# Patient Record
Sex: Female | Born: 1945 | Race: Black or African American | Hispanic: No | Smoking: Former smoker
Health system: Southern US, Community
[De-identification: ages and names within clinical notes are randomized; demographics above are authoritative.]

## PROBLEM LIST (undated history)

## (undated) DIAGNOSIS — N183 Chronic kidney disease, stage 3 unspecified: Secondary | ICD-10-CM

## (undated) DIAGNOSIS — M545 Low back pain, unspecified: Secondary | ICD-10-CM

## (undated) DIAGNOSIS — Z9289 Personal history of other medical treatment: Secondary | ICD-10-CM

## (undated) DIAGNOSIS — I10 Essential (primary) hypertension: Secondary | ICD-10-CM

## (undated) DIAGNOSIS — I509 Heart failure, unspecified: Secondary | ICD-10-CM

## (undated) DIAGNOSIS — I272 Pulmonary hypertension, unspecified: Secondary | ICD-10-CM

## (undated) DIAGNOSIS — Z9989 Dependence on other enabling machines and devices: Secondary | ICD-10-CM

## (undated) DIAGNOSIS — J449 Chronic obstructive pulmonary disease, unspecified: Secondary | ICD-10-CM

## (undated) DIAGNOSIS — M069 Rheumatoid arthritis, unspecified: Secondary | ICD-10-CM

## (undated) DIAGNOSIS — Z9981 Dependence on supplemental oxygen: Secondary | ICD-10-CM

## (undated) DIAGNOSIS — IMO0002 Reserved for concepts with insufficient information to code with codable children: Secondary | ICD-10-CM

## (undated) DIAGNOSIS — G4733 Obstructive sleep apnea (adult) (pediatric): Secondary | ICD-10-CM

## (undated) DIAGNOSIS — G8929 Other chronic pain: Secondary | ICD-10-CM

## (undated) DIAGNOSIS — I7101 Dissection of thoracic aorta: Secondary | ICD-10-CM

## (undated) DIAGNOSIS — M329 Systemic lupus erythematosus, unspecified: Secondary | ICD-10-CM

## (undated) DIAGNOSIS — D649 Anemia, unspecified: Secondary | ICD-10-CM

## (undated) DIAGNOSIS — M109 Gout, unspecified: Secondary | ICD-10-CM

## (undated) DIAGNOSIS — I719 Aortic aneurysm of unspecified site, without rupture: Secondary | ICD-10-CM

## (undated) DIAGNOSIS — Z9889 Other specified postprocedural states: Secondary | ICD-10-CM

## (undated) DIAGNOSIS — I71019 Dissection of thoracic aorta, unspecified: Secondary | ICD-10-CM

## (undated) DIAGNOSIS — A159 Respiratory tuberculosis unspecified: Secondary | ICD-10-CM

## (undated) HISTORY — PX: WISDOM TOOTH EXTRACTION: SHX21

## (undated) HISTORY — DX: Anemia, unspecified: D64.9

## (undated) HISTORY — DX: Rheumatoid arthritis, unspecified: M06.9

## (undated) HISTORY — DX: Dissection of thoracic aorta, unspecified: I71.019

## (undated) HISTORY — PX: HEMORRHOID SURGERY: SHX153

## (undated) HISTORY — DX: Gout, unspecified: M10.9

## (undated) HISTORY — DX: Other specified postprocedural states: Z98.890

## (undated) HISTORY — DX: Heart failure, unspecified: I50.9

## (undated) HISTORY — DX: Chronic kidney disease, stage 3 unspecified: N18.30

## (undated) HISTORY — DX: Chronic kidney disease, stage 3 (moderate): N18.3

## (undated) HISTORY — DX: Dissection of thoracic aorta: I71.01

## (undated) HISTORY — PX: BREAST EXCISIONAL BIOPSY: SUR124

## (undated) HISTORY — PX: BREAST LUMPECTOMY: SHX2

---

## 2003-08-07 HISTORY — PX: REPAIR OF ACUTE ASCENDING THORACIC AORTIC DISSECTION: SHX6323

## 2011-10-25 LAB — PULMONARY FUNCTION TEST

## 2011-11-08 LAB — PULMONARY FUNCTION TEST

## 2012-01-30 LAB — PULMONARY FUNCTION TEST

## 2012-08-12 ENCOUNTER — Emergency Department (HOSPITAL_COMMUNITY)
Admission: EM | Admit: 2012-08-12 | Discharge: 2012-08-12 | Disposition: A | Discharge status: Home / Self Care | Payer: MEDICARE | Attending: Emergency Medicine | Admitting: Emergency Medicine

## 2012-08-12 ENCOUNTER — Encounter (HOSPITAL_COMMUNITY): Payer: Self-pay | Admitting: Emergency Medicine

## 2012-08-12 DIAGNOSIS — Z79899 Other long term (current) drug therapy: Secondary | ICD-10-CM | POA: Insufficient documentation

## 2012-08-12 DIAGNOSIS — R040 Epistaxis: Secondary | ICD-10-CM | POA: Insufficient documentation

## 2012-08-12 DIAGNOSIS — Z860100 Personal history of colon polyps, unspecified: Secondary | ICD-10-CM | POA: Insufficient documentation

## 2012-08-12 DIAGNOSIS — Z7982 Long term (current) use of aspirin: Secondary | ICD-10-CM | POA: Insufficient documentation

## 2012-08-12 DIAGNOSIS — Z87448 Personal history of other diseases of urinary system: Secondary | ICD-10-CM | POA: Insufficient documentation

## 2012-08-12 DIAGNOSIS — I2789 Other specified pulmonary heart diseases: Secondary | ICD-10-CM | POA: Insufficient documentation

## 2012-08-12 DIAGNOSIS — R5381 Other malaise: Secondary | ICD-10-CM | POA: Insufficient documentation

## 2012-08-12 DIAGNOSIS — R51 Headache: Secondary | ICD-10-CM | POA: Insufficient documentation

## 2012-08-12 DIAGNOSIS — Z9981 Dependence on supplemental oxygen: Secondary | ICD-10-CM | POA: Insufficient documentation

## 2012-08-12 DIAGNOSIS — Z8679 Personal history of other diseases of the circulatory system: Secondary | ICD-10-CM | POA: Insufficient documentation

## 2012-08-12 HISTORY — DX: Essential (primary) hypertension: I10

## 2012-08-12 HISTORY — DX: Aortic aneurysm of unspecified site, without rupture: I71.9

## 2012-08-12 HISTORY — DX: Pulmonary hypertension, unspecified: I27.20

## 2012-08-12 NOTE — ED Notes (Signed)
Pt states that she now has a headache

## 2012-08-12 NOTE — ED Provider Notes (Signed)
History     CSN: [id number]  Arrival date & time 08/12/12  1840   First MD Initiated Contact with Patient 08/12/12 2008      Chief Complaint  Patient presents with  . Epistaxis  . Headache   HPI  History provided by the patient. Patient is a 67 year old female with history of hypertension, pulmonary hypertension who is on home O2 who presents with complaints of left nosebleed. Patient developed left nosebleed over the last 2-3 hours. She has had some intermittent bleeding but bleeding has persisted. She called EMS who attempted to help stop bleeding with pressure and Afrin without any improvement. Patient does have a baby aspirin daily with no other blood thinner medications. Patient occasionally uses humidified air for her nasal cannula at home but has not used this recently. She denies any cough, congestion and rhinorrhea symptoms. Patient also mentions having a past history of a polyp removed from her nostril 6 months ago. She denies any other complaints. Denies any other associated symptoms.    Past Medical History  Diagnosis Date  . Hypertension   . Pulmonary HTN   . Aortic aneurysm   . Kidney disorder     History reviewed. No pertinent past surgical history.  History reviewed. No pertinent family history.  History  Substance Use Topics  . Smoking status: Never Smoker   . Smokeless tobacco: Not on file  . Alcohol Use: No    OB History    Grav Para Term Preterm Abortions TAB SAB Ect Mult Living                  Review of Systems  Constitutional: Positive for fatigue.  Respiratory: Negative for shortness of breath.   All other systems reviewed and are negative.    Allergies  Review of patient's allergies indicates no known allergies.  Home Medications   Current Outpatient Rx  Name  Route  Sig  Dispense  Refill  . ALLOPURINOL 300 MG PO TABS   Oral   Take 300 mg by mouth daily.         . ASPIRIN 81 MG PO TABS   Oral   Take 81 mg by mouth daily.        . ATORVASTATIN CALCIUM 10 MG PO TABS   Oral   Take 10 mg by mouth daily.         . AZILSARTAN MEDOXOMIL 80 MG PO TABS   Oral   Take 1 tablet by mouth daily.         . BOSENTAN 125 MG PO TABS   Oral   Take 125 mg by mouth 2 (two) times daily.         [name] FERROUS GLUCONATE 325 MG PO TABS   Oral   Take 325 mg by mouth daily with breakfast.         . OMEGA-3 FATTY ACIDS 1000 MG PO CAPS   Oral   Take 2 g by mouth daily.         . FUROSEMIDE 40 MG PO TABS   Oral   Take 40 mg by mouth daily.         [name] HYDROXYCHLOROQUINE SULFATE 200 MG PO TABS   Oral   Take 200 mg by mouth 2 (two) times daily.         [name] METOLAZONE 2.5 MG PO TABS   Oral   Take 2.5 mg by mouth daily.         [name] METOPROLOL  SUCCINATE ER 100 MG PO TB24   Oral   Take 100 mg by mouth daily. Take with or immediately following a meal.         . POTASSIUM CHLORIDE ER 10 MEQ PO TBCR   Oral   Take 10 mEq by mouth 2 (two) times daily.           BP 107/54  Pulse 63  Temp 98.9 F (37.2 C) (Oral)  SpO2 94%  Physical Exam  Nursing note and vitals reviewed. Constitutional: She is oriented to person, place, and time. She appears well-developed and well-nourished. No distress.  HENT:  Head: Normocephalic.       Dry blood in left nostril with what appears to be small clot to the lateral anterior portion. No active bleeding.  Cardiovascular: Normal rate and regular rhythm.   Murmur heard. Pulmonary/Chest: Effort normal and breath sounds normal. No respiratory distress.  Neurological: She is alert and oriented to person, place, and time.  Skin: Skin is warm and dry.  Psychiatric: She has a normal mood and affect. Her behavior is normal.    ED Course  Procedures     1. Epistaxis   MDM  8:10 PM patient seen and evaluated. Patient resting comfortably currently without bleeding.        Angus Seller, [place] 08/12/12 2128

## 2012-08-12 NOTE — ED Notes (Signed)
Per EMS.  Pt began to have spontaneous epitaxis at home.  Pt surgery to remove polyp in L nare in June 2013, and bleeding seems to be coming from L nare.  Blood bright red and steady.  EMs estimates 100cc blood loss.  EMs gave 2 sprays of afrin with no relief.  Pt on 4L [place] 02 at home for pulmonary HTN.  Pt takes aspirin daily.

## 2012-08-13 NOTE — ED Provider Notes (Signed)
Medical screening examination/treatment/procedure(s) were performed by non-physician practitioner and as supervising physician I was immediately available for consultation/collaboration.  Tobin Chad, MD 08/13/12 [phone]

## 2012-11-06 ENCOUNTER — Ambulatory Visit (INDEPENDENT_AMBULATORY_CARE_PROVIDER_SITE_OTHER): Payer: Medicare Other | Admitting: Emergency Medicine

## 2012-11-06 ENCOUNTER — Ambulatory Visit (INDEPENDENT_AMBULATORY_CARE_PROVIDER_SITE_OTHER)
Admission: RE | Admit: 2012-11-06 | Discharge: 2012-11-06 | Disposition: A | Discharge status: Home / Self Care | Payer: MEDICARE | Source: Ambulatory Visit | Attending: Emergency Medicine | Admitting: Emergency Medicine

## 2012-11-06 ENCOUNTER — Encounter: Payer: Self-pay | Admitting: Emergency Medicine

## 2012-11-06 ENCOUNTER — Telehealth: Payer: Self-pay | Admitting: Emergency Medicine

## 2012-11-06 ENCOUNTER — Other Ambulatory Visit (INDEPENDENT_AMBULATORY_CARE_PROVIDER_SITE_OTHER): Payer: Medicare Other

## 2012-11-06 VITALS — BP 120/60 | HR 58 | Temp 99.0°F | Ht 64.0 in | Wt 210.6 lb

## 2012-11-06 DIAGNOSIS — IMO0002 Reserved for concepts with insufficient information to code with codable children: Secondary | ICD-10-CM

## 2012-11-06 DIAGNOSIS — N19 Unspecified kidney failure: Secondary | ICD-10-CM | POA: Insufficient documentation

## 2012-11-06 DIAGNOSIS — I2789 Other specified pulmonary heart diseases: Secondary | ICD-10-CM

## 2012-11-06 DIAGNOSIS — G4733 Obstructive sleep apnea (adult) (pediatric): Secondary | ICD-10-CM

## 2012-11-06 DIAGNOSIS — Z9989 Dependence on other enabling machines and devices: Secondary | ICD-10-CM | POA: Insufficient documentation

## 2012-11-06 DIAGNOSIS — J449 Chronic obstructive pulmonary disease, unspecified: Secondary | ICD-10-CM

## 2012-11-06 DIAGNOSIS — M069 Rheumatoid arthritis, unspecified: Secondary | ICD-10-CM

## 2012-11-06 DIAGNOSIS — I2721 Secondary pulmonary arterial hypertension: Secondary | ICD-10-CM | POA: Insufficient documentation

## 2012-11-06 DIAGNOSIS — N289 Disorder of kidney and ureter, unspecified: Secondary | ICD-10-CM

## 2012-11-06 DIAGNOSIS — I10 Essential (primary) hypertension: Secondary | ICD-10-CM

## 2012-11-06 HISTORY — DX: Rheumatoid arthritis, unspecified: M06.9

## 2012-11-06 LAB — HEPATIC FUNCTION PANEL
ALT: 13 U/L (ref 0–35)
Alkaline Phosphatase: 60 U/L (ref 39–117)
Bilirubin, Direct: 0.1 mg/dL (ref 0.0–0.3)
Total Bilirubin: 0.4 mg/dL (ref 0.3–1.2)
Total Protein: 7 g/dL (ref 6.0–8.3)

## 2012-11-06 LAB — BASIC METABOLIC PANEL
CO2: 29 mEq/L (ref 19–32)
Chloride: 105 mEq/L (ref 96–112)
Potassium: 3.9 mEq/L (ref 3.5–5.1)
Sodium: 137 mEq/L (ref 135–145)

## 2012-11-06 NOTE — Assessment & Plan Note (Addendum)
-   will refill bosentan and revatio, fill paperwork for access program - LFT's monthly - need to get her old records from [place] before deciding on what testing to do >> R heart cath, TTE, CT chest, etc.  - CXR today to assess for volume overload - needs BMP

## 2012-11-06 NOTE — Assessment & Plan Note (Signed)
-   will get [facility] records. Continue same CPAP for now

## 2012-11-06 NOTE — Progress Notes (Signed)
Subjective:    Patient ID: April Mueller, female    DOB: [birth year], 67 y.o.   MRN: [id number]  HPI 67 yo, former smoker (28 pk-yrs), hx of HTN, OSA on CPAP, Rheumatoid arthritis on plaquenil. Hx AAA 2005 c/b ARF and 3 months of HD (now off). 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 She moved back to [place] from [place] in 8/13, is referred by Dr Duane Lope for [facility] that has been managed on bosentan 125mg  bid + sildenifil 20mg  tid, supplemental O2 4L/min rest, up to 5L/min exertion.   She reports a worsening in her dyspnea over the last 3 months. No CP. No wheeze or cough. She is able to exert some, able to walk when she takes her time. Her nose is congested. She had epistaxis in Jan, went to [place]. Has recurred some, not currently.   She has had PFT, CT scan chest, R heart cath, echocardiograms in [place].    Review of Systems  Constitutional: Negative for fever and unexpected weight change.  HENT: Positive for nosebleeds. Negative for ear pain, congestion, sore throat, rhinorrhea, sneezing, trouble swallowing, dental problem, postnasal drip and sinus pressure.   Eyes: Negative for redness and itching.  Respiratory: Positive for shortness of breath. Negative for cough, chest tightness and wheezing.   Cardiovascular: Positive for palpitations and leg swelling.  Gastrointestinal: Positive for diarrhea. Negative for nausea and vomiting.  Genitourinary: Negative for dysuria.  Musculoskeletal: Negative for joint swelling.  Skin: Negative for rash.  Neurological: Negative for headaches.  Hematological: Bruises/bleeds easily.  Psychiatric/Behavioral: Negative for dysphoric mood. The patient is not nervous/anxious.     Past Medical History  Diagnosis Date  . Hypertension   . Pulmonary HTN   . Aortic aneurysm   . Kidney disorder   . Sleep apnea     wears CPAP     Family History  Problem Relation Age of Onset  . Heart attack Mother   . Cancer Father     prostate  . Diabetes Brother       History   Social History  . Marital Status: Married    Spouse Name: N/A    Number of Children: 1  . Years of Education: N/A   Occupational History  . Disability     Office Work   Social History Main Topics  . Smoking status: Former Smoker -- 2.00 packs/day for 14 years    Types: Cigarettes    Quit date: 08/09/2003  . Smokeless tobacco: Never Used  . Alcohol Use: No  . Drug Use: No  . Sexually Active: Not on file   Other Topics Concern  . Not on file   Social History Narrative  . No narrative on file     No Known Allergies   Outpatient Prescriptions Prior to Visit  Medication Sig Dispense Refill  . allopurinol (ZYLOPRIM) 300 MG tablet Take 300 mg by mouth daily.      [name] aspirin 81 MG tablet Take 81 mg by mouth daily.      [name] atorvastatin (LIPITOR) 10 MG tablet Take 10 mg by mouth daily.      . Azilsartan Medoxomil (EDARBI) 80 MG TABS Take 1 tablet by mouth daily.      [name] bosentan (TRACLEER) 125 MG tablet Take 125 mg by mouth 2 (two) times daily.      . ferrous gluconate (FERGON) 325 MG tablet Take 325 mg by mouth daily with breakfast.      . fish oil-omega-3  fatty acids 1000 MG capsule Take 2 g by mouth daily.      . furosemide (LASIX) 40 MG tablet Take 40 mg by mouth daily.      . hydroxychloroquine (PLAQUENIL) 200 MG tablet Take 200 mg by mouth 2 (two) times daily.      . metolazone (ZAROXOLYN) 2.5 MG tablet Take 2.5 mg by mouth daily.      . metoprolol succinate (TOPROL-XL) 100 MG 24 hr tablet Take 100 mg by mouth daily. Take with or immediately following a meal.      . potassium chloride (K-DUR) 10 MEQ tablet Take 10 mEq by mouth 2 (two) times daily.       No facility-administered medications prior to visit.         Objective:   Physical Exam Filed Vitals:   11/06/12 1318  BP: 120/60  Pulse: 58  Temp: 99 F (37.2 C)   Gen: Pleasant, well-nourished, in no distress,  normal affect, in a wheelchair  ENT: No lesions,  mouth clear,  oropharynx clear, no  postnasal drip  Neck: No JVD, no TMG, no carotid bruits  Lungs: No use of accessory muscles, distant, clear without rales or rhonchi  Cardiovascular: RRR, heart sounds normal, no murmur or gallops, no peripheral edema  Musculoskeletal: No deformities, no cyanosis or clubbing, 1-2+ edema  Neuro: alert, non focal  Skin: Warm, no lesions or rashes       Assessment & Plan:  Secondary pulmonary hypertension - will refill bosentan and revatio, fill paperwork for access program - LFT's monthly - need to get her old records from [place] before deciding on what testing to do >> R heart cath, TTE, CT chest, etc.  - CXR today to assess for volume overload - needs BMP   COPD (chronic obstructive pulmonary disease) - will need full PFT  - will likely start BD's next time   Renal insufficiency - needs BMP  OSA on CPAP - will get [facility] records. Continue same CPAP for now

## 2012-11-06 NOTE — Assessment & Plan Note (Signed)
-   needs BMP

## 2012-11-06 NOTE — Telephone Encounter (Signed)
I spoke with Margaretha Glassing. They are going to be faxing over paperwork for RB to sign since he will be following pt for her tracleer. They will fax this to [phone]. Will forward to [facility] so she is aware.

## 2012-11-06 NOTE — Telephone Encounter (Addendum)
I have received form, RB and patient have signed it and I will fax back to Tracleer Enrollment for Patients and Prescribers @ [phone]

## 2012-11-06 NOTE — Patient Instructions (Addendum)
Please continue your oxygen, Tracleer and revatio. We will do the paperwork and prescriptions to continue these medications [facility] and CXR today We will obtain your records from [facility] Follow with Dr Delton Coombes in 1 month

## 2012-11-06 NOTE — Assessment & Plan Note (Signed)
-   will need full PFT  - will likely start BD's next time

## 2012-11-17 ENCOUNTER — Telehealth: Payer: Self-pay | Admitting: Emergency Medicine

## 2012-11-17 NOTE — Telephone Encounter (Signed)
Received records from [facility] a total of 89 pages. Sent up to Dr. Delton Coombes to Review 11/17/2012  asw

## 2012-11-18 ENCOUNTER — Telehealth: Payer: Self-pay | Admitting: Emergency Medicine

## 2012-11-18 NOTE — Telephone Encounter (Signed)
Pharmacy requesting rx   tracleer 125mg  takes 1 tablet bid. #60 x 6 No Known Allergies Dr bryum please advise

## 2012-11-19 NOTE — Telephone Encounter (Signed)
Lauren, please give some insight on this; Was a Rx going with the paperwork??? Thanks.

## 2012-11-19 NOTE — Telephone Encounter (Signed)
Yes - we will be filling this. I believe she has already started filling out the paperwork for both tracleer and revatio Leotis Shames can confirm this)

## 2012-11-23 NOTE — Telephone Encounter (Signed)
Message being forwarded to Dr. Delton Coombes. I do not know why Dr. Marchelle Gearing which is myself got this message

## 2012-11-24 ENCOUNTER — Telehealth: Payer: Self-pay | Admitting: Emergency Medicine

## 2012-11-24 MED ORDER — BOSENTAN 125 MG PO TABS: 125.0000 mg | ORAL_TABLET | Freq: Two times a day (BID) | ORAL | Status: DC

## 2012-11-24 NOTE — Telephone Encounter (Signed)
Spoke with lauren reprinted paper work and faxed to [phone] Sent rx to pharmacy .

## 2012-11-24 NOTE — Telephone Encounter (Signed)
ATC Curas[employer]s to verify msg went to VM Will route to my box to ensure f/u

## 2012-11-25 NOTE — Telephone Encounter (Signed)
Medication Rx already sent No paperwork needed!

## 2012-11-25 NOTE — Telephone Encounter (Signed)
Lauren, what is status of this? Do I need to do any paperwork, etc?

## 2012-11-26 ENCOUNTER — Other Ambulatory Visit: Payer: Self-pay | Admitting: Emergency Medicine

## 2012-11-26 MED ORDER — SILDENAFIL CITRATE 20 MG PO TABS: 20.0000 mg | ORAL_TABLET | Freq: Three times a day (TID) | ORAL | Status: DC

## 2012-11-26 NOTE — Telephone Encounter (Signed)
Fax received from [facility] for patient new Rx fill for sildenafil. Rx sent for 90 days as requested, per pharmacy if pt insurance will not cover they will automatically send as 30 day. Nothing further needed.

## 2012-12-11 ENCOUNTER — Encounter: Payer: Self-pay | Admitting: Emergency Medicine

## 2012-12-11 ENCOUNTER — Ambulatory Visit (INDEPENDENT_AMBULATORY_CARE_PROVIDER_SITE_OTHER): Payer: Medicare Other | Admitting: Emergency Medicine

## 2012-12-11 ENCOUNTER — Other Ambulatory Visit (INDEPENDENT_AMBULATORY_CARE_PROVIDER_SITE_OTHER): Payer: Medicare Other

## 2012-12-11 VITALS — BP 132/82 | HR 60 | Ht 64.0 in | Wt 213.0 lb

## 2012-12-11 DIAGNOSIS — G4733 Obstructive sleep apnea (adult) (pediatric): Secondary | ICD-10-CM

## 2012-12-11 DIAGNOSIS — I2789 Other specified pulmonary heart diseases: Secondary | ICD-10-CM

## 2012-12-11 DIAGNOSIS — J449 Chronic obstructive pulmonary disease, unspecified: Secondary | ICD-10-CM

## 2012-12-11 DIAGNOSIS — IMO0002 Reserved for concepts with insufficient information to code with codable children: Secondary | ICD-10-CM

## 2012-12-11 LAB — HEPATIC FUNCTION PANEL
ALT: 12 U/L (ref 0–35)
AST: 15 U/L (ref 0–37)
Albumin: 3.4 g/dL — ABNORMAL LOW (ref 3.5–5.2)
Total Bilirubin: 0.6 mg/dL (ref 0.3–1.2)

## 2012-12-11 LAB — BASIC METABOLIC PANEL
BUN: 31 mg/dL — ABNORMAL HIGH (ref 6–23)
Chloride: 105 mEq/L (ref 96–112)
Creatinine, Ser: 1.4 mg/dL — ABNORMAL HIGH (ref 0.4–1.2)
GFR: 46.98 mL/min — ABNORMAL LOW (ref >60.00)
Glucose, Bld: 87 mg/dL (ref 70–99)

## 2012-12-11 MED ORDER — FUROSEMIDE 40 MG PO TABS: 40.0000 mg | ORAL_TABLET | Freq: Every day | ORAL | Status: DC

## 2012-12-11 NOTE — Progress Notes (Signed)
Subjective:    Patient ID: April Mueller, female    DOB: [birth year], 67 y.o.   MRN: [id number]  HPI 67 yo, former smoker (28 pk-yrs), hx of HTN, OSA on CPAP, Rheumatoid arthritis on plaquenil. Hx AAA 2005 c/b ARF and 3 months of HD (now off). 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 She moved back to [place] from [place] in 8/13, is referred by Dr Duane Lope for [facility] that has been managed on bosentan 125mg  bid + sildenifil 20mg  tid, supplemental O2 4L/min rest, up to 5L/min exertion.   She reports a worsening in her dyspnea over the last 3 months. No CP. No wheeze or cough. She is able to exert some, able to walk when she takes her time. Her nose is congested. She had epistaxis in Jan, went to [place]. Has recurred some, not currently.   She has had PFT, CT scan chest, R heart cath, echocardiograms in [place] > reviewed 12/11/12   ROV 12/11/12 -- f/u for COPD, RA, OSA and secondary PAH. 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 She is planning to see Dr Nickola Major for Rheum, hasn't done so yet. She has been clinically stable. Using lasix 40 daily, increased zaroxolyn on her own to about 3 x a week. Has been doing this for a week.    Review of Systems  Constitutional: Negative for fever and unexpected weight change.  HENT: Positive for nosebleeds. Negative for ear pain, congestion, sore throat, rhinorrhea, sneezing, trouble swallowing, dental problem, postnasal drip and sinus pressure.   Eyes: Negative for redness and itching.  Respiratory: Positive for shortness of breath. Negative for cough, chest tightness and wheezing.   Cardiovascular: Positive for palpitations and leg swelling.  Gastrointestinal: Positive for diarrhea. Negative for nausea and vomiting.  Genitourinary: Negative for dysuria.  Musculoskeletal: Negative for joint swelling.  Skin: Negative for rash.  Neurological: Negative for headaches.  Hematological: Bruises/bleeds easily.   Psychiatric/Behavioral: Negative for dysphoric mood. The patient is not nervous/anxious.     Past Medical History  Diagnosis Date  . Hypertension   . Pulmonary HTN   . Aortic aneurysm   . Kidney disorder   . Sleep apnea     wears CPAP     Family History  Problem Relation Age of Onset  . Heart attack Mother   . Cancer Father     prostate  . Diabetes Brother      History   Social History  . Marital Status: Married    Spouse Name: N/A    Number of Children: 1  . Years of Education: N/A   Occupational History  . Disability     Office Work   Social History Main Topics  . Smoking status: Former Smoker -- 2.00 packs/day for 14 years    Types: Cigarettes    Quit date: 08/09/2003  . Smokeless tobacco: Never Used  . Alcohol Use: No  . Drug Use: No  . Sexually Active: Not on file   Other Topics Concern  . Not on file   Social History Narrative  . No narrative on file     No Known Allergies   Outpatient Prescriptions Prior to Visit  Medication Sig Dispense Refill  . allopurinol (ZYLOPRIM) 300 MG tablet Take 300 mg by mouth daily.      [name] ALPRAZolam (XANAX) 0.25 MG tablet Take 0.25 mg by mouth as needed for sleep.      [name]  aspirin 81 MG tablet Take 81 mg by mouth daily.      [name] atorvastatin (LIPITOR) 10 MG tablet Take 10 mg by mouth daily.      . Azilsartan Medoxomil (EDARBI) 80 MG TABS Take 1 tablet by mouth daily.      [name] bosentan (TRACLEER) 125 MG tablet Take 1 tablet (125 mg total) by mouth 2 (two) times daily.  60 tablet  5  . ferrous gluconate (FERGON) 325 MG tablet Take 325 mg by mouth daily with breakfast.      . fish oil-omega-3 fatty acids 1000 MG capsule Take 1,200 g by mouth daily.       . hydroxychloroquine (PLAQUENIL) 200 MG tablet Take 200 mg by mouth 2 (two) times daily.      . metolazone (ZAROXOLYN) 2.5 MG tablet Take 2.5 mg by mouth 3 (three) times a week.       . metoprolol succinate (TOPROL-XL) 100 MG 24 hr tablet Take 100 mg by mouth 2 (two) times  daily. Take with or immediately following a meal.      . potassium chloride (K-DUR) 10 MEQ tablet Take 10 mEq by mouth 2 (two) times daily.      . sildenafil (REVATIO) 20 MG tablet Take 1 tablet (20 mg total) by mouth 3 (three) times daily.  270 tablet  3  . furosemide (LASIX) 40 MG tablet Take 40 mg by mouth daily.       No facility-administered medications prior to visit.         Objective:   Physical Exam Filed Vitals:   12/11/12 1329  BP: 132/82  Pulse: 60   Gen: Pleasant, well-nourished, in no distress,  normal affect  ENT: No lesions,  mouth clear,  oropharynx clear, no postnasal drip  Neck: No JVD, no TMG, no carotid bruits  Lungs: No use of accessory muscles, distant, clear without rales or rhonchi  Cardiovascular: RRR, heart sounds normal, no murmur or gallops, no peripheral edema  Musculoskeletal: No deformities, no cyanosis or clubbing, 1+ edema  Neuro: alert, non focal  Skin: Warm, no lesions or rashes       Assessment & Plan:  Secondary pulmonary hypertension Need to repeat TTE locally. I haven;t received R heart cath data or TTE's from [place] - continue revatio and bosentan - continue lasix daily and zaroxolyn tiw - check BMP + LFT today on the increased diuretic regimen.  - rov 1 month  OSA on CPAP Insure w [employer] that she is set on 15cmH2O based on download data from 2012 Insure that she is getting maintenance twice a yr  COPD (chronic obstructive pulmonary disease) continue current BDs

## 2012-12-11 NOTE — Assessment & Plan Note (Signed)
Need to repeat TTE locally. I haven;t received R heart cath data or TTE's from [place] - continue revatio and bosentan - continue lasix daily and zaroxolyn tiw - check BMP + LFT today on the increased diuretic regimen.  - rov 1 month

## 2012-12-11 NOTE — Assessment & Plan Note (Signed)
Insure w [employer] that she is set on 15cmH2O based on download data from 2012 Insure that she is getting maintenance twice a yr

## 2012-12-11 NOTE — Progress Notes (Signed)
Quick Note:  Spoke with patient, made her aware of results/recs per RB below Verbalized understanding and nothing further needed at this time. ______

## 2012-12-11 NOTE — Patient Instructions (Addendum)
Please continue your current medications as you are taking them, including the zaroxolyn 3 times a week Wear your oxygen at all times.  Continue CPAP every night. We will ask [employer] to insure your pressure is set correctly and to check your machine for possible need for repairs.  We will perform an echocardiogram  Follow with Dr Delton Coombes in 4-6 weeks to review the echocardiogram Lab work today

## 2012-12-11 NOTE — Assessment & Plan Note (Signed)
-   continue current BD's 

## 2012-12-17 ENCOUNTER — Other Ambulatory Visit: Payer: Self-pay

## 2012-12-17 ENCOUNTER — Ambulatory Visit (HOSPITAL_COMMUNITY): Payer: MEDICARE | Attending: Emergency Medicine | Admitting: Radiology

## 2012-12-17 DIAGNOSIS — I10 Essential (primary) hypertension: Secondary | ICD-10-CM | POA: Insufficient documentation

## 2012-12-17 DIAGNOSIS — R0609 Other forms of dyspnea: Secondary | ICD-10-CM | POA: Insufficient documentation

## 2012-12-17 DIAGNOSIS — I2789 Other specified pulmonary heart diseases: Secondary | ICD-10-CM | POA: Insufficient documentation

## 2012-12-17 DIAGNOSIS — G4733 Obstructive sleep apnea (adult) (pediatric): Secondary | ICD-10-CM | POA: Insufficient documentation

## 2012-12-17 DIAGNOSIS — J449 Chronic obstructive pulmonary disease, unspecified: Secondary | ICD-10-CM | POA: Insufficient documentation

## 2012-12-17 DIAGNOSIS — Z9981 Dependence on supplemental oxygen: Secondary | ICD-10-CM | POA: Insufficient documentation

## 2012-12-17 DIAGNOSIS — R002 Palpitations: Secondary | ICD-10-CM | POA: Insufficient documentation

## 2012-12-17 DIAGNOSIS — R609 Edema, unspecified: Secondary | ICD-10-CM | POA: Insufficient documentation

## 2012-12-17 DIAGNOSIS — J4489 Other specified chronic obstructive pulmonary disease: Secondary | ICD-10-CM | POA: Insufficient documentation

## 2012-12-17 DIAGNOSIS — I059 Rheumatic mitral valve disease, unspecified: Secondary | ICD-10-CM | POA: Insufficient documentation

## 2012-12-17 DIAGNOSIS — IMO0002 Reserved for concepts with insufficient information to code with codable children: Secondary | ICD-10-CM

## 2012-12-17 DIAGNOSIS — I2589 Other forms of chronic ischemic heart disease: Secondary | ICD-10-CM

## 2012-12-17 DIAGNOSIS — R0989 Other specified symptoms and signs involving the circulatory and respiratory systems: Secondary | ICD-10-CM | POA: Insufficient documentation

## 2012-12-17 DIAGNOSIS — E669 Obesity, unspecified: Secondary | ICD-10-CM | POA: Insufficient documentation

## 2012-12-17 NOTE — Progress Notes (Signed)
Echocardiogram performed.  

## 2012-12-31 ENCOUNTER — Telehealth: Payer: Self-pay | Admitting: Emergency Medicine

## 2012-12-31 NOTE — Progress Notes (Signed)
Quick Note:  ATC patient, no answer [id number] ______ 

## 2012-12-31 NOTE — Telephone Encounter (Signed)
Notes Recorded by Leslye Peer, MD on 12/31/2012 at 2:10 PM Please inform the patient that her echo confirms pulmonary hypertension with an estimated PASP of 56 mmHg. It also shows that one of her valves is leaky. We will compare to her studies from [place], discuss further at her follow up visit. Thanks   Pt aware of results and next OV.

## 2013-01-07 ENCOUNTER — Telehealth: Payer: Self-pay | Admitting: Emergency Medicine

## 2013-01-07 NOTE — Progress Notes (Signed)
Quick Note:  ATC patient, no answer [id number] ______ 

## 2013-01-07 NOTE — Telephone Encounter (Signed)
Notes Recorded by Leslye Peer, MD on 12/31/2012 at 2:10 PM Please inform the patient that her echo confirms pulmonary hypertension with an estimated PASP of 56 mmHg. It also shows that one of her valves is leaky. We will compare to her studies from [place], discuss further at her follow up visit. Thanks   Pt was made aware 12/31/12. Nothing further was needed

## 2013-01-12 ENCOUNTER — Telehealth: Payer: Self-pay | Admitting: Emergency Medicine

## 2013-01-12 NOTE — Telephone Encounter (Signed)
Pt aware we received paper work today and Dr Delton Coombes is in office 6/10. Once he signs paper work we will fax them. Pt verbally understood Nothing further needed.

## 2013-01-13 NOTE — Telephone Encounter (Signed)
Paperwork signed.

## 2013-01-14 ENCOUNTER — Telehealth: Payer: Self-pay | Admitting: Emergency Medicine

## 2013-01-14 NOTE — Telephone Encounter (Signed)
That fax # on the form is not the same as the fax # that has been requested to fax to Form has been re faxed to [phone] Original form placed in RB scan folder Nothing further

## 2013-01-14 NOTE — Telephone Encounter (Signed)
Lauren do you still have form so we can refax as per sarah they never received this. thanks

## 2013-01-14 NOTE — Telephone Encounter (Signed)
Form has been faxed. Patient is aware. Nothing further needed at this time

## 2013-01-20 ENCOUNTER — Ambulatory Visit (INDEPENDENT_AMBULATORY_CARE_PROVIDER_SITE_OTHER): Payer: MEDICARE | Admitting: Emergency Medicine

## 2013-01-20 ENCOUNTER — Other Ambulatory Visit (INDEPENDENT_AMBULATORY_CARE_PROVIDER_SITE_OTHER): Payer: MEDICARE

## 2013-01-20 ENCOUNTER — Encounter: Payer: Self-pay | Admitting: Emergency Medicine

## 2013-01-20 VITALS — BP 120/80 | HR 55 | Temp 99.5°F | Ht 64.5 in | Wt 212.0 lb

## 2013-01-20 DIAGNOSIS — IMO0002 Reserved for concepts with insufficient information to code with codable children: Secondary | ICD-10-CM

## 2013-01-20 DIAGNOSIS — G4733 Obstructive sleep apnea (adult) (pediatric): Secondary | ICD-10-CM

## 2013-01-20 DIAGNOSIS — Z9989 Dependence on other enabling machines and devices: Secondary | ICD-10-CM

## 2013-01-20 DIAGNOSIS — J449 Chronic obstructive pulmonary disease, unspecified: Secondary | ICD-10-CM

## 2013-01-20 DIAGNOSIS — I2789 Other specified pulmonary heart diseases: Secondary | ICD-10-CM

## 2013-01-20 DIAGNOSIS — M069 Rheumatoid arthritis, unspecified: Secondary | ICD-10-CM

## 2013-01-20 LAB — HEPATIC FUNCTION PANEL
Albumin: 3.4 g/dL — ABNORMAL LOW (ref 3.5–5.2)
Alkaline Phosphatase: 61 U/L (ref 39–117)
Total Protein: 7.1 g/dL (ref 6.0–8.3)

## 2013-01-20 LAB — BASIC METABOLIC PANEL
BUN: 26 mg/dL — ABNORMAL HIGH (ref 6–23)
CO2: 29 mEq/L (ref 19–32)
Calcium: 10.4 mg/dL (ref 8.4–10.5)
Creatinine, Ser: 1.3 mg/dL — ABNORMAL HIGH (ref 0.4–1.2)
GFR: 53.8 mL/min — ABNORMAL LOW (ref >60.00)
Glucose, Bld: 75 mg/dL (ref 70–99)
Sodium: 144 mEq/L (ref 135–145)

## 2013-01-20 NOTE — Progress Notes (Signed)
Subjective:    Patient ID: April Mueller, female    DOB: [birth year], 67 y.o.   MRN: [id number]  HPI 67 yo, former smoker (28 pk-yrs), hx of HTN, OSA on CPAP, Rheumatoid arthritis on plaquenil. Hx AAA 2005 c/b ARF and 3 months of HD (now off). 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 She moved back to [place] from [place] in 8/13, is referred by Dr Duane Lope for [facility] that has been managed on bosentan 125mg  bid + sildenifil 20mg  tid, supplemental O2 4L/min rest, up to 5L/min exertion.   She reports a worsening in her dyspnea over the last 3 months. No CP. No wheeze or cough. She is able to exert some, able to walk when she takes her time. Her nose is congested. She had epistaxis in Jan, went to [place]. Has recurred some, not currently.   She has had PFT, CT scan chest, R heart cath, echocardiograms in [place] > reviewed 12/11/12   ROV 12/11/12 -- f/u for COPD, RA, OSA and secondary PAH. 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 She is planning to see Dr Nickola Major for Rheum, hasn't done so yet. She has been clinically stable. Using lasix 40 daily, increased zaroxolyn on her own to about 3 x a week. Has been doing this for a week.   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 S/p TTE  As below > PASP 56, L atrial enlargement w mild to mod MR.    Review of Systems  Constitutional: Negative for fever and unexpected weight change.  HENT: Positive for nosebleeds. Negative for ear pain, congestion, sore throat, rhinorrhea, sneezing, trouble swallowing, dental problem, postnasal drip and sinus pressure.   Eyes: Negative for redness and itching.  Respiratory: Positive for shortness of breath. Negative for cough, chest tightness and wheezing.   Cardiovascular: Positive for palpitations and leg swelling.  Gastrointestinal: Positive for diarrhea. Negative for nausea and vomiting.  Genitourinary: Negative for dysuria.   Musculoskeletal: Negative for joint swelling.  Skin: Negative for rash.  Neurological: Negative for headaches.  Hematological: Bruises/bleeds easily.  Psychiatric/Behavioral: Negative for dysphoric mood. The patient is not nervous/anxious.    BMET    Component Value Date/Time   NA 142 12/11/2012 1430   K 3.7 12/11/2012 1430   CL 105 12/11/2012 1430   CO2 31 12/11/2012 1430   GLUCOSE 87 12/11/2012 1430   BUN 31* 12/11/2012 1430   CREATININE 1.4* 12/11/2012 1430   CALCIUM 9.6 12/11/2012 1430   Hepatic Function Panel     Component Value Date/Time   PROT 7.0 12/11/2012 1430   ALBUMIN 3.4* 12/11/2012 1430   AST 15 12/11/2012 1430   ALT 12 12/11/2012 1430   ALKPHOS 59 12/11/2012 1430   BILITOT 0.6 12/11/2012 1430   BILIDIR 0.1 12/11/2012 1430       Objective:   Physical Exam Filed Vitals:   01/20/13 1329  BP: 120/80  Pulse: 55  Temp: 99.5 F (37.5 C)   Gen: Pleasant, well-nourished, in no distress,  normal affect  ENT: No lesions,  mouth clear,  oropharynx clear, no postnasal drip  Neck: No JVD, no TMG, no carotid bruits  Lungs: No use of accessory muscles, distant, clear without rales or rhonchi  Cardiovascular: RRR, heart sounds normal, no murmur or gallops, no peripheral edema  Musculoskeletal: No deformities, no cyanosis or clubbing, 1+ edema  Neuro: alert,  non focal  Skin: Warm, no lesions or rashes  Study Conclusions   12/17/12 --  - Left ventricle: The cavity size was normal. Wall thickness was normal. Systolic function was normal. The estimated ejection fraction was in the range of 55% to 60%. Wall motion was normal; there were no regional wall motion abnormalities. - Mitral valve: MR is eccentric and directed posteriorl somewhat hard to judge extent Mildly calcified annulus. Mildly thickened leaflets . Mild to moderate regurgitation. - Left atrium: The atrium was mildly dilated. - Right ventricle: The cavity size was mildly dilated. - Right atrium: The atrium was mildly  dilated. - Atrial septum: The septum bowed from left to right, consistent with increased left atrial pressure. - Pulmonary arteries: PA peak pressure: 56mm Hg (S).  TTE from [facility]  >>      Assessment & Plan:  OSA on CPAP Continue current CPAP and review download info  Rheumatoid arthritis(714.0) With basilar infiltrates on CXR.  - check CT scan chest and review next time.   Secondary pulmonary hypertension Multifactorial. L atrial enlargement and MR concerning, as is overall volume status.  - will ask her to see Dr Gala Romney regarding the impact, issues surrounding her MV and HTN, diuretics.  - ? At some point she would need MV sgy (does not appear to be the case currently) - continue tracleer + revatio, consider changing to qd regimen at some point - LFT today  COPD (chronic obstructive pulmonary disease) She has never benefited from BD's before. Discussed w her today. Suspect we will need to retry this in the future. Her spirometry is consistent w severe obstruction - continue O2 as ordered

## 2013-01-20 NOTE — Assessment & Plan Note (Signed)
She has never benefited from BD's before. Discussed w her today. Suspect we will need to retry this in the future. Her spirometry is consistent w severe obstruction - continue O2 as ordered

## 2013-01-20 NOTE — Assessment & Plan Note (Signed)
With basilar infiltrates on CXR.  - check CT scan chest and review next time.

## 2013-01-20 NOTE — Assessment & Plan Note (Signed)
Multifactorial. L atrial enlargement and MR concerning, as is overall volume status.  - will ask her to see Dr Gala Romney regarding the impact, issues surrounding her MV and HTN, diuretics.  - ? At some point she would need MV sgy (does not appear to be the case currently) - continue tracleer + revatio, consider changing to qd regimen at some point - LFT today

## 2013-01-20 NOTE — Patient Instructions (Signed)
Return in 4 months after CT scan

## 2013-01-20 NOTE — Assessment & Plan Note (Signed)
Continue current CPAP and review download info

## 2013-01-23 ENCOUNTER — Ambulatory Visit (INDEPENDENT_AMBULATORY_CARE_PROVIDER_SITE_OTHER)
Admission: RE | Admit: 2013-01-23 | Discharge: 2013-01-23 | Disposition: A | Discharge status: Home / Self Care | Payer: MEDICARE | Source: Ambulatory Visit | Attending: Emergency Medicine | Admitting: Emergency Medicine

## 2013-01-23 DIAGNOSIS — M069 Rheumatoid arthritis, unspecified: Secondary | ICD-10-CM

## 2013-02-03 ENCOUNTER — Other Ambulatory Visit: Payer: Self-pay | Admitting: Emergency Medicine

## 2013-02-03 ENCOUNTER — Telehealth: Payer: Self-pay | Admitting: Emergency Medicine

## 2013-02-03 NOTE — Telephone Encounter (Signed)
[facility]-no one available at this time to help (pharmacy side) will need to try the call again tomorrow.

## 2013-02-04 MED ORDER — BOSENTAN 125 MG PO TABS: 125.0000 mg | ORAL_TABLET | Freq: Two times a day (BID) | ORAL | Status: DC

## 2013-02-04 NOTE — Telephone Encounter (Signed)
I called and gave VO for tracleer RX. Nothing further was needed.

## 2013-02-05 ENCOUNTER — Ambulatory Visit (HOSPITAL_COMMUNITY)
Admission: RE | Admit: 2013-02-05 | Discharge: 2013-02-05 | Disposition: A | Discharge status: Home / Self Care | Payer: MEDICARE | Source: Ambulatory Visit | Attending: Internal Medicine | Admitting: Internal Medicine

## 2013-02-05 ENCOUNTER — Encounter (HOSPITAL_COMMUNITY): Payer: Self-pay

## 2013-02-05 VITALS — BP 110/60 | HR 58 | Wt 219.0 lb

## 2013-02-05 DIAGNOSIS — IMO0002 Reserved for concepts with insufficient information to code with codable children: Secondary | ICD-10-CM

## 2013-02-05 DIAGNOSIS — I2789 Other specified pulmonary heart diseases: Secondary | ICD-10-CM | POA: Insufficient documentation

## 2013-02-05 LAB — COMPREHENSIVE METABOLIC PANEL
ALT: 13 U/L (ref 0–35)
AST: 17 U/L (ref 0–37)
Albumin: 3.4 g/dL — ABNORMAL LOW (ref 3.5–5.2)
Alkaline Phosphatase: 66 U/L (ref 39–117)
CO2: 30 mEq/L (ref 19–32)
Chloride: 103 mEq/L (ref 96–112)
Creatinine, Ser: 1.37 mg/dL — ABNORMAL HIGH (ref 0.50–1.10)
GFR calc non Af Amer: 40 mL/min — ABNORMAL LOW (ref >90)
Potassium: 3.8 mEq/L (ref 3.5–5.1)
Sodium: 141 mEq/L (ref 135–145)
Total Bilirubin: 0.3 mg/dL (ref 0.3–1.2)

## 2013-02-05 MED ORDER — METOPROLOL SUCCINATE ER 100 MG PO TB24: 100.0000 mg | ORAL_TABLET | Freq: Two times a day (BID) | ORAL | Status: DC

## 2013-02-05 MED ORDER — ATORVASTATIN CALCIUM 10 MG PO TABS: 10.0000 mg | ORAL_TABLET | Freq: Every day | ORAL | Status: DC

## 2013-02-05 MED ORDER — AZILSARTAN MEDOXOMIL 80 MG PO TABS: 1.0000 | ORAL_TABLET | Freq: Every day | ORAL | Status: DC

## 2013-02-05 NOTE — Patient Instructions (Addendum)
Stop Revatio  We will contact you in 2 months to schedule your next appointment.

## 2013-02-05 NOTE — Assessment & Plan Note (Signed)
I have reviewed all her previous studies including PFTs, CT scan and echocardiogram personally. I agree with Dr. 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 She states she has had very little benefit from Revatio so I have taken the liberty to stop this. I would also like to consider weaning bosentan in future as well. I explained to her that the two most important things she can due for her PH is to avoid hypoemia and watching her fluid status closely. I gave her a zone tool and went over how to follow a sliding scale diuretic regimen. I suggested we switch her lasix to demadex to reduce metolazone usage but she did not want to do this. We provided her with a scale in clinic. We will check labs today.  In reviewing her echo she does have mild to moderate MR but I do not think this is playing a major role in her PH and she would not be a candidate for MVR so will follow medically.

## 2013-02-05 NOTE — Progress Notes (Signed)
Patient ID: April Mueller, female    DOB: 06/29/1946, 67 y.o.   MRN: [id number]  [facility] -Consult Note  HPI    April Mueller is a 67 yo with following PMHx referred by Dr. Delton Coombes for further evaluation of mitral regurgitation and PH.  1) COPD, severe       --former smoker (28 pk-yrs)       --on home O2 - 4L/min rest, up to 5L/min exertion.        --spirometry 6/13 in [place] FEV1 = 0.85L FVC = 1.3L no DLCO measured       --CT chest 6/14 - mild emphysema. No pulm fibrosis. Stable Asc Ao repair       --ECHO 6/14 EF 55-60% RV mildly dilated with normal function. Mild to moderate posterior MR. RVSP 56. Probable restrictive diastolic filling pattern (no tissue Doppler performed) 2) Obesity 3) RA       --on plaquenil 4) Ascending aortic aneurysm s/p repair 2005     --c/b renal failure requiring HD x 3 months 5) PAH - previously treated in [place]      --on Revatio and bosentan 6) OSA  Recently moved back from [place] and saw Dr. Delton Coombes. In [place] was followed by cardiology and pulmonary. Started on Tracleer in 2006. Revatio added last year.  Has not had a RHC in several years. Doesn't feel that she felt better with meds but she says they kept her from getting worse. Stopped smoking in January 2005 when she had Ao Aneurysm. Smoked 1.5 ppd x 15 - 20 years.   Very limited with her activity. Walks slowly. Uses O2 regularly. Can walk about 100 feet before she has to stop. She says it is worse when fluid builds up. Doesn't weigh herself at all. Dr. Delton Coombes added metolazone 3x/week and that has helped keep swelling down. Occasional orthopnea and PND.  Sleeps on 2-pillows with CPAP.    Review of Systems  Constitutional: Negative for fever and unexpected weight change.  HENT: Positive for nosebleeds. Negative for ear pain, congestion, sore throat, rhinorrhea, sneezing, trouble swallowing, dental problem, postnasal drip and sinus pressure.   Eyes: Negative for redness and itching.  Respiratory: Positive  for shortness of breath. Negative for cough, chest tightness and wheezing.  +orthopnea/PND Cardiovascular: Positive for palpitations and leg swelling.  Gastrointestinal:  Negative for nausea and vomiting.  Genitourinary: Negative for dysuria.  Musculoskeletal: Negative for joint swelling.  Skin: Negative for rash.  Neurological: Negative for headaches.  Hematological: Bruises/bleeds easily.  Psychiatric/Behavioral: Negative for dysphoric mood. The patient is not nervous/anxious.    Past Medical History  Diagnosis Date  . Hypertension   . Pulmonary HTN   . Aortic aneurysm   . Kidney disorder   . Sleep apnea     wears CPAP   Current Outpatient Prescriptions on File Prior to Encounter  Medication Sig Dispense Refill  . allopurinol (ZYLOPRIM) 300 MG tablet Take 300 mg by mouth daily.      [name] ALPRAZolam (XANAX) 0.25 MG tablet Take 0.25 mg by mouth as needed for sleep.      [name] aspirin 81 MG tablet Take 81 mg by mouth daily.      [name] atorvastatin (LIPITOR) 10 MG tablet Take 10 mg by mouth daily.      . Azilsartan Medoxomil (EDARBI) 80 MG TABS Take 1 tablet by mouth daily.      [name] bosentan (TRACLEER) 125 MG tablet Take 1 tablet (125 mg total) by mouth 2 (two)  times daily.  60 tablet  5  . ferrous gluconate (FERGON) 325 MG tablet Take 325 mg by mouth daily with breakfast.      . fish oil-omega-3 fatty acids 1000 MG capsule Take 1,200 g by mouth daily.       . furosemide (LASIX) 40 MG tablet Take 1 tablet (40 mg total) by mouth daily.  90 tablet  3  . hydroxychloroquine (PLAQUENIL) 200 MG tablet Take 200 mg by mouth 2 (two) times daily.      . metolazone (ZAROXOLYN) 2.5 MG tablet Take 2.5 mg by mouth 3 (three) times a week.       . metoprolol succinate (TOPROL-XL) 100 MG 24 hr tablet Take 100 mg by mouth 2 (two) times daily. Take with or immediately following a meal.      . potassium chloride (K-DUR) 10 MEQ tablet Take 10 mEq by mouth 2 (two) times daily.      . sildenafil (REVATIO) 20 MG tablet  Take 1 tablet (20 mg total) by mouth 3 (three) times daily.  270 tablet  3   No current facility-administered medications on file prior to encounter.   History   Social History  . Marital Status: Married    Spouse Name: N/A    Number of Children: 1  . Years of Education: N/A   Occupational History  . Disability     Office Work   Social History Main Topics  . Smoking status: Former Smoker -- 2.00 packs/day for 14 years    Types: Cigarettes    Quit date: 08/09/2003  . Smokeless tobacco: Never Used  . Alcohol Use: No  . Drug Use: No  . Sexually Active: None   Other Topics Concern  . None   Social History Narrative  . None   Family History  Problem Relation Age of Onset  . Heart attack Mother   . Cancer Father     prostate  . Diabetes Brother      BMET    Component Value Date/Time   NA 144 01/20/2013 1429   K 3.5 01/20/2013 1429   CL 105 01/20/2013 1429   CO2 29 01/20/2013 1429   GLUCOSE 75 01/20/2013 1429   BUN 26* 01/20/2013 1429   CREATININE 1.3* 01/20/2013 1429   CALCIUM 10.4 01/20/2013 1429   Hepatic Function Panel     Component Value Date/Time   PROT 7.1 01/20/2013 1429   ALBUMIN 3.4* 01/20/2013 1429   AST 17 01/20/2013 1429   ALT 15 01/20/2013 1429   ALKPHOS 61 01/20/2013 1429   BILITOT 0.6 01/20/2013 1429   BILIDIR 0.1 01/20/2013 1429       Objective:   Physical Exam Filed Vitals:   02/05/13 1426  BP: 110/60  Pulse: 58   Gen: Pleasant, well-nourished, in no distress,  normal affect HEENT: normal Neck: Supple JVP 7. Carotids 2+ with bilateral radiated bruits Lungs: Markedly decreased BS throughout. No use of accessory muscles, distant, clear without rales or rhonchi Cardiovascular: well healed sternotomy scar  PMI normal. RRR, heart sounds normal, 2/6 SEM RSB radiated to carotids Extremities: No deformities, no cyanosis or clubbing, tr-1+ edema L>R Neuro: alert, non focal. Cranial nerves intact. Moves all 4 extremities without difficulty Skin: Warm,  no lesions or rashes      Assessment & Plan:  No problem-specific assessment & plan notes found for this encounter.

## 2013-04-10 ENCOUNTER — Encounter (HOSPITAL_COMMUNITY): Payer: Self-pay

## 2013-04-10 ENCOUNTER — Ambulatory Visit (HOSPITAL_COMMUNITY)
Admission: RE | Admit: 2013-04-10 | Discharge: 2013-04-10 | Disposition: A | Discharge status: Home / Self Care | Payer: MEDICARE | Source: Ambulatory Visit | Attending: Internal Medicine | Admitting: Internal Medicine

## 2013-04-10 VITALS — BP 110/66 | HR 64 | Wt 212.0 lb

## 2013-04-10 DIAGNOSIS — Z87891 Personal history of nicotine dependence: Secondary | ICD-10-CM | POA: Insufficient documentation

## 2013-04-10 DIAGNOSIS — J449 Chronic obstructive pulmonary disease, unspecified: Secondary | ICD-10-CM | POA: Insufficient documentation

## 2013-04-10 DIAGNOSIS — N289 Disorder of kidney and ureter, unspecified: Secondary | ICD-10-CM | POA: Insufficient documentation

## 2013-04-10 DIAGNOSIS — G4733 Obstructive sleep apnea (adult) (pediatric): Secondary | ICD-10-CM | POA: Insufficient documentation

## 2013-04-10 DIAGNOSIS — I2789 Other specified pulmonary heart diseases: Secondary | ICD-10-CM | POA: Insufficient documentation

## 2013-04-10 DIAGNOSIS — Z79899 Other long term (current) drug therapy: Secondary | ICD-10-CM | POA: Insufficient documentation

## 2013-04-10 DIAGNOSIS — R002 Palpitations: Secondary | ICD-10-CM | POA: Insufficient documentation

## 2013-04-10 DIAGNOSIS — IMO0002 Reserved for concepts with insufficient information to code with codable children: Secondary | ICD-10-CM

## 2013-04-10 DIAGNOSIS — I10 Essential (primary) hypertension: Secondary | ICD-10-CM | POA: Insufficient documentation

## 2013-04-10 DIAGNOSIS — J4489 Other specified chronic obstructive pulmonary disease: Secondary | ICD-10-CM | POA: Insufficient documentation

## 2013-04-10 DIAGNOSIS — E669 Obesity, unspecified: Secondary | ICD-10-CM | POA: Insufficient documentation

## 2013-04-10 MED ORDER — POTASSIUM CHLORIDE ER 20 MEQ PO TBCR: 20.0000 meq | EXTENDED_RELEASE_TABLET | Freq: Two times a day (BID) | ORAL | Status: DC

## 2013-04-10 MED ORDER — FUROSEMIDE 40 MG PO TABS: 40.0000 mg | ORAL_TABLET | Freq: Two times a day (BID) | ORAL | Status: DC

## 2013-04-10 NOTE — Progress Notes (Signed)
Patient ID: April Mueller, female   DOB: 1946-01-11, 67 y.o.   MRN: [id number]     HPI  PCP: April Mueller Pulmonary: April Delton Mueller   April Mueller is a 67 yo with following PMHx  1) COPD, severe       --former smoker (28 pk-yrs)       --on home O2 - 4L/min rest, up to 5L/min exertion.        --spirometry 6/13 in [place] FEV1 = 0.85L FVC = 1.3L no DLCO measured       --CT chest 6/14 - mild emphysema. No pulm fibrosis. Stable Asc Ao repair       --ECHO 6/14 EF 55-60% RV mildly dilated with normal function. Mild to moderate posterior MR. RVSP 56. Probable restrictive diastolic filling pattern (no tissue Doppler performed) 2) Obesity 3) RA       --on plaquenil 4) Ascending aortic aneurysm s/p repair 2005     --c/b renal failure requiring HD x 3 months 5) PAH - previously treated in [place]      --on Revatio and bosentan 6) OSA  Recently moved back from [place] and saw April. Delton Mueller. In [place] was followed by cardiology and pulmonary. Started on Tracleer in 2006. Revatio added last year.  Has not had a RHC in several years. Doesn't feel that she felt better with meds but she says they kept her from getting worse. Stopped smoking in January 2005 when she had Ao Aneurysm. Smoked 1.5 ppd x 15 - 20 years.   She returns for follow up. Last visit revatio was stopped. Overall she feels the same and at her baseline. No episodes of syncope. Complains of palpitations. Remains SOB with exertion. She has not had her diuretics today. Weight at home 206-212 pounds.  Uses O2 regularly. Can walk about 100 feet before she has to stop.   Sleeps on 2-pillows with CPAP. Drinking < 2 liters 2 liters per day. Ongoing lower extremity edema  Labs 02/05/13 K 3.8 Creatinine 1.37    Past Medical History  Diagnosis Date  . Hypertension   . Pulmonary HTN   . Aortic aneurysm   . Kidney disorder   . Sleep apnea     wears CPAP   Current Outpatient Prescriptions on File Prior to Encounter  Medication Sig Dispense Refill  . allopurinol (ZYLOPRIM)  300 MG tablet Take 300 mg by mouth daily.      [name] ALPRAZolam (XANAX) 0.25 MG tablet Take 0.25 mg by mouth as needed for sleep.      [name] aspirin 81 MG tablet Take 81 mg by mouth daily.      [name] atorvastatin (LIPITOR) 10 MG tablet Take 1 tablet (10 mg total) by mouth daily.  90 tablet  3  . Azilsartan Medoxomil (EDARBI) 80 MG TABS Take 1 tablet (80 mg total) by mouth daily.  90 tablet  3  . bosentan (TRACLEER) 125 MG tablet Take 1 tablet (125 mg total) by mouth 2 (two) times daily.  60 tablet  5  . ferrous gluconate (FERGON) 325 MG tablet Take 325 mg by mouth daily with breakfast.      . fish oil-omega-3 fatty acids 1000 MG capsule Take 1,200 g by mouth daily.       . hydroxychloroquine (PLAQUENIL) 200 MG tablet Take 200 mg by mouth 2 (two) times daily.      . metolazone (ZAROXOLYN) 2.5 MG tablet Take 2.5 mg by mouth 3 (three) times a week.       [name]  metoprolol succinate (TOPROL-XL) 100 MG 24 hr tablet Take 1 tablet (100 mg total) by mouth 2 (two) times daily. Take with or immediately following a meal.  180 tablet  3   No current facility-administered medications on file prior to encounter.   History   Social History  . Marital Status: Married    Spouse Name: N/A    Number of Children: 1  . Years of Education: N/A   Occupational History  . Disability     Office Work   Social History Main Topics  . Smoking status: Former Smoker -- 2.00 packs/day for 14 years    Types: Cigarettes    Quit date: 08/09/2003  . Smokeless tobacco: Never Used  . Alcohol Use: No  . Drug Use: No  . Sexual Activity: None   Other Topics Concern  . None   Social History Narrative  . None   Family History  Problem Relation Age of Onset  . Heart attack Mother   . Cancer Father     prostate  . Diabetes Brother      BMET    Component Value Date/Time   NA 141 02/05/2013 1532   K 3.8 02/05/2013 1532   CL 103 02/05/2013 1532   CO2 30 02/05/2013 1532   GLUCOSE 84 02/05/2013 1532   BUN 28* 02/05/2013 1532    CREATININE 1.37* 02/05/2013 1532   CALCIUM 10.0 02/05/2013 1532   GFRNONAA 40* 02/05/2013 1532   GFRAA 46* 02/05/2013 1532   Hepatic Function Panel     Component Value Date/Time   PROT 7.4 02/05/2013 1532   ALBUMIN 3.4* 02/05/2013 1532   AST 17 02/05/2013 1532   ALT 13 02/05/2013 1532   ALKPHOS 66 02/05/2013 1532   BILITOT 0.3 02/05/2013 1532   BILIDIR 0.1 01/20/2013 1429       Objective:   Physical Exam Filed Vitals:   04/10/13 0839  BP: 110/66  Pulse: 64   Gen: Pleasant, well-nourished, in no distress,  normal affect HEENT: normal Neck: Supple JVP 10-11. Carotids 2+ with bilateral radiated bruits Lungs: Markedly decreased BS throughout. No use of accessory muscles, distant, clear without rales or rhonchi Cardiovascular: well healed sternotomy scar  PMI normal. RRR, heart sounds normal, 2/6 SEM RSB radiated to carotids Extremities: No deformities, no cyanosis or clubbing, RLE LLE 1+ edema Neuro: alert, non focal. Cranial nerves intact. Moves all 4 extremities without difficulty Skin: Warm, no lesions or rashes     1. Secondary Pulmonary HTN Continue oxygen. Volume status elevated. Increase lasix to 80 mg daily and continue Metolazone 2.5 mg M-W-F.Increase potassium to 20 meq twice a day. If volume status remains elelvated may need to switch to torsemide.  Ok off revatio. April Mueller to discuss bosentan wean at next visit.  Will need PFTs soon.  Refer pulmonary rehab Check BMET next week at [facility]   2. OSA - CPAP  3. Palpitations- Complaining of palpitations 1-2 days a week. Offered event monitor however she would like to wait. Will revisit at next OV.    Follow up in 1 month with April Mueller  April Mueller 11:05 AM

## 2013-04-10 NOTE — Patient Instructions (Addendum)
Follow up in 1 month with Dr Gala Romney  We will refer to you to pulmonary rehab.   Take lasix 80 mg daily  Take Kdur 20 meq twice a day  Do the following things EVERYDAY: 1) Weigh yourself in the morning before breakfast. Write it down and keep it in a log. 2) Take your medicines as prescribed 3) Eat low salt foods-Limit salt (sodium) to 2000 mg per day.  4) Stay as active as you can everyday 5) Limit all fluids for the day to less than 2 liters

## 2013-04-30 ENCOUNTER — Telehealth: Payer: Self-pay | Admitting: Emergency Medicine

## 2013-04-30 DIAGNOSIS — J849 Interstitial pulmonary disease, unspecified: Secondary | ICD-10-CM

## 2013-04-30 NOTE — Telephone Encounter (Signed)
RB please advise how soon to have CT, dx code, and with or without contrast. Thanks.

## 2013-04-30 NOTE — Telephone Encounter (Signed)
Recall: Pt needs to have CT schd for a follow up with dr Delton Coombes

## 2013-04-30 NOTE — Telephone Encounter (Signed)
Order CT chest for now, no contrast, high res cuts, dx is ILD

## 2013-05-01 NOTE — Telephone Encounter (Signed)
Attempted to contact patient. No answer. LMOAM for pt to return my call to schedule CT Chest. April Mueller

## 2013-05-01 NOTE — Telephone Encounter (Signed)
Order placed. Please advise PCC thanks

## 2013-05-04 NOTE — Telephone Encounter (Signed)
Spoke to pt she is aware of this appt April Mueller ° °

## 2013-05-04 NOTE — Telephone Encounter (Signed)
lmomtcb appt 05/06/13@8 :30am April Mueller

## 2013-05-06 ENCOUNTER — Ambulatory Visit (INDEPENDENT_AMBULATORY_CARE_PROVIDER_SITE_OTHER)
Admission: RE | Admit: 2013-05-06 | Discharge: 2013-05-06 | Disposition: A | Discharge status: Home / Self Care | Payer: MEDICARE | Source: Ambulatory Visit | Attending: Emergency Medicine | Admitting: Emergency Medicine

## 2013-05-06 DIAGNOSIS — J849 Interstitial pulmonary disease, unspecified: Secondary | ICD-10-CM

## 2013-05-06 DIAGNOSIS — J8410 Pulmonary fibrosis, unspecified: Secondary | ICD-10-CM

## 2013-05-08 ENCOUNTER — Ambulatory Visit (HOSPITAL_COMMUNITY)
Admission: RE | Admit: 2013-05-08 | Discharge: 2013-05-08 | Disposition: A | Discharge status: Home / Self Care | Payer: MEDICARE | Source: Ambulatory Visit | Attending: Internal Medicine | Admitting: Internal Medicine

## 2013-05-08 ENCOUNTER — Encounter (HOSPITAL_COMMUNITY): Payer: Self-pay

## 2013-05-08 VITALS — BP 110/64 | HR 64 | Wt 212.4 lb

## 2013-05-08 DIAGNOSIS — IMO0002 Reserved for concepts with insufficient information to code with codable children: Secondary | ICD-10-CM

## 2013-05-08 DIAGNOSIS — I2789 Other specified pulmonary heart diseases: Secondary | ICD-10-CM | POA: Insufficient documentation

## 2013-05-08 LAB — COMPREHENSIVE METABOLIC PANEL
BUN: 55 mg/dL — ABNORMAL HIGH (ref 6–23)
Calcium: 10.6 mg/dL — ABNORMAL HIGH (ref 8.4–10.5)
GFR calc Af Amer: 39 mL/min — ABNORMAL LOW (ref >90)
Glucose, Bld: 111 mg/dL — ABNORMAL HIGH (ref 70–99)
Sodium: 140 mEq/L (ref 135–145)
Total Protein: 8 g/dL (ref 6.0–8.3)

## 2013-05-08 MED ORDER — FUROSEMIDE 40 MG PO TABS: 40.0000 mg | ORAL_TABLET | Freq: Two times a day (BID) | ORAL | Status: DC

## 2013-05-08 MED ORDER — POTASSIUM CHLORIDE ER 20 MEQ PO TBCR: 20.0000 meq | EXTENDED_RELEASE_TABLET | Freq: Two times a day (BID) | ORAL | Status: DC

## 2013-05-08 NOTE — Progress Notes (Signed)
Patient ID: April Mueller, female   DOB: 19-Mar-1946, 67 y.o.   MRN: [id number]   HPI  PCP: Dr Tenny Craw Pulmonary: Dr Delton Coombes   April Mueller is a 67 yo with following PMHx  1) COPD, severe       --former smoker (28 pk-yrs)       --on home O2 - 4L/min rest, up to 5L/min exertion.        --spirometry 6/13 in [place] FEV1 = 0.85L FVC = 1.3L no DLCO measured       --CT chest 6/14 - mild emphysema. No pulm fibrosis. Stable Asc Ao repair       --ECHO 6/14 EF 55-60% RV mildly dilated with normal function. Mild to moderate posterior MR. RVSP 56. Probable restrictive diastolic filling pattern (no tissue Doppler performed) 2) Obesity 3) RA       --on plaquenil 4) Ascending aortic aneurysm s/p repair 2005     --c/b renal failure requiring HD x 3 months 5) PAH - previously treated in [place]      --on Revatio and bosentan 6) OSA  Recently moved back from [place] and saw Dr. Delton Coombes. In [place] was followed by cardiology and pulmonary. Started on Tracleer in 2006. Revatio added last year.  Has not had a RHC in several years. Doesn't feel that she felt better with meds but she says they kept her from getting worse. Stopped smoking in January 2005 when she had Ao Aneurysm. Smoked 1.5 ppd x 15 - 20 years.   She returns for follow up. Remains off revatio.  Overall she feels better. Has had couple episodes of dizziness. No palpitations  Remains SOB with exertion. Weight at home 206-212 pounds. Uses O2 regularly. Able to walk to the back of the grocery store.   Sleeps on 2-pillows with CPAP. Drinking < 2 liters per day. Lower extremity edema improved.   Labs 02/05/13 K 3.8 Creatinine 1.37    Past Medical History  Diagnosis Date  . Hypertension   . Pulmonary HTN   . Aortic aneurysm   . Kidney disorder   . Sleep apnea     wears CPAP   Current Outpatient Prescriptions on File Prior to Encounter  Medication Sig Dispense Refill  . allopurinol (ZYLOPRIM) 300 MG tablet Take 300 mg by mouth daily.      [name] ALPRAZolam (XANAX) 0.25 MG  tablet Take 0.25 mg by mouth as needed for sleep.      [name] aspirin 81 MG tablet Take 81 mg by mouth daily.      [name] atorvastatin (LIPITOR) 10 MG tablet Take 1 tablet (10 mg total) by mouth daily.  90 tablet  3  . Azilsartan Medoxomil (EDARBI) 80 MG TABS Take 1 tablet (80 mg total) by mouth daily.  90 tablet  3  . bosentan (TRACLEER) 125 MG tablet Take 1 tablet (125 mg total) by mouth 2 (two) times daily.  60 tablet  5  . ferrous gluconate (FERGON) 325 MG tablet Take 325 mg by mouth daily with breakfast.      . fish oil-omega-3 fatty acids 1000 MG capsule Take 1,200 g by mouth daily.       . furosemide (LASIX) 40 MG tablet Take 1 tablet (40 mg total) by mouth 2 (two) times daily.  90 tablet  3  . hydroxychloroquine (PLAQUENIL) 200 MG tablet Take 200 mg by mouth 2 (two) times daily.      . metolazone (ZAROXOLYN) 2.5 MG tablet Take 2.5 mg by mouth 3 (three)  times a week.       . metoprolol succinate (TOPROL-XL) 100 MG 24 hr tablet Take 1 tablet (100 mg total) by mouth 2 (two) times daily. Take with or immediately following a meal.  180 tablet  3  . potassium chloride 20 MEQ TBCR Take 20 mEq by mouth 2 (two) times daily. And as needed  90 tablet  3   No current facility-administered medications on file prior to encounter.   History   Social History  . Marital Status: Married    Spouse Name: N/A    Number of Children: 1  . Years of Education: N/A   Occupational History  . Disability     Office Work   Social History Main Topics  . Smoking status: Former Smoker -- 2.00 packs/day for 14 years    Types: Cigarettes    Quit date: 08/09/2003  . Smokeless tobacco: Never Used  . Alcohol Use: No  . Drug Use: No  . Sexual Activity: None   Other Topics Concern  . None   Social History Narrative  . None   Family History  Problem Relation Age of Onset  . Heart attack Mother   . Cancer Father     prostate  . Diabetes Brother      BMET    Component Value Date/Time   NA 141 02/05/2013 1532    K 3.8 02/05/2013 1532   CL 103 02/05/2013 1532   CO2 30 02/05/2013 1532   GLUCOSE 84 02/05/2013 1532   BUN 28* 02/05/2013 1532   CREATININE 1.37* 02/05/2013 1532   CALCIUM 10.0 02/05/2013 1532   GFRNONAA 40* 02/05/2013 1532   GFRAA 46* 02/05/2013 1532   Hepatic Function Panel     Component Value Date/Time   PROT 7.4 02/05/2013 1532   ALBUMIN 3.4* 02/05/2013 1532   AST 17 02/05/2013 1532   ALT 13 02/05/2013 1532   ALKPHOS 66 02/05/2013 1532   BILITOT 0.3 02/05/2013 1532   BILIDIR 0.1 01/20/2013 1429       Objective:   Physical Exam Filed Vitals:   05/08/13 0927  BP: 110/64  Pulse: 64   Gen: Pleasant, well-nourished, in no distress,  normal affect HEENT: normal Neck: Supple JVP 8-9. Carotids 2+ with bilateral radiated bruits Lungs: Markedly decreased BS throughout. No use of accessory muscles, distant, clear without rales or rhonchi Cardiovascular: well healed sternotomy scar  PMI normal. RRR, heart sounds normal, 2/6 SEM RSB radiated to carotids Extremities: No deformities, no cyanosis or clubbing, no RLE LLE edema Neuro: alert, non focal. Cranial nerves intact. Moves all 4 extremities without difficulty Skin: Warm, no lesions or rashes     1. Secondary Pulmonary HTN Continue oxygen. Volume status ok . Continue lasix to 40 mg twice a day and continue Metolazone 2.5 mg M-W-F.Increase potassium to 20 meq twice a day. Ok off revatio.   Will need PFTs soon.  Refer pulmonary rehab Check CMET Follow with Dr Gala Romney at next visit for possible bosentan wean  2. OSA - CPAP Continue CPAP.   3. Palpitations- Resolved.    Follow up in 2 months with Dr Gala Romney   CLEGG,AMY 9:50 AM

## 2013-05-08 NOTE — Patient Instructions (Addendum)
Follow up in 6 weeks with Dr Bensimhon  Do the following things EVERYDAY: 1) Weigh yourself in the morning before breakfast. Write it down and keep it in a log. 2) Take your medicines as prescribed 3) Eat low salt foods-Limit salt (sodium) to 2000 mg per day.  4) Stay as active as you can everyday 5) Limit all fluids for the day to less than 2 liters 

## 2013-06-05 ENCOUNTER — Encounter (HOSPITAL_COMMUNITY): Payer: Self-pay

## 2013-06-05 ENCOUNTER — Other Ambulatory Visit (HOSPITAL_COMMUNITY): Payer: Self-pay | Admitting: Cardiology

## 2013-06-05 DIAGNOSIS — I2789 Other specified pulmonary heart diseases: Secondary | ICD-10-CM

## 2013-06-05 MED ORDER — METOPROLOL SUCCINATE ER 100 MG PO TB24: 100.0000 mg | ORAL_TABLET | Freq: Two times a day (BID) | ORAL | Status: DC

## 2013-06-05 MED ORDER — ATORVASTATIN CALCIUM 10 MG PO TABS: 10.0000 mg | ORAL_TABLET | Freq: Every day | ORAL | Status: DC

## 2013-06-05 NOTE — Progress Notes (Signed)
I have called April Mueller to inquire about her attending pulmonary rehab.  Patient states that she will have to decline because she can not afford the co-pay.

## 2013-06-05 NOTE — Telephone Encounter (Signed)
Pt will not be able to get meds from mail order pharmacy for a few days Pt is requesting rx to be sent to local pharmacy Requested Prescriptions   Pending Prescriptions Disp Refills  . metoprolol succinate (TOPROL-XL) 100 MG 24 hr tablet 60 tablet 3    Sig: Take 1 tablet (100 mg total) by mouth 2 (two) times daily. Take with or immediately following a meal.  . atorvastatin (LIPITOR) 10 MG tablet 30 tablet 3    Sig: Take 1 tablet (10 mg total) by mouth daily.

## 2013-06-09 ENCOUNTER — Encounter: Payer: Self-pay | Admitting: Emergency Medicine

## 2013-06-09 ENCOUNTER — Ambulatory Visit (INDEPENDENT_AMBULATORY_CARE_PROVIDER_SITE_OTHER): Payer: MEDICARE | Admitting: Emergency Medicine

## 2013-06-09 VITALS — BP 100/60 | HR 65 | Ht 64.0 in | Wt 214.0 lb

## 2013-06-09 DIAGNOSIS — Z23 Encounter for immunization: Secondary | ICD-10-CM

## 2013-06-09 DIAGNOSIS — M069 Rheumatoid arthritis, unspecified: Secondary | ICD-10-CM

## 2013-06-09 DIAGNOSIS — J449 Chronic obstructive pulmonary disease, unspecified: Secondary | ICD-10-CM

## 2013-06-09 DIAGNOSIS — IMO0002 Reserved for concepts with insufficient information to code with codable children: Secondary | ICD-10-CM

## 2013-06-09 DIAGNOSIS — I2789 Other specified pulmonary heart diseases: Secondary | ICD-10-CM

## 2013-06-09 DIAGNOSIS — G4733 Obstructive sleep apnea (adult) (pediatric): Secondary | ICD-10-CM

## 2013-06-09 NOTE — Assessment & Plan Note (Signed)
Discussed stopping Tracleer today - she is very fearful about this, is concerned that she will decline. I don't believe that the medication does much in this setting, will revisit with her next time.

## 2013-06-09 NOTE — Assessment & Plan Note (Signed)
Continue CPAP.  

## 2013-06-09 NOTE — Patient Instructions (Signed)
We discussed stopping Tracleer today. We will revisit this next visit Please continue your oxygen  Continue your CPAP every night.  Please start Anoro one inhalation daily for the next month to see if it helps you Follow with Dr Delton Coombes in 1 month to discuss whether this medication is helpful.

## 2013-06-09 NOTE — Addendum Note (Signed)
Addended by: Gwynneth Aliment A on: 06/09/2013 05:17 PM   Modules accepted: Orders

## 2013-06-09 NOTE — Assessment & Plan Note (Signed)
Appears to be severe although she has never seemed to respond to BD's. I would like for her to try again - will start Anoro qd x 1 month, then follow to reassess her breathing

## 2013-06-09 NOTE — Assessment & Plan Note (Signed)
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

## 2013-06-09 NOTE — Progress Notes (Signed)
Subjective:    Patient ID: April Mueller, female    DOB: 08-26-1945, 67 y.o.   MRN: [id number]  HPI 67 yo, former smoker (28 pk-yrs), hx of HTN, OSA on CPAP, Rheumatoid arthritis on plaquenil. Hx AAA 2005 c/b ARF and 3 months of HD (now off). 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 She moved back to [place] from [place] in 8/13, is referred by Dr Duane Lope for [facility] that has been managed on bosentan 125mg  bid + sildenifil 20mg  tid, supplemental O2 4L/min rest, up to 5L/min exertion.   She reports a worsening in her dyspnea over the last 3 months. No CP. No wheeze or cough. She is able to exert some, able to walk when she takes her time. Her nose is congested. She had epistaxis in Jan, went to [place]. Has recurred some, not currently.   She has had PFT, CT scan chest, R heart cath, echocardiograms in [place] > reviewed 12/11/12   ROV 12/11/12 -- f/u for COPD, RA, OSA and secondary PAH. 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 She is planning to see Dr Nickola Major for Rheum, hasn't done so yet. She has been clinically stable. Using lasix 40 daily, increased zaroxolyn on her own to about 3 x a week. Has been doing this for a week.   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 S/p TTE  As below > PASP 56, L atrial enlargement w mild to mod MR.   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 Here for f/u. On bosentan, stopped sildenifil and she didn't miss it. I asked her to see Dr Gala Romney regarding her moderate MR - no indication for MVR (and she is a poor candidate). 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     Review of Systems  Constitutional: Negative for fever and unexpected weight change.  HENT: Positive for nosebleeds. Negative for congestion, dental problem, ear pain, postnasal drip, rhinorrhea, sinus pressure,  sneezing, sore throat and trouble swallowing.   Eyes: Negative for redness and itching.  Respiratory: Positive for shortness of breath. Negative for cough, chest tightness and wheezing.   Cardiovascular: Positive for palpitations and leg swelling.  Gastrointestinal: Positive for diarrhea. Negative for nausea and vomiting.  Genitourinary: Negative for dysuria.  Musculoskeletal: Negative for joint swelling.  Skin: Negative for rash.  Neurological: Negative for headaches.  Hematological: Bruises/bleeds easily.  Psychiatric/Behavioral: Negative for dysphoric mood. The patient is not nervous/anxious.    BMET Hepatic Function Panel     Component Value Date/Time   PROT 8.0 05/08/2013 1006   ALBUMIN 3.5 05/08/2013 1006   AST 15 05/08/2013 1006   ALT 11 05/08/2013 1006   ALKPHOS 71 05/08/2013 1006   BILITOT 0.2* 05/08/2013 1006   BILIDIR 0.1 01/20/2013 1429       Objective:   Physical Exam Filed Vitals:   06/09/13 1629  BP: 100/60  Pulse: 65   Gen: Pleasant, well-nourished, in no distress,  normal affect  ENT: No lesions,  mouth clear,  oropharynx clear, no postnasal drip  Neck: No JVD, no TMG, no carotid bruits  Lungs: No use of accessory muscles, distant, clear without rales or rhonchi  Cardiovascular: RRR, heart sounds normal, no murmur or gallops, no peripheral edema  Musculoskeletal: No deformities, no cyanosis  or clubbing, 1+ edema  Neuro: alert, non focal  Skin: Warm, no lesions or rashes  Study Conclusions   12/17/12 --  - Left ventricle: The cavity size was normal. Wall thickness was normal. Systolic function was normal. The estimated ejection fraction was in the range of 55% to 60%. Wall motion was normal; there were no regional wall motion abnormalities. - Mitral valve: MR is eccentric and directed posteriorl somewhat hard to judge extent Mildly calcified annulus. Mildly thickened leaflets . Mild to moderate regurgitation. - Left atrium: The atrium was mildly  dilated. - Right ventricle: The cavity size was mildly dilated. - Right atrium: The atrium was mildly dilated. - Atrial septum: The septum bowed from left to right, consistent with increased left atrial pressure. - Pulmonary arteries: PA peak pressure: 56mm Hg (S).  TTE from [facility]  >>      Assessment & Plan:  Secondary pulmonary hypertension Discussed stopping Tracleer today - she is very fearful about this, is concerned that she will decline. I don't believe that the medication does much in this setting, will revisit with her next time.   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 I would like for her to try again - will start Anoro qd x 1 month, then follow to reassess her breathing

## 2013-06-19 ENCOUNTER — Encounter (HOSPITAL_COMMUNITY): Payer: MEDICARE

## 2013-06-22 ENCOUNTER — Ambulatory Visit (HOSPITAL_COMMUNITY)
Admission: RE | Admit: 2013-06-22 | Discharge: 2013-06-22 | Disposition: A | Discharge status: Home / Self Care | Payer: MEDICARE | Source: Ambulatory Visit | Attending: Internal Medicine | Admitting: Internal Medicine

## 2013-06-22 ENCOUNTER — Telehealth (HOSPITAL_COMMUNITY): Payer: Self-pay | Admitting: Anesthesiology

## 2013-06-22 VITALS — BP 112/52 | HR 65 | Wt 215.5 lb

## 2013-06-22 DIAGNOSIS — R0609 Other forms of dyspnea: Secondary | ICD-10-CM | POA: Insufficient documentation

## 2013-06-22 DIAGNOSIS — R0989 Other specified symptoms and signs involving the circulatory and respiratory systems: Secondary | ICD-10-CM | POA: Insufficient documentation

## 2013-06-22 DIAGNOSIS — IMO0002 Reserved for concepts with insufficient information to code with codable children: Secondary | ICD-10-CM

## 2013-06-22 DIAGNOSIS — R0600 Dyspnea, unspecified: Secondary | ICD-10-CM

## 2013-06-22 DIAGNOSIS — I2789 Other specified pulmonary heart diseases: Secondary | ICD-10-CM | POA: Insufficient documentation

## 2013-06-22 LAB — BASIC METABOLIC PANEL
CO2: 28 mEq/L (ref 19–32)
Chloride: 100 mEq/L (ref 96–112)
Potassium: 3.8 mEq/L (ref 3.5–5.1)
Sodium: 139 mEq/L (ref 135–145)

## 2013-06-22 LAB — PRO B NATRIURETIC PEPTIDE: Pro B Natriuretic peptide (BNP): 1484 pg/mL — ABNORMAL HIGH (ref 0–125)

## 2013-06-22 MED ORDER — METOLAZONE 2.5 MG PO TABS: 2.5000 mg | ORAL_TABLET | ORAL | Status: DC

## 2013-06-22 NOTE — Progress Notes (Signed)
6 min walk test completed, pt only able to ambulate 600 ft (182 meters), pt had to stop several times, O2 sat ranged from 85-90% on 5 L oxygen, HR ranged from 70-74

## 2013-06-22 NOTE — Progress Notes (Addendum)
Patient ID: April Mueller, female   DOB: Oct 28, 1945, 67 y.o.   MRN: [id number]  HPI  PCP: Dr Tenny Craw Pulmonary: Dr Delton Coombes   [name] is a 67 yo with following PMHx  1) COPD, severe       --former smoker (28 pk-yrs)       --on home O2 - 4L/min rest, up to 5L/min exertion.        --spirometry 6/13 in [place] FEV1 = 0.85L FVC = 1.3L no DLCO measured. FEV1 in 2014 = 0.73       --CT chest 10/14 - moderate centrilobular emphysema. No pulm fibrosis. Stable Asc Ao repair       --ECHO 6/14 EF 55-60% RV mildly dilated with normal function. Mild to moderate posterior MR. RVSP 56. Probable restrictive diastolic filling pattern 2) Obesity 3) RA       --on plaquenil 4) Ascending aortic aneurysm s/p repair 2005     --c/b renal failure requiring HD x 3 months 5) PAH - previously treated in [place]      --on bosentan (revatio stopped 10/14) 6) OSA  Moved back from [place] and saw Dr. Delton Coombes. In [place] was followed by cardiology and pulmonary. Started on Tracleer in 2006. Revatio was added, however was discontinued at last visit. Has not had a RHC in several years. Doesn't feel that she felt better with meds but she says they kept her from getting worse. Stopped smoking in January 2005 when she had Ao Aneurysm. Smoked 1.5 ppd x 15 - 20 years.   Follow up: Patient reports she is doing ok, not much change after stopping Revatio. Can walk about 100 ft before SOB. Denies dizziness, orthopnea, edema or PND. Weight at home 212-215 lbs. At grocery store if leaning on shopping cart can go around the whole store. Compliant with CPAP and medications. Following a low salt diet. On chronic 4L.  Labs 02/05/13 K 3.8 Creatinine 1.37 05/08/13 K 3.8, Creatinine 1.58   Past Medical History  Diagnosis Date  . Hypertension   . Pulmonary HTN   . Aortic aneurysm   . Kidney disorder   . Sleep apnea     wears CPAP   Current Outpatient Prescriptions on File Prior to Encounter  Medication Sig Dispense Refill  . allopurinol (ZYLOPRIM) 300 MG  tablet Take 300 mg by mouth daily.      [name] ALPRAZolam (XANAX) 0.25 MG tablet Take 0.25 mg by mouth as needed for sleep.      [name] aspirin 81 MG tablet Take 81 mg by mouth daily.      [name] atorvastatin (LIPITOR) 10 MG tablet Take 1 tablet (10 mg total) by mouth daily.  30 tablet  3  . Azilsartan Medoxomil (EDARBI) 80 MG TABS Take 1 tablet (80 mg total) by mouth daily.  90 tablet  3  . bosentan (TRACLEER) 125 MG tablet Take 1 tablet (125 mg total) by mouth 2 (two) times daily.  60 tablet  5  . ferrous gluconate (FERGON) 325 MG tablet Take 325 mg by mouth daily with breakfast.      . fish oil-omega-3 fatty acids 1000 MG capsule Take 1,200 g by mouth daily.       . furosemide (LASIX) 40 MG tablet Take 1 tablet (40 mg total) by mouth 2 (two) times daily.  180 tablet  3  . hydroxychloroquine (PLAQUENIL) 200 MG tablet Take 200 mg by mouth 2 (two) times daily.      . metolazone (ZAROXOLYN) 2.5 MG tablet Take  2.5 mg by mouth 3 (three) times a week.       . metoprolol succinate (TOPROL-XL) 100 MG 24 hr tablet Take 1 tablet (100 mg total) by mouth 2 (two) times daily. Take with or immediately following a meal.  60 tablet  3  . Potassium Chloride ER 20 MEQ TBCR Take 20 mEq by mouth 2 (two) times daily. And as needed  180 tablet  3   No current facility-administered medications on file prior to encounter.   History   Social History  . Marital Status: Married    Spouse Name: N/A    Number of Children: 1  . Years of Education: N/A   Occupational History  . Disability     Office Work   Social History Main Topics  . Smoking status: Former Smoker -- 2.00 packs/day for 14 years    Types: Cigarettes    Quit date: 08/09/2003  . Smokeless tobacco: Never Used  . Alcohol Use: No  . Drug Use: No  . Sexual Activity: Not on file   Other Topics Concern  . Not on file   Social History Narrative  . No narrative on file   Family History  Problem Relation Age of Onset  . Heart attack Mother   . Cancer Father      prostate  . Diabetes Brother      BMET    Component Value Date/Time   NA 140 05/08/2013 1006   K 3.8 05/08/2013 1006   CL 102 05/08/2013 1006   CO2 30 05/08/2013 1006   GLUCOSE 111* 05/08/2013 1006   BUN 55* 05/08/2013 1006   CREATININE 1.58* 05/08/2013 1006   CALCIUM 10.6* 05/08/2013 1006   GFRNONAA 33* 05/08/2013 1006   GFRAA 39* 05/08/2013 1006   Hepatic Function Panel     Component Value Date/Time   PROT 8.0 05/08/2013 1006   ALBUMIN 3.5 05/08/2013 1006   AST 15 05/08/2013 1006   ALT 11 05/08/2013 1006   ALKPHOS 71 05/08/2013 1006   BILITOT 0.2* 05/08/2013 1006   BILIDIR 0.1 01/20/2013 1429    Filed Vitals:   06/22/13 0849  BP: 112/52  Pulse: 65  Weight: 215 lb 8 oz (97.75 kg)  SpO2: 89%   Physical Exam Gen: Pleasant, well-nourished, in no distress, normal affect; on 4L O2 HEENT: normal Neck: Supple JVP 7-8. Carotids 2+ with bilateral radiated bruits Lungs: Decreased BS throughout. No use of accessory muscles, distant, clear without rales or rhonchi Cardiovascular: well healed sternotomy scar  PMI normal. RRR, heart sounds normal, 2/6 SEM RSB radiated to carotids Extremities: No deformities, no cyanosis or clubbing, trace bilateral edema  Neuro: alert, non focal. Cranial nerves intact. Moves all 4 extremities without difficulty Skin: Warm, no lesions or rashes   1. Secondary Pulmonary HTN -Previous FEV1 0.7L - today 182 meters and O2 sats 85-90% on 5L O2. - not able to go to [facility] rehab d/t cost - Continue oxygen 24/7. Volume status ok, will continue current dose of diuretics.    - She still has marked SOB, will order PFTs with DLCO and VQ scan.  - Will wean bosentan to 1 tablet daily and if SOB does not get worse after 2 weeks stop. 2. OSA - CPAP Continue CPAP nightly.  Ulla Potash B 8:59 AM  Patient seen and examined with Ulla Potash, NP. We discussed all aspects of the encounter. I agree with the assessment and plan as stated above.   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 I do not think she has primary pHTN. Have again suggested wean of bosentan. We also discussed the possibility of lung transplantation - which she is willing to consider but she understands she may not be a candidate.   I called Dr. Delton Coombes to discuss and he agrees. Will get updated PFTs with DLCO and VQ scan. She already has recent CT. May need RHC as well.   Volume status looks ok. Continue diuretics and sliding scale regimen.   Daniel Bensimhon,MD 10:27 AM

## 2013-06-22 NOTE — Addendum Note (Signed)
Encounter addended by: Aundria Rud, NP on: 06/22/2013  5:21 PM<BR>     Documentation filed: Notes Section

## 2013-06-22 NOTE — Telephone Encounter (Signed)
Patient's labs reviewed from OV today.  Cr 1.91 and K+ 3.8. Cr is elevated will have her hold lasix for two days and change metolazone to 2.5 mg twice a week. Will recheck next week. Patient aware.   Ulla Potash B NP-C 1:36 PM

## 2013-06-22 NOTE — Patient Instructions (Addendum)
Recommend going to local pharmacy and get a oxygen saturation probe to measure your oxygen at home. Turn your oxygen up if O2 sats are less than 90%.  Wear your oxygen 24/7.  Cut your bosentan to 1 tablet daily for 2-3 weeks. If you do not notice a change in how you feel and your shortness of breath does not get worse on 1 tablet daily then you can stop completely.  Your physician has recommended that you have a pulmonary function test. Pulmonary Function Tests are a group of tests that measure how well air moves in and out of your lungs.  Your physician has recommended you have a VQ scan  Call any issues.  Will refer to [facility] for lung transplant evaluation.  Follow up 3 months.  Do the following things EVERYDAY: 1) Weigh yourself in the morning before breakfast. Write it down and keep it in a log. 2) Take your medicines as prescribed 3) Eat low salt foods-Limit salt (sodium) to 2000 mg per day.  4) Stay as active as you can everyday 5) Limit all fluids for the day to less than 2 liters

## 2013-06-25 ENCOUNTER — Ambulatory Visit (HOSPITAL_COMMUNITY): Payer: MEDICARE

## 2013-07-01 ENCOUNTER — Ambulatory Visit (HOSPITAL_COMMUNITY)
Admission: RE | Admit: 2013-07-01 | Discharge: 2013-07-01 | Disposition: A | Discharge status: Home / Self Care | Payer: MEDICARE | Source: Ambulatory Visit | Attending: Internal Medicine | Admitting: Internal Medicine

## 2013-07-01 DIAGNOSIS — I2789 Other specified pulmonary heart diseases: Secondary | ICD-10-CM | POA: Insufficient documentation

## 2013-07-01 DIAGNOSIS — IMO0002 Reserved for concepts with insufficient information to code with codable children: Secondary | ICD-10-CM

## 2013-07-01 LAB — COMPREHENSIVE METABOLIC PANEL
ALT: 9 U/L (ref 0–35)
AST: 14 U/L (ref 0–37)
CO2: 28 mEq/L (ref 19–32)
Chloride: 105 mEq/L (ref 96–112)
GFR calc non Af Amer: 28 mL/min — ABNORMAL LOW (ref >90)
Sodium: 141 mEq/L (ref 135–145)
Total Bilirubin: 0.3 mg/dL (ref 0.3–1.2)

## 2013-07-03 ENCOUNTER — Ambulatory Visit (HOSPITAL_COMMUNITY)
Admission: RE | Admit: 2013-07-03 | Discharge: 2013-07-03 | Disposition: A | Discharge status: Home / Self Care | Payer: MEDICARE | Source: Ambulatory Visit | Attending: Internal Medicine | Admitting: Internal Medicine

## 2013-07-03 DIAGNOSIS — R0989 Other specified symptoms and signs involving the circulatory and respiratory systems: Secondary | ICD-10-CM | POA: Insufficient documentation

## 2013-07-03 DIAGNOSIS — IMO0002 Reserved for concepts with insufficient information to code with codable children: Secondary | ICD-10-CM

## 2013-07-03 DIAGNOSIS — R0600 Dyspnea, unspecified: Secondary | ICD-10-CM

## 2013-07-03 DIAGNOSIS — R0609 Other forms of dyspnea: Secondary | ICD-10-CM | POA: Insufficient documentation

## 2013-07-03 MED ORDER — ALBUTEROL SULFATE (5 MG/ML) 0.5% IN NEBU
2.5000 mg | INHALATION_SOLUTION | Freq: Once | RESPIRATORY_TRACT | Status: AC
  Administered 2013-07-03: 2.5 mg via RESPIRATORY_TRACT

## 2013-07-03 MED ORDER — TECHNETIUM TC 99M DIETHYLENETRIAME-PENTAACETIC ACID: 40.0000 | Freq: Once | INTRAVENOUS | Status: AC | PRN

## 2013-07-03 MED ORDER — TECHNETIUM TO 99M ALBUMIN AGGREGATED
6.0000 | Freq: Once | INTRAVENOUS | Status: AC | PRN
  Administered 2013-07-03: 6 via INTRAVENOUS

## 2013-07-13 ENCOUNTER — Telehealth (HOSPITAL_COMMUNITY): Payer: Self-pay | Admitting: Cardiology

## 2013-07-13 NOTE — Telephone Encounter (Signed)
Pt called to report she had to restart Tracleer Pt was advised at last office visit to d/c this med via titration  Pt was able to come off med however two days later she needed to restart secondary to SOB and breathing difficulty  Just wanted to let providers know

## 2013-07-14 NOTE — Telephone Encounter (Signed)
Ok per Dr Gala Romney, pt is sch to f/u with Dr Delton Coombes on [facility] 12/11, note forwarded to him

## 2013-07-14 NOTE — Telephone Encounter (Signed)
Thank you :)

## 2013-07-16 ENCOUNTER — Ambulatory Visit (INDEPENDENT_AMBULATORY_CARE_PROVIDER_SITE_OTHER): Payer: MEDICARE | Admitting: Emergency Medicine

## 2013-07-16 ENCOUNTER — Encounter: Payer: Self-pay | Admitting: Emergency Medicine

## 2013-07-16 VITALS — BP 120/74 | HR 62 | Temp 98.3°F | Ht 64.0 in | Wt 220.0 lb

## 2013-07-16 DIAGNOSIS — G4733 Obstructive sleep apnea (adult) (pediatric): Secondary | ICD-10-CM

## 2013-07-16 DIAGNOSIS — J449 Chronic obstructive pulmonary disease, unspecified: Secondary | ICD-10-CM

## 2013-07-16 DIAGNOSIS — I2789 Other specified pulmonary heart diseases: Secondary | ICD-10-CM

## 2013-07-16 DIAGNOSIS — J4489 Other specified chronic obstructive pulmonary disease: Secondary | ICD-10-CM

## 2013-07-16 DIAGNOSIS — IMO0002 Reserved for concepts with insufficient information to code with codable children: Secondary | ICD-10-CM

## 2013-07-16 NOTE — Assessment & Plan Note (Signed)
No response to Anoro, will d/c Continue O2 at all times

## 2013-07-16 NOTE — Patient Instructions (Addendum)
Stop Anoro.  Continue tracleer 125mg  twice a day.  Continue to get your blood work monthly at [facility] Continue your oxygen at 4L/min.  Continue your CPAP every night  We will ask [facility] to contact you regarding a home health assessment and a wheeled walker Follow with Dr Delton Coombes in 3 months or sooner if you have any problems.

## 2013-07-16 NOTE — Assessment & Plan Note (Signed)
CPAP qhs, good compliance 

## 2013-07-16 NOTE — Progress Notes (Signed)
Subjective:    Patient ID: April Mueller, female    DOB: Mar 03, 1946, 67 y.o.   MRN: [id number]  HPI 67 yo, former smoker (28 pk-yrs), hx of HTN, OSA on CPAP, Rheumatoid arthritis on plaquenil. Hx AAA 2005 c/b ARF and 3 months of HD (now off). 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 She moved back to [place] from [place] in 8/13, is referred by Dr Duane Lope for [facility] that has been managed on bosentan 125mg  bid + sildenifil 20mg  tid, supplemental O2 4L/min rest, up to 5L/min exertion.   She reports a worsening in her dyspnea over the last 3 months. No CP. No wheeze or cough. She is able to exert some, able to walk when she takes her time. Her nose is congested. She had epistaxis in Jan, went to [place]. Has recurred some, not currently.   She has had PFT, CT scan chest, R heart cath, echocardiograms in [place] > reviewed 12/11/12   ROV 12/11/12 -- f/u for COPD, RA, OSA and secondary PAH. 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 She is planning to see Dr Nickola Major for Rheum, hasn't done so yet. She has been clinically stable. Using lasix 40 daily, increased zaroxolyn on her own to about 3 x a week. Has been doing this for a week.   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 S/p TTE  As below > PASP 56, L atrial enlargement w mild to mod MR.   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 Here for f/u. On bosentan, stopped sildenifil and she didn't miss it. I asked her to see Dr Gala Romney regarding her moderate MR - no indication for MVR (and she is a poor candidate). 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 Last time we discussed stopping tracleer, also added Anoro to see if she would benefit. She did not benefit from the anoro.  The tracleer was tapered down in discussion with Dr Gala Romney, but she did not tolerate > had increased SOB, decreased functional capacity and restarted. LFT normal on 11/26. She had V/q scan 11/28 >> low prob study. Some subjective wt gain, ? How much. Will order wheeled walker through [facility].    Review of Systems  Constitutional: Negative for fever and unexpected weight change.  HENT: Positive for nosebleeds. Negative for congestion, dental problem, ear pain, postnasal drip, rhinorrhea, sinus pressure, sneezing, sore throat and trouble swallowing.   Eyes: Negative for redness and itching.  Respiratory: Positive for shortness of breath. Negative for cough, chest tightness and wheezing.   Cardiovascular: Positive for palpitations and leg swelling.  Gastrointestinal: Positive for diarrhea. Negative for nausea and vomiting.  Genitourinary: Negative for dysuria.  Musculoskeletal: Negative for joint swelling.  Skin: Negative for rash.  Neurological: Negative for headaches.  Hematological: Bruises/bleeds easily.  Psychiatric/Behavioral: Negative for dysphoric mood. The patient is not nervous/anxious.    BMET Hepatic Function Panel     Component Value Date/Time   PROT 7.9 07/01/2013 0954   ALBUMIN 3.5 07/01/2013 0954   AST 14 07/01/2013 0954   ALT 9 07/01/2013 0954   ALKPHOS 76 07/01/2013 0954   BILITOT 0.3 07/01/2013 0954  BILIDIR 0.1 01/20/2013 1429       Objective:   Physical Exam Filed Vitals:   07/16/13 0900  BP: 120/74  Pulse: 62  Temp: 98.3 F (36.8 C)   Gen: Pleasant, well-nourished, in no distress,  normal affect  ENT: No lesions,  mouth clear,  oropharynx clear, no postnasal drip  Neck: No JVD, no TMG, no carotid bruits  Lungs: No use of accessory muscles, distant, clear without rales or rhonchi  Cardiovascular: RRR, heart sounds normal, no murmur or gallops, no peripheral edema  Musculoskeletal: No deformities, no cyanosis or clubbing, trace to 1+ pretibial  edema  Neuro: alert, non focal  Skin: Warm, no lesions or rashes  Study Conclusions   12/17/12 --  - Left ventricle: The cavity size was normal. Wall thickness was normal. Systolic function was normal. The estimated ejection fraction was in the range of 55% to 60%. Wall motion was normal; there were no regional wall motion abnormalities. - Mitral valve: MR is eccentric and directed posteriorl somewhat hard to judge extent Mildly calcified annulus. Mildly thickened leaflets . Mild to moderate regurgitation. - Left atrium: The atrium was mildly dilated. - Right ventricle: The cavity size was mildly dilated. - Right atrium: The atrium was mildly dilated. - Atrial septum: The septum bowed from left to right, consistent with increased left atrial pressure. - Pulmonary arteries: PA peak pressure: 56mm Hg (S).      Assessment & Plan:  Secondary pulmonary hypertension Continue tracleer 125mg  twice a day.  Continue to get your blood work monthly at [facility] Continue your oxygen at 4L/min.  Continue your CPAP every night  We will ask [employer] to contact you regarding a home health assessment and a wheeled walker Follow with Dr Delton Coombes in 3 months or sooner if you have any problems  COPD (chronic obstructive pulmonary disease) No response to Anoro, will d/c Continue O2 at all times  OSA on CPAP CPAP qhs, good compliance

## 2013-07-16 NOTE — Assessment & Plan Note (Signed)
Continue tracleer 125mg  twice a day.  Continue to get your blood work monthly at [facility] Continue your oxygen at 4L/min.  Continue your CPAP every night  We will ask [facility] to contact you regarding a home health assessment and a wheeled walker Follow with Dr Delton Coombes in 3 months or sooner if you have any problems

## 2013-07-20 ENCOUNTER — Telehealth: Payer: Self-pay | Admitting: Emergency Medicine

## 2013-07-20 DIAGNOSIS — M069 Rheumatoid arthritis, unspecified: Secondary | ICD-10-CM

## 2013-07-20 DIAGNOSIS — J449 Chronic obstructive pulmonary disease, unspecified: Secondary | ICD-10-CM

## 2013-07-20 NOTE — Telephone Encounter (Signed)
Pt called in ref to order placed with [employer] for walker.  Offering her walker with 2 wheels and needs walker with 4 wheels and seat. Have redone order to [facility] and pt is aware.  Nothing further needed.

## 2013-07-22 ENCOUNTER — Telehealth: Payer: Self-pay | Admitting: Emergency Medicine

## 2013-07-22 NOTE — Telephone Encounter (Signed)
Yes please order thanks 

## 2013-07-22 NOTE — Telephone Encounter (Signed)
lmomtcb x1 for nick

## 2013-07-22 NOTE — Telephone Encounter (Signed)
Dr. Delton Coombes please advise if okay for VO? thanks

## 2013-07-23 NOTE — Telephone Encounter (Signed)
[email or web address]. Jennifer Castillo, CMA  

## 2013-07-24 NOTE — Telephone Encounter (Signed)
[name] returning call can be reached at [phone] says ok to [name]

## 2013-07-24 NOTE — Telephone Encounter (Signed)
lmomtcb x3 for nick

## 2013-07-24 NOTE — Telephone Encounter (Signed)
Called and spoke with nick and gave Vo

## 2013-07-29 LAB — PULMONARY FUNCTION TEST
DL/VA % pred: 47 %
DL/VA: 2.27 ml/min/mmHg/L
DLCO unc: 5.39 ml/min/mmHg
FEF 25-75 Post: 0.45 L/sec
FEF2575-%Change-Post: 1 %
FEV1-%Change-Post: 0 %
FEV1-%Pred-Pre: 40 %
FEV1-Post: 0.8 L
FEV1FVC-%Change-Post: 7 %
FEV1FVC-%Pred-Pre: 87 %
FEV6-%Change-Post: -5 %
FEV6-Pre: 1.16 L
FVC-%Pred-Post: 43 %
Post FEV1/FVC ratio: 73 %
Post FEV6/FVC ratio: 100 %
RV % pred: 64 %
RV: 1.36 L

## 2013-09-15 ENCOUNTER — Other Ambulatory Visit: Payer: Self-pay

## 2013-09-15 DIAGNOSIS — Z1231 Encounter for screening mammogram for malignant neoplasm of breast: Secondary | ICD-10-CM

## 2013-10-06 ENCOUNTER — Ambulatory Visit
Admission: RE | Admit: 2013-10-06 | Discharge: 2013-10-06 | Disposition: A | Discharge status: Home / Self Care | Payer: Medicare Other | Source: Ambulatory Visit

## 2013-10-06 DIAGNOSIS — Z1231 Encounter for screening mammogram for malignant neoplasm of breast: Secondary | ICD-10-CM

## 2013-10-21 ENCOUNTER — Ambulatory Visit (HOSPITAL_COMMUNITY)
Admission: RE | Admit: 2013-10-21 | Discharge: 2013-10-21 | Disposition: A | Discharge status: Home / Self Care | Payer: Medicare Other | Source: Ambulatory Visit | Attending: Internal Medicine | Admitting: Internal Medicine

## 2013-10-21 ENCOUNTER — Encounter (HOSPITAL_COMMUNITY): Payer: Self-pay

## 2013-10-21 VITALS — BP 108/64 | HR 57 | Wt 221.8 lb

## 2013-10-21 DIAGNOSIS — G4733 Obstructive sleep apnea (adult) (pediatric): Secondary | ICD-10-CM

## 2013-10-21 DIAGNOSIS — N289 Disorder of kidney and ureter, unspecified: Secondary | ICD-10-CM | POA: Insufficient documentation

## 2013-10-21 DIAGNOSIS — J449 Chronic obstructive pulmonary disease, unspecified: Secondary | ICD-10-CM

## 2013-10-21 DIAGNOSIS — Z9989 Dependence on other enabling machines and devices: Secondary | ICD-10-CM

## 2013-10-21 DIAGNOSIS — J4489 Other specified chronic obstructive pulmonary disease: Secondary | ICD-10-CM | POA: Insufficient documentation

## 2013-10-21 DIAGNOSIS — IMO0002 Reserved for concepts with insufficient information to code with codable children: Secondary | ICD-10-CM

## 2013-10-21 DIAGNOSIS — I2789 Other specified pulmonary heart diseases: Secondary | ICD-10-CM

## 2013-10-21 LAB — COMPREHENSIVE METABOLIC PANEL
ALT: 9 U/L (ref 0–35)
AST: 15 U/L (ref 0–37)
Albumin: 3.5 g/dL (ref 3.5–5.2)
Alkaline Phosphatase: 70 U/L (ref 39–117)
BUN: 50 mg/dL — AB (ref 6–23)
CALCIUM: 10.4 mg/dL (ref 8.4–10.5)
CO2: 28 meq/L (ref 19–32)
CREATININE: 1.61 mg/dL — AB (ref 0.50–1.10)
Chloride: 101 mEq/L (ref 96–112)
GFR, EST AFRICAN AMERICAN: 38 mL/min — AB (ref >90)
GFR, EST NON AFRICAN AMERICAN: 33 mL/min — AB (ref >90)
GLUCOSE: 134 mg/dL — AB (ref 70–99)
Potassium: 4.3 mEq/L (ref 3.7–5.3)
Sodium: 142 mEq/L (ref 137–147)
Total Bilirubin: <0.2 mg/dL — ABNORMAL LOW (ref 0.3–1.2)
Total Protein: 7.7 g/dL (ref 6.0–8.3)

## 2013-10-21 LAB — PRO B NATRIURETIC PEPTIDE: PRO B NATRI PEPTIDE: 1912 pg/mL — AB (ref 0–125)

## 2013-10-21 NOTE — Patient Instructions (Signed)
Labs today  You need to have monthly lab work to check your liver function  We will contact you in 3 months to schedule your next appointment.

## 2013-10-21 NOTE — Progress Notes (Signed)
Patient ID: April Mueller, female   DOB: 1945/12/26, 68 y.o.   MRN: [id number]  HPI  PCP: Dr Harrington Challenger [facility]monary: Dr Lamonte Sakai   April Mueller is a 68 yo with following PMHx  1) COPD, severe       --former smoker (28 pk-yrs)       --on home O2 - 4L/min rest, up to 5L/min exertion.        --spirometry 6/13 in [place] = 0.85L FVC = 1.3L no DLCO measured. FEV1 in 2014 = 0.73       --CT chest 10/14 - moderate centrilobular emphysema. No pulm fibrosis. Stable Asc Ao repair       --PFTs (11/14) FVC 46%, FEV1 40%, ratio 87%, DLCO 22%, TLC 50% => severe combined restriction/obstruction.  2) Obesity 3) RA       --on plaquenil 4) Ascending aortic aneurysm s/p repair 2005     --c/b renal failure requiring HD x 3 months 5) PAH - previously treated in [place].  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      --on bosentan (revatio stopped 10/14).  Did poorly when tired to stop bosentan in 11/14.      --V/Q scan 11/14 no chronic PE 6) OSA 7) Diastolic CHF    --ECHO 0/93 EF 55-60% RV mildly dilated with normal function. Mild to moderate posterior MR. RVSP 56. Probable restrictive diastolic filling pattern 8) CKD  Moved back from [place] and saw Dr. Lamonte Sakai. In [place] was followed by cardiology and pulmonary. Started on Tracleer in 2006. Revatio was added, however was discontinued without any change in her symptoms. Has not had a RHC in several years. Doesn't feel that she felt better with meds but she says they kept her from getting worse. Stopped smoking in January 2005 when she had aortic aneurysm repair. Smoked 1.5 ppd x 15 - 20 years.   Follow up: After last appointment, bosentan was stopped as she was felt to have WHO group II and III pulmonary hypertension (not group I).  She felt considerably worse off bosentan and restarted it.  She now feels like she is back to her baseline. She can walk about 30 yards before stopping. Denies dizziness, orthopnea, edema or PND.  She does not feel like she is retaining fluid.  Weight is up 6 lbs on our  scale but she has not been very active. At grocery store if leaning on shopping cart can go around the whole store. Compliant with CPAP and medications. Following a low salt diet. On chronic 4L home oxygen.  Labs 02/05/13 K 3.8 Creatinine 1.37 05/08/13 K 3.8, Creatinine 1.58 11/14 K 4.1, creatinine 1.82   Past Medical History  Diagnosis Date  . Hypertension   . Pulmonary HTN   . Aortic aneurysm   . Kidney disorder   . Sleep apnea     wears CPAP   Current Outpatient Prescriptions on File Prior to Encounter  Medication Sig Dispense Refill  . allopurinol (ZYLOPRIM) 300 MG tablet Take 300 mg by mouth daily.      [name] ALPRAZolam (XANAX) 0.25 MG tablet Take 0.25 mg by mouth as needed for sleep.      [name] aspirin 81 MG tablet Take 81 mg by mouth daily.      [name] atorvastatin (LIPITOR) 10 MG tablet Take 1 tablet (10 mg total) by mouth daily.  30 tablet  3  . Azilsartan Medoxomil (EDARBI) 80 MG TABS Take 1 tablet (80 mg total) by mouth daily.  90 tablet  3  .  bosentan (TRACLEER) 125 MG tablet Take 1 tablet (125 mg total) by mouth 2 (two) times daily.  60 tablet  5  . ferrous gluconate (FERGON) 325 MG tablet Take 325 mg by mouth daily with breakfast.      . fish oil-omega-3 fatty acids 1000 MG capsule Take 1,200 g by mouth daily.       . furosemide (LASIX) 40 MG tablet Take 1 tablet (40 mg total) by mouth 2 (two) times daily.  180 tablet  3  . hydroxychloroquine (PLAQUENIL) 200 MG tablet Take 200 mg by mouth 2 (two) times daily.      . metolazone (ZAROXOLYN) 2.5 MG tablet Take 1 tablet (2.5 mg total) by mouth 2 (two) times a week.  8 tablet  3  . metoprolol succinate (TOPROL-XL) 100 MG 24 hr tablet Take 1 tablet (100 mg total) by mouth 2 (two) times daily. Take with or immediately following a meal.  60 tablet  3  . Potassium Chloride ER 20 MEQ TBCR Take 20 mEq by mouth 2 (two) times daily. And as needed  180 tablet  3  . Umeclidinium-Vilanterol (ANORO ELLIPTA) 62.5-25 MCG/INH AEPB Inhale 1 puff into the  lungs daily.       No current facility-administered medications on file prior to encounter.   History   Social History  . Marital Status: Married    Spouse Name: N/A    Number of Children: 1  . Years of Education: N/A   Occupational History  . Disability     Office Work   Social History Main Topics  . Smoking status: Former Smoker -- 2.00 packs/day for 14 years    Types: Cigarettes    Quit date: 08/09/2003  . Smokeless tobacco: Never Used  . Alcohol Use: No  . Drug Use: No  . Sexual Activity: None   Other Topics Concern  . None   Social History Narrative  . None   Family History  Problem Relation Age of Onset  . Heart attack Mother   . Cancer Father     prostate  . Diabetes Brother      BMET    Component Value Date/Time   NA 142 10/21/2013 0953   K 4.3 10/21/2013 0953   CL 101 10/21/2013 0953   CO2 28 10/21/2013 0953   GLUCOSE 134* 10/21/2013 0953   BUN 50* 10/21/2013 0953   CREATININE 1.61* 10/21/2013 0953   CALCIUM 10.4 10/21/2013 0953   GFRNONAA 33* 10/21/2013 0953   GFRAA 38* 10/21/2013 0953   Hepatic Function Panel     Component Value Date/Time   PROT 7.7 10/21/2013 0953   ALBUMIN 3.5 10/21/2013 0953   AST 15 10/21/2013 0953   ALT 9 10/21/2013 0953   ALKPHOS 70 10/21/2013 0953   BILITOT <0.2* 10/21/2013 0953   BILIDIR 0.1 01/20/2013 1429    Filed Vitals:   10/21/13 0911  BP: 108/64  Pulse: 57  Weight: 221 lb 12.8 oz (100.608 kg)  SpO2: 91%   Physical Exam Gen: Pleasant, well-nourished, in no distress, normal affect; on 4L O2 HEENT: normal Neck: Dilated EJ, IJ pulsations difficult to visualized. Carotids 2+ with bilateral radiated bruits Lungs: Decreased BS throughout. No use of accessory muscles, distant, clear without rales or rhonchi Cardiovascular: well healed sternotomy scar  PMI normal. RRR, heart sounds normal, 2/6 SEM RUSB radiated to carotids Extremities: No deformities, no cyanosis or clubbing, trace bilateral edema  Neuro: alert, non  focal. Cranial nerves intact. Moves all 4 extremities without  difficulty Skin: Warm, no lesions or rashes   1. 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  She stopped Revatio without any worsening of symptoms.  However, she got much more short of breath off bosentan, and this was restarted. She is now back to baseline.  - Not able to do 6 min walk today as she has to get [employer] bus. - Continue oxygen 24/7. Volume status ok, will continue current dose of diuretics.    - Given worsening of symptoms off bosentan, I will have her continue this medication. I will have her get LFTs today and asked her to get monthly LFTs (will have standing orders). She has not been very compliant with this but LFTs have been normal for years.  - BNP today - I will have her followup in 3 months, she will need to have an echo set up for later this year at that appointment.  2. OSA: Continue CPAP nightly.  3. Chronic diastolic CHF: Weight is up a bit but she does not feel like she is retaining fluid.  Exam is relatively benign.  I will have her continue current lasix and metolazone dosing for now.  4. CKD: Creatinine was higher in 11/14 than in the past.  Will need BMET today.  5. COPD: Severe, on home oxygen.  Followed by Dr. Lamonte Sakai.    April Mueller 10/21/2013

## 2013-10-26 ENCOUNTER — Telehealth (HOSPITAL_COMMUNITY): Payer: Self-pay | Admitting: Cardiology

## 2013-10-26 NOTE — Telephone Encounter (Signed)
PT CALLED TO REQUEST LAB RESULTS  PT SEEN LABS ON MY CHART AND WOULD LIKE SOME ONE TO REVIEW THEM WITH HER IN DETAIL

## 2013-10-26 NOTE — Telephone Encounter (Signed)
Reviewed labs with pt. 

## 2013-11-25 ENCOUNTER — Ambulatory Visit (INDEPENDENT_AMBULATORY_CARE_PROVIDER_SITE_OTHER): Payer: Medicare Other | Admitting: Emergency Medicine

## 2013-11-25 ENCOUNTER — Other Ambulatory Visit (INDEPENDENT_AMBULATORY_CARE_PROVIDER_SITE_OTHER): Payer: Medicare Other

## 2013-11-25 ENCOUNTER — Encounter: Payer: Self-pay | Admitting: Emergency Medicine

## 2013-11-25 ENCOUNTER — Telehealth: Payer: Self-pay | Admitting: Emergency Medicine

## 2013-11-25 VITALS — BP 118/62 | HR 58 | Ht 64.0 in | Wt 222.0 lb

## 2013-11-25 DIAGNOSIS — J449 Chronic obstructive pulmonary disease, unspecified: Secondary | ICD-10-CM

## 2013-11-25 DIAGNOSIS — I2789 Other specified pulmonary heart diseases: Secondary | ICD-10-CM

## 2013-11-25 DIAGNOSIS — Z9989 Dependence on other enabling machines and devices: Secondary | ICD-10-CM

## 2013-11-25 DIAGNOSIS — IMO0002 Reserved for concepts with insufficient information to code with codable children: Secondary | ICD-10-CM

## 2013-11-25 DIAGNOSIS — J4489 Other specified chronic obstructive pulmonary disease: Secondary | ICD-10-CM

## 2013-11-25 DIAGNOSIS — G4733 Obstructive sleep apnea (adult) (pediatric): Secondary | ICD-10-CM

## 2013-11-25 LAB — BASIC METABOLIC PANEL
BUN: 55 mg/dL — AB (ref 6–23)
CO2: 30 mEq/L (ref 19–32)
Calcium: 10.1 mg/dL (ref 8.4–10.5)
Chloride: 106 mEq/L (ref 96–112)
Creatinine, Ser: 1.7 mg/dL — ABNORMAL HIGH (ref 0.4–1.2)
GFR: 39.75 mL/min — AB (ref >60.00)
Glucose, Bld: 110 mg/dL — ABNORMAL HIGH (ref 70–99)
POTASSIUM: 3.9 meq/L (ref 3.5–5.1)
SODIUM: 141 meq/L (ref 135–145)

## 2013-11-25 LAB — HEPATIC FUNCTION PANEL
ALK PHOS: 62 U/L (ref 39–117)
ALT: 14 U/L (ref 0–35)
AST: 20 U/L (ref 0–37)
Albumin: 3.5 g/dL (ref 3.5–5.2)
BILIRUBIN DIRECT: 0.1 mg/dL (ref 0.0–0.3)
BILIRUBIN TOTAL: 0.4 mg/dL (ref 0.3–1.2)
Total Protein: 7.6 g/dL (ref 6.0–8.3)

## 2013-11-25 NOTE — Telephone Encounter (Signed)
Spoke with pt and advise her of lab results per RB and his rec's  Pt verbalized understanding and had no further questions

## 2013-11-25 NOTE — Assessment & Plan Note (Signed)
CPAP.  

## 2013-11-25 NOTE — Assessment & Plan Note (Signed)
Has never benefited or continued BD's

## 2013-11-25 NOTE — Assessment & Plan Note (Signed)
We will continue bosentan w monthly LFT Continue CPAP and O2 rov 4

## 2013-11-25 NOTE — Patient Instructions (Signed)
We will continue your current medications Lab work today Wear your oxygen at all times Use your CPAP every night Follow with Dr Lamonte Sakai in 4 months or sooner if you have any problems.

## 2013-11-25 NOTE — Progress Notes (Signed)
Subjective:    Patient ID: April Mueller, female    DOB: 07-16-1946, 68 y.o.   MRN: [id number]  HPI 68 yo, former smoker (28 pk-yrs), hx of HTN, OSA on CPAP, Rheumatoid arthritis on plaquenil. Hx AAA 2005 c/b ARF and 3 months of HD (now off). 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 She moved back to [place] from [place] in 8/13, is referred by Dr Melinda Crutch for [facility] that has been managed on bosentan 125mg  bid + sildenifil 20mg  tid, supplemental O2 4L/min rest, up to 5L/min exertion.   She reports a worsening in her dyspnea over the last 3 months. No CP. No wheeze or cough. She is able to exert some, able to walk when she takes her time. Her nose is congested. She had epistaxis in Jan, went to [place]. Has recurred some, not currently.   She has had PFT, CT scan chest, R heart cath, echocardiograms in [place] > reviewed 12/11/12   ROV 66/8/14 -- f/u for COPD, RA, OSA and secondary PAH. 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 She is planning to see Dr Trudie Reed for Rheum, hasn't done so yet. She has been clinically stable. Using lasix 40 daily, increased zaroxolyn on her own to about 3 x a week. Has been doing this for a week.   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 S/p TTE  As below > PASP 56, L atrial enlargement w mild to mod MR.   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 Here for f/u. On bosentan, stopped sildenifil and she didn't miss it. I asked her to see Dr Haroldine Laws regarding her moderate MR - no indication for MVR (and she is a poor candidate). 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 Last time we discussed stopping tracleer, also added Anoro to see if she would benefit. She did not benefit from the anoro.  The tracleer was tapered down in discussion with Dr Haroldine Laws, but she did not tolerate > had increased SOB, decreased functional capacity and restarted. LFT normal on 11/26. She had V/q scan 11/28 >> low prob study. Some subjective wt gain, ? How much. Will order wheeled walker through [place].   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 She is on O2, bosentan, CPAP. She is on metolazone and lasix, stable doses. Edema has been stable. She did not benefit from inhaled BD's. Last TTE was 5/'14. She is on 4L/min at all times. She has good compliance with CPAP.    Review of Systems  Constitutional: Negative for fever and unexpected weight change.  HENT: Negative for congestion, dental problem, ear pain, nosebleeds, postnasal drip, rhinorrhea, sinus pressure, sneezing, sore throat and trouble swallowing.   Eyes: Negative for redness and itching.  Respiratory: Positive for shortness of breath. Negative for cough, chest tightness and wheezing.   Cardiovascular: Positive for leg swelling. Negative for palpitations.  Gastrointestinal: Negative for nausea, vomiting and diarrhea.  Genitourinary: Negative for dysuria.  Musculoskeletal: Negative for joint swelling.  Skin: Negative for rash.  Neurological: Negative for headaches.  Hematological: Bruises/bleeds easily.  Psychiatric/Behavioral: Negative for dysphoric  mood. The patient is not nervous/anxious.    BMET Hepatic Function Panel     Component Value Date/Time   PROT 7.7 10/21/2013 0953   ALBUMIN 3.5 10/21/2013 0953   AST 15 10/21/2013 0953   ALT 9 10/21/2013 0953   ALKPHOS 70 10/21/2013 0953   BILITOT <0.2* 10/21/2013 0953   BILIDIR 0.1 01/20/2013 1429       Objective:   Physical Exam Filed Vitals:   11/25/13 1025  BP: 118/62  Pulse: 58  Height: 5\' 4"  (1.626 m)  Weight: 222 lb (100.699 kg)  SpO2: 98%   Gen: Pleasant, well-nourished, in no distress,  normal affect  ENT: No lesions,  mouth clear,   oropharynx clear, no postnasal drip  Neck: No JVD, no TMG, no carotid bruits  Lungs: No use of accessory muscles, distant, clear without rales or rhonchi  Cardiovascular: RRR, heart sounds normal, no murmur or gallops, no peripheral edema  Musculoskeletal: No deformities, no cyanosis or clubbing, trace to 1+ pretibial edema  Neuro: alert, non focal  Skin: Warm, no lesions or rashes  Study Conclusions   12/17/12 --  - Left ventricle: The cavity size was normal. Wall thickness was normal. Systolic function was normal. The estimated ejection fraction was in the range of 55% to 60%. Wall motion was normal; there were no regional wall motion abnormalities. - Mitral valve: MR is eccentric and directed posteriorl somewhat hard to judge extent Mildly calcified annulus. Mildly thickened leaflets . Mild to moderate regurgitation. - Left atrium: The atrium was mildly dilated. - Right ventricle: The cavity size was mildly dilated. - Right atrium: The atrium was mildly dilated. - Atrial septum: The septum bowed from left to right, consistent with increased left atrial pressure. - Pulmonary arteries: PA peak pressure: 79mm Hg (S).      Assessment & Plan:  Secondary pulmonary hypertension We will continue bosentan w monthly LFT Continue CPAP and O2 rov 4   OSA on CPAP CPAP  COPD (chronic obstructive pulmonary disease) Has never benefited or continued BD's

## 2014-01-09 ENCOUNTER — Other Ambulatory Visit (HOSPITAL_COMMUNITY): Payer: Self-pay | Admitting: Internal Medicine

## 2014-01-18 ENCOUNTER — Other Ambulatory Visit (HOSPITAL_COMMUNITY): Payer: Self-pay | Admitting: Cardiology

## 2014-01-18 DIAGNOSIS — I10 Essential (primary) hypertension: Secondary | ICD-10-CM

## 2014-01-18 MED ORDER — AZILSARTAN MEDOXOMIL 80 MG PO TABS: 80.0000 mg | ORAL_TABLET | Freq: Every day | ORAL | Status: DC

## 2014-02-11 ENCOUNTER — Telehealth: Payer: Self-pay | Admitting: Emergency Medicine

## 2014-02-11 NOTE — Telephone Encounter (Signed)
Pt returned call

## 2014-02-11 NOTE — Telephone Encounter (Signed)
Called and LMTCB Pt needs appointment with RB to discuss medications

## 2014-02-11 NOTE — Telephone Encounter (Signed)
Speak with RB per pt had labs done on 02/04/14 states can not change from Tracleer it's been tried before.

## 2014-02-11 NOTE — Telephone Encounter (Signed)
Called spoke with pt. She has pending appt 03/30/14. Pt reports what about her medications needs to be discussed? She reports Dr. Harrington Challenger has changed some of her medications already. Please advise RB thanks

## 2014-02-12 NOTE — Telephone Encounter (Signed)
I need to see her blood work every month, have not yet seen recent labs from Dr Harrington Challenger' office. She has been weaned off bosentan before but I do not believe she has ever been changed to [place], which is the med I would choose. I would like for her to set up a visit to discuss this. I can't sign her Tracleer renewal paperwork until I see labs. The benefit of converting to letaris would be that she would not need labwork.

## 2014-02-12 NOTE — Telephone Encounter (Signed)
Spoke with pt and advised of RB's rec's and that appointment needed to made. Appointment scheduled for 03/30/14. Pt to fax recent labs to [phone] attn: Harmony Sandell for RB to review so he can renew her rx. Awaiting labs

## 2014-02-16 NOTE — Telephone Encounter (Signed)
RB has reviewed recent labs and filled out rx for Tracleer. This has been faxed and pt aware. Nothing further needed

## 2014-03-22 ENCOUNTER — Telehealth (HOSPITAL_COMMUNITY): Payer: Self-pay | Admitting: Vascular Surgery

## 2014-03-22 DIAGNOSIS — I2789 Other specified pulmonary heart diseases: Secondary | ICD-10-CM

## 2014-03-22 MED ORDER — METOPROLOL SUCCINATE ER 100 MG PO TB24: 100.0000 mg | ORAL_TABLET | Freq: Two times a day (BID) | ORAL | Status: DC

## 2014-03-22 MED ORDER — ATORVASTATIN CALCIUM 10 MG PO TABS: 10.0000 mg | ORAL_TABLET | Freq: Every day | ORAL | Status: DC

## 2014-03-22 NOTE — Telephone Encounter (Signed)
rx sent in, pt aware 

## 2014-03-22 NOTE — Telephone Encounter (Signed)
New prescription [facility]atin [facility]

## 2014-03-30 ENCOUNTER — Ambulatory Visit (INDEPENDENT_AMBULATORY_CARE_PROVIDER_SITE_OTHER): Payer: Medicare Other | Admitting: Emergency Medicine

## 2014-03-30 ENCOUNTER — Telehealth: Payer: Self-pay | Admitting: Emergency Medicine

## 2014-03-30 ENCOUNTER — Encounter: Payer: Self-pay | Admitting: Emergency Medicine

## 2014-03-30 VITALS — BP 108/60 | HR 58 | Temp 98.2°F | Ht 65.0 in | Wt 223.6 lb

## 2014-03-30 DIAGNOSIS — G4733 Obstructive sleep apnea (adult) (pediatric): Secondary | ICD-10-CM

## 2014-03-30 DIAGNOSIS — IMO0002 Reserved for concepts with insufficient information to code with codable children: Secondary | ICD-10-CM

## 2014-03-30 DIAGNOSIS — J449 Chronic obstructive pulmonary disease, unspecified: Secondary | ICD-10-CM

## 2014-03-30 DIAGNOSIS — I2789 Other specified pulmonary heart diseases: Secondary | ICD-10-CM

## 2014-03-30 DIAGNOSIS — Z9989 Dependence on other enabling machines and devices: Secondary | ICD-10-CM

## 2014-03-30 NOTE — Patient Instructions (Signed)
We will initiate the paperwork to start Opsumit as a substitute for your Tracleer.  CONTINUE your Tracleer for now. We will discuss the change when the specialty pharmacy has medication available.  Follow with Dr Lamonte Sakai in 1 month

## 2014-03-30 NOTE — Telephone Encounter (Signed)
Error

## 2014-03-30 NOTE — Assessment & Plan Note (Signed)
Continue CPAP as ordered 

## 2014-03-30 NOTE — Progress Notes (Signed)
Subjective:    Patient ID: April Mueller, female    DOB: 1945/09/16, 68 y.o.   MRN: [id number]  HPI 68 yo, former smoker (28 pk-yrs), hx of HTN, OSA on CPAP, Rheumatoid arthritis on plaquenil. Hx AAA 2005 c/b ARF and 3 months of HD (now off). 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 She moved back to [place] from [place] in 8/13, is referred by Dr Melinda Crutch for [facility] that has been managed on bosentan 153m bid + sildenifil 216mtid, supplemental O2 4L/min rest, up to 5L/min exertion.   She reports a worsening in her dyspnea over the last 3 months. No CP. No wheeze or cough. She is able to exert some, able to walk when she takes her time. Her nose is congested. [place]   She has had PFT, CT scan chest, R heart cath, [place]   ROV 12/11/12 -- f/u for COPD, RA, OSA and secondary PAH. 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 She is planning to see Dr HaTrudie Reedor Rheum, hasn't done so yet. She has been clinically stable. Using lasix 40 daily, increased zaroxolyn on her own to about 3 x a week. Has been doing this for a week.   ROV 01/20/13 -- COPD, RA, OSA and secondary PAH, severe [place] FEV[place] 0.[place]L [place]  [place] below > PASP 56, L atrial enlargement w mild to mod MR.   ROV 06/09/13 -- COPD, RA, OSA and secondary PAH (PASP 56), severe [place] FEV[place]L [place] On bosentan, stopped sildenifil and she didn't miss it. I asked her to see Dr BeHaroldine Lawsegarding her moderate MR - no indication for MVR (and she is a poor candidate). 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    ROV 07/16/13 -- COPD, RA, OSA and secondary PAH (PASP 56), severe [place] FEV[place] 0.[place]L [place], also added Anoro to see if she would benefit. She did not benefit from the anoro.  The tracleer was tapered down in discussion with Dr BeHaroldine Lawsbut she did not tolerate > had increased SOB, decreased functional capacity and restarted. LFT normal on 11/26. She had V/q scan 11/28 >> low prob study. Some subjective wt gain, ? How much. [place]  ROV 11/25/13 -- COPD, RA, OSA and secondary PAH (PASP 56), severe AFL w FEV1 0.65L. She is on O2, bosentan, CPAP. She is on metolazone and lasix, stable doses. Edema has been stable. She did not benefit from inhaled BD's. Last TTE was 5/'14. She is on 4L/min at all times. She has good compliance with CPAP.   ROV 03/30/14 -- follows for secondary PAH due to severe obstructive disease, RA, OSA. She has not noticed benefit from BD's despite her PFT.  Her V/q was low prob, her CT scan did not show ILD. She is on Tracleer, last LFT were done by Dr HaTrudie Reedn 8/'15. She is feeling stable, wearing O2 and CPAP   Review of Systems  Constitutional: Negative for fever and unexpected weight change.  HENT: Negative for congestion, dental problem, ear pain, nosebleeds, postnasal drip, rhinorrhea, sinus pressure, sneezing, sore throat and trouble swallowing.   Eyes: Negative for redness and itching.  Respiratory: Positive  for shortness of breath. Negative for cough, chest tightness and wheezing.   Cardiovascular: Positive for leg swelling. Negative for palpitations.  Gastrointestinal: Negative for nausea, vomiting and diarrhea.  Genitourinary: Negative for dysuria.  Musculoskeletal: Negative for joint swelling.  Skin: Negative for rash.  Neurological: Negative for headaches.  Hematological: Bruises/bleeds easily.  Psychiatric/Behavioral: Negative for dysphoric mood. The patient is not nervous/anxious.    Hepatic Function Latest Ref Rng 11/25/2013 10/21/2013 07/01/2013  Total Protein 6.0 - 8.3 g/dL 7.6 7.7 7.9  Albumin 3.5 - 5.2 g/dL 3.5 3.5 3.5  AST 0 - 37 U/L 20 15 14   ALT 0 - 35 U/L 14 9 9   Alk Phosphatase 39 - 117  U/L 62 70 76  Total Bilirubin 0.3 - 1.2 mg/dL 0.4 <0.2(L) 0.3  Bilirubin, Direct 0.0 - 0.3 mg/dL 0.1 - -        Objective:   Physical Exam Filed Vitals:   03/30/14 0915  BP: 108/60  Pulse: 58  Temp: 98.2 F (36.8 C)  TempSrc: Oral  Height: 5' 5"  (1.651 m)  Weight: 223 lb 9.6 oz (101.424 kg)  SpO2: 93%   Gen: Pleasant, well-nourished, in no distress,  normal affect  ENT: No lesions,  mouth clear,  oropharynx clear, no postnasal drip  Neck: No JVD, no TMG, no carotid bruits  Lungs: No use of accessory muscles, distant, clear without rales or rhonchi  Cardiovascular: RRR, heart sounds normal, no murmur or gallops, no peripheral edema  Musculoskeletal: No deformities, no cyanosis or clubbing, trace to 1+ pretibial edema  Neuro: alert, non focal  Skin: Warm, no lesions or rashes    Study Conclusions 12/17/12 --  - Left ventricle: The cavity size was normal. Wall thickness was normal. Systolic function was normal. The estimated ejection fraction was in the range of 55% to 60%. Wall motion was normal; there were no regional wall motion abnormalities. - Mitral valve: MR is eccentric and directed posteriorl somewhat hard to judge extent Mildly calcified annulus. Mildly thickened leaflets . Mild to moderate regurgitation. - Left atrium: The atrium was mildly dilated. - Right ventricle: The cavity size was mildly dilated. - Right atrium: The atrium was mildly dilated. - Atrial septum: The septum bowed from left to right, consistent with increased left atrial pressure. - Pulmonary arteries: PA peak pressure: 78m Hg (S).       Assessment & Plan:  Secondary pulmonary hypertension Continue to treat her with CPAP, O2, diuretics. Discussed today transitioning from Tracleer to Opsumit. I believe this may benefit her and it will allow her to avoid LFT labs. Will initiate paperwork process.   OSA on CPAP Continue CPAP as ordered  COPD (chronic obstructive pulmonary  disease) She has not benefited from BDs. Could consider re-trying in the future (PFT suggest she should benefit)

## 2014-03-30 NOTE — Assessment & Plan Note (Signed)
Continue to treat her with CPAP, O2, diuretics. Discussed today transitioning from Tracleer to Opsumit. I believe this may benefit her and it will allow her to avoid LFT labs. Will initiate paperwork process.

## 2014-03-30 NOTE — Assessment & Plan Note (Signed)
She has not benefited from BDs. Could consider re-trying in the future (PFT suggest she should benefit)

## 2014-03-31 ENCOUNTER — Ambulatory Visit (HOSPITAL_COMMUNITY)
Admission: RE | Admit: 2014-03-31 | Discharge: 2014-03-31 | Disposition: A | Discharge status: Home / Self Care | Payer: Medicare Other | Source: Ambulatory Visit | Attending: Internal Medicine | Admitting: Internal Medicine

## 2014-03-31 VITALS — BP 116/72 | HR 89 | Wt 221.0 lb

## 2014-03-31 DIAGNOSIS — I2789 Other specified pulmonary heart diseases: Secondary | ICD-10-CM | POA: Diagnosis present

## 2014-03-31 DIAGNOSIS — IMO0002 Reserved for concepts with insufficient information to code with codable children: Secondary | ICD-10-CM

## 2014-03-31 DIAGNOSIS — I5032 Chronic diastolic (congestive) heart failure: Secondary | ICD-10-CM

## 2014-03-31 NOTE — Patient Instructions (Signed)
Follow up in 2 weeks with an ECHO and a 6 minutes walk  Follow up with Dr Haroldine Laws in 4 months

## 2014-03-31 NOTE — Progress Notes (Signed)
Patient ID: April Mueller, female   DOB: 12/06/1945, 68 y.o.   MRN: [id number]   HPI  PCP: Dr April Mueller Pulmonary: Dr April Mueller   April Mueller is a 68 yo with following PMHx  1) COPD, severe       --former smoker (28 pk-yrs)       --on home O2 - 4L/min rest, up to 5L/min exertion.        --spirometry 6/13 in [place] = 0.85L FVC = 1.3L no DLCO measured. FEV1 in 2014 = 0.73       --CT chest 10/14 - moderate centrilobular emphysema. No pulm fibrosis. Stable Asc Ao repair       --PFTs (11/14) FVC 46%, FEV1 40%, ratio 87%, DLCO 22%, TLC 50% => severe combined restriction/obstruction.  2) Obesity 3) RA       --on plaquenil 4) Ascending aortic aneurysm s/p repair 2005     --c/b renal failure requiring HD x 3 months 5) PAH - previously treated in [place].  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      --on bosentan (revatio stopped 10/14).  Did poorly when tired to stop bosentan in 11/14.      --V/Q scan 11/14 no chronic PE 6) OSA 7) Diastolic CHF    --ECHO 4/40 EF 55-60% RV mildly dilated with normal function. Mild to moderate posterior MR. RVSP 56. Probable restrictive diastolic filling pattern 8) CKD  Moved back from [place] and saw Dr. Lamonte Mueller. In [place] was followed by cardiology and pulmonary. Started on Tracleer in 2006. Revatio was added, however was discontinued without any change in her symptoms. Has not had a RHC in several years. Doesn't feel that she felt better with meds but she says they kept her from getting worse. Stopped smoking in January 2005 when she had aortic aneurysm repair. Smoked 1.5 ppd x 15 - 20 years.   Follow up: Overall she is feeling ok. Yesterday she was evaluated by Dr April Mueller and he plans stop traclear and start opsumit. Breathing at baseline. Able to walk short distances. Frequent breaks in the grocery store. + Orthopnea sleep on 2 pillows. Weight at home 220 pounds.  Compliant with CPAP and medications. Following a low salt diet. On chronic 5L home oxygen.  Labs 02/05/13 K 3.8 Creatinine 1.37 05/08/13  K 3.8, Creatinine 1.58 11/14 K 4.1, creatinine 1.82   Past Medical History  Diagnosis Date  . Hypertension   . Pulmonary HTN   . Aortic aneurysm   . Kidney disorder   . Sleep apnea     wears CPAP   Current Outpatient Prescriptions on File Prior to Encounter  Medication Sig Dispense Refill  . allopurinol (ZYLOPRIM) 300 MG tablet Take 300 mg by mouth daily.      [name] ALPRAZolam (XANAX) 0.25 MG tablet Take 0.25 mg by mouth as needed for sleep.      [name] aspirin 81 MG tablet Take 81 mg by mouth daily.      [name] atorvastatin (LIPITOR) 10 MG tablet Take 1 tablet (10 mg total) by mouth daily.  90 tablet  3  . Azilsartan Medoxomil (EDARBI) 80 MG TABS Take 1 tablet (80 mg total) by mouth daily.  90 tablet  3  . bosentan (TRACLEER) 125 MG tablet Take 1 tablet (125 mg total) by mouth 2 (two) times daily.  60 tablet  5  . ferrous gluconate (FERGON) 325 MG tablet Take 325 mg by mouth daily with breakfast.      . fish oil-omega-3 fatty acids 1000  MG capsule Take 1,200 g by mouth daily.       . furosemide (LASIX) 40 MG tablet Take 40 mg by mouth daily.      . hydroxychloroquine (PLAQUENIL) 200 MG tablet Take 200 mg by mouth 2 (two) times daily.      . metolazone (ZAROXOLYN) 2.5 MG tablet Take 1 tablet (2.5 mg total) by mouth 2 (two) times a week.  8 tablet  3  . metoprolol succinate (TOPROL-XL) 100 MG 24 hr tablet Take 1 tablet (100 mg total) by mouth 2 (two) times daily. Take with or immediately following a meal.  180 tablet  3   No current facility-administered medications on file prior to encounter.   History   Social History  . Marital Status: Married    Spouse Name: N/A    Number of Children: 1  . Years of Education: N/A   Occupational History  . Disability     Office Work   Social History Main Topics  . Smoking status: Former Smoker -- 2.00 packs/day for 14 years    Types: Cigarettes    Quit date: 08/09/2003  . Smokeless tobacco: Never Used  . Alcohol Use: No  . Drug Use: No  . Sexual  Activity: Not on file   Other Topics Concern  . Not on file   Social History Narrative  . No narrative on file   Family History  Problem Relation Age of Onset  . Heart attack Mother   . Cancer Father     prostate  . Diabetes Brother      BMET    Component Value Date/Time   NA 141 11/25/2013 1115   K 3.9 11/25/2013 1115   CL 106 11/25/2013 1115   CO2 30 11/25/2013 1115   GLUCOSE 110* 11/25/2013 1115   BUN 55* 11/25/2013 1115   CREATININE 1.7* 11/25/2013 1115   CALCIUM 10.1 11/25/2013 1115   GFRNONAA 33* 10/21/2013 0953   GFRAA 38* 10/21/2013 0953   Hepatic Function Panel     Component Value Date/Time   PROT 7.6 11/25/2013 1115   ALBUMIN 3.5 11/25/2013 1115   AST 20 11/25/2013 1115   ALT 14 11/25/2013 1115   ALKPHOS 62 11/25/2013 1115   BILITOT 0.4 11/25/2013 1115   BILIDIR 0.1 11/25/2013 1115    Filed Vitals:   03/31/14 1146  BP: 116/72  Pulse: 89  Weight: 221 lb (100.245 kg)  SpO2: 92%   Physical Exam Gen: Pleasant, well-nourished, in no distress, normal affect; on 5L O2 HEENT: normal Neck: Dilated EJ, IJ pulsations difficult to visualized. Carotids 2+ with bilateral radiated bruits Lungs: Decreased BS throughout. No use of accessory muscles, distant, clear without rales or rhonchi Cardiovascular: well healed sternotomy scar  PMI normal. RRR, heart sounds normal, 2/6 SEM RUSB radiated to carotids Extremities: No deformities, no cyanosis or clubbing, trace bilateral edema  Neuro: alert, non focal. Cranial nerves intact. Moves all 4 extremities without difficulty Skin: Warm, no lesions or rashes  1. 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  She stopped Revatio without any worsening of symptoms.  However, she got much more short of breath off bosentan, and this was restarted..  - Not able to do 6 min walk today as she has to get [employer] bus. - Continue oxygen 24/7. Volume status ok, will continue current dose  of diuretics.   - Continue  bosentan until opsumit available.  Repeat ECHO and  Check 6 mw at that time. If pressure higher will need RHC.  2. OSA: Continue CPAP nightly.  3. Chronic diastolic CHF: Volume status stable. Continue current diuretic regimen.  4. CKD: Creatinine was higher in 11/14 than in the past.  5. COPD: Severe, on home oxygen.  Followed by Dr. Lamonte Mueller.    CLEGG,AMY NP-C  03/31/2014  Patient seen and examined with Darrick Grinder, NP. We discussed all aspects of the encounter. I agree with the assessment and plan as stated above.   Volume status looks good. Dr. Lamonte Mueller currently switching from tracleer to macitentan. Will see how she responds. Repeat echo and 6MW. Low threshold for repeat RHC>   Jaevon Paras,MD 4:19 PM

## 2014-04-14 ENCOUNTER — Ambulatory Visit (HOSPITAL_COMMUNITY)
Admission: RE | Admit: 2014-04-14 | Discharge: 2014-04-14 | Disposition: A | Discharge status: Home / Self Care | Payer: Medicare Other | Source: Ambulatory Visit | Attending: Cardiology | Admitting: Cardiology

## 2014-04-14 ENCOUNTER — Ambulatory Visit (HOSPITAL_BASED_OUTPATIENT_CLINIC_OR_DEPARTMENT_OTHER)
Admission: RE | Admit: 2014-04-14 | Discharge: 2014-04-14 | Disposition: A | Discharge status: Home / Self Care | Payer: Medicare Other | Source: Ambulatory Visit | Attending: Cardiology | Admitting: Cardiology

## 2014-04-14 VITALS — BP 132/78 | HR 65 | Wt 220.8 lb

## 2014-04-14 DIAGNOSIS — I059 Rheumatic mitral valve disease, unspecified: Secondary | ICD-10-CM | POA: Insufficient documentation

## 2014-04-14 DIAGNOSIS — I079 Rheumatic tricuspid valve disease, unspecified: Secondary | ICD-10-CM | POA: Insufficient documentation

## 2014-04-14 DIAGNOSIS — Z87891 Personal history of nicotine dependence: Secondary | ICD-10-CM | POA: Insufficient documentation

## 2014-04-14 DIAGNOSIS — R002 Palpitations: Secondary | ICD-10-CM | POA: Insufficient documentation

## 2014-04-14 DIAGNOSIS — IMO0002 Reserved for concepts with insufficient information to code with codable children: Secondary | ICD-10-CM

## 2014-04-14 DIAGNOSIS — N289 Disorder of kidney and ureter, unspecified: Secondary | ICD-10-CM | POA: Diagnosis not present

## 2014-04-14 DIAGNOSIS — I2789 Other specified pulmonary heart diseases: Secondary | ICD-10-CM | POA: Insufficient documentation

## 2014-04-14 DIAGNOSIS — I517 Cardiomegaly: Secondary | ICD-10-CM | POA: Insufficient documentation

## 2014-04-14 DIAGNOSIS — I10 Essential (primary) hypertension: Secondary | ICD-10-CM | POA: Diagnosis not present

## 2014-04-14 DIAGNOSIS — I359 Nonrheumatic aortic valve disorder, unspecified: Secondary | ICD-10-CM | POA: Insufficient documentation

## 2014-04-14 DIAGNOSIS — J449 Chronic obstructive pulmonary disease, unspecified: Secondary | ICD-10-CM | POA: Insufficient documentation

## 2014-04-14 DIAGNOSIS — J4489 Other specified chronic obstructive pulmonary disease: Secondary | ICD-10-CM | POA: Insufficient documentation

## 2014-04-14 NOTE — Progress Notes (Signed)
Pt in for 6 min walk test, pt ambulated 400 ft in 6 min with very frequent rest breaks, her O2 sats ranged from 82-97% mainly running around 88% on 5 L oxygen, her HR ranged from 65-70.  Will send information to Dr Haroldine Laws for review

## 2014-04-16 NOTE — Progress Notes (Signed)
*  PRELIMINARY RESULTS* Echocardiogram 2D Echocardiogram has been performed.  Leavy Cella 04/16/2014, 7:49 AM

## 2014-04-19 ENCOUNTER — Telehealth (HOSPITAL_COMMUNITY): Payer: Self-pay | Admitting: Vascular Surgery

## 2014-04-19 NOTE — Telephone Encounter (Signed)
Please call pt Echo results.Marland KitchenMarland Kitchen

## 2014-04-19 NOTE — Telephone Encounter (Signed)
Will discuss w/DB and call pt tomorrow with results

## 2014-04-20 ENCOUNTER — Telehealth: Payer: Self-pay | Admitting: Emergency Medicine

## 2014-04-20 NOTE — Telephone Encounter (Signed)
Dr Lamonte Sakai, have you seen the PA form for this pt yet? Please advise thanks!

## 2014-04-21 NOTE — Telephone Encounter (Signed)
Per Dr Haroldine Laws sch pt for RHC, pt aware and agreeable however she states she does not have transportation, will arrange and call her back

## 2014-04-23 NOTE — Telephone Encounter (Signed)
Spoke with the rep at [facility]  She states that she is needing PA to be filled out  I advised checked the fax and notified that we did not receive the form  She will refax

## 2014-04-28 ENCOUNTER — Telehealth: Payer: Self-pay | Admitting: Emergency Medicine

## 2014-04-28 DIAGNOSIS — I5032 Chronic diastolic (congestive) heart failure: Secondary | ICD-10-CM | POA: Insufficient documentation

## 2014-04-28 NOTE — Telephone Encounter (Signed)
Per 04/23/14 the PA form has not been received yet.  Called spoke with [place]. She is going to re fax this to [place] in the triage.  She wants [place] to call once this is received and go over the PA form with her so we can get info included in this to get it approved.  Will await fax

## 2014-04-28 NOTE — Addendum Note (Signed)
Encounter addended by: Jolaine Artist, MD on: 04/28/2014  4:19 PM<BR>     Documentation filed: Problem List, Notes Section, Follow-up Section, LOS Section

## 2014-04-29 NOTE — Telephone Encounter (Signed)
PA form received and placed in RB look at.  Please advise RB thanks

## 2014-04-30 ENCOUNTER — Other Ambulatory Visit (INDEPENDENT_AMBULATORY_CARE_PROVIDER_SITE_OTHER): Payer: Medicare Other

## 2014-04-30 ENCOUNTER — Encounter: Payer: Self-pay | Admitting: Emergency Medicine

## 2014-04-30 ENCOUNTER — Ambulatory Visit (INDEPENDENT_AMBULATORY_CARE_PROVIDER_SITE_OTHER): Payer: Medicare Other | Admitting: Emergency Medicine

## 2014-04-30 VITALS — BP 116/72 | HR 59 | Temp 98.0°F | Ht 64.0 in | Wt 219.4 lb

## 2014-04-30 DIAGNOSIS — IMO0002 Reserved for concepts with insufficient information to code with codable children: Secondary | ICD-10-CM

## 2014-04-30 DIAGNOSIS — G4733 Obstructive sleep apnea (adult) (pediatric): Secondary | ICD-10-CM

## 2014-04-30 DIAGNOSIS — I2789 Other specified pulmonary heart diseases: Secondary | ICD-10-CM

## 2014-04-30 DIAGNOSIS — J449 Chronic obstructive pulmonary disease, unspecified: Secondary | ICD-10-CM

## 2014-04-30 DIAGNOSIS — Z9989 Dependence on other enabling machines and devices: Secondary | ICD-10-CM

## 2014-04-30 LAB — COMPREHENSIVE METABOLIC PANEL
ALT: 12 U/L (ref 0–35)
AST: 18 U/L (ref 0–37)
Albumin: 3.6 g/dL (ref 3.5–5.2)
Alkaline Phosphatase: 68 U/L (ref 39–117)
BILIRUBIN TOTAL: 0.4 mg/dL (ref 0.2–1.2)
BUN: 34 mg/dL — ABNORMAL HIGH (ref 6–23)
CO2: 28 mEq/L (ref 19–32)
Calcium: 10.3 mg/dL (ref 8.4–10.5)
Chloride: 103 mEq/L (ref 96–112)
Creatinine, Ser: 1.6 mg/dL — ABNORMAL HIGH (ref 0.4–1.2)
GFR: 40.83 mL/min — ABNORMAL LOW (ref >60.00)
GLUCOSE: 112 mg/dL — AB (ref 70–99)
POTASSIUM: 3.7 meq/L (ref 3.5–5.1)
Sodium: 137 mEq/L (ref 135–145)
TOTAL PROTEIN: 7.9 g/dL (ref 6.0–8.3)

## 2014-04-30 NOTE — Assessment & Plan Note (Signed)
We are about to transition from bosentan to macitentan. Hopefully she will benefit. I suspect we will need to consider repeat R heart cath and then inhaled prostacyclin. Her case is complicated - difficult to gauge which of her problems drives her dyspnea. She needs LFT today.

## 2014-04-30 NOTE — Assessment & Plan Note (Signed)
She has surprisingly never benefited from BD's. Question whether this is due to the impact of her hypoxemia. I would like to try BD's again in the future after we address her hypoxemia.

## 2014-04-30 NOTE — Patient Instructions (Signed)
We will have [employer] check on your CPAP machine; you may need a new machine We will need to consider increasing your oxygen and changing to a continuous flow. Will ask [employer] if we can accomplish this with oximizer tubing.  We will start Opsumit when the medication is available.  Lab work today Follow with Dr Lamonte Sakai in 1 month

## 2014-04-30 NOTE — Telephone Encounter (Signed)
Form completed, signed and faxed to # [phone]. Forms given to Janett Billow to hold and follow-up on. [place] Bing, CMA

## 2014-04-30 NOTE — Telephone Encounter (Signed)
Signed and faxed

## 2014-04-30 NOTE — Assessment & Plan Note (Signed)
Good compliance

## 2014-04-30 NOTE — Progress Notes (Signed)
Subjective:    Patient ID: April Mueller, female    DOB: Jul 22, 1946, 68 y.o.   MRN: [id number]  HPI 68 yo, former smoker (28 pk-yrs), hx of HTN, OSA on CPAP, Rheumatoid arthritis on plaquenil. Hx AAA 2005 c/b ARF and 3 months of HD (now off). 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 She moved back to [place] from [place] in 8/13, is referred by Dr Melinda Crutch for [facility] that has been managed on bosentan 129m bid + sildenifil 224mtid, supplemental O2 4L/min rest, up to 5L/min exertion.   She reports a worsening in her dyspnea over the last 3 months. No CP. No wheeze or cough. She is able to exert some, able to walk when she takes her time. Her nose is congested. [place] had [place]   She has had PFT, CT scan chest, R heart cath, [place]   ROV 12/11/12 -- f/u for COPD, RA, OSA and secondary PAH. 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 She is planning to see Dr HaTrudie Reedor Rheum, hasn't done so yet. She has been clinically stable. Using lasix 40 daily, increased zaroxolyn on her own to about 3 x a week. Has been doing this for a week.   ROV 01/20/13 -- COPD, RA, OSA and secondary PAH, severe AFL w FEV1 0.65L [place]  As below > PASP 56, L atrial enlargement w mild to mod MR.   ROV 06/09/13 -- COPD, RA, OSA and secondary PAH (PASP 56), severe [place] FEV[place] 0.65L [place]. On bosentan, stopped sildenifil and she didn't miss it. I asked her to see Dr BeHaroldine Lawsegarding her moderate MR - no indication for MVR (and she is a poor candidate). 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    ROV 07/16/13 -- COPD, RA, OSA and secondary PAH (PASP 56), severe [place] FEV[place] 0.[place]L [place] tr[place]er, also added Anoro to see if she would benefit. She did not benefit from the anoro.  The tracleer was tapered down in discussion with Dr BeHaroldine Lawsbut she did not tolerate > had increased SOB, decreased functional capacity and restarted. LFT normal on 11/26. She had V/q scan 11/28 >> low prob study. Some subjective wt gain, ? How much. 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 She is on O2, bosentan, CPAP. She is on metolazone and lasix, stable doses. Edema has been stable. She did not benefit from inhaled BD's. Last TTE was 5/'14. She is on 4L/min at all times. She has good compliance with CPAP.   ROV 03/30/14 -- follows for secondary PAH due to severe obstructive disease, RA, OSA. She has not noticed benefit from BD's despite her PFT.  Her V/q was low prob, her CT scan did not show ILD. She is on Tracleer, last LFT were done by Dr HaTrudie Reedn 8/'15. She is feeling stable, wearing O2 and CPAP  ROV 04/30/14 -- follow up visit for multifactorial secondary PAH. She is currently on 5L/min pulsed O2. She completed 6 minute walk 04/14/14 > [id number] on 5L/min pulsed. We've done the paperwork for macitentan, should be able to start next week. Will stop tracleer  when it is available. If she still has dyspnea then would favor a trial of inhaled prostacyclin. She is compliant w CPAP. Needs LFT today.    Review of Systems  Constitutional: Negative for fever and unexpected weight change.  HENT: Negative for congestion, dental problem, ear pain, nosebleeds, postnasal drip, rhinorrhea, sinus pressure, sneezing, sore throat and trouble swallowing.   Eyes: Negative for redness and itching.  Respiratory: Positive for shortness of breath. Negative for cough, chest tightness and wheezing.   Cardiovascular: Positive for leg swelling. Negative for palpitations.  Gastrointestinal: Negative for nausea, vomiting and diarrhea.  Genitourinary: Negative for dysuria.  Musculoskeletal: Negative for joint swelling.  Skin:  Negative for rash.  Neurological: Negative for headaches.  Hematological: Bruises/bleeds easily.  Psychiatric/Behavioral: Negative for dysphoric mood. The patient is not nervous/anxious.     Hepatic Function Latest Ref Rng 11/25/2013 10/21/2013 07/01/2013  Total Protein 6.0 - 8.3 g/dL 7.6 7.7 7.9  Albumin 3.5 - 5.2 g/dL 3.5 3.5 3.5  AST 0 - 37 U/L _0 ALT 0 - 35 U/L _1 Alk Phosphatase 39 - 117 U/L 62 70 76  Total Bilirubin 0.3 - 1.2 mg/dL 0.4 <0.2(L) 0.3  Bilirubin, Direct 0.0 - 0.3 mg/dL 0.1 - -        Objective:   Physical Exam Filed Vitals:   04/30/14 0905  BP: 116/72  Pulse: 59  Temp: 98 F (36.7 C)  TempSrc: Oral  Height: _2  (1.626 m)  Weight: 219 lb 6.4 oz (99.519 kg)  SpO2: 92%   Gen: Pleasant, well-nourished, in no distress,  normal affect  ENT: No lesions,  mouth clear,  oropharynx clear, no postnasal drip  Neck: No JVD, no TMG, no carotid bruits  Lungs: No use of accessory muscles, distant, clear without rales or rhonchi  Cardiovascular: RRR, heart sounds normal, no murmur or gallops, no peripheral edema  Musculoskeletal: No deformities, no cyanosis or clubbing, trace to 1+ pretibial edema  Neuro: alert, non focal  Skin: Warm, no lesions or rashes    Study Conclusions 12/17/12 --  - Left ventricle: The cavity size was normal. Wall thickness was normal. Systolic function was normal. The estimated ejection fraction was in the range of 55% to 60%. Wall motion was normal; there were no regional wall motion abnormalities. - Mitral valve: MR is eccentric and directed posteriorl somewhat hard to judge extent Mildly calcified annulus. Mildly thickened leaflets . Mild to moderate regurgitation. - Left atrium: The atrium was mildly dilated. - Right ventricle: The cavity size was mildly dilated. - Right atrium: The atrium was mildly dilated. - Atrial septum: The septum bowed from left to right, consistent with increased left atrial  pressure. - Pulmonary arteries: PA peak pressure: 36m Hg (S).       Assessment & Plan:  COPD (chronic obstructive pulmonary disease) She has surprisingly never benefited from BD's. Question whether this is due to the impact of her hypoxemia. I would like to try BD's again in the future after we address her hypoxemia.   OSA on CPAP Good compliance  Secondary pulmonary hypertension We are about to transition from bosentan to macitentan. Hopefully she will benefit. I suspect we will need to consider repeat R heart cath and then inhaled prostacyclin. Her case is complicated - difficult to gauge which of her problems drives her dyspnea. She needs LFT today.

## 2014-05-06 NOTE — Telephone Encounter (Signed)
April Mueller  Did you call this pt?  thanks

## 2014-05-06 NOTE — Telephone Encounter (Signed)
Pt returned call- [phone]

## 2014-05-06 NOTE — Telephone Encounter (Signed)
Pt returning call, says that someone called her a/b 20 min ago.Hillery Hunter

## 2014-05-06 NOTE — Telephone Encounter (Signed)
Per pt's chart, Mindy had called pt to discuss CMET results from last ov: Notes Recorded by Inge Rise, CMA on 05/06/2014 at 1:59 PM lmtcb x1 Notes Recorded by Collene Gobble, MD on 05/04/2014 at 9:32 PM Please inform pt that LFT are normal. Her other labwork, including her S Cr to track kidney function, is stable. RSB  Called spoke with patient and advised of lab results as stated by RB.  Pt verbalized her understanding.  She would also like RB to know that she was able to begin Opsumit yesterday 9/30.  Called [employer] and automated service verified that medication has been approved.  Will sign and forward to RB as FYI.

## 2014-05-07 NOTE — Telephone Encounter (Signed)
Thank you :)

## 2014-05-11 ENCOUNTER — Telehealth: Payer: Self-pay | Admitting: Emergency Medicine

## 2014-05-11 DIAGNOSIS — J449 Chronic obstructive pulmonary disease, unspecified: Secondary | ICD-10-CM

## 2014-05-11 NOTE — Telephone Encounter (Addendum)
Com Log copied below verbatim per Mindy's request:.   Pt seen for titration with Oximizer.  Pt on %LPM Pulse dose with OXM and SPO2 of 94-95 at rest.  While ambulating, pt fell to 82-84 after only a minute or two.  Did not feel comfortable proceeding.  OCD was firing with every breath, but pt walks extremely fast.  Asked her to slow down a bit and lets check and she said that she gets where she is going.  Do not feel that the oximizer helped with SPO2.

## 2014-05-11 NOTE — Telephone Encounter (Signed)
Will send to RB to address Titration study-- Study report placed in RB cubby to review. Sowed Sharyn Lull what this looked like and she is aware this is placed in cubby for RB to review.  Please advise RB, thanks.

## 2014-05-20 ENCOUNTER — Telehealth: Payer: Self-pay | Admitting: Emergency Medicine

## 2014-05-20 NOTE — Telephone Encounter (Signed)
Called and spoke with pt and she stated that she has been on opsumit since the end of September and since starting on this medication she has had increased SOB.  She knows that this medication can take up to 6 months to show any kind difference.  i advised the pt that we will send this to RB to get further recs.  Please advise. Thanks  No Known Allergies  Current Outpatient Prescriptions on File Prior to Visit  Medication Sig Dispense Refill  . allopurinol (ZYLOPRIM) 300 MG tablet Take 300 mg by mouth daily.      [name] ALPRAZolam (XANAX) 0.25 MG tablet Take 0.25 mg by mouth as needed for sleep.      [name] aspirin 81 MG tablet Take 81 mg by mouth daily.      [name] atorvastatin (LIPITOR) 10 MG tablet Take 1 tablet (10 mg total) by mouth daily.  90 tablet  3  . Azilsartan Medoxomil (EDARBI) 80 MG TABS Take 1 tablet (80 mg total) by mouth daily.  90 tablet  3  . bosentan (TRACLEER) 125 MG tablet Take 1 tablet (125 mg total) by mouth 2 (two) times daily.  60 tablet  5  . ferrous gluconate (FERGON) 325 MG tablet Take 325 mg by mouth daily with breakfast.      . fish oil-omega-3 fatty acids 1000 MG capsule Take 1,200 g by mouth daily.       . furosemide (LASIX) 40 MG tablet Take 40 mg by mouth daily.      . hydroxychloroquine (PLAQUENIL) 200 MG tablet Take 200 mg by mouth 2 (two) times daily.      . metolazone (ZAROXOLYN) 2.5 MG tablet Take 1 tablet (2.5 mg total) by mouth 2 (two) times a week.  8 tablet  3  . metoprolol succinate (TOPROL-XL) 100 MG 24 hr tablet Take 1 tablet (100 mg total) by mouth 2 (two) times daily. Take with or immediately following a meal.  180 tablet  3  . Potassium Chloride ER 20 MEQ TBCR Take 20 mEq by mouth daily. And as needed       No current facility-administered medications on file prior to visit.

## 2014-05-21 ENCOUNTER — Encounter (HOSPITAL_COMMUNITY): Payer: Self-pay

## 2014-05-21 ENCOUNTER — Telehealth (HOSPITAL_COMMUNITY): Payer: Self-pay | Admitting: Cardiology

## 2014-05-21 NOTE — Telephone Encounter (Signed)
Pt scheduled for RHC on 05/28/14 Cpt code- 93453 icd code-416.0/I27.0  With pts current insurance- [employer] No pre cert required

## 2014-05-21 NOTE — Telephone Encounter (Signed)
The opsumit should not make her SOB worse, although it is possible that her sx reflect progression of her disease, low oxygen, or other medications we need to adjust. Id like to review this with her at an OV to see if there is anything we need to adjust

## 2014-05-24 NOTE — Telephone Encounter (Signed)
Please help me find out if the titration was done on continuous or pulsed o2. It should be done on continuous. If they aren't using continuous then we should repeat her titration and change her. Thanks

## 2014-05-24 NOTE — Telephone Encounter (Signed)
Called and spoke with pt and she is aware of RB recs. She stated that she already has appt with RB on 10/28.   Pt stated that she is having a procedure on Friday with cardiology.

## 2014-05-25 NOTE — Telephone Encounter (Signed)
Spoke to [facility] at [place].  He said when he was walking the patient, he had her on 5L pulse.  He said that despite his request for her to slow down, she refused to slow down and walked at a very rapid pace.  She began at a resting pulse of 95%, within 2 minutes of walking she deSat to 83% on 5L pulse.  He said when they stopped, he adjusted between 3-5L to try to get her O2 back up again and it took between 3-4 min to get her O2 back to 95% at rest.

## 2014-05-26 NOTE — Telephone Encounter (Signed)
She needs to be titrated again, this time on continuous O2. I don't believe an oximizer should be used with pulsed system

## 2014-05-27 NOTE — Telephone Encounter (Signed)
Called and spoke to [facility] to inform her of a new titration study that is needed for a continuous flow of O2, not pulsed. Mandy verbalized understanding. Called and spoke to pt to inform her of the new order. Pt verbalized understanding and denied any further questions or concerns at this time.  Order placed. Nothing further needed at this time.

## 2014-05-28 ENCOUNTER — Encounter (HOSPITAL_COMMUNITY): Admission: RE | Payer: Self-pay | Source: Ambulatory Visit

## 2014-05-28 ENCOUNTER — Ambulatory Visit (HOSPITAL_COMMUNITY): Admission: RE | Admit: 2014-05-28 | Payer: Medicare Other | Source: Ambulatory Visit | Admitting: Internal Medicine

## 2014-05-28 SURGERY — RIGHT HEART CATH
Anesthesia: LOCAL

## 2014-06-02 ENCOUNTER — Ambulatory Visit (INDEPENDENT_AMBULATORY_CARE_PROVIDER_SITE_OTHER): Payer: Medicare Other | Admitting: Emergency Medicine

## 2014-06-02 ENCOUNTER — Encounter: Payer: Self-pay | Admitting: Emergency Medicine

## 2014-06-02 VITALS — BP 110/72 | HR 58 | Temp 97.0°F | Ht 64.0 in | Wt 228.4 lb

## 2014-06-02 DIAGNOSIS — I272 Other secondary pulmonary hypertension: Secondary | ICD-10-CM

## 2014-06-02 DIAGNOSIS — G4733 Obstructive sleep apnea (adult) (pediatric): Secondary | ICD-10-CM

## 2014-06-02 DIAGNOSIS — IMO0002 Reserved for concepts with insufficient information to code with codable children: Secondary | ICD-10-CM

## 2014-06-02 DIAGNOSIS — Z9989 Dependence on other enabling machines and devices: Secondary | ICD-10-CM

## 2014-06-02 DIAGNOSIS — J449 Chronic obstructive pulmonary disease, unspecified: Secondary | ICD-10-CM

## 2014-06-02 NOTE — Assessment & Plan Note (Addendum)
Continue CPAP.  

## 2014-06-02 NOTE — Assessment & Plan Note (Signed)
Recently changed her from bosentan to macitentan. She notices the difference- her breathing seems to be worse compared with last visit. She has discussed right heart catheterization with Dr Haroldine Laws. I have recommended to her that we repeat the cath in about 2 months. This would be 3 months on the new therapy. Her oxygen has been titrated to 3 L/m via  Oxymizer with exertion.

## 2014-06-02 NOTE — Progress Notes (Signed)
Subjective:    Patient ID: April Mueller, female    DOB: Jul 22, 1946, 67 y.o.   MRN: [id number]  HPI 68 yo, former smoker (28 pk-yrs), hx of HTN, OSA on CPAP, Rheumatoid arthritis on plaquenil. Hx AAA 2005 c/b ARF and 3 months of HD (now off). 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 She moved back to [place] from [place] in 8/13, is referred by Dr Melinda Crutch for [facility] that has been managed on bosentan 129m bid + sildenifil 224mtid, supplemental O2 4L/min rest, up to 5L/min exertion.   She reports a worsening in her dyspnea over the last 3 months. No CP. No wheeze or cough. She is able to exert some, able to walk when she takes her time. Her nose is congested. [place]   She has had PFT, CT scan chest, R heart cath[place]   ROV 12/11/12 -- f/u for COPD, RA, OSA and secondary PAH. 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 She is planning to see Dr HaTrudie Reedor Rheum, hasn't done so yet. She has been clinically stable. Using lasix 40 daily, increased zaroxolyn on her own to about 3 x a week. Has been doing this for a week.   ROV 01/20/13 -- COPD, RA, OSA and secondary PAH, severe [place] FEV[place].65L [place]  [place] > PASP 56, L atrial enlargement w mild to mod MR.   ROV 06/09/13 -- COPD, RA, OSA and secondary PAH (PASP 56), severe [place] On bos[place]an, stopped sildenifil and she didn't miss it. I asked her to see Dr BeHaroldine Lawsegarding her moderate MR - no indication for MVR (and she is a poor candidate). 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    ROV 07/16/13 -- COPD, RA, OSA and secondary PAH (PASP 56), [place], also added Anoro to see if she would benefit. She did not benefit from the anoro.  The tracleer was tapered down in discussion with Dr BeHaroldine Lawsbut she did not tolerate > had increased SOB, decreased functional capacity and restarted. LFT normal on 11/26. She had V/q scan 11/28 >> low prob study. Some subjective wt gain, ? How much. 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 She is on O2, bosentan, CPAP. She is on metolazone and lasix, stable doses. Edema has been stable. She did not benefit from inhaled BD's. Last TTE was 5/'14. She is on 4L/min at all times. She has good compliance with CPAP.   ROV 03/30/14 -- follows for secondary PAH due to severe obstructive disease, RA, OSA. She has not noticed benefit from BD's despite her PFT.  Her V/q was low prob, her CT scan did not show ILD. She is on Tracleer, last LFT were done by Dr HaTrudie Reedn 8/'15. She is feeling stable, wearing O2 and CPAP  ROV 04/30/14 -- follow up visit for multifactorial secondary PAH. She is currently on 5L/min pulsed O2. She completed 6 minute walk 04/14/14 > 40027fdesats on 5L/min pulsed. We've done the paperwork for macitentan, should be able to start next week. Will stop tracleer  when it is available. If she still has dyspnea then would favor a trial of inhaled prostacyclin. She is compliant w CPAP. Needs LFT today.   ROV 06/02/14 -- follow up for secondary PAH on therapy; hx of OSA, COPD, RA. We have evaluated for VTE and ILD, both negative. We have been changing from bosentan to macitentan > currently on . She was titrated to 3L/min continuous via oximizer w exertion. She is requesting a HH eval. She is having more dyspnea since stopping bosentan.  She has ben on the new medication x 1 month. She has not had a new R heart cath yet. She is not on BD's > she states they have never helped her (although she has obstructive disease by spiro).   Review of Systems  Constitutional: Negative for fever and unexpected weight change.   HENT: Negative for congestion, dental problem, ear pain, nosebleeds, postnasal drip, rhinorrhea, sinus pressure, sneezing, sore throat and trouble swallowing.   Eyes: Negative for redness and itching.  Respiratory: Positive for shortness of breath. Negative for cough, chest tightness and wheezing.   Cardiovascular: Positive for leg swelling. Negative for palpitations.  Gastrointestinal: Negative for nausea, vomiting and diarrhea.  Genitourinary: Negative for dysuria.  Musculoskeletal: Negative for joint swelling.  Skin: Negative for rash.  Neurological: Negative for headaches.  Hematological: Bruises/bleeds easily.  Psychiatric/Behavioral: Negative for dysphoric mood. The patient is not nervous/anxious.     Hepatic Function Latest Ref Rng 04/30/2014 11/25/2013 10/21/2013  Total Protein 6.0 - 8.3 g/dL 7.9 7.6 7.7  Albumin 3.5 - 5.2 g/dL 3.6 3.5 3.5  AST 0 - 37 U/L _0 ALT 0 - 35 U/L _1 Alk Phosphatase 39 - 117 U/L 68 62 70  Total Bilirubin 0.2 - 1.2 mg/dL 0.4 0.4 <0.2(L)  Bilirubin, Direct 0.0 - 0.3 mg/dL - 0.1 -        Objective:   Physical Exam Filed Vitals:   06/02/14 1421  BP: 110/72  Pulse: 58  Temp: 97 F (36.1 C)  TempSrc: Oral  Height: _2  (1.626 m)  Weight: 228 lb 6.4 oz (103.602 kg)  SpO2: 88%   Gen: Pleasant, well-nourished, in no distress,  normal affect  ENT: No lesions,  mouth clear,  oropharynx clear, no postnasal drip  Neck: No JVD, no TMG, no carotid bruits  Lungs: No use of accessory muscles, distant, clear without rales or rhonchi  Cardiovascular: RRR, heart sounds normal, no murmur or gallops, no peripheral edema  Musculoskeletal: No deformities, no cyanosis or clubbing, trace to 1+ pretibial edema  Neuro: alert, non focal  Skin: Warm, no lesions or rashes    Study Conclusions 12/17/12 --  - Left ventricle: The cavity size was normal. Wall thickness was normal. Systolic function was normal. The estimated ejection fraction was  in the range of 55% to 60%. Wall motion was normal; there were no regional wall motion abnormalities. - Mitral valve: MR is eccentric and directed posteriorl somewhat hard to judge extent Mildly calcified annulus. Mildly thickened leaflets . Mild to moderate regurgitation. - Left atrium: The atrium was mildly dilated. - Right ventricle: The cavity size was mildly dilated. - Right atrium: The atrium was mildly dilated. - Atrial septum: The septum bowed from left to right, consistent with increased left atrial pressure. - Pulmonary arteries: PA peak pressure: 21m Hg (S).       Assessment & Plan:  COPD (chronic obstructive pulmonary disease) Currently on no bronchodilators. It's unclear to me while  she has not benefited, due to severity of her hypoxemia, PAH, or shunting. I will consider restarting empiric bronchodilators again in the future although acknowledge that this might make shunting worse.   OSA on CPAP Continue CPAP  Secondary pulmonary hypertension Recently changed her from bosentan to macitentan. She notices the difference- her breathing seems to be worse compared with last visit. She has discussed right heart catheterization with Dr Haroldine Laws. I have recommended to her that we repeat the cath in about 2 months. This would be 3 months on the new therapy. Her oxygen has been titrated to 3 L/m via  Oxymizer with exertion.

## 2014-06-02 NOTE — Patient Instructions (Signed)
Please continue your current medications as you are talking them We will work on setting up a R heart catherization in about 2 months.  We will re-order your oxygen for the correct settings through your oximizer.  Follow with Dr Lamonte Sakai in 2 months or sooner if you have any problems.

## 2014-06-02 NOTE — Assessment & Plan Note (Signed)
Currently on no bronchodilators. It's unclear to me while she has not benefited, due to severity of her hypoxemia, PAH, or shunting. I will consider restarting empiric bronchodilators again in the future although acknowledge that this might make shunting worse.

## 2014-06-07 ENCOUNTER — Telehealth (HOSPITAL_COMMUNITY): Payer: Self-pay | Admitting: Vascular Surgery

## 2014-06-07 DIAGNOSIS — I5022 Chronic systolic (congestive) heart failure: Secondary | ICD-10-CM

## 2014-06-07 MED ORDER — POTASSIUM CHLORIDE ER 20 MEQ PO TBCR: 20.0000 meq | EXTENDED_RELEASE_TABLET | Freq: Every day | ORAL | Status: DC

## 2014-06-07 MED ORDER — FUROSEMIDE 40 MG PO TABS
40.0000 mg | ORAL_TABLET | Freq: Every day | ORAL | Status: DC
Start: 2014-06-07 — End: 2014-12-29

## 2014-06-07 NOTE — Telephone Encounter (Signed)
Confirmed meds requested require refills Rx's sent Pt aware

## 2014-06-07 NOTE — Telephone Encounter (Signed)
Pt need new prescription Klor-con , Furosemide 40mg  sent [facility].. And can you please contact pt when scripts are sent thanks

## 2014-07-26 ENCOUNTER — Encounter (HOSPITAL_COMMUNITY): Payer: Medicare Other

## 2014-08-04 ENCOUNTER — Ambulatory Visit (INDEPENDENT_AMBULATORY_CARE_PROVIDER_SITE_OTHER): Payer: Medicare Other | Admitting: Emergency Medicine

## 2014-08-04 ENCOUNTER — Encounter: Payer: Self-pay | Admitting: Emergency Medicine

## 2014-08-04 VITALS — BP 156/92 | HR 59 | Ht 64.0 in | Wt 229.0 lb

## 2014-08-04 DIAGNOSIS — IMO0002 Reserved for concepts with insufficient information to code with codable children: Secondary | ICD-10-CM

## 2014-08-04 DIAGNOSIS — I272 Other secondary pulmonary hypertension: Secondary | ICD-10-CM

## 2014-08-04 NOTE — Progress Notes (Signed)
Subjective:    Patient ID: April Mueller, female    DOB: Jul 22, 1946, 67 y.o.   MRN: [id number]  HPI 68 yo, former smoker (28 pk-yrs), hx of HTN, OSA on CPAP, Rheumatoid arthritis on plaquenil. Hx AAA 2005 c/b ARF and 3 months of HD (now off). 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 She moved back to [place] from [place] in 8/13, is referred by Dr Melinda Crutch for [facility] that has been managed on bosentan 129m bid + sildenifil 224mtid, supplemental O2 4L/min rest, up to 5L/min exertion.   She reports a worsening in her dyspnea over the last 3 months. No CP. No wheeze or cough. She is able to exert some, able to walk when she takes her time. Her nose is congested. [place] had [place]   She has had PFT, CT scan chest, R heart cath, [place]   ROV 12/11/12 -- f/u for COPD, RA, OSA and secondary PAH. 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 She is planning to see Dr HaTrudie Reedor Rheum, hasn't done so yet. She has been clinically stable. Using lasix 40 daily, increased zaroxolyn on her own to about 3 x a week. Has been doing this for a week.   ROV 01/20/13 -- COPD, RA, OSA and secondary PAH, severe AFL [place] FEV[place] 0.65L [place]  [place] below > PASP 56, L atrial enlargement w mild to mod MR.   ROV 06/09/13 -- COPD, RA, OSA and secondary PAH (PASP 56), severe [place] FEV[place] 0.65L [place] On bosentan, stopped sildenifil and she didn't miss it. I asked her to see Dr BeHaroldine Lawsegarding her moderate MR - no indication for MVR (and she is a poor candidate). 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    ROV 07/16/13 -- COPD, RA, OSA and secondary PAH (PASP 56), severe [place], also added Anoro to see if she would benefit. She did not benefit from the anoro.  The tracleer was tapered down in discussion with Dr BeHaroldine Lawsbut she did not tolerate > had increased SOB, decreased functional capacity and restarted. LFT normal on 11/26. She had V/q scan 11/28 >> low prob study. Some subjective wt gain, ? How much. 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 She is on O2, bosentan, CPAP. She is on metolazone and lasix, stable doses. Edema has been stable. She did not benefit from inhaled BD's. Last TTE was 5/'14. She is on 4L/min at all times. She has good compliance with CPAP.   ROV 03/30/14 -- follows for secondary PAH due to severe obstructive disease, RA, OSA. She has not noticed benefit from BD's despite her PFT.  Her V/q was low prob, her CT scan did not show ILD. She is on Tracleer, last LFT were done by Dr HaTrudie Reedn 8/'15. She is feeling stable, wearing O2 and CPAP  ROV 04/30/14 -- follow up visit for multifactorial secondary PAH. She is currently on 5L/min pulsed O2. She completed 6 minute walk 04/14/14 > 40027fdesats on 5L/min pulsed. We've done the paperwork for macitentan, should be able to start next week. Will stop tracleer  when it is available. If she still has dyspnea then would favor a trial of inhaled prostacyclin. She is compliant w CPAP. Needs LFT today.   ROV 06/02/14 -- follow up for secondary PAH on therapy; hx of OSA, COPD, RA. We have evaluated for VTE and ILD, both negative. We have been changing from bosentan to macitentan > currently on . She was titrated to 3L/min continuous via oximizer w exertion. She is requesting a HH eval. She is having more dyspnea since stopping bosentan.  She has ben on the new medication x 1 month. She has not had a new R heart cath yet. She is not on BD's > she states they have never helped her (although she has obstructive disease by spiro).   ROV 08/04/14 -- follow up visit, hx of  secondary PAH on therapy; hx of OSA, COPD, RA. She  is now on macetentan x about 3 months, she hasn't noticed any interval change, in fact she still believes that she is worse than she was on bosentan. She hasn';t had R heart cath yet. She is hypertensive today. Has gained 2 lbs.   Review of Systems  Constitutional: Negative for fever and unexpected weight change.  HENT: Negative for congestion, dental problem, ear pain, nosebleeds, postnasal drip, rhinorrhea, sinus pressure, sneezing, sore throat and trouble swallowing.   Eyes: Negative for redness and itching.  Respiratory: Positive for shortness of breath. Negative for cough, chest tightness and wheezing.   Cardiovascular: Positive for leg swelling. Negative for palpitations.  Gastrointestinal: Negative for nausea, vomiting and diarrhea.  Genitourinary: Negative for dysuria.  Musculoskeletal: Negative for joint swelling.  Skin: Negative for rash.  Neurological: Negative for headaches.  Hematological: Bruises/bleeds easily.  Psychiatric/Behavioral: Negative for dysphoric mood. The patient is not nervous/anxious.     Hepatic Function Latest Ref Rng 04/30/2014 11/25/2013 10/21/2013  Total Protein 6.0 - 8.3 g/dL 7.9 7.6 7.7  Albumin 3.5 - 5.2 g/dL 3.6 3.5 3.5  AST 0 - 37 U/L _0 ALT 0 - 35 U/L _1 Alk Phosphatase 39 - 117 U/L 68 62 70  Total Bilirubin 0.2 - 1.2 mg/dL 0.4 0.4 <0.2(L)  Bilirubin, Direct 0.0 - 0.3 mg/dL - 0.1 -        Objective:   Physical Exam Filed Vitals:   08/04/14 1350  BP: 156/92  Pulse: 59  Height: 5' 4" (1.626 m)  Weight: 229 lb (103.874 kg)  SpO2: 93%   Gen: Pleasant, well-nourished, in no distress,  normal affect  ENT: No lesions,  mouth clear,  oropharynx clear, no postnasal drip  Neck: No JVD, no TMG, no carotid bruits  Lungs: No use of accessory muscles, distant, clear without rales or rhonchi  Cardiovascular: RRR, heart sounds normal, no murmur or gallops, no peripheral edema  Musculoskeletal: No deformities, no cyanosis or clubbing,  trace to 1+ ankle edema  Neuro: alert, non focal  Skin: Warm, no lesions or rashes    Study Conclusions 12/17/12 --  - Left ventricle: The cavity size was normal. Wall thickness was normal. Systolic function was normal. The estimated ejection fraction was in the range of 55% to 60%. Wall motion was normal; there were no regional wall motion abnormalities. - Mitral valve: MR is eccentric and directed posteriorl somewhat hard to judge extent Mildly calcified annulus. Mildly thickened leaflets . Mild to moderate regurgitation. - Left atrium: The atrium was mildly dilated. - Right ventricle: The cavity size was mildly dilated. - Right atrium: The  atrium was mildly dilated. - Atrial septum: The septum bowed from left to right, consistent with increased left atrial pressure. - Pulmonary arteries: PA peak pressure: 17m Hg (S).      Assessment & Plan:  Secondary pulmonary hypertension - favor R heart cath now to assess PAPs on the macetentan. Her dyspnea is likely multifactorial and unclear to me that targeted [place]otherapy will change overall picture.  - f/u after the R heart cath to decide next steps.  - continue all other meds for now/.

## 2014-08-04 NOTE — Assessment & Plan Note (Signed)
-   favor R heart cath now to assess PAPs on the macetentan. Her dyspnea is likely multifactorial and unclear to me that targeted [place] therapy will change overall picture.  - f/u after the R heart cath to decide next steps.  - continue all other meds for now/.

## 2014-08-04 NOTE — Patient Instructions (Signed)
Please continue your meications as you are taking them  We will confirm that you will have a R heart catherization  Follow with Dr Lamonte Sakai in 1 month after the test to review and plans

## 2014-08-06 HISTORY — PX: CATARACT EXTRACTION W/ INTRAOCULAR LENS  IMPLANT, BILATERAL: SHX1307

## 2014-08-09 ENCOUNTER — Encounter (HOSPITAL_COMMUNITY): Payer: Self-pay | Admitting: *Deleted

## 2014-08-09 ENCOUNTER — Ambulatory Visit (HOSPITAL_COMMUNITY)
Admission: RE | Admit: 2014-08-09 | Discharge: 2014-08-09 | Disposition: A | Discharge status: Home / Self Care | Payer: Medicare Other | Source: Ambulatory Visit | Attending: Cardiology | Admitting: Cardiology

## 2014-08-09 VITALS — BP 124/78 | HR 73 | Wt 226.0 lb

## 2014-08-09 DIAGNOSIS — I712 Thoracic aortic aneurysm, without rupture: Secondary | ICD-10-CM | POA: Insufficient documentation

## 2014-08-09 DIAGNOSIS — I5032 Chronic diastolic (congestive) heart failure: Secondary | ICD-10-CM | POA: Diagnosis not present

## 2014-08-09 DIAGNOSIS — Z7982 Long term (current) use of aspirin: Secondary | ICD-10-CM | POA: Insufficient documentation

## 2014-08-09 DIAGNOSIS — J9610 Chronic respiratory failure, unspecified whether with hypoxia or hypercapnia: Secondary | ICD-10-CM | POA: Diagnosis present

## 2014-08-09 DIAGNOSIS — Z79899 Other long term (current) drug therapy: Secondary | ICD-10-CM | POA: Insufficient documentation

## 2014-08-09 DIAGNOSIS — Z87891 Personal history of nicotine dependence: Secondary | ICD-10-CM | POA: Diagnosis not present

## 2014-08-09 DIAGNOSIS — E669 Obesity, unspecified: Secondary | ICD-10-CM | POA: Insufficient documentation

## 2014-08-09 DIAGNOSIS — IMO0002 Reserved for concepts with insufficient information to code with codable children: Secondary | ICD-10-CM

## 2014-08-09 DIAGNOSIS — Z9989 Dependence on other enabling machines and devices: Secondary | ICD-10-CM

## 2014-08-09 DIAGNOSIS — I129 Hypertensive chronic kidney disease with stage 1 through stage 4 chronic kidney disease, or unspecified chronic kidney disease: Secondary | ICD-10-CM | POA: Insufficient documentation

## 2014-08-09 DIAGNOSIS — J449 Chronic obstructive pulmonary disease, unspecified: Secondary | ICD-10-CM | POA: Diagnosis not present

## 2014-08-09 DIAGNOSIS — J9611 Chronic respiratory failure with hypoxia: Secondary | ICD-10-CM

## 2014-08-09 DIAGNOSIS — N189 Chronic kidney disease, unspecified: Secondary | ICD-10-CM | POA: Insufficient documentation

## 2014-08-09 DIAGNOSIS — G4733 Obstructive sleep apnea (adult) (pediatric): Secondary | ICD-10-CM | POA: Diagnosis not present

## 2014-08-09 DIAGNOSIS — I272 Other secondary pulmonary hypertension: Secondary | ICD-10-CM

## 2014-08-09 LAB — CBC
HCT: 33.5 % — ABNORMAL LOW (ref 36.0–46.0)
Hemoglobin: 9.9 g/dL — ABNORMAL LOW (ref 12.0–15.0)
MCH: 29.1 pg (ref 26.0–34.0)
MCHC: 29.6 g/dL — AB (ref 30.0–36.0)
MCV: 98.5 fL (ref 78.0–100.0)
Platelets: 116 10*3/uL — ABNORMAL LOW (ref 150–400)
RBC: 3.4 MIL/uL — AB (ref 3.87–5.11)
RDW: 15.3 % (ref 11.5–15.5)
WBC: 6.2 10*3/uL (ref 4.0–10.5)

## 2014-08-09 LAB — BASIC METABOLIC PANEL
Anion gap: 6 (ref 5–15)
BUN: 31 mg/dL — ABNORMAL HIGH (ref 6–23)
CO2: 31 mmol/L (ref 19–32)
Calcium: 10.2 mg/dL (ref 8.4–10.5)
Chloride: 106 mEq/L (ref 96–112)
Creatinine, Ser: 1.45 mg/dL — ABNORMAL HIGH (ref 0.50–1.10)
GFR, EST AFRICAN AMERICAN: 42 mL/min — AB (ref >90)
GFR, EST NON AFRICAN AMERICAN: 37 mL/min — AB (ref >90)
Glucose, Bld: 121 mg/dL — ABNORMAL HIGH (ref 70–99)
Potassium: 4 mmol/L (ref 3.5–5.1)
Sodium: 143 mmol/L (ref 135–145)

## 2014-08-09 LAB — PROTIME-INR
INR: 1.11 (ref 0.00–1.49)
Prothrombin Time: 14.5 seconds (ref 11.6–15.2)

## 2014-08-09 NOTE — Patient Instructions (Signed)
You are scheduled for a Right Heart Catheterization on Wed 08/18/13, see instruction sheet  You have been referred to Pulmonary Rehab, they will contact you to schedule  We will contact you in 3 months to schedule your next appointment.

## 2014-08-09 NOTE — Addendum Note (Signed)
Encounter addended by: Scarlette Calico, RN on: 08/09/2014 11:29 AM<BR>     Documentation filed: Patient Instructions Section

## 2014-08-09 NOTE — Addendum Note (Signed)
Encounter addended by: Scarlette Calico, RN on: 08/09/2014 11:34 AM<BR>     Documentation filed: [employer], Orders

## 2014-08-09 NOTE — Progress Notes (Signed)
Patient ID: April Mueller, female   DOB: 01-12-1946, 69 y.o.   MRN: [id number]   HPI  PCP: Dr Harrington Challenger Pulmonary: Dr Lamonte Sakai   [name] is a 69 yo with following PMHx  1) COPD, severe       --former smoker (28 pk-yrs)       --on home O2 - 4L/min rest, up to 5L/min exertion.        --spirometry 6/13 in [place] = 0.85L FVC = 1.3L no DLCO measured. FEV1 in 2014 = 0.73       --CT chest 10/14 - moderate centrilobular emphysema. No pulm fibrosis. Stable Asc Ao repair       --PFTs (11/14) FVC 46%, FEV1 0.8L (40%), ratio 87%, FEF 25-75% 0.44L DLCO 22%, TLC 50% => severe combined restriction/obstruction.  2) Obesity 3) RA       --on plaquenil 4) Ascending aortic aneurysm s/p repair 2005     --c/b renal failure requiring HD x 3 months 5) PAH - previously treated in [place].  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      --on macitentan (revatio stopped 10/14).     --V/Q scan 11/14 no chronic PE 6) OSA 7) Diastolic CHF    --ECHO 4/03 EF 55-60% RV mildly dilated with normal function. Mild to moderate posterior MR. RVSP 56. Probable restrictive diastolic filling pattern    --ECHO 9/15 EF 60-65% RV mildly dilated with normal function. Mild to moderate posterior MR. RVSP 40. AoRoot ok 8) CKD  Moved back from [place] and saw Dr. Lamonte Sakai. In [place] was followed by cardiology and pulmonary. Started on Tracleer in 2006. Revatio was added, however was discontinued without any change in her symptoms. Has not had a RHC in several years. Stopped smoking in January 2005 when she had aortic aneurysm repair. Smoked 1.5 ppd x 15 - 20 years.   Follow up: About 3 months ago Dr. Lamonte Sakai saw her and changed from Tracleer to [place]. Has not noticed any improvement. In fact, has gotten worse. Now cannot do any activity without dyspnea or desaturating. Says sats go into 70s on 5L. No significant edema. No cough on syncope. Weight up just a few pounds.  Compliant with CPAP and medications. Following a low salt diet. On chronic 5L home oxygen. Saw Dr.  Lamonte Sakai on 12/30 who felt dyspnea was multifactorial and recommended RHC.   Labs 02/05/13 K 3.8 Creatinine 1.37 05/08/13 K 3.8, Creatinine 1.58 11/14 K 4.1, creatinine 1.82   Past Medical History  Diagnosis Date  . Hypertension   . Pulmonary HTN   . Aortic aneurysm   . Kidney disorder   . Sleep apnea     wears CPAP   Current Outpatient Prescriptions on File Prior to Encounter  Medication Sig Dispense Refill  . allopurinol (ZYLOPRIM) 300 MG tablet Take 300 mg by mouth daily.    [name] ALPRAZolam (XANAX) 0.25 MG tablet Take 0.25 mg by mouth as needed for sleep.    [name] aspirin 81 MG tablet Take 81 mg by mouth daily.    [name] atorvastatin (LIPITOR) 10 MG tablet Take 1 tablet (10 mg total) by mouth daily. 90 tablet 3  . Azilsartan Medoxomil (EDARBI) 80 MG TABS Take 80 mg by mouth 2 (two) times daily.    . ferrous gluconate (FERGON) 325 MG tablet Take 325 mg by mouth daily with breakfast.    . furosemide (LASIX) 40 MG tablet Take 1 tablet (40 mg total) by mouth daily. 90 tablet 3  . hydroxychloroquine (PLAQUENIL) 200  MG tablet Take 200 mg by mouth 2 (two) times daily.    . Macitentan (OPSUMIT) 10 MG TABS Take 10 mg by mouth daily.    . metolazone (ZAROXOLYN) 2.5 MG tablet Take 1 tablet (2.5 mg total) by mouth 2 (two) times a week. 8 tablet 3  . metoprolol succinate (TOPROL-XL) 100 MG 24 hr tablet Take 100 mg by mouth daily. Take with or immediately following a meal.    . Omega-3 Fatty Acids (FISH OIL) 1200 MG CAPS Take 1,200 mg by mouth daily.    . Potassium Chloride ER 20 MEQ TBCR Take 20 mEq by mouth daily. 90 tablet 3   No current facility-administered medications on file prior to encounter.   History   Social History  . Marital Status: Married    Spouse Name: N/A    Number of Children: 1  . Years of Education: N/A   Occupational History  . Disability     Office Work   Social History Main Topics  . Smoking status: Former Smoker -- 2.00 packs/day for 14 years    Types: Cigarettes     Quit date: 08/09/2003  . Smokeless tobacco: Never Used  . Alcohol Use: No  . Drug Use: No  . Sexual Activity: Not on file   Other Topics Concern  . Not on file   Social History Narrative   Family History  Problem Relation Age of Onset  . Heart attack Mother   . Cancer Father     prostate  . Diabetes Brother      BMET    Component Value Date/Time   NA 137 04/30/2014 0954   K 3.7 04/30/2014 0954   CL 103 04/30/2014 0954   CO2 28 04/30/2014 0954   GLUCOSE 112* 04/30/2014 0954   BUN 34* 04/30/2014 0954   CREATININE 1.6* 04/30/2014 0954   CALCIUM 10.3 04/30/2014 0954   GFRNONAA 33* 10/21/2013 0953   GFRAA 38* 10/21/2013 0953   Hepatic Function Panel     Component Value Date/Time   PROT 7.9 04/30/2014 0954   ALBUMIN 3.6 04/30/2014 0954   AST 18 04/30/2014 0954   ALT 12 04/30/2014 0954   ALKPHOS 68 04/30/2014 0954   BILITOT 0.4 04/30/2014 0954   BILIDIR 0.1 11/25/2013 1115    Filed Vitals:   08/09/14 1002  BP: 124/78  Pulse: 73  Weight: 226 lb (102.513 kg)  SpO2: 91%   Physical Exam Gen: Pleasant, well-nourished, in no distress, normal affect; on 5L O2. SOB on any exertion. HEENT: normal Neck: CVP 7-8 Carotids 2+ with bilateral radiated bruits Lungs: Decreased BS throughout. No use of accessory muscles, distant, clear without rales or rhonchi Cardiovascular: well healed sternotomy scar  PMI normal. RRR, heart sounds normal, 2/6 SEM RUSB radiated to carotids Extremities: No deformities, no cyanosis or clubbing, trace bilateral edema  Neuro: alert, non focal. Cranial nerves intact. Moves all 4 extremities without difficulty Skin: Warm, no lesions or rashes  1. 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     --In looking at her PFTs and echo, I think major issue is severe parenchymal lung disease and not PAH. She is now having marked desaturations on 5L  [place]. Given h/o PAH and diastolic HF, I do think it is reasonable to repeat RHC to make sure we are not underestimating pulmonary pressures. We talked about possibility of need to consider lung transplantation if  she is candidate. Also talked about role or pulmonary rehab and weight loss.  2. OSA: Continue CPAP nightly.  3. Chronic diastolic CHF: Volume status stable. Continue current diuretic regimen. Proceed with RHC. 4. CKD: Creatinine was higher in 11/14 than in the past.  5. COPD: Severe, on home oxygen.  Followed by Dr. Lamonte Sakai. Will refer for pulmonary rehab 6. Aortic root aneurysm s/p repair 2005 - stable by recent echo  Risks and indications of RHC discussed and questions answered.    Tanayia Wahlquist,MD 10:48 AM

## 2014-08-11 ENCOUNTER — Other Ambulatory Visit: Payer: Self-pay | Admitting: *Deleted

## 2014-08-12 ENCOUNTER — Telehealth (HOSPITAL_COMMUNITY): Payer: Self-pay | Admitting: Cardiology

## 2014-08-12 NOTE — Telephone Encounter (Signed)
Pt scheduled for RHC on 08/18/2014 with Dr.Bensimhon Cpt code- 93456 icd 10- I27.1  With pts current insurance- [employer] [place]/ Medicare  No pre cert required

## 2014-08-18 ENCOUNTER — Encounter (HOSPITAL_COMMUNITY)
Admission: RE | Disposition: A | Discharge status: Home / Self Care | Payer: Self-pay | Source: Ambulatory Visit | Attending: Internal Medicine

## 2014-08-18 ENCOUNTER — Ambulatory Visit (HOSPITAL_COMMUNITY)
Admission: RE | Admit: 2014-08-18 | Discharge: 2014-08-18 | Disposition: A | Discharge status: Home / Self Care | Payer: Medicare Other | Source: Ambulatory Visit | Attending: Internal Medicine | Admitting: Internal Medicine

## 2014-08-18 ENCOUNTER — Encounter (HOSPITAL_COMMUNITY): Payer: Self-pay | Admitting: Internal Medicine

## 2014-08-18 DIAGNOSIS — Z7982 Long term (current) use of aspirin: Secondary | ICD-10-CM | POA: Diagnosis not present

## 2014-08-18 DIAGNOSIS — I272 Other secondary pulmonary hypertension: Secondary | ICD-10-CM | POA: Diagnosis not present

## 2014-08-18 DIAGNOSIS — Z8249 Family history of ischemic heart disease and other diseases of the circulatory system: Secondary | ICD-10-CM | POA: Diagnosis not present

## 2014-08-18 DIAGNOSIS — Z87891 Personal history of nicotine dependence: Secondary | ICD-10-CM | POA: Insufficient documentation

## 2014-08-18 DIAGNOSIS — J9610 Chronic respiratory failure, unspecified whether with hypoxia or hypercapnia: Secondary | ICD-10-CM | POA: Insufficient documentation

## 2014-08-18 DIAGNOSIS — R0600 Dyspnea, unspecified: Secondary | ICD-10-CM | POA: Diagnosis present

## 2014-08-18 DIAGNOSIS — Z79899 Other long term (current) drug therapy: Secondary | ICD-10-CM | POA: Diagnosis not present

## 2014-08-18 DIAGNOSIS — I5032 Chronic diastolic (congestive) heart failure: Secondary | ICD-10-CM | POA: Diagnosis not present

## 2014-08-18 DIAGNOSIS — Z9981 Dependence on supplemental oxygen: Secondary | ICD-10-CM | POA: Diagnosis not present

## 2014-08-18 DIAGNOSIS — N189 Chronic kidney disease, unspecified: Secondary | ICD-10-CM | POA: Diagnosis not present

## 2014-08-18 DIAGNOSIS — I2721 Secondary pulmonary arterial hypertension: Secondary | ICD-10-CM | POA: Diagnosis present

## 2014-08-18 DIAGNOSIS — G4733 Obstructive sleep apnea (adult) (pediatric): Secondary | ICD-10-CM | POA: Diagnosis not present

## 2014-08-18 DIAGNOSIS — E669 Obesity, unspecified: Secondary | ICD-10-CM | POA: Diagnosis not present

## 2014-08-18 DIAGNOSIS — I129 Hypertensive chronic kidney disease with stage 1 through stage 4 chronic kidney disease, or unspecified chronic kidney disease: Secondary | ICD-10-CM | POA: Insufficient documentation

## 2014-08-18 DIAGNOSIS — M069 Rheumatoid arthritis, unspecified: Secondary | ICD-10-CM | POA: Insufficient documentation

## 2014-08-18 DIAGNOSIS — J449 Chronic obstructive pulmonary disease, unspecified: Secondary | ICD-10-CM | POA: Diagnosis not present

## 2014-08-18 DIAGNOSIS — I270 Primary pulmonary hypertension: Secondary | ICD-10-CM

## 2014-08-18 HISTORY — PX: RIGHT HEART CATHETERIZATION: SHX5447

## 2014-08-18 LAB — POCT I-STAT 3, ART BLOOD GAS (G3+)
Acid-base deficit: 6 mmol/L — ABNORMAL HIGH (ref 0.0–2.0)
Bicarbonate: 20.8 mEq/L (ref 20.0–24.0)
O2 SAT: 91 %
TCO2: 22 mmol/L (ref 0–100)
pCO2 arterial: 43.8 mmHg (ref 35.0–45.0)
pH, Arterial: 7.285 — ABNORMAL LOW (ref 7.350–7.450)
pO2, Arterial: 67 mmHg — ABNORMAL LOW (ref 80.0–100.0)

## 2014-08-18 LAB — POCT I-STAT 3, VENOUS BLOOD GAS (G3P V)
ACID-BASE DEFICIT: 1 mmol/L (ref 0.0–2.0)
Acid-base deficit: 1 mmol/L (ref 0.0–2.0)
BICARBONATE: 24.5 meq/L — AB (ref 20.0–24.0)
BICARBONATE: 24.6 meq/L — AB (ref 20.0–24.0)
O2 SAT: 57 %
O2 Saturation: 62 %
PCO2 VEN: 44.3 mmHg — AB (ref 45.0–50.0)
PO2 VEN: 31 mmHg (ref 30.0–45.0)
TCO2: 26 mmol/L (ref 0–100)
TCO2: 26 mmol/L (ref 0–100)
pCO2, Ven: 44.3 mmHg — ABNORMAL LOW (ref 45.0–50.0)
pH, Ven: 7.351 — ABNORMAL HIGH (ref 7.250–7.300)
pH, Ven: 7.352 — ABNORMAL HIGH (ref 7.250–7.300)
pO2, Ven: 34 mmHg (ref 30.0–45.0)

## 2014-08-18 SURGERY — RIGHT HEART CATH
Anesthesia: LOCAL

## 2014-08-18 MED ORDER — ASPIRIN 81 MG PO CHEW: 81.0000 mg | CHEWABLE_TABLET | ORAL | Status: DC

## 2014-08-18 MED ORDER — SODIUM CHLORIDE 0.9 % IV SOLN: 250.0000 mL | INTRAVENOUS | Status: DC | PRN

## 2014-08-18 MED ORDER — SODIUM CHLORIDE 0.9 % IJ SOLN: 3.0000 mL | Freq: Two times a day (BID) | INTRAMUSCULAR | Status: DC

## 2014-08-18 MED ORDER — LIDOCAINE HCL (PF) 1 % IJ SOLN
INTRAMUSCULAR | Status: AC
Start: 2014-08-18 — End: 2014-08-18
  Filled 2014-08-18: qty 60

## 2014-08-18 MED ORDER — ACETAMINOPHEN 325 MG PO TABS
650.0000 mg | ORAL_TABLET | ORAL | Status: DC | PRN
Start: 2014-08-18 — End: 2014-08-18

## 2014-08-18 MED ORDER — SODIUM CHLORIDE 0.9 % IV SOLN
INTRAVENOUS | Status: DC
  Administered 2014-08-18: 09:00:00 via INTRAVENOUS

## 2014-08-18 MED ORDER — ONDANSETRON HCL 4 MG/2ML IJ SOLN: 4.0000 mg | Freq: Four times a day (QID) | INTRAMUSCULAR | Status: DC | PRN

## 2014-08-18 MED ORDER — SODIUM CHLORIDE 0.9 % IJ SOLN: 3.0000 mL | INTRAMUSCULAR | Status: DC | PRN

## 2014-08-18 MED ORDER — HEPARIN (PORCINE) IN NACL 2-0.9 UNIT/ML-% IJ SOLN
INTRAMUSCULAR | Status: AC
  Filled 2014-08-18: qty 1000

## 2014-08-18 NOTE — Interval H&P Note (Signed)
History and Physical Interval Note:  08/18/2014 10:31 AM  April Mueller  has presented today for surgery, with the diagnosis of hp  The various methods of treatment have been discussed with the patient and family. After consideration of risks, benefits and other options for treatment, the patient has consented to  Procedure(s): RIGHT HEART CATH (N/A) as a surgical intervention .  The patient's history has been reviewed, patient examined, no change in status, stable for surgery.  I have reviewed the patient's chart and labs.  Questions were answered to the patient's satisfaction.     [name]

## 2014-08-18 NOTE — CV Procedure (Signed)
Cardiac Cath Procedure Note:  Indication:  Dyspnea, PAH  Procedures performed:  1) Right heart catheterization  Description of procedure:   The risks and indication of the procedure were explained. Consent was signed and placed on the chart. An appropriate timeout was taken prior to the procedure. The right AC area was prepped and draped in the routine sterile fashion and anesthetized with 1% local lidocaine. We then attempted to change the pre-existing right AC PIV out over a wire but we were unable to feed the wire. The right neck was prepped and draped and we attempted to access the RIJ under ultrasound. The vessel was small and although we cannulated were unable to get good blood return. We then switched to the R groin and did the RHC through the RFV using a 7FR sheath and a standard Swan-Ganz catheter. We had difficulty wedging the catheter an an 0.25 wire was used for assistance.   Complications: None apparent.  Findings:  RA =  14 RV = 62/3/15 PA = 63/21 (38) PCW = mean 23 v waves to 40 Fick cardiac output/index = 6.3/3.2 Thermodilution CO/CI = 6.1/3.1 PVR = 2.0 WU FA sat = 91% PA sat = 57%, 62%  Assessment: 1. Mild to moderate pulmonary HTN with normal PVR which suggests pulmonary venous HTN 2. Moderately elevated left-sided pressures in the setting of her not taking lasix this am 3. Normal cardiac output  Plan/Discussion:  She has mild to moderate PH which is mostly pulmonary venous HTN. PVR is normal. Nor role for selective pulmonary vasodilators. Will need to manage volume status closely and continue home O2 for severe COPD.    Glori Bickers MD 10:32 AM

## 2014-08-18 NOTE — Discharge Instructions (Signed)
Venogram, Care After °Refer to this sheet in the next few weeks. These instructions provide you with information on caring for yourself after your procedure. Your health care provider may also give you more specific instructions. Your treatment has been planned according to current medical practices, but problems sometimes occur. Call your health care provider if you have any problems or questions after your procedure. °WHAT TO EXPECT AFTER THE PROCEDURE °After your procedure, it is typical to have the following sensations: °· Mild discomfort at the catheter insertion site. °HOME CARE INSTRUCTIONS  °· Take all medicines exactly as directed. °· Follow any prescribed diet. °· Follow instructions regarding both rest and physical activity. °· Drink more fluids for the first several days after the procedure in order to help flush dye from your kidneys. °SEEK MEDICAL CARE IF: °· You develop a rash. °· You have fever not controlled by medicine. °SEEK IMMEDIATE MEDICAL CARE IF: °· There is pain, drainage, bleeding, redness, swelling, warmth or a red streak at the site of the IV tube. °· The extremity where your IV tube was placed becomes discolored, numb, or cool. °· You have difficulty breathing or shortness of breath. °· You develop chest pain. °· You have excessive dizziness or fainting. °Document Released: 05/13/2013 Document Revised: 07/28/2013 Document Reviewed: 05/13/2013 °ExitCare® Patient Information ©2015 [employer]. This information is not intended to replace advice given to you by your health care provider. Make sure you discuss any questions you have with your health care provider. ° °

## 2014-08-18 NOTE — H&P (View-Only) (Signed)
Patient ID: April Mueller, female   DOB: 10-10-1945, 69 y.o.   MRN: [id number]   HPI  PCP: Dr Harrington Challenger Pulmonary: Dr Lamonte Sakai   April Mueller is a 69 yo with following PMHx  1) COPD, severe       --former smoker (28 pk-yrs)       --on home O2 - 4L/min rest, up to 5L/min exertion.        --spirometry 6/13 in [place] = 0.85L FVC = 1.3L no DLCO measured. FEV1 in 2014 = 0.73       --CT chest 10/14 - moderate centrilobular emphysema. No pulm fibrosis. Stable Asc Ao repair       --PFTs (11/14) FVC 46%, FEV1 0.8L (40%), ratio 87%, FEF 25-75% 0.44L DLCO 22%, TLC 50% => severe combined restriction/obstruction.  2) Obesity 3) RA       --on plaquenil 4) Ascending aortic aneurysm s/p repair 2005     --c/b renal failure requiring HD x 3 months 5) PAH - previously treated in [place].  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      --on macitentan (revatio stopped 10/14).     --V/Q scan 11/14 no chronic PE 6) OSA 7) Diastolic CHF    --ECHO 9/56 EF 55-60% RV mildly dilated with normal function. Mild to moderate posterior MR. RVSP 56. Probable restrictive diastolic filling pattern    --ECHO 9/15 EF 60-65% RV mildly dilated with normal function. Mild to moderate posterior MR. RVSP 40. AoRoot ok 8) CKD  Moved back from [place] and saw Dr. Lamonte Sakai. In [place] was followed by cardiology and pulmonary. Started on Tracleer in 2006. Revatio was added, however was discontinued without any change in her symptoms. Has not had a RHC in several years. Stopped smoking in January 2005 when she had aortic aneurysm repair. Smoked 1.5 ppd x 15 - 20 years.   Follow up: About 3 months ago Dr. Lamonte Sakai saw her and changed from Tracleer to [place]. Has not noticed any improvement. In fact, has gotten worse. Now cannot do any activity without dyspnea or desaturating. Says sats go into 70s on 5L. No significant edema. No cough on syncope. Weight up just a few pounds.  Compliant with CPAP and medications. Following a low salt diet. On chronic 5L home oxygen. Saw Dr.  Lamonte Sakai on 12/30 who felt dyspnea was multifactorial and recommended RHC.   Labs 02/05/13 K 3.8 Creatinine 1.37 05/08/13 K 3.8, Creatinine 1.58 11/14 K 4.1, creatinine 1.82   Past Medical History  Diagnosis Date  . Hypertension   . Pulmonary HTN   . Aortic aneurysm   . Kidney disorder   . Sleep apnea     wears CPAP   Current Outpatient Prescriptions on File Prior to Encounter  Medication Sig Dispense Refill  . allopurinol (ZYLOPRIM) 300 MG tablet Take 300 mg by mouth daily.    [name] ALPRAZolam (XANAX) 0.25 MG tablet Take 0.25 mg by mouth as needed for sleep.    [name] aspirin 81 MG tablet Take 81 mg by mouth daily.    [name] atorvastatin (LIPITOR) 10 MG tablet Take 1 tablet (10 mg total) by mouth daily. 90 tablet 3  . Azilsartan Medoxomil (EDARBI) 80 MG TABS Take 80 mg by mouth 2 (two) times daily.    . ferrous gluconate (FERGON) 325 MG tablet Take 325 mg by mouth daily with breakfast.    . furosemide (LASIX) 40 MG tablet Take 1 tablet (40 mg total) by mouth daily. 90 tablet 3  . hydroxychloroquine (PLAQUENIL) 200  MG tablet Take 200 mg by mouth 2 (two) times daily.    . Macitentan (OPSUMIT) 10 MG TABS Take 10 mg by mouth daily.    . metolazone (ZAROXOLYN) 2.5 MG tablet Take 1 tablet (2.5 mg total) by mouth 2 (two) times a week. 8 tablet 3  . metoprolol succinate (TOPROL-XL) 100 MG 24 hr tablet Take 100 mg by mouth daily. Take with or immediately following a meal.    . Omega-3 Fatty Acids (FISH OIL) 1200 MG CAPS Take 1,200 mg by mouth daily.    . Potassium Chloride ER 20 MEQ TBCR Take 20 mEq by mouth daily. 90 tablet 3   No current facility-administered medications on file prior to encounter.   History   Social History  . Marital Status: Married    Spouse Name: N/A    Number of Children: 1  . Years of Education: N/A   Occupational History  . Disability     Office Work   Social History Main Topics  . Smoking status: Former Smoker -- 2.00 packs/day for 14 years    Types: Cigarettes     Quit date: 08/09/2003  . Smokeless tobacco: Never Used  . Alcohol Use: No  . Drug Use: No  . Sexual Activity: Not on file   Other Topics Concern  . Not on file   Social History Narrative   Family History  Problem Relation Age of Onset  . Heart attack Mother   . Cancer Father     prostate  . Diabetes Brother      BMET    Component Value Date/Time   NA 137 04/30/2014 0954   K 3.7 04/30/2014 0954   CL 103 04/30/2014 0954   CO2 28 04/30/2014 0954   GLUCOSE 112* 04/30/2014 0954   BUN 34* 04/30/2014 0954   CREATININE 1.6* 04/30/2014 0954   CALCIUM 10.3 04/30/2014 0954   GFRNONAA 33* 10/21/2013 0953   GFRAA 38* 10/21/2013 0953   Hepatic Function Panel     Component Value Date/Time   PROT 7.9 04/30/2014 0954   ALBUMIN 3.6 04/30/2014 0954   AST 18 04/30/2014 0954   ALT 12 04/30/2014 0954   ALKPHOS 68 04/30/2014 0954   BILITOT 0.4 04/30/2014 0954   BILIDIR 0.1 11/25/2013 1115    Filed Vitals:   08/09/14 1002  BP: 124/78  Pulse: 73  Weight: 226 lb (102.513 kg)  SpO2: 91%   Physical Exam Gen: Pleasant, well-nourished, in no distress, normal affect; on 5L O2. SOB on any exertion. HEENT: normal Neck: CVP 7-8 Carotids 2+ with bilateral radiated bruits Lungs: Decreased BS throughout. No use of accessory muscles, distant, clear without rales or rhonchi Cardiovascular: well healed sternotomy scar  PMI normal. RRR, heart sounds normal, 2/6 SEM RUSB radiated to carotids Extremities: No deformities, no cyanosis or clubbing, trace bilateral edema  Neuro: alert, non focal. Cranial nerves intact. Moves all 4 extremities without difficulty Skin: Warm, no lesions or rashes  1. 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     --In looking at her PFTs and echo, I think major issue is severe parenchymal lung disease and not PAH. She is now having marked desaturations on 5L  [place]. Given h/o PAH and diastolic HF, I do think it is reasonable to repeat RHC to make sure we are not underestimating pulmonary pressures. We talked about possibility of need to consider lung transplantation if  she is candidate. Also talked about role or pulmonary rehab and weight loss.  2. OSA: Continue CPAP nightly.  3. Chronic diastolic CHF: Volume status stable. Continue current diuretic regimen. Proceed with RHC. 4. CKD: Creatinine was higher in 11/14 than in the past.  5. COPD: Severe, on home oxygen.  Followed by Dr. Lamonte Sakai. Will refer for pulmonary rehab 6. Aortic root aneurysm s/p repair 2005 - stable by recent echo  Risks and indications of RHC discussed and questions answered.    Lydia Meng,MD 10:48 AM

## 2014-08-19 ENCOUNTER — Telehealth (HOSPITAL_COMMUNITY): Payer: Self-pay

## 2014-08-19 NOTE — Telephone Encounter (Signed)
Called patient regarding entrance to Pulmonary Rehab.  Patient states that they are interested in attending the program.  Dallana is going to verify insurance coverage and follow up.

## 2014-08-26 ENCOUNTER — Telehealth (HOSPITAL_COMMUNITY): Payer: Self-pay

## 2014-08-26 NOTE — Telephone Encounter (Signed)
Patient called insurance company to verify coverage.  Patient stated she has a small copay but needs insurance authorization.  I have sent insurance authorization over to Pulmonary Doctor.

## 2014-09-07 ENCOUNTER — Telehealth (HOSPITAL_COMMUNITY): Payer: Self-pay | Admitting: Vascular Surgery

## 2014-09-07 ENCOUNTER — Telehealth (HOSPITAL_COMMUNITY): Payer: Self-pay | Admitting: *Deleted

## 2014-09-07 NOTE — Telephone Encounter (Signed)
Need pre auth for Cardiac rehab

## 2014-09-08 ENCOUNTER — Ambulatory Visit (HOSPITAL_COMMUNITY)
Admission: RE | Admit: 2014-09-08 | Discharge: 2014-09-08 | Disposition: A | Discharge status: Home / Self Care | Payer: Medicare Other | Source: Ambulatory Visit | Attending: Internal Medicine | Admitting: Internal Medicine

## 2014-09-08 ENCOUNTER — Encounter (HOSPITAL_COMMUNITY): Payer: Self-pay

## 2014-09-08 VITALS — BP 107/65 | HR 59 | Resp 20 | Wt 226.5 lb

## 2014-09-08 DIAGNOSIS — J9611 Chronic respiratory failure with hypoxia: Secondary | ICD-10-CM

## 2014-09-08 DIAGNOSIS — E669 Obesity, unspecified: Secondary | ICD-10-CM | POA: Insufficient documentation

## 2014-09-08 DIAGNOSIS — J449 Chronic obstructive pulmonary disease, unspecified: Secondary | ICD-10-CM | POA: Diagnosis not present

## 2014-09-08 DIAGNOSIS — Z87891 Personal history of nicotine dependence: Secondary | ICD-10-CM | POA: Diagnosis not present

## 2014-09-08 DIAGNOSIS — Z79899 Other long term (current) drug therapy: Secondary | ICD-10-CM | POA: Insufficient documentation

## 2014-09-08 DIAGNOSIS — J9610 Chronic respiratory failure, unspecified whether with hypoxia or hypercapnia: Secondary | ICD-10-CM | POA: Diagnosis not present

## 2014-09-08 DIAGNOSIS — Z7982 Long term (current) use of aspirin: Secondary | ICD-10-CM | POA: Diagnosis not present

## 2014-09-08 DIAGNOSIS — I712 Thoracic aortic aneurysm, without rupture: Secondary | ICD-10-CM | POA: Diagnosis not present

## 2014-09-08 DIAGNOSIS — R0600 Dyspnea, unspecified: Secondary | ICD-10-CM | POA: Diagnosis not present

## 2014-09-08 DIAGNOSIS — I5032 Chronic diastolic (congestive) heart failure: Secondary | ICD-10-CM

## 2014-09-08 DIAGNOSIS — I129 Hypertensive chronic kidney disease with stage 1 through stage 4 chronic kidney disease, or unspecified chronic kidney disease: Secondary | ICD-10-CM | POA: Diagnosis not present

## 2014-09-08 DIAGNOSIS — Z9989 Dependence on other enabling machines and devices: Secondary | ICD-10-CM

## 2014-09-08 DIAGNOSIS — G4733 Obstructive sleep apnea (adult) (pediatric): Secondary | ICD-10-CM

## 2014-09-08 DIAGNOSIS — N189 Chronic kidney disease, unspecified: Secondary | ICD-10-CM | POA: Insufficient documentation

## 2014-09-08 NOTE — Progress Notes (Signed)
Patient ID: April Mueller, female   DOB: 1946/02/01, 69 y.o.   MRN: [id number]   HPI  PCP: Dr Harrington Challenger Pulmonary: Dr Lamonte Sakai   April Mueller is a 69 yo with following PMHx  1) COPD, severe       --former smoker (28 pk-yrs)       --on home O2 - 4L/min rest, up to 5L/min exertion.        --spirometry 6/13 in [place] = 0.85L FVC = 1.3L no DLCO measured. FEV1 in 2014 = 0.73       --CT chest 10/14 - moderate centrilobular emphysema. No pulm fibrosis. Stable Asc Ao repair       --PFTs (11/14) FVC 46%, FEV1 0.8L (40%), ratio 87%, FEF 25-75% 0.44L DLCO 22%, TLC 50% => severe combined restriction/obstruction.  2) Obesity 3) RA       --on plaquenil 4) Ascending aortic aneurysm s/p repair 2005     --c/b renal failure requiring HD x 3 months 5) PAH - previously treated in [place].  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      --on macitentan (revatio stopped 10/14).     --V/Q scan 11/14 no chronic PE - 2015 traclear stopped and started on macitentan 04/2015.  6) OSA 7) Diastolic CHF    --ECHO 1/91 EF 55-60% RV mildly dilated with normal function. Mild to moderate posterior MR. RVSP 56. Probable restrictive diastolic filling pattern    --ECHO 9/15 EF 60-65% RV mildly dilated with normal function. Mild to moderate posterior MR. RVSP 40. AoRoot ok 8) CKD 9) RHC 08/18/2014 No role for pulmonary vasodilators.  RA = 14 RV = 62/3/15 PA = 63/21 (38) PCW = mean 23 v waves to 40 Fick cardiac output/index = 6.3/3.2 Thermodilution CO/CI = 6.1/3.1PVR = 2.0 WU FA sat = 91% PA sat = 57%, 62%   Moved back from [place] and saw Dr. Lamonte Sakai. In [place] was followed by cardiology and pulmonary. Started on Tracleer in 2006. Revatio was added, however was discontinued without any change in her symptoms. Has not had a RHC in several years. Stopped smoking in January 2005 when she had aortic aneurysm repair. Smoked 1.5 ppd x 15 - 20 years.   Follow up: Overall feeling ok. Remains SOB with exertion but this her baseline. Denies PND/Orthopnea. Has not started  pulmonary rehab. She continues on 5 liters [place]. Weight at home 220-225 pounds. Limiting fluid intake to < 2 liters per day. Following low salt diet. Taking all medications. Ambulates with a rolling walker.      Labs 02/05/13 K 3.8 Creatinine 1.37 05/08/13 K 3.8, Creatinine 1.58 11/14 K 4.1, creatinine 1.82 08/09/2014: K 4.0 Creatinine 1.45    Past Medical History  Diagnosis Date  . Hypertension   . Pulmonary HTN   . Aortic aneurysm   . Kidney disorder   . Sleep apnea     wears CPAP   Current Outpatient Prescriptions on File Prior to Encounter  Medication Sig Dispense Refill  . allopurinol (ZYLOPRIM) 300 MG tablet Take 300 mg by mouth daily.    [name] ALPRAZolam (XANAX) 0.25 MG tablet Take 0.25 mg by mouth as needed for sleep.    [name] aspirin 81 MG tablet Take 81 mg by mouth daily.    [name] atorvastatin (LIPITOR) 10 MG tablet Take 1 tablet (10 mg total) by mouth daily. 90 tablet 3  . Azilsartan Medoxomil (EDARBI) 80 MG TABS Take 80 mg by mouth daily.     . ferrous gluconate (FERGON) 325 MG tablet  Take 325 mg by mouth daily with breakfast.    . furosemide (LASIX) 40 MG tablet Take 1 tablet (40 mg total) by mouth daily. 90 tablet 3  . hydroxychloroquine (PLAQUENIL) 200 MG tablet Take 200 mg by mouth 2 (two) times daily.    . Macitentan (OPSUMIT) 10 MG TABS Take 10 mg by mouth daily.    . metolazone (ZAROXOLYN) 2.5 MG tablet Take 1 tablet (2.5 mg total) by mouth 2 (two) times a week. 8 tablet 3  . metoprolol succinate (TOPROL-XL) 100 MG 24 hr tablet Take 100 mg by mouth daily. Take with or immediately following a meal.    . Omega-3 Fatty Acids (FISH OIL) 1200 MG CAPS Take 1,200 mg by mouth daily.    . Potassium Chloride ER 20 MEQ TBCR Take 20 mEq by mouth daily. 90 tablet 3   No current facility-administered medications on file prior to encounter.   History   Social History  . Marital Status: Married    Spouse Name: N/A    Number of Children: 1  . Years of Education: N/A   Occupational History   . Disability     Office Work   Social History Main Topics  . Smoking status: Former Smoker -- 2.00 packs/day for 14 years    Types: Cigarettes    Quit date: 08/09/2003  . Smokeless tobacco: Never Used  . Alcohol Use: No  . Drug Use: No  . Sexual Activity: None   Other Topics Concern  . None   Social History Narrative   Family History  Problem Relation Age of Onset  . Heart attack Mother   . Cancer Father     prostate  . Diabetes Brother      BMET    Component Value Date/Time   NA 143 08/09/2014 1134   K 4.0 08/09/2014 1134   CL 106 08/09/2014 1134   CO2 31 08/09/2014 1134   GLUCOSE 121* 08/09/2014 1134   BUN 31* 08/09/2014 1134   CREATININE 1.45* 08/09/2014 1134   CALCIUM 10.2 08/09/2014 1134   GFRNONAA 37* 08/09/2014 1134   GFRAA 42* 08/09/2014 1134   Hepatic Function Panel     Component Value Date/Time   PROT 7.9 04/30/2014 0954   ALBUMIN 3.6 04/30/2014 0954   AST 18 04/30/2014 0954   ALT 12 04/30/2014 0954   ALKPHOS 68 04/30/2014 0954   BILITOT 0.4 04/30/2014 0954   BILIDIR 0.1 11/25/2013 1115    Filed Vitals:   09/08/14 0846  BP: 107/65  Pulse: 59  Resp: 20  Weight: 226 lb 8 oz (102.74 kg)  SpO2: 90%   Physical Exam Gen: Pleasant, well-nourished, in no distress, normal affect; on 5L O2.  HEENT: normal Neck: CVP 6-7 Carotids 2+ with bilateral radiated bruits Lungs: Decreased BS throughout. On 5 liters [place].  Cardiovascular: well healed sternotomy scar  PMI normal. RRR, heart sounds normal, 2/6 SEM RUSB radiated to carotids Extremities: No deformities, no cyanosis or clubbing, trace bilateral edema  Neuro: alert, non focal. Cranial nerves intact. Moves all 4 extremities without difficulty Skin: Warm, no lesions or rashes  1. 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     --PFTs and echo, severe parenchymal lung disease and not PAH.  Stable on 5L [place]. Had [place] in January with volume overload but no role for pulmonary vasodilators. Continue current diuretic regimen. Continue macitentan 10 mg daily.  2. OSA: Continue CPAP nightly.  3. Chronic diastolic CHF: Volume status stable. Continue current diuretic regimen.  4. CKD: Reviewed creatinine from January.  Stable 5. COPD: Severe, on home oxygen. Pulmonary Rehab referral pending.  6. Aortic root aneurysm s/p repair 2005 - stable by recent echo  Follow up in 3 months.    Nickey Kloepfer,NP-C  8:49 AM

## 2014-09-08 NOTE — Patient Instructions (Signed)
Doing great!  Follow up 3 months.

## 2014-09-09 ENCOUNTER — Other Ambulatory Visit: Payer: Self-pay

## 2014-09-09 DIAGNOSIS — Z1231 Encounter for screening mammogram for malignant neoplasm of breast: Secondary | ICD-10-CM

## 2014-09-15 NOTE — Telephone Encounter (Signed)
Per [employer]- no pre cert required

## 2014-09-20 ENCOUNTER — Telehealth (HOSPITAL_COMMUNITY): Payer: Self-pay | Admitting: *Deleted

## 2014-09-20 ENCOUNTER — Ambulatory Visit (HOSPITAL_COMMUNITY): Payer: BC Managed Care – HMO

## 2014-09-27 ENCOUNTER — Encounter (HOSPITAL_COMMUNITY)
Admission: RE | Admit: 2014-09-27 | Discharge: 2014-09-27 | Disposition: A | Discharge status: Home / Self Care | Payer: Medicare Other | Source: Ambulatory Visit | Attending: Internal Medicine | Admitting: Internal Medicine

## 2014-09-27 VITALS — BP 119/48 | HR 57

## 2014-09-27 DIAGNOSIS — I5030 Unspecified diastolic (congestive) heart failure: Secondary | ICD-10-CM | POA: Insufficient documentation

## 2014-09-27 DIAGNOSIS — Z9981 Dependence on supplemental oxygen: Secondary | ICD-10-CM | POA: Diagnosis not present

## 2014-09-27 DIAGNOSIS — I272 Other secondary pulmonary hypertension: Secondary | ICD-10-CM | POA: Insufficient documentation

## 2014-09-27 NOTE — Progress Notes (Signed)
April Mueller 69 y.o. female Pulmonary Rehab Orientation Note Patient arrived today in [facility] for orientation to Pulmonary Rehab. She was transported from [employer] via wheel chair. She does carry portable oxygen. Per pt, she uses oxygen continuously. Color good, skin warm and dry. Patient is oriented to time and place. Patient's medical history and medications reviewed. Heart rate is normal, breath sounds clear to auscultation, no wheezes, rales, or rhonchi. Grip strength equal, strong. Distal pulses 2+ bilateral posterior tibial pulses present. Patient reports she does take medications as prescribed. Patient states she follows a Low Sodium diet. The patient reports no specific efforts to gain or lose weight.  Patient's weight will be monitored closely. Demonstration and practice of PLB using pulse oximeter. Patient able to return demonstration satisfactorily. Safety and hand hygiene in the exercise area reviewed with patient. Patient voices understanding of the information reviewed. Department expectations discussed with patient and achievable goals were set. The patient shows enthusiasm about attending the program and we look forward to working with this nice lady.  She rated 3 on PHQ-2 scale and 7 on PHQ-11 scale.  She seems depressed and actually teared up when she said she takes her trash out at night so people will not look at her and feel sorry for her.  She is having difficulty adjusting to her health issues, but refused referral to a mental health physician.   The patient is scheduled for a 6 min walk test on Thursday, September 30, 2014 @ 3:30PM and to begin exercise on Tuesday, October 04, 2017 in the 1030 class.  [id number]

## 2014-09-30 ENCOUNTER — Encounter (HOSPITAL_COMMUNITY)
Admission: RE | Admit: 2014-09-30 | Discharge: 2014-09-30 | Disposition: A | Discharge status: Home / Self Care | Payer: Medicare Other | Source: Ambulatory Visit | Attending: Internal Medicine | Admitting: Internal Medicine

## 2014-09-30 DIAGNOSIS — I272 Other secondary pulmonary hypertension: Secondary | ICD-10-CM | POA: Diagnosis not present

## 2014-09-30 NOTE — Progress Notes (Signed)
[name] completed a Six-Minute Walk Test on 09/30/14 . [name] walked 556 feet with 5 breaks lasting 2 minutes and 41 seconds total.  The patient's lowest oxygen saturation was 91 , highest heart rate was 70 , and highest blood pressure was 120/80. The patient was on 6 liters of oxygen with a nasal cannula. April Mueller stated that lower back pain hindered their walk test.

## 2014-10-05 ENCOUNTER — Encounter (HOSPITAL_COMMUNITY)
Admission: RE | Admit: 2014-10-05 | Discharge: 2014-10-05 | Disposition: A | Discharge status: Home / Self Care | Payer: Medicare Other | Source: Ambulatory Visit | Attending: Internal Medicine | Admitting: Internal Medicine

## 2014-10-05 DIAGNOSIS — Z9981 Dependence on supplemental oxygen: Secondary | ICD-10-CM | POA: Insufficient documentation

## 2014-10-05 DIAGNOSIS — I272 Other secondary pulmonary hypertension: Secondary | ICD-10-CM | POA: Insufficient documentation

## 2014-10-05 DIAGNOSIS — I5030 Unspecified diastolic (congestive) heart failure: Secondary | ICD-10-CM | POA: Diagnosis not present

## 2014-10-05 NOTE — Progress Notes (Signed)
Today, April Mueller exercised at [facility]. Service time was from 1030 to 1215.  The patient exercised for more than 31 minutes performing aerobic, strengthening, and stretching exercises. Oxygen saturation, heart rate, blood pressure, rate of perceived exertion, and shortness of breath were all monitored before, during, and after exercise. April Mueller presented with no problems at today's exercise session.   There was no workload change during today's exercise session.  Pre-exercise vitals: . Weight kg: 101.4 . Liters of O2: 6L . SpO2: 98 . HR: 60 . BP: 102/70 . CBG: na  Exercise vitals: . Highest heartrate:  64 . Lowest oxygen saturation: 97 . Highest blood pressure: 106/58 . Liters of 02: 6L  Post-exercise vitals: . SpO2: 100 . HR: 61 . BP: 102/64 . Liters of O2: 6L . CBG: na  Dr. Brand Males, Medical Director Dr. Candiss Norse is immediately available during today's Pulmonary Rehab session for April Mueller on 1030 at 1215 class time.

## 2014-10-07 ENCOUNTER — Encounter (HOSPITAL_COMMUNITY)
Admission: RE | Admit: 2014-10-07 | Discharge: 2014-10-07 | Disposition: A | Discharge status: Home / Self Care | Payer: Medicare Other | Source: Ambulatory Visit | Attending: Internal Medicine | Admitting: Internal Medicine

## 2014-10-07 DIAGNOSIS — I272 Other secondary pulmonary hypertension: Secondary | ICD-10-CM | POA: Diagnosis not present

## 2014-10-07 NOTE — Progress Notes (Signed)
Today, April Mueller exercised at [facility]. Service time was from 10:30am to 12:40pm.  The patient exercised for more than 31 minutes performing aerobic, strengthening, and stretching exercises. Oxygen saturation, heart rate, blood pressure, rate of perceived exertion, and shortness of breath were all monitored before, during, and after exercise. April Mueller presented with no problems at today's exercise session. The patient attended education today with Trish Fountain on Anatomy, Physiology and Intimacy  There was no workload change during today's exercise session.  Pre-exercise vitals: . Weight kg: 101.2 . Liters of O2: 2 . SpO2: 92 . HR: 61 . BP: 112/78 . CBG: na  Exercise vitals: . Highest heartrate:  65 . Lowest oxygen saturation: 97 . Highest blood pressure: 134/70 . Liters of 02: 6  Post-exercise vitals: . SpO2: 98 . HR: 60 . BP: 120/60 . Liters of O2: 6 . CBG: na  Dr. Brand Males, Medical Director Dr. Waldron Labs is immediately available during today's Pulmonary Rehab session for April Mueller on 10/07/14 at 10:30am class time.

## 2014-10-11 ENCOUNTER — Ambulatory Visit
Admission: RE | Admit: 2014-10-11 | Discharge: 2014-10-11 | Disposition: A | Discharge status: Home / Self Care | Payer: Medicare Other | Source: Ambulatory Visit

## 2014-10-11 DIAGNOSIS — Z1231 Encounter for screening mammogram for malignant neoplasm of breast: Secondary | ICD-10-CM

## 2014-10-12 ENCOUNTER — Encounter (HOSPITAL_COMMUNITY)
Admission: RE | Admit: 2014-10-12 | Discharge: 2014-10-12 | Disposition: A | Discharge status: Home / Self Care | Payer: Medicare Other | Source: Ambulatory Visit | Attending: Internal Medicine | Admitting: Internal Medicine

## 2014-10-12 DIAGNOSIS — I272 Other secondary pulmonary hypertension: Secondary | ICD-10-CM | POA: Diagnosis not present

## 2014-10-12 NOTE — Progress Notes (Signed)
Today, April Mueller exercised at [facility]. Service time was from 10:30am to 12:10.  The patient exercised for more than 31 minutes performing aerobic, strengthening, and stretching exercises. Oxygen saturation, heart rate, blood pressure, rate of perceived exertion, and shortness of breath were all monitored before, during, and after exercise. April Mueller presented with no problems at today's exercise session.   There was an increase in workload change during today's exercise session.  Pre-exercise vitals: . Weight kg: 100.4 . Liters of O2: 6 . SpO2: 100 . HR: 61 . BP: 106/60 . CBG: na  Exercise vitals: . Highest heartrate:  65 . Lowest oxygen saturation: 94 . Highest blood pressure: 110/60 . Liters of 02: 6  Post-exercise vitals: . SpO2: 100 . HR: 61 . BP: 108/60 . Liters of O2: 6 . CBG: na  Dr. Brand Males, Medical Director Dr. Waldron Labs is immediately available during today's Pulmonary Rehab session for April Mueller on 10/12/14 at 10:30am class time.

## 2014-10-14 ENCOUNTER — Encounter (HOSPITAL_COMMUNITY)
Admission: RE | Admit: 2014-10-14 | Discharge: 2014-10-14 | Disposition: A | Discharge status: Home / Self Care | Payer: Medicare Other | Source: Ambulatory Visit | Attending: Internal Medicine | Admitting: Internal Medicine

## 2014-10-14 DIAGNOSIS — I272 Other secondary pulmonary hypertension: Secondary | ICD-10-CM | POA: Diagnosis not present

## 2014-10-18 NOTE — Progress Notes (Signed)
Today, April Mueller exercised at [facility]. Service time was from 10:30am to 12:10pm.  The patient exercised for more than 31 minutes performing aerobic, strengthening, and stretching exercises. Oxygen saturation, heart rate, blood pressure, rate of perceived exertion, and shortness of breath were all monitored before, during, and after exercise. April Mueller presented with no problems at today's exercise session. The patient attended education on Pulmonary Medications.  There was an increase in workload change during today's exercise session.  Pre-exercise vitals: . Weight kg: 101.1 . Liters of O2: 4 . SpO2: 99 . HR: 55 . BP: 104/54 . CBG: na  Exercise vitals: . Highest heartrate:  71 . Lowest oxygen saturation: 96 . Highest blood pressure: 114/70 . Liters of 02: 6  Post-exercise vitals: . SpO2: 100 . HR: 67 . BP: 102/76 . Liters of O2: 6 . CBG: na  Dr. Brand Males, Medical Director Dr. Waldron Labs is immediately available during today's Pulmonary Rehab session for April Mueller on 10/14/14 at 10:30am class time.

## 2014-10-19 ENCOUNTER — Ambulatory Visit: Payer: BC Managed Care – HMO | Admitting: Emergency Medicine

## 2014-10-19 ENCOUNTER — Encounter (HOSPITAL_COMMUNITY)
Admission: RE | Admit: 2014-10-19 | Discharge: 2014-10-19 | Disposition: A | Discharge status: Home / Self Care | Payer: Medicare Other | Source: Ambulatory Visit | Attending: Internal Medicine | Admitting: Internal Medicine

## 2014-10-19 DIAGNOSIS — I272 Other secondary pulmonary hypertension: Secondary | ICD-10-CM | POA: Diagnosis not present

## 2014-10-19 NOTE — Progress Notes (Signed)
Today, April Mueller exercised at [facility]. Service time was from 1030 to 1215.  The patient exercised for more than 31 minutes performing aerobic, strengthening, and stretching exercises. Oxygen saturation, heart rate, blood pressure, rate of perceived exertion, and shortness of breath were all monitored before, during, and after exercise. April Mueller presented with no problems at today's exercise session.   There was a workload change during today's exercise session.  Pre-exercise vitals: . Weight kg: 102.4 . Liters of O2: 4L . SpO2: 96 . HR: 55 . BP: 102/54 . CBG: na  Exercise vitals: . Highest heartrate:  62 . Lowest oxygen saturation: 95 . Highest blood pressure: 104/70 . Liters of 02: 6L  Post-exercise vitals: . SpO2: 98 . HR: 60 . BP: 108/60 . Liters of O2: 6L . CBG: na  Dr. Brand Males, Medical Director Dr. Charlies Silvers is immediately available during today's Pulmonary Rehab session for April Mueller on 10/19/2014 at 1030 class time.

## 2014-10-21 ENCOUNTER — Encounter (HOSPITAL_COMMUNITY)
Admission: RE | Admit: 2014-10-21 | Discharge: 2014-10-21 | Disposition: A | Discharge status: Home / Self Care | Payer: Medicare Other | Source: Ambulatory Visit | Attending: Internal Medicine | Admitting: Internal Medicine

## 2014-10-21 DIAGNOSIS — I272 Other secondary pulmonary hypertension: Secondary | ICD-10-CM | POA: Diagnosis not present

## 2014-10-21 NOTE — Progress Notes (Signed)
Today, [name] exercised at [facility]. Service time was from 1030 to 1245.  The patient exercised for more than 31 minutes performing aerobic, strengthening, and stretching exercises. Oxygen saturation, heart rate, blood pressure, rate of perceived exertion, and shortness of breath were all monitored before, during, and after exercise. Morayma presented with no problems at today's exercise session. Patient attended the nutrition class today.  There was no workload change during today's exercise session.  Pre-exercise vitals: . Weight kg: 101.7 . Liters of O2: 4 . SpO2: 94 . HR: 55 . BP: 108/66 . CBG: na  Exercise vitals: . Highest heartrate: 68  . Lowest oxygen saturation: 97 . Highest blood pressure: 100/60 . Liters of 02: 6  Post-exercise vitals: . SpO2: 97 . HR: 60 . BP: 100/60 . Liters of O2: 6 . CBG: na Dr. Brand Males, Medical Director Dr. Frederic Jericho is immediately available during today's Pulmonary Rehab session for GIORGIA WAHLER on 10/21/2014 at 1030 class time.  [name]

## 2014-10-26 ENCOUNTER — Encounter (HOSPITAL_COMMUNITY)
Admission: RE | Admit: 2014-10-26 | Discharge: 2014-10-26 | Disposition: A | Discharge status: Home / Self Care | Payer: Medicare Other | Source: Ambulatory Visit | Attending: Internal Medicine | Admitting: Internal Medicine

## 2014-10-26 DIAGNOSIS — I272 Other secondary pulmonary hypertension: Secondary | ICD-10-CM | POA: Diagnosis not present

## 2014-10-26 NOTE — Progress Notes (Signed)
I discussed home exercise with the patient today.  Patient did not feel ready to start walking at home.  Patient states that she is afraid to walk outside of her apartment complex because she doesn't want anyone to see her struggling.  We discussed walking at [facility] and looking into the exercise facility at her apartment complex. Patient is going to look into these options and we are going to reconvene regarding her home exercise in two weeks.

## 2014-10-26 NOTE — Progress Notes (Signed)
Today, April Mueller exercised at [facility]. Service time was from 10:30am to 12:25pm.  The patient exercised by preforming aerobic, strengthening, and stretching exercises. Oxygen saturation, heart rate, blood pressure, rate of perceived exertion, and shortness of breath were all monitored before, during, and after exercise. April Mueller presented with no problems at today's exercise session. Patient attended education today on Pursed Lip and Diaphragmatic Breathing.  Pre-exercise vitals: . Weight kg: 100.8 . Liters of O2: 4 . SpO2: 100 . HR: 60 . BP: 104/60 . CBG: na  Exercise vitals: . Highest heartrate:  67 . Lowest oxygen saturation: 94 . Highest blood pressure: 100/60 . Liters of 02: 6  Post-exercise vitals: . SpO2: 99 . HR: 61 . BP: 102/64 . Liters of O2: 6 . CBG: na  Dr. Brand Males, Medical Director Dr. Frederic Jericho is immediately available during today's Pulmonary Rehab session for April Mueller on 10/26/14 at 10:30am class time.

## 2014-10-28 ENCOUNTER — Encounter (HOSPITAL_COMMUNITY)
Admission: RE | Admit: 2014-10-28 | Discharge: 2014-10-28 | Disposition: A | Discharge status: Home / Self Care | Payer: Medicare Other | Source: Ambulatory Visit | Attending: Internal Medicine | Admitting: Internal Medicine

## 2014-10-28 DIAGNOSIS — I272 Other secondary pulmonary hypertension: Secondary | ICD-10-CM | POA: Diagnosis not present

## 2014-10-28 NOTE — Progress Notes (Signed)
Today, [name] exercised at [facility]. Service time was from 1030 to 1215.  The patient exercised by performing aerobic, strengthening, and stretching exercises. Oxygen saturation, heart rate, blood pressure, rate of perceived exertion, and shortness of breath were all monitored before, during, and after exercise. [name] presented with no problems at today's exercise session. [name] also attended an education session on risk factor reduction. There was a workload change during today's exercise session.   Pre-exercise vitals: . Weight kg: 101.8 . Liters of O2: 4L . SpO2: 100 . HR: 61 . BP: 102/68 . CBG: na  Exercise vitals: . Highest heartrate:  63 . Lowest oxygen saturation: 99 . Highest blood pressure: 100/60 . Liters of 02: 6L  Post-exercise vitals: . SpO2: 100 . HR: 57 . BP: 102/60 . Liters of O2: 6L . CBG: na  Dr. Brand Males, Medical Director Dr. Sheran Fava is immediately available during today's Pulmonary Rehab session for April Mueller on 10/28/2014 at 1030 class time.

## 2014-11-02 ENCOUNTER — Encounter (HOSPITAL_COMMUNITY): Payer: Medicare Other

## 2014-11-04 ENCOUNTER — Encounter (HOSPITAL_COMMUNITY): Payer: Medicare Other

## 2014-11-09 ENCOUNTER — Encounter (HOSPITAL_COMMUNITY)
Admission: RE | Admit: 2014-11-09 | Discharge: 2014-11-09 | Disposition: A | Discharge status: Home / Self Care | Payer: Medicare Other | Source: Ambulatory Visit | Attending: Internal Medicine | Admitting: Internal Medicine

## 2014-11-09 DIAGNOSIS — I272 Other secondary pulmonary hypertension: Secondary | ICD-10-CM | POA: Insufficient documentation

## 2014-11-09 DIAGNOSIS — Z9981 Dependence on supplemental oxygen: Secondary | ICD-10-CM | POA: Diagnosis not present

## 2014-11-09 DIAGNOSIS — I5030 Unspecified diastolic (congestive) heart failure: Secondary | ICD-10-CM | POA: Diagnosis not present

## 2014-11-09 NOTE — Progress Notes (Signed)
Today, April Mueller exercised at [facility]. Service time was from 10:30am to 12:10pm.  The patient exercised by performing aerobic, strengthening, and stretching exercises. Oxygen saturation, heart rate, blood pressure, rate of perceived exertion, and shortness of breath were all monitored before, during, and after exercise. April Mueller presented with no problems at today's exercise session.  The patient did not have an increase in workload intensity during today's exercise session.  Pre-exercise vitals: . Weight kg: 101.5 . Liters of O2: 4 . SpO2: 96 . HR: 58 . BP: 100/52 . CBG: na  Exercise vitals: . Highest heartrate:  64 . Lowest oxygen saturation: 97 . Highest blood pressure: 100/60 . Liters of 02: 6  Post-exercise vitals: . SpO2: 100 . HR: 61 . BP: 100/54 . Liters of O2: 3 . CBG: na  Dr. Brand Males, Medical Director Dr. Clementeen Graham is immediately available during today's Pulmonary Rehab session for April Mueller on 11/09/14 at 10:30am class time.

## 2014-11-11 ENCOUNTER — Encounter (HOSPITAL_COMMUNITY): Payer: Medicare Other

## 2014-11-12 ENCOUNTER — Encounter: Payer: Self-pay | Admitting: Emergency Medicine

## 2014-11-12 ENCOUNTER — Ambulatory Visit (INDEPENDENT_AMBULATORY_CARE_PROVIDER_SITE_OTHER): Payer: Medicare Other | Admitting: Emergency Medicine

## 2014-11-12 VITALS — BP 110/60 | HR 80 | Ht 64.0 in | Wt 222.0 lb

## 2014-11-12 DIAGNOSIS — I272 Other secondary pulmonary hypertension: Secondary | ICD-10-CM

## 2014-11-12 DIAGNOSIS — IMO0002 Reserved for concepts with insufficient information to code with codable children: Secondary | ICD-10-CM

## 2014-11-12 DIAGNOSIS — Z9989 Dependence on other enabling machines and devices: Principal | ICD-10-CM

## 2014-11-12 DIAGNOSIS — J449 Chronic obstructive pulmonary disease, unspecified: Secondary | ICD-10-CM | POA: Diagnosis not present

## 2014-11-12 DIAGNOSIS — G4733 Obstructive sleep apnea (adult) (pediatric): Secondary | ICD-10-CM | POA: Diagnosis not present

## 2014-11-12 NOTE — Patient Instructions (Signed)
Please continue your Opsumit for now. We may decide to consider stopping this medication in the future depending on how you are doing Please continue your oxygen and she had been using it Continue your pulmonary rehabilitation Follow with Dr Lamonte Sakai in 3 months or sooner if you have any problems.

## 2014-11-12 NOTE — Assessment & Plan Note (Signed)
She has mild-to-moderate pulmonary hypertension with a normal PVR and elevated wedge pressure. We have had her on medications ever since of met her. She is quite fearful of stopping the Opsumit - she was told at one time that with her rheumatoid arthritis if she were to stop treatment for pulmonary hypertension then she would become a respiratory cripple. We will continue meds for now, but I suspect that we would be stop the opsumit and potentially make her feel better not worse. We will discuss a trial off the medication after she has completed pulmonary rehabilitation.   75% of this 20 minute visit visit was spent counseling regarding these medications

## 2014-11-12 NOTE — Progress Notes (Signed)
Subjective:    Patient ID: April Mueller, female    DOB: Jul 22, 1946, 69 y.o.   MRN: [id number]  HPI 69 yo, former smoker (28 pk-yrs), hx of HTN, OSA on CPAP, Rheumatoid arthritis on plaquenil. Hx AAA 2005 c/b ARF and 3 months of HD (now off). 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 She moved back to [place] from [place] in 8/13, is referred by Dr Melinda Crutch for [facility] that has been managed on bosentan 129m bid + sildenifil 224mtid, supplemental O2 4L/min rest, up to 5L/min exertion.   She reports a worsening in her dyspnea over the last 3 months. No CP. No wheeze or cough. She is able to exert some, able to walk when she takes her time. Her nose is congested. [place]   She has had PFT, CT scan chest, R heart cath, [place]   ROV 12/11/12 -- f/u for COPD, RA, OSA and secondary PAH. 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 She is planning to see Dr HaTrudie Reedor Rheum, hasn't done so yet. She has been clinically stable. Using lasix 40 daily, increased zaroxolyn on her own to about 3 x a week. Has been doing this for a week.   ROV 01/20/13 -- COPD, RA, OSA and secondary PAH, severe AFL [place] FEV[place] 0.65L [place]  [place] > PASP 56, L atrial enlargement w mild to mod MR.   ROV 06/09/13 -- COPD, RA, OSA and secondary PAH (PASP 56), severe [place] On bosentan, stopped sildenifil and she didn't miss it. I asked her to see Dr BeHaroldine Lawsegarding her moderate MR - no indication for MVR (and she is a poor candidate). 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    ROV 07/16/13 -- COPD, RA, OSA and secondary PAH (PASP 56), severe [place] 0.65L [place], also added Anoro to see if she would benefit. She did not benefit from the anoro.  The tracleer was tapered down in discussion with Dr BeHaroldine Lawsbut she did not tolerate > had increased SOB, decreased functional capacity and restarted. LFT normal on 11/26. She had V/q scan 11/28 >> low prob study. Some subjective wt gain, ? How much. 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 She is on O2, bosentan, CPAP. She is on metolazone and lasix, stable doses. Edema has been stable. She did not benefit from inhaled BD's. Last TTE was 5/'14. She is on 4L/min at all times. She has good compliance with CPAP.   ROV 03/30/14 -- follows for secondary PAH due to severe obstructive disease, RA, OSA. She has not noticed benefit from BD's despite her PFT.  Her V/q was low prob, her CT scan did not show ILD. She is on Tracleer, last LFT were done by Dr HaTrudie Reedn 8/'15. She is feeling stable, wearing O2 and CPAP  ROV 04/30/14 -- follow up visit for multifactorial secondary PAH. She is currently on 5L/min pulsed O2. She completed 6 minute walk 04/14/14 > [id number] on 5L/min pulsed. We've done the paperwork for macitentan, should be able to start next week. Will stop tracleer  when it is available. If she still has dyspnea then would favor a trial of inhaled prostacyclin. She is compliant w CPAP. Needs LFT today.   ROV 06/02/14 -- follow up for secondary PAH on therapy; hx of OSA, COPD, RA. We have evaluated for VTE and ILD, both negative. We have been changing from bosentan to macitentan > currently on . She was titrated to 3L/min continuous via oximizer w exertion. She is requesting a HH eval. She is having more dyspnea since stopping bosentan.  She has ben on the new medication x 1 month. She has not had a new R heart cath yet. She is not on BD's > she states they have never helped her (although she has obstructive disease by spiro).   ROV 08/04/14 -- follow up visit, hx of  secondary PAH on therapy; hx of OSA, COPD, RA. She  is now on macetentan x about 3 months, she hasn't noticed any interval change, in fact she still believes that she is worse than she was on bosentan. She hasn';t had R heart cath yet. She is hypertensive today. Has gained 2 lbs.   ROV 11/12/14 -- follow-up visit for severe secondary primary hypertension due to COPD, rheumatoid arthritis, and obstructive sleep apnea, most recently on macetentan now for 6 months. She underwent right heart catheterization in January '16.  Showed moderate secondary PAH, with normal PVR, . Suspect she does . She is on 6L/min at rest, adjusts for exercise at pulm rehab. She is scared to stop the macetentan. Her lasix was increased slightly.    Review of Systems  Constitutional: Negative for fever and unexpected weight change.  HENT: Negative for congestion, dental problem, ear pain, nosebleeds, postnasal drip, rhinorrhea, sinus pressure, sneezing, sore throat and trouble swallowing.   Eyes: Negative for redness and itching.  Respiratory: Positive for shortness of breath. Negative for cough, chest tightness and wheezing.   Cardiovascular: Positive for leg swelling. Negative for palpitations.  Gastrointestinal: Negative for nausea, vomiting and diarrhea.  Genitourinary: Negative for dysuria.  Musculoskeletal: Negative for joint swelling.  Skin: Negative for rash.  Neurological: Negative for headaches.  Hematological: Bruises/bleeds easily.  Psychiatric/Behavioral: Negative for dysphoric mood. The patient is not nervous/anxious.     Hepatic Function Latest Ref Rng 04/30/2014 11/25/2013 10/21/2013  Total Protein 6.0 - 8.3 g/dL 7.9 7.6 7.7  Albumin 3.5 - 5.2 g/dL 3.6 3.5 3.5  AST 0 - 37 U/L $Remo'18 20 15  'SUjXf$ ALT 0 - 35 U/L $Remo'12 14 9  'bapfC$ Alk Phosphatase 39 - 117 U/L 68 62 70  Total Bilirubin 0.2 - 1.2 mg/dL 0.4 0.4 <0.2(L)  Bilirubin, Direct 0.0 - 0.3 mg/dL - 0.1 -    08/18/14 --  Findings:  RA = 14 RV = 62/3/15 PA = 63/21 (38) PCW = mean 23 v waves to 40 Fick cardiac  output/index = 6.3/3.2 Thermodilution CO/CI = 6.1/3.1 PVR = 2.0 WU FA sat = 91% PA sat = 57%, 62%  Assessment: 1. Mild to moderate pulmonary HTN with normal PVR which suggests pulmonary venous HTN 2. Moderately elevated left-sided pressures in the setting of her not taking lasix this am 3. Normal cardiac output      Objective:   Physical Exam Filed Vitals:   11/12/14 1430  BP: 110/60  Pulse: 80  Height: $Remove'5\' 4"'XZpTIUM$  (1.626 m)  Weight: 222 lb (100.699 kg)  SpO2: 90%   Gen: Pleasant, well-nourished, in no distress,  normal affect  ENT: No lesions,  mouth clear,  oropharynx clear,  no postnasal drip  Neck: No JVD, no TMG, no carotid bruits  Lungs: No use of accessory muscles, distant, clear without rales or rhonchi  Cardiovascular: RRR, heart sounds normal, no murmur or gallops, no peripheral edema  Musculoskeletal: No deformities, no cyanosis or clubbing, trace to 1+ ankle edema  Neuro: alert, non focal  Skin: Warm, no lesions or rashes   Study Conclusions 12/17/12 --  - Left ventricle: The cavity size was normal. Wall thickness was normal. Systolic function was normal. The estimated ejection fraction was in the range of 55% to 60%. Wall motion was normal; there were no regional wall motion abnormalities. - Mitral valve: MR is eccentric and directed posteriorl somewhat hard to judge extent Mildly calcified annulus. Mildly thickened leaflets . Mild to moderate regurgitation. - Left atrium: The atrium was mildly dilated. - Right ventricle: The cavity size was mildly dilated. - Right atrium: The atrium was mildly dilated. - Atrial septum: The septum bowed from left to right, consistent with increased left atrial pressure. - Pulmonary arteries: PA peak pressure: 25mm Hg (S).      Assessment & Plan:  OSA on CPAP Continue CPAP as she had been using it   COPD (chronic obstructive pulmonary disease) We have tried bronchodilators but she has never benefited. I will  consider restarting these at some point in the future   Secondary pulmonary hypertension She has mild-to-moderate pulmonary hypertension with a normal PVR and elevated wedge pressure. We have had her on medications ever since of met her. She is quite fearful of stopping the Opsumit - she was told at one time that with her rheumatoid arthritis if she were to stop treatment for pulmonary hypertension then she would become a respiratory cripple. We will continue meds for now, but I suspect that we would be stop the opsumit and potentially make her feel better not worse. We will discuss a trial off the medication after she has completed pulmonary rehabilitation.   75% of this 20 minute visit visit was spent counseling regarding these medications

## 2014-11-12 NOTE — Assessment & Plan Note (Signed)
Continue CPAP as she had been using it

## 2014-11-12 NOTE — Assessment & Plan Note (Signed)
We have tried bronchodilators but she has never benefited. I will consider restarting these at some point in the future

## 2014-11-16 ENCOUNTER — Encounter (HOSPITAL_COMMUNITY)
Admission: RE | Admit: 2014-11-16 | Discharge: 2014-11-16 | Disposition: A | Discharge status: Home / Self Care | Payer: Medicare Other | Source: Ambulatory Visit | Attending: Internal Medicine | Admitting: Internal Medicine

## 2014-11-16 DIAGNOSIS — I272 Other secondary pulmonary hypertension: Secondary | ICD-10-CM | POA: Diagnosis not present

## 2014-11-16 NOTE — Progress Notes (Signed)
I have reviewed a Home Exercise Prescription with April Mueller . April Mueller is not currently exercising at home.  The patient was advised to walk 2-3 days a week for 20 minutes.  April Mueller and I discussed how to progress their exercise prescription.  The patient stated that their goals were to be able to walk long distances without having to stop and catch her breath.  The patient stated that they understand the exercise prescription.  We reviewed exercise guidelines, target heart rate during exercise, oxygen use, weather, home pulse oximeter, endpoints for exercise, and goals.  Patient is encouraged to come to me with any questions. I will continue to follow up with the patient to assist them with progression and safety.

## 2014-11-16 NOTE — Progress Notes (Signed)
Today, [name] exercised at [employer]. Service time was from 1030 to 1210.  The patient exercised by performing aerobic, strengthening, and stretching exercises. Oxygen saturation, heart rate, blood pressure, rate of perceived exertion, and shortness of breath were all monitored before, during, and after exercise. April Mueller presented with no problems at today's exercise session.  The patient did have an increase in workload intensity during today's exercise session.  Pre-exercise vitals: . Weight kg: 101.4 . Liters of O2: 4L . SpO2: 97 . HR: 70 . BP: 104/70 . CBG: na  Exercise vitals: . Highest heartrate:  70 . Lowest oxygen saturation: 93 . Highest blood pressure: 108/80 . Liters of 02: 6L  Post-exercise vitals: . SpO2: 99 . HR: 66 . BP: 104/78 . Liters of O2: 3L . CBG: na  Dr. Brand Males, Medical Director Dr. Clementeen Graham is immediately available during today's Pulmonary Rehab session for KRYSTIE LEITER on 11/16/2014 at 1030 class time.

## 2014-11-18 ENCOUNTER — Encounter (HOSPITAL_COMMUNITY)
Admission: RE | Admit: 2014-11-18 | Discharge: 2014-11-18 | Disposition: A | Discharge status: Home / Self Care | Payer: Medicare Other | Source: Ambulatory Visit | Attending: Internal Medicine | Admitting: Internal Medicine

## 2014-11-18 DIAGNOSIS — I272 Other secondary pulmonary hypertension: Secondary | ICD-10-CM | POA: Diagnosis not present

## 2014-11-18 NOTE — Progress Notes (Signed)
Today, April Mueller exercised at [employer]. April Mueller. Service time was from 1030 to 1220.  The patient exercised by performing aerobic, strengthening, and stretching exercises. Oxygen saturation, heart rate, blood pressure, rate of perceived exertion, and shortness of breath were all monitored before, during, and after exercise. April Mueller presented with no problems at today's exercise session. April Mueller also attended an education session with the respiratory therapist.  The patient did not have an increase in workload intensity during today's exercise session.  Pre-exercise vitals: . Weight kg: 102.0 . Liters of O2: 4L . SpO2: 100 . HR: 60 . BP: 104/64 . CBG: na  Exercise vitals: . Highest heartrate:  70 . Lowest oxygen saturation: 94 . Highest blood pressure: 132/70 . Liters of 02: 3L  Post-exercise vitals: . SpO2: 94 . HR: 63 . BP: 114/60 . Liters of O2: 3L . CBG: na  Dr. Brand Males, Medical Director Dr. Wyline Copas is immediately available during today's Pulmonary Mueller session for April Mueller on 11/18/2014 at 1030 class time.

## 2014-11-19 ENCOUNTER — Telehealth: Payer: Self-pay | Admitting: Emergency Medicine

## 2014-11-19 DIAGNOSIS — J449 Chronic obstructive pulmonary disease, unspecified: Secondary | ICD-10-CM

## 2014-11-19 NOTE — Telephone Encounter (Signed)
Spoke with Leafy Ro at [facility], RT was at pt's home for over 1.5 hours going over how to properly use 02.  RT is wanting an order for an 02 conserving device titration study to make sure that pt still qualifies for poc.  Dr. B are you ok with this order?  Thanks!

## 2014-11-19 NOTE — Telephone Encounter (Signed)
Order placed. Nothing further needed. 

## 2014-11-19 NOTE — Telephone Encounter (Signed)
Yes OK to order 

## 2014-11-23 ENCOUNTER — Encounter (HOSPITAL_COMMUNITY)
Admission: RE | Admit: 2014-11-23 | Discharge: 2014-11-23 | Disposition: A | Discharge status: Home / Self Care | Payer: Medicare Other | Source: Ambulatory Visit | Attending: Internal Medicine | Admitting: Internal Medicine

## 2014-11-23 DIAGNOSIS — I272 Other secondary pulmonary hypertension: Secondary | ICD-10-CM | POA: Diagnosis not present

## 2014-11-23 NOTE — Progress Notes (Signed)
Today, April Mueller exercised at [employer]. [facility]. Service time was from 10:30am to 12:10pm.  The patient exercised by performing aerobic, strengthening, and stretching exercises. Oxygen saturation, heart rate, blood pressure, rate of perceived exertion, and shortness of breath were all monitored before, during, and after exercise. April Mueller presented with no problems at today's exercise session.  The patient did not have an increase in workload intensity during today's exercise session.  Pre-exercise vitals: . Weight kg: 101.2 . Liters of O2: 3 . SpO2: 100 . HR: 59 . BP: 106/62 . CBG: na  Exercise vitals: . Highest heartrate:  67 . Lowest oxygen saturation: 95 . Highest blood pressure: 98/64 . Liters of 02: 4  Post-exercise vitals: . SpO2: 100 . HR: 82 . BP: 104/52 . Liters of O2: 3 . CBG: na  Dr. Brand Males, Medical Director Dr. Wyline Copas is immediately available during today's Pulmonary Rehab session for April Mueller on 11/23/14 at 10:30am class time.

## 2014-11-25 ENCOUNTER — Encounter (HOSPITAL_COMMUNITY)
Admission: RE | Admit: 2014-11-25 | Discharge: 2014-11-25 | Disposition: A | Discharge status: Home / Self Care | Payer: Medicare Other | Source: Ambulatory Visit | Attending: Internal Medicine | Admitting: Internal Medicine

## 2014-11-25 DIAGNOSIS — I272 Other secondary pulmonary hypertension: Secondary | ICD-10-CM | POA: Diagnosis not present

## 2014-11-25 NOTE — Progress Notes (Signed)
Today, [name] exercised at [employer]. [facility]. Service time was from 1030 to 1230.  The patient exercised by performing aerobic, strengthening, and stretching exercises. Oxygen saturation, heart rate, blood pressure, rate of perceived exertion, and shortness of breath were all monitored before, during, and after exercise. Kindra presented with no problems at today's exercise session. Taneshia also attended an education session with [employer] [facility].  The patient did not have an increase in workload intensity during today's exercise session.  Pre-exercise vitals: . Weight kg: 101.9 . Liters of O2: 3L . SpO2: 100 . HR: 65 . BP: 110/72 . CBG: na  Exercise vitals: . Highest heartrate:  65 . Lowest oxygen saturation: 98 . Highest blood pressure: 126/60 . Liters of 02: 6L  Post-exercise vitals: . SpO2: 93 . HR: 61 . BP: 114/57 . Liters of O2: 4L . CBG: na  Dr. Brand Males, Medical Director Dr. Sheran Fava is immediately available during today's Pulmonary Rehab session for PRICILA BRIDGE on 11/25/2014 at 1030 class time.

## 2014-11-30 ENCOUNTER — Encounter (HOSPITAL_COMMUNITY)
Admission: RE | Admit: 2014-11-30 | Discharge: 2014-11-30 | Disposition: A | Discharge status: Home / Self Care | Payer: Medicare Other | Source: Ambulatory Visit | Attending: Internal Medicine | Admitting: Internal Medicine

## 2014-11-30 DIAGNOSIS — I272 Other secondary pulmonary hypertension: Secondary | ICD-10-CM | POA: Diagnosis not present

## 2014-11-30 NOTE — Progress Notes (Signed)
Sheldon Silvan 69 y.o. female Nutrition Note Spoke with pt. Pt is obese.  Pt eats 1 meals a day; mostly prepared at home.  Making healthy food choices the majority of the time.  Pt's Rate Your Plate results reviewed with pt. Pt does not avoid salty food; uses canned/ convenience food.  Pt does not add salt to food.  The role of sodium in lung disease reviewed with pt. Pt expressed understanding of the information reviewed.    Nutrition Diagnosis ? Food-and nutrition-related knowledge deficit related to lack of exposure to information as related to diagnosis of pulmonary disease ? Obesity related to excessive energy intake as evidenced by a BMI of 39.3 ?  Nutrition Rx/Est. Daily Nutrition Needs for: ? wt loss  1550 Kcal  80 gm protein   1500 mg or less sodium     Nutrition Intervention ? Pt's individual nutrition plan and goals reviewed with pt. ? Benefits of adopting healthy eating habits discussed when pt's Rate Your Plate reviewed. ? Pt to attend the Nutrition and Lung Disease class ? Continual client-centered nutrition education by RD, as part of interdisciplinary care. Goal(s) 1. Pt to identify and limit food sources of sodium. 2. Identify food quantities necessary to achieve wt loss of  -2# per week to a goal wt of 91-95 kg (200-209 lb) at graduation from pulmonary rehab. 3. Describe the benefit of including fruits, vegetables, whole grains, and low-fat dairy products in a healthy meal plan.  Monitor and Evaluate progress toward nutrition goal with team.   Wynona Dove, MS Dietetic Intern  Derek Mound, M.Ed, RD, LDN, CDE 11/30/2014 11:05 AM

## 2014-11-30 NOTE — Progress Notes (Signed)
Today, April Mueller exercised at [employer]. [facility]. Service time was from 1030 to 1220.  The patient exercised by performing aerobic, strengthening, and stretching exercises. Oxygen saturation, heart rate, blood pressure, rate of perceived exertion, and shortness of breath were all monitored before, during, and after exercise. April Mueller presented with no problems at today's exercise session.  The patient did not have an increase in workload intensity during today's exercise session.  Pre-exercise vitals: . Weight kg: 102.1 . Liters of O2: 3 . SpO2: 100 . HR: 58 . BP: 100/60 . CBG: na  Exercise vitals: . Highest heartrate:  65 . Lowest oxygen saturation: 93 . Highest blood pressure: 108/58 . Liters of 02: 4-6  Post-exercise vitals: . SpO2: 100 . HR: 59 . BP: 104/70 . Liters of O2: 4 . CBG: na Dr. Brand Males, Medical Director Dr. Sheran Fava is immediately available during today's Pulmonary Rehab session for April Mueller on 11/30/2014 at 1030 class time.  [name]

## 2014-12-02 ENCOUNTER — Encounter (HOSPITAL_COMMUNITY): Payer: Medicare Other

## 2014-12-06 ENCOUNTER — Telehealth: Payer: Self-pay | Admitting: Emergency Medicine

## 2014-12-06 NOTE — Telephone Encounter (Signed)
Per referral 11/19/14: Note    DME: [facility]   Please set up patient for a 02 conserving device titration study to make sure pt still qualifies for POC. Thanks!    ---  Called pt and made aware of what referral was placed for. Nothing further needed

## 2014-12-07 ENCOUNTER — Encounter (HOSPITAL_COMMUNITY): Payer: Medicare Other

## 2014-12-07 ENCOUNTER — Telehealth (HOSPITAL_COMMUNITY): Payer: Self-pay | Admitting: Family Medicine

## 2014-12-09 ENCOUNTER — Encounter (HOSPITAL_COMMUNITY): Payer: Medicare Other

## 2014-12-09 ENCOUNTER — Telehealth (HOSPITAL_COMMUNITY): Payer: Self-pay | Admitting: Family Medicine

## 2014-12-14 ENCOUNTER — Encounter (HOSPITAL_COMMUNITY): Payer: Medicare Other

## 2014-12-14 ENCOUNTER — Telehealth (HOSPITAL_COMMUNITY): Payer: Self-pay

## 2014-12-14 NOTE — Telephone Encounter (Signed)
Called patient due to absence from Pulmonary Rehab.  Patient states that she has not been feeling well lately and today she has developed some diarrhea.  Patient was reminded not to come back to Rehab until she has been symptom free for 48 hours. Patient states that she thinks she will feel better to attend on Thursday.

## 2014-12-16 ENCOUNTER — Encounter (HOSPITAL_COMMUNITY)
Admission: RE | Admit: 2014-12-16 | Discharge: 2014-12-16 | Disposition: A | Discharge status: Home / Self Care | Payer: Medicare Other | Source: Ambulatory Visit | Attending: Internal Medicine | Admitting: Internal Medicine

## 2014-12-16 DIAGNOSIS — Z9981 Dependence on supplemental oxygen: Secondary | ICD-10-CM | POA: Diagnosis not present

## 2014-12-16 DIAGNOSIS — I272 Other secondary pulmonary hypertension: Secondary | ICD-10-CM | POA: Insufficient documentation

## 2014-12-16 DIAGNOSIS — I5030 Unspecified diastolic (congestive) heart failure: Secondary | ICD-10-CM | POA: Diagnosis not present

## 2014-12-16 NOTE — Progress Notes (Signed)
Today, April Mueller exercised at [employer]. Service time was from 10:30AM to 12:30PM.  The patient exercised by performing aerobic, strengthening, and stretching exercises. Oxygen saturation, heart rate, blood pressure, rate of perceived exertion, and shortness of breath were all monitored before, during, and after exercise. April Mueller presented with no problems at today's exercise session.The patient attended Pursed Lip and Diaphragmatic Breathing exercise today.  The patient did not have an increase in workload intensity during today's exercise session.  Pre-exercise vitals: . Weight kg: 102.2 . Liters of O2: 3 . SpO2: 99 . HR: 58 . BP: 112/56 . CBG: na  Exercise vitals: . Highest heartrate:  65 . Lowest oxygen saturation: 96 . Highest blood pressure: 126/72 . Liters of 02: 4  Post-exercise vitals: . SpO2: 98 . HR: 61 . BP: 122/66 . Liters of O2: 4 . CBG: na  Dr. Brand Males, Medical Director Dr. Wendee Beavers is immediately available during today's Pulmonary Rehab session for April Mueller on 12/16/14 at 10:30am class time.

## 2014-12-16 NOTE — Progress Notes (Signed)
Pulmonary Rehab Note April Mueller has been absent for  2 weeks from exercise in pulmonary rehab.  She returned today and is still " not feeling well".  No specific complaints except for that she has no energy and anything is a chore for her.   Her weight has been stable at 101-102 kg since she has been in the program.  Vital signs are stable @ 112/86 heart rate 58, spo2 99% on 3 liters.   I called Dr. Clayborne Dana office to get her an appointment, but they thought a pulmonary appointment is more appropriate.  She now has an appointment to see Dr. Gwenette Greet Friday, Dec 17, 2014 @ 3 pm.  I rolled her in a wheelchair to be picked up by the [facility] bus.  She was in no acute distress.

## 2014-12-17 ENCOUNTER — Ambulatory Visit (INDEPENDENT_AMBULATORY_CARE_PROVIDER_SITE_OTHER): Payer: Medicare Other | Admitting: Internal Medicine

## 2014-12-17 ENCOUNTER — Encounter: Payer: Self-pay | Admitting: Internal Medicine

## 2014-12-17 ENCOUNTER — Ambulatory Visit (INDEPENDENT_AMBULATORY_CARE_PROVIDER_SITE_OTHER)
Admission: RE | Admit: 2014-12-17 | Discharge: 2014-12-17 | Disposition: A | Discharge status: Home / Self Care | Payer: Medicare Other | Source: Ambulatory Visit | Attending: Internal Medicine | Admitting: Internal Medicine

## 2014-12-17 ENCOUNTER — Other Ambulatory Visit (INDEPENDENT_AMBULATORY_CARE_PROVIDER_SITE_OTHER): Payer: Medicare Other

## 2014-12-17 ENCOUNTER — Ambulatory Visit (INDEPENDENT_AMBULATORY_CARE_PROVIDER_SITE_OTHER): Payer: Medicare Other | Admitting: Pulmonary Disease

## 2014-12-17 VITALS — BP 90/60 | HR 58 | Ht 64.0 in | Wt 224.4 lb

## 2014-12-17 DIAGNOSIS — R0600 Dyspnea, unspecified: Secondary | ICD-10-CM

## 2014-12-17 DIAGNOSIS — J9611 Chronic respiratory failure with hypoxia: Secondary | ICD-10-CM | POA: Diagnosis not present

## 2014-12-17 LAB — CBC WITH DIFFERENTIAL/PLATELET
Basophils Absolute: 0 10*3/uL (ref 0.0–0.1)
Basophils Relative: 0.5 % (ref 0.0–3.0)
EOS ABS: 0 10*3/uL (ref 0.0–0.7)
Eosinophils Relative: 0.7 % (ref 0.0–5.0)
HCT: 32.4 % — ABNORMAL LOW (ref 36.0–46.0)
HEMOGLOBIN: 10.4 g/dL — AB (ref 12.0–15.0)
LYMPHS ABS: 1.2 10*3/uL (ref 0.7–4.0)
Lymphocytes Relative: 21.5 % (ref 12.0–46.0)
MCHC: 32.3 g/dL (ref 30.0–36.0)
MCV: 94 fl (ref 78.0–100.0)
MONOS PCT: 6.5 % (ref 3.0–12.0)
Monocytes Absolute: 0.4 10*3/uL (ref 0.1–1.0)
NEUTROS PCT: 70.8 % (ref 43.0–77.0)
Neutro Abs: 4 10*3/uL (ref 1.4–7.7)
PLATELETS: 120 10*3/uL — AB (ref 150.0–400.0)
RBC: 3.44 Mil/uL — ABNORMAL LOW (ref 3.87–5.11)
RDW: 16.3 % — AB (ref 11.5–15.5)
WBC: 5.7 10*3/uL (ref 4.0–10.5)

## 2014-12-17 LAB — BASIC METABOLIC PANEL
BUN: 40 mg/dL — ABNORMAL HIGH (ref 6–23)
CO2: 31 meq/L (ref 19–32)
Calcium: 10.5 mg/dL (ref 8.4–10.5)
Chloride: 103 mEq/L (ref 96–112)
Creatinine, Ser: 1.76 mg/dL — ABNORMAL HIGH (ref 0.40–1.20)
GFR: 37.04 mL/min — ABNORMAL LOW (ref >60.00)
Glucose, Bld: 98 mg/dL (ref 70–99)
Potassium: 3.7 mEq/L (ref 3.5–5.1)
SODIUM: 137 meq/L (ref 135–145)

## 2014-12-17 LAB — BRAIN NATRIURETIC PEPTIDE: Pro B Natriuretic peptide (BNP): 447 pg/mL — ABNORMAL HIGH (ref 0.0–100.0)

## 2014-12-17 LAB — TSH: TSH: 1.94 u[IU]/mL (ref 0.35–4.50)

## 2014-12-17 MED ORDER — SPIRONOLACTONE 25 MG PO TABS: 25.0000 mg | ORAL_TABLET | Freq: Every day | ORAL | Status: DC

## 2014-12-17 NOTE — Patient Instructions (Signed)
Add aldactone 25 mg to take with your daily furosemide until you see DR Aundra Dubin or Tempie Hoist which should be within a couple of weeks  Please remember to go to the lab and x-ray department downstairs for your tests - we will call you with the results when they are available.

## 2014-12-17 NOTE — Progress Notes (Signed)
Subjective:    Patient ID: April Mueller, female    DOB: 17-Aug-1945,    MRN: [id number]  HPI 69 yo, former smoker (28 pk-yrs), hx of HTN, OSA on CPAP, Rheumatoid arthritis on plaquenil. Hx AAA 2005 c/b ARF and 3 months of HD (now off). 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 She moved back to [place] from [place] in 8/13, is referred by Dr Melinda Crutch for [facility] that has been managed on bosentan 125mg  bid + sildenifil 20mg  tid, supplemental O2 4L/min rest, up to 5L/min exertion.   She reports a worsening in her dyspnea over the last 3 months. No CP. No wheeze or cough. She is able to exert some, able to walk when she takes her time. Her nose is congested. She had epistaxis in Jan, went to [place]. Has recurred some, not currently.   She has had PFT, CT scan chest, R heart cath, echocardiograms in [place] > reviewed 12/11/12   ROV 12/11/12 -- f/u for COPD, RA, OSA and secondary PAH. 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 She is planning to see Dr Trudie Reed for Rheum, hasn't done so yet. She has been clinically stable. Using lasix 40 daily, increased zaroxolyn on her own to about 3 x a week. Has been doing this for a week.   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 S/p TTE  As below > PASP 56, L atrial enlargement w mild to mod MR.   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 Here for f/u. On bosentan, stopped sildenifil and she didn't miss it. I asked her to see Dr Haroldine Laws regarding her moderate MR - no indication for MVR (and she is a poor candidate). 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 Last time we discussed stopping tracleer, also added Anoro to see if she would benefit. She did not benefit from the anoro. The  tracleer was tapered down in discussion with Dr Haroldine Laws, but she did not tolerate > had increased SOB, decreased functional capacity and restarted. LFT normal on 11/26. She had V/q scan 11/28 >> low prob study. Some subjective wt gain, ? How much. Will order wheeled walker through [place].   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 She is on O2, bosentan, CPAP. She is on metolazone and lasix, stable doses. Edema has been stable. She did not benefit from inhaled BD's. Last TTE was 5/'14. She is on 4L/min at all times. She has good compliance with CPAP.   ROV 03/30/14 -- follows for secondary PAH due to severe obstructive disease, RA, OSA. She has not noticed benefit from BD's despite her PFT.  Her V/q was low prob, her CT scan did not show ILD. She is on Tracleer, last LFT were done by Dr Trudie Reed in 8/'15. She is feeling stable, wearing O2 and CPAP  ROV 04/30/14 -- follow up visit for multifactorial secondary PAH. She is currently on 5L/min pulsed O2. She completed 6 minute walk 04/14/14 > 430ft, desats on 5L/min pulsed. We've done the paperwork for macitentan, should be able to start next week. Will stop tracleer when  it is available. If she still has dyspnea then would favor a trial of inhaled prostacyclin. She is compliant w CPAP. Needs LFT today.   ROV 06/02/14 -- follow up for secondary PAH on therapy; hx of OSA, COPD, RA. We have evaluated for VTE and ILD, both negative. We have been changing from bosentan to macitentan > currently on . She was titrated to 3L/min continuous via oximizer w exertion. She is requesting a HH eval. She is having more dyspnea since stopping bosentan.  She has ben on the new medication x 1 month. She has not had a new R heart cath yet. She is not on BD's > she states they have never helped her (although she has obstructive disease by spiro).   ROV 08/04/14 -- follow up visit, hx of  secondary PAH on therapy; hx of OSA, COPD, RA. She is  now on macetentan x about 3 months, she hasn't noticed any interval change, in fact she still believes that she is worse than she was on bosentan. She hasn';t had R heart cath yet. She is hypertensive today. Has gained 2 lbs.   ROV 11/12/14 -- follow-up visit for severe secondary primary hypertension due to COPD, rheumatoid arthritis, and obstructive sleep apnea, most recently on macetentan now for 6 months. She underwent right heart catheterization in January '16.  Showed moderate secondary PAH, with normal PVR, . Suspect she does . She is on 6L/min at rest, adjusts for exercise at pulm rehab. She is scared to stop the macetentan. Her lasix was increased slightly.  rec Please continue your Opsumit for now. We may decide to consider stopping this medication in the future depending on how you are doing Please continue your oxygen and she had been using it Continue your pulmonary rehabilitation   12/17/2014 acute  ov/April Mueller re: valvular heart dz/ chronic resp failure with minimal airflow obst Chief Complaint  Patient presents with  . Follow-up    Pt c/o increased SOB for the past 2 wks. She has also noticed swelling in her legs x 2 wks.    Onset was insidious/ pattern is minimally progressive but no longer able to particpate in rehab and says doe x 50 ft but refused to do walking sats for [place]  No obvious day to day or daytime variabilty or assoc chronic cough or cp or chest tightness, subjective wheeze overt sinus or hb symptoms. No unusual exp hx or h/o childhood pna/ asthma or knowledge of premature birth.  Sleeping ok without nocturnal  or early am exacerbation  of respiratory  c/o's or need for noct saba. Also denies any obvious fluctuation of symptoms with weather or environmental changes or other aggravating or alleviating factors except as outlined above   Current Medications, Allergies, Complete Past Medical History, Past Surgical History, Family History, and Social History were reviewed in  [employer] record.  ROS  The following are not active complaints unless bolded sore throat, dysphagia, dental problems, itching, sneezing,  nasal congestion or excess/ purulent secretions, ear ache,   fever, chills, sweats, unintended wt loss, pleuritic or exertional cp, hemoptysis,  orthopnea pnd or leg swelling, presyncope, palpitations, heartburn, abdominal pain, anorexia, nausea, vomiting, diarrhea  or change in bowel or urinary habits, change in stools or urine, dysuria,hematuria,  rash, arthralgias, visual complaints, headache, numbness weakness or ataxia or problems with walking or coordination,  change in mood/affect or memory.                08/18/14 --  Findings:  RA = 14 RV = 62/3/15 PA = 63/21 (38) PCW = mean 23 v waves to 40 Fick cardiac output/index = 6.3/3.2 Thermodilution CO/CI = 6.1/3.1 PVR = 2.0 WU FA sat = 91% PA sat = 57%, 62%  Assessment: 1. Mild to moderate pulmonary HTN with normal PVR which suggests pulmonary venous HTN 2. Moderately elevated left-sided pressures in the setting of her not taking lasix this am 3. Normal cardiac output      Objective:   Physical Exam   Wt Readings from Last 3 Encounters:  12/17/14 224 lb 6.4 oz (101.787 kg)  11/12/14 222 lb (100.699 kg)  08/04/14 229 lb (103.874 kg)    Vital signs reviewed    Gen: Pleasant, well-nourished, in no distress,  normal affect  ENT: No lesions,  mouth clear,  oropharynx clear, no postnasal drip  Neck: No JVD, no TMG, no carotid bruits  Lungs: No use of accessory muscles, distant, clear without rales or rhonchi  Cardiovascular: RRR, heart sounds normal, no murmur or gallops,    Musculoskeletal: No deformities, no cyanosis or clubbing,  2+ bilateral low ext pitting  edema  Neuro: alert, non focal  Skin: Warm, no lesions or rashes   Study Conclusions 12/17/12 --  - Left ventricle: The cavity size was normal. Wall thickness was normal. Systolic function  was normal. The estimated ejection fraction was in the range of 55% to 60%. Wall motion was normal; there were no regional wall motion abnormalities. - Mitral valve: MR is eccentric and directed posteriorly somewhat hard to judge extent Mildly calcified annulus. Mildly thickened leaflets . Mild to moderate regurgitation. - Left atrium: The atrium was mildly dilated. - Right ventricle: The cavity size was mildly dilated. - Right atrium: The atrium was mildly dilated. - Atrial septum: The septum bowed from left to right, consistent with increased left atrial pressure. - Pulmonary arteries: PA peak pressure: 56mm Hg (S).    I personally reviewed images and agree with radiology impression as follows:  CXR:  12/17/14 1. No convincing acute cardiopulmonary disease. 2. Stable cardiomegaly. Stable areas of lung scarring. Stable interstitial prominence and central vascular congestion. Findings supports COPD. No change from the prior exam.  Labs ordered/ reviewed:   Lab 12/17/14 1612  NA 137  K 3.7  CL 103  CO2 31  BUN 40*  CREATININE 1.76*  GLUCOSE 98    Recent Labs Lab 12/17/14 1612  HGB 10.4*  HCT 32.4*  WBC 5.7  PLT 120.0*     Lab Results  Component Value Date   TSH 1.94 12/17/2014     Lab Results  Component Value Date   PROBNP 447.0* 12/17/2014             Assessment & Plan:

## 2014-12-18 ENCOUNTER — Encounter: Payer: Self-pay | Admitting: Internal Medicine

## 2014-12-18 NOTE — Assessment & Plan Note (Signed)
Adequate control on present rx, reviewed > no change in rx needed  For now

## 2014-12-18 NOTE — Assessment & Plan Note (Addendum)
PFTs  07/03/13 2014  FEV1  0.80 (41%) ratio 73    I had an extended discussion with the patient reviewing all relevant studies completed to date and  Lasting 35 minutes of a 45 minute visit on the following ongoing concerns:   1) there is very little evidence of significant airflow obst  Here (and does not actually  meet the criteria for copd though may have small airways dz)  -  she has failed previous bronchodilator trials so I didn't rec adding any more at this point but if I were going to add anything here it would be a LAMA trial.   2) there is no evidence at all to support [place] and her very expensive and ineffective PAH drug should be d/c'd but she is reluctant to stop it based on what she was told by her [place] pulmonary doctor "I don't trust [place] doctors"  (I tried to gain her confidence several different ways but finally had to resort to telling her I went to school in [place] and this seemed to help more than anything else but she still declined to stop macitentan).  3) she clearly has valvular heart dz and not sure why she needs to be on such high doses of toprol for this and is on a diuretic regimen that does not include aldactone in a refractory edematous state, but with her renal insuff and relatively low bp from high dose BB reluctant to push this but added aldactone 25 mg daily with f/u by Dr Marigene Ehlers rec w/in 2 weeks and will ask him for his input in meantime.

## 2014-12-20 NOTE — Progress Notes (Signed)
Quick Note:  LMTCB ______ 

## 2014-12-20 NOTE — Progress Notes (Signed)
Quick Note:  Spoke with pt and notified of results per Dr. Wert. Pt verbalized understanding and denied any questions.  ______ 

## 2014-12-21 ENCOUNTER — Encounter (HOSPITAL_COMMUNITY): Admission: RE | Admit: 2014-12-21 | Payer: Medicare Other | Source: Ambulatory Visit

## 2014-12-23 ENCOUNTER — Encounter (HOSPITAL_COMMUNITY): Admission: RE | Admit: 2014-12-23 | Payer: Medicare Other | Source: Ambulatory Visit

## 2014-12-28 ENCOUNTER — Encounter (HOSPITAL_COMMUNITY): Admission: RE | Admit: 2014-12-28 | Payer: Medicare Other | Source: Ambulatory Visit

## 2014-12-29 ENCOUNTER — Encounter (HOSPITAL_COMMUNITY): Payer: Self-pay | Admitting: Cardiology

## 2014-12-29 ENCOUNTER — Other Ambulatory Visit (HOSPITAL_COMMUNITY): Payer: Self-pay | Admitting: Respiratory Therapy

## 2014-12-29 ENCOUNTER — Ambulatory Visit (HOSPITAL_COMMUNITY)
Admission: RE | Admit: 2014-12-29 | Discharge: 2014-12-29 | Disposition: A | Discharge status: Home / Self Care | Payer: Medicare Other | Source: Ambulatory Visit | Attending: Cardiology | Admitting: Cardiology

## 2014-12-29 VITALS — BP 130/80 | HR 67 | Wt 218.8 lb

## 2014-12-29 DIAGNOSIS — J9610 Chronic respiratory failure, unspecified whether with hypoxia or hypercapnia: Secondary | ICD-10-CM | POA: Diagnosis not present

## 2014-12-29 DIAGNOSIS — J449 Chronic obstructive pulmonary disease, unspecified: Secondary | ICD-10-CM

## 2014-12-29 DIAGNOSIS — I272 Other secondary pulmonary hypertension: Secondary | ICD-10-CM | POA: Diagnosis not present

## 2014-12-29 DIAGNOSIS — Z79899 Other long term (current) drug therapy: Secondary | ICD-10-CM | POA: Insufficient documentation

## 2014-12-29 DIAGNOSIS — IMO0002 Reserved for concepts with insufficient information to code with codable children: Secondary | ICD-10-CM

## 2014-12-29 DIAGNOSIS — J9611 Chronic respiratory failure with hypoxia: Secondary | ICD-10-CM | POA: Diagnosis not present

## 2014-12-29 DIAGNOSIS — I129 Hypertensive chronic kidney disease with stage 1 through stage 4 chronic kidney disease, or unspecified chronic kidney disease: Secondary | ICD-10-CM | POA: Diagnosis not present

## 2014-12-29 DIAGNOSIS — N189 Chronic kidney disease, unspecified: Secondary | ICD-10-CM | POA: Insufficient documentation

## 2014-12-29 DIAGNOSIS — I5032 Chronic diastolic (congestive) heart failure: Secondary | ICD-10-CM

## 2014-12-29 DIAGNOSIS — Z7982 Long term (current) use of aspirin: Secondary | ICD-10-CM | POA: Diagnosis not present

## 2014-12-29 DIAGNOSIS — G4733 Obstructive sleep apnea (adult) (pediatric): Secondary | ICD-10-CM | POA: Diagnosis not present

## 2014-12-29 DIAGNOSIS — Z87891 Personal history of nicotine dependence: Secondary | ICD-10-CM | POA: Insufficient documentation

## 2014-12-29 DIAGNOSIS — I5022 Chronic systolic (congestive) heart failure: Secondary | ICD-10-CM | POA: Diagnosis not present

## 2014-12-29 DIAGNOSIS — I340 Nonrheumatic mitral (valve) insufficiency: Secondary | ICD-10-CM | POA: Diagnosis not present

## 2014-12-29 MED ORDER — FUROSEMIDE 40 MG PO TABS: 40.0000 mg | ORAL_TABLET | Freq: Two times a day (BID) | ORAL | Status: DC

## 2014-12-29 MED ORDER — POTASSIUM CHLORIDE ER 20 MEQ PO TBCR: 40.0000 meq | EXTENDED_RELEASE_TABLET | Freq: Every day | ORAL | Status: DC

## 2014-12-29 MED ORDER — SPIRONOLACTONE 25 MG PO TABS: 25.0000 mg | ORAL_TABLET | Freq: Every day | ORAL | Status: DC

## 2014-12-29 NOTE — Progress Notes (Signed)
Patient ID: April Mueller, female   DOB: 02/26/1946, 69 y.o.   MRN: [id number]   HPI  PCP: April Mueller Pulmonary: April Mueller/April Melvyn Mueller   April Mueller is a 69 yo with following PMHx  1) COPD       --former smoker (28 pk-yrs)       --on home O2 - 4L/min rest, up to 5L/min exertion.        --spirometry 6/13 in [place] = 0.85L FVC = 1.3L no DLCO measured. FEV1 in 2014 = 0.73        --CT chest 10/14 - moderate centrilobular emphysema. No pulm fibrosis but there was evidence for post-infectious/inflammatory scarring. Stable Asc Ao repair       --PFTs (11/14) FVC 46%, FEV1 0.8L (40%), ratio 87%, FEF 25-75% 0.44L DLCO 22%, TLC 50% => PFTs in 06/2013 did NOT suggest significant airflow obstruction according to April Mueller last note despite "moderate emphysema" on CT.  2) Obesity 3) RA       --on plaquenil, sees April April Mueller.  4) Ascending aortic aneurysm s/p repair 2005     --c/b renal failure requiring HD x 3 months 5) PAH - previously treated in [place].  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      --on macitentan (revatio stopped 10/14).     --V/Q scan 11/14 no chronic PE     -- 2015 traclear stopped and started on macitentan 04/2015.  6) OSA 7) Diastolic CHF    --ECHO 9/62 EF 55-60% RV mildly dilated with normal function. Mild to moderate posterior MR. RVSP 56. Probable restrictive diastolic filling pattern    --ECHO 9/15 EF 60-65% RV mildly dilated with normal function. Mild to moderate posterior MR. RVSP 40. AoRoot ok 8) CKD 9) RHC 08/18/2014 No role for pulmonary vasodilators.  RA = 14 RV = 62/3/15 PA = 63/21 (38) PCW = mean 23 v waves to 40 Fick cardiac output/index = 6.3/3.2 Thermodilution CO/CI = 6.1/3.1, PVR = 2.0 WU, FA sat = 91% PA sat = 57%, 62%  Moved back from [place] and saw April. Lamonte Mueller. In [place] was followed by cardiology and pulmonary. Started on Tracleer in 2006. Revatio was added, however was discontinued without any change in her symptoms. Stopped smoking in January 2005 when she had aortic aneurysm repair. Smoked  1.5-2 ppd x 15 - 20 years.  Now on macitentan.  Follow up: She remains on 6 L home oxygen and CPAP at night.  She is short of breath walking 75-100 feet, with vacuuming, and with bathing.  Overall, feels like breathing is gradually worsening.  No lightheadedness or syncope.  No chest pain.  Weight is down 8 lbs since last appointment.  She is now on spironolactone and is not using metolazone.  She had been doing pulmonary rehab but feels like she is getting too short of breath to continue.      Labs 02/05/13 K 3.8 Creatinine 1.37 05/08/13 K 3.8, Creatinine 1.58 11/14 K 4.1, creatinine 1.82 08/09/2014: K 4.0 Creatinine 1.45  5/16: K 3.7, creatinine 1.76, BNP 447  Past Medical History  Diagnosis Date  . Hypertension   . Pulmonary HTN   . Aortic aneurysm   . Kidney disorder   . Sleep apnea     wears CPAP   Current Outpatient Prescriptions on File Prior to Encounter  Medication Sig Dispense Refill  . allopurinol (ZYLOPRIM) 300 MG tablet Take 300 mg by mouth daily.    [name] ALPRAZolam (XANAX) 0.25 MG tablet Take 0.25 mg by  mouth as needed for sleep.    [name] aspirin 81 MG tablet Take 81 mg by mouth daily.    [name] atorvastatin (LIPITOR) 10 MG tablet Take 1 tablet (10 mg total) by mouth daily. 90 tablet 3  . Azilsartan Medoxomil (EDARBI) 80 MG TABS Take 80 mg by mouth daily.     [name] b complex vitamins tablet Take 1 tablet by mouth daily.    . ferrous gluconate (FERGON) 325 MG tablet Take 325 mg by mouth daily with breakfast.    . hydroxychloroquine (PLAQUENIL) 200 MG tablet Take 200 mg by mouth 2 (two) times daily.    . Macitentan (OPSUMIT) 10 MG TABS Take 10 mg by mouth daily.    . metoprolol succinate (TOPROL-XL) 100 MG 24 hr tablet Take 100 mg by mouth daily. Take with or immediately following a meal.    . Omega-3 Fatty Acids (FISH OIL) 1200 MG CAPS Take 1,200 mg by mouth daily.    . metolazone (ZAROXOLYN) 2.5 MG tablet Take 1 tablet (2.5 mg total) by mouth 2 (two) times a week. (Patient not taking:  Reported on 12/29/2014) 8 tablet 3   No current facility-administered medications on file prior to encounter.   History   Social History  . Marital Status: Legally Separated    Spouse Name: N/A  . Number of Children: 1  . Years of Education: N/A   Occupational History  . Disability     Office Work   Social History Main Topics  . Smoking status: Former Smoker -- 2.00 packs/day for 14 years    Types: Cigarettes    Quit date: 08/09/2003  . Smokeless tobacco: Never Used  . Alcohol Use: No  . Drug Use: No  . Sexual Activity: Not on file   Other Topics Concern  . Not on file   Social History Narrative   Family History  Problem Relation Age of Onset  . Heart attack Mother   . Cancer Father     prostate  . Diabetes Brother     April Mueller Vitals:   12/29/14 0843  BP: 130/80  Pulse: 67  Weight: 218 lb 12.8 oz (99.247 kg)  SpO2: 90%   Physical Exam Gen: Pleasant, well-nourished, in no distress, normal affect; on 5L O2.  HEENT: normal Neck: JVP 9-10 cm with HJR, Carotids 2+ with no bruit. Lungs: Decreased BS throughout. On 6 liters [place].  Cardiovascular: well healed sternotomy scar  PMI normal. RRR, heart sounds normal, 3/6 HSM LLSB/apex.  Extremities: No deformities, no cyanosis or clubbing, trace bilateral edema  Neuro: alert, non focal. Cranial nerves intact. Moves all 4 extremities without difficulty Skin: Warm, no lesions or rashes  Assessment/Plan: 1. 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  This appeared to be primarily pulmonary venous hypertension.  Interestingly, there were prominent V waves in the PCWP tracing. On exam, she has a prominent MR murmur.  I reviewed her prior echo.  This shows eccentric MR, at least moderate (may not be fully visualized).  I am concerned that significant MR may be playing a role in her CHF.   However, I do not think that it can fully explain her degree of hypoxemia (PCWP not markedly elevated on RHC).  On exam today, she remains volume overloaded despite her weight being down compared to prior appointment. NYHA class IIIb symptoms.  - I will increase her Lasix to 40  mg bid and KCl to 40 daily.  She will need BMET done in 1 week to follow creatinine and K.  - I am going to arrange for a TEE next week to assess the mitral valve.  Given her chronic respiratory failure, she will be at higher risk for sedation so will involve anesthesiology.  We discussed risks/benefits of the procedure and she agrees to have it done.  2. Chronic respiratory failure:  She is on 6 L home oxygen by [place].  I do not think that we can pin all this on pulmonary edema from diastolic dysfunction/MR or primary pulmonary hypertension.  I am concerned that she does have significant underlying lung parenchymal disease.  April Mueller, according to last note, does not think she had a significant degree of obstructive lung disease on last PFTs in 2014.  She did have moderate emphysema (with postinfectious versus inflammatory scarring) on last chest CT.  I think that we ought to repeat her PFTs now, if pulmonary service is agreeable to this.  I am increasing her diuretics so hopefully this will remove any excess "lung stiffness" from pulmonary edema.   3. OSA: Continue CPAP nightly.  4. CKD: Will get BMET in 1 week after increasing Lasix.  5. COPD: See #2 above.  Would like to repeat PFTs.  6. Aortic root aneurysm s/p repair 2005: stable by recent echo 7. Pulmonary hypertension: I am concerned that this is primarily WHO group 2 (pulmonary venous hypertension) and group 3 (PH due to intrinsic lung disease) rather than primarily WHO group 1 PAH.  RHC earlier this year showed low PVR.  I am not sure that macitentan is helping much.  Will get TEE to assess MR and diurese, once we have a firmer idea about what is going on, can address stopping  macitentan.  She has been reticent to stop it in the past.  8. Mitral regurgitation: See #1 above. Will assess with TEE.  If she has severe MR, poor surgical candidate.  Would have to consider percutaneous MV clipping.   Followup in 2 wks.   [name] 12/29/2014

## 2014-12-29 NOTE — Patient Instructions (Signed)
INCREASE Lasix to 40 mg twice a day INCREASE Potassium to 40 MEQ daily  Your physician has recommended that you have a pulmonary function test. Pulmonary Function Tests are a group of tests that measure how well air moves in and out of your lungs.  Your physician has requested that you have a TEE. During a TEE, sound waves are used to create images of your heart. It provides your doctor with information about the size and shape of your heart and how well your heart's chambers and valves are working. In this test, a transducer is attached to the end of a flexible tube that's guided down your throat and into your esophagus (the tube leading from you mouth to your stomach) to get a more detailed image of your heart. You are not awake for the procedure. Please see the instruction sheet given to you today. For further information please visit [email or web address].   Your physician recommends that you schedule a follow-up appointment in: 2 weeks

## 2014-12-30 ENCOUNTER — Encounter (HOSPITAL_COMMUNITY): Admission: RE | Admit: 2014-12-30 | Payer: Medicare Other | Source: Ambulatory Visit

## 2014-12-31 ENCOUNTER — Other Ambulatory Visit (HOSPITAL_COMMUNITY): Payer: Self-pay

## 2014-12-31 ENCOUNTER — Encounter (HOSPITAL_COMMUNITY): Payer: Self-pay | Admitting: Pharmacy Technician

## 2014-12-31 ENCOUNTER — Telehealth (HOSPITAL_COMMUNITY): Payer: Self-pay | Admitting: Cardiology

## 2014-12-31 DIAGNOSIS — I5020 Unspecified systolic (congestive) heart failure: Secondary | ICD-10-CM

## 2014-12-31 NOTE — Telephone Encounter (Signed)
Pt scheduled for TEE on 01/05/2015 with Dr.McLean Cpt code 48185 With pts current insurance-[employer] No pre cert required per web portal

## 2015-01-04 ENCOUNTER — Encounter (HOSPITAL_COMMUNITY): Payer: Medicare Other

## 2015-01-05 ENCOUNTER — Ambulatory Visit (HOSPITAL_COMMUNITY)
Admission: RE | Admit: 2015-01-05 | Discharge: 2015-01-05 | Disposition: A | Discharge status: Home / Self Care | Payer: Medicare Other | Source: Ambulatory Visit | Admitting: Cardiology

## 2015-01-05 ENCOUNTER — Encounter (HOSPITAL_COMMUNITY)
Admission: RE | Disposition: A | Discharge status: Home / Self Care | Payer: Self-pay | Source: Ambulatory Visit | Attending: Cardiology

## 2015-01-05 ENCOUNTER — Ambulatory Visit (HOSPITAL_COMMUNITY)
Admission: RE | Admit: 2015-01-05 | Discharge: 2015-01-05 | Disposition: A | Discharge status: Home / Self Care | Payer: Medicare Other | Source: Ambulatory Visit | Attending: Cardiology | Admitting: Cardiology

## 2015-01-05 ENCOUNTER — Encounter (HOSPITAL_COMMUNITY): Payer: Self-pay

## 2015-01-05 DIAGNOSIS — Z9981 Dependence on supplemental oxygen: Secondary | ICD-10-CM | POA: Diagnosis not present

## 2015-01-05 DIAGNOSIS — I5020 Unspecified systolic (congestive) heart failure: Secondary | ICD-10-CM

## 2015-01-05 DIAGNOSIS — Z87891 Personal history of nicotine dependence: Secondary | ICD-10-CM | POA: Diagnosis not present

## 2015-01-05 DIAGNOSIS — I712 Thoracic aortic aneurysm, without rupture: Secondary | ICD-10-CM | POA: Diagnosis not present

## 2015-01-05 DIAGNOSIS — E669 Obesity, unspecified: Secondary | ICD-10-CM | POA: Diagnosis not present

## 2015-01-05 DIAGNOSIS — I272 Other secondary pulmonary hypertension: Secondary | ICD-10-CM | POA: Insufficient documentation

## 2015-01-05 DIAGNOSIS — I129 Hypertensive chronic kidney disease with stage 1 through stage 4 chronic kidney disease, or unspecified chronic kidney disease: Secondary | ICD-10-CM | POA: Diagnosis not present

## 2015-01-05 DIAGNOSIS — J9610 Chronic respiratory failure, unspecified whether with hypoxia or hypercapnia: Secondary | ICD-10-CM | POA: Diagnosis not present

## 2015-01-05 DIAGNOSIS — J449 Chronic obstructive pulmonary disease, unspecified: Secondary | ICD-10-CM | POA: Diagnosis not present

## 2015-01-05 DIAGNOSIS — I5032 Chronic diastolic (congestive) heart failure: Secondary | ICD-10-CM | POA: Insufficient documentation

## 2015-01-05 DIAGNOSIS — N189 Chronic kidney disease, unspecified: Secondary | ICD-10-CM | POA: Diagnosis not present

## 2015-01-05 DIAGNOSIS — Z7982 Long term (current) use of aspirin: Secondary | ICD-10-CM | POA: Diagnosis not present

## 2015-01-05 DIAGNOSIS — I350 Nonrheumatic aortic (valve) stenosis: Secondary | ICD-10-CM | POA: Diagnosis not present

## 2015-01-05 DIAGNOSIS — G4733 Obstructive sleep apnea (adult) (pediatric): Secondary | ICD-10-CM | POA: Diagnosis not present

## 2015-01-05 DIAGNOSIS — I270 Primary pulmonary hypertension: Secondary | ICD-10-CM | POA: Insufficient documentation

## 2015-01-05 DIAGNOSIS — I340 Nonrheumatic mitral (valve) insufficiency: Secondary | ICD-10-CM | POA: Insufficient documentation

## 2015-01-05 HISTORY — PX: TEE WITHOUT CARDIOVERSION: SHX5443

## 2015-01-05 LAB — BASIC METABOLIC PANEL
Anion gap: 6 (ref 5–15)
BUN: 30 mg/dL — AB (ref 6–20)
CHLORIDE: 109 mmol/L (ref 101–111)
CO2: 27 mmol/L (ref 22–32)
Calcium: 9.7 mg/dL (ref 8.9–10.3)
Creatinine, Ser: 2.09 mg/dL — ABNORMAL HIGH (ref 0.44–1.00)
GFR calc Af Amer: 27 mL/min — ABNORMAL LOW (ref >60)
GFR calc non Af Amer: 23 mL/min — ABNORMAL LOW (ref >60)
Glucose, Bld: 131 mg/dL — ABNORMAL HIGH (ref 65–99)
Potassium: 5 mmol/L (ref 3.5–5.1)
Sodium: 142 mmol/L (ref 135–145)

## 2015-01-05 SURGERY — ECHOCARDIOGRAM, TRANSESOPHAGEAL
Anesthesia: Moderate Sedation

## 2015-01-05 MED ORDER — MIDAZOLAM HCL 5 MG/ML IJ SOLN
INTRAMUSCULAR | Status: AC
  Filled 2015-01-05: qty 2

## 2015-01-05 MED ORDER — FENTANYL CITRATE (PF) 100 MCG/2ML IJ SOLN
INTRAMUSCULAR | Status: AC
  Filled 2015-01-05: qty 2

## 2015-01-05 MED ORDER — FENTANYL CITRATE (PF) 100 MCG/2ML IJ SOLN
INTRAMUSCULAR | Status: DC | PRN
  Administered 2015-01-05 (×3): 25 ug via INTRAVENOUS

## 2015-01-05 MED ORDER — SODIUM CHLORIDE 0.9 % IV SOLN
INTRAVENOUS | Status: DC
Start: 2015-01-05 — End: 2015-01-05
  Administered 2015-01-05: 500 mL via INTRAVENOUS

## 2015-01-05 MED ORDER — MIDAZOLAM HCL 10 MG/2ML IJ SOLN
INTRAMUSCULAR | Status: DC | PRN
Start: 2015-01-05 — End: 2015-01-05
  Administered 2015-01-05: 1 mg via INTRAVENOUS
  Administered 2015-01-05 (×2): 2 mg via INTRAVENOUS

## 2015-01-05 MED ORDER — BUTAMBEN-TETRACAINE-BENZOCAINE 2-2-14 % EX AERO
INHALATION_SPRAY | CUTANEOUS | Status: DC | PRN
  Administered 2015-01-05: 2 via TOPICAL

## 2015-01-05 NOTE — CV Procedure (Signed)
Procedure: TEE  Indication: Mitral regurgitation  Sedation: Versed 5 mg, Fentanyl 75 mcg  Findings: Please see report in echo section.  Normal LV size with normal LV thickness.  EF 55-60%.  Normal wall motion.  The RV was mildly dilated with normal systolic function.  Mild to moderate tricuspid regurgitation with peak RV-RA gradient 57 mmHg.  Trileaflet aortic valve with trivial AI, no aortic stenosis.  The mitral valve appeared structurally normal.  I did not see any prolapse or flail segment, no definite perforation.  There was very eccentric, posteriorly-directed mitral regurgitation.  The MR wrapped around the left atrium.  I did not see pulmonary venous systolic flow reversal on PW doppler.  By PISA, MR was moderate but PISA was difficult given eccentricity of jet.  Moderate to severe mitral regurgitation.  Moderate left atrial enlargement, mild right atrial enlargement.  No LAA thrombus.  The interatrial septum bowed to the right, suggesting LA pressure/volume overload.  Bubble study was weakly positive, possible small PFO.  The ascending aorta appeared to be s/p repair.  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   Impression: 1. Moderate to severe very eccentric mitral regurgitation.  Mechanism difficult to discern.  Will need to review with colleagues regarding benefit of repair (?candidate for percutaneous clip).  2. 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  On discussion with her, sounds like she had dissection repair back in 2005.   April Mueller 01/05/2015 9:05 AM

## 2015-01-05 NOTE — Interval H&P Note (Signed)
History and Physical Interval Note:  01/05/2015 8:17 AM  April Mueller  has presented today for surgery, with the diagnosis of MVR  The various methods of treatment have been discussed with the patient and family. After consideration of risks, benefits and other options for treatment, the patient has consented to  Procedure(s): TRANSESOPHAGEAL ECHOCARDIOGRAM (TEE) (N/A) as a surgical intervention .  The patient's history has been reviewed, patient examined, no change in status, stable for surgery.  I have reviewed the patient's chart and labs.  Questions were answered to the patient's satisfaction.     Dreyton Roessner [employer]

## 2015-01-05 NOTE — H&P (View-Only) (Signed)
Patient ID: April Mueller, female   DOB: 1946/07/26, 69 y.o.   MRN: [id number]   HPI  PCP: Dr Harrington Challenger Pulmonary: Dr Byrum/Dr Melvyn Novas   April Mueller is a 69 yo with following PMHx  1) COPD       --former smoker (28 pk-yrs)       --on home O2 - 4L/min rest, up to 5L/min exertion.        --spirometry 6/13 in [place] = 0.85L FVC = 1.3L no DLCO measured. FEV1 in 2014 = 0.73        --CT chest 10/14 - moderate centrilobular emphysema. No pulm fibrosis but there was evidence for post-infectious/inflammatory scarring. Stable Asc Ao repair       --PFTs (11/14) FVC 46%, FEV1 0.8L (40%), ratio 87%, FEF 25-75% 0.44L DLCO 22%, TLC 50% => PFTs in 06/2013 did NOT suggest significant airflow obstruction according to Dr Gustavus Bryant last note despite "moderate emphysema" on CT.  2) Obesity 3) RA       --on plaquenil, sees Dr Lenna Gilford.  4) Ascending aortic aneurysm s/p repair 2005     --c/b renal failure requiring HD x 3 months 5) PAH - previously treated in [place].  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      --on macitentan (revatio stopped 10/14).     --V/Q scan 11/14 no chronic PE     -- 2015 traclear stopped and started on macitentan 04/2015.  6) OSA 7) Diastolic CHF    --ECHO 1/44 EF 55-60% RV mildly dilated with normal function. Mild to moderate posterior MR. RVSP 56. Probable restrictive diastolic filling pattern    --ECHO 9/15 EF 60-65% RV mildly dilated with normal function. Mild to moderate posterior MR. RVSP 40. AoRoot ok 8) CKD 9) RHC 08/18/2014 No role for pulmonary vasodilators.  RA = 14 RV = 62/3/15 PA = 63/21 (38) PCW = mean 23 v waves to 40 Fick cardiac output/index = 6.3/3.2 Thermodilution CO/CI = 6.1/3.1, PVR = 2.0 WU, FA sat = 91% PA sat = 57%, 62%  Moved back from [place] and saw Dr. Lamonte Sakai. In [place] was followed by cardiology and pulmonary. Started on Tracleer in 2006. Revatio was added, however was discontinued without any change in her symptoms. Stopped smoking in January 2005 when she had aortic aneurysm repair. Smoked  1.5-2 ppd x 15 - 20 years.  Now on macitentan.  Follow up: She remains on 6 L home oxygen and CPAP at night.  She is short of breath walking 75-100 feet, with vacuuming, and with bathing.  Overall, feels like breathing is gradually worsening.  No lightheadedness or syncope.  No chest pain.  Weight is down 8 lbs since last appointment.  She is now on spironolactone and is not using metolazone.  She had been doing pulmonary rehab but feels like she is getting too short of breath to continue.      Labs 02/05/13 K 3.8 Creatinine 1.37 05/08/13 K 3.8, Creatinine 1.58 11/14 K 4.1, creatinine 1.82 08/09/2014: K 4.0 Creatinine 1.45  5/16: K 3.7, creatinine 1.76, BNP 447  Past Medical History  Diagnosis Date  . Hypertension   . Pulmonary HTN   . Aortic aneurysm   . Kidney disorder   . Sleep apnea     wears CPAP   Current Outpatient Prescriptions on File Prior to Encounter  Medication Sig Dispense Refill  . allopurinol (ZYLOPRIM) 300 MG tablet Take 300 mg by mouth daily.    [name] ALPRAZolam (XANAX) 0.25 MG tablet Take 0.25 mg by  mouth as needed for sleep.    [name] aspirin 81 MG tablet Take 81 mg by mouth daily.    [name] atorvastatin (LIPITOR) 10 MG tablet Take 1 tablet (10 mg total) by mouth daily. 90 tablet 3  . Azilsartan Medoxomil (EDARBI) 80 MG TABS Take 80 mg by mouth daily.     [name] b complex vitamins tablet Take 1 tablet by mouth daily.    . ferrous gluconate (FERGON) 325 MG tablet Take 325 mg by mouth daily with breakfast.    . hydroxychloroquine (PLAQUENIL) 200 MG tablet Take 200 mg by mouth 2 (two) times daily.    . Macitentan (OPSUMIT) 10 MG TABS Take 10 mg by mouth daily.    . metoprolol succinate (TOPROL-XL) 100 MG 24 hr tablet Take 100 mg by mouth daily. Take with or immediately following a meal.    . Omega-3 Fatty Acids (FISH OIL) 1200 MG CAPS Take 1,200 mg by mouth daily.    . metolazone (ZAROXOLYN) 2.5 MG tablet Take 1 tablet (2.5 mg total) by mouth 2 (two) times a week. (Patient not taking:  Reported on 12/29/2014) 8 tablet 3   No current facility-administered medications on file prior to encounter.   History   Social History  . Marital Status: Legally Separated    Spouse Name: N/A  . Number of Children: 1  . Years of Education: N/A   Occupational History  . Disability     Office Work   Social History Main Topics  . Smoking status: Former Smoker -- 2.00 packs/day for 14 years    Types: Cigarettes    Quit date: 08/09/2003  . Smokeless tobacco: Never Used  . Alcohol Use: No  . Drug Use: No  . Sexual Activity: Not on file   Other Topics Concern  . Not on file   Social History Narrative   Family History  Problem Relation Age of Onset  . Heart attack Mother   . Cancer Father     prostate  . Diabetes Brother     Danley Danker Vitals:   12/29/14 0843  BP: 130/80  Pulse: 67  Weight: 218 lb 12.8 oz (99.247 kg)  SpO2: 90%   Physical Exam Gen: Pleasant, well-nourished, in no distress, normal affect; on 5L O2.  HEENT: normal Neck: JVP 9-10 cm with HJR, Carotids 2+ with no bruit. Lungs: Decreased BS throughout. On 6 liters [place].  Cardiovascular: well healed sternotomy scar  PMI normal. RRR, heart sounds normal, 3/6 HSM LLSB/apex.  Extremities: No deformities, no cyanosis or clubbing, trace bilateral edema  Neuro: alert, non focal. Cranial nerves intact. Moves all 4 extremities without difficulty Skin: Warm, no lesions or rashes  Assessment/Plan: 1. 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  This appeared to be primarily pulmonary venous hypertension.  Interestingly, there were prominent V waves in the PCWP tracing. On exam, she has a prominent MR murmur.  I reviewed her prior echo.  This shows eccentric MR, at least moderate (may not be fully visualized).  I am concerned that significant MR may be playing a role in her CHF.   However, I do not think that it can fully explain her degree of hypoxemia (PCWP not markedly elevated on RHC).  On exam today, she remains volume overloaded despite her weight being down compared to prior appointment. NYHA class IIIb symptoms.  - I will increase her Lasix to 40  mg bid and KCl to 40 daily.  She will need BMET done in 1 week to follow creatinine and K.  - I am going to arrange for a TEE next week to assess the mitral valve.  Given her chronic respiratory failure, she will be at higher risk for sedation so will involve anesthesiology.  We discussed risks/benefits of the procedure and she agrees to have it done.  2. Chronic respiratory failure:  She is on 6 L home oxygen by [place].  I do not think that we can pin all this on pulmonary edema from diastolic dysfunction/MR or primary pulmonary hypertension.  I am concerned that she does have significant underlying lung parenchymal disease.  Dr Melvyn Novas, according to last note, does not think she had a significant degree of obstructive lung disease on last PFTs in 2014.  She did have moderate emphysema (with postinfectious versus inflammatory scarring) on last chest CT.  I think that we ought to repeat her PFTs now, if pulmonary service is agreeable to this.  I am increasing her diuretics so hopefully this will remove any excess "lung stiffness" from pulmonary edema.   3. OSA: Continue CPAP nightly.  4. CKD: Will get BMET in 1 week after increasing Lasix.  5. COPD: See #2 above.  Would like to repeat PFTs.  6. Aortic root aneurysm s/p repair 2005: stable by recent echo 7. Pulmonary hypertension: I am concerned that this is primarily WHO group 2 (pulmonary venous hypertension) and group 3 (PH due to intrinsic lung disease) rather than primarily WHO group 1 PAH.  RHC earlier this year showed low PVR.  I am not sure that macitentan is helping much.  Will get TEE to assess MR and diurese, once we have a firmer idea about what is going on, can address stopping  macitentan.  She has been reticent to stop it in the past.  8. Mitral regurgitation: See #1 above. Will assess with TEE.  If she has severe MR, poor surgical candidate.  Would have to consider percutaneous MV clipping.   Followup in 2 wks.   [name] 12/29/2014

## 2015-01-05 NOTE — Discharge Instructions (Signed)
Transesophageal Echocardiogram °Transesophageal echocardiography (TEE) is a picture test of your heart using sound waves. The pictures taken can give very detailed pictures of your heart. This can help your doctor see if there are problems with your heart. TEE can check: °· If your heart has blood clots in it. °· How well your heart valves are working. °· If you have an infection on the inside of your heart. °· Some of the major arteries of your heart. °· If your heart valve is working after a repair. °· Your heart before a procedure that uses a shock to your heart to get the rhythm back to normal. °BEFORE THE PROCEDURE °· Do not eat or drink for 6 hours before the procedure or as told by your doctor. °· Make plans to have someone drive you home after the procedure. Do not drive yourself home. °· An IV tube will be put in your arm. °PROCEDURE °· You will be given a medicine to help you relax (sedative). It will be given through the IV tube. °· A numbing medicine will be sprayed or gargled in the back of your throat to help numb it. °· The tip of the probe is placed into the back of your mouth. You will be asked to swallow. This helps to pass the probe into your esophagus. °· Once the tip of the probe is in the right place, your doctor can take pictures of your heart. °· You may feel pressure at the back of your throat. °AFTER THE PROCEDURE °· You will be taken to a recovery area so the sedative can wear off. °· Your throat may be sore and scratchy. This will go away slowly over time. °· You will go home when you are fully awake and able to swallow liquids. °· You should have someone stay with you for the next 24 hours. °· Do not drive or operate machinery for the next 24 hours. °Document Released: 05/20/2009 Document Revised: 07/28/2013 Document Reviewed: 01/22/2013 °ExitCare® Patient Information ©2015 [employer]. This information is not intended to replace advice given to you by your health care provider. Make  sure you discuss any questions you have with your health care provider. ° ° °Conscious Sedation, Adult, Care After °Refer to this sheet in the next few weeks. These instructions provide you with information on caring for yourself after your procedure. Your health care provider may also give you more specific instructions. Your treatment has been planned according to current medical practices, but problems sometimes occur. Call your health care provider if you have any problems or questions after your procedure. °WHAT TO EXPECT AFTER THE PROCEDURE  °After your procedure: °· You may feel sleepy, clumsy, and have poor balance for several hours. °· Vomiting may occur if you eat too soon after the procedure. °HOME CARE INSTRUCTIONS °· Do not participate in any activities where you could become injured for at least 24 hours. Do not: °¨ Drive. °¨ Swim. °¨ Ride a bicycle. °¨ Operate heavy machinery. °¨ Cook. °¨ Use power tools. °¨ Climb ladders. °¨ Work from a high place. °· Do not make important decisions or sign legal documents until you are improved. °· If you vomit, drink water, juice, or soup when you can drink without vomiting. Make sure you have little or no nausea before eating solid foods. °· Only take over-the-counter or prescription medicines for pain, discomfort, or fever as directed by your health care provider. °· Make sure you and your family fully understand everything about   the medicines given to you, including what side effects may occur. °· You should not drink alcohol, take sleeping pills, or take medicines that cause drowsiness for at least 24 hours. °· If you smoke, do not smoke without supervision. °· If you are feeling better, you may resume normal activities 24 hours after you were sedated. °· Keep all appointments with your health care provider. °SEEK MEDICAL CARE IF: °· Your skin is pale or bluish in color. °· You continue to feel nauseous or vomit. °· Your pain is getting worse and is not helped  by medicine. °· You have bleeding or swelling. °· You are still sleepy or feeling clumsy after 24 hours. °SEEK IMMEDIATE MEDICAL CARE IF: °· You develop a rash. °· You have difficulty breathing. °· You develop any type of allergic problem. °· You have a fever. °MAKE SURE YOU: °· Understand these instructions. °· Will watch your condition. °· Will get help right away if you are not doing well or get worse. °Document Released: 05/13/2013 Document Reviewed: 05/13/2013 °ExitCare® Patient Information ©2015 [employer]. This information is not intended to replace advice given to you by your health care provider. Make sure you discuss any questions you have with your health care provider. ° °

## 2015-01-05 NOTE — Progress Notes (Signed)
  Echocardiogram Echocardiogram Transesophageal has been performed.  Gabe Glace FRANCES 01/05/2015, 9:08 AM

## 2015-01-06 ENCOUNTER — Telehealth (HOSPITAL_COMMUNITY): Payer: Self-pay | Admitting: Cardiology

## 2015-01-06 ENCOUNTER — Encounter (HOSPITAL_COMMUNITY): Payer: Medicare Other

## 2015-01-06 ENCOUNTER — Encounter (HOSPITAL_COMMUNITY): Payer: Self-pay | Admitting: Cardiology

## 2015-01-06 DIAGNOSIS — I5022 Chronic systolic (congestive) heart failure: Secondary | ICD-10-CM

## 2015-01-06 MED ORDER — POTASSIUM CHLORIDE ER 20 MEQ PO TBCR: 20.0000 meq | EXTENDED_RELEASE_TABLET | Freq: Every day | ORAL | Status: DC

## 2015-01-06 MED ORDER — AMLODIPINE BESYLATE 5 MG PO TABS: 5.0000 mg | ORAL_TABLET | Freq: Every day | ORAL | Status: DC

## 2015-01-06 NOTE — Telephone Encounter (Signed)
-----   Message from Larey Dresser, MD sent at 01/05/2015  5:56 PM EDT ----- Stop azilsartan April Mueller) and start amlodipine 5 mg daily for BP control instead (because of high creatinine).  Decrease KCl to 20 daily.  BMET in 1 week. Call patient please.

## 2015-01-06 NOTE — Telephone Encounter (Signed)
Pt aware and voiced understanding, will repeat labs at next office visit 6/13 as she has limited transportation [facility]

## 2015-01-11 ENCOUNTER — Encounter (HOSPITAL_COMMUNITY): Payer: Medicare Other

## 2015-01-12 ENCOUNTER — Ambulatory Visit (HOSPITAL_COMMUNITY)
Admission: RE | Admit: 2015-01-12 | Discharge: 2015-01-12 | Disposition: A | Discharge status: Home / Self Care | Payer: Medicare Other | Source: Ambulatory Visit | Attending: Cardiology | Admitting: Cardiology

## 2015-01-12 DIAGNOSIS — J449 Chronic obstructive pulmonary disease, unspecified: Secondary | ICD-10-CM | POA: Diagnosis present

## 2015-01-12 LAB — PULMONARY FUNCTION TEST
DL/VA % PRED: 65 %
DL/VA: 3.13 ml/min/mmHg/L
DLCO UNC % PRED: 19 %
DLCO UNC: 4.84 ml/min/mmHg
FEF 25-75 Post: 0.45 L/sec
FEF 25-75 Pre: 0.36 L/sec
FEF2575-%Change-Post: 23 %
FEF2575-%Pred-Post: 24 %
FEF2575-%Pred-Pre: 20 %
FEV1-%Change-Post: 6 %
FEV1-%Pred-Post: 41 %
FEV1-%Pred-Pre: 38 %
FEV1-Post: 0.79 L
FEV1-Pre: 0.74 L
FEV1FVC-%CHANGE-POST: 6 %
FEV1FVC-%PRED-PRE: 83 %
FEV6-%CHANGE-POST: 0 %
FEV6-%PRED-POST: 48 %
FEV6-%PRED-PRE: 48 %
FEV6-POST: 1.15 L
FEV6-Pre: 1.15 L
FEV6FVC-%CHANGE-POST: 0 %
FEV6FVC-%Pred-Post: 104 %
FEV6FVC-%Pred-Pre: 104 %
FVC-%Change-Post: 0 %
FVC-%Pred-Post: 46 %
FVC-%Pred-Pre: 46 %
FVC-Post: 1.15 L
FVC-Pre: 1.15 L
PRE FEV6/FVC RATIO: 100 %
Post FEV1/FVC ratio: 69 %
Post FEV6/FVC ratio: 100 %
Pre FEV1/FVC ratio: 65 %
RV % PRED: 81 %
RV: 1.73 L
TLC % pred: 59 %
TLC: 3.01 L

## 2015-01-12 MED ORDER — ALBUTEROL SULFATE (2.5 MG/3ML) 0.083% IN NEBU
2.5000 mg | INHALATION_SOLUTION | Freq: Once | RESPIRATORY_TRACT | Status: AC
  Administered 2015-01-12: 2.5 mg via RESPIRATORY_TRACT

## 2015-01-13 ENCOUNTER — Encounter (HOSPITAL_COMMUNITY): Payer: Medicare Other

## 2015-01-17 ENCOUNTER — Encounter (HOSPITAL_COMMUNITY): Payer: Self-pay

## 2015-01-17 ENCOUNTER — Ambulatory Visit (HOSPITAL_COMMUNITY)
Admission: RE | Admit: 2015-01-17 | Discharge: 2015-01-17 | Disposition: A | Discharge status: Home / Self Care | Payer: Medicare Other | Source: Ambulatory Visit | Attending: Internal Medicine | Admitting: Internal Medicine

## 2015-01-17 VITALS — BP 110/68 | HR 100 | Wt 219.2 lb

## 2015-01-17 DIAGNOSIS — IMO0002 Reserved for concepts with insufficient information to code with codable children: Secondary | ICD-10-CM

## 2015-01-17 DIAGNOSIS — I272 Other secondary pulmonary hypertension: Secondary | ICD-10-CM | POA: Diagnosis not present

## 2015-01-17 DIAGNOSIS — I340 Nonrheumatic mitral (valve) insufficiency: Secondary | ICD-10-CM | POA: Diagnosis not present

## 2015-01-17 DIAGNOSIS — I5032 Chronic diastolic (congestive) heart failure: Secondary | ICD-10-CM | POA: Diagnosis present

## 2015-01-17 DIAGNOSIS — Z87891 Personal history of nicotine dependence: Secondary | ICD-10-CM | POA: Insufficient documentation

## 2015-01-17 DIAGNOSIS — J9611 Chronic respiratory failure with hypoxia: Secondary | ICD-10-CM | POA: Insufficient documentation

## 2015-01-17 DIAGNOSIS — G4733 Obstructive sleep apnea (adult) (pediatric): Secondary | ICD-10-CM | POA: Insufficient documentation

## 2015-01-17 LAB — BASIC METABOLIC PANEL
Anion gap: 6 (ref 5–15)
BUN: 49 mg/dL — ABNORMAL HIGH (ref 6–20)
CO2: 26 mmol/L (ref 22–32)
Calcium: 10.4 mg/dL — ABNORMAL HIGH (ref 8.9–10.3)
Chloride: 108 mmol/L (ref 101–111)
Creatinine, Ser: 2.13 mg/dL — ABNORMAL HIGH (ref 0.44–1.00)
GFR calc non Af Amer: 23 mL/min — ABNORMAL LOW (ref >60)
GFR, EST AFRICAN AMERICAN: 26 mL/min — AB (ref >60)
Glucose, Bld: 108 mg/dL — ABNORMAL HIGH (ref 65–99)
POTASSIUM: 4.6 mmol/L (ref 3.5–5.1)
SODIUM: 140 mmol/L (ref 135–145)

## 2015-01-17 LAB — BRAIN NATRIURETIC PEPTIDE: B Natriuretic Peptide: 291.4 pg/mL — ABNORMAL HIGH (ref 0.0–100.0)

## 2015-01-17 NOTE — Progress Notes (Signed)
Patient ID: April Mueller, female   DOB: Apr 09, 1946, 69 y.o.   MRN: [id number]   HPI  PCP: Dr Harrington Challenger Pulmonary: Dr Byrum/Dr Melvyn Novas   April Mueller is a 69 yo with following PMHx  1) COPD       --former smoker (28 pk-yrs)       --on home O2 - 4L/min rest, up to 5L/min exertion.        --spirometry 6/13 in [place] = 0.85L FVC = 1.3L no DLCO measured. FEV1 in 2014 = 0.73        --CT chest 10/14 - moderate centrilobular emphysema. No pulm fibrosis but there was evidence for post-infectious/inflammatory scarring. Stable Asc Ao repair       --PFTs (11/14) FVC 46%, FEV1 0.8L (40%), ratio 87%, FEF 25-75% 0.44L DLCO 22%, TLC 50% => PFTs in 06/2013 did NOT suggest significant airflow obstruction according to Dr Gustavus Bryant last note despite "moderate emphysema" on CT.        --PFTs (6/16) were very abnormal with severe obstruction, severe restriction, severely decreased DLCO.  2) Obesity 3) RA       --on plaquenil, sees Dr Lenna Gilford.  4) Type A aortic dissection s/p repair 2005     --c/b renal failure requiring HD x 3 months 5) PAH - previously treated in [place].  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      --on macitentan (revatio stopped 10/14).     --V/Q scan 11/14 no chronic PE     -- 2015 traclear stopped and started on macitentan 04/2015.  6) OSA 7) Diastolic CHF    --ECHO 0/63 EF 55-60% RV mildly dilated with normal function. Mild to moderate posterior MR. RVSP 56. Probable restrictive diastolic filling pattern    --ECHO 9/15 EF 60-65% RV mildly dilated with normal function. Mild to moderate posterior MR. RVSP 40. Ao Root ok    --TEE 6/16 with EF 55-60%, D-shaped interventricular septum suggestive of RV pressure/volume overload, RV mildly dilated with normal systolic function, peak RV-RA gradient 57 mmHg, very eccentric posteriorly-directed mitral regurgitation, possibly severe, etiology may be a very small area of prolapse on the anterior leaflet, s/p ascending aorta repair with residual dissection flap in the descending  thoracic aorta.  8) CKD 9) RHC 08/18/2014: No role for pulmonary vasodilators.  RA = 14 RV = 62/3/15 PA = 63/21 (38) PCW = mean 23 v waves to 40 Fick cardiac output/index = 6.3/3.2 Thermodilution CO/CI = 6.1/3.1, PVR = 2.0 WU, FA sat = 91% PA sat = 57%, 62% 10) Mitral regurgitation: Possibly severe by 6/16 TEE, very eccentric.   Moved back from [place] and saw Dr. Lamonte Sakai. In [place] was followed by cardiology and pulmonary. Started on Tracleer in 2006. Revatio was added, however was discontinued without any change in her symptoms. Stopped smoking in January 2005 when she had aortic aneurysm repair. Smoked 1.5-2 ppd x 15 - 20 years.  Now on macitentan.  At last appointment, Lasix was increased to 40 mg bid.  She was taken off ARB with CKD and started on amlodipine.  Weight is stable.   Follow up: She remains on 6 L home oxygen and CPAP at night.  She is doing better on increased Lasix.  She is able to walk 75-100 yards before stopping to rest.  Still dyspneic with vacuuming and with bathing.  No lightheadedness or syncope.  No chest pain.  I had her do a TEE after last appointment.  She had a loud MR murmur.  TEE showed very  eccentric, posteriorly-directed MR that may be from subtle prolapse of the anterior mitral leaflet.  I am concerned that the MR is severe.  She additionally had PFTs done that were very abnormal.  PFTs showed severe obstruction, severe restriction, and severely decreased DLCO.     Labs 02/05/13 K 3.8 Creatinine 1.37 05/08/13 K 3.8, Creatinine 1.58 11/14 K 4.1, creatinine 1.82 08/09/2014: K 4.0 Creatinine 1.45  5/16: K 3.7, creatinine 1.76, BNP 447 6/16: K 5, creatinine 2.09  Past Medical History  Diagnosis Date  . Hypertension   . Pulmonary HTN   . Aortic aneurysm   . Sleep apnea     wears CPAP  . Kidney disorder    Current Outpatient Prescriptions on File Prior to Encounter  Medication Sig Dispense Refill  . allopurinol (ZYLOPRIM) 300 MG tablet Take 300 mg by mouth daily.    [name]  ALPRAZolam (XANAX) 0.25 MG tablet Take 0.25 mg by mouth as needed for sleep.    [name] amLODipine (NORVASC) 5 MG tablet Take 1 tablet (5 mg total) by mouth daily. 90 tablet 3  . aspirin 81 MG tablet Take 81 mg by mouth daily.    [name] atorvastatin (LIPITOR) 10 MG tablet Take 1 tablet (10 mg total) by mouth daily. 90 tablet 3  . b complex vitamins tablet Take 1 tablet by mouth daily.    . ferrous sulfate (SLOW FE) 160 (50 FE) MG TBCR SR tablet Take 160 mg by mouth daily.    [name] FLUOCINOLONE ACETONIDE BODY 0.01 % OIL Apply 1 mL topically 2 (two) times daily as needed (for flares).   3  . furosemide (LASIX) 40 MG tablet Take 1 tablet (40 mg total) by mouth 2 (two) times daily. 180 tablet 3  . hydroxychloroquine (PLAQUENIL) 200 MG tablet Take 200 mg by mouth 2 (two) times daily.    [name] ketoconazole (NIZORAL) 2 % shampoo Apply 1 application topically 2 (two) times a week. Leave on 5 minutes before rinsing out  3  . Macitentan (OPSUMIT) 10 MG TABS Take 10 mg by mouth daily.    . metoprolol succinate (TOPROL-XL) 100 MG 24 hr tablet Take 100 mg by mouth 2 (two) times daily.     . Omega-3 Fatty Acids (FISH OIL) 1200 MG CAPS Take 1,200 mg by mouth daily.    . Potassium Chloride ER 20 MEQ TBCR Take 20 mEq by mouth daily. 108 tablet 3  . spironolactone (ALDACTONE) 25 MG tablet Take 1 tablet (25 mg total) by mouth daily. 90 tablet 3   No current facility-administered medications on file prior to encounter.   History   Social History  . Marital Status: Legally Separated    Spouse Name: N/A  . Number of Children: 1  . Years of Education: N/A   Occupational History  . Disability     Office Work   Social History Main Topics  . Smoking status: Former Smoker -- 2.00 packs/day for 14 years    Types: Cigarettes    Quit date: 08/09/2003  . Smokeless tobacco: Never Used  . Alcohol Use: No  . Drug Use: No  . Sexual Activity: Not on file   Other Topics Concern  . None   Social History Narrative   Family  History  Problem Relation Age of Onset  . Heart attack Mother   . Cancer Father     prostate  . Diabetes Brother     Danley Danker Vitals:   01/17/15 1107  BP: 110/68  Pulse: 100  Weight: 219 lb 4 oz (99.451 kg)  SpO2: 92%   Physical Exam Gen: Pleasant, well-nourished, in no distress, normal affect; on 5L O2.  HEENT: normal Neck: JVP 8-9 cm, Carotids 2+ with no bruit. Lungs: Decreased BS throughout. On 6 liters [place].  Cardiovascular: well healed sternotomy scar  PMI normal. RRR, heart sounds normal, 3/6 HSM apex.  Extremities: No deformities, no cyanosis or clubbing, trace bilateral edema  Neuro: alert, non focal. Cranial nerves intact. Moves all 4 extremities without difficulty Skin: Warm, no lesions or rashes  Assessment/Plan: 1. 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  This appeared to be primarily pulmonary venous hypertension.  Interestingly, there were prominent V waves in the PCWP tracing. On exam, she has a prominent MR murmur.  I am concerned that significant MR may be playing a role in her CHF. I did a TEE in 6/16 showing very eccentric, posteriorly-directed mitral regurgitation possibly from subtle anterior leaflet prolapse.  I think that the MR may be severe. However, I do not think that it can fully explain her degree of hypoxemia.  She is feeling better with increased Lasix but still NYHA class III symptoms.  Volume status looks improved but still some overload.  - Continue current Lasix.  Breathing is better and do not want to increase Lasix with rise in creatinine.  She will need repeat BMET/BNP today.   2. Chronic respiratory failure:  She is on 6 L home oxygen by [place].  I do not think that we can pin all this on pulmonary edema from diastolic dysfunction/MR.  I am concerned that she does have significant underlying lung parenchymal  disease.  Dr Melvyn Novas, according to last note, did not think she had a significant degree of obstructive lung disease on last PFTs in 2014.  However, PFTs done in 6/16 were markedly abnormal with severe obstruction, severe restriction, and severely decreased DLCO.  She did have moderate emphysema (with postinfectious versus inflammatory scarring) on last chest CT.  - I am going to try to move up her pulmonary appointment.  I would like her evaluated again by pulmonary prior to any possible mitral valve intervention.  3. OSA: Continue CPAP nightly.  4. CKD: BMET today.   5. COPD: See #2 above.   6. Aortic dissection s/p repair 2005: Although complete records are not available, it appears that the patient developed a Type A dissection in 2005 with ascending aorta repair (can see the surgically repaired ascending aorta well on TEE).  She has residual dissection in the descending thoracic aorta.  7. Pulmonary hypertension: I am concerned that this is primarily WHO group 2 (pulmonary venous hypertension) and group 3 (PH due to intrinsic lung disease) rather than primarily WHO group 1 PAH.  RHC earlier this year showed low PVR.  I am not sure that macitentan is helping much.  We will need to talk about stopping macitentan in the future.  She has been reticent to stop it in the past.  8. Mitral regurgitation:  TEE concerning for severe, eccentric mitral regurgitation, possibly due to subtle prolapse of the anterior mitral valve leaflet.  I suspect that the MR is playing a role in her CHF. She had prominent V-waves on RHC earlier this year, and she has a loud mitral area murmur.  Her lung disease makes her a poor candidate for mitral valve surgery.  I am going to  have her evaluated at [facility] for possible percutaneous mitral clipping.  Will send the TEE films down for evaluation.   Folllowup in 1 month.   [name] 01/17/2015

## 2015-01-17 NOTE — Patient Instructions (Signed)
LABS today (bmet bnp)  FOLLOW UP in 1 month.

## 2015-01-17 NOTE — Progress Notes (Signed)
Advanced Heart Failure Medication Review by a Pharmacist  Does the patient  feel that his/her medications are working for him/her?  yes  Has the patient been experiencing any side effects to the medications prescribed?  no  Does the patient measure his/her own blood pressure or blood glucose at home?  no   Does the patient have any problems obtaining medications due to transportation or finances?   no  Understanding of regimen: fair Understanding of indications: fair Potential of compliance: good    Pharmacist comments: Patient presents to heart failure clinic and medications were reviewed with a pharmacist. No medication discrepancies noted at this time, patient does not complain of any side effects. Does need a medication list to help with recall of her regimen.   Megan E. Supple, Pharm.D Clinical Pharmacy Resident Pager: [phone] 01/17/2015 11:18 AM

## 2015-01-18 NOTE — Addendum Note (Signed)
Encounter addended by: Benedetto Goad, RN on: 01/18/2015  9:04 AM<BR>     Documentation filed: Notes Section

## 2015-01-18 NOTE — Progress Notes (Signed)
Pulmonary Rehab Discharge Note: April Mueller has been discharged from pulmonary rehab per her request. She is has been experiencing sudden onset of increased shortness of breath and is being worked up by the [facility] for etiology. She completed 15 exercise and education session and initially had great improvement in her SOB, stamina, and strength and was tolerating work load increases. She also stated she was able to complete her ADLs with better ease. With this new onset of increased SOB her attendance and ability to exercise ceased. This has caused a tremendous setback in her stamina and strength and her ability to complete her ADLS. She as been encouraged to do as much exercising as possible at home while being worked up for her SOB.

## 2015-01-20 ENCOUNTER — Encounter: Payer: Self-pay | Admitting: Internal Medicine

## 2015-01-20 ENCOUNTER — Ambulatory Visit (INDEPENDENT_AMBULATORY_CARE_PROVIDER_SITE_OTHER): Payer: Medicare Other | Admitting: Internal Medicine

## 2015-01-20 VITALS — BP 132/70 | HR 74 | Ht 64.0 in | Wt 219.8 lb

## 2015-01-20 DIAGNOSIS — J9611 Chronic respiratory failure with hypoxia: Secondary | ICD-10-CM

## 2015-01-20 DIAGNOSIS — J449 Chronic obstructive pulmonary disease, unspecified: Secondary | ICD-10-CM

## 2015-01-20 NOTE — Patient Instructions (Signed)
There is no reason based on your lung function that you can't have surgery and no need for any pulmonary medications at this point   We can see you post op either at the hospital or at the office if your breathing or oxygen requirement is not better after surgery

## 2015-01-20 NOTE — Progress Notes (Addendum)
Subjective:    Patient ID: April Mueller, female    DOB: [birth year],    MRN: [id number]  HPI 69 yo, former smoker (28 pk-yrs), hx of HTN, OSA on CPAP, Rheumatoid arthritis on plaquenil. Hx AAA 2005 c/b ARF and 3 months of HD (now off). 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 She moved back to [place] from [place] in 8/13, is referred by Dr Melinda Crutch for [facility] that has been managed on bosentan 125mg  bid + sildenifil 20mg  tid, supplemental O2 4L/min rest, up to 5L/min exertion.   She reports a worsening in her dyspnea over the last 3 months. No CP. No wheeze or cough. She is able to exert some, able to walk when she takes her time. Her nose is congested. She had epistaxis in Jan, went to [place]. Has recurred some, not currently.   She has had PFT, CT scan chest, R heart cath, echocardiograms in [place] > reviewed 12/11/12   ROV 12/11/12 -- f/u for COPD, RA, OSA and secondary PAH. 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 She is planning to see Dr Trudie Reed for Rheum, hasn't done so yet. She has been clinically stable. Using lasix 40 daily, increased zaroxolyn on her own to about 3 x a week. Has been doing this for a week.   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 S/p TTE  As below > PASP 56, L atrial enlargement w mild to mod MR.   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 Here for f/u. On bosentan, stopped sildenifil and she didn't miss it. I asked her to see Dr Haroldine Laws regarding her moderate MR - no indication for MVR (and she is a poor candidate). 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 Last time we discussed stopping tracleer, also added Anoro to see if she would benefit. She did not benefit from the anoro. The  tracleer was tapered down in discussion with Dr Haroldine Laws, but she did not tolerate > had increased SOB, decreased functional capacity and restarted. LFT normal on 11/26. She had V/q scan 11/28 >> low prob study. Some subjective wt gain, ? How much. Will order wheeled walker through [place].   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 She is on O2, bosentan, CPAP. She is on metolazone and lasix, stable doses. Edema has been stable. She did not benefit from inhaled BD's. Last TTE was 5/'14. She is on 4L/min at all times. She has good compliance with CPAP.   ROV 03/30/14 -- follows for secondary PAH due to severe obstructive disease, RA, OSA. She has not noticed benefit from BD's despite her PFT.  Her V/q was low prob, her CT scan did not show ILD. She is on Tracleer, last LFT were done by Dr Trudie Reed in 8/'15. She is feeling stable, wearing O2 and CPAP  ROV 04/30/14 -- follow up visit for multifactorial secondary PAH. She is currently on 5L/min pulsed O2. She completed 6 minute walk 04/14/14 > 433ft, desats on 5L/min pulsed. We've done the paperwork for macitentan, should be able to start next week. Will stop tracleer when  it is available. If she still has dyspnea then would favor a trial of inhaled prostacyclin. She is compliant w CPAP. Needs LFT today.   ROV 06/02/14 -- follow up for secondary PAH on therapy; hx of OSA, COPD, RA. We have evaluated for VTE and ILD, both negative. We have been changing from bosentan to macitentan > currently on . She was titrated to 3L/min continuous via oximizer w exertion. She is requesting a HH eval. She is having more dyspnea since stopping bosentan.  She has ben on the new medication x 1 month. She has not had a new R heart cath yet. She is not on BD's > she states they have never helped her (although she has obstructive disease by spiro).   ROV 08/04/14 -- follow up visit, hx of  secondary PAH on therapy; hx of OSA, COPD, RA. She is  now on macetentan x about 3 months, she hasn't noticed any interval change, in fact she still believes that she is worse than she was on bosentan. She hasn';t had R heart cath yet. She is hypertensive today. Has gained 2 lbs.   ROV 11/12/14 -- follow-up visit for severe secondary primary hypertension due to COPD, rheumatoid arthritis, and obstructive sleep apnea, most recently on macetentan now for 6 months. She underwent right heart catheterization in January '16.  Showed moderate secondary PAH, with normal PVR, . Suspect she does . She is on 6L/min at rest, adjusts for exercise at pulm rehab. She is scared to stop the macetentan. Her lasix was increased slightly.  rec Please continue your Opsumit for now. We may decide to consider stopping this medication in the future depending on how you are doing Please continue your oxygen and she had been using it Continue your pulmonary rehabilitation   12/17/2014 acute  ov/Graylon Amory re: valvular heart dz/ chronic resp failure with minimal airflow obst Chief Complaint  Patient presents with  . Follow-up    Pt c/o increased SOB for the past 2 wks. She has also noticed swelling in her legs x 2 wks.   Onset was insidious/ pattern is minimally progressive but no longer able to particpate in rehab and says doe x 50 ft but refused to do walking sats for [place] rec Add aldactone 25 mg to take with your daily furosemide until you see DR Aundra Dubin or Tempie Hoist which should be within a couple of weeks     01/20/2015 f/u ov/Lil Lepage re: chronic resp failure / 02 3lpm sitting/sleeping/ and 6lpm walking  Chief Complaint  Patient presents with  . Follow-up    Pt states needing surgical clearnace for upcoming heart surgery. Pt c/o SOB . Denies cough, chest pain or tightness, wheezing or fever    Continues with doe x > "50-100 ft " but note walked up the hill at the building today on 02 s stopping   No obvious day to day or daytime variabilty or assoc chronic cough or cp or chest  tightness, subjective wheeze overt sinus or hb symptoms. No unusual exp hx or h/o childhood pna/ asthma or knowledge of premature birth.  Sleeping ok without nocturnal  or early am exacerbation  of respiratory  c/o's or need for noct saba. Also denies any obvious fluctuation of symptoms with weather or environmental changes or other aggravating or alleviating factors except as outlined above   Current Medications, Allergies, Complete Past Medical History, Past Surgical History, Family History, and Social History were reviewed in [employer] record.  ROS  The following are not active complaints unless bolded sore throat, dysphagia, dental problems, itching, sneezing,  nasal congestion or excess/ purulent secretions, ear ache,   fever, chills, sweats, unintended wt loss, pleuritic or exertional cp, hemoptysis,  orthopnea pnd or leg swelling, presyncope, palpitations, heartburn, abdominal pain, anorexia, nausea, vomiting, diarrhea  or change in bowel or urinary habits, change in stools or urine, dysuria,hematuria,  rash, arthralgias, visual complaints, headache, numbness weakness or ataxia or problems with walking or coordination,  change in mood/affect or memory.                08/18/14 --  Findings:  RA = 14 RV = 62/3/15 PA = 63/21 (38) PCW = mean 23 v waves to 40 Fick cardiac output/index = 6.3/3.2 Thermodilution CO/CI = 6.1/3.1 PVR = 2.0 WU FA sat = 91% PA sat = 57%, 62%  Assessment: 1. Mild to moderate pulmonary HTN with normal PVR which suggests pulmonary venous HTN 2. Moderately elevated left-sided pressures in the setting of her not taking lasix this am 3. Normal cardiac output      Objective:   Physical Exam  01/20/2015        220  Wt Readings from Last 3 Encounters:  12/17/14 224 lb 6.4 oz (101.787 kg)  11/12/14 222 lb (100.699 kg)  08/04/14 229 lb (103.874 kg)    Vital signs reviewed    Gen: Pleasant, well-nourished, in no distress,   normal affect  ENT: No lesions,  mouth clear,  oropharynx clear, no postnasal drip  Neck: No JVD, no TMG, no carotid bruits  Lungs: No use of accessory muscles, distant, clear without rales or rhonchi  Cardiovascular: RRR, heart sounds normal, no murmur or gallops,    Musculoskeletal: No deformities, no cyanosis or clubbing,  Trace  bilateral low ext pitting  edema  Neuro: alert, non focal  Skin: Warm, no lesions or rashes   Study Conclusions 12/17/12 --  - Left ventricle: The cavity size was normal. Wall thickness was normal. Systolic function was normal. The estimated ejection fraction was in the range of 55% to 60%. Wall motion was normal; there were no regional wall motion abnormalities. - Mitral valve: MR is eccentric and directed posteriorly somewhat hard to judge extent Mildly calcified annulus. Mildly thickened leaflets . Mild to moderate regurgitation. - Left atrium: The atrium was mildly dilated. - Right ventricle: The cavity size was mildly dilated. - Right atrium: The atrium was mildly dilated. - Atrial septum: The septum bowed from left to right, consistent with increased left atrial pressure. - Pulmonary arteries: PA peak pressure: 84mm Hg (S).    I personally reviewed images and agree with radiology impression as follows:  CXR:  12/17/14 1. No convincing acute cardiopulmonary disease. 2. Stable cardiomegaly. Stable areas of lung scarring. Stable interstitial prominence and central vascular congestion. Findings supports COPD. No change from the prior exam.  Labs ordered/ reviewed:   Lab 12/17/14 1612  NA 137  K 3.7  CL 103  CO2 31  BUN 40*  CREATININE 1.76*  GLUCOSE 98    Recent Labs Lab 12/17/14 1612  HGB 10.4*  HCT 32.4*  WBC 5.7  PLT 120.0*     Lab Results  Component Value Date   TSH 1.94 12/17/2014     Lab Results  Component Value Date   PROBNP 447.0* 12/17/2014             Assessment & Plan:

## 2015-01-22 ENCOUNTER — Encounter: Payer: Self-pay | Admitting: Internal Medicine

## 2015-01-22 NOTE — Assessment & Plan Note (Signed)
PFTs  01/12/15  FEV1 0.79 (41%) ratio 60 and dlco 19 corrects to 65%   I had an extended discussion with the patient reviewing all relevant studies completed to date and  lasting 15 to 20 minutes of a 25 minute visit on the following ongoing concerns:  1) she barely meet the criteria for copd - the reason her studies are read this way is that she also has a severe restrictive element due to the space her chest occupies in her thorax plus obesity with  Body mass index is 37.71 kg/(m^2).   2) she has no cough or AB component to "tune up" but might  benefit from perioperative duoneb and certainly early mobilization /min opioids and perhaps a bipap bridge but on the basis of her lung dz per se it is unlikely she will become vent dependent  3) I do worry about her overall reserve and well her LV will hold up to full afterload (as she is unloading into the LA now) but leave that to cards and surgery to make that call.   4) Each maintenance medication was reviewed in detail including most importantly the difference between maintenance and as needed and under what circumstances the prns are to be used.  Please see instructions for details which were reviewed in writing and the patient given a copy.

## 2015-01-22 NOTE — Assessment & Plan Note (Signed)
The low dlco is a puzzle as she has a very high LA pressure meaning the lung should be more congested with a higher diffusing capacity but this is chronic and easily overcome with supplemental 02

## 2015-01-22 NOTE — Assessment & Plan Note (Signed)
Body mass index is 37.71 kg/(m^2).  Lab Results  Component Value Date   TSH 1.94 12/17/2014     Explains some of her restrictive changes on pfts > reviewed neg cal balance > f/u primary  care

## 2015-02-15 ENCOUNTER — Ambulatory Visit: Payer: Medicare Other | Admitting: Emergency Medicine

## 2015-02-16 ENCOUNTER — Ambulatory Visit (HOSPITAL_COMMUNITY)
Admission: RE | Admit: 2015-02-16 | Discharge: 2015-02-16 | Disposition: A | Discharge status: Home / Self Care | Payer: Medicare Other | Source: Ambulatory Visit | Attending: Cardiology | Admitting: Cardiology

## 2015-02-16 VITALS — BP 106/64 | HR 67 | Wt 214.4 lb

## 2015-02-16 DIAGNOSIS — J9611 Chronic respiratory failure with hypoxia: Secondary | ICD-10-CM | POA: Diagnosis not present

## 2015-02-16 DIAGNOSIS — I272 Other secondary pulmonary hypertension: Secondary | ICD-10-CM

## 2015-02-16 DIAGNOSIS — Z87891 Personal history of nicotine dependence: Secondary | ICD-10-CM | POA: Insufficient documentation

## 2015-02-16 DIAGNOSIS — Z7982 Long term (current) use of aspirin: Secondary | ICD-10-CM | POA: Diagnosis not present

## 2015-02-16 DIAGNOSIS — Z79899 Other long term (current) drug therapy: Secondary | ICD-10-CM | POA: Insufficient documentation

## 2015-02-16 DIAGNOSIS — Z833 Family history of diabetes mellitus: Secondary | ICD-10-CM | POA: Insufficient documentation

## 2015-02-16 DIAGNOSIS — I129 Hypertensive chronic kidney disease with stage 1 through stage 4 chronic kidney disease, or unspecified chronic kidney disease: Secondary | ICD-10-CM | POA: Insufficient documentation

## 2015-02-16 DIAGNOSIS — I5032 Chronic diastolic (congestive) heart failure: Secondary | ICD-10-CM

## 2015-02-16 DIAGNOSIS — Z8249 Family history of ischemic heart disease and other diseases of the circulatory system: Secondary | ICD-10-CM | POA: Insufficient documentation

## 2015-02-16 DIAGNOSIS — E669 Obesity, unspecified: Secondary | ICD-10-CM | POA: Diagnosis not present

## 2015-02-16 DIAGNOSIS — J9610 Chronic respiratory failure, unspecified whether with hypoxia or hypercapnia: Secondary | ICD-10-CM | POA: Insufficient documentation

## 2015-02-16 DIAGNOSIS — G4733 Obstructive sleep apnea (adult) (pediatric): Secondary | ICD-10-CM

## 2015-02-16 DIAGNOSIS — J449 Chronic obstructive pulmonary disease, unspecified: Secondary | ICD-10-CM | POA: Diagnosis not present

## 2015-02-16 DIAGNOSIS — M069 Rheumatoid arthritis, unspecified: Secondary | ICD-10-CM | POA: Diagnosis not present

## 2015-02-16 DIAGNOSIS — IMO0002 Reserved for concepts with insufficient information to code with codable children: Secondary | ICD-10-CM

## 2015-02-16 DIAGNOSIS — I340 Nonrheumatic mitral (valve) insufficiency: Secondary | ICD-10-CM

## 2015-02-16 DIAGNOSIS — Z9981 Dependence on supplemental oxygen: Secondary | ICD-10-CM | POA: Diagnosis not present

## 2015-02-16 DIAGNOSIS — N189 Chronic kidney disease, unspecified: Secondary | ICD-10-CM | POA: Insufficient documentation

## 2015-02-16 DIAGNOSIS — Z9989 Dependence on other enabling machines and devices: Secondary | ICD-10-CM

## 2015-02-16 LAB — BASIC METABOLIC PANEL
ANION GAP: 7 (ref 5–15)
BUN: 50 mg/dL — AB (ref 6–20)
CALCIUM: 9.8 mg/dL (ref 8.9–10.3)
CO2: 27 mmol/L (ref 22–32)
CREATININE: 2.63 mg/dL — AB (ref 0.44–1.00)
Chloride: 106 mmol/L (ref 101–111)
GFR calc Af Amer: 21 mL/min — ABNORMAL LOW (ref >60)
GFR calc non Af Amer: 18 mL/min — ABNORMAL LOW (ref >60)
GLUCOSE: 111 mg/dL — AB (ref 65–99)
Potassium: 4.9 mmol/L (ref 3.5–5.1)
SODIUM: 140 mmol/L (ref 135–145)

## 2015-02-16 LAB — LIPID PANEL
CHOLESTEROL: 112 mg/dL (ref 0–200)
HDL: 52 mg/dL (ref >40)
LDL Cholesterol: 54 mg/dL (ref 0–99)
Total CHOL/HDL Ratio: 2.2 RATIO
Triglycerides: 31 mg/dL (ref <150)
VLDL: 6 mg/dL (ref 0–40)

## 2015-02-16 NOTE — Progress Notes (Signed)
Patient ID: April Mueller, female   DOB: 1945-08-26, 69 y.o.   MRN: [id number]   HPI  PCP: Dr April Mueller Pulmonary: Dr April Mueller/Dr April Novas   [name] is a 69 yo with following PMHx  1) COPD       --former smoker (28 pk-yrs)       --on home O2 - 4L/min rest, up to 5L/min exertion.        --spirometry 6/13 in [place] = 0.85L FVC = 1.3L no DLCO measured. FEV1 in 2014 = 0.73        --CT chest 10/14 - moderate centrilobular emphysema. No pulm fibrosis but there was evidence for post-infectious/inflammatory scarring. Stable Asc Ao repair       --PFTs (11/14) FVC 46%, FEV1 0.8L (40%), ratio 87%, FEF 25-75% 0.44L DLCO 22%, TLC 50% => PFTs in 06/2013 did NOT suggest significant airflow obstruction according to Dr April Mueller last note despite "moderate emphysema" on CT.        --PFTs (6/16) were abnormal with severe obstruction, severe restriction, severely decreased DLCO.  Reviewed with Dr April Mueller => he thinks obstruction is actually fairly minimal and that the restriction is due to body habitus.   2) Obesity 3) RA       --on plaquenil, sees Dr April Mueller.  4) Type A aortic dissection s/p repair 2005     --c/b renal failure requiring HD x 3 months 5) PAH - previously treated in [place].  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      --on macitentan (revatio stopped 10/14).     --V/Q scan 11/14 no chronic PE     -- 2015 Tracleer stopped and started on macitentan 04/2015.  6) OSA 7) Diastolic CHF    --ECHO 1/06 EF 55-60% RV mildly dilated with normal function. Mild to moderate posterior MR. RVSP 56. Probable restrictive diastolic filling pattern    --ECHO 9/15 EF 60-65% RV mildly dilated with normal function. Mild to moderate posterior MR. RVSP 40. Ao Root ok    --TEE 6/16 with EF 55-60%, D-shaped interventricular septum suggestive of RV pressure/volume overload, RV mildly dilated with normal systolic function, peak RV-RA gradient 57 mmHg, very eccentric posteriorly-directed mitral regurgitation, possibly severe, etiology may be a very small  area of prolapse on the anterior leaflet, s/p ascending aorta repair with residual dissection flap in the descending thoracic aorta.  8) CKD 9) RHC 08/18/2014: No role for pulmonary vasodilators.  RA = 14 RV = 62/3/15 PA = 63/21 (38) PCW = mean 23 v waves to 40 Fick cardiac output/index = 6.3/3.2 Thermodilution CO/CI = 6.1/3.1, PVR = 2.0 WU, FA sat = 91% PA sat = 57%, 62% 10) Mitral regurgitation: Possibly severe by 6/16 TEE, very eccentric.   Moved back from [place] and saw Dr. Lamonte Mueller. In [place] was followed by cardiology and pulmonary. Started on Tracleer in 2006. Revatio was added, however was discontinued without any change in her symptoms. Stopped smoking in January 2005 when she had aortic aneurysm repair. Smoked 1.5-2 ppd x 15 - 20 years.  Now on macitentan.  Lasix has been increased to 40 mg bid.  She was taken off ARB with CKD and started on amlodipine.    Follow up: She remains on 6 L home oxygen and CPAP at night.  She is doing better on increased Lasix.  She is down 6 lbs compared to prior appointment.  She is able to walk 75-100 yards before stopping to rest.  Still dyspneic with vacuuming and with bathing.  No lightheadedness  or syncope.  No chest pain.    I had her do a TEE in 6/16.  She had a loud MR murmur.  TEE showed very eccentric, posteriorly-directed MR that may be from subtle prolapse of the anterior mitral leaflet.  I am concerned that the MR could be severe but very difficult to fully visualize.  She additionally had PFTs done that were abnormal, but according to her pulmonologist April Mueller), the obstruction was really only minimal and the restriction was primarily related to body habitus.  He does not think that we can explain her hypoxemia and dyspnea primarily from parenchymal lung pathology.    Labs 02/05/13 K 3.8 Creatinine 1.37 05/08/13 K 3.8, Creatinine 1.58 11/14 K 4.1, creatinine 1.82 08/09/2014: K 4.0 Creatinine 1.45  5/16: K 3.7, creatinine 1.76, BNP 447 6/16: K 5 => 4.6,  creatinine 2.09 => 2.13, BNP 291  Past Medical History  Diagnosis Date  . Hypertension   . Pulmonary HTN   . Aortic aneurysm   . Sleep apnea     wears CPAP  . Kidney disorder    Current Outpatient Prescriptions on File Prior to Encounter  Medication Sig Dispense Refill  . allopurinol (ZYLOPRIM) 300 MG tablet Take 300 mg by mouth daily.    [name] ALPRAZolam (XANAX) 0.25 MG tablet Take 0.25 mg by mouth as needed for sleep.    [name] amLODipine (NORVASC) 5 MG tablet Take 1 tablet (5 mg total) by mouth daily. 90 tablet 3  . aspirin 81 MG tablet Take 81 mg by mouth daily.    [name] atorvastatin (LIPITOR) 10 MG tablet Take 1 tablet (10 mg total) by mouth daily. 90 tablet 3  . b complex vitamins tablet Take 1 tablet by mouth daily.    . ferrous sulfate (SLOW FE) 160 (50 FE) MG TBCR SR tablet Take 160 mg by mouth daily.    [name] FLUOCINOLONE ACETONIDE BODY 0.01 % OIL Apply 1 mL topically 2 (two) times daily as needed (for flares).   3  . furosemide (LASIX) 40 MG tablet Take 1 tablet (40 mg total) by mouth 2 (two) times daily. 180 tablet 3  . hydroxychloroquine (PLAQUENIL) 200 MG tablet Take 200 mg by mouth 2 (two) times daily.    [name] ketoconazole (NIZORAL) 2 % shampoo Apply 1 application topically 2 (two) times a week. Leave on 5 minutes before rinsing out  3  . Macitentan (OPSUMIT) 10 MG TABS Take 10 mg by mouth daily.    . metoprolol succinate (TOPROL-XL) 100 MG 24 hr tablet Take 100 mg by mouth 2 (two) times daily.     . Omega-3 Fatty Acids (FISH OIL) 1200 MG CAPS Take 1,200 mg by mouth daily.    . Potassium Chloride ER 20 MEQ TBCR Take 20 mEq by mouth daily. 108 tablet 3  . spironolactone (ALDACTONE) 25 MG tablet Take 1 tablet (25 mg total) by mouth daily. 90 tablet 3   No current facility-administered medications on file prior to encounter.   History   Social History  . Marital Status: Legally Separated    Spouse Name: N/A  . Number of Children: 1  . Years of Education: N/A   Occupational History  .  Disability     Office Work   Social History Main Topics  . Smoking status: Former Smoker -- 2.00 packs/day for 14 years    Types: Cigarettes    Quit date: 08/09/2003  . Smokeless tobacco: Never Used  . Alcohol Use: No  . Drug Use:  No  . Sexual Activity: Not on file   Other Topics Concern  . Not on file   Social History Narrative   Family History  Problem Relation Age of Onset  . Heart attack Mother   . Cancer Father     prostate  . Diabetes Brother     Danley Danker Vitals:   02/16/15 1016  BP: 106/64  Pulse: 67  Weight: 214 lb 6.4 oz (97.251 kg)  SpO2: 97%   Physical Exam Gen: Pleasant, well-nourished, in no distress, normal affect; on O2 by [place].  HEENT: normal Neck: JVP 8-9 cm, Carotids 2+ with no bruit. Lungs: Decreased BS throughout. On 6 liters [employer].  Cardiovascular: well healed sternotomy scar  PMI normal. RRR, heart sounds normal, 3/6 HSM apex.  Extremities: No deformities, no cyanosis or clubbing, trace bilateral edema  Neuro: alert, non focal. Cranial nerves intact. Moves all 4 extremities without difficulty Skin: Warm, no lesions or rashes  Assessment/Plan: 1. 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  This appeared to be primarily pulmonary venous hypertension.  Interestingly, there were very prominent V waves in the PCWP tracing. On exam, she has a prominent MR murmur.  I am concerned that significant MR may be playing a major role in her symptomatology. I did a TEE in 6/16 showing very eccentric, posteriorly-directed mitral regurgitation possibly from subtle anterior leaflet prolapse.  I think that the MR could be severe but is difficult to fully visualize.  Her pulmonologist does not think that lung parenchymal disease adequately explains her degree of hypoxemia and dyspnea. She is feeling significantly better with increased  Lasix but still NYHA class III symptoms.  Volume status looks improved.  - Continue current Lasix.  Breathing is better and do not want to increase Lasix with some rise in creatinine.  She will need repeat BMET/BNP today.  Weight continues to come down.  2. Chronic respiratory failure:  She is on 6 L home oxygen by [place].  I have been concerned that she does have significant underlying lung parenchymal disease.  However, her pulmonologist (Dr April Mueller), according to last note, did not think she had a significant degree of obstructive lung disease on last PFTs done this year.  He thought that the restriction was primarily due to her body habitus.  He thought that CHF was the primary driver for her dyspnea and that she should be stable from a pulmonary standpoint for mitral valve clip if she were to qualify.   3. OSA: Continue CPAP nightly.  4. CKD: BMET today.   5. COPD: See #2 above.   6. Aortic dissection s/p repair 2005: Although complete records are not available, it appears that the patient developed a Type A dissection in 2005 with ascending aorta repair (can see the surgically repaired ascending aorta well on TEE).  She has residual dissection in the descending thoracic aorta.  7. Pulmonary hypertension: I am concerned that this is primarily WHO group 2 (pulmonary venous hypertension) and group 3 (PH due to intrinsic lung disease) rather than primarily WHO group 1 PAH.  RHC earlier this year showed low PVR.  I am not sure that macitentan is helping much.  She has been reticent to stop it when this has been discussed.  8. Mitral regurgitation:  TEE concerning for possibly severe, eccentric mitral regurgitation, most likely due to subtle prolapse of the anterior mitral valve leaflet.  I am concerned that the MR is playing a role in her CHF. She had prominent V-waves on RHC earlier this year, and she has a loud mitral area murmur.  I do not think that she is a good candidate for open MV surgery. I am going to  have her evaluated at [facility] for possible percutaneous mitral clipping (they should be contacting her this week).   [place] in 2 months.   [name] 02/16/2015

## 2015-02-16 NOTE — Patient Instructions (Signed)
Labs today  We will follow up on your referral to [place], if you do not hear from them in 1 week please give our office a call so we can look further into this.  Your physician recommends that you schedule a follow-up appointment in: 2 months  Do the following things EVERYDAY: 1) Weigh yourself in the morning before breakfast. Write it down and keep it in a log. 2) Take your medicines as prescribed 3) Eat low salt foods-Limit salt (sodium) to 2000 mg per day.  4) Stay as active as you can everyday 5) Limit all fluids for the day to less than 2 liters 6)

## 2015-02-17 ENCOUNTER — Telehealth (HOSPITAL_COMMUNITY): Payer: Self-pay | Admitting: Cardiology

## 2015-02-17 DIAGNOSIS — I5022 Chronic systolic (congestive) heart failure: Secondary | ICD-10-CM

## 2015-02-17 MED ORDER — FUROSEMIDE 40 MG PO TABS: ORAL_TABLET | ORAL | Status: DC

## 2015-02-17 NOTE — Telephone Encounter (Signed)
Pt aware and voiced understanding Repeat labs on 7/27 @ 1145

## 2015-02-17 NOTE — Telephone Encounter (Signed)
-----   Message from Larey Dresser, MD sent at 02/17/2015 12:43 AM EDT ----- Creatinine is up more.  Would have her decrease Lasix to 40 qam, 20 qpm.  She will need to cut back as much as possible on sodium intake.  Repeat BMET in 10 days.

## 2015-03-02 ENCOUNTER — Ambulatory Visit (HOSPITAL_COMMUNITY)
Admission: RE | Admit: 2015-03-02 | Discharge: 2015-03-02 | Disposition: A | Discharge status: Home / Self Care | Payer: Medicare Other | Source: Ambulatory Visit | Attending: Cardiology | Admitting: Cardiology

## 2015-03-02 ENCOUNTER — Other Ambulatory Visit: Payer: Self-pay | Admitting: *Deleted

## 2015-03-02 DIAGNOSIS — J9611 Chronic respiratory failure with hypoxia: Secondary | ICD-10-CM

## 2015-03-02 DIAGNOSIS — I5022 Chronic systolic (congestive) heart failure: Secondary | ICD-10-CM | POA: Diagnosis not present

## 2015-03-02 MED ORDER — MACITENTAN 10 MG PO TABS: 10.0000 mg | ORAL_TABLET | Freq: Every day | ORAL | Status: DC

## 2015-03-07 ENCOUNTER — Encounter: Payer: Self-pay | Admitting: Emergency Medicine

## 2015-03-08 ENCOUNTER — Encounter: Payer: Self-pay | Admitting: Emergency Medicine

## 2015-03-14 ENCOUNTER — Other Ambulatory Visit (HOSPITAL_COMMUNITY): Payer: Self-pay | Admitting: Internal Medicine

## 2015-03-21 ENCOUNTER — Other Ambulatory Visit (HOSPITAL_COMMUNITY): Payer: Self-pay | Admitting: *Deleted

## 2015-03-21 MED ORDER — METOPROLOL SUCCINATE ER 100 MG PO TB24: 100.0000 mg | ORAL_TABLET | Freq: Two times a day (BID) | ORAL | Status: DC

## 2015-03-22 ENCOUNTER — Telehealth (HOSPITAL_COMMUNITY): Payer: Self-pay | Admitting: *Deleted

## 2015-03-22 ENCOUNTER — Other Ambulatory Visit (HOSPITAL_COMMUNITY): Payer: Self-pay | Admitting: *Deleted

## 2015-03-22 ENCOUNTER — Other Ambulatory Visit (HOSPITAL_COMMUNITY): Payer: Self-pay

## 2015-03-22 ENCOUNTER — Other Ambulatory Visit (HOSPITAL_COMMUNITY): Payer: Self-pay | Admitting: Adult Health

## 2015-03-22 DIAGNOSIS — R710 Precipitous drop in hematocrit: Secondary | ICD-10-CM

## 2015-03-22 DIAGNOSIS — I158 Other secondary hypertension: Secondary | ICD-10-CM

## 2015-03-22 DIAGNOSIS — D508 Other iron deficiency anemias: Secondary | ICD-10-CM

## 2015-03-22 MED ORDER — ATORVASTATIN CALCIUM 10 MG PO TABS: 10.0000 mg | ORAL_TABLET | Freq: Every day | ORAL | Status: DC

## 2015-03-22 NOTE — Telephone Encounter (Signed)
We received labs 8/15 PM from Dr Trudie Reed, [facility], labs wer drawn on 8/8 and showed a hgb of 6.8, caleld and spoke w/pt, she states they had informed her hgb was low but nothing was done about it.  She reports feeling more fatigued than usual, denies SOB or bleeding from anywhere.  Dr Aundra Dubin reviewed labs and ordered pt get STAT CBC and 2 units PRBCs ASAP, stop ASA, refer to GI and send to pcp.  Pt sch for transfusion 8/17 at 8 am and GI appt Thur 8/18 at 10:30, she is aware of both appts and will arrange transportation via [place], she will stop her ASA, copy faxed to her pcp Dr Harrington Challenger.

## 2015-03-23 ENCOUNTER — Encounter (HOSPITAL_COMMUNITY)
Admission: RE | Admit: 2015-03-23 | Discharge: 2015-03-23 | Disposition: A | Discharge status: Home / Self Care | Payer: Medicare Other | Source: Ambulatory Visit | Attending: Cardiology | Admitting: Cardiology

## 2015-03-23 DIAGNOSIS — D649 Anemia, unspecified: Secondary | ICD-10-CM

## 2015-03-23 HISTORY — DX: Anemia, unspecified: D64.9

## 2015-03-23 LAB — CBC
HCT: 25.7 % — ABNORMAL LOW (ref 36.0–46.0)
Hemoglobin: 7.7 g/dL — ABNORMAL LOW (ref 12.0–15.0)
MCH: 30.2 pg (ref 26.0–34.0)
MCHC: 30 g/dL (ref 30.0–36.0)
MCV: 100.8 fL — AB (ref 78.0–100.0)
Platelets: 152 10*3/uL (ref 150–400)
RBC: 2.55 MIL/uL — ABNORMAL LOW (ref 3.87–5.11)
RDW: 15 % (ref 11.5–15.5)
WBC: 6.5 10*3/uL (ref 4.0–10.5)

## 2015-03-23 LAB — PREPARE RBC (CROSSMATCH)

## 2015-03-23 LAB — ABO/RH: ABO/RH(D): O POS

## 2015-03-23 MED ORDER — SODIUM CHLORIDE 0.9 % IV SOLN: Freq: Once | INTRAVENOUS | Status: DC

## 2015-03-24 ENCOUNTER — Telehealth: Payer: Self-pay | Admitting: Hematology

## 2015-03-24 ENCOUNTER — Ambulatory Visit (INDEPENDENT_AMBULATORY_CARE_PROVIDER_SITE_OTHER): Payer: Medicare Other | Admitting: Gastroenterology

## 2015-03-24 ENCOUNTER — Encounter: Payer: Self-pay | Admitting: Gastroenterology

## 2015-03-24 VITALS — BP 94/60 | HR 60 | Ht 64.0 in | Wt 215.0 lb

## 2015-03-24 DIAGNOSIS — D649 Anemia, unspecified: Secondary | ICD-10-CM

## 2015-03-24 LAB — TYPE AND SCREEN
ABO/RH(D): O POS
Antibody Screen: NEGATIVE
Unit division: 0
Unit division: 0

## 2015-03-24 NOTE — Patient Instructions (Addendum)
We have referred you to hematology to see Dr. Lia Hopping on August 23rd at 1:00pm, please arrive at 12:30 for check in.  He is located in the [facility]. [address] is [address] address.  The phone # is [phone] if you need to reach them.   I appreciate the opportunity to care for you.

## 2015-03-24 NOTE — Telephone Encounter (Signed)
new patient appt-s/w patient and gave np appt for 08/23 @ 12:30 w/Dr. Irene Limbo Referring Dr. Myrtice Lauth Dx-Normocytic Anemia

## 2015-03-29 ENCOUNTER — Other Ambulatory Visit (HOSPITAL_BASED_OUTPATIENT_CLINIC_OR_DEPARTMENT_OTHER): Payer: Medicare Other

## 2015-03-29 ENCOUNTER — Encounter: Payer: Self-pay | Admitting: Hematology

## 2015-03-29 ENCOUNTER — Ambulatory Visit: Payer: Medicare Other

## 2015-03-29 ENCOUNTER — Telehealth: Payer: Self-pay | Admitting: Hematology

## 2015-03-29 ENCOUNTER — Ambulatory Visit (HOSPITAL_BASED_OUTPATIENT_CLINIC_OR_DEPARTMENT_OTHER): Payer: Medicare Other | Admitting: Hematology

## 2015-03-29 VITALS — BP 107/52 | HR 57 | Temp 98.5°F | Resp 18 | Ht 64.0 in | Wt 215.2 lb

## 2015-03-29 DIAGNOSIS — I10 Essential (primary) hypertension: Secondary | ICD-10-CM

## 2015-03-29 DIAGNOSIS — D649 Anemia, unspecified: Secondary | ICD-10-CM | POA: Diagnosis not present

## 2015-03-29 DIAGNOSIS — M069 Rheumatoid arthritis, unspecified: Secondary | ICD-10-CM | POA: Diagnosis not present

## 2015-03-29 DIAGNOSIS — D6489 Other specified anemias: Secondary | ICD-10-CM

## 2015-03-29 DIAGNOSIS — N189 Chronic kidney disease, unspecified: Secondary | ICD-10-CM | POA: Diagnosis not present

## 2015-03-29 DIAGNOSIS — I2510 Atherosclerotic heart disease of native coronary artery without angina pectoris: Secondary | ICD-10-CM | POA: Diagnosis not present

## 2015-03-29 LAB — COMPREHENSIVE METABOLIC PANEL (CC13)
ALT: 14 U/L (ref 0–55)
ANION GAP: 8 meq/L (ref 3–11)
AST: 16 U/L (ref 5–34)
Albumin: 3.7 g/dL (ref 3.5–5.0)
Alkaline Phosphatase: 64 U/L (ref 40–150)
BILIRUBIN TOTAL: 0.3 mg/dL (ref 0.20–1.20)
BUN: 38 mg/dL — AB (ref 7.0–26.0)
CHLORIDE: 112 meq/L — AB (ref 98–109)
CO2: 26 meq/L (ref 22–29)
CREATININE: 2.1 mg/dL — AB (ref 0.6–1.1)
Calcium: 10.4 mg/dL (ref 8.4–10.4)
EGFR: 27 mL/min/{1.73_m2} — ABNORMAL LOW (ref >90)
GLUCOSE: 94 mg/dL (ref 70–140)
Potassium: 4.7 mEq/L (ref 3.5–5.1)
SODIUM: 146 meq/L — AB (ref 136–145)
TOTAL PROTEIN: 7.4 g/dL (ref 6.4–8.3)

## 2015-03-29 LAB — CBC & DIFF AND RETIC
BASO%: 0.7 % (ref 0.0–2.0)
Basophils Absolute: 0 10*3/uL (ref 0.0–0.1)
EOS%: 1.1 % (ref 0.0–7.0)
Eosinophils Absolute: 0.1 10*3/uL (ref 0.0–0.5)
HCT: 30.7 % — ABNORMAL LOW (ref 34.8–46.6)
HGB: 9.6 g/dL — ABNORMAL LOW (ref 11.6–15.9)
IMMATURE RETIC FRACT: 4.4 % (ref 1.60–10.00)
LYMPH#: 0.7 10*3/uL — AB (ref 0.9–3.3)
LYMPH%: 13.7 % — ABNORMAL LOW (ref 14.0–49.7)
MCH: 29.4 pg (ref 25.1–34.0)
MCHC: 31.4 g/dL — AB (ref 31.5–36.0)
MCV: 93.8 fL (ref 79.5–101.0)
MONO#: 0.4 10*3/uL (ref 0.1–0.9)
MONO%: 7.1 % (ref 0.0–14.0)
NEUT#: 4.2 10*3/uL (ref 1.5–6.5)
NEUT%: 77.4 % — AB (ref 38.4–76.8)
PLATELETS: 125 10*3/uL — AB (ref 145–400)
RBC: 3.27 10*6/uL — AB (ref 3.70–5.45)
RDW: 17.2 % — AB (ref 11.2–14.5)
RETIC %: 1.55 % (ref 0.70–2.10)
RETIC CT ABS: 50.69 10*3/uL (ref 33.70–90.70)
WBC: 5.5 10*3/uL (ref 3.9–10.3)

## 2015-03-29 LAB — LACTATE DEHYDROGENASE (CC13): LDH: 308 U/L — ABNORMAL HIGH (ref 125–245)

## 2015-03-29 LAB — CHCC SMEAR

## 2015-03-29 LAB — TECHNOLOGIST REVIEW

## 2015-03-29 NOTE — Progress Notes (Signed)
Checked in new pt with no financial concerns. °

## 2015-03-29 NOTE — Progress Notes (Signed)
[name]    HEMATOLOGY/ONCOLOGY CONSULTATION NOTE  Date of Service: 03/29/2015  Patient Care Team: Lona Kettle, MD as PCP - General (Family Medicine)  CHIEF COMPLAINTS/PURPOSE OF CONSULTATION:  Anemia.  HISTORY OF PRESENTING ILLNESS:  April Mueller is a wonderful 69 y.o. female who has been referred to [place] by  Melinda Crutch, MD for evaluation and management of anemia.  April Mueller has a history of hypertension, dyslipidemia, CHF, moderate to severe mitral regurgitation, sleep apnea on CPAP, pulmonary hypertension on chronic oxygen therapy ( was previously on Bosental and tracleer), chronic kidney disease stage III not on dialysis. She also has a history of rheumatoid arthritis for which she is on plaquenil currently.  She is on allopurinol for history of gout.  Patient is noted to be anemic at least since January 2016 when we have labs. Her hemoglobin has been in the high of 9 to low 10 range. Patient's hemoglobin dropped to 7.7 on 03/23/2015. MCV of 100.8. She received 2 units of blood and her hemoglobin today post transfusion is 9.6.  She notes that she was recently evaluated at [facility] for possible trans- aortic mitral valve repair but was deemed not to be a good candidate for this procedure. She is sad that her overall health is so poor and that she has to get around with portable oxygen tank all the time.  No fevers/chills/night sweats/unexplained weight loss. She has been an ex-smoker and quit smoking in 2015.   MEDICAL HISTORY:  Past Medical History  Diagnosis Date  . Hypertension   . Pulmonary HTN   . Aortic aneurysm   . Sleep apnea     wears CPAP  . CKD (chronic kidney disease) stage 3, GFR 30-59 ml/min   . Anemia 03/23/15    transfusion  . Arthritis   . CHF (congestive heart failure)   . Gout   . Rheumatoid arthritis(714.0) 11/06/2012    SURGICAL HISTORY: Past Surgical History  Procedure Laterality Date  . Abdominal aortic aneurysm repair    . Right heart  catheterization N/A 08/18/2014    Procedure: RIGHT HEART CATH;  Surgeon: Jolaine Artist, MD;  Location: [facility] CATH LAB;  Service: Cardiovascular;  Laterality: N/A;  . Tee without cardioversion N/A 01/05/2015    Procedure: TRANSESOPHAGEAL ECHOCARDIOGRAM (TEE);  Surgeon: Larey Dresser, MD;  Location: [place];  Service: Cardiovascular;  Laterality: N/A;  . Wisdom tooth extraction      SOCIAL HISTORY: Social History   Social History  . Marital Status: Legally Separated    Spouse Name: N/A  . Number of Children: 1  . Years of Education: N/A   Occupational History  . Disability     Office Work   Social History Main Topics  . Smoking status: Former Smoker -- 2.00 packs/day for 14 years    Types: Cigarettes    Quit date: 08/09/2003  . Smokeless tobacco: Never Used  . Alcohol Use: No  . Drug Use: No  . Sexual Activity: Not on file   Other Topics Concern  . Not on file   Social History Narrative    FAMILY HISTORY: Family History  Problem Relation Age of Onset  . Heart attack Mother   . Prostate cancer Father   . Diabetes Brother   . Kidney failure Brother     ALLERGIES:  has No Known Allergies.  MEDICATIONS:  Current Outpatient Prescriptions  Medication Sig Dispense Refill  . allopurinol (ZYLOPRIM) 300 MG tablet Take 300 mg by mouth daily.    [name]  ALPRAZolam (XANAX) 0.25 MG tablet Take 0.25 mg by mouth as needed for sleep.    [name] amLODipine (NORVASC) 5 MG tablet Take 1 tablet (5 mg total) by mouth daily. 90 tablet 3  . atorvastatin (LIPITOR) 10 MG tablet Take 1 tablet (10 mg total) by mouth daily. 90 tablet 3  . b complex vitamins tablet Take 1 tablet by mouth daily.    . ferrous sulfate (SLOW FE) 160 (50 FE) MG TBCR SR tablet Take 160 mg by mouth daily.    [name] FLUOCINOLONE ACETONIDE BODY 0.01 % OIL Apply 1 mL topically 2 (two) times daily as needed (for flares).   3  . furosemide (LASIX) 40 MG tablet 40 mg in the AM amd 20 mg in the PM 180 tablet 3  . hydroxychloroquine  (PLAQUENIL) 200 MG tablet Take 200 mg by mouth 2 (two) times daily.    [name] ketoconazole (NIZORAL) 2 % shampoo Apply 1 application topically 2 (two) times a week. Leave on 5 minutes before rinsing out  3  . Macitentan (OPSUMIT) 10 MG TABS Take 10 mg by mouth daily. 30 tablet 5  . metoprolol succinate (TOPROL-XL) 100 MG 24 hr tablet Take 1 tablet (100 mg total) by mouth 2 (two) times daily. 180 tablet 2  . Omega-3 Fatty Acids (FISH OIL) 1200 MG CAPS Take 1,200 mg by mouth daily.    . OXYGEN Inhale 6 L into the lungs continuous.    . Potassium Chloride ER 20 MEQ TBCR Take 20 mEq by mouth daily. 108 tablet 3  . spironolactone (ALDACTONE) 25 MG tablet Take 1 tablet (25 mg total) by mouth daily. 90 tablet 3   No current facility-administered medications for this visit.    REVIEW OF SYSTEMS:    10 Point review of Systems was done is negative except as noted above.  PHYSICAL EXAMINATION: ECOG PERFORMANCE STATUS: 3 - Symptomatic, >50% confined to bed  . Filed Vitals:   03/29/15 1306  Height: 5' 4" (1.626 m)  Weight: 215 lb 3.2 oz (97.614 kg)   Filed Weights   03/29/15 1306  Weight: 215 lb 3.2 oz (97.614 kg)   .Body mass index is 36.92 kg/(m^2).  GENERAL:alert, in no acute distress and comfortable SKIN: skin color, texture, turgor are normal, no rashes or significant lesions EYES: normal, conjunctiva are pink and non-injected, sclera clear OROPHARYNX:no exudate, no erythema and lips, buccal mucosa, and tongue normal  NECK: supple, no JVD, thyroid normal size, non-tender, without nodularity LYMPH:  no palpable lymphadenopathy in the cervical, axillary or inguinal LUNGS: clear to auscultation with normal respiratory effort HEART: regular rate & rhythm,  no murmurs and no lower extremity edema ABDOMEN: abdomen soft, non-tender, normoactive bowel sounds  Musculoskeletal: no cyanosis of digits and no clubbing  PSYCH: alert & oriented x 3 with fluent speech NEURO: no focal motor/sensory  deficits  LABORATORY DATA:  I have reviewed the data as listed  . CBC Latest Ref Rng 03/29/2015 03/23/2015 12/17/2014  WBC 3.9 - 10.3 10e3/uL 5.5 6.5 5.7  Hemoglobin 11.6 - 15.9 g/dL 9.6(L) 7.7(L) 10.4(L)  Hematocrit 34.8 - 46.6 % 30.7(L) 25.7(L) 32.4(L)  Platelets 145 - 400 10e3/uL 125(L) 152 120.0(L)        . CMP Latest Ref Rng 03/29/2015 02/16/2015 01/17/2015  Glucose 70 - 140 mg/dl 94 111(H) 108(H)  BUN 7.0 - 26.0 mg/dL 38.0(H) 50(H) 49(H)  Creatinine 0.6 - 1.1 mg/dL 2.1(H) 2.63(H) 2.13(H)  Sodium 136 - 145 mEq/L 146(H) 140 140  Potassium 3.5 -  5.1 mEq/L 4.7 4.9 4.6  Chloride 101 - 111 mmol/L - 106 108  CO2 22 - 29 mEq/L _0 Calcium 8.4 - 10.4 mg/dL 10.4 9.8 10.4(H)  Total Protein 6.4 - 8.3 g/dL 7.4 - -  Total Bilirubin 0.20 - 1.20 mg/dL 0.30 - -  Alkaline Phos 40 - 150 U/L 64 - -  AST 5 - 34 U/L 16 - -  ALT 0 - 55 U/L 14 - -    vitamin B12: 672 Haptoglobin 58 Erythropoietin 37.3 Rheumatoid factor 21 CRP 1.3 Sedimentation rate 21  .Marland KitchenNo results found for: IRON, TIBC, IRONPCTSAT (Iron and TIBC)  No results found for: FERRITIN   Lab Results  Component Value Date   LDH 308* 03/29/2015   Peripheral Blood Smear: Personally reviewed by me.  RBC show macro ovalocytes, and that decides, few acanthocytes and teardrop cells. No increased schistocytes noted. No platelet clumping. Several large platelets noted. Several hypogranular platelets noted. Multiple pelgeroid WBCs. Overall picture suggestive of myelodysplastic syndrome.      RADIOGRAPHIC STUDIES: I have personally reviewed the radiological images as listed and agreed with the findings in the report. No results found.  ASSESSMENT & PLAN:   69 year old female with multiple medical comorbidities including hypertension, COPD, CHF, gout, rheumatoid arthritis, moderate mitral regurgitation with  #1 Normocytic Normochromic anemia. Patient appears to be running a baseline hemoglobin of 9-10 at least since  January 2016. MCV has been in the low 90s. Recently the patient's hemoglobin dropped to 7.7 with a bump in the MCV 100.8 suggesting a reticulocyte response. The acute drop could be from acute blood loss versus hemolysis. Currently patient has no evidence of hemolysis with a normal bilirubin level, normal haptoglobin borderline elevated LDH in the setting of recent transfusion. Patient reports no clinically evident overt bleeding.  Her anemia is likely multifactorial from her chronic kidney disease plus rheumatoid arthritis. Her allopurinol and plaquenil could certainly be an additional factors especially if there is worsening renal function. 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  No clinical evidence of splenomegaly to suggest overt hypersplenism.  Her reticulocyte response is difficult to assess in the setting of recent transfusion. #2 Mild thrombocytopenia #2 rheumatoid arthritis #3 chronic kidney disease #4 pulmonary hypertension #5 CHF  Plan  -Consider reduction of allopurinol dose if gout is stable -Will follow-up the patient in 1 month to monitor the trend of developing anemia. -we will consider the use of ESA given her CKD and signs of MDS if her blood pressure is well controlled. -We'll check iron stores prior to consideration of ESA's -We'll consider possible bone marrow biopsy if she has worsening cytopenias. -Continue optimal treatment of rheumatoid arthritis. Inflammatory markers and rheumatoid factor not impressively elevated. -We'll get an SPEP with IFE on next lab draw. -Continue follow-up with primary care physician for other medical cares.  Return to care with Dr. Irene Limbo in one month with CBC, CMP and SPEP with IFE All of the patients questions were answered with apparent satisfaction. The patient knows to call the clinic with any problems, questions or concerns.  I spent 45 minutes counseling the patient face to face. The  total time spent in the appointment was 60 minutes and more than 50% was on counseling and direct patient cares.    Sullivan Lone MD [place] AAHIVMS [facility] Hematology/Oncology Physician [facility]  (Office):       [phone] (Work cell):  [phone] (Fax):  [phone]  03/29/2015 12:59 PM

## 2015-03-29 NOTE — Telephone Encounter (Signed)
Gave and printed appt sched and avs for pt for Sept °

## 2015-03-31 LAB — SEDIMENTATION RATE: Sed Rate: 21 mm/hr (ref 0–30)

## 2015-03-31 LAB — C-REACTIVE PROTEIN: CRP: 1.3 mg/dL — AB (ref <0.60)

## 2015-03-31 LAB — FOLATE RBC: RBC Folate: 1245 ng/mL (ref >280)

## 2015-03-31 LAB — HAPTOGLOBIN: HAPTOGLOBIN: 58 mg/dL (ref 43–212)

## 2015-03-31 LAB — VITAMIN B12: VITAMIN B 12: 672 pg/mL (ref 211–911)

## 2015-03-31 LAB — RHEUMATOID FACTOR: Rhuematoid fact SerPl-aCnc: 21 IU/mL — ABNORMAL HIGH (ref <=14)

## 2015-03-31 LAB — ERYTHROPOIETIN: Erythropoietin: 37.3 m[IU]/mL — ABNORMAL HIGH (ref 2.6–18.5)

## 2015-04-01 ENCOUNTER — Encounter: Payer: Self-pay | Admitting: Hematology

## 2015-04-03 ENCOUNTER — Encounter: Payer: Self-pay | Admitting: Gastroenterology

## 2015-04-04 ENCOUNTER — Telehealth: Payer: Self-pay | Admitting: Hematology

## 2015-04-04 DIAGNOSIS — D649 Anemia, unspecified: Secondary | ICD-10-CM | POA: Insufficient documentation

## 2015-04-04 NOTE — Progress Notes (Signed)
Case discussed. Agree with initial assessment and plans as outlined in this high-risk patient

## 2015-04-04 NOTE — Progress Notes (Signed)
04/04/2015 April Mueller [id number] 04-23-1946   HISTORY OF PRESENT ILLNESS:  This is a 69 year old female with multiple medical problems including HTN, HLD pulmonary HTN on chronic O2, aortic aneurysm (repaired), sleep apnea for which she wears CPAP, CKD stage 3, CHF, moderate to severe mitral regurgitation (evaluated in [place] for possible TAVR but deemed not to be a good candidate), gout, and RA.  She has been referred to our practice as a new patient by her PCP, Dr. Harrington Challenger, for evaluation of her anemia.  Only have Hgb's available since January.  08/2014 Hgb was 9.9 grams, 12/2014 Hgb was 10.4 grams and then yesterday it was 7.7 grams.  She did receive 2 units PRBC's.  Her only complaint is fatigue.  Denies any GI complaints including dark or bloody stools.  May have had colonoscopy in [place] several years ago (5 years or more).  MCV is slightly high at 110.8, WNL's previously.  No iron studies, B12, folate levels, etc.  Platelet levels normal now, but were slightly low previously.   Past Medical History  Diagnosis Date  . Hypertension   . Pulmonary HTN   . Aortic aneurysm   . Sleep apnea     wears CPAP  . CKD (chronic kidney disease) stage 3, GFR 30-59 ml/min   . Anemia 03/23/15    transfusion  . Arthritis   . CHF (congestive heart failure)   . Gout   . Rheumatoid arthritis(714.0) 11/06/2012   Past Surgical History  Procedure Laterality Date  . Abdominal aortic aneurysm repair    . Right heart catheterization N/A 08/18/2014    Procedure: RIGHT HEART CATH;  Surgeon: Jolaine Artist, MD;  Location: [facility] CATH LAB;  Service: Cardiovascular;  Laterality: N/A;  . Tee without cardioversion N/A 01/05/2015    Procedure: TRANSESOPHAGEAL ECHOCARDIOGRAM (TEE);  Surgeon: Larey Dresser, MD;  Location: [place];  Service: Cardiovascular;  Laterality: N/A;  . Wisdom tooth extraction      reports that she quit smoking about 11 years ago. Her smoking use included Cigarettes. She has a 21  pack-year smoking history. She has never used smokeless tobacco. She reports that she does not drink alcohol or use illicit drugs. family history includes Diabetes in her brother; Heart attack in her mother; Kidney failure in her brother; Prostate cancer in her father. No Known Allergies    Outpatient Encounter Prescriptions as of 03/24/2015  Medication Sig  . allopurinol (ZYLOPRIM) 300 MG tablet Take 300 mg by mouth daily.  [name] ALPRAZolam (XANAX) 0.25 MG tablet Take 0.25 mg by mouth as needed for sleep.  [name] amLODipine (NORVASC) 5 MG tablet Take 1 tablet (5 mg total) by mouth daily.  [name] atorvastatin (LIPITOR) 10 MG tablet Take 1 tablet (10 mg total) by mouth daily.  [name] b complex vitamins tablet Take 1 tablet by mouth daily.  . ferrous sulfate (SLOW FE) 160 (50 FE) MG TBCR SR tablet Take 160 mg by mouth daily.  [name] FLUOCINOLONE ACETONIDE BODY 0.01 % OIL Apply 1 mL topically 2 (two) times daily as needed (for flares).   . furosemide (LASIX) 40 MG tablet 40 mg in the AM amd 20 mg in the PM  . hydroxychloroquine (PLAQUENIL) 200 MG tablet Take 200 mg by mouth 2 (two) times daily.  [name] ketoconazole (NIZORAL) 2 % shampoo Apply 1 application topically 2 (two) times a week. Leave on 5 minutes before rinsing out  . Macitentan (OPSUMIT) 10 MG TABS Take 10 mg by  mouth daily.  . metoprolol succinate (TOPROL-XL) 100 MG 24 hr tablet Take 1 tablet (100 mg total) by mouth 2 (two) times daily.  . Omega-3 Fatty Acids (FISH OIL) 1200 MG CAPS Take 1,200 mg by mouth daily.  . OXYGEN Inhale 6 L into the lungs continuous.  . Potassium Chloride ER 20 MEQ TBCR Take 20 mEq by mouth daily.  [name] spironolactone (ALDACTONE) 25 MG tablet Take 1 tablet (25 mg total) by mouth daily.   No facility-administered encounter medications on file as of 03/24/2015.     REVIEW OF SYSTEMS  : All other systems reviewed and negative except where noted in the History of Present Illness.   PHYSICAL EXAM: BP 94/60 mmHg  Pulse 60  Ht 5\' 4"   (1.626 m)  Wt 215 lb (97.523 kg)  BMI 36.89 kg/m2 General: Well developed black female in no acute distress Head: Normocephalic and atraumatic Eyes:  Sclerae anicteric, conjunctiva pink. Ears: Normal auditory acuity Lungs: Decreased BS Heart: Regular rate and rhythm.  Murmur noted. Abdomen: Soft, non-distended.  Normal bowel sounds.  Non-tender. Rectal:  Heme negative. Musculoskeletal: Symmetrical with no gross deformities  Skin: No lesions on visible extremities Extremities: No edema  Neurological: Alert oriented x 4, grossly non-focal Psychological:  Alert and cooperative. Normal mood and affect  ASSESSMENT AND PLAN: -Anemia, normocytic:  Acute on chronic.  No sign of overt GI bleeding and heme negative today.  No GI complaints.  Likely a component of chronic disease.  No iron studies, etc.  Will refer to hematology for further evaluation.  Patient is at high risk for procedures.  If hematology thinks she needs GI evaluation then would consider virtual colonoscopy.  Discussed with Dr. Henrene Pastor.  CC:  Larey Dresser, MD

## 2015-04-04 NOTE — Telephone Encounter (Signed)
cld & spoke to pt to adv of added appt on 9/13-pt stated will look on MY CHART as well-pt understood

## 2015-04-19 ENCOUNTER — Other Ambulatory Visit (HOSPITAL_BASED_OUTPATIENT_CLINIC_OR_DEPARTMENT_OTHER): Payer: Medicare Other

## 2015-04-19 DIAGNOSIS — D649 Anemia, unspecified: Secondary | ICD-10-CM

## 2015-04-19 DIAGNOSIS — D6489 Other specified anemias: Secondary | ICD-10-CM

## 2015-04-19 LAB — IRON AND TIBC CHCC
%SAT: 11 % — ABNORMAL LOW (ref 21–57)
Iron: 35 ug/dL — ABNORMAL LOW (ref 41–142)
TIBC: 330 ug/dL (ref 236–444)
UIBC: 295 ug/dL (ref 120–384)

## 2015-04-19 LAB — FERRITIN CHCC: Ferritin: 36 ng/ml (ref 9–269)

## 2015-04-21 ENCOUNTER — Ambulatory Visit (HOSPITAL_COMMUNITY)
Admission: RE | Admit: 2015-04-21 | Discharge: 2015-04-21 | Disposition: A | Discharge status: Home / Self Care | Payer: Medicare Other | Source: Ambulatory Visit | Attending: Cardiology | Admitting: Cardiology

## 2015-04-21 VITALS — BP 90/52 | HR 61 | Wt 218.2 lb

## 2015-04-21 DIAGNOSIS — Z9981 Dependence on supplemental oxygen: Secondary | ICD-10-CM | POA: Insufficient documentation

## 2015-04-21 DIAGNOSIS — G4733 Obstructive sleep apnea (adult) (pediatric): Secondary | ICD-10-CM | POA: Diagnosis not present

## 2015-04-21 DIAGNOSIS — I5032 Chronic diastolic (congestive) heart failure: Secondary | ICD-10-CM | POA: Insufficient documentation

## 2015-04-21 DIAGNOSIS — Z833 Family history of diabetes mellitus: Secondary | ICD-10-CM | POA: Insufficient documentation

## 2015-04-21 DIAGNOSIS — Z8249 Family history of ischemic heart disease and other diseases of the circulatory system: Secondary | ICD-10-CM | POA: Insufficient documentation

## 2015-04-21 DIAGNOSIS — N183 Chronic kidney disease, stage 3 unspecified: Secondary | ICD-10-CM

## 2015-04-21 DIAGNOSIS — R011 Cardiac murmur, unspecified: Secondary | ICD-10-CM | POA: Diagnosis not present

## 2015-04-21 DIAGNOSIS — IMO0002 Reserved for concepts with insufficient information to code with codable children: Secondary | ICD-10-CM

## 2015-04-21 DIAGNOSIS — M069 Rheumatoid arthritis, unspecified: Secondary | ICD-10-CM | POA: Insufficient documentation

## 2015-04-21 DIAGNOSIS — I272 Other secondary pulmonary hypertension: Secondary | ICD-10-CM | POA: Insufficient documentation

## 2015-04-21 DIAGNOSIS — D649 Anemia, unspecified: Secondary | ICD-10-CM | POA: Insufficient documentation

## 2015-04-21 DIAGNOSIS — I5022 Chronic systolic (congestive) heart failure: Secondary | ICD-10-CM | POA: Diagnosis not present

## 2015-04-21 DIAGNOSIS — J9610 Chronic respiratory failure, unspecified whether with hypoxia or hypercapnia: Secondary | ICD-10-CM | POA: Insufficient documentation

## 2015-04-21 DIAGNOSIS — I129 Hypertensive chronic kidney disease with stage 1 through stage 4 chronic kidney disease, or unspecified chronic kidney disease: Secondary | ICD-10-CM | POA: Diagnosis not present

## 2015-04-21 DIAGNOSIS — Z79899 Other long term (current) drug therapy: Secondary | ICD-10-CM | POA: Diagnosis not present

## 2015-04-21 DIAGNOSIS — J9611 Chronic respiratory failure with hypoxia: Secondary | ICD-10-CM

## 2015-04-21 DIAGNOSIS — Z87891 Personal history of nicotine dependence: Secondary | ICD-10-CM | POA: Diagnosis not present

## 2015-04-21 DIAGNOSIS — I340 Nonrheumatic mitral (valve) insufficiency: Secondary | ICD-10-CM

## 2015-04-21 LAB — SPEP & IFE WITH QIG
ALPHA-2-GLOBULIN: 0.6 g/dL (ref 0.5–0.9)
Albumin ELP: 3.6 g/dL — ABNORMAL LOW (ref 3.8–4.8)
Alpha-1-Globulin: 0.4 g/dL — ABNORMAL HIGH (ref 0.2–0.3)
BETA GLOBULIN: 0.5 g/dL (ref 0.4–0.6)
Beta 2: 0.5 g/dL (ref 0.2–0.5)
Gamma Globulin: 1.6 g/dL (ref 0.8–1.7)
IGA: 404 mg/dL — AB (ref 69–380)
IgG (Immunoglobin G), Serum: 1670 mg/dL (ref 690–1700)
IgM, Serum: 95 mg/dL (ref 52–322)
TOTAL PROTEIN, SERUM ELECTROPHOR: 7.2 g/dL (ref 6.1–8.1)

## 2015-04-21 LAB — BASIC METABOLIC PANEL
ANION GAP: 6 (ref 5–15)
BUN: 31 mg/dL — AB (ref 6–20)
CO2: 26 mmol/L (ref 22–32)
Calcium: 10.2 mg/dL (ref 8.9–10.3)
Chloride: 108 mmol/L (ref 101–111)
Creatinine, Ser: 2.51 mg/dL — ABNORMAL HIGH (ref 0.44–1.00)
GFR, EST AFRICAN AMERICAN: 22 mL/min — AB (ref >60)
GFR, EST NON AFRICAN AMERICAN: 19 mL/min — AB (ref >60)
Glucose, Bld: 116 mg/dL — ABNORMAL HIGH (ref 65–99)
POTASSIUM: 4.6 mmol/L (ref 3.5–5.1)
SODIUM: 140 mmol/L (ref 135–145)

## 2015-04-21 LAB — BRAIN NATRIURETIC PEPTIDE: B NATRIURETIC PEPTIDE 5: 318.9 pg/mL — AB (ref 0.0–100.0)

## 2015-04-21 MED ORDER — AMLODIPINE BESYLATE 5 MG PO TABS: 2.5000 mg | ORAL_TABLET | Freq: Every day | ORAL | Status: DC

## 2015-04-21 MED ORDER — FUROSEMIDE 40 MG PO TABS: 80.0000 mg | ORAL_TABLET | Freq: Every day | ORAL | Status: DC

## 2015-04-21 NOTE — Patient Instructions (Signed)
Stop Opsumit, if not feeling better off medication in a few weeks please restart  Decrease Amlodipine to 2.5 mg (1/2 tab) daily  Increase Furosemide (Lasix) to 80 mg (2 tabs) daily  Labs today  Labs in 2 weeks  Your physician recommends that you schedule a follow-up appointment in: 1 month

## 2015-04-22 DIAGNOSIS — N183 Chronic kidney disease, stage 3 unspecified: Secondary | ICD-10-CM | POA: Insufficient documentation

## 2015-04-22 NOTE — Progress Notes (Signed)
Patient ID: DEBI COUSIN, female   DOB: [birth year], 69 y.o.   MRN: [id number]   HPI  PCP: Dr Harrington Challenger Pulmonary: Dr Byrum/Dr Melvyn Novas   Ms Rigdon is a 69 yo with following PMHx  1) COPD       --former smoker (28 pk-yrs)       --on home O2 - 4L/min rest, up to 5L/min exertion.        --spirometry 6/13 in [place] = 0.85L FVC = 1.3L no DLCO measured. FEV1 in 2014 = 0.73        --CT chest 10/14 - moderate centrilobular emphysema. No pulm fibrosis but there was evidence for post-infectious/inflammatory scarring. Stable Asc Ao repair       --PFTs (11/14) FVC 46%, FEV1 0.8L (40%), ratio 87%, FEF 25-75% 0.44L DLCO 22%, TLC 50% => PFTs in 06/2013 did NOT suggest significant airflow obstruction according to Dr Gustavus Bryant last note despite "moderate emphysema" on CT.        --PFTs (6/16) were abnormal with severe obstruction, severe restriction, severely decreased DLCO.  Reviewed with Dr Melvyn Novas => he thinks obstruction is actually fairly minimal and that the restriction is due to body habitus.   2) Obesity 3) RA       --on plaquenil, sees Dr Lenna Gilford.  4) Type A aortic dissection s/p repair 2005     --c/b renal failure requiring HD x 3 months 5) PAH - previously treated in [place].  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      --on macitentan (revatio stopped 10/14).     --V/Q scan 11/14 no chronic PE     -- 2015 Tracleer stopped and started on macitentan 04/2015.  6) OSA 7) Diastolic CHF    --ECHO 6/76 EF 55-60% RV mildly dilated with normal function. Mild to moderate posterior MR. RVSP 56. Probable restrictive diastolic filling pattern    --ECHO 9/15 EF 60-65% RV mildly dilated with normal function. Mild to moderate posterior MR. RVSP 40. Ao Root ok    --TEE 6/16 with EF 55-60%, D-shaped interventricular septum suggestive of RV pressure/volume overload, RV mildly dilated with normal systolic function, peak RV-RA gradient 57 mmHg, very eccentric posteriorly-directed mitral regurgitation, possibly severe, etiology may be a very small  area of prolapse on the anterior leaflet, s/p ascending aorta repair with residual dissection flap in the descending thoracic aorta.  8) CKD 9) RHC 08/18/2014: No role for pulmonary vasodilators.  RA = 14 RV = 62/3/15 PA = 63/21 (38) PCW = mean 23 v waves to 40 Fick cardiac output/index = 6.3/3.2 Thermodilution CO/CI = 6.1/3.1, PVR = 2.0 WU, FA sat = 91% PA sat = 57%, 62% 10) Mitral regurgitation: Possibly severe by 6/16 TEE, very eccentric.  Not good candidate for percutaneous MV clip (seen at [facility]).  11) Anemia: Suspect from chronic disease/renal disease.   Moved back from [place] and saw Dr. Lamonte Sakai. In [place] was followed by cardiology and pulmonary. Started on Tracleer in 2006. Revatio was added, however was discontinued without any change in her symptoms. Stopped smoking in January 2005 when she had aortic aneurysm repair. Smoked 1.5-2 ppd x 15 - 20 years.  Now on macitentan.  I had her do a TEE in 6/16.  She had a loud MR murmur.  TEE showed very eccentric, posteriorly-directed MR that may be from subtle prolapse of the anterior mitral leaflet.  I am concerned that the MR could be severe but very difficult to fully visualize.  She additionally had PFTs done that were abnormal,  but according to her pulmonologist Melvyn Novas), the obstruction was really only minimal and the restriction was primarily related to body habitus.  He does not think that we can explain her hypoxemia and dyspnea primarily from parenchymal lung pathology.  I sent her to [facility] for evaluation for percutaneous mitral valve clip.  They did not think that she was a good candidate.   She has been anemic and received 1 unit PRBCs in 8/16.  She saw GI and was noted to be heme negative, no scopes were done (heme negative and high risk).   She remains on 6 L home oxygen and CPAP at night.  Feels stable to improved.  Weight is up 4 lbs.  She can now walk up to 100 yards before getting short of breath.  No lightheadedness, no chest pain.  No falls.      Labs 02/05/13 K 3.8 Creatinine 1.37 05/08/13 K 3.8, Creatinine 1.58 11/14 K 4.1, creatinine 1.82 08/09/2014: K 4.0 Creatinine 1.45  5/16: K 3.7, creatinine 1.76, BNP 447 6/16: K 5 => 4.6, creatinine 2.09 => 2.13, BNP 291 8/16: K 4.7, creatinine 2.1, HCT 30.7  Past Medical History  Diagnosis Date  . Hypertension   . Pulmonary HTN   . Aortic aneurysm   . Sleep apnea     wears CPAP  . CKD (chronic kidney disease) stage 3, GFR 30-59 ml/min   . Anemia 03/23/15    transfusion  . Arthritis   . CHF (congestive heart failure)   . Gout   . Rheumatoid arthritis(714.0) 11/06/2012   Current Outpatient Prescriptions on File Prior to Encounter  Medication Sig Dispense Refill  . allopurinol (ZYLOPRIM) 300 MG tablet Take 300 mg by mouth daily.    [name] ALPRAZolam (XANAX) 0.25 MG tablet Take 0.25 mg by mouth as needed for sleep.    [name] atorvastatin (LIPITOR) 10 MG tablet Take 1 tablet (10 mg total) by mouth daily. 90 tablet 3  . b complex vitamins tablet Take 1 tablet by mouth daily.    . ferrous sulfate (SLOW FE) 160 (50 FE) MG TBCR SR tablet Take 160 mg by mouth daily.    [name] FLUOCINOLONE ACETONIDE BODY 0.01 % OIL Apply 1 mL topically 2 (two) times daily as needed (for flares).   3  . hydroxychloroquine (PLAQUENIL) 200 MG tablet Take 200 mg by mouth 2 (two) times daily.    [name] ketoconazole (NIZORAL) 2 % shampoo Apply 1 application topically 2 (two) times a week. Leave on 5 minutes before rinsing out  3  . metoprolol succinate (TOPROL-XL) 100 MG 24 hr tablet Take 1 tablet (100 mg total) by mouth 2 (two) times daily. 180 tablet 2  . Omega-3 Fatty Acids (FISH OIL) 1200 MG CAPS Take 1,200 mg by mouth daily.    . OXYGEN Inhale 6 L into the lungs continuous.    . Potassium Chloride ER 20 MEQ TBCR Take 20 mEq by mouth daily. 108 tablet 3  . spironolactone (ALDACTONE) 25 MG tablet Take 1 tablet (25 mg total) by mouth daily. 90 tablet 3   No current facility-administered medications on file prior to encounter.    Social History   Social History  . Marital Status: Legally Separated    Spouse Name: N/A  . Number of Children: 1  . Years of Education: N/A   Occupational History  . Disability     Office Work   Social History Main Topics  . Smoking status: Former Smoker -- 1.50 packs/day for 14 years  Types: Cigarettes    Quit date: 08/09/2003  . Smokeless tobacco: Never Used  . Alcohol Use: No  . Drug Use: No  . Sexual Activity: Not Currently   Other Topics Concern  . Not on file   Social History Narrative   Family History  Problem Relation Age of Onset  . Heart attack Mother   . Prostate cancer Father   . Diabetes Brother   . Kidney failure Brother     Filed Vitals:   04/21/15 0911  BP: 90/52  Pulse: 61  Weight: 218 lb 4 oz (98.998 kg)  SpO2: 95%   Physical Exam Gen: Pleasant, well-nourished, in no distress, normal affect; on O2 by [place].  HEENT: normal Neck: JVP 8-9 cm. Lungs: Decreased BS throughout. On 6 liters [place].  Cardiovascular: well healed sternotomy scar  PMI normal. RRR, heart sounds normal, 3/6 HSM apex.  Extremities: No deformities, no cyanosis or clubbing, 1+ edema to knees bilaterally.  Neuro: alert, non focal. Cranial nerves intact. Moves all 4 extremities without difficulty Skin: Warm, no lesions or rashes  Assessment/Plan: 1. 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  This appeared to be primarily pulmonary venous hypertension.  Interestingly, there were very prominent V waves in the PCWP tracing. On exam, she has a prominent MR murmur.  I was concerned that significant MR may be playing a major role in her symptomatology. I did a TEE in 6/16 showing very eccentric, posteriorly-directed mitral regurgitation possibly from subtle anterior leaflet prolapse.  I think that the MR could be severe but is difficult to fully  visualize.  Her pulmonologist does not think that lung parenchymal disease adequately explains her degree of hypoxemia and dyspnea. She was [facility] at [facility] for consideration of percutaneous MV clip and was not thought to be a good candidate => concern that MV disease may not actually be severe and concern that her dyspnea is coming from something else and would not be improved by MV clipping.  Stable NYHA class III symptoms.  She does have some volume overload on exam.  - Increase Lasix to 80 mg daily.  BMET/BNP today and repeat in 2 wks.  2. Chronic respiratory failure:  She is on 6 L home oxygen by .  I have been concerned that she does have significant underlying lung parenchymal disease.  However, her pulmonologist (Dr Melvyn Novas), according to last note, did not think she had a significant degree of obstructive lung disease on last PFTs done this year.  He thought that the restriction was primarily due to her body habitus.  3. OSA: Continue CPAP nightly.  4. CKD: BMET today.   5. COPD: See #2 above.   6. Aortic dissection s/p repair 2005: Although complete records are not available, it appears that the patient developed a Type A dissection in 2005 with ascending aorta repair (can see the surgically repaired ascending aorta well on TEE).  She has residual dissection in the descending thoracic aorta.  7. Pulmonary hypertension: I am concerned that this is primarily WHO group 2 (pulmonary venous hypertension) and group 3 (PH due to intrinsic lung disease) rather than primarily WHO group 1 PAH.  RHC earlier this year showed low PVR.  I am not sure that macitentan is helping much.  She is willing to stop it today.  I will have her stop it, she will call if she becomes symptomatically worse.  8. Mitral regurgitation:  TEE concerning for possibly severe, eccentric mitral regurgitation, most likely due to subtle prolapse of the anterior mitral valve leaflet.  I am concerned that the MR is playing a role in her CHF.  She had prominent V-waves on RHC earlier this year, and she has a loud mitral area murmur.  I do not think that she is a good candidate for open MV surgery. I had her evaluated at [facility] for possible percutaneous mitral clipping.  She was turned down for this as her MR was not clearly severe and fixing it may not help her hypoxemia appreciably.   9. HTN: BP actually now running on the low side.  Can decrease amlodipine to 2.5 mg daily.  10. Anemia: Suspect anemia of chronic disease/renal disease.   [name] 04/22/2015

## 2015-04-26 ENCOUNTER — Encounter: Payer: Self-pay | Admitting: Hematology

## 2015-04-26 ENCOUNTER — Telehealth: Payer: Self-pay | Admitting: *Deleted

## 2015-04-26 ENCOUNTER — Telehealth: Payer: Self-pay | Admitting: Hematology

## 2015-04-26 ENCOUNTER — Ambulatory Visit (HOSPITAL_BASED_OUTPATIENT_CLINIC_OR_DEPARTMENT_OTHER): Payer: Medicare Other | Admitting: Hematology

## 2015-04-26 VITALS — BP 107/50 | HR 62 | Temp 98.9°F | Resp 18 | Ht 64.0 in | Wt 216.2 lb

## 2015-04-26 DIAGNOSIS — D649 Anemia, unspecified: Secondary | ICD-10-CM | POA: Diagnosis not present

## 2015-04-26 DIAGNOSIS — D638 Anemia in other chronic diseases classified elsewhere: Secondary | ICD-10-CM | POA: Diagnosis not present

## 2015-04-26 NOTE — Telephone Encounter (Signed)
Per staff message and POF I have scheduled appts. Advised scheduler of appts and to move labs. JMW  

## 2015-04-26 NOTE — Telephone Encounter (Signed)
Pt confirmed labs/ov per 09/20 POF, gave pt AVS and Calendar... KJ, schedule chemo and had chemo scheduler check, moved labs closer to chemo and s/w pt confirming new D/T .[name].. KJ

## 2015-04-26 NOTE — Progress Notes (Signed)
[name]    HEMATOLOGY/ONCOLOGY CLINIC NOTE  Date of Service: 04/26/2015  Patient Care Team: Lona Kettle, MD as PCP - General (Family Medicine)  CHIEF COMPLAINTS/PURPOSE OF CONSULTATION: f/u for Anemia   HISTORY OF PRESENTING ILLNESS: Please see my initial consultation for details on initial presentation.  INTERVAL HISTORY  [name] is here for her scheduled follow-up. She notes no acute new symptoms. No evidence of overt bleeding. Her breathing is about her baseline. She looks more relaxed today.   MEDICAL HISTORY:  Past Medical History  Diagnosis Date  . Hypertension   . Pulmonary HTN   . Aortic aneurysm   . Sleep apnea     wears CPAP  . CKD (chronic kidney disease) stage 3, GFR 30-59 ml/min   . Anemia 03/23/15    transfusion  . Arthritis   . CHF (congestive heart failure)   . Gout   . Rheumatoid arthritis(714.0) 11/06/2012    SURGICAL HISTORY: Past Surgical History  Procedure Laterality Date  . Abdominal aortic aneurysm repair    . Right heart catheterization N/A 08/18/2014    Procedure: RIGHT HEART CATH;  Surgeon: Jolaine Artist, MD;  Location: [facility] CATH LAB;  Service: Cardiovascular;  Laterality: N/A;  . Tee without cardioversion N/A 01/05/2015    Procedure: TRANSESOPHAGEAL ECHOCARDIOGRAM (TEE);  Surgeon: Larey Dresser, MD;  Location: [place];  Service: Cardiovascular;  Laterality: N/A;  . Wisdom tooth extraction      SOCIAL HISTORY: Social History   Social History  . Marital Status: Legally Separated    Spouse Name: N/A  . Number of Children: 1  . Years of Education: N/A   Occupational History  . Disability     Office Work   Social History Main Topics  . Smoking status: Former Smoker -- 1.50 packs/day for 14 years    Types: Cigarettes    Quit date: 08/09/2003  . Smokeless tobacco: Never Used  . Alcohol Use: No  . Drug Use: No  . Sexual Activity: Not Currently   Other Topics Concern  . Not on file   Social History Narrative    FAMILY  HISTORY: Family History  Problem Relation Age of Onset  . Heart attack Mother   . Prostate cancer Father   . Diabetes Brother   . Kidney failure Brother     ALLERGIES:  has No Known Allergies.  MEDICATIONS:  Current Outpatient Prescriptions  Medication Sig Dispense Refill  . allopurinol (ZYLOPRIM) 300 MG tablet Take 300 mg by mouth daily.    [name] ALPRAZolam (XANAX) 0.25 MG tablet Take 0.25 mg by mouth as needed for sleep.    [name] amLODipine (NORVASC) 5 MG tablet Take 0.5 tablets (2.5 mg total) by mouth daily. 90 tablet 3  . atorvastatin (LIPITOR) 10 MG tablet Take 1 tablet (10 mg total) by mouth daily. 90 tablet 3  . b complex vitamins tablet Take 1 tablet by mouth daily.    . ferrous sulfate (SLOW FE) 160 (50 FE) MG TBCR SR tablet Take 160 mg by mouth daily.    [name] FLUOCINOLONE ACETONIDE BODY 0.01 % OIL Apply 1 mL topically 2 (two) times daily as needed (for flares).   3  . furosemide (LASIX) 40 MG tablet Take 2 tablets (80 mg total) by mouth daily. 60 tablet 3  . hydroxychloroquine (PLAQUENIL) 200 MG tablet Take 200 mg by mouth 2 (two) times daily.    [name] ketoconazole (NIZORAL) 2 % shampoo Apply 1 application topically 2 (two) times a week. Leave  on 5 minutes before rinsing out  3  . metoprolol succinate (TOPROL-XL) 100 MG 24 hr tablet Take 1 tablet (100 mg total) by mouth 2 (two) times daily. 180 tablet 2  . Omega-3 Fatty Acids (FISH OIL) 1200 MG CAPS Take 1,200 mg by mouth daily.    . OXYGEN Inhale 6 L into the lungs continuous.    . Potassium Chloride ER 20 MEQ TBCR Take 20 mEq by mouth daily. 108 tablet 3  . spironolactone (ALDACTONE) 25 MG tablet Take 1 tablet (25 mg total) by mouth daily. 90 tablet 3   No current facility-administered medications for this visit.    REVIEW OF SYSTEMS:    10 Point review of Systems was done is negative except as noted above.  PHYSICAL EXAMINATION: ECOG PERFORMANCE STATUS: 3 - Symptomatic, >50% confined to bed  . Filed Vitals:   04/26/15 0849    Height: 5' 4" (1.626 m)  Weight: 216 lb 3.2 oz (98.068 kg)   Filed Weights   04/26/15 0849  Weight: 216 lb 3.2 oz (98.068 kg)   .Body mass index is 37.09 kg/(m^2).  GENERAL:alert, in no acute distress and comfortable on [place] oxygen SKIN: skin color, texture, turgor are normal, no rashes or significant lesions EYES: normal, conjunctiva are pink and non-injected, sclera clear OROPHARYNX:no exudate, no erythema and lips, buccal mucosa, and tongue normal  NECK: supple, no JVD, thyroid normal size, non-tender, without nodularity LYMPH:  no palpable lymphadenopathy in the cervical, axillary or inguinal LUNGS: clear to auscultation with normal respiratory effort HEART: regular rate & rhythm,  no murmurs and no lower extremity edema ABDOMEN: abdomen soft, non-tender, normoactive bowel sounds  Musculoskeletal: no cyanosis of digits and no clubbing  PSYCH: alert & oriented x 3 with fluent speech NEURO: no focal motor/sensory deficits  LABORATORY DATA:  I have reviewed the data as listed  . CBC Latest Ref Rng 03/29/2015 03/23/2015 12/17/2014  WBC 3.9 - 10.3 10e3/uL 5.5 6.5 5.7  Hemoglobin 11.6 - 15.9 g/dL 9.6(L) 7.7(L) 10.4(L)  Hematocrit 34.8 - 46.6 % 30.7(L) 25.7(L) 32.4(L)  Platelets 145 - 400 10e3/uL 125(L) 152 120.0(L)     . CMP Latest Ref Rng 04/21/2015 03/29/2015 02/16/2015  Glucose 65 - 99 mg/dL 116(H) 94 111(H)  BUN 6 - 20 mg/dL 31(H) 38.0(H) 50(H)  Creatinine 0.44 - 1.00 mg/dL 2.51(H) 2.1(H) 2.63(H)  Sodium 135 - 145 mmol/L 140 146(H) 140  Potassium 3.5 - 5.1 mmol/L 4.6 4.7 4.9  Chloride 101 - 111 mmol/L 108 - 106  CO2 22 - 32 mmol/L _0 Calcium 8.9 - 10.3 mg/dL 10.2 10.4 9.8  Total Protein 6.4 - 8.3 g/dL - 7.4 -  Total Bilirubin 0.20 - 1.20 mg/dL - 0.30 -  Alkaline Phos 40 - 150 U/L - 64 -  AST 5 - 34 U/L - 16 -  ALT 0 - 55 U/L - 14 -   Component     Latest Ref Rng 03/29/2015 04/19/2015  IgG (Immunoglobin G), Serum     690 - 1700 mg/dL  1670  IgA     69 - 380  mg/dL  404 (H)  IgM, Serum     52 - 322 mg/dL  95  Immunofix Electr Int       *  Total Protein, Serum Electrophoresis     6.1 - 8.1 g/dL  7.2  Albumin ELP     3.8 - 4.8 g/dL  3.6 (L)  Alpha-1 Glubulin     0.2 - 0.3  g/dL  0.4 (H)  Alpha-2 Globulin     0.5 - 0.9 g/dL  0.6  Beta Globulin     0.4 - 0.6 g/dL  0.5  Beta 2     0.2 - 0.5 g/dL  0.5  Gamma Globulin     0.8 - 1.7 g/dL  1.6  Abnormal Protein Band1       NOT DET  SPE Interp.       *  COMMENT (PROTEIN ELECTROPHOR)       *  Abnormal Protein Band2       NOT DET  Abnormal Protein Band3       NOT DET  Iron     41 - 142 ug/dL  35 (L)  TIBC     236 - 444 ug/dL  330  UIBC     120 - 384 ug/dL  295  %SAT     21 - 57 %  11 (L)  CRP     <0.60 mg/dL 1.3 (H)   Rhuematoid fact SerPl-aCnc     <=14 IU/mL 21 (H)   Erythropoietin     2.6 - 18.5 mIU/mL 37.3 (H)   LDH     125 - 245 U/L 308 (H)   Haptoglobin     43 - 212 mg/dL 58   Vitamin B-12     211 - 911 pg/mL 672   RBC Folate     >280 ng/mL 1245   Ferritin     9 - 269 ng/ml  36     RADIOGRAPHIC STUDIES: I have personally reviewed the radiological images as listed and agreed with the findings in the report. No results found.  ASSESSMENT & PLAN:   69 year old female with multiple medical comorbidities including hypertension, COPD, CHF, gout, rheumatoid arthritis, moderate mitral regurgitation with  #1 Normocytic Normochromic anemia. Patient appears to be running a baseline hemoglobin of 9-10 at least since January 2016. MCV has been in the low 90s. Recently the patient's hemoglobin dropped to 7.7 with a bump in the MCV 100.8 suggesting a reticulocyte response. The acute drop could be from acute blood loss versus hemolysis. Currently patient has no evidence of hemolysis with a normal bilirubin level, normal haptoglobin borderline elevated LDH in the setting of recent transfusion. Patient reports no clinically evident overt bleeding.  Her anemia is likely  multifactorial from her chronic kidney disease plus rheumatoid arthritis. Her allopurinol and plaquenil could certainly be an additional factors especially if there is worsening renal function. 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 SPEP with no monoclonal protein. No clinical evidence of splenomegaly to suggest overt hypersplenism.  #2 Mild thrombocytopenia #2 rheumatoid arthritis #3 chronic kidney disease #4 pulmonary hypertension #5 CHF  Plan -We will replace the patient's iron to a ferritin of more than 100 to treat functional iron deficiency due to chronic inflammation. Also any consideration for use of  ESA would need ferritin levels >100 -Erythropoietin level suggests that if she gets more anemic again we will likely treat her with Aranesp to treat anemia of chronic disease due to CKD and rheumatoid arthritis as well as likely element of MDS. --Consider reduction of allopurinol dose if gout is stable -We'll consider possible bone marrow biopsy if she has worsening cytopenias. -Continue optimal treatment of rheumatoid arthritis. Inflammatory markers and rheumatoid factor not impressively elevated. -Continue follow-up with primary care physician for other medical cares.  Return to care with Dr. Irene Limbo in 2 month with CBC, CMP and ferritin,  iron profile All of the patients questions were answered with apparent satisfaction. The patient knows to call the clinic with any problems, questions or concerns.  I spent 25 minutes counseling the patient face to face. The total time spent in the appointment was 30 minutes and more than 50% was on counseling and direct patient cares.    Sullivan Lone MD [place] AAHIVMS [facility] Hematology/Oncology Physician [facility]  (Office):       [phone] (Work cell):  [phone] (Fax):           [phone]  04/26/2015 8:54 AM

## 2015-05-02 ENCOUNTER — Other Ambulatory Visit (HOSPITAL_BASED_OUTPATIENT_CLINIC_OR_DEPARTMENT_OTHER): Payer: Medicare Other

## 2015-05-02 ENCOUNTER — Ambulatory Visit (HOSPITAL_BASED_OUTPATIENT_CLINIC_OR_DEPARTMENT_OTHER): Payer: Medicare Other

## 2015-05-02 VITALS — BP 117/55 | HR 58 | Temp 98.2°F | Resp 20

## 2015-05-02 DIAGNOSIS — D638 Anemia in other chronic diseases classified elsewhere: Secondary | ICD-10-CM

## 2015-05-02 DIAGNOSIS — N183 Chronic kidney disease, stage 3 (moderate): Secondary | ICD-10-CM

## 2015-05-02 DIAGNOSIS — D649 Anemia, unspecified: Secondary | ICD-10-CM

## 2015-05-02 DIAGNOSIS — D631 Anemia in chronic kidney disease: Secondary | ICD-10-CM

## 2015-05-02 LAB — CBC & DIFF AND RETIC
BASO%: 0.6 % (ref 0.0–2.0)
Basophils Absolute: 0 10*3/uL (ref 0.0–0.1)
EOS ABS: 0.2 10*3/uL (ref 0.0–0.5)
EOS%: 3.8 % (ref 0.0–7.0)
HCT: 34.1 % — ABNORMAL LOW (ref 34.8–46.6)
HEMOGLOBIN: 10.1 g/dL — AB (ref 11.6–15.9)
IMMATURE RETIC FRACT: 2 % (ref 1.60–10.00)
LYMPH#: 1 10*3/uL (ref 0.9–3.3)
LYMPH%: 18 % (ref 14.0–49.7)
MCH: 28.7 pg (ref 25.1–34.0)
MCHC: 29.6 g/dL — ABNORMAL LOW (ref 31.5–36.0)
MCV: 96.9 fL (ref 79.5–101.0)
MONO#: 0.4 10*3/uL (ref 0.1–0.9)
MONO%: 7.7 % (ref 0.0–14.0)
NEUT%: 69.9 % (ref 38.4–76.8)
NEUTROS ABS: 3.7 10*3/uL (ref 1.5–6.5)
Platelets: 126 10*3/uL — ABNORMAL LOW (ref 145–400)
RBC: 3.52 10*6/uL — AB (ref 3.70–5.45)
RDW: 15.5 % — AB (ref 11.2–14.5)
RETIC %: 0.98 % (ref 0.70–2.10)
RETIC CT ABS: 34.5 10*3/uL (ref 33.70–90.70)
WBC: 5.3 10*3/uL (ref 3.9–10.3)

## 2015-05-02 LAB — BASIC METABOLIC PANEL (CC13)
Anion Gap: 7 mEq/L (ref 3–11)
BUN: 35.8 mg/dL — ABNORMAL HIGH (ref 7.0–26.0)
CHLORIDE: 107 meq/L (ref 98–109)
CO2: 28 meq/L (ref 22–29)
CREATININE: 2.3 mg/dL — AB (ref 0.6–1.1)
Calcium: 10.6 mg/dL — ABNORMAL HIGH (ref 8.4–10.4)
EGFR: 25 mL/min/{1.73_m2} — AB (ref >90)
Glucose: 105 mg/dl (ref 70–140)
Potassium: 4.5 mEq/L (ref 3.5–5.1)
SODIUM: 143 meq/L (ref 136–145)

## 2015-05-02 MED ORDER — SODIUM CHLORIDE 0.9 % IV SOLN
510.0000 mg | Freq: Once | INTRAVENOUS | Status: AC
  Administered 2015-05-02: 510 mg via INTRAVENOUS
  Filled 2015-05-02: qty 17

## 2015-05-02 MED ORDER — SODIUM CHLORIDE 0.9 % IV SOLN
Freq: Once | INTRAVENOUS | Status: AC
  Administered 2015-05-02: 13:00:00 via INTRAVENOUS

## 2015-05-02 NOTE — Progress Notes (Signed)
Patient arrived with portable O2 at 4L.

## 2015-05-02 NOTE — Patient Instructions (Signed)

## 2015-05-06 ENCOUNTER — Telehealth: Payer: Self-pay | Admitting: Emergency Medicine

## 2015-05-06 NOTE — Telephone Encounter (Signed)
Called [facility] and they are now closed. wcb Monday AM

## 2015-05-09 ENCOUNTER — Ambulatory Visit (HOSPITAL_BASED_OUTPATIENT_CLINIC_OR_DEPARTMENT_OTHER): Payer: Medicare Other

## 2015-05-09 VITALS — BP 108/51 | HR 55 | Temp 98.2°F | Resp 20

## 2015-05-09 DIAGNOSIS — D649 Anemia, unspecified: Secondary | ICD-10-CM

## 2015-05-09 DIAGNOSIS — D638 Anemia in other chronic diseases classified elsewhere: Secondary | ICD-10-CM

## 2015-05-09 MED ORDER — SODIUM CHLORIDE 0.9 % IV SOLN
510.0000 mg | Freq: Once | INTRAVENOUS | Status: AC
  Administered 2015-05-09: 510 mg via INTRAVENOUS
  Filled 2015-05-09: qty 17

## 2015-05-09 MED ORDER — SODIUM CHLORIDE 0.9 % IV SOLN
Freq: Once | INTRAVENOUS | Status: AC
  Administered 2015-05-09: 11:00:00 via INTRAVENOUS

## 2015-05-09 NOTE — Progress Notes (Signed)
Pt monitored for 22 mins post [facility]. Pt refused to stay entire 30 minutes due to transportation. Pt and VS stable at time of discharge.

## 2015-05-09 NOTE — Telephone Encounter (Signed)
Dr. Lamonte Sakai, please advise per Dr. Gustavus Bryant recommendation.

## 2015-05-09 NOTE — Telephone Encounter (Signed)
There is no indication she needs this medication and in fact in may be causing her harm - I would rec it be stopped but certainly cannot fill out paperwork trying to justify it (in fact the record would argue it's contraindicated in her situation)  Defer final call on this to [facility] or offer to send to [facility] for a second opinion

## 2015-05-09 NOTE — Patient Instructions (Signed)

## 2015-05-09 NOTE — Telephone Encounter (Signed)
Called and spoke to [place] at [facility] and was advised the pt's Opsumit is needing a PA. Pt last seen in 01/2015 and was advised she does not need any pulmonary medications at this time.   Dr. Melvyn Novas please advise if this medication needs a PA or does this need to be done through her PCP.   ------------------------------------------------------- If PA needs to be done; ID #: [id number], number to call: [phone].

## 2015-05-09 NOTE — Telephone Encounter (Signed)
lmtcb X1 for pt to make aware of MW's recs.  

## 2015-05-10 NOTE — Telephone Encounter (Signed)
She has muktifactorial secondary PAH due to RA, some contribution of both obstructive and more-so restrictive lung disease with associated hypoxemia. She has been on targeted PAh meds and an attempt in the past to d/c caused increased dyspnea and edema. This is what prompted a change to Opsumit. We have discussed a trial off of this medication, but have not decided to stop it to date. If we do so it will need to be with good hemodynamic data (best would be PA cath) both before and after the change.   As such she needs to stay on the Opsumit for now. If the PA needs my attention then I will sign, let me know.

## 2015-05-10 NOTE — Telephone Encounter (Signed)
RB is on vacation this week. He will not respond until next week.

## 2015-05-12 ENCOUNTER — Other Ambulatory Visit (HOSPITAL_COMMUNITY): Payer: Self-pay | Admitting: Cardiology

## 2015-05-19 ENCOUNTER — Telehealth: Payer: Self-pay | Admitting: Emergency Medicine

## 2015-05-19 NOTE — Telephone Encounter (Signed)
I spoke with Elder Love at [facility]  She is calling to check the status of PA for Opsumit  I checked RB's lookat and see nothing on this pt  She will refax this, and states this is urgent that he signs this ASAP b/c the last PA that they faxed was initiated has expired Will await fax

## 2015-05-19 NOTE — Telephone Encounter (Signed)
Still have not received  Will check again later

## 2015-05-20 NOTE — Telephone Encounter (Signed)
lmtcb for [employer].

## 2015-05-20 NOTE — Telephone Encounter (Signed)
Initiated PA for Opsumit thru Cover My Meds.  Key: [id number] Sent to [employer] for review.  Will await response.

## 2015-05-23 ENCOUNTER — Ambulatory Visit (HOSPITAL_COMMUNITY)
Admission: RE | Admit: 2015-05-23 | Discharge: 2015-05-23 | Disposition: A | Discharge status: Home / Self Care | Payer: Medicare Other | Source: Ambulatory Visit | Attending: Cardiology | Admitting: Cardiology

## 2015-05-23 VITALS — BP 98/64 | HR 55 | Wt 214.0 lb

## 2015-05-23 DIAGNOSIS — I5032 Chronic diastolic (congestive) heart failure: Secondary | ICD-10-CM | POA: Diagnosis not present

## 2015-05-23 DIAGNOSIS — Z79899 Other long term (current) drug therapy: Secondary | ICD-10-CM | POA: Insufficient documentation

## 2015-05-23 DIAGNOSIS — J9610 Chronic respiratory failure, unspecified whether with hypoxia or hypercapnia: Secondary | ICD-10-CM | POA: Insufficient documentation

## 2015-05-23 DIAGNOSIS — G4733 Obstructive sleep apnea (adult) (pediatric): Secondary | ICD-10-CM | POA: Insufficient documentation

## 2015-05-23 DIAGNOSIS — I340 Nonrheumatic mitral (valve) insufficiency: Secondary | ICD-10-CM | POA: Diagnosis not present

## 2015-05-23 DIAGNOSIS — M069 Rheumatoid arthritis, unspecified: Secondary | ICD-10-CM | POA: Insufficient documentation

## 2015-05-23 DIAGNOSIS — N183 Chronic kidney disease, stage 3 unspecified: Secondary | ICD-10-CM

## 2015-05-23 DIAGNOSIS — I129 Hypertensive chronic kidney disease with stage 1 through stage 4 chronic kidney disease, or unspecified chronic kidney disease: Secondary | ICD-10-CM | POA: Diagnosis not present

## 2015-05-23 DIAGNOSIS — N184 Chronic kidney disease, stage 4 (severe): Secondary | ICD-10-CM | POA: Insufficient documentation

## 2015-05-23 DIAGNOSIS — I7101 Dissection of thoracic aorta: Secondary | ICD-10-CM | POA: Insufficient documentation

## 2015-05-23 DIAGNOSIS — Z9981 Dependence on supplemental oxygen: Secondary | ICD-10-CM | POA: Diagnosis not present

## 2015-05-23 DIAGNOSIS — Z87891 Personal history of nicotine dependence: Secondary | ICD-10-CM | POA: Insufficient documentation

## 2015-05-23 DIAGNOSIS — Z833 Family history of diabetes mellitus: Secondary | ICD-10-CM | POA: Diagnosis not present

## 2015-05-23 DIAGNOSIS — D649 Anemia, unspecified: Secondary | ICD-10-CM | POA: Diagnosis not present

## 2015-05-23 DIAGNOSIS — J449 Chronic obstructive pulmonary disease, unspecified: Secondary | ICD-10-CM | POA: Diagnosis not present

## 2015-05-23 DIAGNOSIS — IMO0002 Reserved for concepts with insufficient information to code with codable children: Secondary | ICD-10-CM

## 2015-05-23 DIAGNOSIS — Z8249 Family history of ischemic heart disease and other diseases of the circulatory system: Secondary | ICD-10-CM | POA: Insufficient documentation

## 2015-05-23 DIAGNOSIS — I272 Other secondary pulmonary hypertension: Secondary | ICD-10-CM | POA: Diagnosis not present

## 2015-05-23 MED ORDER — ASPIRIN EC 81 MG PO TBEC: 81.0000 mg | DELAYED_RELEASE_TABLET | Freq: Every day | ORAL | Status: DC

## 2015-05-23 NOTE — Patient Instructions (Signed)
Start Aspirin 81 mg daily  Referal to [employer] with Dr Joelyn Oms.   Follow up in 3 months  Do the following things EVERYDAY: 1. Weigh yourself in the morning before breakfast. Write it down and keep it in a log. 2. Take your medicines as prescribed 3. Eat low salt foods-Limit salt (sodium) to 2000 mg per day.  4. Stay as active as you can everyday 5. Limit all fluids for the day to less than 2 liters

## 2015-05-23 NOTE — Progress Notes (Signed)
Advanced Heart Failure Medication Review by a Pharmacist  Does the patient  feel that his/her medications are working for him/her?  yes  Has the patient been experiencing any side effects to the medications prescribed?  no  Does the patient measure his/her own blood pressure or blood glucose at home?  yes   Does the patient have any problems obtaining medications due to transportation or finances?   no  Understanding of regimen: good Understanding of indications: good Potential of compliance: good Patient understands to avoid NSAIDs. Patient understands to avoid decongestants.  Issues to address at subsequent visits: None    Pharmacist comments:  April Mueller is a pleasant 69 yo F presenting with a current medication list. She reports excellent compliance with all of her medications. She did not have any specific medication-related questions or concerns for me at this time.   [name]. Velva Harman, PharmD, BCPS, CPP Clinical Pharmacist Pager: [phone] Phone: [phone] 05/23/2015 9:07 AM    Time with patient: 4 minutes  Preparation and documentation time: 1 minute  Total time: 5 minutes

## 2015-05-23 NOTE — Progress Notes (Signed)
Patient ID: April Mueller, female   DOB: 1946-03-28, 69 y.o.   MRN: [id number]   HPI  PCP: Dr Harrington Challenger Pulmonary: Dr Byrum/Dr Melvyn Novas   April Mueller is a 69 yo with following PMHx  1) COPD       --former smoker (28 pk-yrs)       --on home O2 - 4L/min rest, up to 5L/min exertion.        --spirometry 6/13 in [place] = 0.85L FVC = 1.3L no DLCO measured. FEV1 in 2014 = 0.73        --CT chest 10/14 - moderate centrilobular emphysema. No pulm fibrosis but there was evidence for post-infectious/inflammatory scarring. Stable Asc Ao repair       --PFTs (11/14) FVC 46%, FEV1 0.8L (40%), ratio 87%, FEF 25-75% 0.44L DLCO 22%, TLC 50% => PFTs in 06/2013 did NOT suggest significant airflow obstruction according to Dr Gustavus Bryant last note despite "moderate emphysema" on CT.        --PFTs (6/16) were abnormal with severe obstruction, severe restriction, severely decreased DLCO.  Reviewed with Dr Melvyn Novas => he thinks obstruction is actually fairly minimal and that the restriction is due to body habitus.   2) Obesity 3) RA       --on plaquenil, sees Dr Lenna Gilford.  4) Type A aortic dissection s/p repair 2005     --c/b renal failure requiring HD x 3 months 5) PAH - previously treated in [place].  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      --on macitentan (revatio stopped 10/14).     --V/Q scan 11/14 no chronic PE     -- 2015 Tracleer stopped and started on macitentan 04/2015.  6) OSA 7) Diastolic CHF    --ECHO 0/08 EF 55-60% RV mildly dilated with normal function. Mild to moderate posterior MR. RVSP 56. Probable restrictive diastolic filling pattern    --ECHO 9/15 EF 60-65% RV mildly dilated with normal function. Mild to moderate posterior MR. RVSP 40. Ao Root ok    --TEE 6/16 with EF 55-60%, D-shaped interventricular septum suggestive of RV pressure/volume overload, RV mildly dilated with normal systolic function, peak RV-RA gradient 57 mmHg, very eccentric posteriorly-directed mitral regurgitation, possibly severe, etiology may be a very small  area of prolapse on the anterior leaflet, s/p ascending aorta repair with residual dissection flap in the descending thoracic aorta.  8) CKD 9) RHC 08/18/2014: No role for pulmonary vasodilators.  RA = 14 RV = 62/3/15 PA = 63/21 (38) PCW = mean 23 v waves to 40 Fick cardiac output/index = 6.3/3.2 Thermodilution CO/CI = 6.1/3.1, PVR = 2.0 WU, FA sat = 91% PA sat = 57%, 62% 10) Mitral regurgitation: Possibly severe by 6/16 TEE, very eccentric.  Not good candidate for percutaneous MV clip (seen at [facility]).  11) Anemia: Suspect from chronic disease/renal disease.  Getting iron infusions.  Moved back from [place] and saw Dr. Lamonte Sakai. In [place] was followed by cardiology and pulmonary. Started on Tracleer in 2006. Revatio was added, however was discontinued without any change in her symptoms. Stopped smoking in January 2005 when she had aortic aneurysm repair. Smoked 1.5-2 ppd x 15 - 20 years.  Now on macitentan.  I had her do a TEE in 6/16.  She had a loud MR murmur.  TEE showed very eccentric, posteriorly-directed MR that may be from subtle prolapse of the anterior mitral leaflet.  I am concerned that the MR could be severe but very difficult to fully visualize.  She additionally had PFTs done  that were abnormal, but according to her pulmonologist Melvyn Novas), the obstruction was really only minimal and the restriction was primarily related to body habitus.  He does not think that we can explain her hypoxemia and dyspnea primarily from parenchymal lung pathology.  I sent her to [facility] for evaluation for percutaneous mitral valve clip.  They did not think that she was a good candidate.   She has been anemic and received 1 unit PRBCs in 8/16.  She saw GI and was noted to be heme negative, no scopes were done (heme negative and high risk). She has had iron infusions.   Since increasing Lasix at last appointment, she has been feeling better.  She can now walk with 4-5 L oxygen rather than 6 L oxygen.  She can walk 50-75 yards before  getting short of breath.  Some dyspnea with heavy housework.  She stopped Opsumit about a month ago and feels like she is doing better off it.    Labs 02/05/13 K 3.8 Creatinine 1.37 05/08/13 K 3.8, Creatinine 1.58 11/14 K 4.1, creatinine 1.82 08/09/2014: K 4.0 Creatinine 1.45  5/16: K 3.7, creatinine 1.76, BNP 447 6/16: K 5 => 4.6, creatinine 2.09 => 2.13, BNP 291 8/16: K 4.7, creatinine 2.1, HCT 30.7 9/16: K 4.5, creatinine 2.3, HCT 34.1  Past Medical History  Diagnosis Date  . Hypertension   . Pulmonary HTN   . Aortic aneurysm   . Sleep apnea     wears CPAP  . CKD (chronic kidney disease) stage 3, GFR 30-59 ml/min   . Anemia 03/23/15    transfusion  . Arthritis   . CHF (congestive heart failure)   . Gout   . Rheumatoid arthritis(714.0) 11/06/2012   Current Outpatient Prescriptions on File Prior to Encounter  Medication Sig Dispense Refill  . allopurinol (ZYLOPRIM) 300 MG tablet Take 300 mg by mouth daily.    [name] ALPRAZolam (XANAX) 0.25 MG tablet Take 0.25 mg by mouth as needed for sleep.    [name] amLODipine (NORVASC) 5 MG tablet Take 0.5 tablets (2.5 mg total) by mouth daily. 90 tablet 3  . atorvastatin (LIPITOR) 10 MG tablet Take 1 tablet (10 mg total) by mouth daily. 90 tablet 3  . b complex vitamins tablet Take 1 tablet by mouth daily.    . ferrous sulfate (SLOW FE) 160 (50 FE) MG TBCR SR tablet Take 160 mg by mouth daily.    [name] FLUOCINOLONE ACETONIDE BODY 0.01 % OIL Apply 1 mL topically 2 (two) times daily as needed (for flares).   3  . hydroxychloroquine (PLAQUENIL) 200 MG tablet Take 200 mg by mouth 2 (two) times daily.    [name] ketoconazole (NIZORAL) 2 % shampoo Apply 1 application topically 2 (two) times a week. Leave on 5 minutes before rinsing out  3  . metoprolol succinate (TOPROL-XL) 100 MG 24 hr tablet Take 1 tablet (100 mg total) by mouth 2 (two) times daily. 180 tablet 2  . Omega-3 Fatty Acids (FISH OIL) 1200 MG CAPS Take 1,200 mg by mouth daily.    . OXYGEN Inhale 6 L into the  lungs continuous.    . Potassium Chloride ER 20 MEQ TBCR Take 20 mEq by mouth daily. 108 tablet 3  . spironolactone (ALDACTONE) 25 MG tablet Take 1 tablet (25 mg total) by mouth daily. 90 tablet 3   No current facility-administered medications on file prior to encounter.   Social History   Social History  . Marital Status: Legally Separated  Spouse Name: N/A  . Number of Children: 1  . Years of Education: N/A   Occupational History  . Disability     Office Work   Social History Main Topics  . Smoking status: Former Smoker -- 1.50 packs/day for 14 years    Types: Cigarettes    Quit date: 08/09/2003  . Smokeless tobacco: Never Used  . Alcohol Use: No  . Drug Use: No  . Sexual Activity: Not Currently   Other Topics Concern  . Not on file   Social History Narrative   Family History  Problem Relation Age of Onset  . Heart attack Mother   . Prostate cancer Father   . Diabetes Brother   . Kidney failure Brother     Filed Vitals:   05/23/15 0905  BP: 98/64  Pulse: 55  Weight: 214 lb (97.07 kg)   Physical Exam Gen: Pleasant, well-nourished, in no distress, normal affect; on O2 by [place].  HEENT: normal Neck: JVP 8 cm. Lungs: Crackles at bases bilaterally.  Cardiovascular: well healed sternotomy scar  PMI normal. RRR, heart sounds normal, 2/6 HSM apex.  Extremities: No deformities, no cyanosis or clubbing, 1+ edema to knees bilaterally.  Neuro: alert, non focal. Cranial nerves intact. Moves all 4 extremities without difficulty Skin: Warm, no lesions or rashes  Assessment/Plan: 1. 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  This appeared to be primarily pulmonary venous hypertension.  Interestingly, there were very prominent V waves in the PCWP tracing. On exam, she has a prominent MR murmur.  I was concerned that significant MR may  be playing a major role in her symptomatology. I did a TEE in 6/16 showing very eccentric, posteriorly-directed mitral regurgitation possibly from subtle anterior leaflet prolapse.  I think that the MR could be severe but is difficult to fully visualize.  Her pulmonologist does not think that lung parenchymal disease adequately explains her degree of hypoxemia and dyspnea. She was seen at [facility] for consideration of percutaneous MV clip and was not thought to be a good candidate => concern that MV disease may not actually be severe and concern that her dyspnea is coming from something else and would not be improved by MV clipping.  NYHA class III symptoms but improved.  Volume looks better.  Weight is down 4 lbs.  - Continue Lasix 80 mg bid, recent BMET was stable.  2. Chronic respiratory failure:  She is on 4-5 L home oxygen by [place].  I have been concerned that she does have significant underlying lung parenchymal disease.  However, her pulmonologist (Dr Melvyn Novas),  did not think she had a significant degree of obstructive lung disease on last PFTs done this year.  He thought that the restriction was primarily due to her body habitus.  3. OSA: Continue CPAP nightly.  4. CKD: Stage IV.  Will refer to nephrology for evaluation.  5. COPD: See #2 above, per pulmonary.     6. Aortic dissection s/p repair 2005: Although complete records are not available, it appears that the patient developed a Type A dissection in 2005 with ascending aorta repair (can see the surgically repaired ascending aorta well on TEE).  She has residual dissection in the descending thoracic aorta.  7. Pulmonary hypertension: I am concerned that this is primarily WHO group 2 (pulmonary venous hypertension) and group 3 (PH due to intrinsic lung disease) rather than primarily  WHO group 1 PAH. RHC earlier this year showed low PVR.  She is now off macitentan and actually feels better off it.  If symptoms worsen, will need repeat RHC.   8. Mitral  regurgitation:  TEE concerning for possibly severe, eccentric mitral regurgitation, most likely due to subtle prolapse of the anterior mitral valve leaflet.  I have been concerned that the MR is playing a role in her CHF. She had prominent V-waves on RHC earlier this year, and she has a loud mitral area murmur.  I do not think that she is a good candidate for open MV surgery. I had her evaluated at [facility] for possible percutaneous mitral clipping.  She was turned down for this as her MR was not clearly severe and fixing it may not help her hypoxemia appreciably.   9. Anemia: Suspect anemia of chronic disease/renal disease.  No evidence for GI bleeding.  Ok to restart ASA 81 daily.   [name] 05/23/2015

## 2015-05-23 NOTE — Telephone Encounter (Signed)
Opsumit approved until 05/20/2016. Pharmacy informed. Nothing further needed.

## 2015-06-03 ENCOUNTER — Other Ambulatory Visit: Payer: Self-pay | Admitting: *Deleted

## 2015-06-14 ENCOUNTER — Encounter: Payer: Self-pay | Admitting: Hematology

## 2015-06-14 NOTE — Progress Notes (Signed)
I left message for jackie at [facility] [phone] ref# [id number]

## 2015-06-15 ENCOUNTER — Encounter: Payer: Self-pay | Admitting: Hematology

## 2015-06-15 NOTE — Progress Notes (Signed)
Per Kennyth Lose at [facility]-she said date of service lab and visit ($360) needs to be reviewed. I sent email to [place] in coding to get reviewed. Kennyth Lose needs call back at [phone]-ref# [id number]

## 2015-06-21 ENCOUNTER — Other Ambulatory Visit (HOSPITAL_BASED_OUTPATIENT_CLINIC_OR_DEPARTMENT_OTHER): Payer: Medicare Other

## 2015-06-21 ENCOUNTER — Telehealth: Payer: Self-pay | Admitting: Hematology

## 2015-06-21 ENCOUNTER — Encounter: Payer: Self-pay | Admitting: Hematology

## 2015-06-21 ENCOUNTER — Ambulatory Visit (HOSPITAL_BASED_OUTPATIENT_CLINIC_OR_DEPARTMENT_OTHER): Payer: Medicare Other | Admitting: Hematology

## 2015-06-21 VITALS — BP 117/52 | HR 65 | Temp 98.3°F | Resp 19 | Ht 64.0 in | Wt 216.1 lb

## 2015-06-21 DIAGNOSIS — D649 Anemia, unspecified: Secondary | ICD-10-CM

## 2015-06-21 DIAGNOSIS — N189 Chronic kidney disease, unspecified: Secondary | ICD-10-CM | POA: Diagnosis not present

## 2015-06-21 DIAGNOSIS — I272 Other secondary pulmonary hypertension: Secondary | ICD-10-CM

## 2015-06-21 DIAGNOSIS — D638 Anemia in other chronic diseases classified elsewhere: Secondary | ICD-10-CM

## 2015-06-21 DIAGNOSIS — M069 Rheumatoid arthritis, unspecified: Secondary | ICD-10-CM | POA: Diagnosis not present

## 2015-06-21 DIAGNOSIS — D696 Thrombocytopenia, unspecified: Secondary | ICD-10-CM | POA: Diagnosis not present

## 2015-06-21 DIAGNOSIS — I509 Heart failure, unspecified: Secondary | ICD-10-CM

## 2015-06-21 LAB — CBC & DIFF AND RETIC
BASO%: 0.3 % (ref 0.0–2.0)
BASOS ABS: 0 10*3/uL (ref 0.0–0.1)
EOS ABS: 0.2 10*3/uL (ref 0.0–0.5)
EOS%: 2.3 % (ref 0.0–7.0)
HEMATOCRIT: 28.6 % — AB (ref 34.8–46.6)
HEMOGLOBIN: 8.4 g/dL — AB (ref 11.6–15.9)
IMMATURE RETIC FRACT: 10 % (ref 1.60–10.00)
LYMPH#: 1 10*3/uL (ref 0.9–3.3)
LYMPH%: 14.9 % (ref 14.0–49.7)
MCH: 30 pg (ref 25.1–34.0)
MCHC: 29.4 g/dL — AB (ref 31.5–36.0)
MCV: 102.1 fL — ABNORMAL HIGH (ref 79.5–101.0)
MONO#: 0.5 10*3/uL (ref 0.1–0.9)
MONO%: 7.6 % (ref 0.0–14.0)
NEUT#: 4.9 10*3/uL (ref 1.5–6.5)
NEUT%: 74.9 % (ref 38.4–76.8)
PLATELETS: 134 10*3/uL — AB (ref 145–400)
RBC: 2.8 10*6/uL — ABNORMAL LOW (ref 3.70–5.45)
RDW: 17.6 % — AB (ref 11.2–14.5)
RETIC %: 3.38 % — AB (ref 0.70–2.10)
RETIC CT ABS: 94.64 10*3/uL — AB (ref 33.70–90.70)
WBC: 6.5 10*3/uL (ref 3.9–10.3)

## 2015-06-21 LAB — COMPREHENSIVE METABOLIC PANEL (CC13)
ALBUMIN: 3.4 g/dL — AB (ref 3.5–5.0)
ALT: 12 U/L (ref 0–55)
ANION GAP: 9 meq/L (ref 3–11)
AST: 18 U/L (ref 5–34)
Alkaline Phosphatase: 71 U/L (ref 40–150)
BUN: 45.6 mg/dL — AB (ref 7.0–26.0)
CALCIUM: 10.7 mg/dL — AB (ref 8.4–10.4)
CO2: 25 meq/L (ref 22–29)
Chloride: 108 mEq/L (ref 98–109)
Creatinine: 2.8 mg/dL — ABNORMAL HIGH (ref 0.6–1.1)
EGFR: 19 mL/min/{1.73_m2} — ABNORMAL LOW (ref >90)
GLUCOSE: 113 mg/dL (ref 70–140)
Potassium: 5 mEq/L (ref 3.5–5.1)
Sodium: 142 mEq/L (ref 136–145)
TOTAL PROTEIN: 7.2 g/dL (ref 6.4–8.3)
Total Bilirubin: <0.3 mg/dL (ref 0.20–1.20)

## 2015-06-21 LAB — FERRITIN CHCC: FERRITIN: 134 ng/mL (ref 9–269)

## 2015-06-21 LAB — IRON AND TIBC CHCC
%SAT: 20 % — AB (ref 21–57)
Iron: 52 ug/dL (ref 41–142)
TIBC: 259 ug/dL (ref 236–444)
UIBC: 207 ug/dL (ref 120–384)

## 2015-06-21 NOTE — Telephone Encounter (Signed)
Gave and printed appt sched and av sfor pt for DEC and Jan 2017 °

## 2015-07-14 NOTE — Progress Notes (Signed)
[name]    HEMATOLOGY/ONCOLOGY CLINIC NOTE  Date of Service: .06/21/2015  Patient Care Team: Lona Kettle, MD as PCP - General (Family Medicine)  CHIEF COMPLAINTS/PURPOSE OF CONSULTATION: f/u for Anemia   HISTORY OF PRESENTING ILLNESS: Please see my initial consultation for details on initial presentation.  INTERVAL HISTORY  Ms Dearman is here for her scheduled follow-up for her anemia after having received IV iron. She notes no acute new symptoms. Feels that her breathing is better and that she feels her energy is better. Notes some hair loss. Hemoglobin still in the mid 8 range. Agreeable to considering Aranesp injections.   MEDICAL HISTORY:  Past Medical History  Diagnosis Date  . Hypertension   . Pulmonary HTN ([place])   . Aortic aneurysm ([place])   . Sleep apnea     wears CPAP  . CKD (chronic kidney disease) stage 3, GFR 30-59 ml/min   . Anemia 03/23/15    transfusion  . Arthritis   . CHF (congestive heart failure) ([place])   . Gout   . Rheumatoid arthritis(714.0) 11/06/2012    SURGICAL HISTORY: Past Surgical History  Procedure Laterality Date  . Abdominal aortic aneurysm repair    . Right heart catheterization N/A 08/18/2014    Procedure: RIGHT HEART CATH;  Surgeon: Jolaine Artist, MD;  Location[facility] C[facility] LAB;  Service: Cardiovascular;  Laterality: N/A;  . Tee without cardioversion N/A 01/05/2015    Procedure: TRANSESOPHAGEAL ECHOCARDIOGRAM (TEE);  Surgeon: Larey Dresser, MD;  Location: [place];  Service: Cardiovascular;  Laterality: N/A;  . Wisdom tooth extraction      SOCIAL HISTORY: Social History   Social History  . Marital Status: Legally Separated    Spouse Name: N/A  . Number of Children: 1  . Years of Education: N/A   Occupational History  . Disability     Office Work   Social History Main Topics  . Smoking status: Former Smoker -- 1.50 packs/day for 14 years    Types: Cigarettes    Quit date: 08/09/2003  . Smokeless tobacco: Never Used  . Alcohol  Use: No  . Drug Use: No  . Sexual Activity: Not Currently   Other Topics Concern  . Not on file   Social History Narrative    FAMILY HISTORY: Family History  Problem Relation Age of Onset  . Heart attack Mother   . Prostate cancer Father   . Diabetes Brother   . Kidney failure Brother     ALLERGIES:  has No Known Allergies.  MEDICATIONS:  Current Outpatient Prescriptions  Medication Sig Dispense Refill  . allopurinol (ZYLOPRIM) 300 MG tablet Take 300 mg by mouth daily.    [name] ALPRAZolam (XANAX) 0.25 MG tablet Take 0.25 mg by mouth as needed for sleep.    [name] amLODipine (NORVASC) 5 MG tablet Take 0.5 tablets (2.5 mg total) by mouth daily. 90 tablet 3  . aspirin EC 81 MG tablet Take 1 tablet (81 mg total) by mouth daily. 90 tablet 3  . atorvastatin (LIPITOR) 10 MG tablet Take 1 tablet (10 mg total) by mouth daily. 90 tablet 3  . b complex vitamins tablet Take 1 tablet by mouth daily.    . ferrous sulfate (SLOW FE) 160 (50 FE) MG TBCR SR tablet Take 160 mg by mouth daily.    [name] FLUAD 0.5 ML SUSY ADM 0.5ML IM UTD  0  . FLUOCINOLONE ACETONIDE BODY 0.01 % OIL Apply 1 mL topically 2 (two) times daily as needed (for flares).  3  . FLUOCINOLONE ACETONIDE SCALP 0.01 % OIL     . furosemide (LASIX) 40 MG tablet Take 40 mg by mouth 2 (two) times daily.    . hydroxychloroquine (PLAQUENIL) 200 MG tablet Take 200 mg by mouth 2 (two) times daily.    [name] ketoconazole (NIZORAL) 2 % shampoo Apply 1 application topically 2 (two) times a week. Leave on 5 minutes before rinsing out  3  . lidocaine (LIDODERM) 5 %     . metoprolol succinate (TOPROL-XL) 100 MG 24 hr tablet Take 1 tablet (100 mg total) by mouth 2 (two) times daily. 180 tablet 2  . Omega-3 Fatty Acids (FISH OIL) 1200 MG CAPS Take 1,200 mg by mouth daily.    . OXYGEN Inhale 6 L into the lungs continuous.    . Potassium Chloride ER 20 MEQ TBCR Take 20 mEq by mouth daily. 108 tablet 3  . potassium chloride SA (K-DUR,KLOR-CON) 20 MEQ tablet       . spironolactone (ALDACTONE) 25 MG tablet Take 1 tablet (25 mg total) by mouth daily. 90 tablet 3   No current facility-administered medications for this visit.    REVIEW OF SYSTEMS:    10 Point review of Systems was done is negative except as noted above.  PHYSICAL EXAMINATION: ECOG PERFORMANCE STATUS: 3 - Symptomatic, >50% confined to bed  . Filed Vitals:   06/21/15 0855  Height: _0  (1.626 m)  Weight: 216 lb 1.6 oz (98.022 kg)   Filed Weights   06/21/15 0855  Weight: 216 lb 1.6 oz (98.022 kg)   .Body mass index is 37.08 kg/(m^2).  GENERAL:alert, in no acute distress and comfortable on [place] oxygen SKIN: skin color, texture, turgor are normal, no rashes or significant lesions EYES: normal, conjunctiva are pink and non-injected, sclera clear OROPHARYNX:no exudate, no erythema and lips, buccal mucosa, and tongue normal  NECK: supple, no JVD, thyroid normal size, non-tender, without nodularity LYMPH:  no palpable lymphadenopathy in the cervical, axillary or inguinal LUNGS: clear to auscultation with normal respiratory effort HEART: regular rate & rhythm,  no murmurs and no lower extremity edema ABDOMEN: abdomen soft, non-tender, normoactive bowel sounds  Musculoskeletal: no cyanosis of digits and no clubbing  PSYCH: alert & oriented x 3 with fluent speech NEURO: no focal motor/sensory deficits  LABORATORY DATA:  I have reviewed the data as listed  . CBC Latest Ref Rng 06/21/2015 05/02/2015 03/29/2015  WBC 3.9 - 10.3 10e3/uL 6.5 5.3 5.5  Hemoglobin 11.6 - 15.9 g/dL 8.4(L) 10.1(L) 9.6(L)  Hematocrit 34.8 - 46.6 % 28.6(L) 34.1(L) 30.7(L)  Platelets 145 - 400 10e3/uL 134(L) 126(L) 125(L)     . CMP Latest Ref Rng 06/21/2015 05/02/2015 04/21/2015  Glucose 70 - 140 mg/dl 113 105 116(H)  BUN 7.0 - 26.0 mg/dL 45.6(H) 35.8(H) 31(H)  Creatinine 0.6 - 1.1 mg/dL 2.8(H) 2.3(H) 2.51(H)  Sodium 136 - 145 mEq/L 142 143 140  Potassium 3.5 - 5.1 mEq/L 5.0 4.5 4.6  Chloride 101 - 111  mmol/L - - 108  CO2 22 - 29 mEq/L _1 Calcium 8.4 - 10.4 mg/dL 10.7(H) 10.6(H) 10.2  Total Protein 6.4 - 8.3 g/dL 7.2 - -  Total Bilirubin 0.20 - 1.20 mg/dL <0.30 - -  Alkaline Phos 40 - 150 U/L 71 - -  AST 5 - 34 U/L 18 - -  ALT 0 - 55 U/L 12 - -   . Lab Results  Component Value Date   IRON 52 06/21/2015   TIBC  259 06/21/2015   IRONPCTSAT 20* 06/21/2015   (Iron and TIBC)  Lab Results  Component Value Date   FERRITIN 134 06/21/2015     Component     Latest Ref Rng 03/29/2015 04/19/2015  IgG (Immunoglobin G), Serum     690 - 1700 mg/dL  1670  IgA     69 - 380 mg/dL  404 (H)  IgM, Serum     52 - 322 mg/dL  95  Immunofix Electr Int       *  Total Protein, Serum Electrophoresis     6.1 - 8.1 g/dL  7.2  Albumin ELP     3.8 - 4.8 g/dL  3.6 (L)  Alpha-1 Glubulin     0.2 - 0.3 g/dL  0.4 (H)  Alpha-2 Globulin     0.5 - 0.9 g/dL  0.6  Beta Globulin     0.4 - 0.6 g/dL  0.5  Beta 2     0.2 - 0.5 g/dL  0.5  Gamma Globulin     0.8 - 1.7 g/dL  1.6  Abnormal Protein Band1       NOT DET  SPE Interp.       *  COMMENT (PROTEIN ELECTROPHOR)       *  Abnormal Protein Band2       NOT DET  Abnormal Protein Band3       NOT DET  Iron     41 - 142 ug/dL  35 (L)  TIBC     236 - 444 ug/dL  330  UIBC     120 - 384 ug/dL  295  %SAT     21 - 57 %  11 (L)  CRP     <0.60 mg/dL 1.3 (H)   Rhuematoid fact SerPl-aCnc     <=14 IU/mL 21 (H)   Erythropoietin     2.6 - 18.5 mIU/mL 37.3 (H)   LDH     125 - 245 U/L 308 (H)   Haptoglobin     43 - 212 mg/dL 58   Vitamin B-12     211 - 911 pg/mL 672   RBC Folate     >280 ng/mL 1245   Ferritin     9 - 269 ng/ml  36     RADIOGRAPHIC STUDIES: I have personally reviewed the radiological images as listed and agreed with the findings in the report. No results found.  ASSESSMENT & PLAN:   69 year old female with multiple medical comorbidities including hypertension, COPD, CHF, gout, rheumatoid arthritis, moderate mitral  regurgitation with  #1 Normocytic Normochromic anemia. Patient appears to be running a baseline hemoglobin of 9-10 at least since January 2016. MCV has been in the low 90s. Recently the patient's hemoglobin dropped to 7.7 with a bump in the MCV 100.8 suggesting a reticulocyte response. The acute drop could be from acute blood loss versus hemolysis. Currently patient has no evidence of hemolysis with a normal bilirubin level, normal haptoglobin borderline elevated LDH in the setting of recent transfusion. Patient reports no clinically evident overt bleeding.  Her anemia is likely multifactorial from her chronic kidney disease plus rheumatoid arthritis. Her allopurinol and plaquenil could certainly be an additional factors especially if there is worsening renal function. 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 SPEP with no monoclonal protein. No clinical evidence of splenomegaly to suggest overt hypersplenism.  ferritin level improved to more than 100 after IV Feraheme. 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 will increase dose as needed to target hemoglobin around 10-11 and hold for hemoglobin more than 11. -We'll consider possible bone marrow biopsy if she has worsening cytopenias. -Continue optimal treatment of rheumatoid arthritis. Inflammatory markers and rheumatoid factor not impressively elevated. -Continue follow-up with primary care physician for other medical cares.  Return to care with Dr. Irene Limbo in 2 month with CBC, CMP and ferritin, iron profile All of the patients questions were anto her apparent satisfaction. The patient knows to call the clinic with any problems, questions or concerns.  I  spent 15 minutes counseling the patient face to face. The total time spent in the appointment was 20 minutes and more than 50% was on counseling and direct patient cares.    Sullivan Lone MD [place] AAHIVMS [facility] Hematology/Oncology Physician [facility]  (Office):       [phone] (Work cell):  [phone] (Fax):           [phone]

## 2015-07-18 ENCOUNTER — Telehealth: Payer: Self-pay | Admitting: Emergency Medicine

## 2015-07-18 DIAGNOSIS — Z9989 Dependence on other enabling machines and devices: Principal | ICD-10-CM

## 2015-07-18 DIAGNOSIS — G4733 Obstructive sleep apnea (adult) (pediatric): Secondary | ICD-10-CM

## 2015-07-18 NOTE — Telephone Encounter (Signed)
Spoke with pt. She needs a new CPAP machine. Order will be sent to [place]. ROV has been scheduled for 09/27/15 at 3pm per the pt's request. Nothing further was needed.

## 2015-07-19 ENCOUNTER — Other Ambulatory Visit (HOSPITAL_BASED_OUTPATIENT_CLINIC_OR_DEPARTMENT_OTHER): Payer: Medicare Other

## 2015-07-19 ENCOUNTER — Other Ambulatory Visit: Payer: Self-pay | Admitting: Hematology

## 2015-07-19 ENCOUNTER — Ambulatory Visit (HOSPITAL_BASED_OUTPATIENT_CLINIC_OR_DEPARTMENT_OTHER): Payer: Medicare Other

## 2015-07-19 VITALS — BP 106/50 | HR 58 | Temp 97.8°F

## 2015-07-19 DIAGNOSIS — D638 Anemia in other chronic diseases classified elsewhere: Secondary | ICD-10-CM

## 2015-07-19 DIAGNOSIS — N189 Chronic kidney disease, unspecified: Secondary | ICD-10-CM

## 2015-07-19 DIAGNOSIS — D649 Anemia, unspecified: Secondary | ICD-10-CM | POA: Diagnosis not present

## 2015-07-19 LAB — CBC & DIFF AND RETIC
BASO%: 0.7 % (ref 0.0–2.0)
Basophils Absolute: 0 10*3/uL (ref 0.0–0.1)
EOS%: 2.3 % (ref 0.0–7.0)
Eosinophils Absolute: 0.2 10*3/uL (ref 0.0–0.5)
HCT: 29.8 % — ABNORMAL LOW (ref 34.8–46.6)
HGB: 9.2 g/dL — ABNORMAL LOW (ref 11.6–15.9)
Immature Retic Fract: 3.9 % (ref 1.60–10.00)
LYMPH#: 1.1 10*3/uL (ref 0.9–3.3)
LYMPH%: 15.2 % (ref 14.0–49.7)
MCH: 30.1 pg (ref 25.1–34.0)
MCHC: 30.7 g/dL — AB (ref 31.5–36.0)
MCV: 98 fL (ref 79.5–101.0)
MONO#: 0.5 10*3/uL (ref 0.1–0.9)
MONO%: 7.7 % (ref 0.0–14.0)
NEUT#: 5.2 10*3/uL (ref 1.5–6.5)
NEUT%: 74.1 % (ref 38.4–76.8)
Platelets: 130 10*3/uL — ABNORMAL LOW (ref 145–400)
RBC: 3.04 10*6/uL — AB (ref 3.70–5.45)
RDW: 15.8 % — AB (ref 11.2–14.5)
RETIC %: 1.74 % (ref 0.70–2.10)
RETIC CT ABS: 52.9 10*3/uL (ref 33.70–90.70)
WBC: 7 10*3/uL (ref 3.9–10.3)

## 2015-07-19 MED ORDER — DARBEPOETIN ALFA 40 MCG/0.4ML IJ SOSY
40.0000 ug | PREFILLED_SYRINGE | Freq: Once | INTRAMUSCULAR | Status: AC
  Administered 2015-07-19: 40 ug via SUBCUTANEOUS
  Filled 2015-07-19: qty 0.4

## 2015-07-19 MED ORDER — DARBEPOETIN ALFA 100 MCG/0.5ML IJ SOSY: 50.0000 ug | PREFILLED_SYRINGE | Freq: Once | INTRAMUSCULAR | Status: DC

## 2015-07-19 MED ORDER — DARBEPOETIN ALFA 100 MCG/0.5ML IJ SOSY: 40.0000 ug | PREFILLED_SYRINGE | Freq: Once | INTRAMUSCULAR | Status: DC

## 2015-07-19 NOTE — Patient Instructions (Signed)
Darbepoetin Alfa injection What is this medicine? DARBEPOETIN ALFA (dar be POE e tin AL fa) helps your body make more red blood cells. It is used to treat anemia caused by chronic kidney failure and chemotherapy. This medicine may be used for other purposes; ask your health care provider or pharmacist if you have questions. What should I tell my health care provider before I take this medicine? They need to know if you have any of these conditions: -blood clotting disorders or history of blood clots -cancer patient not on chemotherapy -cystic fibrosis -heart disease, such as angina, heart failure, or a history of a heart attack -hemoglobin level of 12 g/dL or greater -high blood pressure -low levels of folate, iron, or vitamin B12 -seizures -an unusual or allergic reaction to darbepoetin, erythropoietin, albumin, hamster proteins, latex, other medicines, foods, dyes, or preservatives -pregnant or trying to get pregnant -breast-feeding How should I use this medicine? This medicine is for injection into a vein or under the skin. It is usually given by a health care professional in a hospital or clinic setting. If you get this medicine at home, you will be taught how to prepare and give this medicine. Do not shake the solution before you withdraw a dose. Use exactly as directed. Take your medicine at regular intervals. Do not take your medicine more often than directed. It is important that you put your used needles and syringes in a special sharps container. Do not put them in a trash can. If you do not have a sharps container, call your pharmacist or healthcare provider to get one. Talk to your pediatrician regarding the use of this medicine in children. While this medicine may be used in children as young as 1 year for selected conditions, precautions do apply. Overdosage: If you think you have taken too much of this medicine contact a poison control center or emergency room at once. NOTE:  This medicine is only for you. Do not share this medicine with others. What if I miss a dose? If you miss a dose, take it as soon as you can. If it is almost time for your next dose, take only that dose. Do not take double or extra doses. What may interact with this medicine? Do not take this medicine with any of the following medications: -epoetin alfa This list may not describe all possible interactions. Give your health care provider a list of all the medicines, herbs, non-prescription drugs, or dietary supplements you use. Also tell them if you smoke, drink alcohol, or use illegal drugs. Some items may interact with your medicine. What should I watch for while using this medicine? Visit your prescriber or health care professional for regular checks on your progress and for the needed blood tests and blood pressure measurements. It is especially important for the doctor to make sure your hemoglobin level is in the desired range, to limit the risk of potential side effects and to give you the best benefit. Keep all appointments for any recommended tests. Check your blood pressure as directed. Ask your doctor what your blood pressure should be and when you should contact him or her. As your body makes more red blood cells, you may need to take iron, folic acid, or vitamin B supplements. Ask your doctor or health care provider which products are right for you. If you have kidney disease continue dietary restrictions, even though this medication can make you feel better. Talk with your doctor or health care professional about the   foods you eat and the vitamins that you take. What side effects may I notice from receiving this medicine? Side effects that you should report to your doctor or health care professional as soon as possible: -allergic reactions like skin rash, itching or hives, swelling of the face, lips, or tongue -breathing problems -changes in vision -chest pain -confusion, trouble speaking  or understanding -feeling faint or lightheaded, falls -high blood pressure -muscle aches or pains -pain, swelling, warmth in the leg -rapid weight gain -severe headaches -sudden numbness or weakness of the face, arm or leg -trouble walking, dizziness, loss of balance or coordination -seizures (convulsions) -swelling of the ankles, feet, hands -unusually weak or tired Side effects that usually do not require medical attention (report to your doctor or health care professional if they continue or are bothersome): -diarrhea -fever, chills (flu-like symptoms) -headaches -nausea, vomiting -redness, stinging, or swelling at site where injected This list may not describe all possible side effects. Call your doctor for medical advice about side effects. You may report side effects to FDA at 1-800-FDA-1088. Where should I keep my medicine? Keep out of the reach of children. Store in a refrigerator between 2 and 8 degrees C (36 and 46 degrees F). Do not freeze. Do not shake. Throw away any unused portion if using a single-dose vial. Throw away any unused medicine after the expiration date. NOTE: This sheet is a summary. It may not cover all possible information. If you have questions about this medicine, talk to your doctor, pharmacist, or health care provider.    2016, [employer]vier/[employer]. (2008-07-06 10:23:57)  

## 2015-08-15 ENCOUNTER — Telehealth: Payer: Self-pay | Admitting: Hematology

## 2015-08-15 NOTE — Telephone Encounter (Signed)
Patient called to r/s 1/10 to 1/11 due to weather.

## 2015-08-16 ENCOUNTER — Ambulatory Visit: Payer: Medicare Other

## 2015-08-16 ENCOUNTER — Ambulatory Visit: Payer: Medicare Other | Admitting: Hematology

## 2015-08-16 ENCOUNTER — Other Ambulatory Visit: Payer: Medicare Other

## 2015-08-16 ENCOUNTER — Emergency Department (HOSPITAL_COMMUNITY): Payer: Medicare Other

## 2015-08-16 ENCOUNTER — Inpatient Hospital Stay (HOSPITAL_COMMUNITY)
Admission: EM | Admit: 2015-08-16 | Discharge: 2015-08-25 | DRG: 811 | Disposition: A | Discharge status: Home / Self Care | Payer: Medicare Other | Attending: Internal Medicine | Admitting: Internal Medicine

## 2015-08-16 ENCOUNTER — Encounter (HOSPITAL_COMMUNITY): Payer: Self-pay | Admitting: Nurse Practitioner

## 2015-08-16 DIAGNOSIS — R630 Anorexia: Secondary | ICD-10-CM | POA: Diagnosis present

## 2015-08-16 DIAGNOSIS — M109 Gout, unspecified: Secondary | ICD-10-CM | POA: Diagnosis present

## 2015-08-16 DIAGNOSIS — J449 Chronic obstructive pulmonary disease, unspecified: Secondary | ICD-10-CM | POA: Diagnosis present

## 2015-08-16 DIAGNOSIS — Z9989 Dependence on other enabling machines and devices: Secondary | ICD-10-CM

## 2015-08-16 DIAGNOSIS — Z8249 Family history of ischemic heart disease and other diseases of the circulatory system: Secondary | ICD-10-CM | POA: Diagnosis not present

## 2015-08-16 DIAGNOSIS — Z833 Family history of diabetes mellitus: Secondary | ICD-10-CM | POA: Diagnosis not present

## 2015-08-16 DIAGNOSIS — I4819 Other persistent atrial fibrillation: Secondary | ICD-10-CM | POA: Insufficient documentation

## 2015-08-16 DIAGNOSIS — R195 Other fecal abnormalities: Secondary | ICD-10-CM | POA: Insufficient documentation

## 2015-08-16 DIAGNOSIS — N179 Acute kidney failure, unspecified: Secondary | ICD-10-CM | POA: Diagnosis present

## 2015-08-16 DIAGNOSIS — C9200 Acute myeloblastic leukemia, not having achieved remission: Secondary | ICD-10-CM | POA: Diagnosis present

## 2015-08-16 DIAGNOSIS — I5032 Chronic diastolic (congestive) heart failure: Secondary | ICD-10-CM | POA: Diagnosis not present

## 2015-08-16 DIAGNOSIS — Z9981 Dependence on supplemental oxygen: Secondary | ICD-10-CM | POA: Diagnosis not present

## 2015-08-16 DIAGNOSIS — I2721 Secondary pulmonary arterial hypertension: Secondary | ICD-10-CM | POA: Diagnosis present

## 2015-08-16 DIAGNOSIS — E869 Volume depletion, unspecified: Secondary | ICD-10-CM | POA: Diagnosis present

## 2015-08-16 DIAGNOSIS — I5033 Acute on chronic diastolic (congestive) heart failure: Secondary | ICD-10-CM | POA: Diagnosis present

## 2015-08-16 DIAGNOSIS — D122 Benign neoplasm of ascending colon: Secondary | ICD-10-CM | POA: Diagnosis present

## 2015-08-16 DIAGNOSIS — Z79899 Other long term (current) drug therapy: Secondary | ICD-10-CM

## 2015-08-16 DIAGNOSIS — K5900 Constipation, unspecified: Secondary | ICD-10-CM | POA: Diagnosis not present

## 2015-08-16 DIAGNOSIS — J9611 Chronic respiratory failure with hypoxia: Secondary | ICD-10-CM | POA: Diagnosis present

## 2015-08-16 DIAGNOSIS — I272 Other secondary pulmonary hypertension: Secondary | ICD-10-CM | POA: Diagnosis present

## 2015-08-16 DIAGNOSIS — M069 Rheumatoid arthritis, unspecified: Secondary | ICD-10-CM | POA: Diagnosis present

## 2015-08-16 DIAGNOSIS — D696 Thrombocytopenia, unspecified: Secondary | ICD-10-CM | POA: Diagnosis present

## 2015-08-16 DIAGNOSIS — D62 Acute posthemorrhagic anemia: Secondary | ICD-10-CM | POA: Diagnosis not present

## 2015-08-16 DIAGNOSIS — Z87891 Personal history of nicotine dependence: Secondary | ICD-10-CM | POA: Diagnosis not present

## 2015-08-16 DIAGNOSIS — I130 Hypertensive heart and chronic kidney disease with heart failure and stage 1 through stage 4 chronic kidney disease, or unspecified chronic kidney disease: Secondary | ICD-10-CM | POA: Diagnosis present

## 2015-08-16 DIAGNOSIS — I959 Hypotension, unspecified: Secondary | ICD-10-CM | POA: Diagnosis present

## 2015-08-16 DIAGNOSIS — E875 Hyperkalemia: Secondary | ICD-10-CM | POA: Diagnosis present

## 2015-08-16 DIAGNOSIS — I10 Essential (primary) hypertension: Secondary | ICD-10-CM | POA: Diagnosis present

## 2015-08-16 DIAGNOSIS — G4733 Obstructive sleep apnea (adult) (pediatric): Secondary | ICD-10-CM | POA: Diagnosis present

## 2015-08-16 DIAGNOSIS — D631 Anemia in chronic kidney disease: Secondary | ICD-10-CM | POA: Diagnosis present

## 2015-08-16 DIAGNOSIS — K5790 Diverticulosis of intestine, part unspecified, without perforation or abscess without bleeding: Secondary | ICD-10-CM | POA: Diagnosis present

## 2015-08-16 DIAGNOSIS — Z7982 Long term (current) use of aspirin: Secondary | ICD-10-CM | POA: Diagnosis not present

## 2015-08-16 DIAGNOSIS — N183 Chronic kidney disease, stage 3 unspecified: Secondary | ICD-10-CM | POA: Diagnosis present

## 2015-08-16 DIAGNOSIS — Z860100 Personal history of colon polyps, unspecified: Secondary | ICD-10-CM | POA: Insufficient documentation

## 2015-08-16 DIAGNOSIS — D469 Myelodysplastic syndrome, unspecified: Secondary | ICD-10-CM | POA: Diagnosis present

## 2015-08-16 DIAGNOSIS — D120 Benign neoplasm of cecum: Secondary | ICD-10-CM | POA: Insufficient documentation

## 2015-08-16 DIAGNOSIS — I480 Paroxysmal atrial fibrillation: Secondary | ICD-10-CM | POA: Diagnosis not present

## 2015-08-16 DIAGNOSIS — D509 Iron deficiency anemia, unspecified: Principal | ICD-10-CM | POA: Diagnosis present

## 2015-08-16 DIAGNOSIS — R0600 Dyspnea, unspecified: Secondary | ICD-10-CM | POA: Diagnosis not present

## 2015-08-16 DIAGNOSIS — I340 Nonrheumatic mitral (valve) insufficiency: Secondary | ICD-10-CM | POA: Diagnosis present

## 2015-08-16 DIAGNOSIS — D72829 Elevated white blood cell count, unspecified: Secondary | ICD-10-CM | POA: Diagnosis present

## 2015-08-16 DIAGNOSIS — N184 Chronic kidney disease, stage 4 (severe): Secondary | ICD-10-CM | POA: Diagnosis present

## 2015-08-16 DIAGNOSIS — D649 Anemia, unspecified: Secondary | ICD-10-CM | POA: Diagnosis present

## 2015-08-16 LAB — URINALYSIS, ROUTINE W REFLEX MICROSCOPIC
BILIRUBIN URINE: NEGATIVE
Glucose, UA: NEGATIVE mg/dL
Hgb urine dipstick: NEGATIVE
Ketones, ur: NEGATIVE mg/dL
LEUKOCYTES UA: NEGATIVE
NITRITE: NEGATIVE
PH: 5 (ref 5.0–8.0)
Protein, ur: NEGATIVE mg/dL
SPECIFIC GRAVITY, URINE: 1.013 (ref 1.005–1.030)

## 2015-08-16 LAB — CBC
HCT: 21.6 % — ABNORMAL LOW (ref 36.0–46.0)
Hemoglobin: 7 g/dL — ABNORMAL LOW (ref 12.0–15.0)
MCH: 31 pg (ref 26.0–34.0)
MCHC: 32.4 g/dL (ref 30.0–36.0)
MCV: 95.6 fL (ref 78.0–100.0)
PLATELETS: 126 10*3/uL — AB (ref 150–400)
RBC: 2.26 MIL/uL — AB (ref 3.87–5.11)
RDW: 16.6 % — ABNORMAL HIGH (ref 11.5–15.5)
WBC: 13.4 10*3/uL — AB (ref 4.0–10.5)

## 2015-08-16 LAB — CBC WITH DIFFERENTIAL/PLATELET
BASOS ABS: 0 10*3/uL (ref 0.0–0.1)
BASOS PCT: 0 %
EOS PCT: 0 %
Eosinophils Absolute: 0 10*3/uL (ref 0.0–0.7)
HEMATOCRIT: 16.8 % — AB (ref 36.0–46.0)
Hemoglobin: 5.2 g/dL — CL (ref 12.0–15.0)
Lymphocytes Relative: 12 %
Lymphs Abs: 1.7 10*3/uL (ref 0.7–4.0)
MCH: 30.8 pg (ref 26.0–34.0)
MCHC: 31 g/dL (ref 30.0–36.0)
MCV: 99.4 fL (ref 78.0–100.0)
MONO ABS: 0.6 10*3/uL (ref 0.1–1.0)
MONOS PCT: 4 %
NEUTROS ABS: 12.1 10*3/uL — AB (ref 1.7–7.7)
Neutrophils Relative %: 84 %
PLATELETS: 175 10*3/uL (ref 150–400)
RBC: 1.69 MIL/uL — ABNORMAL LOW (ref 3.87–5.11)
RDW: 15.3 % (ref 11.5–15.5)
WBC: 14.4 10*3/uL — ABNORMAL HIGH (ref 4.0–10.5)

## 2015-08-16 LAB — PREPARE RBC (CROSSMATCH)

## 2015-08-16 LAB — FERRITIN: Ferritin: 17 ng/mL (ref 11–307)

## 2015-08-16 LAB — CBG MONITORING, ED: GLUCOSE-CAPILLARY: 121 mg/dL — AB (ref 65–99)

## 2015-08-16 LAB — I-STAT CG4 LACTIC ACID, ED
LACTIC ACID, VENOUS: 1.66 mmol/L (ref 0.5–2.0)
Lactic Acid, Venous: 2.1 mmol/L (ref 0.5–2.0)

## 2015-08-16 LAB — POTASSIUM: Potassium: 5.3 mmol/L — ABNORMAL HIGH (ref 3.5–5.1)

## 2015-08-16 LAB — RETICULOCYTES
RBC.: 1.48 MIL/uL — ABNORMAL LOW (ref 3.87–5.11)
RETIC COUNT ABSOLUTE: 119.9 10*3/uL (ref 19.0–186.0)
Retic Ct Pct: 8.1 % — ABNORMAL HIGH (ref 0.4–3.1)

## 2015-08-16 LAB — BASIC METABOLIC PANEL
ANION GAP: 9 (ref 5–15)
BUN: 98 mg/dL — ABNORMAL HIGH (ref 6–20)
CALCIUM: 10 mg/dL (ref 8.9–10.3)
CO2: 22 mmol/L (ref 22–32)
CREATININE: 2.76 mg/dL — AB (ref 0.44–1.00)
Chloride: 104 mmol/L (ref 101–111)
GFR, EST AFRICAN AMERICAN: 19 mL/min — AB (ref >60)
GFR, EST NON AFRICAN AMERICAN: 17 mL/min — AB (ref >60)
GLUCOSE: 165 mg/dL — AB (ref 65–99)
Potassium: 5.6 mmol/L — ABNORMAL HIGH (ref 3.5–5.1)
Sodium: 135 mmol/L (ref 135–145)

## 2015-08-16 LAB — I-STAT TROPONIN, ED: Troponin i, poc: 0 ng/mL (ref 0.00–0.08)

## 2015-08-16 LAB — MRSA PCR SCREENING: MRSA BY PCR: NEGATIVE

## 2015-08-16 LAB — IRON AND TIBC
IRON: 13 ug/dL — AB (ref 28–170)
Saturation Ratios: 4 % — ABNORMAL LOW (ref 10.4–31.8)
TIBC: 311 ug/dL (ref 250–450)
UIBC: 298 ug/dL

## 2015-08-16 LAB — LACTATE DEHYDROGENASE: LDH: 270 U/L — AB (ref 98–192)

## 2015-08-16 LAB — FOLATE: FOLATE: 42 ng/mL (ref >5.9)

## 2015-08-16 LAB — VITAMIN B12: Vitamin B-12: 539 pg/mL (ref 180–914)

## 2015-08-16 MED ORDER — CETYLPYRIDINIUM CHLORIDE 0.05 % MT LIQD
7.0000 mL | Freq: Two times a day (BID) | OROMUCOSAL | Status: DC
  Administered 2015-08-16 – 2015-08-22 (×11): 7 mL via OROMUCOSAL

## 2015-08-16 MED ORDER — SODIUM CHLORIDE 0.9 % IV BOLUS (SEPSIS)
500.0000 mL | Freq: Once | INTRAVENOUS | Status: AC
  Administered 2015-08-16: 500 mL via INTRAVENOUS

## 2015-08-16 MED ORDER — ONDANSETRON HCL 4 MG/2ML IJ SOLN: 4.0000 mg | Freq: Four times a day (QID) | INTRAMUSCULAR | Status: DC | PRN

## 2015-08-16 MED ORDER — ACETAMINOPHEN 325 MG PO TABS
650.0000 mg | ORAL_TABLET | Freq: Four times a day (QID) | ORAL | Status: DC | PRN
  Administered 2015-08-21 – 2015-08-24 (×2): 650 mg via ORAL
  Filled 2015-08-16 (×2): qty 2

## 2015-08-16 MED ORDER — SODIUM CHLORIDE 0.9 % IJ SOLN
3.0000 mL | Freq: Two times a day (BID) | INTRAMUSCULAR | Status: DC
  Administered 2015-08-16 – 2015-08-25 (×13): 3 mL via INTRAVENOUS

## 2015-08-16 MED ORDER — DEXAMETHASONE SODIUM PHOSPHATE 10 MG/ML IJ SOLN
10.0000 mg | Freq: Once | INTRAMUSCULAR | Status: AC
  Administered 2015-08-16: 10 mg via INTRAVENOUS
  Filled 2015-08-16: qty 1

## 2015-08-16 MED ORDER — INSULIN ASPART 100 UNIT/ML IV SOLN
10.0000 [IU] | Freq: Once | INTRAVENOUS | Status: AC
  Administered 2015-08-16: 10 [IU] via INTRAVENOUS
  Filled 2015-08-16: qty 1

## 2015-08-16 MED ORDER — PANTOPRAZOLE SODIUM 40 MG IV SOLR
40.0000 mg | Freq: Two times a day (BID) | INTRAVENOUS | Status: DC
  Administered 2015-08-16 – 2015-08-17 (×3): 40 mg via INTRAVENOUS
  Filled 2015-08-16 (×3): qty 40

## 2015-08-16 MED ORDER — DARBEPOETIN ALFA 40 MCG/0.4ML IJ SOSY
40.0000 ug | PREFILLED_SYRINGE | Freq: Once | INTRAMUSCULAR | Status: AC
  Administered 2015-08-16: 40 ug via SUBCUTANEOUS
  Filled 2015-08-16: qty 0.4

## 2015-08-16 MED ORDER — ALPRAZOLAM 0.25 MG PO TABS
0.2500 mg | ORAL_TABLET | ORAL | Status: DC | PRN
  Administered 2015-08-16 – 2015-08-24 (×8): 0.25 mg via ORAL
  Filled 2015-08-16 (×8): qty 1

## 2015-08-16 MED ORDER — DEXTROSE 50 % IV SOLN
25.0000 mL | Freq: Once | INTRAVENOUS | Status: AC
  Administered 2015-08-16: 25 mL via INTRAVENOUS
  Filled 2015-08-16: qty 50

## 2015-08-16 MED ORDER — SODIUM CHLORIDE 0.9 % IV SOLN
1.0000 g | Freq: Once | INTRAVENOUS | Status: AC
  Administered 2015-08-16: 1 g via INTRAVENOUS
  Filled 2015-08-16: qty 10

## 2015-08-16 MED ORDER — ACETAMINOPHEN 650 MG RE SUPP: 650.0000 mg | Freq: Four times a day (QID) | RECTAL | Status: DC | PRN

## 2015-08-16 MED ORDER — ONDANSETRON HCL 4 MG PO TABS
4.0000 mg | ORAL_TABLET | Freq: Four times a day (QID) | ORAL | Status: DC | PRN
  Administered 2015-08-22: 4 mg via ORAL
  Filled 2015-08-16: qty 1

## 2015-08-16 NOTE — ED Provider Notes (Signed)
CSN: [id number]     Arrival date & time 08/16/15  1006 History   First MD Initiated Contact with Patient 08/16/15 1007     Chief Complaint: generalized weakness  HPI NYLLA BUBIER is a 70 y.o. F PMH significant for HTN, anemia, CHF (55-60%), presenting with a 5 day history of generalized weakness. She endorses decreased appetite, exertional dyspnea, chest pressure. She denies fevers, chills, cough, N/V/D, dysuria.   She is scheduled with Dr. Irene Limbo for follow-up tomorrow to receive an Aranesp injection.  Past Medical History  Diagnosis Date  . Hypertension   . Pulmonary HTN ([place])   . Aortic aneurysm ([place])   . Sleep apnea     wears CPAP  . CKD (chronic kidney disease) stage 3, GFR 30-59 ml/min   . Anemia 03/23/15    transfusion  . Arthritis   . CHF (congestive heart failure) ([place])   . Gout   . Rheumatoid arthritis(714.0) 11/06/2012   Past Surgical History  Procedure Laterality Date  . Abdominal aortic aneurysm repair    . Right heart catheterization N/A 08/18/2014    Procedure: RIGHT HEART CATH;  Surgeon: Jolaine Artist, MD;  Location: [facility] CATH LAB;  Service: Cardiovascular;  Laterality: N/A;  . Tee without cardioversion N/A 01/05/2015    Procedure: TRANSESOPHAGEAL ECHOCARDIOGRAM (TEE);  Surgeon: Larey Dresser, MD;  Location[facility] ENDOSCOPY;  Service: Cardiovascular;  Laterality: N/A;  . Wisdom tooth extraction     Family History  Problem Relation Age of Onset  . Heart attack Mother   . Prostate cancer Father   . Diabetes Brother   . Kidney failure Brother    Social History  Substance Use Topics  . Smoking status: Former Smoker -- 1.50 packs/day for 14 years    Types: Cigarettes    Quit date: 08/09/2003  . Smokeless tobacco: Never Used  . Alcohol Use: No   OB History    No data available     Review of Systems  Ten systems are reviewed and are negative for acute change except as noted in the HPI  Allergies  Review of patient's allergies indicates no known  allergies.  Home Medications   Prior to Admission medications   Medication Sig Start Date End Date Taking? Authorizing Provider  allopurinol (ZYLOPRIM) 300 MG tablet Take 300 mg by mouth daily.    Historical Provider, MD  ALPRAZolam Duanne Moron) 0.25 MG tablet Take 0.25 mg by mouth as needed for sleep.    Historical Provider, MD  amLODipine (NORVASC) 5 MG tablet Take 0.5 tablets (2.5 mg total) by mouth daily. 04/21/15   Larey Dresser, MD  aspirin EC 81 MG tablet Take 1 tablet (81 mg total) by mouth daily. 05/23/15   Larey Dresser, MD  atorvastatin (LIPITOR) 10 MG tablet Take 1 tablet (10 mg total) by mouth daily. 03/22/15   Larey Dresser, MD  b complex vitamins tablet Take 1 tablet by mouth daily.    Historical Provider, MD  ferrous sulfate (SLOW FE) 160 (50 FE) MG TBCR SR tablet Take 160 mg by mouth daily.    Historical Provider, MD  FLUAD 0.5 ML SUSY ADM 0.5ML IM UTD 05/19/15   Historical Provider, MD  FLUOCINOLONE ACETONIDE BODY 0.01 % OIL Apply 1 mL topically 2 (two) times daily as needed (for flares).  12/17/14   Historical Provider, MD  FLUOCINOLONE ACETONIDE SCALP 0.01 % OIL  05/06/15   Historical Provider, MD  furosemide (LASIX) 40 MG tablet Take 40 mg by  mouth 2 (two) times daily.    Historical Provider, MD  hydroxychloroquine (PLAQUENIL) 200 MG tablet Take 200 mg by mouth 2 (two) times daily.    Historical Provider, MD  ketoconazole (NIZORAL) 2 % shampoo Apply 1 application topically 2 (two) times a week. Leave on 5 minutes before rinsing out 12/15/14   Historical Provider, MD  lidocaine (LIDODERM) 5 %  06/14/15   Historical Provider, MD  metoprolol succinate (TOPROL-XL) 100 MG 24 hr tablet Take 1 tablet (100 mg total) by mouth 2 (two) times daily. 03/21/15   Jolaine Artist, MD  Omega-3 Fatty Acids (FISH OIL) 1200 MG CAPS Take 1,200 mg by mouth daily.    Historical Provider, MD  OXYGEN Inhale 6 L into the lungs continuous.    Historical Provider, MD  Potassium Chloride ER 20 MEQ TBCR  Take 20 mEq by mouth daily. 01/06/15   Larey Dresser, MD  potassium chloride SA (K-DUR,KLOR-CON) 20 MEQ tablet  06/04/15   Historical Provider, MD  spironolactone (ALDACTONE) 25 MG tablet Take 1 tablet (25 mg total) by mouth daily. 12/29/14   Larey Dresser, MD   BP 81/58 mmHg  Pulse 67  Temp(Src) 98.7 F (37.1 C) (Oral)  Resp 18  Ht 5' 4.5" (1.638 m)  Wt 96.163 kg  BMI 35.84 kg/m2  SpO2 100% Physical Exam  Constitutional: She is oriented to person, place, and time. She appears well-developed and well-nourished. No distress.  HENT:  Head: Normocephalic and atraumatic.  Right Ear: External ear normal.  Left Ear: External ear normal.  Nose: Nose normal.  Mouth/Throat: Oropharynx is clear and moist. No oropharyngeal exudate.  Eyes: Conjunctivae and EOM are normal. Pupils are equal, round, and reactive to light. Right eye exhibits no discharge. Left eye exhibits no discharge. No scleral icterus.  Neck: Normal range of motion. No tracheal deviation present.  Cardiovascular: Normal rate, regular rhythm and intact distal pulses.  Exam reveals no gallop and no friction rub.   Murmur heard. Pulmonary/Chest: Effort normal and breath sounds normal. No respiratory distress. She has no wheezes. She has no rales. She exhibits no tenderness.  Abdominal: Soft. Bowel sounds are normal. She exhibits no distension and no mass. There is no tenderness. There is no rebound and no guarding.  Musculoskeletal: She exhibits no edema.  Lymphadenopathy:    She has no cervical adenopathy.  Neurological: She is alert and oriented to person, place, and time. No cranial nerve deficit. Coordination normal.  Skin: Skin is warm and dry. No rash noted. She is not diaphoretic. No erythema.  Psychiatric: She has a normal mood and affect. Her behavior is normal.  Nursing note and vitals reviewed.   ED Course  Procedures  Labs Review Labs Reviewed  CBC WITH DIFFERENTIAL/PLATELET - Abnormal; Notable for the  following:    WBC 14.4 (*)    RBC 1.69 (*)    Hemoglobin 5.2 (*)    HCT 16.8 (*)    Neutro Abs 12.1 (*)    All other components within normal limits  BASIC METABOLIC PANEL - Abnormal; Notable for the following:    Potassium 5.6 (*)    Glucose, Bld 165 (*)    BUN 98 (*)    Creatinine, Ser 2.76 (*)    GFR calc non Af Amer 17 (*)    GFR calc Af Amer 19 (*)    All other components within normal limits  I-STAT CG4 LACTIC ACID, ED - Abnormal; Notable for the following:    Lactic  Acid, Venous 2.10 (*)    All other components within normal limits  URINALYSIS, ROUTINE W REFLEX MICROSCOPIC (NOT AT [facility])  I-STAT TROPOININ, ED  TYPE AND SCREEN  PREPARE RBC (CROSSMATCH)   Imaging Review Dg Chest 2 View  08/16/2015  CLINICAL DATA:  Weakness, shortness of breath for 5 days EXAM: CHEST  2 VIEW COMPARISON:  12/17/2014 FINDINGS: Cardiomediastinal silhouette is stable. Again noted dilated aortic arch and descending aorta. Status post median sternotomy. Stable right perihilar scarring. Central mild vascular congestion without convincing pulmonary edema. Mild left basilar atelectasis. Mild degenerative changes thoracic spine. No definite superimposed infiltrate or pulmonary edema. IMPRESSION: Cardiomegaly again noted. 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 Status post median sternotomy. Stable scarring right midlung. Central mild vascular congestion without pulmonary edema. Mild left basilar atelectasis. No definite superimposed infiltrate. Electronically Signed   By: Lahoma Crocker M.D.   On: 08/16/2015 11:38   I have personally reviewed and evaluated these images and lab results as part of my medical decision-making.   EKG Interpretation   Date/Time:  Tuesday August 16 2015 10:25:20 EST Ventricular Rate:  68 PR Interval:    QRS Duration: 122 QT Interval:  423 QTC Calculation: 450 R Axis:   36 Text Interpretation:  Atrial fibrillation Nonspecific intraventricular   conduction delay ST elevation, consider inferior injury Sinus rhythm  Artifact T wave abnormality Abnormal ekg Confirmed by Carmin Muskrat  MD  ([id number]) on 08/16/2015 12:25:34 PM      MDM   Final diagnoses:  Symptomatic anemia   Patient hypotensive on exam. Neuro exam unremarkable. Will perform broad workup for generalized weakness for infectious vs cardiac.  CBC reveals hgb of 5.2 (last hgb was 8 in December), leukocytosis of 14.4.  BMP with hyperkalemia of 5.6. Will repeat potassium level. Otherwise, hyperglycemia of 165, SCr of 2.76 (Baseline- in November it was 2.8). Lactic acid of 2.10.  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 Stable scarring right midlung, central mild vascular congestion without pulmonary edema. Mild basilar atelectasis. No superimposed infiltrate.  Patient will need to be admitted for symptomatic anemia and blood transfusion. Hospitalist agrees to admission. Patient can be safely admitted and is in understanding and agreement with plan.    [place] Lions, PA-C 08/16/15 2239  Carmin Muskrat, MD 08/17/15 1539

## 2015-08-16 NOTE — Progress Notes (Signed)
MEDICATION RELATED CONSULT NOTE - INITIAL   Pharmacy Consult for Aranesp Indication: Anemia of CKD  No Known Allergies  Patient Measurements: Height: 5' 4.5" (163.8 cm) Weight: 212 lb (96.163 kg) IBW/kg (Calculated) : 55.85  Vital Signs: Temp: 98.7 F (37.1 C) (01/10 1010) Temp Source: Oral (01/10 1010) BP: 82/54 mmHg (01/10 1245) Pulse Rate: 66 (01/10 1245) Intake/Output from previous day:   Intake/Output from this shift:    Labs:  Recent Labs  08/16/15 1030  WBC 14.4*  HGB 5.2*  HCT 16.8*  PLT 175  CREATININE 2.76*   Estimated Creatinine Clearance: 22.5 mL/min (by C-G formula based on Cr of 2.76).   Microbiology: No results found for this or any previous visit (from the past 720 hour(s)).  Medical History: Past Medical History  Diagnosis Date  . Hypertension   . Pulmonary HTN ([place])   . Aortic aneurysm ([place])   . Sleep apnea     wears CPAP  . CKD (chronic kidney disease) stage 3, GFR 30-59 ml/min   . Anemia 03/23/15    transfusion  . Arthritis   . CHF (congestive heart failure) ([place])   . Gout   . Rheumatoid arthritis(714.0) 11/06/2012    Assessment: 70 yo F presents on 1/10 with generalized weakness. Pharmacy consulted to start Aranesp for anemia of CKD. Has a history of CKD stage 3. TBW is 96 kg. Hgb is low at 5.2.  Goal of Therapy:  Resolution of anemia  Plan:  Give Aranesp 41mcg [place] x 1 F/U CBC  April Mueller J 08/16/2015,1:26 PM

## 2015-08-16 NOTE — H&P (Signed)
[facility] History and Physical                                                                                    April Mueller, is a 70 y.o. female  MRN: [id number]   DOB - [birth year]  Admit Date - 08/16/2015  Outpatient Primary MD for the patient is  Melinda Crutch, MD  Referring MD: Vanita Panda / ER   PMH: Past Medical History  Diagnosis Date  . Hypertension   . Pulmonary HTN ([place])   . Aortic aneurysm ([place])   . Sleep apnea     wears CPAP  . CKD (chronic kidney disease) stage 3, GFR 30-59 ml/min   . Anemia 03/23/15    transfusion  . Arthritis   . CHF (congestive heart failure) ([place])   . Gout   . Rheumatoid arthritis(714.0) 11/06/2012      PSH: Past Surgical History  Procedure Laterality Date  . Abdominal aortic aneurysm repair    . Right heart catheterization N/A 08/18/2014    Procedure: RIGHT HEART CATH;  Surgeon: Jolaine Artist, MD;  Location: [facility] CATH LAB;  Service: Cardiovascular;  Laterality: N/A;  . Tee without cardioversion N/A 01/05/2015    Procedure: TRANSESOPHAGEAL ECHOCARDIOGRAM (TEE);  Surgeon: Larey Dresser, MD;  Location: [facility] ENDOSCOPY;  Service: Cardiovascular;  Laterality: N/A;  . Wisdom tooth extraction       CC: Dyspnea on exertion, fatigue, malaise and chest pressure with exertion    HPI: This is a 70 year old female patient with known chronic anemia followed by oncology, chronic hypoxemic respiratory failure with a degree of chronic dyspnea on exertion in the setting of pulmonary hypertension chronic diastolic heart failure and mitral regurgitation, she also has a history of chronic kidney disease, rheumatoid arthritis on Plaquenil, sleep apnea and CPAP, hypertension and obesity. Patient reports that for the past 5 days she's had progressive worsening of her dyspnea on exertion and this has been associated with malaise fatigue generalized weakness and chest discomfort while mobilizing. She's also had anorexia. She attempted to turn up her oxygen  without any improvement in her symptoms. She denies weight gain and worsening edema. She has chronic dark stools which have not changed in consistency or odor. Her last IV iron infusion was 3 months ago and she has received 1 dose of Aranesp and was due another dose this week. She's not had any constitutional symptoms such as fevers or chills abdominal pain nausea vomiting diarrhea no dysuria. She's not had any upper respiratory symptoms nor cough.  ER Evaluation and treatment: Afebrile, pulse rate 66 and regular, BP 83/54, 3 L 100% sat EKG: Sinus rhythm with borderline prolonged PR of 0.24, QTC 450 ms, no ischemic changes, low voltage capture in V2 likely more related to lead placement Two-view chest x-ray with no acute changes with mild central vascular congestion without pulmonary edema Potassium 5.6 with repeat 5.3 BUN 98 creatinine 2.76 with baseline BUN 45 and creatinine 2.8 Troponin 0.00 Lactic acid 2.1 WBC 14,400 with neutrophils 84% and absolute neutrophils 12.1% Hemoglobin 5.2 with recent baselines between 0.4 and 10.1 Normal saline IV bolus 1 for hypotension with lowest blood pressure reading 78/59  2 units of packed red blood cells and been ordered and awaiting preparation for transfusion    Review of Systems   In addition to the HPI above,  No Fever-chills, myalgias or other constitutional symptoms No Headache, changes with Vision or hearing, new focal weakness, tingling, numbness in any extremity, No problems swallowing food or Liquids, indigestion/reflux No Cough or palpitations No Abdominal pain, N/V; no melena or hematochezia, no dark tarry stools No dysuria, hematuria or flank pain No new skin rashes, lesions, masses or bruises, No new joints pains-aches No recent weight gain or loss No polyuria, polydypsia or polyphagia,  *A full 10 point Review of Systems was done, except as stated above, all other Review of Systems were negative.  Social History Social History    Substance Use Topics  . Smoking status: Former Smoker -- 1.50 packs/day for 14 years    Types: Cigarettes    Quit date: 08/09/2003  . Smokeless tobacco: Never Used  . Alcohol Use: No    Resides at: Private residence  Lives with: Alone  Ambulatory status: Without assistive devices   Family History Family History  Problem Relation Age of Onset  . Heart attack Mother   . Prostate cancer Father   . Diabetes Brother   . Kidney failure Brother      Prior to Admission medications   Medication Sig Start Date End Date Taking? Authorizing Provider  furosemide (LASIX) 40 MG tablet Take 40 mg by mouth 2 (two) times daily.   Yes Historical Provider, MD  OXYGEN Inhale 6-8 L into the lungs continuous.    Yes Historical Provider, MD  allopurinol (ZYLOPRIM) 300 MG tablet Take 300 mg by mouth daily.    Historical Provider, MD  ALPRAZolam Duanne Moron) 0.25 MG tablet Take 0.25 mg by mouth as needed for sleep.    Historical Provider, MD  amLODipine (NORVASC) 5 MG tablet Take 0.5 tablets (2.5 mg total) by mouth daily. 04/21/15   Larey Dresser, MD  aspirin EC 81 MG tablet Take 1 tablet (81 mg total) by mouth daily. 05/23/15   Larey Dresser, MD  atorvastatin (LIPITOR) 10 MG tablet Take 1 tablet (10 mg total) by mouth daily. 03/22/15   Larey Dresser, MD  b complex vitamins tablet Take 1 tablet by mouth daily.    Historical Provider, MD  ferrous sulfate (SLOW FE) 160 (50 FE) MG TBCR SR tablet Take 160 mg by mouth daily.    Historical Provider, MD  FLUAD 0.5 ML SUSY ADM 0.5ML IM UTD 05/19/15   Historical Provider, MD  FLUOCINOLONE ACETONIDE BODY 0.01 % OIL Apply 1 mL topically 2 (two) times daily as needed (for flares).  12/17/14   Historical Provider, MD  FLUOCINOLONE ACETONIDE SCALP 0.01 % OIL  05/06/15   Historical Provider, MD  hydroxychloroquine (PLAQUENIL) 200 MG tablet Take 200 mg by mouth 2 (two) times daily.    Historical Provider, MD  ketoconazole (NIZORAL) 2 % shampoo Apply 1 application  topically 2 (two) times a week. Leave on 5 minutes before rinsing out 12/15/14   Historical Provider, MD  lidocaine (LIDODERM) 5 %  06/14/15   Historical Provider, MD  metoprolol succinate (TOPROL-XL) 100 MG 24 hr tablet Take 1 tablet (100 mg total) by mouth 2 (two) times daily. 03/21/15   Jolaine Artist, MD  Omega-3 Fatty Acids (FISH OIL) 1200 MG CAPS Take 1,200 mg by mouth daily.    Historical Provider, MD  Potassium Chloride ER 20 MEQ TBCR Take 20 mEq  by mouth daily. 01/06/15   Larey Dresser, MD  potassium chloride SA (K-DUR,KLOR-CON) 20 MEQ tablet  06/04/15   Historical Provider, MD  spironolactone (ALDACTONE) 25 MG tablet Take 1 tablet (25 mg total) by mouth daily. 12/29/14   Larey Dresser, MD    No Known Allergies  Physical Exam  Vitals  Blood pressure 82/54, pulse 66, temperature 98.7 F (37.1 C), temperature source Oral, resp. rate 20, height 5' 4.5" (1.638 m), weight 212 lb (96.163 kg), SpO2 100 %.   General:  In no acute distress, appears stated age  Psych:  Normal affect, Denies Suicidal or Homicidal ideations, Awake Alert, Oriented X 3. Speech and thought patterns are clear and appropriate, no apparent short term memory deficits  Neuro:   No focal neurological deficits, CN II through XII intact, Strength 5/5 all 4 extremities, Sensation intact all 4 extremities.  ENT:  Ears and Eyes appear Normal, Conjunctivae pale, PER. Moist oral mucosa without erythema or exudates.  Neck:  Supple, No lymphadenopathy appreciated  Respiratory:  Symmetrical chest wall movement, Good air movement bilaterally, CTAB. Room Air  Cardiac:  RRR, grade 2/6 systolic Murmur left sternal border at apex, no LE edema noted, no JVD, No carotid bruits, peripheral pulses palpable at 2+  Abdomen:  Positive bowel sounds, Soft, Non tender, Non distended,  No masses appreciated, no obvious hepatosplenomegaly  Skin:  No Cyanosis, Normal Skin Turgor, No Skin Rash or Bruise. Pale in  appearance  Extremities: Symmetrical without obvious trauma or injury,  no effusions.  Data Review  CBC  Recent Labs Lab 08/16/15 1030  WBC 14.4*  HGB 5.2*  HCT 16.8*  PLT 175  MCV 99.4  MCH 30.8  MCHC 31.0  RDW 15.3  LYMPHSABS 1.7  MONOABS 0.6  EOSABS 0.0  BASOSABS 0.0    Chemistries   Recent Labs Lab 08/16/15 1030 08/16/15 1224  NA 135  --   K 5.6* 5.3*  CL 104  --   CO2 22  --   GLUCOSE 165*  --   BUN 98*  --   CREATININE 2.76*  --   CALCIUM 10.0  --     estimated creatinine clearance is 22.5 mL/min (by C-G formula based on Cr of 2.76).  No results for input(s): TSH, T4TOTAL, T3FREE, THYROIDAB in the last 72 hours.  Invalid input(s): FREET3  Coagulation profile No results for input(s): INR, PROTIME in the last 168 hours.  No results for input(s): DDIMER in the last 72 hours.  Cardiac Enzymes No results for input(s): CKMB, TROPONINI, MYOGLOBIN in the last 168 hours.  Invalid input(s): CK  Invalid input(s): POCBNP  Urinalysis No results found for: COLORURINE, APPEARANCEUR, LABSPEC, PHURINE, GLUCOSEU, HGBUR, BILIRUBINUR, KETONESUR, PROTEINUR, UROBILINOGEN, NITRITE, LEUKOCYTESUR  Imaging results:   Dg Chest 2 View  08/16/2015  CLINICAL DATA:  Weakness, shortness of breath for 5 days EXAM: CHEST  2 VIEW COMPARISON:  12/17/2014 FINDINGS: Cardiomediastinal silhouette is stable. Again noted dilated aortic arch and descending aorta. Status post median sternotomy. Stable right perihilar scarring. Central mild vascular congestion without convincing pulmonary edema. Mild left basilar atelectasis. Mild degenerative changes thoracic spine. No definite superimposed infiltrate or pulmonary edema. IMPRESSION: Cardiomegaly again noted. 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 Status post median sternotomy. Stable scarring right midlung. Central mild vascular congestion without pulmonary edema. Mild left basilar atelectasis. No definite  superimposed infiltrate. Electronically Signed   By: Lahoma Crocker M.D.   On: 08/16/2015 11:38  EKG: (Independently reviewed) Sinus rhythm with borderline prolonged PR of 0.24, QTC 450 ms, no ischemic changes, low voltage capture in V2 likely more related to lead placement   Assessment & Plan  Principal Problem:   Symptomatic anemia/normocytic anemia/MDS -Stepdown/observation -Patient's progressive dyspnea on exertion with chest pressure with exertion related to severe anemia: EKG unremarkable and initial troponin negative so no indication to cycle troponin at this juncture -Transfuse 2 units packed red blood cells and check CBC after transfusion; may require additional blood -Pharmacy to dose Aranesp -Anemia panel: Iron profoundly decreased at 13-consider IV iron infusion this admission -Per outpatient oncology notes peripheral smear suggestive of MDS -Potentially could be hemolyzing especially with elevated potassium so we'll check LDH and haptoglobin-if positive follow-up on ordered peripheral smear(see below) and check DAT -Given additional 500 mL normal saline bolus while waiting on blood transfusion since blood pressure remains suboptimal-because of chronic beta blocker use patient unable to develop physiologically appropriate tachycardia in response to symptomatic anemia -Does have elevated BUN could have upper GI blood loss so check FOB and begin IV PPI; denies NSAID use  HYPOTENSION -SEE ABOVE-viewed outpatient documentation and at last cardiology visit in October [id number] systolic blood pressure was 98 - 235 pm: For the most part has been mentating appropriately but since my initial evaluation of the patient she is becoming more more symptomatic stating she is not feeling well and when the nurse checks her blood pressure she continues to be below 90 and has dropped to as low as in the 70s -Currently first unit of blood infusing at 150 mL per hour; RN instructed to infuse blood as rapidly  as possible and if patient remains hypotensive after blood infused to please notify me -patient may need to be a weighted for ICU level of care -Repeat lactic acid has decreased to 1.6 -322 pm: SBP between 97 and 110 with pt reporting need to void and feeling weak and nauseous  Active Problems:   Leukocytosis -Likely secondary to low perfusion and hypotension in setting of anemia -Rule out infectious causes: Chest x-ray unremarkable-check urinalysis and culture -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 will check peripheral smear    Acute hyperkalemia -In setting of blood loss and potential hemolysis -Also on Aldactone prior to admission -Potassium has decreased slightly with IV bolus -EKG without peak T waves or other abnormalities so no indication to pursue Kayexalate at this point -We'll give IV D50 1 amp, IV insulin 10 units, and 1 amp of calcium gluconate for cardioprotective properties    Chronic respiratory failure with hypoxia 2/2 Secondary pulmonary hypertension/Chronic diastolic heart failure grade 3/Mitral regurgitation -Heart failure appears clinically compensated -Because of hypotension in setting of profound symptomatic anemia will hold preadmission diuretics and ARB -Echocardiogram    CKD stage 3, GFR 30-59 ml/min -Except for elevated BUN creatinine stable and at baseline    Rheumatoid arthritis  -Currently without reports of flare symptoms -In setting of worsening of underlying anemia have discontinued Plaquenil for now    OSA on CPAP -Continue hour of sleep CPAP    HTN -Currently hypotensive in setting of symptomatic profound anemia -Holding all antihypertensive medications    DVT Prophylaxis: SCDs  Family Communication:   Family member at bedside with patient's permission  Code Status:  Full code  Condition:  Guarded  Discharge disposition: Anticipate discharge back home environment once etiology of anemia progression determined and  patient hemodynamically stable and hemoglobin stable  Time spent in minutes :  60      ELLIS,ALLISON L. ANP on 08/16/2015 at 1:25 PM  You may contact me by going to [email or web address] - password [email or web address]  I am available from 7a-7p but please confirm I am on the schedule by going to Amion as above.   After 7p please contact night coverage person covering me after hours  [facility]

## 2015-08-16 NOTE — ED Notes (Addendum)
Contact son Shavonda Risi if any changes [phone]

## 2015-08-16 NOTE — ED Notes (Signed)
Meal tray ordered 

## 2015-08-16 NOTE — Progress Notes (Signed)
RT set up home CPAP for patient with home mask and tubing. RT made sure patient had sterile water. Patient states she will place herself on CPAP when ready.

## 2015-08-16 NOTE — ED Notes (Signed)
Per EMS pt from home lives by self and has been feeling generalized weakness and fatigue for the past 5 days. Pt has not eaten for the past 3 days but has had fluid intake. Patient endorses exertional dyspnea. Denies cough, n/v/d, dysuria.

## 2015-08-17 ENCOUNTER — Ambulatory Visit: Payer: Medicare Other | Admitting: Hematology

## 2015-08-17 ENCOUNTER — Other Ambulatory Visit: Payer: Medicare Other

## 2015-08-17 ENCOUNTER — Ambulatory Visit (HOSPITAL_COMMUNITY): Payer: Medicare Other

## 2015-08-17 ENCOUNTER — Ambulatory Visit: Payer: Medicare Other

## 2015-08-17 DIAGNOSIS — I340 Nonrheumatic mitral (valve) insufficiency: Secondary | ICD-10-CM

## 2015-08-17 DIAGNOSIS — R0600 Dyspnea, unspecified: Secondary | ICD-10-CM

## 2015-08-17 DIAGNOSIS — D649 Anemia, unspecified: Secondary | ICD-10-CM | POA: Diagnosis present

## 2015-08-17 LAB — BASIC METABOLIC PANEL
ANION GAP: 8 (ref 5–15)
BUN: 96 mg/dL — ABNORMAL HIGH (ref 6–20)
CALCIUM: 9.4 mg/dL (ref 8.9–10.3)
CHLORIDE: 109 mmol/L (ref 101–111)
CO2: 19 mmol/L — AB (ref 22–32)
Creatinine, Ser: 2.29 mg/dL — ABNORMAL HIGH (ref 0.44–1.00)
GFR calc Af Amer: 24 mL/min — ABNORMAL LOW (ref >60)
GFR calc non Af Amer: 21 mL/min — ABNORMAL LOW (ref >60)
GLUCOSE: 200 mg/dL — AB (ref 65–99)
Potassium: 5.3 mmol/L — ABNORMAL HIGH (ref 3.5–5.1)
Sodium: 136 mmol/L (ref 135–145)

## 2015-08-17 LAB — CBC
HCT: 21.4 % — ABNORMAL LOW (ref 36.0–46.0)
HCT: 25 % — ABNORMAL LOW (ref 36.0–46.0)
HEMOGLOBIN: 8 g/dL — AB (ref 12.0–15.0)
Hemoglobin: 6.8 g/dL — CL (ref 12.0–15.0)
MCH: 30.1 pg (ref 26.0–34.0)
MCH: 29.3 pg (ref 26.0–34.0)
MCHC: 31.8 g/dL (ref 30.0–36.0)
MCHC: 32 g/dL (ref 30.0–36.0)
MCV: 94.7 fL (ref 78.0–100.0)
MCV: 91.6 fL (ref 78.0–100.0)
PLATELETS: 131 10*3/uL — AB (ref 150–400)
PLATELETS: 124 10*3/uL — AB (ref 150–400)
RBC: 2.26 MIL/uL — ABNORMAL LOW (ref 3.87–5.11)
RBC: 2.73 MIL/uL — ABNORMAL LOW (ref 3.87–5.11)
RDW: 17 % — AB (ref 11.5–15.5)
RDW: 19.2 % — AB (ref 11.5–15.5)
WBC: 12.3 10*3/uL — ABNORMAL HIGH (ref 4.0–10.5)
WBC: 15.3 10*3/uL — ABNORMAL HIGH (ref 4.0–10.5)

## 2015-08-17 LAB — OCCULT BLOOD X 1 CARD TO LAB, STOOL: FECAL OCCULT BLD: POSITIVE — AB

## 2015-08-17 LAB — HAPTOGLOBIN: Haptoglobin: 70 mg/dL (ref 34–200)

## 2015-08-17 LAB — PATHOLOGIST SMEAR REVIEW

## 2015-08-17 LAB — PREPARE RBC (CROSSMATCH)

## 2015-08-17 MED ORDER — SODIUM CHLORIDE 0.9 % IV SOLN
Freq: Once | INTRAVENOUS | Status: AC
  Administered 2015-08-17: 06:00:00 via INTRAVENOUS

## 2015-08-17 MED ORDER — FUROSEMIDE 10 MG/ML IJ SOLN
20.0000 mg | Freq: Once | INTRAMUSCULAR | Status: AC
  Administered 2015-08-17: 20 mg via INTRAVENOUS
  Filled 2015-08-17: qty 2

## 2015-08-17 MED ORDER — PANTOPRAZOLE SODIUM 40 MG PO TBEC
40.0000 mg | DELAYED_RELEASE_TABLET | Freq: Two times a day (BID) | ORAL | Status: DC
  Administered 2015-08-17 – 2015-08-19 (×4): 40 mg via ORAL
  Filled 2015-08-17 (×4): qty 1

## 2015-08-17 MED ORDER — PEG 3350-KCL-NA BICARB-NACL 420 G PO SOLR
4000.0000 mL | Freq: Once | ORAL | Status: DC
  Filled 2015-08-17: qty 4000

## 2015-08-17 NOTE — Progress Notes (Signed)
Utilization review completed. Haneef Hallquist, RN, BSN. 

## 2015-08-17 NOTE — Progress Notes (Signed)
  Echocardiogram 2D Echocardiogram has been performed.  Darlina Sicilian M 08/17/2015, 10:50 AM

## 2015-08-17 NOTE — Progress Notes (Signed)
CRITICAL VALUE ALERT  Critical value received:  Hemoglobin 6.8  Date of notification:  08/17/2015   Time of notification:  0420  Critical value read back:Yes.    Nurse who received alert:  Reap, Jon Gills   MD notified (1st page):  [place]  Time of first page:  4:22 AM   MD notified (2nd page):  Time of second page:  Responding MD:    Time MD responded:

## 2015-08-17 NOTE — Progress Notes (Addendum)
[facility] PROGRESS NOTE  April Mueller [id number] DOB: 02-Dec-1945 DOA: 08/16/2015 PCP:  Melinda Crutch, MD   [phone] with known chronic iron defi anemia followed by Dr.Kale, suspected to have MDS based on smear, Diatsolic CHF/Pulm HTN, chronic hypoxemic respiratory failure,  chronic kidney disease 3, rheumatoid arthritis on Plaquenil, sleep apnea and CPAP, hypertension and obesity admitted with weakness, dyspnea, found to have Hb of 5.2, down from 9. Admitted, given 3units PRBCs, hemoccult positive today, GI Consulted  Assessment/Plan: 1. Acute on chronic Iron defi anemia -[facility]ale [facility], suspected to have a degree of MDS based of smear, was getting Iron infusions and Epo, baseline Hb 8.5-10, admitted wth hb of 5.2 -no overt bleeding, LDH slightly elevated and haptoglobin 70, hence not s/o hemolysis -s/p 3units PRBC since admission yetserday, give a dose if IV lasix now -Hb 8 now, hemoccult positive for Blood, Iv PPI, GI consulted, d/w [facility], remote h/o colonoscopy in [place] about 5-10years ago and had polyps removed per pt report, BUN up, concerning for upper source  2. CKD 3 -creatinine stable at baseline, monitor  3. Chronic diastolic CHF/Pulm HTN -compensated, lasix and toprol on hold, resume Toprol slowly as BP tolerates -ordered 1 dose of Iv lasix post transfusion -followed by dr.Bensimhon/[place]  4. Mild thrombocytopenia -chronic, could be due to MDS   5. HTN -BP soft, hold amlodipine, resume Toprol when able  6. Rheumatoid arthritis  -stable,  -In setting of worsening of underlying anemia discontinued Plaquenil, FU with Rheum  7. OSA on CPAP -CPAP QHS   DVt proph: SCDs  Code Status: Full Code Family Communication: none at bedside Disposition Plan: Keep in SDU today   Consultants: GI Dr.Mann  HPI/Subjective: Feels a little stronger after blood, 3rd Unit infusing  Objective: Filed Vitals:   08/17/15 0907 08/17/15 1200  BP:  94/51 120/78  Pulse:  60  Temp: 98.5 F (36.9 C) 98.7 F (37.1 C)  Resp:  23    Intake/Output Summary (Last 24 hours) at 08/17/15 1412 Last data filed at 08/16/15 2048  Gross per 24 hour  Intake    668 ml  Output   1000 ml  Net   -332 ml   Filed Weights   08/16/15 1010 08/16/15 1832  Weight: 96.163 kg (212 lb) 95.7 kg (210 lb 15.7 oz)    Exam:   General:  AAOx3  Cardiovascular: [id number], diastolic murmur  Respiratory: scattered basilar ronchi  Abdomen: soft, NT, BS present  Musculoskeletal: no edema c/c   Data Reviewed: Basic Metabolic Panel:  Recent Labs Lab 08/16/15 1030 08/16/15 1224 08/17/15 0224  NA 135  --  136  K 5.6* 5.3* 5.3*  CL 104  --  109  CO2 22  --  19*  GLUCOSE 165*  --  200*  BUN 98*  --  96*  CREATININE 2.76*  --  2.29*  CALCIUM 10.0  --  9.4   Liver Function Tests: No results for input(s): AST, ALT, ALKPHOS, BILITOT, PROT, ALBUMIN in the last 168 hours. No results for input(s): LIPASE, AMYLASE in the last 168 hours. No results for input(s): AMMONIA in the last 168 hours. CBC:  Recent Labs Lab 08/16/15 1030 08/16/15 2220 08/17/15 0224 08/17/15 1250  WBC 14.4* 13.4* 12.3* 15.3*  NEUTROABS 12.1*  --   --   --   HGB 5.2* 7.0* 6.8* 8.0*  HCT 16.8* 21.6* 21.4* 25.0*  MCV 99.4 95.6 94.7 91.6  PLT 175 126* 131* 124*  Cardiac Enzymes: No results for input(s): CKTOTAL, CKMB, CKMBINDEX, TROPONINI in the last 168 hours. BNP (last 3 results)  Recent Labs  01/17/15 1230 04/21/15 1029  BNP 291.4* 318.9*    ProBNP (last 3 results)  Recent Labs  12/17/14 1612  PROBNP 447.0*    CBG:  Recent Labs Lab 08/16/15 1540  GLUCAP 121*    Recent Results (from the past 240 hour(s))  MRSA PCR Screening     Status: None   Collection Time: 08/16/15  7:19 PM  Result Value Ref Range Status   MRSA by PCR NEGATIVE NEGATIVE Final    Comment:        The GeneXpert MRSA Assay (FDA approved for NASAL specimens only), is one component  of a comprehensive MRSA colonization surveillance program. It is not intended to diagnose MRSA infection nor to guide or monitor treatment for MRSA infections.      Studies: Dg Chest 2 View  08/16/2015  CLINICAL DATA:  Weakness, shortness of breath for 5 days EXAM: CHEST  2 VIEW COMPARISON:  12/17/2014 FINDINGS: Cardiomediastinal silhouette is stable. Again noted dilated aortic arch and descending aorta. Status post median sternotomy. Stable right perihilar scarring. Central mild vascular congestion without convincing pulmonary edema. Mild left basilar atelectasis. Mild degenerative changes thoracic spine. No definite superimposed infiltrate or pulmonary edema. IMPRESSION: Cardiomegaly again noted. 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 Status post median sternotomy. Stable scarring right midlung. Central mild vascular congestion without pulmonary edema. Mild left basilar atelectasis. No definite superimposed infiltrate. Electronically Signed   By: Lahoma Crocker M.D.   On: 08/16/2015 11:38    Scheduled Meds: . antiseptic oral rinse  7 mL Mouth Rinse BID  . pantoprazole (PROTONIX) IV  40 mg Intravenous Q12H  . 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 If 7PM-7AM, please contact night-coverage at [email or web address], password [facility] 08/17/2015, 2:12 PM  LOS: 1 day

## 2015-08-17 NOTE — Consult Note (Signed)
[facility] Consult: 2:43 PM 08/17/2015  LOS: 1 day    Referring Provider: Dr Broadus John  Primary Care Physician:   Melinda Crutch, MD Primary Gastroenterologist:  Dr. Henrene Pastor     Reason for Consultation:    Acute on chronic anemia, FOBT *   HPI: April Mueller is a 70 y.o. female.  Multiple medical issues.  CKD stage 3.  OSA on CPAP.  Mitral regurge (delined for TAVR in [place]). Pulm htn on home oxygen. S/p repair aortic aneurysm. Rheumatoid arthritis.    04/04/15 GI ROV with APP Zehr for anemia.  Required  PRBCs x 2 in 03/2015 for Hgb 7.7.  Macrocytic at 110.  No bloody or black stools (FOBT neg on GI rectal exam) and innocuous GI ROV.  ? Of previous out of state colonoscopy ~ 2011 or greater.  Pt deemed by Dr Henrene Pastor at high risk for sedation and endoscopic procedures, was referred to hematologist.  Consideration for virtual colonoscopy entertained if heme desired GI eval necessary.   Dr Madaline Savage, hematologist,  Initiated parenteral iron 04/2015 (2 doses in 04/2015) and Aranesp 07/19/15.  She has been taking oral iron supplements for many years but never required transfusion of blood before 2016.  Admitted yesterday with increasing DOE, fatigue, malaise. Exertional chest pressure.  Hg measure 5.2, was 9.2 on 07/19/15.  MCV 91.  Platelets 124.  WBCs 15.3. Ferritin 17 with low iron at iron sats.  S/p PRBCs x 3.    Stool is FOBT +.    BNP is high at 318, no cardiac enzymes collected.  CXR: CM with stable aortic aneurysm, vascular congestion but no overt edema.    Last week the patient take some eggplant Parmesan, she breaded and fried B eggplant eggplant as part of the recipe.  Each time she would eat this, she developed watery stools. Her stools are chronically brown/black but never bloody or maroon. These loose stools last  week were also black in color. Patient denies abdominal pain. She has had increased flatus but not increased belching. No anorexia, no nausea or vomiting. No dysphagia. No pyrosis. Patient does not take H2 blocker or PPI as she doesn't need these.    Past Medical History  Diagnosis Date  . Hypertension   . Pulmonary HTN ([place])   . Aortic aneurysm ([place])   . Sleep apnea     wears CPAP  . CKD (chronic kidney disease) stage 3, GFR 30-59 ml/min   . Anemia 03/23/15    transfusion  . Arthritis   . CHF (congestive heart failure) ([place])   . Gout   . Rheumatoid arthritis(714.0) 11/06/2012    Past Surgical History  Procedure Laterality Date  . Abdominal aortic aneurysm repair    . Right heart catheterization N/A 08/18/2014    Procedure: RIGHT HEART CATH;  Surgeon: Jolaine Artist, MD;  Location: [facility] CATH LAB;  Service: Cardiovascular;  Laterality: N/A;  . Tee without cardioversion N/A 01/05/2015    Procedure: TRANSESOPHAGEAL ECHOCARDIOGRAM (TEE);  Surgeon: Larey Dresser, MD;  Location: [facility];  Service: Cardiovascular;  Laterality: N/A;  . Wisdom tooth extraction      Prior to Admission medications   Medication Sig Start Date End Date Taking? Authorizing Provider  allopurinol (ZYLOPRIM) 300 MG tablet Take 300 mg by mouth daily.   Yes Historical Provider, MD  ALPRAZolam (XANAX) 0.25 MG tablet Take 0.25 mg by mouth at bedtime.    Yes Historical Provider, MD  amLODipine (NORVASC) 5 MG tablet Take 0.5 tablets (2.5 mg total) by mouth daily. 04/21/15  Yes Larey Dresser, MD  aspirin EC 81 MG tablet Take 1 tablet (81 mg total) by mouth daily. 05/23/15  Yes Larey Dresser, MD  atorvastatin (LIPITOR) 10 MG tablet Take 1 tablet (10 mg total) by mouth daily. 03/22/15  Yes Larey Dresser, MD  b complex vitamins tablet Take 1 tablet by mouth daily.   Yes Historical Provider, MD  ferrous sulfate (SLOW FE) 160 (50 FE) MG TBCR SR tablet Take 160 mg by mouth daily.   Yes Historical Provider, MD    FLUOCINOLONE ACETONIDE BODY 0.01 % OIL Apply 1 mL topically 2 (two) times daily as needed (for flares).  12/17/14  Yes Historical Provider, MD  FLUOCINOLONE ACETONIDE SCALP 0.01 % OIL Apply 1 application topically 2 (two) times daily as needed (for flares).  05/06/15  Yes Historical Provider, MD  furosemide (LASIX) 40 MG tablet Take 40 mg by mouth 2 (two) times daily.   Yes Historical Provider, MD  hydroxychloroquine (PLAQUENIL) 200 MG tablet Take 200 mg by mouth 2 (two) times daily.   Yes Historical Provider, MD  ketoconazole (NIZORAL) 2 % shampoo Apply 1 application topically every 14 (fourteen) days. Leave on 5 minutes before rinsing out 12/15/14  Yes Historical Provider, MD  lidocaine (LIDODERM) 5 % Place 1 patch onto the skin daily as needed (for pain).  06/14/15  Yes Historical Provider, MD  metoprolol succinate (TOPROL-XL) 100 MG 24 hr tablet Take 1 tablet (100 mg total) by mouth 2 (two) times daily. 03/21/15  Yes Jolaine Artist, MD  Omega-3 Fatty Acids (FISH OIL) 1200 MG CAPS Take 1,200 mg by mouth daily.   Yes Historical Provider, MD  OXYGEN Inhale 4-5 L into the lungs continuous.    Yes Historical Provider, MD  Potassium Chloride ER 20 MEQ TBCR Take 20 mEq by mouth daily. 01/06/15  Yes Larey Dresser, MD  spironolactone (ALDACTONE) 25 MG tablet Take 1 tablet (25 mg total) by mouth daily. 12/29/14  Yes Larey Dresser, MD    Scheduled Meds: . antiseptic oral rinse  7 mL Mouth Rinse BID  . furosemide  20 mg Intravenous Once  . pantoprazole (PROTONIX) IV  40 mg Intravenous Q12H  . sodium chloride  3 mL Intravenous Q12H   Infusions:   PRN Meds: acetaminophen **OR** acetaminophen, ALPRAZolam, ondansetron **OR** ondansetron (ZOFRAN) IV   Allergies as of 08/16/2015  . (No Known Allergies)    Family History  Problem Relation Age of Onset  . Heart attack Mother   . Prostate cancer Father   . Diabetes Brother   . Kidney failure Brother     Social History   Social History  .  Marital Status: Legally Separated    Spouse Name: N/A  . Number of Children: 1  . Years of Education: N/A   Occupational History  . Disability     Office Work   Social History Main Topics  . Smoking status: Former Smoker -- 1.50 packs/day for 14 years    Types: Cigarettes  Quit date: 08/09/2003  . Smokeless tobacco: Never Used  . Alcohol Use: No  . Drug Use: No  . Sexual Activity: Not Currently   Other Topics Concern  . Not on file   Social History Narrative    REVIEW OF SYSTEMS: Constitutional:  Weak, feels better today after the transfusion. ENT:  No nose bleeds Pulm:  Dyspnea feels better today but she's not been ambulatory. CV:  No palpitations, no LE edema. Chest pain is gone. GU:  No hematuria, no frequency GI:  Per history of present illness Heme:  Per history of present illness.   Transfusions:  Per history of present illness. Neuro:  No headaches, no peripheral tingling or numbness Derm:  No itching, no rash or sores.  Endocrine:  No sweats or chills.  No polyuria or dysuria Immunization:  Did not inquire as to immunizations status. Travel:  None beyond local counties in last few months.    PHYSICAL EXAM: Vital signs in last 24 hours: Filed Vitals:   08/17/15 0907 08/17/15 1200  BP: 94/51 120/78  Pulse:  60  Temp: 98.5 F (36.9 C) 98.7 F (37.1 C)  Resp:  23   Wt Readings from Last 3 Encounters:  08/16/15 95.7 kg (210 lb 15.7 oz)  06/21/15 98.022 kg (216 lb 1.6 oz)  05/23/15 97.07 kg (214 lb)    General: Obese, comfortable, pleasant, chronically ill-appearing AAF. Head:  Somewhat cushingoid facies. Face is symmetric. No signs of head trauma.  Eyes:  No scleral icterus, no conjunctival pallor. EOMI. Ears:  Not HOH  Nose:  No congestion, no discharge. Mouth:  Clear, moist. Neck:  No JVD, no masses, no TMG Lungs:  Clear bilaterally with reduced breath sounds throughout. No labored breathing or cough. Heart: RRR. Soft holosystolic murmur. S1/S2  audible. Abdomen:  Soft, obese, not tender. Bowel sounds active. No appreciable HSM or masses. No hernias..   Rectal: Pasty, very black but greenish cast to the color, stool tests 2-3+ FOBT+   Musc/Skeltl: No gross joint swelling or contracture deformities Extremities:  No CCE.  Neurologic:  Patient is alert. Oriented 3. Good historian. Moves all 4 limbs, limb strength not tested. No tremors or gross neurologic deficits. Skin:  No rashes, no sores, no suspicious lesions Tattoos:  None observed Nodes:  No cervical adenopathy   Psych:  Pleasant, calm, cooperative.  Intake/Output from previous day: 01/10 0701 - 01/11 0700 In: 918 [I.V.:3; Blood:915] Out: 1000 [Urine:1000] Intake/Output this shift:    LAB RESULTS:  Recent Labs  08/16/15 2220 08/17/15 0224 08/17/15 1250  WBC 13.4* 12.3* 15.3*  HGB 7.0* 6.8* 8.0*  HCT 21.6* 21.4* 25.0*  PLT 126* 131* 124*   BMET Lab Results  Component Value Date   NA 136 08/17/2015   NA 135 08/16/2015   NA 142 06/21/2015   K 5.3* 08/17/2015   K 5.3* 08/16/2015   K 5.6* 08/16/2015   CL 109 08/17/2015   CL 104 08/16/2015   CL 108 04/21/2015   CO2 19* 08/17/2015   CO2 22 08/16/2015   CO2 25 06/21/2015   GLUCOSE 200* 08/17/2015   GLUCOSE 165* 08/16/2015   GLUCOSE 113 06/21/2015   BUN 96* 08/17/2015   BUN 98* 08/16/2015   BUN 45.6* 06/21/2015   CREATININE 2.29* 08/17/2015   CREATININE 2.76* 08/16/2015   CREATININE 2.8* 06/21/2015   CALCIUM 9.4 08/17/2015   CALCIUM 10.0 08/16/2015   CALCIUM 10.7* 06/21/2015   LFT No results for input(s): PROT, ALBUMIN, AST, ALT, ALKPHOS, BILITOT,  BILIDIR, IBILI in the last 72 hours. PT/INR Lab Results  Component Value Date   INR 1.11 08/09/2014   Hepatitis Panel No results for input(s): HEPBSAG, HCVAB, HEPAIGM, HEPBIGM in the last 72 hours. C-Diff No components found for: CDIFF Lipase  No results found for: LIPASE  Drugs of Abuse  No results found for: LABOPIA, COCAINSCRNUR, LABBENZ,  AMPHETMU, THCU, LABBARB   RADIOLOGY STUDIES: Dg Chest 2 View  08/16/2015  CLINICAL DATA:  Weakness, shortness of breath for 5 days EXAM: CHEST  2 VIEW COMPARISON:  12/17/2014 FINDINGS: Cardiomediastinal silhouette is stable. Again noted dilated aortic arch and descending aorta. Status post median sternotomy. Stable right perihilar scarring. Central mild vascular congestion without convincing pulmonary edema. Mild left basilar atelectasis. Mild degenerative changes thoracic spine. No definite superimposed infiltrate or pulmonary edema. IMPRESSION: Cardiomegaly again noted. 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 Status post median sternotomy. Stable scarring right midlung. Central mild vascular congestion without pulmonary edema. Mild left basilar atelectasis. No definite superimposed infiltrate. Electronically Signed   By: Lahoma Crocker M.D.   On: 08/16/2015 11:38    ENDOSCOPIC STUDIES: No records of any.  May have had colon/egd greater than 5 yrs ago in [place].  IMPRESSION:   *  Acute on chronic anemia.  Patient on chronic PO iron for many years. Infused twice with parenteral iron in 04/2015. Received Aranesp at the beginning of 07/2015. Despite these measures she has developed acute drop in her hemoglobin. FOBT + stool. Currently with very dark stools but patient says they are chronically dark. She did have diarrhea associated with eating what is probably a greasy food load of eggplant Parmesan last week, it resolved quickly, do not think that this is related to the GI blood loss.  *  Severe mitral valve regurg, not a operative candidate.  *  Pulmonary hypertension. On chronic oxygen. Obstructive sleep apnea on CPAP. Due to cardiopulmonary issues she is high risk for sedation.  *  Stage 3 CKD    PLAN:     *  Per Dr Joanie Coddington  08/17/2015, 2:43 PM Pager: [phone]

## 2015-08-18 ENCOUNTER — Encounter (HOSPITAL_COMMUNITY)
Admission: EM | Disposition: A | Discharge status: Home / Self Care | Payer: Self-pay | Source: Home / Self Care | Attending: Internal Medicine

## 2015-08-18 DIAGNOSIS — I272 Other secondary pulmonary hypertension: Secondary | ICD-10-CM

## 2015-08-18 DIAGNOSIS — D62 Acute posthemorrhagic anemia: Secondary | ICD-10-CM

## 2015-08-18 DIAGNOSIS — G4733 Obstructive sleep apnea (adult) (pediatric): Secondary | ICD-10-CM

## 2015-08-18 DIAGNOSIS — J9611 Chronic respiratory failure with hypoxia: Secondary | ICD-10-CM

## 2015-08-18 DIAGNOSIS — I10 Essential (primary) hypertension: Secondary | ICD-10-CM

## 2015-08-18 DIAGNOSIS — D649 Anemia, unspecified: Secondary | ICD-10-CM

## 2015-08-18 DIAGNOSIS — R195 Other fecal abnormalities: Secondary | ICD-10-CM | POA: Insufficient documentation

## 2015-08-18 DIAGNOSIS — I5032 Chronic diastolic (congestive) heart failure: Secondary | ICD-10-CM

## 2015-08-18 LAB — TYPE AND SCREEN
ABO/RH(D): O POS
ANTIBODY SCREEN: NEGATIVE
UNIT DIVISION: 0
UNIT DIVISION: 0
Unit division: 0

## 2015-08-18 LAB — CBC
HCT: 23.8 % — ABNORMAL LOW (ref 36.0–46.0)
HEMATOCRIT: 23.8 % — AB (ref 36.0–46.0)
HEMOGLOBIN: 7.8 g/dL — AB (ref 12.0–15.0)
Hemoglobin: 7.4 g/dL — ABNORMAL LOW (ref 12.0–15.0)
MCH: 28.6 pg (ref 26.0–34.0)
MCH: 30.1 pg (ref 26.0–34.0)
MCHC: 31.1 g/dL (ref 30.0–36.0)
MCHC: 32.8 g/dL (ref 30.0–36.0)
MCV: 91.9 fL (ref 78.0–100.0)
MCV: 91.9 fL (ref 78.0–100.0)
PLATELETS: 127 10*3/uL — AB (ref 150–400)
Platelets: 122 10*3/uL — ABNORMAL LOW (ref 150–400)
RBC: 2.59 MIL/uL — ABNORMAL LOW (ref 3.87–5.11)
RBC: 2.59 MIL/uL — ABNORMAL LOW (ref 3.87–5.11)
RDW: 19.5 % — AB (ref 11.5–15.5)
RDW: 19.6 % — ABNORMAL HIGH (ref 11.5–15.5)
WBC: 14.2 10*3/uL — ABNORMAL HIGH (ref 4.0–10.5)
WBC: 15.7 10*3/uL — ABNORMAL HIGH (ref 4.0–10.5)

## 2015-08-18 LAB — BASIC METABOLIC PANEL
ANION GAP: 6 (ref 5–15)
BUN: 88 mg/dL — ABNORMAL HIGH (ref 6–20)
CHLORIDE: 112 mmol/L — AB (ref 101–111)
CO2: 21 mmol/L — ABNORMAL LOW (ref 22–32)
CREATININE: 2.17 mg/dL — AB (ref 0.44–1.00)
Calcium: 9.6 mg/dL (ref 8.9–10.3)
GFR calc non Af Amer: 22 mL/min — ABNORMAL LOW (ref >60)
GFR, EST AFRICAN AMERICAN: 26 mL/min — AB (ref >60)
Glucose, Bld: 158 mg/dL — ABNORMAL HIGH (ref 65–99)
Potassium: 5 mmol/L (ref 3.5–5.1)
SODIUM: 139 mmol/L (ref 135–145)

## 2015-08-18 SURGERY — COLONOSCOPY
Anesthesia: Moderate Sedation | Laterality: Left

## 2015-08-18 MED ORDER — PEG 3350-KCL-NABCB-NACL-NASULF 236 G PO SOLR
2000.0000 mL | Freq: Once | ORAL | Status: AC
  Administered 2015-08-19: 2000 mL via ORAL
  Filled 2015-08-18: qty 4000

## 2015-08-18 MED ORDER — DARBEPOETIN ALFA 40 MCG/0.4ML IJ SOSY
40.0000 ug | PREFILLED_SYRINGE | INTRAMUSCULAR | Status: DC
  Administered 2015-08-23: 40 ug via SUBCUTANEOUS
  Filled 2015-08-18 (×2): qty 0.4

## 2015-08-18 MED ORDER — PEG-KCL-NACL-NASULF-NA ASC-C 100 G PO SOLR: 1.0000 | Freq: Once | ORAL | Status: DC

## 2015-08-18 MED ORDER — SODIUM CHLORIDE 0.9 % IV SOLN
25.0000 mg | Freq: Once | INTRAVENOUS | Status: AC
  Administered 2015-08-18: 25 mg via INTRAVENOUS
  Filled 2015-08-18: qty 0.5

## 2015-08-18 MED ORDER — ATORVASTATIN CALCIUM 10 MG PO TABS
10.0000 mg | ORAL_TABLET | Freq: Every day | ORAL | Status: DC
Start: 2015-08-18 — End: 2015-08-25
  Administered 2015-08-18 – 2015-08-25 (×8): 10 mg via ORAL
  Filled 2015-08-18 (×8): qty 1

## 2015-08-18 MED ORDER — PEG 3350-KCL-NABCB-NACL-NASULF 236 G PO SOLR
2000.0000 mL | Freq: Once | ORAL | Status: AC
  Administered 2015-08-18: 2000 mL via ORAL
  Filled 2015-08-18: qty 4000

## 2015-08-18 MED ORDER — SODIUM CHLORIDE 0.9 % IV SOLN
500.0000 mg | INTRAVENOUS | Status: AC
  Administered 2015-08-18: 500 mg via INTRAVENOUS
  Filled 2015-08-18 (×2): qty 10

## 2015-08-18 NOTE — Progress Notes (Signed)
Patient has home CPAP machine with mask and tubing. Patient places herself on when ready.

## 2015-08-18 NOTE — Consult Note (Signed)
Advanced Heart Failure Team Consult Note  Referring Physician: Dr Hilarie Fredrickson Primary Physician: Dr Harrington Challenger Primary HF Cardiologist:  Dr Aundra Dubin   Reason for Consultation: Pulmonary Hypertension  HPI:    April Mueller is a 70 yo with  COPD, former smoker (28 pk-yrs), Obesity,  RA , CKD 4, Type A aortic dissection s/p repair 2005,--c/b renal failure requiring HD x 3 months,  PAH - previously treated in [place]. Admitted with profound iron deficiency anemia. GI has seen and wants to scope. Asking for cardiology input.   Last echo 08/17/15. LVEF 55-60% D-shaped septum with RV strain. RVSP 66. Has chronic NYHA III symptoms. 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 Recently stopped macitentan.   [place] 08/18/2014: RA = 14 RV = 62/3/15 PA = 63/21 (38) PCW = mean 23 v waves to 40 Fick cardiac output/index = 6.3/3.2 Thermodilution CO/CI = 6.1/3.1, PVR = 2.0 WU, FA sat = 91% PA sat = 57%, 62%  On admit Hgb 5.2. Received 3 UPRBCs to date.Has had black stools at home but attributes to her iron. No BRBPR or frank melena.  Feels better. GI consulted with plans for EGD/Colonoscopy.  Denies CP or SOB.    Review of Systems: [y] = yes, [ ]  = no   General: Weight gain [ ] ; Weight loss [ ] ; Anorexia [ ] ; Fatigue [Y ]; Fever [ ] ; Chills [ ] ; Weakness [Y ]  Cardiac: Chest pain/pressure [ ] ; Resting SOB [ ] ; Exertional SOB [Y ]; Orthopnea [ ] ; Pedal Edema Blue.Reese ]; Palpitations [ ] ; Syncope [ ] ; Presyncope [ ] ; Paroxysmal nocturnal dyspnea[ ]   Pulmonary: Cough [ ] ; Wheezing[ ] ; Hemoptysis[ ] ; Sputum [ ] ; Snoring [ y]  GI: Vomiting[ ] ; Dysphagia[ ] ; Melena[ ] ; Hematochezia [ ] ; Heartburn[ ] ; Abdominal pain [ ] ; Constipation [ ] ; Diarrhea [ ] ; BRBPR [Y ]  GU: Hematuria[ ] ; Dysuria [ ] ; Nocturia[ ]   Vascular: Pain in legs with walking [ ] ; Pain in feet with lying flat [ ] ; Non-healing sores [ ] ; Stroke [ ] ; TIA [ ] ; Slurred speech [ ] ;  Neuro: Headaches[ ] ; Vertigo[ ] ; Seizures[ ] ; Paresthesias[ ] ;Blurred vision [ ] ; Diplopia  [ ] ; Vision changes [ ]   Ortho/Skin: Arthritis Blue.Reese ]; Joint pain [Y ]; Muscle pain [ ] ; Joint swelling [ ] ; Back Pain [ ] ; Rash [ ]   Psych: Depression[ ] ; Anxiety[ ]   Heme: Bleeding problems [ y]; Clotting disorders [ ] ; Anemia Blue.Reese ]  Endocrine: Diabetes [ ] ; Thyroid dysfunction[ ]   Home Medications Prior to Admission medications   Medication Sig Start Date End Date Taking? Authorizing Provider  allopurinol (ZYLOPRIM) 300 MG tablet Take 300 mg by mouth daily.   Yes Historical Provider, MD  ALPRAZolam (XANAX) 0.25 MG tablet Take 0.25 mg by mouth at bedtime.    Yes Historical Provider, MD  amLODipine (NORVASC) 5 MG tablet Take 0.5 tablets (2.5 mg total) by mouth daily. 04/21/15  Yes Larey Dresser, MD  aspirin EC 81 MG tablet Take 1 tablet (81 mg total) by mouth daily. 05/23/15  Yes Larey Dresser, MD  atorvastatin (LIPITOR) 10 MG tablet Take 1 tablet (10 mg total) by mouth daily. 03/22/15  Yes Larey Dresser, MD  b complex vitamins tablet Take 1 tablet by mouth daily.   Yes Historical Provider, MD  ferrous sulfate (SLOW FE) 160 (50 FE) MG TBCR SR tablet Take 160 mg by mouth daily.   Yes Historical Provider, MD  FLUOCINOLONE ACETONIDE BODY 0.01 %  OIL Apply 1 mL topically 2 (two) times daily as needed (for flares).  12/17/14  Yes Historical Provider, MD  FLUOCINOLONE ACETONIDE SCALP 0.01 % OIL Apply 1 application topically 2 (two) times daily as needed (for flares).  05/06/15  Yes Historical Provider, MD  furosemide (LASIX) 40 MG tablet Take 40 mg by mouth 2 (two) times daily.   Yes Historical Provider, MD  hydroxychloroquine (PLAQUENIL) 200 MG tablet Take 200 mg by mouth 2 (two) times daily.   Yes Historical Provider, MD  ketoconazole (NIZORAL) 2 % shampoo Apply 1 application topically every 14 (fourteen) days. Leave on 5 minutes before rinsing out 12/15/14  Yes Historical Provider, MD  lidocaine (LIDODERM) 5 % Place 1 patch onto the skin daily as needed (for pain).  06/14/15  Yes Historical Provider,  MD  metoprolol succinate (TOPROL-XL) 100 MG 24 hr tablet Take 1 tablet (100 mg total) by mouth 2 (two) times daily. 03/21/15  Yes Jolaine Artist, MD  Omega-3 Fatty Acids (FISH OIL) 1200 MG CAPS Take 1,200 mg by mouth daily.   Yes Historical Provider, MD  OXYGEN Inhale 4-5 L into the lungs continuous.    Yes Historical Provider, MD  Potassium Chloride ER 20 MEQ TBCR Take 20 mEq by mouth daily. 01/06/15  Yes Larey Dresser, MD  spironolactone (ALDACTONE) 25 MG tablet Take 1 tablet (25 mg total) by mouth daily. 12/29/14  Yes Larey Dresser, MD    Past Medical History: Past Medical History  Diagnosis Date  . Hypertension   . Pulmonary HTN ([place])   . Aortic aneurysm ([place])   . Sleep apnea     wears CPAP  . CKD (chronic kidney disease) stage 3, GFR 30-59 ml/min   . Anemia 03/23/15    transfusion  . Arthritis   . CHF (congestive heart failure) ([place])   . Gout   . Rheumatoid arthritis(714.0) 11/06/2012    Past Surgical History: Past Surgical History  Procedure Laterality Date  . Abdominal aortic aneurysm repair    . Right heart catheterization N/A 08/18/2014    Procedure: RIGHT HEART CATH;  Surgeon: Jolaine Artist, MD;  Location: [facility] C[facility] LAB;  Service: Cardiovascular;  Laterality: N/A;  . Tee without cardioversion N/A 01/05/2015    Procedure: TRANSESOPHAGEAL ECHOCARDIOGRAM (TEE);  Surgeon: Larey Dresser, MD;  Location[facility] ENDOSCOPY;  Service: Cardiovascular;  Laterality: N/A;  . Wisdom tooth extraction      Family History: Family History  Problem Relation Age of Onset  . Heart attack Mother   . Prostate cancer Father   . Diabetes Brother   . Kidney failure Brother     Social History: Social History   Social History  . Marital Status: Legally Separated    Spouse Name: N/A  . Number of Children: 1  . Years of Education: N/A   Occupational History  . Disability     Office Work   Social History Main Topics  . Smoking status: Former Smoker -- 1.50 packs/day for 14 years     Types: Cigarettes    Quit date: 08/09/2003  . Smokeless tobacco: Never Used  . Alcohol Use: No  . Drug Use: No  . Sexual Activity: Not Currently   Other Topics Concern  . None   Social History Narrative    Allergies:  No Known Allergies  Objective:    Vital Signs:   Temp:  [97.6 F (36.4 C)-98.8 F (37.1 C)] 98.5 F (36.9 C) (01/12 1213) Pulse Rate:  [51-61] 51 (  01/12 1213) Resp:  [18-26] 19 (01/12 1213) BP: (92-109)/(48-67) 99/54 mmHg (01/12 1213) SpO2:  [100 %] 100 % (01/12 1213) Last BM Date: 08/17/15  Weight change: Filed Weights   08/16/15 1010 08/16/15 1832  Weight: 212 lb (96.163 kg) 210 lb 15.7 oz (95.7 kg)    Intake/Output:   Intake/Output Summary (Last 24 hours) at 08/18/15 1338 Last data filed at 08/18/15 0500  Gross per 24 hour  Intake    335 ml  Output   1050 ml  Net   -715 ml     Physical Exam: General:  Lying in bed. On CPAP. NAD HEENT: normal Neck: supple. Thick hard to see JVP  . Carotids 2+ bilat; no bruits. No lymphadenopathy or thryomegaly appreciated. Cor: PMI nondisplaced. Regular rate & rhythm. 2/6 TR Lungs: minimal crackles at bases. Decreased BS throughout Abdomen: soft, obese nontender, nondistended. No hepatosplenomegaly. No bruits or masses. Good bowel sounds. Extremities: no cyanosis, clubbing, rash, no edema Neuro: alert & orientedx3, cranial nerves grossly intact. moves all 4 extremities w/o difficulty. Affect pleasant  Telemetry: SR 68 No ST-T wave abnormalities.    Labs: Basic Metabolic Panel:  Recent Labs Lab 08/16/15 1030 08/16/15 1224 08/17/15 0224 08/18/15 0200  NA 135  --  136 139  K 5.6* 5.3* 5.3* 5.0  CL 104  --  109 112*  CO2 22  --  19* 21*  GLUCOSE 165*  --  200* 158*  BUN 98*  --  96* 88*  CREATININE 2.76*  --  2.29* 2.17*  CALCIUM 10.0  --  9.4 9.6    Liver Function Tests: No results for input(s): AST, ALT, ALKPHOS, BILITOT, PROT, ALBUMIN in the last 168 hours. No results for input(s):  LIPASE, AMYLASE in the last 168 hours. No results for input(s): AMMONIA in the last 168 hours.  CBC:  Recent Labs Lab 08/16/15 1030 08/16/15 2220 08/17/15 0224 08/17/15 1250 08/18/15 0200  WBC 14.4* 13.4* 12.3* 15.3* 14.2*  NEUTROABS 12.1*  --   --   --   --   HGB 5.2* 7.0* 6.8* 8.0* 7.4*  HCT 16.8* 21.6* 21.4* 25.0* 23.8*  MCV 99.4 95.6 94.7 91.6 91.9  PLT 175 126* 131* 124* 127*    Cardiac Enzymes: No results for input(s): CKTOTAL, CKMB, CKMBINDEX, TROPONINI in the last 168 hours.  BNP: BNP (last 3 results)  Recent Labs  01/17/15 1230 04/21/15 1029  BNP 291.4* 318.9*    ProBNP (last 3 results)  Recent Labs  12/17/14 1612  PROBNP 447.0*     CBG:  Recent Labs Lab 08/16/15 1540  GLUCAP 121*    Coagulation Studies: No results for input(s): LABPROT, INR in the last 72 hours.  Other results: EKG: NSR 68 No ST-T wave abnormalities.    Imaging:  No results found.   Medications:     Current Medications: . antiseptic oral rinse  7 mL Mouth Rinse BID  . atorvastatin  10 mg Oral Daily  . [START ON 08/23/2015] darbepoetin (ARANESP) injection - NON-DIALYSIS  40 mcg Subcutaneous Q Tue-1800  . iron dextran (INFED/DEXFERRUM) infusion  25 mg Intravenous Once  . pantoprazole  40 mg Oral BID  . sodium chloride  3 mL Intravenous Q12H     Infusions:      Assessment:   1. Symptomatic anemia 2. Likely GI bleeding 3. Pulmonary HTN 4. Chronic diastolic HF 5. CKD 4 6. Severe COPD   Plan/Discussion:    See MD note  Length of Stay: 2  Amy  Clegg 08/18/2015, 1:38 PM  Advanced Heart Failure Team Pager [phone] (M-F; 7a - 4p)  Please contact [facility] for night-coverage after hours (4p -7a ) and weekends on [email or web address]  Patient seen and examined with Darrick Grinder, NP. We discussed all aspects of the encounter. I agree with the assessment and plan as stated above.   Case discussed with Drs. Pyrtle and Hongagli. She has chronic diastolic HF,  moderate pulmonary HTN and severe COPD. Now admitted with profound iron-deficiency anemia. Has been seen by GI who has recommended upper and lower endoscopy. Given degree of anemia I think this is an important to undergo endoscopy. Given her COPD and PAH, I think she is at least moderate but not prohibitive risk for EGD and colonoscopy with conscious sedation. I discussed this with her and she wants to proceed.   Bensimhon, Daniel,MD 2:39 PM

## 2015-08-18 NOTE — Progress Notes (Signed)
PROGRESS NOTE    April Mueller [id number] DOB: 1945-10-01 DOA: 08/16/2015 PCP:  Melinda Crutch, MD  Hematology: Dr. Sullivan Lone. Cardiology: Dr. [name] Rheumatology: Dr. Gavin Pound Pulmonology: Dr. Baltazar Apo  HPI/Brief narrative 70 year old female patient with history of COPD, obesity, rheumatoid arthritis on Plaquenil, type A aortic dissection status post repair 2005, PAH, OSA on nightly CPAP, chronic diastolic CHF, chronic kidney disease IV, severe MR-not a good candidate for MV clip, anemia related to CKD/RA/MDS, chronic respiratory failure on home oxygen admitted to [facility] on 08/16/15 with symptomatic anemia and hemoglobin of 5.2. She has chronic black stools attributed to iron supplements. Patient was transfused 3 units of PRBCs. Baseline hemoglobin set to be in the 9-10 grams per DL range. [facility] was consulted and are awaiting on preprocedure cardiac clearance (options of EGD, colonoscopy or capsule study). Have placed a call for patient's primary hematologist as well.   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 - Follows with Dr. Irene Limbo, Hematology-have left a voicemail message to discuss. Patient was on iron infusions and Epogen. - Status post 3 units PRBC since admission. Hemoglobin improved from 5.2 to 8 but has dropped down again this morning to 7.4. Monitor closely and transfuse if hemoglobin <7 g per DL. - Anemia panel: Iron 13, TIBC 311, saturation ratio 4, ferritin 17, folate 42, B12: 539. Will provide a dose of IV iron per pharmacy. [facility] input appreciated and contemplating evaluation pending cardiology clearance.  FOBT +/possible GI bleed - [facility] input appreciated. - Evaluation in August 2016 by [facility]: Patient felt too high risk for procedures at that time. As per GI, she is certainly high risk for procedure  relating to sedation given her cardiopulmonary status. Discussed with Dr. Haroldine Laws will discuss with Dr. Hilarie Fredrickson and provide cardiology input.  Stage IV chronic kidney disease - Creatinine seems to be at baseline.  Chronic diastolic CHF/pulmonary hypertension - Compensated. Lasix and Toprol currently on hold.  Thrombocytopenia - Stable  Essential hypertension - Normal to soft blood pressures. Holding amlodipine and Toprol for now.  Rheumatoid arthritis - Follows with Dr. Gavin Pound as outpatient. Plaquenil held currently.  OSA on CPAP/chronic respiratory failure on home oxygen - Continue CPAP and oxygen supplementation.  Severe MR - Not candidate for surgical intervention.  Hyperkalemia - Resolved but progression is at high normal. Follow daily CBCs. Low potassium diet.   DVT prophylaxis: SCDs Code Status: Full Family Communication: None at bedside Disposition Plan: Continue management in stepdown unit. DC home when medically stable.   Consultants:  [facility]  Cardiology  Procedures:  Nightly CPAP  Antimicrobials:  None   Subjective: Feels better in some ways: Improved dizziness and lightheadedness. No chest pain or dyspnea reported. Chronic black stools-unchanged. No nausea or vomiting. Feels worse in some ways: Anxious and worried.  Objective: Filed Vitals:   08/18/15 0400 08/18/15 0513 08/18/15 1005 08/18/15 1213  BP: 92/55 109/64 108/66 99/54  Pulse:  57 55 51  Temp: 98 F (36.7 C)  97.6 F (36.4 C) 98.5 F (36.9 C)  TempSrc: Oral  Oral Oral  Resp:  18 19 19   Height:      Weight:      SpO2:   100% 100%    Intake/Output Summary (Last 24 hours) at 08/18/15 1245 Last data filed at 08/18/15 0500  Gross per 24 hour  Intake  335 ml  Output   1050 ml  Net   -715 ml   Filed Weights   08/16/15 1010 08/16/15 1832  Weight: 96.163 kg (212 lb) 95.7 kg (210 lb 15.7 oz)    Exam:  General exam: Pleasant middle-aged female lying comfortably  propped up in bed with CPAP on this morning. Respiratory system: Clear. No increased work of breathing. Cardiovascular system: S1 & S2 heard, RRR. No JVD, murmurs, gallops, clicks or pedal edema. Telemetry: SB in the 50s-SR. Gastrointestinal system: Abdomen is nondistended, soft and nontender. Normal bowel sounds heard. Central nervous system: Alert and oriented. No focal neurological deficits. Extremities: Symmetric 5 x 5 power.   Data Reviewed: Basic Metabolic Panel:  Recent Labs Lab 08/16/15 1030 08/16/15 1224 08/17/15 0224 08/18/15 0200  NA 135  --  136 139  K 5.6* 5.3* 5.3* 5.0  CL 104  --  109 112*  CO2 22  --  19* 21*  GLUCOSE 165*  --  200* 158*  BUN 98*  --  96* 88*  CREATININE 2.76*  --  2.29* 2.17*  CALCIUM 10.0  --  9.4 9.6   Liver Function Tests: No results for input(s): AST, ALT, ALKPHOS, BILITOT, PROT, ALBUMIN in the last 168 hours. No results for input(s): LIPASE, AMYLASE in the last 168 hours. No results for input(s): AMMONIA in the last 168 hours. CBC:  Recent Labs Lab 08/16/15 1030 08/16/15 2220 08/17/15 0224 08/17/15 1250 08/18/15 0200  WBC 14.4* 13.4* 12.3* 15.3* 14.2*  NEUTROABS 12.1*  --   --   --   --   HGB 5.2* 7.0* 6.8* 8.0* 7.4*  HCT 16.8* 21.6* 21.4* 25.0* 23.8*  MCV 99.4 95.6 94.7 91.6 91.9  PLT 175 126* 131* 124* 127*   Cardiac Enzymes: No results for input(s): CKTOTAL, CKMB, CKMBINDEX, TROPONINI in the last 168 hours. BNP (last 3 results)  Recent Labs  12/17/14 1612  PROBNP 447.0*   CBG:  Recent Labs Lab 08/16/15 1540  GLUCAP 121*    Recent Results (from the past 240 hour(s))  MRSA PCR Screening     Status: None   Collection Time: 08/16/15  7:19 PM  Result Value Ref Range Status   MRSA by PCR NEGATIVE NEGATIVE Final    Comment:        The GeneXpert MRSA Assay (FDA approved for NASAL specimens only), is one component of a comprehensive MRSA colonization surveillance program. It is not intended to diagnose  MRSA infection nor to guide or monitor treatment for MRSA infections.          Studies: No results found.      Scheduled Meds: . antiseptic oral rinse  7 mL Mouth Rinse BID  . [START ON 08/23/2015] darbepoetin (ARANESP) injection - NON-DIALYSIS  40 mcg Subcutaneous Q Tue-1800  . pantoprazole  40 mg Oral BID  . sodium chloride  3 mL Intravenous Q12H   Continuous Infusions:   Principal Problem:   Symptomatic anemia Active Problems:   Secondary pulmonary hypertension (HCC)   Rheumatoid arthritis (HCC)   OSA on CPAP   Chronic diastolic heart failure (HCC)   Chronic respiratory failure with hypoxia (HCC)   Mitral regurgitation   Normocytic anemia   CKD (chronic kidney disease) stage 3, GFR 30-59 ml/min   HTN (hypertension)   Leukocytosis   Acute hyperkalemia   Myelodysplastic syndrome (HCC)   Anemia    Time spent: 45 minutes.    Vernell Leep, MD, FACP, FHM. [facility] Pager [phone]  If 7PM-7AM, please contact night-coverage [email or web address] Password [email or web address] 08/18/2015, 12:45 PM    LOS: 2 days

## 2015-08-18 NOTE — Progress Notes (Signed)
Daily Rounding Note  08/18/2015, 8:23 AM  LOS: 2 days   SUBJECTIVE:       Some confusion this AM as another GI MD ordered colonoscopy on this pt and she was kept npo this AM.  Did not get the prep.   Feeling a bit down and slept poorly. Stools x 2 yesterday were black.   OBJECTIVE:         Vital signs in last 24 hours:    Temp:  [97.7 F (36.5 C)-98.8 F (37.1 C)] 98 F (36.7 C) (01/12 0400) Pulse Rate:  [54-61] 57 (01/12 0513) Resp:  [18-26] 18 (01/12 0513) BP: (92-120)/(48-78) 109/64 mmHg (01/12 0513) SpO2:  [100 %] 100 % (01/12 0000) Last BM Date: 08/17/15 Filed Weights   08/16/15 1010 08/16/15 1832  Weight: 96.163 kg (212 lb) 95.7 kg (210 lb 15.7 oz)   General: pleasant, not acutely ill looking Heart: RRR Chest: clear bil. Nasal CPAP in place Abdomen: soft, NT, ND, obese, active BS  Extremities: no CCE Neuro/Psych:  Oriented x 3.    Intake/Output from previous day: 01/11 0701 - 01/12 0700 In: 335 [Blood:335] Out: 1050 [Urine:1050]  Intake/Output this shift:    Lab Results:  Recent Labs  08/17/15 0224 08/17/15 1250 08/18/15 0200  WBC 12.3* 15.3* 14.2*  HGB 6.8* 8.0* 7.4*  HCT 21.4* 25.0* 23.8*  PLT 131* 124* 127*   BMET  Recent Labs  08/16/15 1030 08/16/15 1224 08/17/15 0224 08/18/15 0200  NA 135  --  136 139  K 5.6* 5.3* 5.3* 5.0  CL 104  --  109 112*  CO2 22  --  19* 21*  GLUCOSE 165*  --  200* 158*  BUN 98*  --  96* 88*  CREATININE 2.76*  --  2.29* 2.17*  CALCIUM 10.0  --  9.4 9.6   LFT No results for input(s): PROT, ALBUMIN, AST, ALT, ALKPHOS, BILITOT, BILIDIR, IBILI in the last 72 hours. PT/INR No results for input(s): LABPROT, INR in the last 72 hours. Hepatitis Panel No results for input(s): HEPBSAG, HCVAB, HEPAIGM, HEPBIGM in the last 72 hours.  Studies/Results: Dg Chest 2 View  08/16/2015  CLINICAL DATA:  Weakness, shortness of breath for 5 days EXAM: CHEST  2 VIEW  COMPARISON:  12/17/2014 FINDINGS: Cardiomediastinal silhouette is stable. Again noted dilated aortic arch and descending aorta. Status post median sternotomy. Stable right perihilar scarring. Central mild vascular congestion without convincing pulmonary edema. Mild left basilar atelectasis. Mild degenerative changes thoracic spine. No definite superimposed infiltrate or pulmonary edema. IMPRESSION: Cardiomegaly again noted. 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 Status post median sternotomy. Stable scarring right midlung. Central mild vascular congestion without pulmonary edema. Mild left basilar atelectasis. No definite superimposed infiltrate. Electronically Signed   By: Lahoma Crocker M.D.   On: 08/16/2015 11:38   Scheduled Meds: . antiseptic oral rinse  7 mL Mouth Rinse BID  . pantoprazole  40 mg Oral BID  . sodium chloride  3 mL Intravenous Q12H   Continuous Infusions:  PRN Meds:.acetaminophen **OR** acetaminophen, ALPRAZolam, ondansetron **OR** ondansetron (ZOFRAN) IV   ASSESMENT:   * Acute on chronic anemia. PO iron for many years. Parenteral iron x 2 04/2015. Aranesp began12/2016. Significant decline in Hgb despite these measures. FOBT + stool. Currently with very dark stools but patient says they are chronically dark. Empiric PPI day 3 (none PTA), BID for now.  Stopped oral iron temporarily to avoid confusion  as to dark stools.   * Severe mitral valve regurge, not a operative candidate.  * Pulmonary hypertension. On chronic oxygen. Obstructive sleep apnea on CPAP. Due to cardiopulmonary issues she is high risk for sedation. Awaiting cardiac risk assessment for sedation/EGD.   * Stage 3 CKD    PLAN   *  Await cardiac assessment as to sedation/procedure risk. I called the consult.   *  HH diet.    *  Fill out safety zone portal for mistaken orders for procedure.    Azucena Freed  08/18/2015, 8:23 AM Pager: [phone]

## 2015-08-18 NOTE — Progress Notes (Addendum)
MEDICATION RELATED CONSULT NOTE - FOLLOW UP  Pharmacy Consult:  Aranesp Indication: Anemia of CKD  No Known Allergies  Patient Measurements: Height: 5' 4.5" (163.8 cm) Weight: 210 lb 15.7 oz (95.7 kg) IBW/kg (Calculated) : 55.85  Vital Signs: Temp: 98.5 F (36.9 C) (01/12 1213) Temp Source: Oral (01/12 1213) BP: 99/54 mmHg (01/12 1213) Pulse Rate: 51 (01/12 1213) Intake/Output from previous day: 01/11 0701 - 01/12 0700 In: 335 [Blood:335] Out: 1050 [Urine:1050]  Labs:  Recent Labs  08/16/15 1030  08/17/15 0224 08/17/15 1250 08/18/15 0200  WBC 14.4*  < > 12.3* 15.3* 14.2*  HGB 5.2*  < > 6.8* 8.0* 7.4*  HCT 16.8*  < > 21.4* 25.0* 23.8*  PLT 175  < > 131* 124* 127*  CREATININE 2.76*  --  2.29*  --  2.17*  < > = values in this interval not displayed. Estimated Creatinine Clearance: 28.5 mL/min (by C-G formula based on Cr of 2.17).   Microbiology: Recent Results (from the past 720 hour(s))  MRSA PCR Screening     Status: None   Collection Time: 08/16/15  7:19 PM  Result Value Ref Range Status   MRSA by PCR NEGATIVE NEGATIVE Final    Comment:        The GeneXpert MRSA Assay (FDA approved for NASAL specimens only), is one component of a comprehensive MRSA colonization surveillance program. It is not intended to diagnose MRSA infection nor to guide or monitor treatment for MRSA infections.       Assessment: 87 YOF presented on 08/16/15 with generalized weakness, fatigue and dypsnea.  Pharmacy managing Aranesp for anemia (IDA, CKD, MDS).  Patient received the first dose of Aranesp on 08/16/15 and several transfusions since.  Today's hemoglobin remains low at 7.4 g/dL.  PO iron from PTA held to assist with interpretation of dark stools.  FOB positive on 08/17/15.   Goal of Therapy:  Hgb >/= 11 g/dL   Plan:  - Continue Aranesp 70mcg SQ q-Tues for up to 4 doses - Monitor hgb and d/c Aranesp if hgb >/= 11 g/dL - F/U iron supplementation    Brody Bonneau D. Mina Marble,  PharmD, BCPS Pager:  [phone] 08/18/2015, 12:32 PM    ==============================   Addendum: - start IV iron   Plan: - Iron dextran 25mg  test dose, then 500mg  IV Q24H x 2 doses. - Pharmacy will sign off and follow peripherally.  Thank you for the consult!   Tahirah Sara D. Mina Marble, PharmD, BCPS Pager:  [phone] 08/18/2015, 1:36 PM

## 2015-08-19 ENCOUNTER — Inpatient Hospital Stay (HOSPITAL_COMMUNITY): Payer: Medicare Other | Admitting: Anesthesiology

## 2015-08-19 ENCOUNTER — Encounter (HOSPITAL_COMMUNITY): Payer: Self-pay

## 2015-08-19 ENCOUNTER — Encounter (HOSPITAL_COMMUNITY)
Admission: EM | Disposition: A | Discharge status: Home / Self Care | Payer: Self-pay | Source: Home / Self Care | Attending: Internal Medicine

## 2015-08-19 DIAGNOSIS — D122 Benign neoplasm of ascending colon: Secondary | ICD-10-CM | POA: Insufficient documentation

## 2015-08-19 DIAGNOSIS — Z86010 Personal history of colonic polyps: Secondary | ICD-10-CM

## 2015-08-19 DIAGNOSIS — D120 Benign neoplasm of cecum: Secondary | ICD-10-CM

## 2015-08-19 HISTORY — PX: ESOPHAGOGASTRODUODENOSCOPY: SHX5428

## 2015-08-19 HISTORY — PX: COLONOSCOPY: SHX5424

## 2015-08-19 LAB — BASIC METABOLIC PANEL
Anion gap: 7 (ref 5–15)
BUN: 72 mg/dL — ABNORMAL HIGH (ref 6–20)
CHLORIDE: 112 mmol/L — AB (ref 101–111)
CO2: 23 mmol/L (ref 22–32)
CREATININE: 2.06 mg/dL — AB (ref 0.44–1.00)
Calcium: 9.6 mg/dL (ref 8.9–10.3)
GFR calc non Af Amer: 24 mL/min — ABNORMAL LOW (ref >60)
GFR, EST AFRICAN AMERICAN: 28 mL/min — AB (ref >60)
Glucose, Bld: 110 mg/dL — ABNORMAL HIGH (ref 65–99)
POTASSIUM: 4.8 mmol/L (ref 3.5–5.1)
Sodium: 142 mmol/L (ref 135–145)

## 2015-08-19 LAB — CBC
HEMATOCRIT: 24.9 % — AB (ref 36.0–46.0)
HEMOGLOBIN: 7.7 g/dL — AB (ref 12.0–15.0)
MCH: 29.3 pg (ref 26.0–34.0)
MCHC: 30.9 g/dL (ref 30.0–36.0)
MCV: 94.7 fL (ref 78.0–100.0)
Platelets: 134 10*3/uL — ABNORMAL LOW (ref 150–400)
RBC: 2.63 MIL/uL — AB (ref 3.87–5.11)
RDW: 19.3 % — ABNORMAL HIGH (ref 11.5–15.5)
WBC: 12.4 10*3/uL — ABNORMAL HIGH (ref 4.0–10.5)

## 2015-08-19 SURGERY — COLONOSCOPY WITH PROPOFOL
Anesthesia: Monitor Anesthesia Care

## 2015-08-19 SURGERY — EGD (ESOPHAGOGASTRODUODENOSCOPY)
Anesthesia: Monitor Anesthesia Care

## 2015-08-19 MED ORDER — SODIUM CHLORIDE 0.9 % IV SOLN
INTRAVENOUS | Status: DC
  Administered 2015-08-19: 15:00:00 via INTRAVENOUS

## 2015-08-19 MED ORDER — BUTAMBEN-TETRACAINE-BENZOCAINE 2-2-14 % EX AERO
INHALATION_SPRAY | CUTANEOUS | Status: DC | PRN
  Administered 2015-08-19: 2 via TOPICAL

## 2015-08-19 MED ORDER — PROPOFOL 500 MG/50ML IV EMUL
INTRAVENOUS | Status: DC | PRN
  Administered 2015-08-19: 100 ug/kg/min via INTRAVENOUS

## 2015-08-19 MED ORDER — PROPOFOL 10 MG/ML IV BOLUS
INTRAVENOUS | Status: DC | PRN
  Administered 2015-08-19: 20 mg via INTRAVENOUS
  Administered 2015-08-19: 10 mg via INTRAVENOUS

## 2015-08-19 MED ORDER — FUROSEMIDE 40 MG PO TABS: 40.0000 mg | ORAL_TABLET | Freq: Two times a day (BID) | ORAL | Status: DC

## 2015-08-19 MED ORDER — FAMOTIDINE 20 MG PO TABS
20.0000 mg | ORAL_TABLET | Freq: Two times a day (BID) | ORAL | Status: DC | PRN
  Filled 2015-08-19: qty 1

## 2015-08-19 MED ORDER — LIDOCAINE HCL (CARDIAC) 20 MG/ML IV SOLN
INTRAVENOUS | Status: DC | PRN
  Administered 2015-08-19: 50 mg via INTRATRACHEAL

## 2015-08-19 MED ORDER — FUROSEMIDE 80 MG PO TABS
80.0000 mg | ORAL_TABLET | Freq: Every day | ORAL | Status: DC
  Administered 2015-08-19: 80 mg via ORAL
  Filled 2015-08-19: qty 1

## 2015-08-19 NOTE — Op Note (Signed)
[facility] [place], [id number]   ENDOSCOPY PROCEDURE REPORT  PATIENT: April, Mueller  MR#: [id number] BIRTHDATE: 1946-07-29 , 30  yrs. old GENDER: female ENDOSCOPIST: Jerene Bears, MD REFERRED BY:  [facility] PROCEDURE DATE:  08/19/2015 PROCEDURE:  EGD, diagnostic ASA CLASS:     Class III INDICATIONS:  iron deficiency anemia and heme positive stool. MEDICATIONS: Per Anesthesia and Monitored anesthesia care TOPICAL ANESTHETIC: Cetacaine Spray  DESCRIPTION OF PROCEDURE: After the risks benefits and alternatives of the procedure were thoroughly explained, informed consent was obtained.  The Pentax Gastroscope [id number] endoscope was introduced through the mouth and advanced to the second portion of the duodenum , Without limitations.  The instrument was slowly withdrawn as the mucosa was fully examined.      EXAM: The esophagus and gastroesophageal junction were completely normal in appearance.  The stomach was entered and closely examined.The antrum, angularis, and lesser curvature were well visualized, including a retroflexed view of the cardia and fundus. The stomach wall was normally distensible.  The scope passed easily through the pylorus into the duodenum.  Retroflexed views revealed no abnormalities.     The scope was then withdrawn from the patient and the procedure completed.  COMPLICATIONS: There were no immediate complications.  ENDOSCOPIC IMPRESSION: Normal EGD  RECOMMENDATIONS: Proceed with a Colonoscopy.  eSigned:  Jerene Bears, MD 08/19/2015 4:16 PM    CC: the patient

## 2015-08-19 NOTE — Transfer of Care (Signed)
Immediate Anesthesia Transfer of Care Note  Patient: ICESS LEATON  Procedure(s) Performed: Procedure(s) with comments: ESOPHAGOGASTRODUODENOSCOPY (EGD) (N/A) - patient scheduled, anesthesia aware of 1500 case, per Amberrose Friebel.  08/18/15 DP COLONOSCOPY (N/A)  Patient Location: Endoscopy Unit  Anesthesia Type:MAC  Level of Consciousness: awake, alert , oriented and patient cooperative  Airway & Oxygen Therapy: Patient Spontanous Breathing and Patient connected to nasal cannula oxygen  Post-op Assessment: Report given to RN and Post -op Vital signs reviewed and stable  Post vital signs: Reviewed and stable  Last Vitals:  Filed Vitals:   08/19/15 1314 08/19/15 1456  BP: 83/50 92/55  Pulse: 79 73  Temp: 36.8 C 36.9 C  Resp: 25 18    Complications: No apparent anesthesia complications

## 2015-08-19 NOTE — Progress Notes (Signed)
To Endoscopy via w/c. Pt noted with ectopic beats on the monitor and were shown to Dr. Sung Amabile prior to leaving for procedure.

## 2015-08-19 NOTE — Progress Notes (Signed)
Patient ID: April Mueller, female   DOB: 1946-07-30, 70 y.o.   MRN: [id number]    April Mueller is a 70 yo with COPD, former smoker (28 pk-yrs), obesity, RA , CKD 4, Type A aortic dissection s/p repair 2005 c/b renal failure requiring HD x 3 months, PH - previously treated in [place]. Admitted with profound iron deficiency anemia.   Last echo 08/17/15. LVEF 55-60% D-shaped septum with RV strain. Moderate MR. RVSP 66. Has chronic NYHA III symptoms. Pulmonary hypertension felt to be combination WHO Group II and III. Recently stopped macitentan.   [place] 08/18/2014: RA = 14 RV = 62/3/15 PA = 63/21 (38) PCW = mean 23 v waves to 40 Fick cardiac output/index = 6.3/3.2 Thermodilution CO/CI = 6.1/3.1, PVR = 2.0 WU, FA sat = 91% PA sat = 57%, 62%  On admit Hgb 5.2 and marked exertional dyspnea. Received 3 UPRBCs to date.  Has had black stools at home but attributes to her iron. No BRBPR or frank melena. Feels better. GI consulted with plans for EGD/Colonoscopy this afternoon. Denies CP or SOB but has not been walking much.  Hemoglobin up to 7.7 today.   Scheduled Meds: . antiseptic oral rinse  7 mL Mouth Rinse BID  . atorvastatin  10 mg Oral Daily  . [START ON 08/23/2015] darbepoetin (ARANESP) injection - NON-DIALYSIS  40 mcg Subcutaneous Q Tue-1800  . furosemide  80 mg Oral Daily  . iron dextran (INFED/DEXFERRUM) infusion  500 mg Intravenous Q24 Hr x 2  . pantoprazole  40 mg Oral BID  . sodium chloride  3 mL Intravenous Q12H   Continuous Infusions:  PRN Meds:.acetaminophen **OR** [DISCONTINUED] acetaminophen, ALPRAZolam, ondansetron **OR** [DISCONTINUED] ondansetron (ZOFRAN) IV   Filed Vitals:   08/19/15 0500 08/19/15 0600 08/19/15 0628 08/19/15 0636  BP: 89/56 73/52 83/52  113/53  Pulse: 63 67 69 74  Temp:      TempSrc:      Resp: 19 22 17    Height:      Weight:      SpO2:        Intake/Output Summary (Last 24 hours) at 08/19/15 0804 Last data filed at 08/18/15 2200  Gross per 24 hour    Intake    250 ml  Output    950 ml  Net   -700 ml    LABS: Basic Metabolic Panel:  Recent Labs  08/18/15 0200 08/19/15 0310  NA 139 142  K 5.0 4.8  CL 112* 112*  CO2 21* 23  GLUCOSE 158* 110*  BUN 88* 72*  CREATININE 2.17* 2.06*  CALCIUM 9.6 9.6   Liver Function Tests: No results for input(s): AST, ALT, ALKPHOS, BILITOT, PROT, ALBUMIN in the last 72 hours. No results for input(s): LIPASE, AMYLASE in the last 72 hours. CBC:  Recent Labs  08/16/15 1030  08/18/15 1700 08/19/15 0310  WBC 14.4*  < > 15.7* 12.4*  NEUTROABS 12.1*  --   --   --   HGB 5.2*  < > 7.8* 7.7*  HCT 16.8*  < > 23.8* 24.9*  MCV 99.4  < > 91.9 94.7  PLT 175  < > 122* 134*  < > = values in this interval not displayed. Cardiac Enzymes: No results for input(s): CKTOTAL, CKMB, CKMBINDEX, TROPONINI in the last 72 hours. BNP: Invalid input(s): POCBNP D-Dimer: No results for input(s): DDIMER in the last 72 hours. Hemoglobin A1C: No results for input(s): HGBA1C in the last 72 hours. Fasting Lipid Panel: No results for input(s):  CHOL, HDL, LDLCALC, TRIG, CHOLHDL, LDLDIRECT in the last 72 hours. Thyroid Function Tests: No results for input(s): TSH, T4TOTAL, T3FREE, THYROIDAB in the last 72 hours.  Invalid input(s): FREET3 Anemia Panel:  Recent Labs  08/16/15 1258  VITAMINB12 539  FOLATE 42.0  FERRITIN 17  TIBC 311  IRON 13*  RETICCTPCT 8.1*    RADIOLOGY: Dg Chest 2 View  08/16/2015  CLINICAL DATA:  Weakness, shortness of breath for 5 days EXAM: CHEST  2 VIEW COMPARISON:  12/17/2014 FINDINGS: Cardiomediastinal silhouette is stable. Again noted dilated aortic arch and descending aorta. Status post median sternotomy. Stable right perihilar scarring. Central mild vascular congestion without convincing pulmonary edema. Mild left basilar atelectasis. Mild degenerative changes thoracic spine. No definite superimposed infiltrate or pulmonary edema. IMPRESSION: Cardiomegaly again noted. 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 Status post median sternotomy. Stable scarring right midlung. Central mild vascular congestion without pulmonary edema. Mild left basilar atelectasis. No definite superimposed infiltrate. Electronically Signed   By: Lahoma Crocker M.D.   On: 08/16/2015 11:38    PHYSICAL EXAM General: NAD Neck: JVP 8-9 cm, no thyromegaly or thyroid nodule.  Lungs: Mildly reduced breath sounds bilaterally. CV: Nondisplaced PMI.  Heart regular S1/S2, no S3/S4, 2/6 HSM apex.  No peripheral edema.   Abdomen: Soft, nontender, no hepatosplenomegaly, no distention.  Neurologic: Alert and oriented x 3.  Psych: Normal affect. Extremities: No clubbing or cyanosis.   TELEMETRY: Reviewed telemetry pt in NSR  ASSESSMENT AND PLAN: April Mueller is a 70 yo with COPD, former smoker (28 pk-yrs), obesity, RA , CKD 4, Type A aortic dissection s/p repair 2005 c/b renal failure requiring HD x 3 months, PH - previously treated in [place]. Admitted with profound iron deficiency anemia.  1. 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 This appeared to be primarily pulmonary venous hypertension. Interestingly, there were very prominent V waves in the PCWP tracing. On exam, she has a prominent MR murmur. I was concerned that significant MR may be playing a major role in her symptomatology. I did a TEE in 6/16 showing very eccentric, posteriorly-directed mitral regurgitation possibly from subtle anterior leaflet prolapse. I think that the MR could be severe but is difficult to fully visualize. Her pulmonologist does not think that lung parenchymal disease adequately explains her degree of hypoxemia and dyspnea. She was seen at [facility] for consideration of percutaneous MV clip and was not thought to be a good candidate => concern that MV disease may not actually be  severe and concern that her dyspnea is coming from something else and would not be improved by MV clipping. On exam today, she looks somewhat volume overloaded.  She has been off her Lasix since admission and has gotten 3 units PRBCs. - Restart home Lasix, she was taking 80 mg po daily.   - She is currently off amlodipine, Toprol XL, and spironolactone with soft BP.  2. Chronic respiratory failure: She is on 4-5 L home oxygen by [place]. I have been concerned that she does have significant underlying lung parenchymal disease. However, her pulmonologist (Dr Melvyn Novas), did not think she had a significant degree of obstructive lung disease on last PFTs done in the last year. He thought that the restriction was primarily due to her body habitus.  - Continue supplemental oxygen.  3. OSA: Needs CPAP nightly.  4. CKD: Stage IV. Creatinine initially  elevated, now back to her baseline at 2.06.  5. COPD: See #2 above.  6. Aortic dissection s/p repair 2005: Although complete records are not available, it appears that the patient developed a Type A dissection in 2005 with ascending aorta repair (can see the surgically repaired ascending aorta well on TEE). She has residual dissection in the descending thoracic aorta.  7. Pulmonary hypertension: I think that this is primarily WHO group 2 (pulmonary venous hypertension) and group 3 (PH due to intrinsic lung disease) rather than primarily WHO group 1 PAH. RHC 1/16 showed low PVR. She is now off macitentan has felt better off it. She may need eventual RHC. 8. Mitral regurgitation: Prior TEE concerning for possibly severe, eccentric mitral regurgitation, most likely due to subtle prolapse of the anterior mitral valve leaflet. I have been concerned that the MR is playing a role in her CHF. She had prominent V-waves on RHC 1/16, and she has a mitral area murmur. I do not think that she is a good candidate for open MV surgery. I had her evaluated at [facility] for  possible percutaneous mitral clipping. She was turned down for this as her MR was not clearly severe and fixing it may not help her hypoxemia appreciably. Echo this admission appears to show moderate MR.  9. Anemia: Profound anemia at admission, suspect GI blood loss, probably upper with black stool and elevated BUN.  Hemoglobin increased with transfusions.  - Plan for EGD and colonoscopy today per GI.  Given degree of anemia, I agree that it is important to undergo endoscopy. Given her chronic lung disease and CHF, I think she is at least moderate but not prohibitive risk for EGD and colonoscopy with conscious sedation.  She was on ASA at home, will need to stop it.   [name] 08/19/2015 8:17 AM

## 2015-08-19 NOTE — Anesthesia Postprocedure Evaluation (Signed)
Anesthesia Post Note  Patient: April Mueller  Procedure(s) Performed: Procedure(s) (LRB): ESOPHAGOGASTRODUODENOSCOPY (EGD) (N/A) COLONOSCOPY (N/A)  Patient location during evaluation: PACU Anesthesia Type: MAC Level of consciousness: awake Pain management: pain level controlled Vital Signs Assessment: post-procedure vital signs reviewed and stable Respiratory status: spontaneous breathing Cardiovascular status: stable Postop Assessment: no signs of nausea or vomiting Anesthetic complications: no    Last Vitals:  Filed Vitals:   08/19/15 1656 08/19/15 1705  BP: 98/58 122/64  Pulse: 81 73  Temp:    Resp: 23 19    Last Pain:  Filed Vitals:   08/19/15 1705  PainSc: 0-No pain                 Sydnee Lamour JR,JOHN [place]

## 2015-08-19 NOTE — Progress Notes (Signed)
Tap Water enema completed with clear results, Henrietta Dine notified.

## 2015-08-19 NOTE — Op Note (Signed)
[facility] [place], [id number]   COLONOSCOPY PROCEDURE REPORT  PATIENT: Moe, Rohlfs  MR#: [id number] BIRTHDATE: [birth year] , 69  yrs. old GENDER: female ENDOSCOPIST: Jerene Bears, MD REFERRED [id number] Hospitalist PROCEDURE DATE:  08/19/2015 PROCEDURE:   Colonoscopy, diagnostic and Colonoscopy with snare polypectomy First Screening Colonoscopy - Avg.  risk and is 50 yrs.  old or older - No.  Prior Negative Screening - Now for repeat screening. N/A  History of Adenoma - Now for follow-up colonoscopy & has been > or = to 3 yrs.  N/A History of Adenoma - Now for follow-up colonoscopy & has been > or = to 3 yrs.  Polyps removed today? Yes ASA CLASS:   Class III INDICATIONS:anemia, heme positive stools, history of colon polyps, unremarkable EGD. MEDICATIONS: Monitored anesthesia care and Per Anesthesia  DESCRIPTION OF PROCEDURE:   After the risks benefits and alternatives of the procedure were thoroughly explained, informed consent was obtained.  The digital rectal exam revealed several skin tags.   The Pentax Ped Colon [id number]  endoscope was introduced through the anus and advanced to the cecum, which was identified by both the appendix and ileocecal valve. No adverse events experienced.   The quality of the prep was good.  (Colyte was used) The instrument was then slowly withdrawn as the colon was fully examined. Estimated blood loss is zero unless otherwise noted in this procedure report.    COLON FINDINGS: Two sessile polyps measuring 3 and 10 mm mm in size were found at the cecum.  Polypectomies were performed with cold snare.  The resection was complete, the polyp tissue was completely retrieved and sent to histology.  Oozing at the site of the larger polypectomy was controlled using hemoclip x 1. Three sessile polyps ranging from 5 to 108mm in size were found in the ascending colon. Polypectomies were performed with a cold  snare.  The resection was complete, the polyp tissue was completely retrieved and sent to histology.   There was severe diverticulosis noted in the sigmoid colon with associated muscular hypertrophy.  Retroflexed views revealed moderate-sized internal hemorrhoids. The time to cecum = 5.9 Withdrawal time = 19.5   The scope was withdrawn and the procedure completed.  COMPLICATIONS: There were no immediate complications.  ENDOSCOPIC IMPRESSION: 1.   Two sessile polyps measuring 3 and 10 mm mm in size were found at the cecum; polypectomies were performed with cold snare; hemoclip placement x 1 at site of larger polypectomy 2.   Three sessile polyps ranging from 5 to 12mm in size were found in the ascending colon; polypectomies were performed with a cold snare 3.   There was severe diverticulosis noted in the sigmoid colon  RECOMMENDATIONS: 1.  Await pathology results 2.  High fiber diet 3.  Follow-up with hematology as an outpatient.  Monitor Hgb and iron studies.  If anemia progresses or further evidence of GI bleeding, then would recommend referral back to GI for video capsule endoscopy.  eSigned:  Jerene Bears, MD 08/19/2015 4:58 PM   cc:  the patient   PATIENT NAME:  Shalece, Herrera MR#: [id number]

## 2015-08-19 NOTE — Anesthesia Preprocedure Evaluation (Signed)
Anesthesia Evaluation  Patient identified by MRN, date of birth, ID band Patient awake    Reviewed: Allergy & Precautions, H&P , NPO status , Patient's Chart, lab work & pertinent test results, reviewed documented beta blocker date and time   Airway Mallampati: II  TM Distance: >3 FB Neck ROM: full    Dental no notable dental hx.    Pulmonary neg pulmonary ROS, former smoker,    Pulmonary exam normal        Cardiovascular Exercise Tolerance: Good hypertension, Pt. on medications negative cardio ROS Normal cardiovascular exam  08/17/15 echo with LVEF of 55%   Neuro/Psych negative neurological ROS  negative psych ROS   GI/Hepatic negative GI ROS, Neg liver ROS,   Endo/Other  negative endocrine ROS  Renal/GU   negative genitourinary   Musculoskeletal   Abdominal Normal abdominal exam  (+)   Peds  Hematology   Anesthesia Other Findings   Reproductive/Obstetrics negative OB ROS                             Anesthesia Physical Anesthesia Plan  ASA: II  Anesthesia Plan: MAC   Post-op Pain Management:    Induction:   Airway Management Planned:   Additional Equipment:   Intra-op Plan:   Post-operative Plan:   Informed Consent: I have reviewed the patients History and Physical, chart, labs and discussed the procedure including the risks, benefits and alternatives for the proposed anesthesia with the patient or authorized representative who has indicated his/her understanding and acceptance.     Plan Discussed with: CRNA and Surgeon  Anesthesia Plan Comments:         Anesthesia Quick Evaluation

## 2015-08-19 NOTE — Progress Notes (Signed)
PROGRESS NOTE    April Mueller [id number] DOB: 1946-03-17 DOA: 08/16/2015 PCP:  Melinda Crutch, MD  Hematology: Dr. Sullivan Lone. Cardiology: Dr. [name] Rheumatology: Dr. Gavin Pound Pulmonology: Dr. Baltazar Apo  HPI/Brief narrative 70 year old female patient with history of COPD, obesity, rheumatoid arthritis on Plaquenil, type A aortic dissection status post repair 2005, PAH, OSA on nightly CPAP, chronic diastolic CHF, chronic kidney disease IV, severe MR-not a good candidate for MV clip, anemia related to CKD/RA/MDS, chronic respiratory failure on home oxygen admitted to [facility] on 08/16/15 with symptomatic anemia and hemoglobin of 5.2. She has chronic black stools attributed to iron supplements. Patient was transfused 3 units of PRBCs. Baseline hemoglobin set to be in the 9-10 grams per DL range. [facility] was consulted and are awaiting on preprocedure cardiac clearance (options of EGD, colonoscopy or capsule study). Have placed a call for patient's primary hematologist as well.   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 Acute anemia may have been due to GI blood loss. - Follows with Dr. Irene Limbo, Hematology-have left a voicemail message 1/12 to discuss. Patient was on iron infusions and Epogen. - Status post 3 units PRBC since admission. Hemoglobin improved from 5.2 to 8 but has dropped down again this morning to 7.4. Monitor closely and transfuse if hemoglobin <7 g per DL. - Anemia panel: Iron 13, TIBC 311, saturation ratio 4, ferritin 17, folate 42, B12: 539. Will provide a dose of IV iron per pharmacy. [facility] input appreciated and contemplating evaluation pending cardiology clearance. - Hemoglobin stable over the last 72 hours.  FOBT +/possible GI bleed - [facility] input appreciated. - Evaluation in August 2016 by [facility]: Patient felt  too high risk for procedures at that time. As per GI, she is certainly high risk for procedure relating to sedation given her cardiopulmonary status.  - Cardiology input and follow-up appreciated. The degree that it is important for her to undergo endoscopy to evaluate anemia and given her chronic lung disease and CHF, they think she is at least at moderate but not prohibitive risk for EGD and colonoscopy under conscious sedation. - Plans for endoscopy 1/13.  Stage IV chronic kidney disease - Creatinine seems to be at baseline.  Chronic diastolic CHF/pulmonary hypertension - Compensated. Lasix and Toprol currently on hold.  Thrombocytopenia - Stable  Essential hypertension - Normal to soft blood pressures. Holding amlodipine and Toprol for now.  Rheumatoid arthritis - Follows with Dr. Gavin Pound as outpatient. Plaquenil held currently.  OSA on CPAP/chronic respiratory failure on home oxygen - Continue CPAP and oxygen supplementation.  Severe MR - Not candidate for surgical intervention.  Hyperkalemia - Resolved but K is at high normal. Follow daily BMP's. Low potassium diet.   DVT prophylaxis: SCDs Code Status: Full Family Communication: None at bedside Disposition Plan: Continue management in stepdown unit. DC home when medically stable.   Consultants:  [facility]  Cardiology  Procedures:  Nightly CPAP  Antimicrobials:  None   Subjective: Seen this morning prior to procedures. Underwent bowel prep overnight. Several watery BMs-dark with some specks of stool. No bright red blood. No other complaints reported. As per RN, no acute events.  Objective: Filed Vitals:   08/19/15 0636 08/19/15 0829 08/19/15 1314 08/19/15 1456  BP: 113/53 92/53 83/50  92/55  Pulse: 74 76 79 73  Temp:  98.3 F (36.8 C) 98.3  F (36.8 C) 98.5 F (36.9 C)  TempSrc:  Oral Oral Oral  Resp:  23 25 18   Height:      Weight:      SpO2:  100% 95% 94%    Intake/Output Summary (Last  24 hours) at 08/19/15 1602 Last data filed at 08/18/15 2200  Gross per 24 hour  Intake    250 ml  Output    950 ml  Net   -700 ml   Filed Weights   08/16/15 1010 08/16/15 1832  Weight: 96.163 kg (212 lb) 95.7 kg (210 lb 15.7 oz)    Exam:  General exam: Pleasant middle-aged female lying comfortably propped up in bed with CPAP onn, this morning. Respiratory system: Clear. No increased work of breathing. Cardiovascular system: S1 & S2 heard, RRR. No JVD, murmurs, gallops, clicks or pedal edema. Telemetry: SB in the 50s-SR. Gastrointestinal system: Abdomen is nondistended, soft and nontender. Normal bowel sounds heard. Central nervous system: Alert and oriented. No focal neurological deficits. Extremities: Symmetric 5 x 5 power.   Data Reviewed: Basic Metabolic Panel:  Recent Labs Lab 08/16/15 1030 08/16/15 1224 08/17/15 0224 08/18/15 0200 08/19/15 0310  NA 135  --  136 139 142  K 5.6* 5.3* 5.3* 5.0 4.8  CL 104  --  109 112* 112*  CO2 22  --  19* 21* 23  GLUCOSE 165*  --  200* 158* 110*  BUN 98*  --  96* 88* 72*  CREATININE 2.76*  --  2.29* 2.17* 2.06*  CALCIUM 10.0  --  9.4 9.6 9.6   Liver Function Tests: No results for input(s): AST, ALT, ALKPHOS, BILITOT, PROT, ALBUMIN in the last 168 hours. No results for input(s): LIPASE, AMYLASE in the last 168 hours. No results for input(s): AMMONIA in the last 168 hours. CBC:  Recent Labs Lab 08/16/15 1030  08/17/15 0224 08/17/15 1250 08/18/15 0200 08/18/15 1700 08/19/15 0310  WBC 14.4*  < > 12.3* 15.3* 14.2* 15.7* 12.4*  NEUTROABS 12.1*  --   --   --   --   --   --   HGB 5.2*  < > 6.8* 8.0* 7.4* 7.8* 7.7*  HCT 16.8*  < > 21.4* 25.0* 23.8* 23.8* 24.9*  MCV 99.4  < > 94.7 91.6 91.9 91.9 94.7  PLT 175  < > 131* 124* 127* 122* 134*  < > = values in this interval not displayed. Cardiac Enzymes: No results for input(s): CKTOTAL, CKMB, CKMBINDEX, TROPONINI in the last 168 hours. BNP (last 3 results)  Recent Labs   12/17/14 1612  PROBNP 447.0*   CBG:  Recent Labs Lab 08/16/15 1540  GLUCAP 121*    Recent Results (from the past 240 hour(s))  MRSA PCR Screening     Status: None   Collection Time: 08/16/15  7:19 PM  Result Value Ref Range Status   MRSA by PCR NEGATIVE NEGATIVE Final    Comment:        The GeneXpert MRSA Assay (FDA approved for NASAL specimens only), is one component of a comprehensive MRSA colonization surveillance program. It is not intended to diagnose MRSA infection nor to guide or monitor treatment for MRSA infections.          Studies: No results found.      Scheduled Meds: . [MAR Hold] antiseptic oral rinse  7 mL Mouth Rinse BID  . [MAR Hold] atorvastatin  10 mg Oral Daily  . [MAR Hold] darbepoetin (ARANESP) injection - NON-DIALYSIS  40 mcg  Subcutaneous Q Tue-1800  . [MAR Hold] furosemide  80 mg Oral Daily  . [MAR Hold] iron dextran (INFED/DEXFERRUM) infusion  500 mg Intravenous Q24 Hr x 2  . [MAR Hold] pantoprazole  40 mg Oral BID  . [MAR Hold] sodium chloride  3 mL Intravenous Q12H   Continuous Infusions: . sodium chloride      Principal Problem:   Symptomatic anemia Active Problems:   Secondary pulmonary hypertension (HCC)   Rheumatoid arthritis (HCC)   OSA on CPAP   Chronic diastolic heart failure (HCC)   Chronic respiratory failure with hypoxia (HCC)   Mitral regurgitation   Normocytic anemia   CKD (chronic kidney disease) stage 3, GFR 30-59 ml/min   HTN (hypertension)   Leukocytosis   Acute hyperkalemia   Myelodysplastic syndrome (HCC)   Anemia   Heme positive stool    Time spent: 15 minutes.    Vernell Leep, MD, FACP, FHM. [facility] Pager [phone]  If 7PM-7AM, please contact night-coverage [email or web address] Password [email or web address] 08/19/2015, 4:02 PM    LOS: 3 days

## 2015-08-20 DIAGNOSIS — I480 Paroxysmal atrial fibrillation: Secondary | ICD-10-CM

## 2015-08-20 DIAGNOSIS — I4819 Other persistent atrial fibrillation: Secondary | ICD-10-CM | POA: Insufficient documentation

## 2015-08-20 DIAGNOSIS — I959 Hypotension, unspecified: Secondary | ICD-10-CM

## 2015-08-20 LAB — CBC
HEMATOCRIT: 23.3 % — AB (ref 36.0–46.0)
Hemoglobin: 7.2 g/dL — ABNORMAL LOW (ref 12.0–15.0)
MCH: 29.9 pg (ref 26.0–34.0)
MCHC: 30.9 g/dL (ref 30.0–36.0)
MCV: 96.7 fL (ref 78.0–100.0)
PLATELETS: 132 10*3/uL — AB (ref 150–400)
RBC: 2.41 MIL/uL — ABNORMAL LOW (ref 3.87–5.11)
RDW: 19.4 % — AB (ref 11.5–15.5)
WBC: 11.5 10*3/uL — AB (ref 4.0–10.5)

## 2015-08-20 LAB — BASIC METABOLIC PANEL
Anion gap: 9 (ref 5–15)
BUN: 51 mg/dL — AB (ref 6–20)
CHLORIDE: 111 mmol/L (ref 101–111)
CO2: 21 mmol/L — ABNORMAL LOW (ref 22–32)
CREATININE: 1.93 mg/dL — AB (ref 0.44–1.00)
Calcium: 9.5 mg/dL (ref 8.9–10.3)
GFR calc Af Amer: 30 mL/min — ABNORMAL LOW (ref >60)
GFR, EST NON AFRICAN AMERICAN: 26 mL/min — AB (ref >60)
GLUCOSE: 128 mg/dL — AB (ref 65–99)
Potassium: 4.3 mmol/L (ref 3.5–5.1)
Sodium: 141 mmol/L (ref 135–145)

## 2015-08-20 LAB — PREPARE RBC (CROSSMATCH)

## 2015-08-20 MED ORDER — AMIODARONE HCL IN DEXTROSE 360-4.14 MG/200ML-% IV SOLN
60.0000 mg/h | INTRAVENOUS | Status: AC
  Administered 2015-08-20: 60 mg/h via INTRAVENOUS
  Filled 2015-08-20: qty 200

## 2015-08-20 MED ORDER — FUROSEMIDE 80 MG PO TABS
80.0000 mg | ORAL_TABLET | Freq: Every day | ORAL | Status: DC
  Administered 2015-08-20: 80 mg via ORAL
  Filled 2015-08-20: qty 1

## 2015-08-20 MED ORDER — SODIUM CHLORIDE 0.9 % IV SOLN: Freq: Once | INTRAVENOUS | Status: DC

## 2015-08-20 MED ORDER — AMIODARONE HCL IN DEXTROSE 360-4.14 MG/200ML-% IV SOLN
30.0000 mg/h | INTRAVENOUS | Status: DC
  Administered 2015-08-20 – 2015-08-23 (×5): 30 mg/h via INTRAVENOUS
  Filled 2015-08-20 (×5): qty 200

## 2015-08-20 MED ORDER — SODIUM CHLORIDE 0.9 % IV BOLUS (SEPSIS)
250.0000 mL | Freq: Once | INTRAVENOUS | Status: AC
  Administered 2015-08-20: 250 mL via INTRAVENOUS

## 2015-08-20 MED ORDER — AMIODARONE IV BOLUS ONLY 150 MG/100ML
150.0000 mg | Freq: Once | INTRAVENOUS | Status: AC
  Administered 2015-08-20: 75 mg via INTRAVENOUS
  Filled 2015-08-20: qty 100

## 2015-08-20 NOTE — Progress Notes (Signed)
Patient ID: April Mueller, female   DOB: 10/05/1945, 70 y.o.   MRN: [id number]    Subjective:  Underwent EGD/Colon 1/13 without source of bleeding. Pending capsule endo tomorrow  Now receiving 2u blood. Diuretics on hold due to low BP and volume depletion.  Developed AF with controlled VR. (asymptomatic).     Scheduled Meds: . sodium chloride   Intravenous Once  . antiseptic oral rinse  7 mL Mouth Rinse BID  . atorvastatin  10 mg Oral Daily  . [START ON 08/23/2015] darbepoetin (ARANESP) injection - NON-DIALYSIS  40 mcg Subcutaneous Q Tue-1800  . iron dextran (INFED/DEXFERRUM) infusion  500 mg Intravenous Q24 Hr x 2  . sodium chloride  3 mL Intravenous Q12H   Continuous Infusions:  PRN Meds:.acetaminophen **OR** [DISCONTINUED] acetaminophen, ALPRAZolam, famotidine, ondansetron **OR** [DISCONTINUED] ondansetron (ZOFRAN) IV   Filed Vitals:   08/20/15 0604 08/20/15 0816 08/20/15 1227 08/20/15 1305  BP: 83/71 81/57 99/52  95/59  Pulse: 66  68 76  Temp:  98.3 F (36.8 C) 98.5 F (36.9 C) 98.7 F (37.1 C)  TempSrc:  Oral Oral Oral  Resp: 20 14 22 26   Height:      Weight:      SpO2:  95% 100% 100%    Intake/Output Summary (Last 24 hours) at 08/20/15 1424 Last data filed at 08/20/15 1150  Gross per 24 hour  Intake    880 ml  Output    900 ml  Net    -20 ml    LABS: Basic Metabolic Panel:  Recent Labs  08/19/15 0310 08/20/15 0300  NA 142 141  K 4.8 4.3  CL 112* 111  CO2 23 21*  GLUCOSE 110* 128*  BUN 72* 51*  CREATININE 2.06* 1.93*  CALCIUM 9.6 9.5   Liver Function Tests: No results for input(s): AST, ALT, ALKPHOS, BILITOT, PROT, ALBUMIN in the last 72 hours. No results for input(s): LIPASE, AMYLASE in the last 72 hours. CBC:  Recent Labs  08/19/15 0310 08/20/15 0300  WBC 12.4* 11.5*  HGB 7.7* 7.2*  HCT 24.9* 23.3*  MCV 94.7 96.7  PLT 134* 132*   Cardiac Enzymes: No results for input(s): CKTOTAL, CKMB, CKMBINDEX, TROPONINI in the last 72  hours. BNP: Invalid input(s): POCBNP D-Dimer: No results for input(s): DDIMER in the last 72 hours. Hemoglobin A1C: No results for input(s): HGBA1C in the last 72 hours. Fasting Lipid Panel: No results for input(s): CHOL, HDL, LDLCALC, TRIG, CHOLHDL, LDLDIRECT in the last 72 hours. Thyroid Function Tests: No results for input(s): TSH, T4TOTAL, T3FREE, THYROIDAB in the last 72 hours.  Invalid input(s): FREET3 Anemia Panel: No results for input(s): VITAMINB12, FOLATE, FERRITIN, TIBC, IRON, RETICCTPCT in the last 72 hours.  RADIOLOGY: Dg Chest 2 View  08/16/2015  CLINICAL DATA:  Weakness, shortness of breath for 5 days EXAM: CHEST  2 VIEW COMPARISON:  12/17/2014 FINDINGS: Cardiomediastinal silhouette is stable. Again noted dilated aortic arch and descending aorta. Status post median sternotomy. Stable right perihilar scarring. Central mild vascular congestion without convincing pulmonary edema. Mild left basilar atelectasis. Mild degenerative changes thoracic spine. No definite superimposed infiltrate or pulmonary edema. IMPRESSION: Cardiomegaly again noted. 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 Status post median sternotomy. Stable scarring right midlung. Central mild vascular congestion without pulmonary edema. Mild left basilar atelectasis. No definite superimposed infiltrate. Electronically Signed   By: Lahoma Crocker M.D.   On: 08/16/2015 11:38    PHYSICAL EXAM General: NAD Neck: JVP flat cm, no thyromegaly  or thyroid nodule.  Lungs: Mildly reduced breath sounds bilaterally. CV: Nondisplaced PMI.  Heart irregular S1/S2, no S3/S4, 2/6 HSM apex.  No peripheral edema.   Abdomen: Soft, nontender, no hepatosplenomegaly, no distention.  Neurologic: Alert and oriented x 3.  Psych: Normal affect. Extremities: No clubbing or cyanosis.   TELEMETRY: Reviewed telemetry pt in NSR  ASSESSMENT AND PLAN: April Mueller is a 70 yo with COPD, former smoker (28 pk-yrs),  obesity, RA , CKD 4, Type A aortic dissection s/p repair 2005 c/b renal failure requiring HD x 3 months, PH - previously treated in [place]. Admitted with profound iron deficiency anemia.  1. 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 This appeared to be primarily pulmonary venous hypertension. Interestingly, there were very prominent V waves in the PCWP tracing. On exam, she has a prominent MR murmur. I was concerned that significant MR may be playing a major role in her symptomatology. I did a TEE in 6/16 showing very eccentric, posteriorly-directed mitral regurgitation possibly from subtle anterior leaflet prolapse. I think that the MR could be severe but is difficult to fully visualize. Her pulmonologist does not think that lung parenchymal disease adequately explains her degree of hypoxemia and dyspnea. She was seen at [facility] for consideration of percutaneous MV clip and was not thought to be a good candidate => concern that MV disease may not actually be severe and concern that her dyspnea is coming from something else and would not be improved by MV clipping. - Volume status low after bowel prep. Holding lasik  - She is currently off amlodipine, Toprol XL, and spironolactone with soft BP.  2. Chronic respiratory failure: She is on 4-5 L home oxygen by [place]. I have been concerned that she does have significant underlying lung parenchymal disease. However, her pulmonologist (Dr April Mueller), did not think she had a significant degree of obstructive lung disease on last PFTs done in the last year. He thought that the restriction was primarily due to her body habitus.  - Continue supplemental oxygen.  3. PAF - This is apparently new. Rate controlled. Not candidate for anticoagulation due to bleeding - Will start IV amio in hoes to convert her within 48 hours.  4. Anemia:  Profound anemia at admission, suspect GI blood loss, probably upper with black stool and elevated BUN.   --EGD/colon on 1/13 without source. Planning capsule endo -Hgb back down to 7.2. Agree with repeat transfusion.  5. OSA: Needs CPAP nightly.  6. Acute on CKD: Stage IV. Creatinine initially elevated, now back to her baseline at 1.9 7. Aortic dissection s/p repair 2005: Although complete records are not available, it appears that the patient developed a Type A dissection in 2005 with ascending aorta repair (can see the surgically repaired ascending aorta well on TEE). She has residual dissection in the descending thoracic aorta.  8. Pulmonary hypertension: I think that this is primarily WHO group 2 (pulmonary venous hypertension) and group 3 (PH due to intrinsic lung disease) rather than primarily WHO group 1 PAH. RHC 1/16 showed low PVR. She is now off macitentan has felt better off it. She may need eventual RHC. 9. Mitral regurgitation: Prior TEE concerning for possibly severe, eccentric mitral regurgitation, most likely due to subtle prolapse of the anterior mitral valve leaflet. I have been concerned that the MR is playing a role in her CHF. She had prominent V-waves on RHC  1/16, and she has a mitral area murmur. I do not think that she is a good candidate for open MV surgery. I had her evaluated at [facility] for possible percutaneous mitral clipping. She was turned down for this as her MR was not clearly severe and fixing it may not help her hypoxemia appreciably. Echo this admission appears to show moderate MR.   Bensimhon, Reform 08/20/2015 2:24 PM

## 2015-08-20 NOTE — Progress Notes (Signed)
  Amiodarone Drug - Drug Interaction Consult Note  Recommendations: No interventions needed Amiodarone is metabolized by the cytochrome P450 system and therefore has the potential to cause many drug interactions. Amiodarone has an average plasma half-life of 50 days (range 20 to 100 days).   There is potential for drug interactions to occur several weeks or months after stopping treatment and the onset of drug interactions may be slow after initiating amiodarone.   []  Statins: Increased risk of myopathy. Simvastatin- restrict dose to 20mg  daily. Other statins: counsel patients to report any muscle pain or weakness immediately.  []  Anticoagulants: Amiodarone can increase anticoagulant effect. Consider warfarin dose reduction. Patients should be monitored closely and the dose of anticoagulant altered accordingly, remembering that amiodarone levels take several weeks to stabilize.  []  Antiepileptics: Amiodarone can increase plasma concentration of phenytoin, the dose should be reduced. Note that small changes in phenytoin dose can result in large changes in levels. Monitor patient and counsel on signs of toxicity.  []  Beta blockers: increased risk of bradycardia, AV block and myocardial depression. Sotalol - avoid concomitant use.  []   Calcium channel blockers (diltiazem and verapamil): increased risk of bradycardia, AV block and myocardial depression.  []   Cyclosporine: Amiodarone increases levels of cyclosporine. Reduced dose of cyclosporine is recommended.  []  Digoxin dose should be halved when amiodarone is started.  []  Diuretics: increased risk of cardiotoxicity if hypokalemia occurs.  []  Oral hypoglycemic agents (glyburide, glipizide, glimepiride): increased risk of hypoglycemia. Patient's glucose levels should be monitored closely when initiating amiodarone therapy.   []  Drugs that prolong the QT interval:  Torsades de pointes risk may be increased with concurrent use - avoid if  possible.  Monitor QTc, also keep magnesium/potassium WNL if concurrent therapy can't be avoided. [name] Antibiotics: e.g. fluoroquinolones, erythromycin. . Antiarrhythmics: e.g. quinidine, procainamide, disopyramide, sotalol. . Antipsychotics: e.g. phenothiazines, haloperidol.  . Lithium, tricyclic antidepressants, and methadone. Thank You,  Leodis Sias T  08/20/2015 2:46 PM

## 2015-08-20 NOTE — Progress Notes (Signed)
Subjective: No complaints.  Objective: Vital signs in last 24 hours: Temp:  [97.4 F (36.3 C)-98.5 F (36.9 C)] 98.3 F (36.8 C) (01/14 0816) Pulse Rate:  [66-84] 66 (01/14 0604) Resp:  [14-25] 14 (01/14 0816) BP: (81-122)/(45-71) 81/57 mmHg (01/14 0816) SpO2:  [94 %-100 %] 95 % (01/14 0816) Weight:  [94 kg (207 lb 3.7 oz)-96.2 kg (212 lb 1.3 oz)] 96.2 kg (212 lb 1.3 oz) (01/14 0500) Last BM Date: 08/20/15  Intake/Output from previous day: 01/13 0701 - 01/14 0700 In: 400 [I.V.:400] Out: 500 [Urine:500] Intake/Output this shift: Total I/O In: 480 [P.O.:480] Out: 400 [Urine:400]  General appearance: alert and no distress GI: soft, non-tender; bowel sounds normal; no masses,  no organomegaly  Lab Results:  Recent Labs  08/18/15 1700 08/19/15 0310 08/20/15 0300  WBC 15.7* 12.4* 11.5*  HGB 7.8* 7.7* 7.2*  HCT 23.8* 24.9* 23.3*  PLT 122* 134* 132*   BMET  Recent Labs  08/18/15 0200 08/19/15 0310 08/20/15 0300  NA 139 142 141  K 5.0 4.8 4.3  CL 112* 112* 111  CO2 21* 23 21*  GLUCOSE 158* 110* 128*  BUN 88* 72* 51*  CREATININE 2.17* 2.06* 1.93*  CALCIUM 9.6 9.6 9.5   LFT No results for input(s): PROT, ALBUMIN, AST, ALT, ALKPHOS, BILITOT, BILIDIR, IBILI in the last 72 hours. PT/INR No results for input(s): LABPROT, INR in the last 72 hours. Hepatitis Panel No results for input(s): HEPBSAG, HCVAB, HEPAIGM, HEPBIGM in the last 72 hours. C-Diff No results for input(s): CDIFFTOX in the last 72 hours. Fecal Lactopherrin No results for input(s): FECLLACTOFRN in the last 72 hours.  Studies/Results: No results found.  Medications:  Scheduled: . sodium chloride   Intravenous Once  . antiseptic oral rinse  7 mL Mouth Rinse BID  . atorvastatin  10 mg Oral Daily  . [START ON 08/23/2015] darbepoetin (ARANESP) injection - NON-DIALYSIS  40 mcg Subcutaneous Q Tue-1800  . iron dextran (INFED/DEXFERRUM) infusion  500 mg Intravenous Q24 Hr x 2  . sodium chloride  3 mL  Intravenous Q12H   Continuous:   Assessment/Plan: 1) Persistent anemia requiring transfusion.  2) Heme positive stool. 3) Renal failure.   I was called by Dr. Algis Liming about the patient and her persistent anemia.  He also spoke with Cardiology and the feeling is that she needs to pursue the capsule endoscopy, which was Dr. Vena Rua recommendation as the EGD and colonoscopy were essentially negative.  Plan: 1) Capsule endoscopy tomorrow.   LOS: 4 days   Arnetta Odeh D 08/20/2015, 11:51 AM

## 2015-08-20 NOTE — Progress Notes (Signed)
PROGRESS NOTE    April Mueller [id number] DOB: [birth year] DOA: 08/16/2015 PCP:  Melinda Crutch, MD  Hematology: Dr. Sullivan Lone. Cardiology: Dr. [name] Rheumatology: Dr. Gavin Pound Pulmonology: Dr. 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 She has chronic black stools attributed to iron supplements. Patient was transfused 3 units of PRBCs. Baseline hemoglobin set to be in the 9-10 grams per DL range. [place] [facility] consulted. Status post EGD (normal) and colonoscopy (multiple polyps-polypectomy) but no obvious source of bleeding.   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 Acute anemia may have been due to GI blood loss. - Follows with Dr. Irene Limbo, Hematology-have left a voicemail message 1/12 to discuss. Patient was on iron infusions and Epogen. - Status post 3 units PRBC since admission. Hemoglobin improved from 5.2 to 8 but has dropped down again this morning to 7.4. Monitor closely and transfuse if hemoglobin <7 g per DL. - Anemia panel: Iron 13, TIBC 311, saturation ratio 4, ferritin 17, folate 42, B12: 539. Will provide a dose of IV iron per pharmacy. [facility] input appreciated and contemplating evaluation pending cardiology clearance. - Hemoglobin in the mid 7 range over the last 3 days. Has reduced slightly to 7.2 g per DL today. Patient symptomatic of dizziness/lightheadedness with activity-may be a combination of hypotension and anemia. We'll  transfuse additional 2 units PRBCs and follow CBC.  FOBT +/possible GI bleed - [place] [facility] input appreciated. - Evaluation in August 2016 by [facility]: Patient felt too high risk for procedures at that time. As per GI, she is certainly high risk for procedure relating to sedation given her cardiopulmonary status.  - Cardiology input and follow-up appreciated. The degree that it is important for her to undergo endoscopy to evaluate anemia and given her chronic lung disease and CHF, they think she is at least at moderate but not prohibitive risk for EGD and colonoscopy under conscious sedation. - EGD and colonoscopy 1/13-results as below. No obvious source of bleeding. Discussed with cardiology-recommend getting capsule study while in house. Discussed with [facility]-will order.  Hypotension - May be related to volume depletion from bowel prep for colonoscopy and anemia. Transfusing 2 units of PRBCs. Toprol and amlodipine on hold. Hold Lasix for today as well. Monitor closely.  Stage IV chronic kidney disease - Creatinine seems to be at baseline.  Chronic diastolic CHF/pulmonary hypertension - Compensated. Lasix and Toprol currently on hold.  Thrombocytopenia - Stable  Essential hypertension - Hypotensive today. Holding amlodipine and Toprol for now.  Rheumatoid arthritis - Follows with Dr. Gavin Pound as outpatient. Plaquenil held currently.  OSA on CPAP/chronic respiratory failure on home oxygen - Continue CPAP and oxygen supplementation.  Severe MR - Not candidate for surgical intervention.  Hyperkalemia - Resolved but K is at high normal. Follow daily BMP's. Low potassium diet.   DVT prophylaxis: SCDs Code Status: Full Family Communication: None at bedside Disposition Plan: Continue management in stepdown unit. DC home when medically stable-possibly 1/15.   Consultants:  [facility]  Cardiology  Procedures:  Nightly CPAP  Colonoscopy 08/19/15:   ENDOSCOPIC  IMPRESSION: 1. Two sessile polyps measuring 3 and 10 mm mm in size were found at the cecum; polypectomies were performed with cold snare; hemoclip placement x 1 at site of larger polypectomy 2. Three sessile polyps ranging from 5 to 18mm in size were found in the ascending colon; polypectomies were performed with a cold snare 3. There was severe diverticulosis noted in the sigmoid colon  RECOMMENDATIONS: 1. Await pathology results 2. High fiber diet 3. Follow-up with hematology as an outpatient. Monitor Hgb and iron studies. If anemia progresses or further evidence of GI bleeding, then would recommend referral back to GI for video capsule endoscopy.   EGD 08/19/15: ENDOSCOPIC IMPRESSION: Normal EGD  RECOMMENDATIONS: Proceed with a Colonoscopy.  Antimicrobials:  None   Subjective: No BM since endoscopies. Feels slightly lightheaded and dizzy in upright position. No dyspnea or chest pain reported.  Objective: Filed Vitals:   08/20/15 0400 08/20/15 0500 08/20/15 0604 08/20/15 0816  BP: 81/55  83/71 81/57  Pulse: 77  66   Temp: 97.8 F (36.6 C)   98.3 F (36.8 C)  TempSrc: Oral   Oral  Resp: 22  20 14   Height:      Weight:  96.2 kg (212 lb 1.3 oz)    SpO2: 98%   95%    Intake/Output Summary (Last 24 hours) at 08/20/15 1105 Last data filed at 08/20/15 0900  Gross per 24 hour  Intake    880 ml  Output    650 ml  Net    230 ml   Filed Weights   08/16/15 1832 08/19/15 1942 08/20/15 0500  Weight: 95.7 kg (210 lb 15.7 oz) 94 kg (207 lb 3.7 oz) 96.2 kg (212 lb 1.3 oz)    Exam:  General exam: Pleasant middle-aged female lying comfortably sitting up in chair this morning. Respiratory system: Clear. No increased work of breathing. Cardiovascular system: S1 & S2 heard, RRR. No JVD, murmurs, gallops, clicks or pedal edema. Telemetry: SR with sinus arrhythmia versus? A. fib versus artifacts. Gastrointestinal system: Abdomen is nondistended, soft and  nontender. Normal bowel sounds heard. Central nervous system: Alert and oriented. No focal neurological deficits. Extremities: Symmetric 5 x 5 power.   Data Reviewed: Basic Metabolic Panel:  Recent Labs Lab 08/16/15 1030 08/16/15 1224 08/17/15 0224 08/18/15 0200 08/19/15 0310 08/20/15 0300  NA 135  --  136 139 142 141  K 5.6* 5.3* 5.3* 5.0 4.8 4.3  CL 104  --  109 112* 112* 111  CO2 22  --  19* 21* 23 21*  GLUCOSE 165*  --  200* 158* 110* 128*  BUN 98*  --  96* 88* 72* 51*  CREATININE 2.76*  --  2.29* 2.17* 2.06* 1.93*  CALCIUM 10.0  --  9.4 9.6 9.6 9.5   Liver Function Tests: No results for input(s): AST, ALT, ALKPHOS, BILITOT, PROT, ALBUMIN in the last 168 hours. No results for input(s): LIPASE, AMYLASE in the last 168 hours. No results for input(s): AMMONIA in the last 168 hours. CBC:  Recent Labs Lab 08/16/15 1030  08/17/15 1250 08/18/15 0200 08/18/15 1700 08/19/15 0310 08/20/15 0300  WBC 14.4*  < > 15.3* 14.2* 15.7* 12.4* 11.5*  NEUTROABS 12.1*  --   --   --   --   --   --   HGB 5.2*  < > 8.0* 7.4* 7.8* 7.7* 7.2*  HCT 16.8*  < > 25.0* 23.8* 23.8* 24.9*  23.3*  MCV 99.4  < > 91.6 91.9 91.9 94.7 96.7  PLT 175  < > 124* 127* 122* 134* 132*  < > = values in this interval not displayed. Cardiac Enzymes: No results for input(s): CKTOTAL, CKMB, CKMBINDEX, TROPONINI in the last 168 hours. BNP (last 3 results)  Recent Labs  12/17/14 1612  PROBNP 447.0*   CBG:  Recent Labs Lab 08/16/15 1540  GLUCAP 121*    Recent Results (from the past 240 hour(s))  MRSA PCR Screening     Status: None   Collection Time: 08/16/15  7:19 PM  Result Value Ref Range Status   MRSA by PCR NEGATIVE NEGATIVE Final    Comment:        The GeneXpert MRSA Assay (FDA approved for NASAL specimens only), is one component of a comprehensive MRSA colonization surveillance program. It is not intended to diagnose MRSA infection nor to guide or monitor treatment for MRSA  infections.          Studies: No results found.      Scheduled Meds: . sodium chloride   Intravenous Once  . antiseptic oral rinse  7 mL Mouth Rinse BID  . atorvastatin  10 mg Oral Daily  . [START ON 08/23/2015] darbepoetin (ARANESP) injection - NON-DIALYSIS  40 mcg Subcutaneous Q Tue-1800  . furosemide  80 mg Oral Daily  . iron dextran (INFED/DEXFERRUM) infusion  500 mg Intravenous Q24 Hr x 2  . sodium chloride  3 mL Intravenous Q12H   Continuous Infusions:    Principal Problem:   Symptomatic anemia Active Problems:   Secondary pulmonary hypertension (HCC)   Rheumatoid arthritis (HCC)   OSA on CPAP   Chronic diastolic heart failure (HCC)   Chronic respiratory failure with hypoxia (HCC)   Mitral regurgitation   Normocytic anemia   CKD (chronic kidney disease) stage 3, GFR 30-59 ml/min   HTN (hypertension)   Leukocytosis   Acute hyperkalemia   Myelodysplastic syndrome (HCC)   Anemia   Heme positive stool   History of colonic polyps   Benign neoplasm of cecum   Benign neoplasm of ascending colon    Time spent: 25 minutes.    Vernell Leep, MD, FACP, FHM. [facility] Pager [phone]  If 7PM-7AM, please contact night-coverage [email or web address] Password [email or web address] 08/20/2015, 11:05 AM    LOS: 4 days

## 2015-08-20 NOTE — Progress Notes (Signed)
New MD order to start Amiodarone infusion with bolus delayed due to loss of IV access. Once access regained, amiodarone bolus stopped halfway through because patient SBP 89 at beginning of bolus and 74 at next check. Patient AOx4, resting comfortably in bed, no complaints. Still in AFib rate controlled. MD Bensimhon notified, he advised to begin the continuous infusion at 33.3, go ahead and start 2nd unit of blood, and to notify primary MD of BP drops for possible addition of meds to keep BP up. Will continue to monitor closely.

## 2015-08-20 NOTE — Progress Notes (Signed)
This morning pt was feeling some abdominal discomfort described as gas pains. RN suggested to go for a walk.  We walked down a half of one hallway and pt was ready to return.  No change in oxygen saturation or heart rate but pt was feeling very "winded".  Pt has now bathed/brushed teeth and resting in bed. Blood pressures have been soft but pt stated that her normal blood pressure is 50-60 syst? RN will continue to monitor.

## 2015-08-21 ENCOUNTER — Encounter (HOSPITAL_COMMUNITY)
Admission: EM | Disposition: A | Discharge status: Home / Self Care | Payer: Self-pay | Source: Home / Self Care | Attending: Internal Medicine

## 2015-08-21 DIAGNOSIS — D509 Iron deficiency anemia, unspecified: Principal | ICD-10-CM

## 2015-08-21 HISTORY — PX: GIVENS CAPSULE STUDY: SHX5432

## 2015-08-21 LAB — TYPE AND SCREEN
ABO/RH(D): O POS
Antibody Screen: NEGATIVE
UNIT DIVISION: 0
UNIT DIVISION: 0

## 2015-08-21 LAB — BASIC METABOLIC PANEL
ANION GAP: 8 (ref 5–15)
BUN: 39 mg/dL — AB (ref 6–20)
CHLORIDE: 109 mmol/L (ref 101–111)
CO2: 21 mmol/L — ABNORMAL LOW (ref 22–32)
Calcium: 9.5 mg/dL (ref 8.9–10.3)
Creatinine, Ser: 1.79 mg/dL — ABNORMAL HIGH (ref 0.44–1.00)
GFR calc Af Amer: 33 mL/min — ABNORMAL LOW (ref >60)
GFR, EST NON AFRICAN AMERICAN: 28 mL/min — AB (ref >60)
Glucose, Bld: 124 mg/dL — ABNORMAL HIGH (ref 65–99)
POTASSIUM: 4.2 mmol/L (ref 3.5–5.1)
SODIUM: 138 mmol/L (ref 135–145)

## 2015-08-21 LAB — CBC
HCT: 29.3 % — ABNORMAL LOW (ref 36.0–46.0)
HEMOGLOBIN: 9.2 g/dL — AB (ref 12.0–15.0)
MCH: 29.3 pg (ref 26.0–34.0)
MCHC: 31.4 g/dL (ref 30.0–36.0)
MCV: 93.3 fL (ref 78.0–100.0)
PLATELETS: 136 10*3/uL — AB (ref 150–400)
RBC: 3.14 MIL/uL — AB (ref 3.87–5.11)
RDW: 18.5 % — ABNORMAL HIGH (ref 11.5–15.5)
WBC: 11.3 10*3/uL — AB (ref 4.0–10.5)

## 2015-08-21 SURGERY — IMAGING PROCEDURE, GI TRACT, INTRALUMINAL, VIA CAPSULE
Anesthesia: LOCAL

## 2015-08-21 MED ORDER — SODIUM CHLORIDE 0.9 % IV SOLN
INTRAVENOUS | Status: DC
Start: 2015-08-21 — End: 2015-08-21

## 2015-08-21 MED ORDER — SODIUM CHLORIDE 0.9 % IV SOLN: INTRAVENOUS | Status: DC

## 2015-08-21 MED ORDER — FENTANYL CITRATE (PF) 100 MCG/2ML IJ SOLN: 25.0000 ug | INTRAMUSCULAR | Status: DC | PRN

## 2015-08-21 MED ORDER — SODIUM CHLORIDE 0.9 % IV SOLN
500.0000 mg | Freq: Once | INTRAVENOUS | Status: AC
  Administered 2015-08-21: 500 mg via INTRAVENOUS
  Filled 2015-08-21: qty 10

## 2015-08-21 SURGICAL SUPPLY — 1 items: TOWEL COTTON PACK 4EA (MISCELLANEOUS) ×4 IMPLANT

## 2015-08-21 NOTE — Progress Notes (Signed)
PROGRESS NOTE    April Mueller [id number] DOB: [birth year] DOA: 08/16/2015 PCP:  Melinda Crutch, MD  Hematology: Dr. Sullivan Lone. Cardiology: Dr. [name] Rheumatology: Dr. Gavin Pound Pulmonology: Dr. 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 She has chronic black stools attributed to iron supplements. Patient was transfused 3 units of PRBCs. Baseline hemoglobin set to be in the 9-10 grams per DL range. [place] [facility] consulted. Status post EGD (normal) and colonoscopy (multiple polyps-polypectomy) but no obvious source of bleeding.   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 Acute anemia may have been due to GI blood loss. - Follows with Dr. Irene Limbo, Hematology. Patient was on iron infusions and Epogen. - Anemia panel: Iron 13, TIBC 311, saturation ratio 4, ferritin 17, folate 42, B12: 539. Will provide a dose of IV iron per pharmacy. - She had received 3 units PRBCs earlier in admission. Hemoglobin had improved in the mid 7 g range and then dropped to 7.2 on 1/14. Due to symptomatic anemia, transfused additional 2 units PRBCs on 1/14. Hemoglobin improved to 9.2. Follow CBC in a.m. and transfuse if hemoglobin <7 g per DL. - Completed EGD and colonoscopy on 1/13. Started capsule endoscopy 1/15. Will complete GI workup this admission.   FOBT +/possible GI bleed - [place] [facility] input appreciated.  - Following cardiology clearance, patient underwent EGD and  colonoscopy  on 1/13 without obvious source of bleeding identified. It was felt prudent to proceed with capsule endoscopy while hospitalized due to initial presentation with severe symptomatic anemia.  - started capsule endoscopy on 1/15. Follow  Hypotension, on 1/14  - May be related to volume depletion from bowel prep for colonoscopy and anemia. Transfused 2 units of PRBCs. Toprol and amlodipine on hold. Hold Lasix for today as well. Monitor closely. Blood pressures remain in the 90/50 range.   Stage IV chronic kidney disease - Creatinine the past a daily improved from 2-1.79 range.   Chronic diastolic CHF/pulmonary hypertension - Compensated. Lasix and Toprol currently on hold.  Thrombocytopenia - Stable  Essential hypertension - Hypotensive. Holding amlodipine and Toprol for now.  Rheumatoid arthritis - Follows with Dr. Gavin Pound as outpatient. Plaquenil held currently.  OSA on CPAP/chronic respiratory failure on home oxygen - Continue CPAP and oxygen supplementation.  Severe MR - Not candidate for surgical intervention.  Hyperkalemia - Resolved  New onset A. fib with controlled ventricular rate.  - First noted on telemetry on 1/14. Cardiology follow-up appreciated. Started on amiodarone drip.? Transition to oral amiodarone. Not anticoagulation candidate secondary to presumed GI bleeding and severe symptomatic anemia on presentation.    DVT prophylaxis: SCDs Code Status: Full Family Communication: None at bedside Disposition Plan: Continue management in stepdown unit. Not medically ready for discharge.    Consultants:  [facility]  Cardiology  Procedures:  Nightly CPAP  Colonoscopy 08/19/15:   ENDOSCOPIC IMPRESSION: 1. Two sessile polyps measuring 3 and 10 mm mm in size were found at the cecum; polypectomies were performed with cold snare; hemoclip placement x 1 at site of  larger polypectomy 2. Three sessile polyps ranging from 5 to 27mm in size were  found in the ascending colon; polypectomies were performed with a cold snare 3. There was severe diverticulosis noted in the sigmoid colon  RECOMMENDATIONS: 1. Await pathology results 2. High fiber diet 3. Follow-up with hematology as an outpatient. Monitor Hgb and iron studies. If anemia progresses or further evidence of GI bleeding, then would recommend referral back to GI for video capsule endoscopy.   EGD 08/19/15: ENDOSCOPIC IMPRESSION: Normal EGD  RECOMMENDATIONS: Proceed with a Colonoscopy.  Antimicrobials:  None   Subjective: Has not ambulated since yesterday. Denies dizziness or lightheadedness. No BM since endoscopies. As per RN, no acute events.   Objective: Filed Vitals:   08/21/15 0400 08/21/15 0500 08/21/15 0800 08/21/15 0824  BP: 99/65 97/58 90/55  90/55  Pulse: 68 65 68 65  Temp: 97.9 F (36.6 C)   98.8 F (37.1 C)  TempSrc: Oral   Oral  Resp: 15 20 30 23   Height:      Weight:  96.2 kg (212 lb 1.3 oz)    SpO2: 100%   99%    Intake/Output Summary (Last 24 hours) at 08/21/15 1125 Last data filed at 08/21/15 0919  Gross per 24 hour  Intake 1913.4 ml  Output   2200 ml  Net -286.6 ml   Filed Weights   08/19/15 1942 08/20/15 0500 08/21/15 0500  Weight: 94 kg (207 lb 3.7 oz) 96.2 kg (212 lb 1.3 oz) 96.2 kg (212 lb 1.3 oz)    Exam:  General exam: Pleasant middle-aged female lying comfortably in bed this morning. Respiratory system: Clear. No increased work of breathing. Cardiovascular system: S1 & S2 heard, RRR. No JVD, murmurs, gallops, clicks or pedal edema. Telemetry: A. fib with controlled ventricular rate. Some artifacts also seen but doesn't seem like true NSVT. Gastrointestinal system: Abdomen is nondistended, soft and nontender. Normal bowel sounds heard. Central nervous system: Alert and oriented. No focal neurological deficits. Extremities: Symmetric 5 x 5 power.   Data Reviewed: Basic Metabolic Panel:  Recent Labs Lab  08/17/15 0224 08/18/15 0200 08/19/15 0310 08/20/15 0300 08/21/15 0403  NA 136 139 142 141 138  K 5.3* 5.0 4.8 4.3 4.2  CL 109 112* 112* 111 109  CO2 19* 21* 23 21* 21*  GLUCOSE 200* 158* 110* 128* 124*  BUN 96* 88* 72* 51* 39*  CREATININE 2.29* 2.17* 2.06* 1.93* 1.79*  CALCIUM 9.4 9.6 9.6 9.5 9.5   Liver Function Tests: No results for input(s): AST, ALT, ALKPHOS, BILITOT, PROT, ALBUMIN in the last 168 hours. No results for input(s): LIPASE, AMYLASE in the last 168 hours. No results for input(s): AMMONIA in the last 168 hours. CBC:  Recent Labs Lab 08/16/15 1030  08/18/15 0200 08/18/15 1700 08/19/15 0310 08/20/15 0300 08/21/15 0403  WBC 14.4*  < > 14.2* 15.7* 12.4* 11.5* 11.3*  NEUTROABS 12.1*  --   --   --   --   --   --   HGB 5.2*  < > 7.4* 7.8* 7.7* 7.2* 9.2*  HCT 16.8*  < > 23.8* 23.8* 24.9* 23.3* 29.3*  MCV 99.4  < > 91.9 91.9 94.7 96.7 93.3  PLT 175  < > 127* 122* 134* 132* 136*  < > = values in this interval not displayed. Cardiac Enzymes: No results for input(s): CKTOTAL, CKMB, CKMBINDEX, TROPONINI in the last 168 hours. BNP (last 3 results)  Recent Labs  12/17/14 1612  PROBNP 447.0*   CBG:  Recent Labs Lab 08/16/15 1540  GLUCAP 121*    Recent Results (from the past 240 hour(s))  MRSA PCR Screening     Status: None   Collection Time: 08/16/15  7:19 PM  Result Value Ref Range Status   MRSA by PCR NEGATIVE NEGATIVE Final    Comment:        The GeneXpert MRSA Assay (FDA approved for NASAL specimens only), is one component of a comprehensive MRSA colonization surveillance program. It is not intended to diagnose MRSA infection nor to guide or monitor treatment for MRSA infections.          Studies: No results found.      Scheduled Meds: . sodium chloride   Intravenous Once  . antiseptic oral rinse  7 mL Mouth Rinse BID  . atorvastatin  10 mg Oral Daily  . [START ON 08/23/2015] darbepoetin (ARANESP) injection - NON-DIALYSIS  40 mcg  Subcutaneous Q Tue-1800  . sodium chloride  3 mL Intravenous Q12H   Continuous Infusions: . amiodarone 30 mg/hr (08/20/15 2235)    Principal Problem:   Symptomatic anemia Active Problems:   Secondary pulmonary hypertension (HCC)   Rheumatoid arthritis (HCC)   OSA on CPAP   Chronic diastolic heart failure (HCC)   Chronic respiratory failure with hypoxia (HCC)   Mitral regurgitation   Normocytic anemia   CKD (chronic kidney disease) stage 3, GFR 30-59 ml/min   HTN (hypertension)   Leukocytosis   Acute hyperkalemia   Myelodysplastic syndrome (HCC)   Anemia   Heme positive stool   History of colonic polyps   Benign neoplasm of cecum   Benign neoplasm of ascending colon   PAF (paroxysmal atrial fibrillation) ([place])    Time spent: 15 minutes.    Vernell Leep, MD, FACP, FHM. [facility] Pager [phone]  If 7PM-7AM, please contact night-coverage [email or web address] Password [email or web address] 08/21/2015, 11:25 AM    LOS: 5 days

## 2015-08-21 NOTE — Progress Notes (Signed)
Dear Doctor: April Mueller This patient has been identified as a candidate for PICC for the following reason (s): IV therapy over 48 hours and poor veins/poor circulatory system (CHF, COPD, emphysema, diabetes, steroid use, IV drug abuse, etc.) If you agree, please write an order for the indicated device. For any questions contact the Vascular Access Team at [phone] if no answer, please leave a message.  Thank you for supporting the early vascular access assessment program.

## 2015-08-21 NOTE — Progress Notes (Signed)
Patient ID: April Mueller, female   DOB: [birth year], 70 y.o.   MRN: [id number]    Subjective:  Underwent EGD/Colon 1/13 without source of bleeding. Pending capsule endo.   Received 2 more units of RBCs yesterday. Hgb now up to 9.2  Diuretics on hold due to low BP and volume depletion.  Started to on amio yesterday for AF. Remains in AF in 10s. Feels ok. No dyspnea.     Scheduled Meds: . sodium chloride   Intravenous Once  . antiseptic oral rinse  7 mL Mouth Rinse BID  . atorvastatin  10 mg Oral Daily  . [START ON 08/23/2015] darbepoetin (ARANESP) injection - NON-DIALYSIS  40 mcg Subcutaneous Q Tue-1800  . sodium chloride  3 mL Intravenous Q12H   Continuous Infusions: . amiodarone 30 mg/hr (08/20/15 2235)   PRN Meds:.acetaminophen **OR** [DISCONTINUED] acetaminophen, ALPRAZolam, famotidine, ondansetron **OR** [DISCONTINUED] ondansetron (ZOFRAN) IV   Filed Vitals:   08/21/15 0500 08/21/15 0800 08/21/15 0824 08/21/15 1148  BP: 97/58 90/55 90/55  96/52  Pulse: 65 68 65   Temp:   98.8 F (37.1 C) 98.9 F (37.2 C)  TempSrc:   Oral Oral  Resp: 20 30 23 24   Height:      Weight: 96.2 kg (212 lb 1.3 oz)     SpO2:   99%     Intake/Output Summary (Last 24 hours) at 08/21/15 1207 Last data filed at 08/21/15 1148  Gross per 24 hour  Intake 1913.4 ml  Output   2150 ml  Net -236.6 ml    LABS: Basic Metabolic Panel:  Recent Labs  08/20/15 0300 08/21/15 0403  NA 141 138  K 4.3 4.2  CL 111 109  CO2 21* 21*  GLUCOSE 128* 124*  BUN 51* 39*  CREATININE 1.93* 1.79*  CALCIUM 9.5 9.5   Liver Function Tests: No results for input(s): AST, ALT, ALKPHOS, BILITOT, PROT, ALBUMIN in the last 72 hours. No results for input(s): LIPASE, AMYLASE in the last 72 hours. CBC:  Recent Labs  08/20/15 0300 08/21/15 0403  WBC 11.5* 11.3*  HGB 7.2* 9.2*  HCT 23.3* 29.3*  MCV 96.7 93.3  PLT 132* 136*   Cardiac Enzymes: No results for input(s): CKTOTAL, CKMB, CKMBINDEX, TROPONINI in  the last 72 hours. BNP: Invalid input(s): POCBNP D-Dimer: No results for input(s): DDIMER in the last 72 hours. Hemoglobin A1C: No results for input(s): HGBA1C in the last 72 hours. Fasting Lipid Panel: No results for input(s): CHOL, HDL, LDLCALC, TRIG, CHOLHDL, LDLDIRECT in the last 72 hours. Thyroid Function Tests: No results for input(s): TSH, T4TOTAL, T3FREE, THYROIDAB in the last 72 hours.  Invalid input(s): FREET3 Anemia Panel: No results for input(s): VITAMINB12, FOLATE, FERRITIN, TIBC, IRON, RETICCTPCT in the last 72 hours.  RADIOLOGY: Dg Chest 2 View  08/16/2015  CLINICAL DATA:  Weakness, shortness of breath for 5 days EXAM: CHEST  2 VIEW COMPARISON:  12/17/2014 FINDINGS: Cardiomediastinal silhouette is stable. Again noted dilated aortic arch and descending aorta. Status post median sternotomy. Stable right perihilar scarring. Central mild vascular congestion without convincing pulmonary edema. Mild left basilar atelectasis. Mild degenerative changes thoracic spine. No definite superimposed infiltrate or pulmonary edema. IMPRESSION: Cardiomegaly again noted. 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 Status post median sternotomy. Stable scarring right midlung. Central mild vascular congestion without pulmonary edema. Mild left basilar atelectasis. No definite superimposed infiltrate. Electronically Signed   By: Lahoma Crocker M.D.   On: 08/16/2015 11:38    PHYSICAL EXAM General:  NAD Neck: JVP flat cm, no thyromegaly or thyroid nodule.  Lungs: Mildly reduced breath sounds bilaterally. CV: Nondisplaced PMI.  Heart irregular S1/S2, no S3/S4, 2/6 HSM apex.  No peripheral edema.   Abdomen: Soft, nontender, no hepatosplenomegaly, no distention.  Neurologic: Alert and oriented x 3.  Psych: Normal affect. Extremities: No clubbing or cyanosis.   TELEMETRY: Reviewed telemetry pt in NSR  ASSESSMENT AND PLAN: April Mueller is a 70 yo with COPD, former smoker (28  pk-yrs), obesity, RA , CKD 4, Type A aortic dissection s/p repair 2005 c/b renal failure requiring HD x 3 months, PH - previously treated in [place]. Admitted with profound iron deficiency anemia. EGD/colon 1/13 negative. Pending capsule endo.  1. 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 This appeared to be primarily pulmonary venous hypertension. Interestingly, there were very prominent V waves in the PCWP tracing. On exam, she has a prominent MR murmur. I was concerned that significant MR may be playing a major role in her symptomatology. I did a TEE in 6/16 showing very eccentric, posteriorly-directed mitral regurgitation possibly from subtle anterior leaflet prolapse. I think that the MR could be severe but is difficult to fully visualize. Her pulmonologist does not think that lung parenchymal disease adequately explains her degree of hypoxemia and dyspnea. She was seen at [facility] for consideration of percutaneous MV clip and was not thought to be a good candidate => concern that MV disease may not actually be severe and concern that her dyspnea is coming from something else and would not be improved by MV clipping. - Volume status low after bowel prep. Holding lasix for now. - She is currently off amlodipine, Toprol XL, and spironolactone with soft BP.  2. Chronic respiratory failure: She is on 4-5 L home oxygen by [place]. I have been concerned that she does have significant underlying lung parenchymal disease. However, her pulmonologist (Dr Melvyn Novas), did not think she had a significant degree of obstructive lung disease on last PFTs done in the last year. He thought that the restriction was primarily due to her body habitus.  - Continue supplemental oxygen.  3. PAF - This is apparently new. Rate controlled. Not candidate for anticoagulation due to bleeding - Will  continue IV amio in hoes to convert her within 48 hours.  4. Anemia: Profound anemia at admission, suspect GI blood loss, probably upper with black stool and elevated BUN.   --EGD/colon on 1/13 without source. Planning capsule endo -Hgb up to 9.2 with additional transfusion.  5. OSA: Needs CPAP nightly.  6. Acute on CKD: Stage IV. Creatinine initially elevated, now back to her baseline at 1.8 7. Aortic dissection s/p repair 2005: Although complete records are not available, it appears that the patient developed a Type A dissection in 2005 with ascending aorta repair (can see the surgically repaired ascending aorta well on TEE). She has residual dissection in the descending thoracic aorta.  8. Pulmonary hypertension: I think that this is primarily WHO group 2 (pulmonary venous hypertension) and group 3 (PH due to intrinsic lung disease) rather than primarily WHO group 1 PAH. RHC 1/16 showed low PVR. She is now off macitentan has felt better off it. She may need eventual RHC. 9. Mitral regurgitation: Prior TEE concerning for possibly severe, eccentric mitral regurgitation, most likely due to subtle prolapse of the anterior mitral valve leaflet. I have been concerned that the MR is  playing a role in her CHF. She had prominent V-waves on RHC 1/16, and she has a mitral area murmur. I do not think that she is a good candidate for open MV surgery. I had her evaluated at [facility] for possible percutaneous mitral clipping. She was turned down for this as her MR was not clearly severe and fixing it may not help her hypoxemia appreciably. Echo this admission appears to show moderate MR.   Celestina Gironda, [place] 08/21/2015 12:07 PM

## 2015-08-22 ENCOUNTER — Encounter (HOSPITAL_COMMUNITY): Payer: Self-pay | Admitting: Internal Medicine

## 2015-08-22 ENCOUNTER — Encounter: Payer: Self-pay | Admitting: Internal Medicine

## 2015-08-22 DIAGNOSIS — N183 Chronic kidney disease, stage 3 (moderate): Secondary | ICD-10-CM

## 2015-08-22 LAB — CBC
HEMATOCRIT: 30.7 % — AB (ref 36.0–46.0)
Hemoglobin: 9.5 g/dL — ABNORMAL LOW (ref 12.0–15.0)
MCH: 29.5 pg (ref 26.0–34.0)
MCHC: 30.9 g/dL (ref 30.0–36.0)
MCV: 95.3 fL (ref 78.0–100.0)
PLATELETS: 145 10*3/uL — AB (ref 150–400)
RBC: 3.22 MIL/uL — ABNORMAL LOW (ref 3.87–5.11)
RDW: 18.8 % — AB (ref 11.5–15.5)
WBC: 11.1 10*3/uL — AB (ref 4.0–10.5)

## 2015-08-22 LAB — BASIC METABOLIC PANEL
Anion gap: 5 (ref 5–15)
BUN: 33 mg/dL — ABNORMAL HIGH (ref 6–20)
CALCIUM: 9.5 mg/dL (ref 8.9–10.3)
CO2: 23 mmol/L (ref 22–32)
CREATININE: 1.79 mg/dL — AB (ref 0.44–1.00)
Chloride: 111 mmol/L (ref 101–111)
GFR, EST AFRICAN AMERICAN: 33 mL/min — AB (ref >60)
GFR, EST NON AFRICAN AMERICAN: 28 mL/min — AB (ref >60)
GLUCOSE: 111 mg/dL — AB (ref 65–99)
Potassium: 4.9 mmol/L (ref 3.5–5.1)
Sodium: 139 mmol/L (ref 135–145)

## 2015-08-22 MED ORDER — SENNA 8.6 MG PO TABS
2.0000 | ORAL_TABLET | Freq: Every day | ORAL | Status: DC
  Administered 2015-08-23: 17.2 mg via ORAL
  Filled 2015-08-22 (×3): qty 2

## 2015-08-22 NOTE — Progress Notes (Signed)
Patient ID: April Mueller, female   DOB: [birth year], 70 y.o.   MRN: [id number]    Subjective:  Underwent EGD/Colon 1/13 without source of bleeding. Pending capsule endo.   Received 2 more units of RBCs yesterday. Hgb now up to 9.2  Diuretics on hold due to low BP and volume depletion.  Started amio 08/20/15 for AF. Remains in AF in 39s. Feels good today. States she was having a lot of palpitations yesterday but none today. Denies shortness of breath or chest pain.     Scheduled Meds: . sodium chloride   Intravenous Once  . antiseptic oral rinse  7 mL Mouth Rinse BID  . atorvastatin  10 mg Oral Daily  . [START ON 08/23/2015] darbepoetin (ARANESP) injection - NON-DIALYSIS  40 mcg Subcutaneous Q Tue-1800  . sodium chloride  3 mL Intravenous Q12H   Continuous Infusions: . amiodarone 30 mg/hr (08/22/15 0130)   PRN Meds:.acetaminophen **OR** [DISCONTINUED] acetaminophen, ALPRAZolam, famotidine, ondansetron **OR** [DISCONTINUED] ondansetron (ZOFRAN) IV   Filed Vitals:   08/22/15 0000 08/22/15 0400 08/22/15 0500 08/22/15 0737  BP: 107/59 109/62  122/59  Pulse: 70 59  74  Temp: 98.4 F (36.9 C) 97.9 F (36.6 C)  98.2 F (36.8 C)  TempSrc: Oral Oral  Oral  Resp: 22 21  26   Height:      Weight:   209 lb 7 oz (95 kg)   SpO2: 100% 100%  100%    Intake/Output Summary (Last 24 hours) at 08/22/15 1028 Last data filed at 08/22/15 0600  Gross per 24 hour  Intake  546.7 ml  Output    550 ml  Net   -3.3 ml    LABS: Basic Metabolic Panel:  Recent Labs  08/21/15 0403 08/22/15 0300  NA 138 139  K 4.2 4.9  CL 109 111  CO2 21* 23  GLUCOSE 124* 111*  BUN 39* 33*  CREATININE 1.79* 1.79*  CALCIUM 9.5 9.5   Liver Function Tests: No results for input(s): AST, ALT, ALKPHOS, BILITOT, PROT, ALBUMIN in the last 72 hours. No results for input(s): LIPASE, AMYLASE in the last 72 hours. CBC:  Recent Labs  08/21/15 0403 08/22/15 0300  WBC 11.3* 11.1*  HGB 9.2* 9.5*  HCT 29.3* 30.7*   MCV 93.3 95.3  PLT 136* 145*   Cardiac Enzymes: No results for input(s): CKTOTAL, CKMB, CKMBINDEX, TROPONINI in the last 72 hours. BNP: Invalid input(s): POCBNP D-Dimer: No results for input(s): DDIMER in the last 72 hours. Hemoglobin A1C: No results for input(s): HGBA1C in the last 72 hours. Fasting Lipid Panel: No results for input(s): CHOL, HDL, LDLCALC, TRIG, CHOLHDL, LDLDIRECT in the last 72 hours. Thyroid Function Tests: No results for input(s): TSH, T4TOTAL, T3FREE, THYROIDAB in the last 72 hours.  Invalid input(s): FREET3 Anemia Panel: No results for input(s): VITAMINB12, FOLATE, FERRITIN, TIBC, IRON, RETICCTPCT in the last 72 hours.  RADIOLOGY: Dg Chest 2 View  08/16/2015  CLINICAL DATA:  Weakness, shortness of breath for 5 days EXAM: CHEST  2 VIEW COMPARISON:  12/17/2014 FINDINGS: Cardiomediastinal silhouette is stable. Again noted dilated aortic arch and descending aorta. Status post median sternotomy. Stable right perihilar scarring. Central mild vascular congestion without convincing pulmonary edema. Mild left basilar atelectasis. Mild degenerative changes thoracic spine. No definite superimposed infiltrate or pulmonary edema. IMPRESSION: Cardiomegaly again noted. 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 Status post median sternotomy. Stable scarring right midlung. Central mild vascular congestion without pulmonary edema. Mild left basilar atelectasis. No  definite superimposed infiltrate. Electronically Signed   By: April Mueller M.D.   On: 08/16/2015 11:38    PHYSICAL EXAM General: NAD Neck: JVP flat, no thyromegaly or lymphadenopathy noted.  Lungs: Mildly diminished bases. CV: Nondisplaced PMI.  Heart irregular S1/S2, no S3/S4, 2/6 HSM apex.  No peripheral edema.   Abdomen: Soft, NT, ND, no HSM. No bruits or masses. +BS  Neurologic: Alert and oriented x 3.  Psych: Normal affect. Extremities: No clubbing or cyanosis.   TELEMETRY:Reviewed  personally, Afib 60s  ASSESSMENT AND PLAN: [name] is a 70 yo with COPD, former smoker (28 pk-yrs), obesity, RA , CKD 4, Type A aortic dissection s/p repair 2005 c/b renal failure requiring HD x 3 months, PH - previously treated in [place]. Admitted with profound iron deficiency anemia. EGD/colon 1/13 negative. Pending capsule endo (swallowed 08/21/15, likely to be reviewed today per GI note)  1. 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 This appeared to be primarily pulmonary venous hypertension. Interestingly, there were very prominent V waves in the PCWP tracing. On exam, she has a prominent MR murmur. I was concerned that significant MR may be playing a major role in her symptomatology. I did a TEE in 6/16 showing very eccentric, posteriorly-directed mitral regurgitation possibly from subtle anterior leaflet prolapse. I think that the MR could be severe but is difficult to fully visualize. Her pulmonologist does not think that lung parenchymal disease adequately explains her degree of hypoxemia and dyspnea. She was seen at [facility] for consideration of percutaneous MV clip and was not thought to be a good candidate => concern that MV disease may not actually be severe and concern that her dyspnea is coming from something else and would not be improved by MV clipping.Volume status looks ok today.  - Restart Lasix at home dose tomorrow.  - She is currently off amlodipine, Toprol XL, and spironolactone with soft BP.  2. Chronic respiratory failure: She is on 4-5 L home oxygen by [place]. I have been concerned that she does have significant underlying lung parenchymal disease. However, her pulmonologist (Dr April Mueller), did not think she had a significant degree of obstructive lung disease on last PFTs done in the last year. He thought that the restriction was primarily due to  her body habitus.  - Continue supplemental oxygen.  3. Atrial fibrillation: Persistent.  This is newly noted this admission.  At risk with significant mitral regurgitation.  HR currently controlled on amiodarone gtt. Not candidate for anticoagulation due to bleeding - Continue IV amiodarone today (within 48 hours of atrial fibrillation initiation).  If she does not convert back to NSR, will stop amiodarone and use Toprol XL.  With lung disease, I am concerned about long-term amiodarone for her.    - CHADSVASC = 3, ideally would be anticoagulated but not candidate currently.  Possibly [facility] candidate down the road.  4. Anemia: Profound anemia at admission, suspect GI blood loss, probably upper with black stool and elevated BUN.  - EGD/colon on 1/13 without clear source. Capsule endoscopy done yesterday, await results.  - Hemoglobin back up after repeat transfusion, follow closely.  5. OSA: Needs CPAP nightly.  6. Acute on CKD: Stage IV. Creatinine initially elevated, now back to her baseline at 1.79 7. Aortic dissection s/p repair 2005: Although complete records are not available, it appears that the patient developed a Type A dissection in 2005 with  ascending aorta repair (can see the surgically repaired ascending aorta well on TEE). She has residual dissection in the descending thoracic aorta.  8. Pulmonary hypertension: I think that this is primarily WHO group 2 (pulmonary venous hypertension) and group 3 (PH due to intrinsic lung disease) rather than primarily WHO group 1 PAH. RHC 1/16 showed low PVR. She is now off macitentan has felt better off it. She may need eventual RHC. 9. Mitral regurgitation: Prior TEE concerning for possibly severe, eccentric mitral regurgitation, most likely due to subtle prolapse of the anterior mitral valve leaflet. I have been concerned that the MR is playing a role in her CHF. She had prominent V-waves on RHC 1/16, and she has a mitral area murmur. I  do not think that she is a good candidate for open MV surgery. I had her evaluated at [facility] for possible percutaneous mitral clipping. She was turned down for this as her MR was not clearly severe and fixing it may not help her hypoxemia appreciably. Echo this admission appears to show moderate MR.   [name] 08/22/2015 11:55 AM

## 2015-08-22 NOTE — Progress Notes (Signed)
PROGRESS NOTE    April Mueller [id number] DOB: 1945-08-29 DOA: 08/16/2015 PCP:  Melinda Crutch, MD  Hematology: Dr. Sullivan Lone. Cardiology: Dr. [name] Rheumatology: Dr. Gavin Pound Pulmonology: Dr. Baltazar Apo  HPI/Brief narrative 70 year old female patient with history of COPD, obesity, rheumatoid arthritis on Plaquenil, type A aortic dissection status post repair 2005, PAH, OSA on nightly CPAP, chronic diastolic CHF, chronic kidney disease IV, severe MR-not a good candidate for MV clip, anemia related to CKD/RA/MDS, chronic respiratory failure on home oxygen admitted to [facility] on 08/16/15 with symptomatic anemia and hemoglobin of 5.2. She has chronic black stools attributed to iron supplements. Patient was transfused 3 units of PRBCs. Baseline hemoglobin set to be in the 9-10 grams per DL range. [facility] consulted. Status post EGD (normal) and colonoscopy (multiple polyps-polypectomy) but no obvious source of bleeding. Capsule endoscopy results pending. New onset A. fib with controlled ventricular rate-on IV amiodarone.   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 Acute anemia may have been due to GI blood loss. - Follows with Dr. Irene Limbo, Hematology. Patient was on iron infusions and Epogen. - Anemia panel: Iron 13, TIBC 311, saturation ratio 4, ferritin 17, folate 42, B12: 539. Will provide a dose of IV iron per pharmacy. - She had received 3 units PRBCs earlier in admission. Hemoglobin had improved in the mid 7 g range and then dropped to 7.2 on 1/14. Due to symptomatic anemia, transfused additional 2 units PRBCs on 1/14. Hemoglobin improved to 9.2. Follow CBC in a.m. and transfuse if hemoglobin <7 g per DL. - Completed EGD and colonoscopy on 1/13. Completed capsule endoscopy 1/16-results pending. Will complete GI workup this admission.  -  Hemoglobin stable in the low 9 g range over the last 24 hours.  FOBT +/possible GI bleed - [facility] input appreciated.  - Following cardiology clearance, patient underwent EGD and colonoscopy  on 1/13 without obvious source of bleeding identified. It was felt prudent to proceed with capsule endoscopy while hospitalized due to initial presentation with severe symptomatic anemia.  - Status post capsule endoscopy-results pending.   Hypotension, on 1/14  - May be related to volume depletion from bowel prep for colonoscopy and anemia. Transfused 2 units of PRBCs. Toprol and amlodipine on hold. Held Lasix. Resolved.   Stage IV chronic kidney disease - Creatinine the past a daily improved from 2-1.79 range. Creatinine back to baseline.  Chronic diastolic CHF/pulmonary hypertension - Compensated. Lasix and Toprol currently on hold. As per cardiology, resume Lasix 1/17.  Thrombocytopenia - Stable  Essential hypertension - Holding amlodipine and Toprol for now, due to recent hypotension.  Rheumatoid arthritis - Follows with Dr. Gavin Pound as outpatient. Plaquenil held currently.  OSA on CPAP/chronic respiratory failure on home oxygen - Continue CPAP and oxygen supplementation.  Severe MR - Not candidate for surgical intervention.  Hyperkalemia - Resolved  New onset A. fib with controlled ventricular rate.  - First noted on telemetry on 1/14. Cardiology follow-up appreciated. Started on amiodarone drip. As per cardiology follow-up, continue IV amiodarone for additional 24 hours and if does not revert to sinus rhythm, stop amiodarone given history of lung disease and uses Toprol-XL. - CHADSVASC = 3, ideally would be anticoagulated but not candidate currently. Possibly Watchman candidate down the road.  - Not anticoagulation candidate secondary to presumed GI bleeding and severe symptomatic anemia on presentation.  DVT prophylaxis: SCDs Code Status: Full Family Communication:  None at bedside Disposition Plan: Continue management in stepdown unit. Not medically ready for discharge.    Consultants:  [facility]  Cardiology  Procedures:  Nightly CPAP  Colonoscopy 08/19/15:   ENDOSCOPIC IMPRESSION: 1. Two sessile polyps measuring 3 and 10 mm mm in size were found at the cecum; polypectomies were performed with cold snare; hemoclip placement x 1 at site of larger polypectomy 2. Three sessile polyps ranging from 5 to 83mm in size were found in the ascending colon; polypectomies were performed with a cold snare 3. There was severe diverticulosis noted in the sigmoid colon  RECOMMENDATIONS: 1. Await pathology results 2. High fiber diet 3. Follow-up with hematology as an outpatient. Monitor Hgb and iron studies. If anemia progresses or further evidence of GI bleeding, then would recommend referral back to GI for video capsule endoscopy.   EGD 08/19/15: ENDOSCOPIC IMPRESSION: Normal EGD  RECOMMENDATIONS: Proceed with a Colonoscopy.  Antimicrobials:  None   Subjective: Palpitations yesterday. None today. No BM since colonoscopy.  Objective: Filed Vitals:   08/22/15 0000 08/22/15 0400 08/22/15 0500 08/22/15 0737  BP: 107/59 109/62  122/59  Pulse: 70 59  74  Temp: 98.4 F (36.9 C) 97.9 F (36.6 C)  98.2 F (36.8 C)  TempSrc: Oral Oral  Oral  Resp: 22 21  26   Height:      Weight:   95 kg (209 lb 7 oz)   SpO2: 100% 100%  100%    Intake/Output Summary (Last 24 hours) at 08/22/15 1204 Last data filed at 08/22/15 0600  Gross per 24 hour  Intake  546.7 ml  Output    350 ml  Net  196.7 ml   Filed Weights   08/20/15 0500 08/21/15 0500 08/22/15 0500  Weight: 96.2 kg (212 lb 1.3 oz) 96.2 kg (212 lb 1.3 oz) 95 kg (209 lb 7 oz)    Exam:  General exam: Pleasant middle-aged female sitting up comfortably in chair this morning. Respiratory system: Clear. No increased work of breathing. Cardiovascular system: S1 & S2 heard, RRR.  No JVD, murmurs, gallops, clicks or pedal edema. Telemetry: A. fib with controlled ventricular rate. Some artifacts also seen but doesn't seem like true NSVT. Gastrointestinal system: Abdomen is nondistended, soft and nontender. Normal bowel sounds heard. Central nervous system: Alert and oriented. No focal neurological deficits. Extremities: Symmetric 5 x 5 power.   Data Reviewed: Basic Metabolic Panel:  Recent Labs Lab 08/18/15 0200 08/19/15 0310 08/20/15 0300 08/21/15 0403 08/22/15 0300  NA 139 142 141 138 139  K 5.0 4.8 4.3 4.2 4.9  CL 112* 112* 111 109 111  CO2 21* 23 21* 21* 23  GLUCOSE 158* 110* 128* 124* 111*  BUN 88* 72* 51* 39* 33*  CREATININE 2.17* 2.06* 1.93* 1.79* 1.79*  CALCIUM 9.6 9.6 9.5 9.5 9.5   Liver Function Tests: No results for input(s): AST, ALT, ALKPHOS, BILITOT, PROT, ALBUMIN in the last 168 hours. No results for input(s): LIPASE, AMYLASE in the last 168 hours. No results for input(s): AMMONIA in the last 168 hours. CBC:  Recent Labs Lab 08/16/15 1030  08/18/15 1700 08/19/15 0310 08/20/15 0300 08/21/15 0403 08/22/15 0300  WBC 14.4*  < > 15.7* 12.4* 11.5* 11.3* 11.1*  NEUTROABS 12.1*  --   --   --   --   --   --   HGB 5.2*  < > 7.8* 7.7* 7.2* 9.2* 9.5*  HCT 16.8*  < >  23.8* 24.9* 23.3* 29.3* 30.7*  MCV 99.4  < > 91.9 94.7 96.7 93.3 95.3  PLT 175  < > 122* 134* 132* 136* 145*  < > = values in this interval not displayed. Cardiac Enzymes: No results for input(s): CKTOTAL, CKMB, CKMBINDEX, TROPONINI in the last 168 hours. BNP (last 3 results)  Recent Labs  12/17/14 1612  PROBNP 447.0*   CBG:  Recent Labs Lab 08/16/15 1540  GLUCAP 121*    Recent Results (from the past 240 hour(s))  MRSA PCR Screening     Status: None   Collection Time: 08/16/15  7:19 PM  Result Value Ref Range Status   MRSA by PCR NEGATIVE NEGATIVE Final    Comment:        The GeneXpert MRSA Assay (FDA approved for NASAL specimens only), is one component of  a comprehensive MRSA colonization surveillance program. It is not intended to diagnose MRSA infection nor to guide or monitor treatment for MRSA infections.          Studies: No results found.      Scheduled Meds: . sodium chloride   Intravenous Once  . antiseptic oral rinse  7 mL Mouth Rinse BID  . atorvastatin  10 mg Oral Daily  . [START ON 08/23/2015] darbepoetin (ARANESP) injection - NON-DIALYSIS  40 mcg Subcutaneous Q Tue-1800  . sodium chloride  3 mL Intravenous Q12H   Continuous Infusions: . amiodarone 30 mg/hr (08/22/15 0130)    Principal Problem:   Symptomatic anemia Active Problems:   Secondary pulmonary hypertension (HCC)   Rheumatoid arthritis (HCC)   OSA on CPAP   Chronic diastolic heart failure (HCC)   Chronic respiratory failure with hypoxia (HCC)   Mitral regurgitation   Normocytic anemia   CKD (chronic kidney disease) stage 3, GFR 30-59 ml/min   HTN (hypertension)   Leukocytosis   Acute hyperkalemia   Myelodysplastic syndrome (HCC)   Anemia   Heme positive stool   History of colonic polyps   Benign neoplasm of cecum   Benign neoplasm of ascending colon   PAF (paroxysmal atrial fibrillation) ([place])    Time spent: 15 minutes.    Vernell Leep, MD, FACP, FHM. [facility] Pager [phone]  If 7PM-7AM, please contact night-coverage [email or web address] Password [email or web address] 08/22/2015, 12:04 PM    LOS: 6 days

## 2015-08-22 NOTE — Care Management Important Message (Signed)
Important Message  Patient Details  Name: April Mueller MRN: [id number] Date of Birth: 1946-07-15   Medicare Important Message Given:  Yes    Loann Quill 08/22/2015, 12:29 PM

## 2015-08-22 NOTE — Procedures (Signed)
See scanned images and report also   Small bowel is normal

## 2015-08-22 NOTE — Progress Notes (Signed)
          Daily Rounding Note  08/22/2015, 8:22 AM  LOS: 6 days   SUBJECTIVE:       Some discomfort in LLQ, relieved after passing flatus.  No BM since prep.  Brething about the same.   OBJECTIVE:         Vital signs in last 24 hours:    Temp:  [97.9 F (36.6 C)-98.9 F (37.2 C)] 98.2 F (36.8 C) (01/16 0737) Pulse Rate:  [59-84] 74 (01/16 0737) Resp:  [18-26] 26 (01/16 0737) BP: (90-122)/(52-76) 122/59 mmHg (01/16 0737) SpO2:  [97 %-100 %] 100 % (01/16 0737) Weight:  [95 kg (209 lb 7 oz)] 95 kg (209 lb 7 oz) (01/16 0500) Last BM Date: 08/20/15 Filed Weights   08/20/15 0500 08/21/15 0500 08/22/15 0500  Weight: 96.2 kg (212 lb 1.3 oz) 96.2 kg (212 lb 1.3 oz) 95 kg (209 lb 7 oz)   General: looks the same, no distress, comfortable.     Heart: Irreg/irreg. 2/6 syst murmer.  Chest: clear bil.  But very diminished BS.  No resting dyspnea.  Abdomen: soft, NT, ND.  Normal BS.  Extremities: no CCE Neuro/Psych:  Pleasant, cooperative, calm, alert/oriented x 3.  No tremor or gross weakness.    Lab Results:  Recent Labs  08/20/15 0300 08/21/15 0403 08/22/15 0300  WBC 11.5* 11.3* 11.1*  HGB 7.2* 9.2* 9.5*  HCT 23.3* 29.3* 30.7*  PLT 132* 136* 145*   BMET  Recent Labs  08/20/15 0300 08/21/15 0403 08/22/15 0300  NA 141 138 139  K 4.3 4.2 4.9  CL 111 109 111  CO2 21* 21* 23  GLUCOSE 128* 124* 111*  BUN 51* 39* 33*  CREATININE 1.93* 1.79* 1.79*  CALCIUM 9.5 9.5 9.5     ASSESMENT:   *  Anemia, acute on chronic, FOBT+.  S/p PRBC x 4 1/10 - 1/14.  08/19/15 Colonoscopy: 5 polypectomies, 1 of which was endoclipped; dense sigmoid tics.  08/19/15 EGD: Normal Capsule endoscopy 1/15.  Dr Carlean Purl will read today.  Hgb stable last 48 hours.   *  Thrombocytopenia.   *  Multi med problems.  CKD 4, CHF, COPD, Pulm htn, OSA.    PLAN   *  Await reading of capsule endo, hopefully by this afternoon. April Mueller   08/22/2015, 8:22 AM Pager: [phone]    [facility] Attending   I have taken an interval history, reviewed the chart and examined the patient. I agree with the Advanced Practitioner's note, impression and recommendations.   Capsule endo - normal small bowel. I suspect anemia is multifactorial.- Chronic disease component likely - Hgb was 7 5 mos ago  No need for further GI evaluation. Does not need GI visit after dc Signing off  Gatha Mayer, MD, [facility] [phone] (pager) [phone] after 5 PM, weekends and holidays  08/22/2015 4:02 PM

## 2015-08-23 DIAGNOSIS — R195 Other fecal abnormalities: Secondary | ICD-10-CM

## 2015-08-23 DIAGNOSIS — I5033 Acute on chronic diastolic (congestive) heart failure: Secondary | ICD-10-CM | POA: Insufficient documentation

## 2015-08-23 LAB — BASIC METABOLIC PANEL
ANION GAP: 7 (ref 5–15)
BUN: 32 mg/dL — ABNORMAL HIGH (ref 6–20)
CALCIUM: 9.7 mg/dL (ref 8.9–10.3)
CO2: 23 mmol/L (ref 22–32)
Chloride: 107 mmol/L (ref 101–111)
Creatinine, Ser: 1.83 mg/dL — ABNORMAL HIGH (ref 0.44–1.00)
GFR, EST AFRICAN AMERICAN: 32 mL/min — AB (ref >60)
GFR, EST NON AFRICAN AMERICAN: 27 mL/min — AB (ref >60)
GLUCOSE: 124 mg/dL — AB (ref 65–99)
POTASSIUM: 4.8 mmol/L (ref 3.5–5.1)
SODIUM: 137 mmol/L (ref 135–145)

## 2015-08-23 LAB — CBC WITH DIFFERENTIAL/PLATELET
BASOS ABS: 0 10*3/uL (ref 0.0–0.1)
BASOS PCT: 0 %
Eosinophils Absolute: 0.3 10*3/uL (ref 0.0–0.7)
Eosinophils Relative: 2 %
HEMATOCRIT: 31.1 % — AB (ref 36.0–46.0)
Hemoglobin: 9.7 g/dL — ABNORMAL LOW (ref 12.0–15.0)
LYMPHS PCT: 8 %
Lymphs Abs: 1 10*3/uL (ref 0.7–4.0)
MCH: 30.3 pg (ref 26.0–34.0)
MCHC: 31.2 g/dL (ref 30.0–36.0)
MCV: 97.2 fL (ref 78.0–100.0)
MONO ABS: 1.1 10*3/uL — AB (ref 0.1–1.0)
Monocytes Relative: 9 %
NEUTROS ABS: 9.8 10*3/uL — AB (ref 1.7–7.7)
NEUTROS PCT: 81 %
Platelets: 140 10*3/uL — ABNORMAL LOW (ref 150–400)
RBC: 3.2 MIL/uL — AB (ref 3.87–5.11)
RDW: 18.8 % — AB (ref 11.5–15.5)
WBC: 12.1 10*3/uL — AB (ref 4.0–10.5)

## 2015-08-23 MED ORDER — FUROSEMIDE 40 MG PO TABS
40.0000 mg | ORAL_TABLET | Freq: Two times a day (BID) | ORAL | Status: DC
  Administered 2015-08-24 – 2015-08-25 (×3): 40 mg via ORAL
  Filled 2015-08-23 (×3): qty 1

## 2015-08-23 MED ORDER — FUROSEMIDE 10 MG/ML IJ SOLN
40.0000 mg | Freq: Once | INTRAMUSCULAR | Status: AC
  Administered 2015-08-23: 40 mg via INTRAVENOUS
  Filled 2015-08-23: qty 4

## 2015-08-23 MED ORDER — FUROSEMIDE 40 MG PO TABS: 40.0000 mg | ORAL_TABLET | Freq: Two times a day (BID) | ORAL | Status: DC

## 2015-08-23 MED ORDER — METOPROLOL SUCCINATE ER 25 MG PO TB24
25.0000 mg | ORAL_TABLET | Freq: Two times a day (BID) | ORAL | Status: DC
  Administered 2015-08-23: 25 mg via ORAL
  Filled 2015-08-23 (×4): qty 1

## 2015-08-23 NOTE — Clinical Documentation Improvement (Signed)
Internal Medicine  Abnormal Lab/Test Results:    BUN 6 - 20 mg/dL 32 (H) 33 (H) 39 (H) 51 (H) 72 (H) 88 (H) 96 (H)        Creatinine, Ser 0.44 - 1.00 mg/dL 1.83 (H) 1.79 (H) 1.79 (H) 1.93 (H) 2.06 (H) 2.17 (H) 2.29 (H)          Possible Clinical Conditions associated with below indicators     Acute Renal Failure   Acute on Chronic Renal Failure  Acute Renal Failure on Chronic Kidney Disease stg IV   Other Condition  Cannot Clinically Determine  Treatment: treating the anemia   Evaluation /Monitoring : BMET daily    Please exercise your independent, professional judgment when responding. A specific answer is not anticipated or expected.   Thank You,  [place] [phone]

## 2015-08-23 NOTE — Progress Notes (Signed)
Patient ID: April Mueller, female   DOB: [birth year], 70 y.o.   MRN: [id number]    Subjective:  Underwent EGD/Colon 1/13 without source of bleeding. Capsule endoscopy with no source of bleeding.    Started amio 08/20/15 for AF. Remains in AF in 68s. Feels some palpitations, no dyspnea.  Hemoglobin stable after transfusions, creatinine stable.     Scheduled Meds: . sodium chloride   Intravenous Once  . atorvastatin  10 mg Oral Daily  . darbepoetin (ARANESP) injection - NON-DIALYSIS  40 mcg Subcutaneous Q Tue-1800  . furosemide  40 mg Intravenous Once  . [START ON 08/24/2015] furosemide  40 mg Oral BID  . metoprolol succinate  25 mg Oral BID  . senna  2 tablet Oral Daily  . sodium chloride  3 mL Intravenous Q12H   Continuous Infusions:   PRN Meds:.acetaminophen **OR** [DISCONTINUED] acetaminophen, ALPRAZolam, famotidine, ondansetron **OR** [DISCONTINUED] ondansetron (ZOFRAN) IV   Filed Vitals:   08/23/15 0400 08/23/15 0500 08/23/15 0843 08/23/15 1210  BP: 109/61  118/53 102/55  Pulse: 68  66 66  Temp: 98.2 F (36.8 C)  98.4 F (36.9 C) 98.1 F (36.7 C)  TempSrc: Oral  Oral Oral  Resp: 22  24 22   Height:      Weight:  219 lb 12.8 oz (99.7 kg)    SpO2: 100%  100% 100%    Intake/Output Summary (Last 24 hours) at 08/23/15 1412 Last data filed at 08/23/15 1400  Gross per 24 hour  Intake 642.47 ml  Output   1200 ml  Net -557.53 ml    LABS: Basic Metabolic Panel:  Recent Labs  08/22/15 0300 08/23/15 0337  NA 139 137  K 4.9 4.8  CL 111 107  CO2 23 23  GLUCOSE 111* 124*  BUN 33* 32*  CREATININE 1.79* 1.83*  CALCIUM 9.5 9.7   Liver Function Tests: No results for input(s): AST, ALT, ALKPHOS, BILITOT, PROT, ALBUMIN in the last 72 hours. No results for input(s): LIPASE, AMYLASE in the last 72 hours. CBC:  Recent Labs  08/22/15 0300 08/23/15 0337  WBC 11.1* 12.1*  NEUTROABS  --  9.8*  HGB 9.5* 9.7*  HCT 30.7* 31.1*  MCV 95.3 97.2  PLT 145* 140*    Cardiac Enzymes: No results for input(s): CKTOTAL, CKMB, CKMBINDEX, TROPONINI in the last 72 hours. BNP: Invalid input(s): POCBNP D-Dimer: No results for input(s): DDIMER in the last 72 hours. Hemoglobin A1C: No results for input(s): HGBA1C in the last 72 hours. Fasting Lipid Panel: No results for input(s): CHOL, HDL, LDLCALC, TRIG, CHOLHDL, LDLDIRECT in the last 72 hours. Thyroid Function Tests: No results for input(s): TSH, T4TOTAL, T3FREE, THYROIDAB in the last 72 hours.  Invalid input(s): FREET3 Anemia Panel: No results for input(s): VITAMINB12, FOLATE, FERRITIN, TIBC, IRON, RETICCTPCT in the last 72 hours.  RADIOLOGY: Dg Chest 2 View  08/16/2015  CLINICAL DATA:  Weakness, shortness of breath for 5 days EXAM: CHEST  2 VIEW COMPARISON:  12/17/2014 FINDINGS: Cardiomediastinal silhouette is stable. Again noted dilated aortic arch and descending aorta. Status post median sternotomy. Stable right perihilar scarring. Central mild vascular congestion without convincing pulmonary edema. Mild left basilar atelectasis. Mild degenerative changes thoracic spine. No definite superimposed infiltrate or pulmonary edema. IMPRESSION: Cardiomegaly again noted. 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 Status post median sternotomy. Stable scarring right midlung. Central mild vascular congestion without pulmonary edema. Mild left basilar atelectasis. No definite superimposed infiltrate. Electronically Signed   By: Lahoma Crocker  M.D.   On: 08/16/2015 11:38    PHYSICAL EXAM General: NAD Neck: JVP 8-9 cm, no thyromegaly or lymphadenopathy noted.  Lungs: Mildly diminished bases. CV: Nondisplaced PMI.  Heart irregular S1/S2, no S3/S4, 3/6 HSM apex.  1+ ankle edema.   Abdomen: Soft, NT, ND, no HSM. No bruits or masses. +BS  Neurologic: Alert and oriented x 3.  Psych: Normal affect. Extremities: No clubbing or cyanosis.   TELEMETRY:Reviewed personally, Afib 60s  ASSESSMENT  AND PLAN: April Mueller is a 70 yo with COPD, former smoker (28 pk-yrs), obesity, RA , CKD 4, Type A aortic dissection s/p repair 2005 c/b renal failure requiring HD x 3 months, PH - previously treated in [place]. Admitted with profound iron deficiency anemia. EGD/colon 1/13 negative. Pending capsule endo (swallowed 08/21/15, likely to be reviewed today per GI note)  1. 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 This appeared to be primarily pulmonary venous hypertension. Interestingly, there were very prominent V waves in the PCWP tracing. On exam, she has a prominent MR murmur. I was concerned that significant MR may be playing a major role in her symptomatology. I did a TEE in 6/16 showing very eccentric, posteriorly-directed mitral regurgitation possibly from subtle anterior leaflet prolapse. I think that the MR could be severe but is difficult to fully visualize. Her pulmonologist does not think that lung parenchymal disease adequately explains her degree of hypoxemia and dyspnea. She was [facility] at [facility] for consideration of percutaneous MV clip and was not thought to be a good candidate => concern that MV disease may not actually be severe and concern that her dyspnea is coming from something else and would not be improved by MV clipping.She is mildly volume overloaded on exam today. - Will give Lasix 40 mg IV x 1 today then restart Lasix 40 mg po bid tomorrow.  - She is currently off amlodipine and spironolactone with soft BP.  2. Chronic respiratory failure: She is on 4-5 L home oxygen by [place]. I have been concerned that she does have significant underlying lung parenchymal disease. However, her pulmonologist (Dr Melvyn Novas), did not think she had a significant degree of obstructive lung disease on last PFTs done in the last year. He thought that the restriction was primarily  due to her body habitus.  - Continue supplemental oxygen.  3. Atrial fibrillation: Persistent.  This is newly noted this admission.  At risk with significant mitral regurgitation.  HR currently controlled on amiodarone gtt. Not candidate for anticoagulation due to anemia and concern for GI bleeding.  - She remains in atrial fibrillation and amiodarone is probably not a good long-term choice for her with chronic lung disease.  Will stop amiodarone and start Toprol XL 25 mg bid for rate control. - CHADSVASC = 3, ideally would be anticoagulated but not candidate currently.  Possibly Watchman candidate down the road.  If her hemoglobin stays stable at followup, would consider starting DOAC in 2-3 weeks as long as no evidence of bleeding.  4. Anemia: Profound anemia at admission, suspected GI blood loss, thought to be upper with black stool and elevated BUN. However, EGD/colon on 1/13 without clear source. Capsule endoscopy done and showed no definite source for bleeding.  - Hemoglobin back up after repeat transfusion, follow closely.  - She is in atrial fibrillation but would not anticoagulate yet with the anemia.  As no GI bleeding source found,  would consider DOAC after 2-3 weeks if hemoglobin remains stable.  5. OSA: Needs CPAP nightly.  6. Acute on CKD: Stage IV. Creatinine initially elevated, now back to her baseline at 1.83. 7. Aortic dissection s/p repair 2005: Although complete records are not available, it appears that the patient developed a Type A dissection in 2005 with ascending aorta repair (can see the surgically repaired ascending aorta well on TEE). She has residual dissection in the descending thoracic aorta.  8. Pulmonary hypertension: I think that this is primarily WHO group 2 (pulmonary venous hypertension) and group 3 (PH due to intrinsic lung disease) rather than primarily WHO group 1 PAH. RHC 1/16 showed low PVR. She is now off macitentan has felt better off it. She may need  eventual RHC. 9. Mitral regurgitation: Prior TEE concerning for possibly severe, eccentric mitral regurgitation, most likely due to subtle prolapse of the anterior mitral valve leaflet. I have been concerned that the MR is playing a role in her CHF. She had prominent V-waves on RHC 1/16, and she has a mitral area murmur. I do not think that she is a good candidate for open MV surgery. I had her evaluated at [facility] for possible percutaneous mitral clipping. She was turned down for this as her MR was not clearly severe and fixing it may not help her hypoxemia appreciably. Echo this admission appears to show moderate MR.  10. Disposition: Possibly home tomorrow if she remains stable.   [name] 08/23/2015 2:12 PM

## 2015-08-23 NOTE — Progress Notes (Addendum)
PROGRESS NOTE    April Mueller [id number] DOB: 09/08/1945 DOA: 08/16/2015 PCP:  Melinda Crutch, MD  Hematology: Dr. Sullivan Lone. Cardiology: Dr. [name] Rheumatology: Dr. Gavin Pound Pulmonology: Dr. Baltazar Apo  HPI/Brief narrative 70 year old female patient with history of COPD, obesity, rheumatoid arthritis on Plaquenil, type A aortic dissection status post repair 2005, PAH, OSA on nightly CPAP, chronic diastolic CHF, chronic kidney disease IV, severe MR-not a good candidate for MV clip, anemia related to CKD/RA/MDS, chronic respiratory failure on home oxygen admitted to [facility] on 08/16/15 with symptomatic anemia and hemoglobin of 5.2. She has chronic black stools attributed to iron supplements. Patient was transfused 3 units of PRBCs. Baseline hemoglobin set to be in the 9-10 grams per DL range. [facility] consulted. Status post EGD (normal) and colonoscopy (multiple polyps-polypectomy) but no obvious source of bleeding. Capsule endoscopy neg. New onset A. fib with controlled ventricular rate-on IV amiodarone.   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 Acute anemia may have been due to GI blood loss. - Follows with Dr. Irene Limbo, Hematology. Patient was on iron infusions and Epogen. - Anemia panel: Iron 13, TIBC 311, saturation ratio 4, ferritin 17, folate 42, B12: 539. Will provide a dose of IV iron per pharmacy. - She had received 3 units PRBCs earlier in admission. Hemoglobin had improved in the mid 7 g range and then dropped to 7.2 on 1/14. Due to symptomatic anemia, transfused additional 2 units PRBCs on 1/14. Hemoglobin stable in the 9 g range for the last 3 days. - Completed GI workup including EGD, colonoscopy and capsule endoscopy which did not reveal source of bleeding. GI signed off. Do not recommend outpatient GI follow-up. - Informed Dr.  Irene Limbo re hospitalization and course.  FOBT +/possible GI bleed - Completed GI workup including EGD, colonoscopy and capsule endoscopy which did not reveal source of bleeding. GI signed off. Do not recommend outpatient GI follow-up.  Hypotension, on 1/14  - May be related to volume depletion from bowel prep for colonoscopy and anemia. Transfused 2 units of PRBCs. Toprol and amlodipine on hold. Held Lasix. Resolved.   Acute on Stage IV chronic kidney disease - Patient's baseline creatinine may be in the 1.7-1.8 range. Admitted with creatinine of 2.76-may have been secondary to outpatient diuretics. - Creatinine has steadily improved to 1.83 on 1/17. Acute kidney injury resolved.  Chronic diastolic CHF/pulmonary hypertension - Compensated. Lasix and Toprol currently on hold. As per cardiology, plans to resume Lasix 1/17.  Thrombocytopenia - Stable  Essential hypertension - Holding amlodipine and Toprol for now, due to recent hypotension.  Rheumatoid arthritis - Follows with Dr. Gavin Pound as outpatient. Plaquenil held currently.  OSA on CPAP/chronic respiratory failure on home oxygen - Continue CPAP and oxygen supplementation-3 L/m continuously at home.  Severe MR - Not candidate for surgical intervention.  Hyperkalemia - Resolved  New onset A. fib with controlled ventricular rate.  - First noted on telemetry on 1/14. Cardiology follow-up appreciated. Started on amiodarone drip. As per cardiology follow-up, continue IV amiodarone for additional 24 hours and if does not revert to sinus rhythm, stop amiodarone given history of lung disease and uses Toprol-XL. - CHADSVASC = 3, ideally would be anticoagulated but not candidate currently. Possibly [facility] candidate down the road.  - Not anticoagulation candidate secondary to presumed GI bleeding and severe symptomatic anemia on presentation.  - Await cardiology  follow-up to determine further care-? DC IV amiodarone drip and  transition to Toprol-XL and discharge plans.  Colonic polyps - As seen and managed on colonoscopy. Pathology results as below. - Diagnosis Colon, polyp(s), Right ascending - TUBULAR ADENOMA(S). - HIGH GRADE DYSPLASIA IS NOT IDENTIFIED.   DVT prophylaxis: SCDs Code Status: Full Family Communication: None at bedside Disposition Plan: DC home when cleared by cardiology.   Consultants:  [facility]  Cardiology  Procedures:  Nightly CPAP  Colonoscopy 08/19/15:   ENDOSCOPIC IMPRESSION: 1. Two sessile polyps measuring 3 and 10 mm mm in size were found at the cecum; polypectomies were performed with cold snare; hemoclip placement x 1 at site of larger polypectomy 2. Three sessile polyps ranging from 5 to 32mm in size were found in the ascending colon; polypectomies were performed with a cold snare 3. There was severe diverticulosis noted in the sigmoid colon  RECOMMENDATIONS: 1. Await pathology results 2. High fiber diet 3. Follow-up with hematology as an outpatient. Monitor Hgb and iron studies. If anemia progresses or further evidence of GI bleeding, then would recommend referral back to GI for video capsule endoscopy.   EGD 08/19/15: ENDOSCOPIC IMPRESSION: Normal EGD  RECOMMENDATIONS: Proceed with a Colonoscopy.   Capsule endoscopy: Normal small bowel.  Antimicrobials:  None   Subjective: States that she ambulated yesterday with no dizziness or lightheadedness but had chronic unchanged DOE. No BM. Passing flatus. No palpitations reported.  Objective: Filed Vitals:   08/23/15 0400 08/23/15 0500 08/23/15 0843 08/23/15 1210  BP: 109/61  118/53 102/55  Pulse: 68  66 66  Temp: 98.2 F (36.8 C)  98.4 F (36.9 C) 98.1 F (36.7 C)  TempSrc: Oral  Oral Oral  Resp: 22  24 22   Height:      Weight:  99.7 kg (219 lb 12.8 oz)    SpO2: 100%  100% 100%    Intake/Output Summary (Last 24 hours) at 08/23/15 1240 Last data filed at 08/23/15 1226  Gross  per 24 hour  Intake 5710.67 ml  Output   1200 ml  Net 4510.67 ml   Filed Weights   08/21/15 0500 08/22/15 0500 08/23/15 0500  Weight: 96.2 kg (212 lb 1.3 oz) 95 kg (209 lb 7 oz) 99.7 kg (219 lb 12.8 oz)    Exam:  General exam: Pleasant middle-aged female sitting up comfortably in chair this morning. Respiratory system: Clear. No increased work of breathing. Cardiovascular system: S1 & S2 heard, RRR. No JVD, murmurs, gallops, clicks or pedal edema. Telemetry: A. fib with controlled ventricular rate.  Gastrointestinal system: Abdomen is nondistended, soft and nontender. Normal bowel sounds heard. Central nervous system: Alert and oriented. No focal neurological deficits. Extremities: Symmetric 5 x 5 power.   Data Reviewed: Basic Metabolic Panel:  Recent Labs Lab 08/19/15 0310 08/20/15 0300 08/21/15 0403 08/22/15 0300 08/23/15 0337  NA 142 141 138 139 137  K 4.8 4.3 4.2 4.9 4.8  CL 112* 111 109 111 107  CO2 23 21* 21* 23 23  GLUCOSE 110* 128* 124* 111* 124*  BUN 72* 51* 39* 33* 32*  CREATININE 2.06* 1.93* 1.79* 1.79* 1.83*  CALCIUM 9.6 9.5 9.5 9.5 9.7   Liver Function Tests: No results for input(s): AST, ALT, ALKPHOS, BILITOT, PROT, ALBUMIN in the last 168 hours. No results for input(s): LIPASE, AMYLASE in the last 168 hours. No results for input(s): AMMONIA in the last 168 hours. CBC:  Recent Labs Lab 08/19/15 0310 08/20/15 0300 08/21/15 0403 08/22/15 0300 08/23/15 [id number]  WBC 12.4* 11.5* 11.3* 11.1* 12.1*  NEUTROABS  --   --   --   --  9.8*  HGB 7.7* 7.2* 9.2* 9.5* 9.7*  HCT 24.9* 23.3* 29.3* 30.7* 31.1*  MCV 94.7 96.7 93.3 95.3 97.2  PLT 134* 132* 136* 145* 140*   Cardiac Enzymes: No results for input(s): CKTOTAL, CKMB, CKMBINDEX, TROPONINI in the last 168 hours. BNP (last 3 results)  Recent Labs  12/17/14 1612  PROBNP 447.0*   CBG:  Recent Labs Lab 08/16/15 1540  GLUCAP 121*    Recent Results (from the past 240 hour(s))  MRSA PCR Screening      Status: None   Collection Time: 08/16/15  7:19 PM  Result Value Ref Range Status   MRSA by PCR NEGATIVE NEGATIVE Final    Comment:        The GeneXpert MRSA Assay (FDA approved for NASAL specimens only), is one component of a comprehensive MRSA colonization surveillance program. It is not intended to diagnose MRSA infection nor to guide or monitor treatment for MRSA infections.          Studies: No results found.      Scheduled Meds: . sodium chloride   Intravenous Once  . atorvastatin  10 mg Oral Daily  . darbepoetin (ARANESP) injection - NON-DIALYSIS  40 mcg Subcutaneous Q Tue-1800  . senna  2 tablet Oral Daily  . sodium chloride  3 mL Intravenous Q12H   Continuous Infusions: . amiodarone 30 mg/hr (08/23/15 0222)    Principal Problem:   Symptomatic anemia Active Problems:   Secondary pulmonary hypertension (HCC)   Rheumatoid arthritis (HCC)   OSA on CPAP   Chronic diastolic heart failure (HCC)   Chronic respiratory failure with hypoxia (HCC)   Mitral regurgitation   Normocytic anemia   CKD (chronic kidney disease) stage 3, GFR 30-59 ml/min   HTN (hypertension)   Leukocytosis   Acute hyperkalemia   Myelodysplastic syndrome (HCC)   Anemia   Heme positive stool   History of colonic polyps   Benign neoplasm of cecum   Benign neoplasm of ascending colon   PAF (paroxysmal atrial fibrillation) ([place])    Time spent: 15 minutes.    Vernell Leep, MD, FACP, FHM. [facility] Pager [phone]  If 7PM-7AM, please contact night-coverage [email or web address] Password [email or web address] 08/23/2015, 12:40 PM    LOS: 7 days

## 2015-08-23 NOTE — Evaluation (Signed)
Physical Therapy Evaluation Patient Details Name: April Mueller MRN: [id number] DOB: 08-21-1945 Today's Date: 08/23/2015   History of Present Illness  Patient is a 70 y/o female with hx of HTN, CKD, pulmonary HTN, aortic aneurysm, CHF, gout and RA presents with symptomatic anemia. Noted to have New onset A. fib with controlled ventricular rate.  Clinical Impression  Patient presents with decreased strength, endurance, respiratory status and dyspnea on exertion impacting safe mobility. Pt with 3/4 DOE. Pt lives alone and is Mod I PTA. Encouraged daily mobility with RN to improve strength/mobility. Sp02 decreased to 85% on 3L and HR up to 132 bpm. Will follow acutely to maximize independence and mobility prior to return home.    Follow Up Recommendations Supervision - Intermittent;Home health PT    Equipment Recommendations  None recommended by PT    Recommendations for Other Services OT consult     Precautions / Restrictions Precautions Precautions: Fall Precaution Comments: monitor 02 Restrictions Weight Bearing Restrictions: No      Mobility  Bed Mobility               General bed mobility comments: Sitting in chair upon PT arrival.   Transfers Overall transfer level: Needs assistance Equipment used: Rolling walker (2 wheeled) Transfers: Sit to/from Stand Sit to Stand: Supervision         General transfer comment: Supervision for safety. No dizziness.   Ambulation/Gait Ambulation/Gait assistance: Min guard Ambulation Distance (Feet): 150 Feet Assistive device: Rolling walker (2 wheeled) Gait Pattern/deviations: Step-through pattern;Decreased stride length;Trunk flexed   Gait velocity interpretation: <1.8 ft/sec, indicative of risk for recurrent falls General Gait Details: Cues for RW management/proximity. 3/4 DOE. Sp02 dropped to 85% on 3L/min 02 and HR up to 132 A-fib. Mostly steady gait.   Stairs            Wheelchair Mobility    Modified  [place] (Stroke Patients Only)       Balance Overall balance assessment: Needs assistance Sitting-balance support: Feet supported;No upper extremity supported Sitting balance-Leahy Scale: Good     Standing balance support: During functional activity Standing balance-Leahy Scale: Poor Standing balance comment: Reliant on RW for support.                              Pertinent Vitals/Pain Pain Assessment: No/denies pain    Home Living Family/patient expects to be discharged to:: Private residence Living Arrangements: Alone   Type of Home: Apartment Home Access: Ramped entrance     Home Layout: One level Home Equipment: [place] - 4 wheels;Shower seat      Prior Function Level of Independence: Independent with assistive device(s)         Comments: Pt uses rollator for community ambulation and independent for household. Uses scat for transportation. Closest family is in [place]- her son. Wears 3-5 L/min 02 at home     Hand Dominance        Extremity/Trunk Assessment   Upper Extremity Assessment: Defer to OT evaluation           Lower Extremity Assessment: Generalized weakness         Communication   Communication: No difficulties  Cognition Arousal/Alertness: Awake/alert Behavior During Therapy: WFL for tasks assessed/performed Overall Cognitive Status: Within Functional Limits for tasks assessed                      General Comments General comments (skin integrity, edema,  etc.): pt very adamant about wanting to maintain independence for as long as she can. She does not have any supportive family in area- her son lives in [place].     Exercises        Assessment/Plan    PT Assessment Patient needs continued PT services  PT Diagnosis Difficulty walking   PT Problem List Decreased strength;Cardiopulmonary status limiting activity;Decreased activity tolerance;Decreased balance;Decreased mobility  PT Treatment Interventions Balance  training;Gait training;Functional mobility training;Therapeutic activities;Therapeutic exercise;Patient/family education   PT Goals (Current goals can be found in the Care Plan section) Acute Rehab PT Goals Patient Stated Goal: to stay as independent as I can PT Goal Formulation: With patient Time For Goal Achievement: 09/06/15 Potential to Achieve Goals: Good    Frequency Min 3X/week   Barriers to discharge Decreased caregiver support Lives alone    Co-evaluation               End of Session Equipment Utilized During Treatment: Gait belt;Oxygen Activity Tolerance: Patient tolerated treatment well;Treatment limited secondary to medical complications (Comment) (HR up to 132 in A-fib) Patient left: in chair;with call bell/phone within reach Nurse Communication: Mobility status;Other (comment) (needing to walk a few times per day)         Time: [id number] PT Time Calculation (min) (ACUTE ONLY): 26 min   Charges:   PT Evaluation $PT Eval Moderate Complexity: 1 Procedure PT Treatments $Gait Training: 8-22 mins   PT G Codes:        Dynver [place]on A Hye Trawick 08/23/2015, 10:08 AM Wray Kearns, PT, DPT [phone]

## 2015-08-24 ENCOUNTER — Encounter (HOSPITAL_COMMUNITY): Payer: Medicare Other

## 2015-08-24 LAB — BASIC METABOLIC PANEL
ANION GAP: 6 (ref 5–15)
BUN: 33 mg/dL — ABNORMAL HIGH (ref 6–20)
CALCIUM: 10 mg/dL (ref 8.9–10.3)
CHLORIDE: 110 mmol/L (ref 101–111)
CO2: 23 mmol/L (ref 22–32)
CREATININE: 1.98 mg/dL — AB (ref 0.44–1.00)
GFR calc non Af Amer: 25 mL/min — ABNORMAL LOW (ref >60)
GFR, EST AFRICAN AMERICAN: 29 mL/min — AB (ref >60)
Glucose, Bld: 95 mg/dL (ref 65–99)
Potassium: 5.1 mmol/L (ref 3.5–5.1)
SODIUM: 139 mmol/L (ref 135–145)

## 2015-08-24 LAB — CBC
HCT: 30.2 % — ABNORMAL LOW (ref 36.0–46.0)
HEMOGLOBIN: 9.4 g/dL — AB (ref 12.0–15.0)
MCH: 30.5 pg (ref 26.0–34.0)
MCHC: 31.1 g/dL (ref 30.0–36.0)
MCV: 98.1 fL (ref 78.0–100.0)
PLATELETS: 135 10*3/uL — AB (ref 150–400)
RBC: 3.08 MIL/uL — AB (ref 3.87–5.11)
RDW: 18.6 % — ABNORMAL HIGH (ref 11.5–15.5)
WBC: 13 10*3/uL — AB (ref 4.0–10.5)

## 2015-08-24 LAB — BRAIN NATRIURETIC PEPTIDE: B NATRIURETIC PEPTIDE 5: 148.4 pg/mL — AB (ref 0.0–100.0)

## 2015-08-24 MED ORDER — BISACODYL 5 MG PO TBEC: 10.0000 mg | DELAYED_RELEASE_TABLET | Freq: Once | ORAL | Status: DC

## 2015-08-24 MED ORDER — SENNOSIDES-DOCUSATE SODIUM 8.6-50 MG PO TABS
2.0000 | ORAL_TABLET | Freq: Two times a day (BID) | ORAL | Status: DC
  Filled 2015-08-24 (×2): qty 2

## 2015-08-24 MED ORDER — POLYETHYLENE GLYCOL 3350 17 G PO PACK: 34.0000 g | PACK | Freq: Once | ORAL | Status: DC

## 2015-08-24 MED ORDER — METOPROLOL SUCCINATE ER 25 MG PO TB24
25.0000 mg | ORAL_TABLET | Freq: Two times a day (BID) | ORAL | Status: DC
  Administered 2015-08-25: 25 mg via ORAL
  Filled 2015-08-24 (×4): qty 1

## 2015-08-24 NOTE — Progress Notes (Signed)
NURSING PROGRESS NOTE  April Mueller [id number] Transfer Data: 08/24/2015 4:16 PM Attending Provider: Albertine Patricia, MD PCP: Melinda Crutch, MD Code Status: Full   April Mueller is a 70 y.o. female patient transferred from 2 C  -No acute distress noted.  -No complaints of shortness of breath.  -No complaints of chest pain.   Cardiac Monitoring: Box # 09 in place. Cardiac monitor yields:normal sinus rhythm.  Last Documented Vital Signs: Blood pressure 92/66, pulse 65, temperature 99.2 F (37.3 C), temperature source Oral, resp. rate 24, height 5\' 4"  (1.626 m), weight 97.977 kg (216 lb), SpO2 100 %.  IV Fluids:  IV in place, occlusive dsg intact without redness, IV cath wrist left, condition patent and no redness none.   Allergies:  Review of patient's allergies indicates no known allergies.  Past Medical History:   has a past medical history of Hypertension; Pulmonary HTN ([place]); Aortic aneurysm ([place]); Sleep apnea; CKD (chronic kidney disease) stage 3, GFR 30-59 ml/min; Anemia (03/23/15); Arthritis; CHF (congestive heart failure) ([place]); Gout; and Rheumatoid arthritis(714.0) (11/06/2012).  Past Surgical History:   has past surgical history that includes Abdominal aortic aneurysm repair; right heart catheterization (N/A, 08/18/2014); TEE without cardioversion (N/A, 01/05/2015); Wisdom tooth extraction; Givens capsule study (N/A, 08/21/2015); Esophagogastroduodenoscopy (N/A, 08/19/2015); and Colonoscopy (N/A, 08/19/2015).  Social History:   reports that she quit smoking about 12 years ago. Her smoking use included Cigarettes. She has a 21 pack-year smoking history. She has never used smokeless tobacco. She reports that she does not drink alcohol or use illicit drugs.  Skin: intact except where otherwise charted.  Patient/Family orientated to room. Information packet given to patient/family. Admission inpatient armband information verified with patient/family to include name and date of birth  and placed on patient arm. Side rails up x 2, fall assessment and education completed with patient/family. Patient/family able to verbalize understanding of risk associated with falls and verbalized understanding to call for assistance before getting out of bed. Call light within reach. Patient/family able to voice and demonstrate understanding of unit orientation instructions.

## 2015-08-24 NOTE — Progress Notes (Signed)
Patient ID: April Mueller, female   DOB: Jan 21, 1946, 70 y.o.   MRN: [id number]    Subjective:  Underwent EGD/Colon 1/13 without source of bleeding. Capsule endoscopy without bleeding source.  She has had total 5 units PRBCs but hemoglobin now stable.  Started amio 08/20/15 for AF. Remains in AF in 46s. Remained in A fib so amio was stopped and Toprol XL was started.   Ongoing dyspnea with exertion but this is her baseline. Overall feeling ok.       Scheduled Meds: . sodium chloride   Intravenous Once  . atorvastatin  10 mg Oral Daily  . darbepoetin (ARANESP) injection - NON-DIALYSIS  40 mcg Subcutaneous Q Tue-1800  . furosemide  40 mg Oral BID  . metoprolol succinate  25 mg Oral BID  . senna  2 tablet Oral Daily  . sodium chloride  3 mL Intravenous Q12H   Continuous Infusions:   PRN Meds:.acetaminophen **OR** [DISCONTINUED] acetaminophen, ALPRAZolam, famotidine, ondansetron **OR** [DISCONTINUED] ondansetron (ZOFRAN) IV   Filed Vitals:   08/23/15 2320 08/24/15 0412 08/24/15 0500 08/24/15 0750  BP: 105/65 105/52  110/61  Pulse: 56 60  59  Temp: 99.1 F (37.3 C) 98.5 F (36.9 C)  98.2 F (36.8 C)  TempSrc: Oral Oral  Oral  Resp: 26 20  26   Height:      Weight:   221 lb 9 oz (100.5 kg)   SpO2: 100% 100%  100%    Intake/Output Summary (Last 24 hours) at 08/24/15 0805 Last data filed at 08/24/15 0414  Gross per 24 hour  Intake 1113.3 ml  Output   1900 ml  Net -786.7 ml    LABS: Basic Metabolic Panel:  Recent Labs  08/23/15 0337 08/24/15 0326  NA 137 139  K 4.8 5.1  CL 107 110  CO2 23 23  GLUCOSE 124* 95  BUN 32* 33*  CREATININE 1.83* 1.98*  CALCIUM 9.7 10.0   Liver Function Tests: No results for input(s): AST, ALT, ALKPHOS, BILITOT, PROT, ALBUMIN in the last 72 hours. No results for input(s): LIPASE, AMYLASE in the last 72 hours. CBC:  Recent Labs  08/23/15 0337 08/24/15 0326  WBC 12.1* 13.0*  NEUTROABS 9.8*  --   HGB 9.7* 9.4*  HCT 31.1*  30.2*  MCV 97.2 98.1  PLT 140* 135*   Cardiac Enzymes: No results for input(s): CKTOTAL, CKMB, CKMBINDEX, TROPONINI in the last 72 hours. BNP: Invalid input(s): POCBNP D-Dimer: No results for input(s): DDIMER in the last 72 hours. Hemoglobin A1C: No results for input(s): HGBA1C in the last 72 hours. Fasting Lipid Panel: No results for input(s): CHOL, HDL, LDLCALC, TRIG, CHOLHDL, LDLDIRECT in the last 72 hours. Thyroid Function Tests: No results for input(s): TSH, T4TOTAL, T3FREE, THYROIDAB in the last 72 hours.  Invalid input(s): FREET3 Anemia Panel: No results for input(s): VITAMINB12, FOLATE, FERRITIN, TIBC, IRON, RETICCTPCT in the last 72 hours.  RADIOLOGY: Dg Chest 2 View  08/16/2015  CLINICAL DATA:  Weakness, shortness of breath for 5 days EXAM: CHEST  2 VIEW COMPARISON:  12/17/2014 FINDINGS: Cardiomediastinal silhouette is stable. Again noted dilated aortic arch and descending aorta. Status post median sternotomy. Stable right perihilar scarring. Central mild vascular congestion without convincing pulmonary edema. Mild left basilar atelectasis. Mild degenerative changes thoracic spine. No definite superimposed infiltrate or pulmonary edema. IMPRESSION: Cardiomegaly again noted. 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 Status post median sternotomy. Stable scarring right midlung. Central mild vascular congestion without pulmonary edema. Mild left  basilar atelectasis. No definite superimposed infiltrate. Electronically Signed   By: April Mueller M.D.   On: 08/16/2015 11:38    PHYSICAL EXAM General: NAD. Sitting in the chair.  Neck: JVP flat, no thyromegaly or lymphadenopathy noted.  Lungs: Mildly diminished bases. CV: Nondisplaced PMI.  Heart irregular S1/S2, no S3/S4, 2/6 HSM apex.  No peripheral edema.   Abdomen: Soft, NT, ND, no HSM. No bruits or masses. +BS  Neurologic: Alert and oriented x 3.  Psych: Normal affect. Extremities: No clubbing or  cyanosis.   TELEMETRY:Reviewed personally, Afib 50-60s  ASSESSMENT AND PLAN: April Mueller is a 70 yo with COPD, former smoker (28 pk-yrs), obesity, RA , CKD 4, Type A aortic dissection s/p repair 2005 c/b renal failure requiring HD x 3 months, PH - previously treated in [place]. Admitted with profound iron deficiency anemia. EGD/colon 1/13 negative. Pending capsule endo (swallowed 08/21/15, likely to be reviewed today per GI note)  1. 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 This appeared to be primarily pulmonary venous hypertension. Interestingly, there were very prominent V waves in the PCWP tracing. On exam, she has a prominent MR murmur. Concern that  significant MR may be playing a major role in her symptomatology. She had  a TEE in 6/16 showing very eccentric, posteriorly-directed mitral regurgitation possibly from subtle anterior leaflet prolapse. MR could be severe but is difficult to fully visualize. Her pulmonologist does not think that lung parenchymal disease adequately explains her degree of hypoxemia and dyspnea. She was seen at [facility] for consideration of percutaneous MV clip and was not thought to be a good candidate => concern that MV disease may not actually be severe and concern that her dyspnea is coming from something else and would not be improved by MV clipping.Volume status looks ok today.  - Renal function ok. Creatinine baseline 2-2.5.   Restarted lasix 40 mg twice a day.    - She is currently off amlodipine and spironolactone with soft BP.  I think that she should stay off these when she goes home.  2. Chronic respiratory failure: She is on 4-5 L home oxygen by [facility]. Concern that  does have significant underlying lung parenchymal disease. However, her pulmonologist (Dr April Mueller), did not think she had a significant degree of obstructive lung  disease on last PFTs done in the last year. He thought that the restriction was primarily due to her body habitus.  - Continue supplemental oxygen.  3. Atrial fibrillation: Persistent.  This is newly noted this admission.  At risk with significant mitral regurgitation.  Not candidate for anticoagulation currently due to bleeding.  - Now on Toprol XL 25 mg bid for rate control, HR in 60s.  - CHADSVASC = 3, ideally would be anticoagulated but not candidate currently.  Hold off for a few weeks and start as an outpatient. Possibly Watchman candidate down the road.  4. Anemia: Profound anemia at admission, suspect GI blood loss, probably upper with black stool and elevated BUN.  - EGD/colon on 1/13 without clear source. Capsule endoscopy negative.   - Hemoglobin today 9.4 -Will need to hold off starting anticoagulants. Will reassess this at followup appointment.   5. OSA: Needs CPAP nightly.  6. Acute on CKD: Stage IV. Creatinine initially elevated, now back to her baseline at 1.79-->1.98 7. Aortic dissection s/p repair 2005: Although complete records are not available, it appears that the  patient developed a Type A dissection in 2005 with ascending aorta repair (can see the surgically repaired ascending aorta well on TEE). She has residual dissection in the descending thoracic aorta.  8. 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 RHC 1/16 showed low PVR. She is now off macitentan has felt better off it. She may need eventual RHC. 9. Mitral regurgitation: Prior TEE concerning for possibly severe, eccentric mitral regurgitation, most likely due to subtle prolapse of the anterior mitral valve leaflet. I have been concerned that the MR is playing a role in her CHF. She had prominent V-waves on RHC 1/16, and she has a mitral area murmur. Not thought to be be a  good candidate  for open MV surgery. She was evaluated at [facility] for possible percutaneous mitral clipping. She was turned down for this as her MR was not clearly severe and fixing it may not help her hypoxemia appreciably. Echo this admission appears to show moderate MR.   We will schedule HF follow up.   Amy Clegg NP-C  08/24/2015 8:05 AM   Patient seen with NP, agree with the above note.  Hemoglobin and creatinine stable.  She is back on her home Lasix and is now on Toprol XL 25 mg bid for rate control.  HR reasonable controlled, not on telemetry currently and HR more regular than before, will put back on telemetry to see if she is in NSR.  No anticoagulation for atrial fibrillation at this time given suspected GI bleeding (though no source found with extensive evaluation).  Will see her back in the office in a couple of weeks and readdress anticoagulation (?DOAC if hemoglobin is stable). Watchman would also be a consideration.    BP not elevated, can stay off amlodipine and spironolactone.   Think she could probably go home tomorrow.   [name] 08/24/2015 5:31 PM

## 2015-08-24 NOTE — Progress Notes (Signed)
Pt BP was 104/42 HR 53. Metoprolol not given.

## 2015-08-24 NOTE — Progress Notes (Signed)
Report received from RN on 2 C. Pt to be transferred to [place] room 23.

## 2015-08-24 NOTE — Progress Notes (Signed)
PROGRESS NOTE    April Mueller [id number] DOB: 13-Nov-1945 DOA: 08/16/2015 PCP:  Melinda Crutch, MD  Hematology: Dr. Sullivan Lone. Cardiology: Dr. [name] Rheumatology: Dr. Gavin Pound Pulmonology: Dr. Baltazar Apo  HPI/Brief narrative 70 year old female patient with history of COPD, obesity, rheumatoid arthritis on Plaquenil, type A aortic dissection status post repair 2005, PAH, OSA on nightly CPAP, chronic diastolic CHF, chronic kidney disease IV, severe MR-not a good candidate for MV clip, anemia related to CKD/RA/MDS, chronic respiratory failure on home oxygen admitted to [facility] on 08/16/15 with symptomatic anemia and hemoglobin of 5.2. She has chronic black stools attributed to iron supplements. Patient was transfused 3 units of PRBCs. Baseline hemoglobin set to be in the 9-10 grams per DL range. [facility] consulted. Status post EGD (normal) and colonoscopy (multiple polyps-polypectomy) but no obvious source of bleeding. Capsule endoscopy neg. New onset A. fib with controlled ventricular rate-on IV amiodarone initially, currently transitioned to beta blockers.   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 Acute anemia may have been due to GI blood loss. - Follows with Dr. Irene Limbo, Hematology. Patient was on iron infusions and Epogen. - Anemia panel: Iron 13, TIBC 311, saturation ratio 4, ferritin 17, folate 42, B12: 539. Will provide a dose of IV iron per pharmacy. - She had received 3 units PRBCs earlier in admission. Hemoglobin had improved in the mid 7 g range and then dropped to 7.2 on 1/14. Due to symptomatic anemia, transfused additional 2 units PRBCs on 1/14. Hemoglobin stable in the 9 g range for the last 3 days. - Completed GI workup including EGD, colonoscopy and capsule endoscopy which did not reveal source of bleeding. GI signed off. Do not  recommend outpatient GI follow-up. - Dr Algis Liming informed Dr. Irene Limbo re hospitalization and course.  FOBT +/possible GI bleed - Completed GI workup including EGD, colonoscopy and capsule endoscopy which did not reveal source of bleeding. GI signed off. Do not recommend outpatient GI follow-up.  Hypotension, on 1/14  - May be related to volume depletion from bowel prep for colonoscopy and anemia. Transfused 2 units of PRBCs. - Patient's blood pressure remains on the lower side, amlodipine and Aldactone stopped by cardiology.patient in the setting of potassium 5.1  Acute on Stage IV chronic kidney disease - Patient's baseline creatinine may be in the 2.2 range. Admitted with creatinine of 2.76-may have been secondary to outpatient diuretics. - Monitor as patient is on diuresis  Chronic diastolic CHF/pulmonary hypertension - Compensated plan per cardiology, resumed on Lasix and beta blockers .  Thrombocytopenia - Stable  Essential hypertension - Holding amlodipine  for now, due to soft blood pressure.  Rheumatoid arthritis - Follows with Dr. Gavin Pound as outpatient. Plaquenil held currently.  OSA on CPAP/chronic respiratory failure on home oxygen - Continue CPAP and oxygen supplementation-3 L/m continuously at home.  Severe MR - Not candidate for surgical intervention.  Hyperkalemia - Resolved, potassium is 5.1, Aldactone on hold.  New onset A. fib with controlled ventricular rate.  - First noted on telemetry on 1/14. Cardiology follow-up appreciated. Started on amiodarone drip. As per cardiology follow-up, continue IV amiodarone for additional 24 hours and if does not revert to sinus rhythm, stop amiodarone given history of lung disease and uses Toprol-XL. - CHADSVASC = 3, ideally would be anticoagulated but not candidate currently. Possibly Watchman candidate down the road.  - Not anticoagulation  candidate secondary to presumed GI bleeding and severe symptomatic anemia on  presentation.  -  IV amiodarone drip and positioned to Toprol-XL per cardiology , currently heart rate controlled .  Colonic polyps - As seen and managed on colonoscopy. Pathology results as below.  - Diagnosis Colon, polyp(s), Right ascending - TUBULAR ADENOMA(S). - HIGH GRADE DYSPLASIA IS NOT IDENTIFIED.  Constipation - Start on bowel regimen  DVT prophylaxis: SCDs Code Status: Full Family Communication: None at bedside Disposition Plan: DC home when cleared by cardiology.   Consultants:  [facility]  Cardiology  Procedures:  Nightly CPAP  Colonoscopy 08/19/15:   ENDOSCOPIC IMPRESSION: 1. Two sessile polyps measuring 3 and 10 mm mm in size were found at the cecum; polypectomies were performed with cold snare; hemoclip placement x 1 at site of larger polypectomy 2. Three sessile polyps ranging from 5 to 74mm in size were found in the ascending colon; polypectomies were performed with a cold snare 3. There was severe diverticulosis noted in the sigmoid colon  RECOMMENDATIONS: 1. Await pathology results 2. High fiber diet 3. Follow-up with hematology as an outpatient. Monitor Hgb and iron studies. If anemia progresses or further evidence of GI bleeding, then would recommend referral back to GI for video capsule endoscopy.   EGD 08/19/15: ENDOSCOPIC IMPRESSION: Normal EGD  RECOMMENDATIONS: Proceed with a Colonoscopy.   Capsule endoscopy: Normal small bowel.  Antimicrobials:  None   Subjective: Reports chronic unchanged DOE. Tell with with Still no BM. Passing flatus. No palpitations reported.  Objective: Filed Vitals:   08/24/15 0412 08/24/15 0500 08/24/15 0750 08/24/15 0900  BP: 105/52  110/61   Pulse: 60  59   Temp: 98.5 F (36.9 C)  98.2 F (36.8 C)   TempSrc: Oral  Oral   Resp: 20  26   Height:      Weight:  100.5 kg (221 lb 9 oz)  97.977 kg (216 lb)  SpO2: 100%  100%     Intake/Output Summary (Last 24 hours) at 08/24/15  1047 Last data filed at 08/24/15 0929  Gross per 24 hour  Intake 1046.5 ml  Output   2450 ml  Net -1403.5 ml   Filed Weights   08/23/15 0500 08/24/15 0500 08/24/15 0900  Weight: 99.7 kg (219 lb 12.8 oz) 100.5 kg (221 lb 9 oz) 97.977 kg (216 lb)    Exam:  General exam: Pleasant middle-aged female sitting up comfortably in chair this morning. Respiratory system: Clear. No increased work of breathing. Cardiovascular system: S1 & S2 heard, RRR. No JVD, murmurs, gallops, clicks or pedal edema. Telemetry: A. fib with controlled ventricular rate.  Gastrointestinal system: Abdomen is nondistended, soft and nontender. Normal bowel sounds heard. Central nervous system: Alert and oriented. No focal neurological deficits. Extremities: Symmetric 5 x 5 power.   Data Reviewed: Basic Metabolic Panel:  Recent Labs Lab 08/20/15 0300 08/21/15 0403 08/22/15 0300 08/23/15 0337 08/24/15 0326  NA 141 138 139 137 139  K 4.3 4.2 4.9 4.8 5.1  CL 111 109 111 107 110  CO2 21* 21* 23 23 23   GLUCOSE 128* 124* 111* 124* 95  BUN 51* 39* 33* 32* 33*  CREATININE 1.93* 1.79* 1.79* 1.83* 1.98*  CALCIUM 9.5 9.5 9.5 9.7 10.0   Liver Function Tests: No results for input(s): AST, ALT, ALKPHOS, BILITOT, PROT, ALBUMIN in the last 168 hours. No results for input(s): LIPASE, AMYLASE in the last 168 hours. No results for input(s): AMMONIA in the last 168 hours. CBC:  Recent  Labs Lab 08/20/15 0300 08/21/15 0403 08/22/15 0300 08/23/15 0337 08/24/15 0326  WBC 11.5* 11.3* 11.1* 12.1* 13.0*  NEUTROABS  --   --   --  9.8*  --   HGB 7.2* 9.2* 9.5* 9.7* 9.4*  HCT 23.3* 29.3* 30.7* 31.1* 30.2*  MCV 96.7 93.3 95.3 97.2 98.1  PLT 132* 136* 145* 140* 135*   Cardiac Enzymes: No results for input(s): CKTOTAL, CKMB, CKMBINDEX, TROPONINI in the last 168 hours. BNP (last 3 results)  Recent Labs  12/17/14 1612  PROBNP 447.0*   CBG: No results for input(s): GLUCAP in the last 168 hours.  Recent Results  (from the past 240 hour(s))  MRSA PCR Screening     Status: None   Collection Time: 08/16/15  7:19 PM  Result Value Ref Range Status   MRSA by PCR NEGATIVE NEGATIVE Final    Comment:        The GeneXpert MRSA Assay (FDA approved for NASAL specimens only), is one component of a comprehensive MRSA colonization surveillance program. It is not intended to diagnose MRSA infection nor to guide or monitor treatment for MRSA infections.          Studies: No results found.      Scheduled Meds: . sodium chloride   Intravenous Once  . atorvastatin  10 mg Oral Daily  . bisacodyl  10 mg Oral Once  . darbepoetin (ARANESP) injection - NON-DIALYSIS  40 mcg Subcutaneous Q Tue-1800  . furosemide  40 mg Oral BID  . metoprolol succinate  25 mg Oral BID  . polyethylene glycol  34 g Oral Once  . senna-docusate  2 tablet Oral BID  . sodium chloride  3 mL Intravenous Q12H   Continuous Infusions:    Principal Problem:   Symptomatic anemia Active Problems:   Secondary pulmonary hypertension (HCC)   Rheumatoid arthritis (HCC)   OSA on CPAP   Chronic diastolic heart failure (HCC)   Chronic respiratory failure with hypoxia (HCC)   Mitral regurgitation   Normocytic anemia   CKD (chronic kidney disease) stage 3, GFR 30-59 ml/min   HTN (hypertension)   Leukocytosis   Acute hyperkalemia   Myelodysplastic syndrome (HCC)   Anemia   Heme positive stool   History of colonic polyps   Benign neoplasm of cecum   Benign neoplasm of ascending colon   PAF (paroxysmal atrial fibrillation) (HCC)   Acute on chronic diastolic CHF (congestive heart failure) ([place])    Time spent: 25 minutes.    Phillips Climes, MD. [facility] Pager [phone]  If 7PM-7AM, please contact night-coverage [email or web address] Password [email or web address] 08/24/2015, 10:47 AM    LOS: 8 days

## 2015-08-24 NOTE — Care Management Note (Signed)
Case Management Note  Patient Details  Name: RALIYAH TAVAKOLI MRN: [id number] Date of Birth: [birth year]  Subjective/Objective:   Pt lives alone, has walker, shower chair, home O2 through [facility].  PT recommends home therapy and pt agrees, states she also needs assistance with bathing/dressing, etc.  Would like OT eval @ home and aide to assist with bathing until she regains her strength.  Provided a list of home care agencies, referral made to [facility] per choice.                             Expected Discharge Plan:  [place]  Discharge planning Services  CM Consult  Post Acute Care Choice:  Home Health Choice offered to:  Patient  HH Arranged:  PT, OT, Nurse's Aide [place] Agency:  [facility]  Status of Service:  In process, will continue to follow  Medicare Important Message Given:  Yes  Girard Cooter, RN 08/24/2015, 11:41 AM

## 2015-08-24 NOTE — Progress Notes (Signed)
[facility]  Patient Status: New  [facility] is providing the following services: RN, PT, OT and HHA  Was referred for services but patient refuses home health services due to insurance co-pay and feels that services will not be helpful for her.  If patient discharges after hours, please call [phone].   April Mueller 08/24/2015, 3:42 PM

## 2015-08-25 LAB — BASIC METABOLIC PANEL
Anion gap: 4 — ABNORMAL LOW (ref 5–15)
BUN: 36 mg/dL — ABNORMAL HIGH (ref 6–20)
CALCIUM: 9.7 mg/dL (ref 8.9–10.3)
CHLORIDE: 110 mmol/L (ref 101–111)
CO2: 24 mmol/L (ref 22–32)
CREATININE: 1.84 mg/dL — AB (ref 0.44–1.00)
GFR calc non Af Amer: 27 mL/min — ABNORMAL LOW (ref >60)
GFR, EST AFRICAN AMERICAN: 32 mL/min — AB (ref >60)
GLUCOSE: 94 mg/dL (ref 65–99)
Potassium: 4.8 mmol/L (ref 3.5–5.1)
Sodium: 138 mmol/L (ref 135–145)

## 2015-08-25 LAB — CBC
HCT: 29.7 % — ABNORMAL LOW (ref 36.0–46.0)
Hemoglobin: 9.2 g/dL — ABNORMAL LOW (ref 12.0–15.0)
MCH: 30.3 pg (ref 26.0–34.0)
MCHC: 31 g/dL (ref 30.0–36.0)
MCV: 97.7 fL (ref 78.0–100.0)
PLATELETS: 136 10*3/uL — AB (ref 150–400)
RBC: 3.04 MIL/uL — AB (ref 3.87–5.11)
RDW: 18.3 % — ABNORMAL HIGH (ref 11.5–15.5)
WBC: 9.6 10*3/uL (ref 4.0–10.5)

## 2015-08-25 MED ORDER — METOPROLOL SUCCINATE ER 25 MG PO TB24: 25.0000 mg | ORAL_TABLET | Freq: Two times a day (BID) | ORAL | Status: DC

## 2015-08-25 MED ORDER — SENNOSIDES-DOCUSATE SODIUM 8.6-50 MG PO TABS: 2.0000 | ORAL_TABLET | Freq: Two times a day (BID) | ORAL | Status: DC

## 2015-08-25 MED ORDER — ACETAMINOPHEN 325 MG PO TABS: 650.0000 mg | ORAL_TABLET | Freq: Four times a day (QID) | ORAL | Status: DC | PRN

## 2015-08-25 NOTE — Progress Notes (Signed)
Nsg Discharge Note  Admit Date:  08/16/2015 Discharge date: 08/25/2015   Sheldon Silvan to be D/C'd home per MD order.  AVS completed.  Copy for chart, and copy for patient signed, and dated. Patient able to verbalize understanding.  Discharge Medication:   Medication List    STOP taking these medications        amLODipine 5 MG tablet  Commonly known as:  NORVASC     aspirin EC 81 MG tablet     Potassium Chloride ER 20 MEQ Tbcr     spironolactone 25 MG tablet  Commonly known as:  ALDACTONE      TAKE these medications        acetaminophen 325 MG tablet  Commonly known as:  TYLENOL  Take 2 tablets (650 mg total) by mouth every 6 (six) hours as needed for mild pain (or Fever >/= 101).     ALPRAZolam 0.25 MG tablet  Commonly known as:  XANAX  Take 0.25 mg by mouth at bedtime.     atorvastatin 10 MG tablet  Commonly known as:  LIPITOR  Take 1 tablet (10 mg total) by mouth daily.     b complex vitamins tablet  Take 1 tablet by mouth daily.     ferrous sulfate 160 (50 Fe) MG Tbcr SR tablet  Commonly known as:  SLOW FE  Take 160 mg by mouth daily.     Fish Oil 1200 MG Caps  Take 1,200 mg by mouth daily.     FLUOCINOLONE ACETONIDE BODY 0.01 % Oil  Apply 1 mL topically 2 (two) times daily as needed (for flares).     furosemide 40 MG tablet  Commonly known as:  LASIX  Take 40 mg by mouth 2 (two) times daily.     hydroxychloroquine 200 MG tablet  Commonly known as:  PLAQUENIL  Take 200 mg by mouth 2 (two) times daily.     ketoconazole 2 % shampoo  Commonly known as:  NIZORAL  Apply 1 application topically every 14 (fourteen) days. Leave on 5 minutes before rinsing out     lidocaine 5 %  Commonly known as:  LIDODERM  Place 1 patch onto the skin daily as needed (for pain).     metoprolol succinate 25 MG 24 hr tablet  Commonly known as:  TOPROL-XL  Take 1 tablet (25 mg total) by mouth 2 (two) times daily.     OXYGEN  Inhale 4-5 L into the lungs continuous.      senna-docusate 8.6-50 MG tablet  Commonly known as:  Senokot-S  Take 2 tablets by mouth 2 (two) times daily.     ZYLOPRIM 300 MG tablet  Generic drug:  allopurinol  Take 300 mg by mouth daily.        Discharge Assessment: Filed Vitals:   08/25/15 0837 08/25/15 0838  BP: 101/50 101/50  Pulse: 59   Temp:    Resp:     Skin clean, dry and intact without evidence of skin break down, no evidence of skin tears noted. Blister covered with foam to right buttock. IV catheter discontinued with catheter tip intact. Site without signs and symptoms of complications - no redness or edema noted at insertion site, patient denies c/o pain - only slight tenderness at site.  Dressing with slight pressure applied.  D/c Instructions-Education: Discharge instructions given to patient with verbalized understanding. D/c education completed with patient including follow up instructions, medication list, d/c activities limitations if indicated, with other d/c instructions  as indicated by MD - patient able to verbalize understanding, all questions fully answered.Patient instructed to return to ED, call 911, or call MD for any changes in condition.  Patient in room getting dressed at this time. Pt called for her ride to come pick her up. Pt educated to call out for wheelchair when ride arrives. Pt verbalized understanding. Will continue to monitor pt until discharged out of facility.    Dorita Fray, RN 08/25/2015 2:01 PM

## 2015-08-25 NOTE — Progress Notes (Signed)
Patient ID: April Mueller, female   DOB: 1946/06/03, 70 y.o.   MRN: [id number]    Subjective:  Underwent EGD/Colon 1/13 without source of bleeding. Capsule endoscopy without bleeding source.  She has had total 5 units PRBCs but hemoglobin now stable.  Started amio 08/20/15 for AF. Remains in AF in 15s. Remained in A fib so amio was stopped and Toprol XL was started.  Rate controlled.   Ongoing dyspnea with exertion but this is her baseline. No BMs.  Overall feeling better.      Scheduled Meds: . sodium chloride   Intravenous Once  . atorvastatin  10 mg Oral Daily  . bisacodyl  10 mg Oral Once  . darbepoetin (ARANESP) injection - NON-DIALYSIS  40 mcg Subcutaneous Q Tue-1800  . furosemide  40 mg Oral BID  . metoprolol succinate  25 mg Oral BID  . polyethylene glycol  34 g Oral Once  . senna-docusate  2 tablet Oral BID  . sodium chloride  3 mL Intravenous Q12H   Continuous Infusions:   PRN Meds:.acetaminophen **OR** [DISCONTINUED] acetaminophen, ALPRAZolam, famotidine, ondansetron **OR** [DISCONTINUED] ondansetron (ZOFRAN) IV   Filed Vitals:   08/24/15 2100 08/24/15 2300 08/25/15 0500 08/25/15 0534  BP: 92/43   113/57  Pulse:  60    Temp: 99.1 F (37.3 C)   98 F (36.7 C)  TempSrc: Oral   Oral  Resp: 18 18  18   Height:      Weight:   216 lb 11.2 oz (98.294 kg)   SpO2:        Intake/Output Summary (Last 24 hours) at 08/25/15 0830 Last data filed at 08/25/15 0257  Gross per 24 hour  Intake    660 ml  Output    750 ml  Net    -90 ml    LABS: Basic Metabolic Panel:  Recent Labs  08/24/15 0326 08/25/15 0534  NA 139 138  K 5.1 4.8  CL 110 110  CO2 23 24  GLUCOSE 95 94  BUN 33* 36*  CREATININE 1.98* 1.84*  CALCIUM 10.0 9.7   Liver Function Tests: No results for input(s): AST, ALT, ALKPHOS, BILITOT, PROT, ALBUMIN in the last 72 hours. No results for input(s): LIPASE, AMYLASE in the last 72 hours. CBC:  Recent Labs  08/23/15 0337 08/24/15 0326  08/25/15 0534  WBC 12.1* 13.0* 9.6  NEUTROABS 9.8*  --   --   HGB 9.7* 9.4* 9.2*  HCT 31.1* 30.2* 29.7*  MCV 97.2 98.1 97.7  PLT 140* 135* 136*   Cardiac Enzymes: No results for input(s): CKTOTAL, CKMB, CKMBINDEX, TROPONINI in the last 72 hours. BNP: Invalid input(s): POCBNP D-Dimer: No results for input(s): DDIMER in the last 72 hours. Hemoglobin A1C: No results for input(s): HGBA1C in the last 72 hours. Fasting Lipid Panel: No results for input(s): CHOL, HDL, LDLCALC, TRIG, CHOLHDL, LDLDIRECT in the last 72 hours. Thyroid Function Tests: No results for input(s): TSH, T4TOTAL, T3FREE, THYROIDAB in the last 72 hours.  Invalid input(s): FREET3 Anemia Panel: No results for input(s): VITAMINB12, FOLATE, FERRITIN, TIBC, IRON, RETICCTPCT in the last 72 hours.  RADIOLOGY: Dg Chest 2 View  08/16/2015  CLINICAL DATA:  Weakness, shortness of breath for 5 days EXAM: CHEST  2 VIEW COMPARISON:  12/17/2014 FINDINGS: Cardiomediastinal silhouette is stable. Again noted dilated aortic arch and descending aorta. Status post median sternotomy. Stable right perihilar scarring. Central mild vascular congestion without convincing pulmonary edema. Mild left basilar atelectasis. Mild degenerative changes thoracic spine. No definite  superimposed infiltrate or pulmonary edema. IMPRESSION: Cardiomegaly again noted. 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 Status post median sternotomy. Stable scarring right midlung. Central mild vascular congestion without pulmonary edema. Mild left basilar atelectasis. No definite superimposed infiltrate. Electronically Signed   By: April Mueller M.D.   On: 08/16/2015 11:38    PHYSICAL EXAM General: NAD. In bed. .  Neck: JVP flat, no thyromegaly or lymphadenopathy noted.  Lungs: Mildly diminished bases. CV: Nondisplaced PMI.  Heart irregular S1/S2, no S3/S4, 2/6 HSM apex.  No peripheral edema.   Abdomen: Soft, NT, ND, no HSM. No bruits or  masses. +BS  Neurologic: Alert and oriented x 3.  Psych: Normal affect. Extremities: No clubbing or cyanosis.   TELEMETRY:Reviewed personally, Afib 50-60s  ASSESSMENT AND PLAN: April Mueller is a 70 yo with COPD, former smoker (28 pk-yrs), obesity, RA , CKD 4, Type A aortic dissection s/p repair 2005 c/b renal failure requiring HD x 3 months, PH - previously treated in [place]. Admitted with profound iron deficiency anemia. EGD/colon 1/13 negative. Pending capsule endo (swallowed 08/21/15, likely to be reviewed today per GI note)  1. 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 This appeared to be primarily pulmonary venous hypertension. Interestingly, there were very prominent V waves in the PCWP tracing. On exam, she has a prominent MR murmur. Concern that  significant MR may be playing a major role in her symptomatology. She had  a TEE in 6/16 showing very eccentric, posteriorly-directed mitral regurgitation possibly from subtle anterior leaflet prolapse. MR could be severe but is difficult to fully visualize. Her pulmonologist does not think that lung parenchymal disease adequately explains her degree of hypoxemia and dyspnea. She was seen at [facility] for consideration of percutaneous MV clip and was not thought to be a good candidate => concern that MV disease may not actually be severe and concern that her dyspnea is coming from something else and would not be improved by MV clipping.Volume status looks ok today.  - Renal function ok. Creatinine baseline 2-2.5.   Restarted lasix 40 mg twice a day.    - She is currently off amlodipine and spironolactone with soft BP.  I think that she should stay off these when she goes home.  2. Chronic respiratory failure: She is on 4-5 L home oxygen by [place]. Concern that  does have significant underlying lung parenchymal disease.  However, her pulmonologist (Dr April Mueller), did not think she had a significant degree of obstructive lung disease on last PFTs done in the last year. He thought that the restriction was primarily due to her body habitus.  - Continue supplemental oxygen.  3. Atrial fibrillation: Persistent.  This is newly noted this admission.  At risk with significant mitral regurgitation.  Not candidate for anticoagulation currently due to bleeding.  - Now on Toprol XL 25 mg bid for rate control, HR in 60s.  - CHADSVASC = 3, ideally would be anticoagulated but not candidate currently.  Hold off for a few weeks and start as an outpatient. Possibly Watch[facility] candidate down the road.  4. Anemia: Profound anemia at admission, suspect GI blood loss, probably upper with black stool and elevated BUN.  - EGD/colon on 1/13 without clear source. Capsule endoscopy negative.   - Hemoglobin today 9.2 -Will need to hold off starting anticoagulants. Will reassess this at follow up appointment.   5. OSA:  Needs CPAP nightly.  6. Acute on CKD: Stage IV. Creatinine initially elevated, now back to her baseline at 1.84 7. Aortic dissection s/p repair 2005: Although complete records are not available, it appears that the patient developed a Type A dissection in 2005 with ascending aorta repair (can see the surgically repaired ascending aorta well on TEE). She has residual dissection in the descending thoracic aorta.  8. 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 RHC 1/16 showed low PVR. She is now off macitentan has felt better off it. She may need eventual RHC. 9. Mitral regurgitation: Prior TEE concerning for possibly severe, eccentric mitral regurgitation, most likely due to subtle prolapse of the anterior mitral valve leaflet. I have been concerned that the MR is playing a role in her CHF. She had prominent  V-waves on RHC 1/16, and she has a mitral area murmur. Not thought to be be a  good candidate for open MV surgery. She was evaluated at [facility] for possible percutaneous mitral clipping. She was turned down for this as her MR was not clearly severe and fixing it may not help her hypoxemia appreciably. Echo this admission appears to show moderate MR.   MEDS:  Lasix 40 mg twice a day.  Toprol XL 25 mg twice a day  Please dont send her home on amlodipine or spironolactone.   We will schedule HF follow up. January 30th with Dr Aundra Dubin. Plan to check BMET and CBC at that time.   Everlina Gotts NP-C  08/25/2015 8:30 AM

## 2015-08-25 NOTE — Progress Notes (Addendum)
CM received called from discharging  MD regarding home health services. Pt continues to refuses hh services 2/2 to copay cost. Pt states can care for her self with the assistance from family friend if needed.

## 2015-08-25 NOTE — Discharge Instructions (Signed)
Follow with Primary MD  Melinda Crutch, MD in 7 days   Get CBC, CMP, checked  by Primary MD next visit.    Activity: As tolerated with Full fall precautions use walker/cane & assistance as needed   Disposition Home    Diet: Heart Healthy  , with feeding assistance and aspiration precautions.  For Heart failure patients - Check your Weight same time everyday, if you gain over 2 pounds, or you develop in leg swelling, experience more shortness of breath or chest pain, call your Primary MD immediately. Follow Cardiac Low Salt Diet and 1.5 lit/day fluid restriction.   On your next visit with your primary care physician please Get Medicines reviewed and adjusted.   Please request your Prim.MD to go over all Hospital Tests and Procedure/Radiological results at the follow up, please get all Hospital records sent to your Prim MD by signing hospital release before you go home.   If you experience worsening of your admission symptoms, develop shortness of breath, life threatening emergency, suicidal or homicidal thoughts you must seek medical attention immediately by calling 911 or calling your MD immediately  if symptoms less severe.  You Must read complete instructions/literature along with all the possible adverse reactions/side effects for all the Medicines you take and that have been prescribed to you. Take any new Medicines after you have completely understood and accpet all the possible adverse reactions/side effects.   Do not drive, operating heavy machinery, perform activities at heights, swimming or participation in water activities or provide baby sitting services if your were admitted for syncope or siezures until you have seen by Primary MD or a Neurologist and advised to do so again.  Do not drive when taking Pain medications.    Do not take more than prescribed Pain, Sleep and Anxiety Medications  Special Instructions: If you have smoked or chewed Tobacco  in the last 2 yrs please  stop smoking, stop any regular Alcohol  and or any Recreational drug use.  Wear Seat belts while driving.   Please note  You were cared for by a hospitalist during your hospital stay. If you have any questions about your discharge medications or the care you received while you were in the hospital after you are discharged, you can call the unit and asked to speak with the hospitalist on call if the hospitalist that took care of you is not available. Once you are discharged, your primary care physician will handle any further medical issues. Please note that NO REFILLS for any discharge medications will be authorized once you are discharged, as it is imperative that you return to your primary care physician (or establish a relationship with a primary care physician if you do not have one) for your aftercare needs so that they can reassess your need for medications and monitor your lab values.

## 2015-08-25 NOTE — Discharge Summary (Signed)
April Mueller, is a 70 y.o. female  DOB 05/29/1946  MRN [id number].  Admission date:  08/16/2015  Admitting Physician  Waldemar Dickens, MD  Discharge Date:  08/25/2015   Primary MD   Melinda Crutch, MD  Recommendations for primary care physician for things to follow:  - Please check CBC, BMP during next visit - Please follow the CHF team on 09/05/2015 - follow with hematology Dr. Irene Limbo as an outpatient regarding ongoing workup for anemia.   Admission Diagnosis  Symptomatic anemia [D64.9]   Discharge Diagnosis  Symptomatic anemia [D64.9]   Principal Problem:   Symptomatic anemia Active Problems:   Secondary pulmonary hypertension (HCC)   Rheumatoid arthritis (HCC)   OSA on CPAP   Chronic diastolic heart failure (HCC)   Chronic respiratory failure with hypoxia (HCC)   Mitral regurgitation   Normocytic anemia   CKD (chronic kidney disease) stage 3, GFR 30-59 ml/min   HTN (hypertension)   Leukocytosis   Acute hyperkalemia   Myelodysplastic syndrome (HCC)   Anemia   Heme positive stool   History of colonic polyps   Benign neoplasm of cecum   Benign neoplasm of ascending colon   PAF (paroxysmal atrial fibrillation) (HCC)   Acute on chronic diastolic CHF (congestive heart failure) ([place])      Past Medical History  Diagnosis Date  . Hypertension   . Pulmonary HTN ([place])   . Aortic aneurysm ([place])   . Sleep apnea     wears CPAP  . CKD (chronic kidney disease) stage 3, GFR 30-59 ml/min   . Anemia 03/23/15    transfusion  . Arthritis   . CHF (congestive heart failure) ([place])   . Gout   . Rheumatoid arthritis(714.0) 11/06/2012    Past Surgical History  Procedure Laterality Date  . Abdominal aortic aneurysm repair    . Right heart catheterization N/A 08/18/2014    Procedure: RIGHT HEART CATH;  Surgeon: Jolaine Artist, MD;  Location: [facility] CATH LAB;  Service: Cardiovascular;  Laterality: N/A;    . Tee without cardioversion N/A 01/05/2015    Procedure: TRANSESOPHAGEAL ECHOCARDIOGRAM (TEE);  Surgeon: Larey Dresser, MD;  Location: [place];  Service: Cardiovascular;  Laterality: N/A;  . Wisdom tooth extraction    . Givens capsule study N/A 08/21/2015    Procedure: GIVENS CAPSULE STUDY;  Surgeon: Gatha Mayer, MD;  Location: [place];  Service: Endoscopy;  Laterality: N/A;  . Esophagogastroduodenoscopy N/A 08/19/2015    Procedure: ESOPHAGOGASTRODUODENOSCOPY (EGD);  Surgeon: Jerene Bears, MD;  Location: [facility] ENDOSCOPY;  Service: Endoscopy;  Laterality: N/A;  patient scheduled, anesthesia aware of 1500 case, per [place].  08/18/15 DP  . Colonoscopy N/A 08/19/2015    Procedure: COLONOSCOPY;  Surgeon: Jerene Bears, MD;  Location: [facility] ENDOSCOPY;  Service: Endoscopy;  Laterality: N/A;       History of present illness and  Hospital Course:     Kindly see H&P for history of present illness and admission details, please review complete Labs, Consult reports and Test  reports for all details in brief  HPI  from the history and physical done on the day of admission 08/16/2015  This is a 70 year old female patient with known chronic anemia followed by oncology, chronic hypoxemic respiratory failure with a degree of chronic dyspnea on exertion in the setting of pulmonary hypertension chronic diastolic heart failure and mitral regurgitation, she also has a history of chronic kidney disease, rheumatoid arthritis on Plaquenil, sleep apnea and CPAP, hypertension and obesity. Patient reports that for the past 5 days she's had progressive worsening of her dyspnea on exertion and this has been associated with malaise fatigue generalized weakness and chest discomfort while mobilizing. She's also had anorexia. She attempted to turn up her oxygen without any improvement in her symptoms. She denies weight gain and worsening edema. She has chronic dark stools which have not changed in consistency or odor. Her  last IV iron infusion was 3 months ago and she has received 1 dose of Aranesp and was due another dose this week. She's not had any constitutional symptoms such as fevers or chills abdominal pain nausea vomiting diarrhea no dysuria. She's not had any upper respiratory symptoms nor cough.  ER Evaluation and treatment: Afebrile, pulse rate 66 and regular, BP 83/54, 3 L 100% sat EKG: Sinus rhythm with borderline prolonged PR of 0.24, QTC 450 ms, no ischemic changes, low voltage capture in V2 likely more related to lead placement Two-view chest x-ray with no acute changes with mild central vascular congestion without pulmonary edema Potassium 5.6 with repeat 5.3 BUN 98 creatinine 2.76 with baseline BUN 45 and creatinine 2.8 Troponin 0.00 Lactic acid 2.1 WBC 14,400 with neutrophils 84% and absolute neutrophils 12.1% Hemoglobin 5.2 with recent baselines between 0.4 and 10.1 Normal saline IV bolus 1 for hypotension with lowest blood pressure reading 78/59 2 units of packed red blood cells and been ordered and awaiting preparation for transfusion  Hospital Course   70 year old female patient with history of COPD, obesity, rheumatoid arthritis on Plaquenil, type A aortic dissection status post repair 2005, PAH, OSA on nightly CPAP, chronic diastolic CHF, chronic kidney disease IV, severe MR-not a good candidate for MV clip, anemia related to CKD/RA/MDS, chronic respiratory failure on home oxygen admitted to [facility] on 08/16/15 with symptomatic anemia and hemoglobin of 5.2. She has chronic black stools attributed to iron supplements. Patient was transfused 3 units of PRBCs. Baseline hemoglobin set to be in the 9-10 grams per DL range. [facility] consulted. Status post EGD (normal) and colonoscopy (multiple polyps-polypectomy) but no obvious source of bleeding. Capsule endoscopy neg. New onset A. fib with controlled ventricular rate-on IV amiodarone initially, currently transitioned to beta blockers.  Acute on  chronic anemia, symptomatic - Chronic anemia related to chronic kidney disease, rheumatoid arthritis and chronic disease, MDS and iron deficiency. Acute anemia may have been due to GI blood loss. - Follows with Dr. Irene Limbo, Hematology. Patient was on iron infusions and Epogen. - Anemia panel: Iron 13, TIBC 311, saturation ratio 4, ferritin 17, folate 42, B12: 539. Will provide a dose of IV iron per pharmacy. - She had received 3 units PRBCs earlier in admission. Hemoglobin had improved in the mid 7 g range and then dropped to 7.2 on 1/14. Due to symptomatic anemia, transfused additional 2 units PRBCs on 1/14. Hemoglobin stable in the 9 g range for the last 3 days. - Completed GI workup including EGD, colonoscopy and capsule endoscopy which did not reveal source of bleeding. GI signed off. Do not  recommend outpatient GI follow-up. - Dr Algis Liming informed Dr. Irene Limbo re hospitalization and course. - Hemoglobin is 9.2 at day of discharge.  FOBT +/possible GI bleed - Completed GI workup including EGD, colonoscopy and capsule endoscopy which did not reveal source of bleeding. GI signed off. Do not recommend outpatient GI follow-up.  Hypotension, on 1/14  - May be related to volume depletion from bowel prep for colonoscopy and anemia. Transfused 2 units of PRBCs. - amlodipine and Aldactone stopped by cardiology, were not resumed on discharge especially with hyperkalemia- resumed on  Lasix 40 mg oral twice a day and lower dose Toprol-XL 25 mg twice a day  Acute on Stage IV chronic kidney disease - Patient's baseline creatinine may be in the 2.2 range. Admitted with creatinine of 2.76-may have been secondary to outpatient diuretics.   Chronic diastolic CHF/pulmonary hypertension - Compensated plan per cardiology, resumed on Lasix and beta blockers .  Thrombocytopenia - Stable  Essential hypertension - Amlodipine and Aldactone has been stopped, resumed on Lasix, Toprol-XL dose has been  lowered.  Rheumatoid arthritis - Follows with Dr. Gavin Pound as outpatient.resumed  Plaquenil held currently.  OSA on CPAP/chronic respiratory failure on home oxygen - Continue CPAP and oxygen supplementation-3 L/m continuously at home.  Severe MR - Not candidate for surgical intervention.  Hyperkalemia - Resolved, potassium is 5.1, Aldactone on hold.  New onset A. fib with controlled ventricular rate.  - First noted on telemetry on 1/14. Cardiology follow-up appreciated. Started on amiodarone drip initially,stoped p amiodarone given history of lung disease and uses Toprol-XL. - CHADSVASC = 3, ideally would be anticoagulated but not candidate currently. Possibly Watchman candidate down the road.  - Not anticoagulation candidate secondary to presumed GI bleeding and severe symptomatic anemia on presentation.  - IV amiodarone drip  transitionedto Toprol-XL per cardiology , currently heart rate controlled .  Colonic polyps - As seen and managed on colonoscopy. Pathology results as below.  - Diagnosis Colon, polyp(s), Right ascending - TUBULAR ADENOMA(S). - HIGH GRADE DYSPLASIA IS NOT IDENTIFIED.  Constipation -  on bowel regimen    Discharge Condition:  stable   Follow UP  Follow-up Information    Follow up with [name], MD On 09/05/2015.   Specialty:  Cardiology   Why:  at 9:30    Contact information:   [address] [place] [address] [phone]       Follow up with  Melinda Crutch, MD. Schedule an appointment as soon as possible for a visit in 1 week.   Specialty:  Family Medicine   Why:  Posthospitalization follow-up   Contact information:   [place] [address] [phone]         Discharge Instructions  and  Discharge Medications     Discharge Instructions    Diet - low sodium heart healthy    Complete by:  As directed      Discharge instructions    Complete by:  As directed   Follow with Primary  MD  Melinda Crutch, MD in 7 days   Get CBC, CMP, checked  by Primary MD next visit.    Activity: As tolerated with Full fall precautions use walker/cane & assistance as needed   Disposition Home    Diet: Heart Healthy  , with feeding assistance and aspiration precautions.  For Heart failure patients - Check your Weight same time everyday, if you gain over 2 pounds, or you develop in leg swelling, experience  more shortness of breath or chest pain, call your Primary MD immediately. Follow Cardiac Low Salt Diet and 1.5 lit/day fluid restriction.   On your next visit with your primary care physician please Get Medicines reviewed and adjusted.   Please request your Prim.MD to go over all Hospital Tests and Procedure/Radiological results at the follow up, please get all Hospital records sent to your Prim MD by signing hospital release before you go home.   If you experience worsening of your admission symptoms, develop shortness of breath, life threatening emergency, suicidal or homicidal thoughts you must seek medical attention immediately by calling 911 or calling your MD immediately  if symptoms less severe.  You Must read complete instructions/literature along with all the possible adverse reactions/side effects for all the Medicines you take and that have been prescribed to you. Take any new Medicines after you have completely understood and accpet all the possible adverse reactions/side effects.   Do not drive, operating heavy machinery, perform activities at heights, swimming or participation in water activities or provide baby sitting services if your were admitted for syncope or siezures until you have seen by Primary MD or a Neurologist and advised to do so again.  Do not drive when taking Pain medications.    Do not take more than prescribed Pain, Sleep and Anxiety Medications  Special Instructions: If you have smoked or chewed Tobacco  in the last 2 yrs please stop smoking, stop  any regular Alcohol  and or any Recreational drug use.  Wear Seat belts while driving.   Please note  You were cared for by a hospitalist during your hospital stay. If you have any questions about your discharge medications or the care you received while you were in the hospital after you are discharged, you can call the unit and asked to speak with the hospitalist on call if the hospitalist that took care of you is not available. Once you are discharged, your primary care physician will handle any further medical issues. Please note that NO REFILLS for any discharge medications will be authorized once you are discharged, as it is imperative that you return to your primary care physician (or establish a relationship with a primary care physician if you do not have one) for your aftercare needs so that they can reassess your need for medications and monitor your lab values.     Increase activity slowly    Complete by:  As directed             Medication List    STOP taking these medications        amLODipine 5 MG tablet  Commonly known as:  NORVASC     aspirin EC 81 MG tablet     Potassium Chloride ER 20 MEQ Tbcr     spironolactone 25 MG tablet  Commonly known as:  ALDACTONE      TAKE these medications        acetaminophen 325 MG tablet  Commonly known as:  TYLENOL  Take 2 tablets (650 mg total) by mouth every 6 (six) hours as needed for mild pain (or Fever >/= 101).     ALPRAZolam 0.25 MG tablet  Commonly known as:  XANAX  Take 0.25 mg by mouth at bedtime.     atorvastatin 10 MG tablet  Commonly known as:  LIPITOR  Take 1 tablet (10 mg total) by mouth daily.     b complex vitamins tablet  Take 1 tablet by mouth daily.  ferrous sulfate 160 (50 Fe) MG Tbcr SR tablet  Commonly known as:  SLOW FE  Take 160 mg by mouth daily.     Fish Oil 1200 MG Caps  Take 1,200 mg by mouth daily.     FLUOCINOLONE ACETONIDE BODY 0.01 % Oil  Apply 1 mL topically 2 (two) times  daily as needed (for flares).     furosemide 40 MG tablet  Commonly known as:  LASIX  Take 40 mg by mouth 2 (two) times daily.     hydroxychloroquine 200 MG tablet  Commonly known as:  PLAQUENIL  Take 200 mg by mouth 2 (two) times daily.     ketoconazole 2 % shampoo  Commonly known as:  NIZORAL  Apply 1 application topically every 14 (fourteen) days. Leave on 5 minutes before rinsing out     lidocaine 5 %  Commonly known as:  LIDODERM  Place 1 patch onto the skin daily as needed (for pain).     metoprolol succinate 25 MG 24 hr tablet  Commonly known as:  TOPROL-XL  Take 1 tablet (25 mg total) by mouth 2 (two) times daily.     OXYGEN  Inhale 4-5 L into the lungs continuous.     senna-docusate 8.6-50 MG tablet  Commonly known as:  Senokot-S  Take 2 tablets by mouth 2 (two) times daily.     ZYLOPRIM 300 MG tablet  Generic drug:  allopurinol  Take 300 mg by mouth daily.          Diet and Activity recommendation: See Discharge Instructions above   Consults obtained -   [facility]  Cardiology  Major procedures and Radiology Reports - PLEASE review detailed and final reports for all details, in brief -    Nightly CPAP  Colonoscopy 08/19/15: ENDOSCOPIC IMPRESSION: 1. Two sessile polyps measuring 3 and 10 mm mm in size were found at the cecum; polypectomies were performed with cold snare; hemoclip placement x 1 at site of larger polypectomy 2. Three sessile polyps ranging from 5 to 65mm in size were found in the ascending colon; polypectomies were performed with a cold snare 3. There was severe diverticulosis noted in the sigmoid colon  RECOMMENDATIONS: 1. Await pathology results 2. High fiber diet 3. Follow-up with hematology as an outpatient. Monitor Hgb and iron studies. If anemia progresses or further evidence of GI bleeding, then would recommend referral back to GI for video capsule endoscopy.   EGD 08/19/15: ENDOSCOPIC  IMPRESSION: Normal EGD  RECOMMENDATIONS: Proceed with a Colonoscopy.   Capsule endoscopy: Normal small bowel.    Dg Chest 2 View  08/16/2015  CLINICAL DATA:  Weakness, shortness of breath for 5 days EXAM: CHEST  2 VIEW COMPARISON:  12/17/2014 FINDINGS: Cardiomediastinal silhouette is stable. Again noted dilated aortic arch and descending aorta. Status post median sternotomy. Stable right perihilar scarring. Central mild vascular congestion without convincing pulmonary edema. Mild left basilar atelectasis. Mild degenerative changes thoracic spine. No definite superimposed infiltrate or pulmonary edema. IMPRESSION: Cardiomegaly again noted. 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 Status post median sternotomy. Stable scarring right midlung. Central mild vascular congestion without pulmonary edema. Mild left basilar atelectasis. No definite superimposed infiltrate. Electronically Signed   By: Lahoma Crocker M.D.   On: 08/16/2015 11:38    Micro Results     Recent Results (from the past 240 hour(s))  MRSA PCR Screening     Status: None   Collection Time: 08/16/15  7:19 PM  Result  Value Ref Range Status   MRSA by PCR NEGATIVE NEGATIVE Final    Comment:        The GeneXpert MRSA Assay (FDA approved for NASAL specimens only), is one component of a comprehensive MRSA colonization surveillance program. It is not intended to diagnose MRSA infection nor to guide or monitor treatment for MRSA infections.        Today   Subjective:   Lolita Cram today Reports chronic unchanged DOE. Had BM 2 yesterday. No palpitations or chest pain reported.  Objective:   Blood pressure 101/50, pulse 59, temperature 98 F (36.7 C), temperature source Oral, resp. rate 18, height 5\' 4"  (1.626 m), weight 98.294 kg (216 lb 11.2 oz), SpO2 100 %.   Intake/Output Summary (Last 24 hours) at 08/25/15 1142 Last data filed at 08/25/15 1122  Gross per 24 hour  Intake     300 ml  Output   1100 ml  Net   -800 ml    Exam General exam: Pleasant middle-aged female sitting up comfortably in chair this morning. Respiratory system: Clear. No increased work of breathing. Cardiovascular system: S1 & S2 heard, irregular. No JVD, gallops, clicks or pedal edema. Telemetry: A. fib with controlled ventricular rate.  Gastrointestinal system: Abdomen is nondistended, soft and nontender. Normal bowel sounds heard. Central nervous system: Alert and oriented. No focal neurological deficits. Extremities: Symmetric 5 x 5 power.  Data Review   CBC w Diff: Lab Results  Component Value Date   WBC 9.6 08/25/2015   WBC 7.0 07/19/2015   HGB 9.2* 08/25/2015   HGB 9.2* 07/19/2015   HCT 29.7* 08/25/2015   HCT 29.8* 07/19/2015   PLT 136* 08/25/2015   PLT 130* 07/19/2015   LYMPHOPCT 8 08/23/2015   LYMPHOPCT 15.2 07/19/2015   MONOPCT 9 08/23/2015   MONOPCT 7.7 07/19/2015   EOSPCT 2 08/23/2015   EOSPCT 2.3 07/19/2015   BASOPCT 0 08/23/2015   BASOPCT 0.7 07/19/2015    CMP: Lab Results  Component Value Date   NA 138 08/25/2015   NA 142 06/21/2015   K 4.8 08/25/2015   K 5.0 06/21/2015   CL 110 08/25/2015   CO2 24 08/25/2015   CO2 25 06/21/2015   BUN 36* 08/25/2015   BUN 45.6* 06/21/2015   CREATININE 1.84* 08/25/2015   CREATININE 2.8* 06/21/2015   PROT 7.2 06/21/2015   PROT 7.9 04/30/2014   ALBUMIN 3.4* 06/21/2015   ALBUMIN 3.6 04/30/2014   BILITOT <0.30 06/21/2015   BILITOT 0.4 04/30/2014   ALKPHOS 71 06/21/2015   ALKPHOS 68 04/30/2014   AST 18 06/21/2015   AST 18 04/30/2014   ALT 12 06/21/2015   ALT 12 04/30/2014  .   Total Time in preparing paper work, data evaluation and todays exam - 35 minutes  Jamarian Jacinto M.D on 08/25/2015 at 11:42 AM  [facility]   Office  [phone]

## 2015-08-29 ENCOUNTER — Encounter: Payer: Self-pay | Admitting: Internal Medicine

## 2015-09-05 ENCOUNTER — Encounter (HOSPITAL_COMMUNITY): Payer: Self-pay

## 2015-09-05 ENCOUNTER — Ambulatory Visit (HOSPITAL_COMMUNITY)
Admit: 2015-09-05 | Discharge: 2015-09-05 | Disposition: A | Discharge status: Home / Self Care | Payer: Medicare Other | Source: Ambulatory Visit | Attending: Cardiology | Admitting: Cardiology

## 2015-09-05 VITALS — BP 104/60 | HR 61 | Wt 221.5 lb

## 2015-09-05 DIAGNOSIS — Z87891 Personal history of nicotine dependence: Secondary | ICD-10-CM | POA: Diagnosis not present

## 2015-09-05 DIAGNOSIS — M069 Rheumatoid arthritis, unspecified: Secondary | ICD-10-CM | POA: Insufficient documentation

## 2015-09-05 DIAGNOSIS — Z9981 Dependence on supplemental oxygen: Secondary | ICD-10-CM | POA: Insufficient documentation

## 2015-09-05 DIAGNOSIS — G4733 Obstructive sleep apnea (adult) (pediatric): Secondary | ICD-10-CM | POA: Insufficient documentation

## 2015-09-05 DIAGNOSIS — E669 Obesity, unspecified: Secondary | ICD-10-CM | POA: Diagnosis not present

## 2015-09-05 DIAGNOSIS — N183 Chronic kidney disease, stage 3 unspecified: Secondary | ICD-10-CM

## 2015-09-05 DIAGNOSIS — I7101 Dissection of thoracic aorta: Secondary | ICD-10-CM | POA: Insufficient documentation

## 2015-09-05 DIAGNOSIS — J449 Chronic obstructive pulmonary disease, unspecified: Secondary | ICD-10-CM | POA: Diagnosis not present

## 2015-09-05 DIAGNOSIS — J9610 Chronic respiratory failure, unspecified whether with hypoxia or hypercapnia: Secondary | ICD-10-CM | POA: Diagnosis not present

## 2015-09-05 DIAGNOSIS — I480 Paroxysmal atrial fibrillation: Secondary | ICD-10-CM | POA: Diagnosis not present

## 2015-09-05 DIAGNOSIS — IMO0002 Reserved for concepts with insufficient information to code with codable children: Secondary | ICD-10-CM

## 2015-09-05 DIAGNOSIS — D649 Anemia, unspecified: Secondary | ICD-10-CM | POA: Insufficient documentation

## 2015-09-05 DIAGNOSIS — I340 Nonrheumatic mitral (valve) insufficiency: Secondary | ICD-10-CM | POA: Diagnosis not present

## 2015-09-05 DIAGNOSIS — D638 Anemia in other chronic diseases classified elsewhere: Secondary | ICD-10-CM | POA: Diagnosis not present

## 2015-09-05 DIAGNOSIS — N184 Chronic kidney disease, stage 4 (severe): Secondary | ICD-10-CM | POA: Diagnosis not present

## 2015-09-05 DIAGNOSIS — I481 Persistent atrial fibrillation: Secondary | ICD-10-CM | POA: Diagnosis not present

## 2015-09-05 DIAGNOSIS — Z833 Family history of diabetes mellitus: Secondary | ICD-10-CM | POA: Insufficient documentation

## 2015-09-05 DIAGNOSIS — Z79899 Other long term (current) drug therapy: Secondary | ICD-10-CM | POA: Diagnosis not present

## 2015-09-05 DIAGNOSIS — I272 Other secondary pulmonary hypertension: Secondary | ICD-10-CM | POA: Insufficient documentation

## 2015-09-05 DIAGNOSIS — Z841 Family history of disorders of kidney and ureter: Secondary | ICD-10-CM | POA: Insufficient documentation

## 2015-09-05 DIAGNOSIS — I5032 Chronic diastolic (congestive) heart failure: Secondary | ICD-10-CM | POA: Insufficient documentation

## 2015-09-05 DIAGNOSIS — Z8249 Family history of ischemic heart disease and other diseases of the circulatory system: Secondary | ICD-10-CM | POA: Diagnosis not present

## 2015-09-05 DIAGNOSIS — I5033 Acute on chronic diastolic (congestive) heart failure: Secondary | ICD-10-CM | POA: Diagnosis present

## 2015-09-05 LAB — CBC
HCT: 33.1 % — ABNORMAL LOW (ref 36.0–46.0)
HEMOGLOBIN: 10.1 g/dL — AB (ref 12.0–15.0)
MCH: 30.8 pg (ref 26.0–34.0)
MCHC: 30.5 g/dL (ref 30.0–36.0)
MCV: 100.9 fL — ABNORMAL HIGH (ref 78.0–100.0)
Platelets: 135 10*3/uL — ABNORMAL LOW (ref 150–400)
RBC: 3.28 MIL/uL — ABNORMAL LOW (ref 3.87–5.11)
RDW: 17.5 % — AB (ref 11.5–15.5)
WBC: 6.6 10*3/uL (ref 4.0–10.5)

## 2015-09-05 LAB — BRAIN NATRIURETIC PEPTIDE: B NATRIURETIC PEPTIDE 5: 403.1 pg/mL — AB (ref 0.0–100.0)

## 2015-09-05 LAB — BASIC METABOLIC PANEL
ANION GAP: 11 (ref 5–15)
BUN: 28 mg/dL — AB (ref 6–20)
CALCIUM: 10.2 mg/dL (ref 8.9–10.3)
CO2: 27 mmol/L (ref 22–32)
Chloride: 107 mmol/L (ref 101–111)
Creatinine, Ser: 1.87 mg/dL — ABNORMAL HIGH (ref 0.44–1.00)
GFR calc Af Amer: 31 mL/min — ABNORMAL LOW (ref >60)
GFR, EST NON AFRICAN AMERICAN: 27 mL/min — AB (ref >60)
GLUCOSE: 121 mg/dL — AB (ref 65–99)
Potassium: 4.1 mmol/L (ref 3.5–5.1)
Sodium: 145 mmol/L (ref 135–145)

## 2015-09-05 MED ORDER — FUROSEMIDE 40 MG PO TABS: ORAL_TABLET | ORAL | Status: DC

## 2015-09-05 MED ORDER — METOPROLOL SUCCINATE ER 25 MG PO TB24: 25.0000 mg | ORAL_TABLET | Freq: Two times a day (BID) | ORAL | Status: DC

## 2015-09-05 NOTE — Patient Instructions (Signed)
Increase Furosemide (Lasix) to 80 mg (2 tabs) in AM and 40 mg (1 tab) in PM  Restart your Iron pill  Please schedule follow up appointment with Dr Inetta Fermo today  Your physician recommends that you schedule a follow-up appointment in: 1 week

## 2015-09-05 NOTE — Progress Notes (Signed)
Patient ID: April Mueller, female   DOB: [birth year], 70 y.o.   MRN: [id number]   HPI  PCP: Dr Harrington Challenger Pulmonary: Dr Byrum/Dr Melvyn Novas   April Mueller is a 70 yo with following PMHx  1) COPD       --former smoker (28 pk-yrs)       --on home O2 - 4L/min rest, up to 5L/min exertion.        --spirometry 6/13 in [place] = 0.85L FVC = 1.3L no DLCO measured. FEV1 in 2014 = 0.73        --CT chest 10/14 - moderate centrilobular emphysema. No pulm fibrosis but there was evidence for post-infectious/inflammatory scarring. Stable Asc Ao repair       --PFTs (11/14) FVC 46%, FEV1 0.8L (40%), ratio 87%, FEF 25-75% 0.44L DLCO 22%, TLC 50% => PFTs in 06/2013 did NOT suggest significant airflow obstruction according to Dr Gustavus Bryant last note despite "moderate emphysema" on CT.        --PFTs (6/16) were abnormal with severe obstruction, severe restriction, severely decreased DLCO.  Reviewed with Dr Melvyn Novas => he thinks obstruction is actually fairly minimal and that the restriction is due to body habitus.   2) Obesity 3) RA       --on plaquenil, sees Dr Lenna Gilford.  4) Type A aortic dissection s/p repair 2005     --c/b renal failure requiring HD x 3 months 5) PAH - previously treated in [place].  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      --on macitentan (revatio stopped 10/14).     --V/Q scan 11/14 no chronic PE     -- 2015 Tracleer stopped and started on macitentan. 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  6) OSA 7) Diastolic CHF    --ECHO [id number] EF 55-60% RV mildly dilated with normal function. Mild to moderate posterior MR. RVSP 56. Probable restrictive diastolic filling pattern    --ECHO 9/15 EF 60-65% RV mildly dilated with normal function. Mild to moderate posterior MR. RVSP 40. Ao Root ok    --TEE 6/16 with EF 55-60%, D-shaped interventricular septum suggestive of RV pressure/volume overload, RV mildly dilated with normal systolic function, peak RV-RA gradient 57 mmHg, very eccentric  posteriorly-directed mitral regurgitation, possibly severe, etiology may be a very small area of prolapse on the anterior leaflet, s/p ascending aorta repair with residual dissection flap in the descending thoracic aorta.       --Echo (1/17) with EF 55-60%, mild to moderate MR, PASP 66 8) CKD 9) RHC 08/18/2014: No role for pulmonary vasodilators.  RA = 14 RV = 62/3/15 PA = 63/21 (38) PCW = mean 23 v waves to 40 Fick cardiac output/index = 6.3/3.2 Thermodilution CO/CI = 6.1/3.1, PVR = 2.0 WU, FA sat = 91% PA sat = 57%, 62% 10) Mitral regurgitation: Possibly severe by 6/16 TEE, very eccentric.  Not good candidate for percutaneous MV clip (seen at [facility]).  Echo (1/17) with only mild to moderate MR.  11) Anemia: Suspect from chronic disease/renal disease but has been found to be FOBT+.  EGD/colonoscopy/capsule endoscopy in 1/17 did not show any definite site for blood loss. 12) Atrial fibrillation: Persistent, 1st noted in 1/17.   Moved back from [place] and saw Dr. Lamonte Sakai. In [place] was followed by cardiology and pulmonary. Started on Tracleer in 2006. Revatio was added, however was discontinued without any change in her symptoms. Stopped smoking in January 2005 when she had aortic aneurysm repair. Smoked 1.5-2 ppd x 15 -  20 years.  Now on macitentan.  I had her do a TEE in 6/16.  She had a loud MR murmur.  TEE showed very eccentric, posteriorly-directed MR that may be from subtle prolapse of the anterior mitral leaflet.  I am concerned that the MR could be severe but very difficult to fully visualize.  She additionally had PFTs done that were abnormal, but according to her pulmonologist Melvyn Novas), the obstruction was really only minimal and the restriction was primarily related to body habitus.  He does not think that we can explain her hypoxemia and dyspnea primarily from parenchymal lung pathology.  I sent her to [facility] for evaluation for percutaneous mitral valve clip.  They did not think that she was a good candidate.    She has been anemic and received 1 unit PRBCs in 8/16.  She saw GI and was noted to be heme negative, no scopes were done (heme negative and high risk). She has had iron infusions.   Opsumit was stopped in 9/16 due to suspicion for group 2 pulmonary hypertension only (low PVR) and she felt better off it.   She was admitted in 1/17 with increased dyspnea and was found to be profoundly anemic.  She had a total of 5 units PRBCs and an iron infusion in the hospital.  Stool was FOBT+, melena.  EGD, colonoscopy, and capsule endoscopy were all done without clear source of blood loss.  While in the hospital, she was noted to go into atrial fibrillation with RVR.  She initially required amiodarone for rate control, but was transitioned over to Toprol XL. She was not anticoagulated due to concern for GI blood loss and anemia.    She returns for followup today.  She is short of breath walking about 50 feet.  No orthopnea/PND.  No chest pain.  She does not feel palpitations and HR is controlled.  Weight is up at home.    ECG: atrial fibrillation, old ASMI  Labs 02/05/13 K 3.8 Creatinine 1.37 05/08/13 K 3.8, Creatinine 1.58 11/14 K 4.1, creatinine 1.82 08/09/2014: K 4.0 Creatinine 1.45  5/16: K 3.7, creatinine 1.76, BNP 447 6/16: K 5 => 4.6, creatinine 2.09 => 2.13, BNP 291 8/16: K 4.7, creatinine 2.1, HCT 30.7 9/16: K 4.5, creatinine 2.3, HCT 34.1 1/17: K 4.8, creatinine 1.84, HCT 29.7, plts 136, FOBT+  Current Outpatient Prescriptions on File Prior to Encounter  Medication Sig Dispense Refill  . allopurinol (ZYLOPRIM) 300 MG tablet Take 300 mg by mouth daily.    [name] ALPRAZolam (XANAX) 0.25 MG tablet Take 0.25 mg by mouth at bedtime.     [name] atorvastatin (LIPITOR) 10 MG tablet Take 1 tablet (10 mg total) by mouth daily. 90 tablet 3  . b complex vitamins tablet Take 1 tablet by mouth daily.    [name] FLUOCINOLONE ACETONIDE BODY 0.01 % OIL Apply 1 mL topically 2 (two) times daily as needed (for flares).   3  .  hydroxychloroquine (PLAQUENIL) 200 MG tablet Take 200 mg by mouth 2 (two) times daily.    [name] ketoconazole (NIZORAL) 2 % shampoo Apply 1 application topically every 14 (fourteen) days. Leave on 5 minutes before rinsing out  3  . lidocaine (LIDODERM) 5 % Place 1 patch onto the skin daily as needed (for pain).     . Omega-3 Fatty Acids (FISH OIL) 1200 MG CAPS Take 1,200 mg by mouth daily.    . OXYGEN Inhale 4-5 L into the lungs continuous.     [name] acetaminophen (  TYLENOL) 325 MG tablet Take 2 tablets (650 mg total) by mouth every 6 (six) hours as needed for mild pain (or Fever >/= 101). (Patient not taking: Reported on 09/05/2015)     No current facility-administered medications on file prior to encounter.   Social History   Social History  . Marital Status: Legally Separated    Spouse Name: N/A  . Number of Children: 1  . Years of Education: N/A   Occupational History  . Disability     Office Work   Social History Main Topics  . Smoking status: Former Smoker -- 1.50 packs/day for 14 years    Types: Cigarettes    Quit date: 08/09/2003  . Smokeless tobacco: Never Used  . Alcohol Use: No  . Drug Use: No  . Sexual Activity: Not Currently   Other Topics Concern  . None   Social History Narrative   Family History  Problem Relation Age of Onset  . Heart attack Mother   . Prostate cancer Father   . Diabetes Brother   . Kidney failure Brother     Filed Vitals:   09/05/15 0858  BP: 104/60  Pulse: 61  Weight: 221 lb 8 oz (100.472 kg)  SpO2: 97%   Physical Exam Gen: Pleasant, well-nourished, in no distress, normal affect; on O2 by [place].  HEENT: normal Neck: JVP 10-12 cm. Lungs: Crackles at bases bilaterally.  Cardiovascular: well healed sternotomy scar  PMI normal. Irregular S1S2, no S3/S4, 3/6 HSM LLSB/apex.  Extremities: No deformities, no cyanosis or clubbing, 1+ ankle edema.  Neuro: alert, non focal. Cranial nerves intact. Moves all 4 extremities without difficulty Skin:  Warm, no lesions or rashes  Assessment/Plan: 1. 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  This appeared to be primarily pulmonary venous hypertension.  Interestingly, there were very prominent V waves in the PCWP tracing. On exam, she has a prominent MR murmur.  I was concerned that significant MR may be playing a major role in her symptomatology. I did a TEE in 6/16 showing very eccentric, posteriorly-directed mitral regurgitation possibly from subtle anterior leaflet prolapse.  I think that the MR could be severe but is difficult to fully visualize.  Her pulmonologist does not think that lung parenchymal disease adequately explains her degree of hypoxemia and dyspnea. She was seen at [facility] for consideration of percutaneous MV clip and was not thought to be a good candidate => concern that MV disease may not actually be severe and concern that her dyspnea is coming from something else and would not be improved by MV clipping.  Most recent echo in 1/17 showed normal LV EF and only visualized mild to moderate MR.  On exam today, she is volume overloaded in the setting of relatively new atrial fibrillation.  NYHA class III symptoms. - Increase Lasix to 80 qam/40 qpm.  BMET/BNP today, and repeat in 1 week.  2. Chronic respiratory failure:  She is on 4-5 L home oxygen by [place].  I have been concerned that she does have significant underlying lung parenchymal disease.  However, her pulmonologist (Dr Melvyn Novas),  did not think she had a significant degree of obstructive lung disease on last PFTs.  He thought that the restriction was primarily due to her body habitus.  3. OSA: Continue CPAP nightly.  4. CKD: Stage IV.  She has been referred to nephrology.   5. COPD: See #2 above,  per pulmonary.     6. Aortic dissection s/p repair 2005: Although complete records are not available, it appears  that the patient developed a Type A dissection in 2005 with ascending aorta repair (can see the surgically repaired ascending aorta well on TEE).  She has residual dissection in the descending thoracic aorta.  7. Pulmonary hypertension: I am concerned that this is primarily WHO group 2 (pulmonary venous hypertension) and group 3 (PH due to intrinsic lung disease) rather than primarily WHO group 1 PAH. RHC in 1/16 showed low PVR.  She is now off macitentan and actually feels better off it.  May need repeat RHC in the future.  8. Mitral regurgitation:  TEE concerning for possibly severe, eccentric mitral regurgitation, most likely due to subtle prolapse of the anterior mitral valve leaflet.  I have been concerned that the MR is playing a role in her CHF. She had prominent V-waves on RHC in 1/16, and she has a loud mitral area murmur.  I do not think that she is a good candidate for open MV surgery. I had her evaluated at [facility] for possible percutaneous mitral clipping.  She was turned down for this as her MR was not clearly severe and fixing it may not help her hypoxemia appreciably.  Of note, most recent echo in 1/17 actually visualized only mild to moderate MR.  9. Anemia: Suspect anemia of chronic disease/renal disease but also with component of GI blood loss. Recent admission requiring 5 units PRBCs.  She was FOBT+, but unable to visualized bleeding source on EGD, colonoscopy, or capsule endoscopy.  - Add back her oral iron. - CBC today.   - needs followup with hematology.  10. Atrial fibrillation: Persistent.  Rate is controlled.  I suspect that this has led to worsening of her CHF.  She is not anticoagulated given recent admission for severe anemia and need for 5 units PRBCs.  Site of blood loss could not be visualized on GI evaluation.  Follow CBCs over the next few weeks.  If she stays stable, could consider trial of anticoagulation (CHADSVASC = 3).  However, if hemoglobin remains low, could alternatively  consider Watchman placement.  Sedation, however, would be difficult.   [name] 09/05/2015

## 2015-09-06 ENCOUNTER — Telehealth: Payer: Self-pay | Admitting: Hematology

## 2015-09-06 ENCOUNTER — Other Ambulatory Visit: Payer: Self-pay | Admitting: Hematology

## 2015-09-06 DIAGNOSIS — D649 Anemia, unspecified: Secondary | ICD-10-CM

## 2015-09-06 DIAGNOSIS — D469 Myelodysplastic syndrome, unspecified: Secondary | ICD-10-CM

## 2015-09-06 NOTE — Telephone Encounter (Signed)
Patient called in to reschedule her missed appointments from when she was in the hospital  April Mueller

## 2015-09-07 ENCOUNTER — Telehealth: Payer: Self-pay | Admitting: Hematology

## 2015-09-07 ENCOUNTER — Other Ambulatory Visit: Payer: Self-pay

## 2015-09-07 DIAGNOSIS — Z1231 Encounter for screening mammogram for malignant neoplasm of breast: Secondary | ICD-10-CM

## 2015-09-07 NOTE — Telephone Encounter (Signed)
per pof to sch pt appt-pt to get updated copy on 2/2 @ appt

## 2015-09-08 ENCOUNTER — Telehealth: Payer: Self-pay | Admitting: *Deleted

## 2015-09-08 ENCOUNTER — Telehealth: Payer: Self-pay | Admitting: Hematology

## 2015-09-08 ENCOUNTER — Other Ambulatory Visit (HOSPITAL_BASED_OUTPATIENT_CLINIC_OR_DEPARTMENT_OTHER): Payer: Medicare Other

## 2015-09-08 ENCOUNTER — Encounter: Payer: Self-pay | Admitting: Hematology

## 2015-09-08 ENCOUNTER — Ambulatory Visit (HOSPITAL_BASED_OUTPATIENT_CLINIC_OR_DEPARTMENT_OTHER): Payer: Medicare Other | Admitting: Hematology

## 2015-09-08 ENCOUNTER — Ambulatory Visit: Payer: Medicare Other

## 2015-09-08 ENCOUNTER — Other Ambulatory Visit: Payer: Self-pay | Admitting: *Deleted

## 2015-09-08 VITALS — BP 103/44 | HR 50 | Temp 98.6°F | Resp 18 | Ht 64.0 in | Wt 219.3 lb

## 2015-09-08 DIAGNOSIS — D638 Anemia in other chronic diseases classified elsewhere: Secondary | ICD-10-CM | POA: Diagnosis not present

## 2015-09-08 DIAGNOSIS — D469 Myelodysplastic syndrome, unspecified: Secondary | ICD-10-CM

## 2015-09-08 DIAGNOSIS — M069 Rheumatoid arthritis, unspecified: Secondary | ICD-10-CM | POA: Diagnosis not present

## 2015-09-08 DIAGNOSIS — D631 Anemia in chronic kidney disease: Secondary | ICD-10-CM

## 2015-09-08 DIAGNOSIS — N189 Chronic kidney disease, unspecified: Secondary | ICD-10-CM

## 2015-09-08 DIAGNOSIS — D500 Iron deficiency anemia secondary to blood loss (chronic): Secondary | ICD-10-CM

## 2015-09-08 LAB — CBC & DIFF AND RETIC
BASO%: 0.5 % (ref 0.0–2.0)
BASOS ABS: 0 10*3/uL (ref 0.0–0.1)
EOS%: 1.9 % (ref 0.0–7.0)
Eosinophils Absolute: 0.1 10*3/uL (ref 0.0–0.5)
HEMATOCRIT: 33.3 % — AB (ref 34.8–46.6)
HEMOGLOBIN: 10 g/dL — AB (ref 11.6–15.9)
Immature Retic Fract: 5.3 % (ref 1.60–10.00)
LYMPH%: 12.7 % — ABNORMAL LOW (ref 14.0–49.7)
MCH: 30.1 pg (ref 25.1–34.0)
MCHC: 30 g/dL — ABNORMAL LOW (ref 31.5–36.0)
MCV: 100.3 fL (ref 79.5–101.0)
MONO#: 0.3 10*3/uL (ref 0.1–0.9)
MONO%: 5.3 % (ref 0.0–14.0)
NEUT#: 4.9 10*3/uL (ref 1.5–6.5)
NEUT%: 79.6 % — ABNORMAL HIGH (ref 38.4–76.8)
NRBC: 0 % (ref 0–0)
Platelets: 143 10*3/uL — ABNORMAL LOW (ref 145–400)
RBC: 3.32 10*6/uL — ABNORMAL LOW (ref 3.70–5.45)
RDW: 17 % — AB (ref 11.2–14.5)
Retic %: 1.9 % (ref 0.70–2.10)
Retic Ct Abs: 63.08 10*3/uL (ref 33.70–90.70)
WBC: 6.2 10*3/uL (ref 3.9–10.3)
lymph#: 0.8 10*3/uL — ABNORMAL LOW (ref 0.9–3.3)

## 2015-09-08 LAB — FERRITIN: FERRITIN: 172 ng/mL (ref 9–269)

## 2015-09-08 MED ORDER — DARBEPOETIN ALFA 100 MCG/0.5ML IJ SOSY
40.0000 ug | PREFILLED_SYRINGE | Freq: Once | INTRAMUSCULAR | Status: DC
  Filled 2015-09-08: qty 0.5

## 2015-09-08 MED ORDER — DARBEPOETIN ALFA 100 MCG/0.5ML IJ SOSY
40.0000 ug | PREFILLED_SYRINGE | Freq: Once | INTRAMUSCULAR | Status: AC
  Administered 2015-09-08: 40 ug via SUBCUTANEOUS
  Filled 2015-09-08: qty 0.5

## 2015-09-08 NOTE — Progress Notes (Signed)
Met with patient who had some financial concerns regarding ov copays. Advised patient there is no assistance available for this type of copay. Reviewed patient's billing and she owes $25 for copay at this time.Patient just wanted to make sure that she will not be refused treatment because of inability to pay. I assured patient that this would not happen. Patient will be coming every 7 days for treatment. Screened patient for [employer]. Patient approved for $400 [employer]. Patient has a copy of award and expense coverage form. Patient asked about medications she could use the grant for. I advised patient that it would only cover medication that Dr.Kale has written. I  explained how grant works and uses. Patient was  very Patent attorney. Patient has my card for any additional financial questions or concerns.

## 2015-09-08 NOTE — Telephone Encounter (Signed)
per pof to sch pt appt-gave pt copy of avs-sent MW email to sch trmt °

## 2015-09-08 NOTE — Patient Instructions (Signed)
Darbepoetin Alfa injection What is this medicine? DARBEPOETIN ALFA (dar be POE e tin AL fa) helps your body make more red blood cells. It is used to treat anemia caused by chronic kidney failure and chemotherapy. This medicine may be used for other purposes; ask your health care provider or pharmacist if you have questions. What should I tell my health care provider before I take this medicine? They need to know if you have any of these conditions: -blood clotting disorders or history of blood clots -cancer patient not on chemotherapy -cystic fibrosis -heart disease, such as angina, heart failure, or a history of a heart attack -hemoglobin level of 12 g/dL or greater -high blood pressure -low levels of folate, iron, or vitamin B12 -seizures -an unusual or allergic reaction to darbepoetin, erythropoietin, albumin, hamster proteins, latex, other medicines, foods, dyes, or preservatives -pregnant or trying to get pregnant -breast-feeding How should I use this medicine? This medicine is for injection into a vein or under the skin. It is usually given by a health care professional in a hospital or clinic setting. If you get this medicine at home, you will be taught how to prepare and give this medicine. Do not shake the solution before you withdraw a dose. Use exactly as directed. Take your medicine at regular intervals. Do not take your medicine more often than directed. It is important that you put your used needles and syringes in a special sharps container. Do not put them in a trash can. If you do not have a sharps container, call your pharmacist or healthcare provider to get one. Talk to your pediatrician regarding the use of this medicine in children. While this medicine may be used in children as young as 1 year for selected conditions, precautions do apply. Overdosage: If you think you have taken too much of this medicine contact a poison control center or emergency room at once. NOTE:  This medicine is only for you. Do not share this medicine with others. What if I miss a dose? If you miss a dose, take it as soon as you can. If it is almost time for your next dose, take only that dose. Do not take double or extra doses. What may interact with this medicine? Do not take this medicine with any of the following medications: -epoetin alfa This list may not describe all possible interactions. Give your health care provider a list of all the medicines, herbs, non-prescription drugs, or dietary supplements you use. Also tell them if you smoke, drink alcohol, or use illegal drugs. Some items may interact with your medicine. What should I watch for while using this medicine? Visit your prescriber or health care professional for regular checks on your progress and for the needed blood tests and blood pressure measurements. It is especially important for the doctor to make sure your hemoglobin level is in the desired range, to limit the risk of potential side effects and to give you the best benefit. Keep all appointments for any recommended tests. Check your blood pressure as directed. Ask your doctor what your blood pressure should be and when you should contact him or her. As your body makes more red blood cells, you may need to take iron, folic acid, or vitamin B supplements. Ask your doctor or health care provider which products are right for you. If you have kidney disease continue dietary restrictions, even though this medication can make you feel better. Talk with your doctor or health care professional about the   foods you eat and the vitamins that you take. What side effects may I notice from receiving this medicine? Side effects that you should report to your doctor or health care professional as soon as possible: -allergic reactions like skin rash, itching or hives, swelling of the face, lips, or tongue -breathing problems -changes in vision -chest pain -confusion, trouble speaking  or understanding -feeling faint or lightheaded, falls -high blood pressure -muscle aches or pains -pain, swelling, warmth in the leg -rapid weight gain -severe headaches -sudden numbness or weakness of the face, arm or leg -trouble walking, dizziness, loss of balance or coordination -seizures (convulsions) -swelling of the ankles, feet, hands -unusually weak or tired Side effects that usually do not require medical attention (report to your doctor or health care professional if they continue or are bothersome): -diarrhea -fever, chills (flu-like symptoms) -headaches -nausea, vomiting -redness, stinging, or swelling at site where injected This list may not describe all possible side effects. Call your doctor for medical advice about side effects. You may report side effects to FDA at 1-800-FDA-1088. Where should I keep my medicine? Keep out of the reach of children. Store in a refrigerator between 2 and 8 degrees C (36 and 46 degrees F). Do not freeze. Do not shake. Throw away any unused portion if using a single-dose vial. Throw away any unused medicine after the expiration date. NOTE: This sheet is a summary. It may not cover all possible information. If you have questions about this medicine, talk to your doctor, pharmacist, or health care provider.    2016, [employer]vier/[employer]. (2008-07-06 10:23:57)  

## 2015-09-08 NOTE — Telephone Encounter (Signed)
Per staff message and POF I have scheduled appts. Advised scheduler of appts. JMW  

## 2015-09-09 ENCOUNTER — Ambulatory Visit: Payer: Medicare Other

## 2015-09-09 ENCOUNTER — Telehealth: Payer: Self-pay | Admitting: Hematology

## 2015-09-09 LAB — KAPPA/LAMBDA LIGHT CHAINS
IG KAPPA FREE LIGHT CHAIN: 112.15 mg/L — AB (ref 3.30–19.40)
Ig Lambda Free Light Chain: 50.16 mg/L — ABNORMAL HIGH (ref 5.71–26.30)
Kappa/Lambda FluidC Ratio: 2.24 — ABNORMAL HIGH (ref 0.26–1.65)

## 2015-09-09 NOTE — Telephone Encounter (Signed)
per pof to sch pt appt-cld pt afterreply to adv of fera appt 2/3 @2 

## 2015-09-12 ENCOUNTER — Encounter (HOSPITAL_COMMUNITY): Payer: Self-pay

## 2015-09-12 ENCOUNTER — Ambulatory Visit (HOSPITAL_COMMUNITY)
Admission: RE | Admit: 2015-09-12 | Discharge: 2015-09-12 | Disposition: A | Discharge status: Home / Self Care | Payer: Medicare Other | Source: Ambulatory Visit | Attending: Cardiology | Admitting: Cardiology

## 2015-09-12 VITALS — BP 91/50 | HR 60 | Resp 18 | Wt 217.0 lb

## 2015-09-12 DIAGNOSIS — I481 Persistent atrial fibrillation: Secondary | ICD-10-CM | POA: Diagnosis not present

## 2015-09-12 DIAGNOSIS — Z79899 Other long term (current) drug therapy: Secondary | ICD-10-CM | POA: Diagnosis not present

## 2015-09-12 DIAGNOSIS — I480 Paroxysmal atrial fibrillation: Secondary | ICD-10-CM | POA: Diagnosis not present

## 2015-09-12 DIAGNOSIS — IMO0002 Reserved for concepts with insufficient information to code with codable children: Secondary | ICD-10-CM

## 2015-09-12 DIAGNOSIS — Z8249 Family history of ischemic heart disease and other diseases of the circulatory system: Secondary | ICD-10-CM | POA: Diagnosis not present

## 2015-09-12 DIAGNOSIS — E669 Obesity, unspecified: Secondary | ICD-10-CM | POA: Diagnosis not present

## 2015-09-12 DIAGNOSIS — J449 Chronic obstructive pulmonary disease, unspecified: Secondary | ICD-10-CM | POA: Diagnosis not present

## 2015-09-12 DIAGNOSIS — I272 Other secondary pulmonary hypertension: Secondary | ICD-10-CM | POA: Insufficient documentation

## 2015-09-12 DIAGNOSIS — M069 Rheumatoid arthritis, unspecified: Secondary | ICD-10-CM | POA: Insufficient documentation

## 2015-09-12 DIAGNOSIS — G4733 Obstructive sleep apnea (adult) (pediatric): Secondary | ICD-10-CM | POA: Diagnosis not present

## 2015-09-12 DIAGNOSIS — J9610 Chronic respiratory failure, unspecified whether with hypoxia or hypercapnia: Secondary | ICD-10-CM | POA: Insufficient documentation

## 2015-09-12 DIAGNOSIS — I340 Nonrheumatic mitral (valve) insufficiency: Secondary | ICD-10-CM | POA: Insufficient documentation

## 2015-09-12 DIAGNOSIS — I5032 Chronic diastolic (congestive) heart failure: Secondary | ICD-10-CM | POA: Insufficient documentation

## 2015-09-12 DIAGNOSIS — Z9981 Dependence on supplemental oxygen: Secondary | ICD-10-CM | POA: Insufficient documentation

## 2015-09-12 DIAGNOSIS — Z841 Family history of disorders of kidney and ureter: Secondary | ICD-10-CM | POA: Insufficient documentation

## 2015-09-12 DIAGNOSIS — D649 Anemia, unspecified: Secondary | ICD-10-CM | POA: Insufficient documentation

## 2015-09-12 DIAGNOSIS — Z833 Family history of diabetes mellitus: Secondary | ICD-10-CM | POA: Diagnosis not present

## 2015-09-12 DIAGNOSIS — Z87891 Personal history of nicotine dependence: Secondary | ICD-10-CM | POA: Diagnosis not present

## 2015-09-12 DIAGNOSIS — N184 Chronic kidney disease, stage 4 (severe): Secondary | ICD-10-CM | POA: Diagnosis not present

## 2015-09-12 DIAGNOSIS — N183 Chronic kidney disease, stage 3 unspecified: Secondary | ICD-10-CM

## 2015-09-12 LAB — MULTIPLE MYELOMA PANEL, SERUM
Albumin SerPl Elph-Mcnc: 3.3 g/dL (ref 2.9–4.4)
Albumin/Glob SerPl: 1.1 (ref 0.7–1.7)
Alpha 1: 0.3 g/dL (ref 0.0–0.4)
Alpha2 Glob SerPl Elph-Mcnc: 0.6 g/dL (ref 0.4–1.0)
B-GLOBULIN SERPL ELPH-MCNC: 1.1 g/dL (ref 0.7–1.3)
GAMMA GLOB SERPL ELPH-MCNC: 1.3 g/dL (ref 0.4–1.8)
GLOBULIN, TOTAL: 3.3 g/dL (ref 2.2–3.9)
IgA, Qn, Serum: 359 mg/dL — ABNORMAL HIGH (ref 87–352)
IgG, Qn, Serum: 1467 mg/dL (ref 700–1600)
IgM, Qn, Serum: 88 mg/dL (ref 26–217)
TOTAL PROTEIN: 6.6 g/dL (ref 6.0–8.5)

## 2015-09-12 LAB — BASIC METABOLIC PANEL
ANION GAP: 11 (ref 5–15)
BUN: 25 mg/dL — AB (ref 6–20)
CALCIUM: 10.2 mg/dL (ref 8.9–10.3)
CO2: 29 mmol/L (ref 22–32)
Chloride: 105 mmol/L (ref 101–111)
Creatinine, Ser: 1.89 mg/dL — ABNORMAL HIGH (ref 0.44–1.00)
GFR calc Af Amer: 31 mL/min — ABNORMAL LOW (ref >60)
GFR, EST NON AFRICAN AMERICAN: 26 mL/min — AB (ref >60)
Glucose, Bld: 134 mg/dL — ABNORMAL HIGH (ref 65–99)
POTASSIUM: 3.5 mmol/L (ref 3.5–5.1)
SODIUM: 145 mmol/L (ref 135–145)

## 2015-09-12 LAB — BRAIN NATRIURETIC PEPTIDE: B NATRIURETIC PEPTIDE 5: 329 pg/mL — AB (ref 0.0–100.0)

## 2015-09-12 MED ORDER — FUROSEMIDE 80 MG PO TABS: 80.0000 mg | ORAL_TABLET | Freq: Two times a day (BID) | ORAL | Status: DC

## 2015-09-12 MED ORDER — POTASSIUM CHLORIDE CRYS ER 20 MEQ PO TBCR: 20.0000 meq | EXTENDED_RELEASE_TABLET | Freq: Every day | ORAL | Status: DC

## 2015-09-12 NOTE — Progress Notes (Signed)
Patient ID: April Mueller, female   DOB: [birth year], 70 y.o.   MRN: [id number]   HPI  PCP: Dr Harrington Challenger Pulmonary: Dr Byrum/Dr Melvyn Novas   April Mueller is a 70 yo with following PMHx  1) COPD       --former smoker (28 pk-yrs)       --on home O2 - 4L/min rest, up to 5L/min exertion.        --spirometry 6/13 in [place] = 0.85L FVC = 1.3L no DLCO measured. FEV1 in 2014 = 0.73        --CT chest 10/14 - moderate centrilobular emphysema. No pulm fibrosis but there was evidence for post-infectious/inflammatory scarring. Stable Asc Ao repair       --PFTs (11/14) FVC 46%, FEV1 0.8L (40%), ratio 87%, FEF 25-75% 0.44L DLCO 22%, TLC 50% => PFTs in 06/2013 did NOT suggest significant airflow obstruction according to Dr Gustavus Bryant last note despite "moderate emphysema" on CT.        --PFTs (6/16) were abnormal with severe obstruction, severe restriction, severely decreased DLCO.  Reviewed with Dr Melvyn Novas => he thinks obstruction is actually fairly minimal and that the restriction is due to body habitus.   2) Obesity 3) RA       --on plaquenil, sees Dr Lenna Gilford.  4) Type A aortic dissection s/p repair 2005     --c/b renal failure requiring HD x 3 months 5) PAH - previously treated in [place].  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      --on macitentan (revatio stopped 10/14).     --V/Q scan 11/14 no chronic PE     -- 2015 Tracleer stopped and started on macitentan. 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  6) OSA 7) Diastolic CHF    --ECHO [id number] EF 55-60% RV mildly dilated with normal function. Mild to moderate posterior MR. RVSP 56. Probable restrictive diastolic filling pattern    --ECHO 9/15 EF 60-65% RV mildly dilated with normal function. Mild to moderate posterior MR. RVSP 40. Ao Root ok    --TEE 6/16 with EF 55-60%, D-shaped interventricular septum suggestive of RV pressure/volume overload, RV mildly dilated with normal systolic function, peak RV-RA gradient 57 mmHg, very eccentric  posteriorly-directed mitral regurgitation, possibly severe, etiology may be a very small area of prolapse on the anterior leaflet, s/p ascending aorta repair with residual dissection flap in the descending thoracic aorta.       --Echo (1/17) with EF 55-60%, mild to moderate MR, PASP 66 8) CKD 9) RHC 08/18/2014: No role for pulmonary vasodilators.  RA = 14 RV = 62/3/15 PA = 63/21 (38) PCW = mean 23 v waves to 40 Fick cardiac output/index = 6.3/3.2 Thermodilution CO/CI = 6.1/3.1, PVR = 2.0 WU, FA sat = 91% PA sat = 57%, 62% 10) Mitral regurgitation: Possibly severe by 6/16 TEE, very eccentric.  Not good candidate for percutaneous MV clip (seen at [facility]).  Echo (1/17) with only mild to moderate MR.  11) Anemia: Suspect from chronic disease/renal disease but has been found to be FOBT+.  EGD/colonoscopy/capsule endoscopy in 1/17 did not show any definite site for blood loss. 12) Atrial fibrillation: Persistent, 1st noted in 1/17.   Moved back from [place] and saw Dr. Lamonte Sakai. In [place] was followed by cardiology and pulmonary. Started on Tracleer in 2006. Revatio was added, however was discontinued without any change in her symptoms. Stopped smoking in January 2005 when she had aortic aneurysm repair. Smoked 1.5-2 ppd x 15 -  20 years.  Now on macitentan.  I had her do a TEE in 6/16.  She had a loud MR murmur.  TEE showed very eccentric, posteriorly-directed MR that may be from subtle prolapse of the anterior mitral leaflet.  I am concerned that the MR could be severe but very difficult to fully visualize.  She additionally had PFTs done that were abnormal, but according to her pulmonologist Melvyn Novas), the obstruction was really only minimal and the restriction was primarily related to body habitus.  He does not think that we can explain her hypoxemia and dyspnea primarily from parenchymal lung pathology.  I sent her to [facility] for evaluation for percutaneous mitral valve clip.  They did not think that she was a good candidate.    She has been anemic and received 1 unit PRBCs in 8/16.  She saw GI and was noted to be heme negative, no scopes were done (heme negative and high risk). She has had iron infusions.   Opsumit was stopped in 9/16 due to suspicion for group 2 pulmonary hypertension only (low PVR) and she felt better off it.   She was admitted in 1/17 with increased dyspnea and was found to be profoundly anemic.  She had a total of 5 units PRBCs and an iron infusion in the hospital.  Stool was FOBT+, melena.  EGD, colonoscopy, and capsule endoscopy were all done without clear source of blood loss.  While in the hospital, she was noted to go into atrial fibrillation with RVR.  She initially required amiodarone for rate control, but was transitioned over to Toprol XL. She was not anticoagulated due to concern for GI blood loss and anemia.    She returns for followup today.  At last appointment, she remained in atrial fibrillation. I increased Lasix to 80 qam/40 qpm.  Weight is down 4 lbs. She remains short of breath after walking about 100 feet, but this is improved. No BRBPR or melena currently.  No chest pain.  Stable orthopnea.  Getting monthly IV iron via hematology.    Labs 02/05/13 K 3.8 Creatinine 1.37 05/08/13 K 3.8, Creatinine 1.58 11/14 K 4.1, creatinine 1.82 08/09/2014: K 4.0 Creatinine 1.45  5/16: K 3.7, creatinine 1.76, BNP 447 6/16: K 5 => 4.6, creatinine 2.09 => 2.13, BNP 291 8/16: K 4.7, creatinine 2.1, HCT 30.7 9/16: K 4.5, creatinine 2.3, HCT 34.1 1/17: K 4.8, creatinine 1.84 => 1.87, HCT 29.7, plts 136, FOBT+, BNP 403  ROS: All systems reviewed and negative except as per HPI.  Current Outpatient Prescriptions on File Prior to Encounter  Medication Sig Dispense Refill  . acetaminophen (TYLENOL) 325 MG tablet Take 2 tablets (650 mg total) by mouth every 6 (six) hours as needed for mild pain (or Fever >/= 101).    [name] allopurinol (ZYLOPRIM) 300 MG tablet Take 300 mg by mouth daily.    [name] ALPRAZolam  (XANAX) 0.25 MG tablet Take 0.25 mg by mouth at bedtime.     [name] atorvastatin (LIPITOR) 10 MG tablet Take 1 tablet (10 mg total) by mouth daily. 90 tablet 3  . b complex vitamins tablet Take 1 tablet by mouth daily.    [name] FLUOCINOLONE ACETONIDE BODY 0.01 % OIL Apply 1 mL topically 2 (two) times daily as needed (for flares).   3  . hydroxychloroquine (PLAQUENIL) 200 MG tablet Take 200 mg by mouth 2 (two) times daily.    [name] ketoconazole (NIZORAL) 2 % shampoo Apply 1 application topically every 14 (fourteen) days. Leave on  5 minutes before rinsing out  3  . lidocaine (LIDODERM) 5 % Place 1 patch onto the skin daily as needed (for pain).     . metoprolol succinate (TOPROL-XL) 25 MG 24 hr tablet Take 1 tablet (25 mg total) by mouth 2 (two) times daily. 180 tablet 1  . Omega-3 Fatty Acids (FISH OIL) 1200 MG CAPS Take 1,200 mg by mouth daily.    . OXYGEN Inhale 4-5 L into the lungs continuous.      No current facility-administered medications on file prior to encounter.   Social History   Social History  . Marital Status: Legally Separated    Spouse Name: N/A  . Number of Children: 1  . Years of Education: N/A   Occupational History  . Disability     Office Work   Social History Main Topics  . Smoking status: Former Smoker -- 1.50 packs/day for 14 years    Types: Cigarettes    Quit date: 08/09/2003  . Smokeless tobacco: Never Used  . Alcohol Use: No  . Drug Use: No  . Sexual Activity: Not Currently   Other Topics Concern  . None   Social History Narrative   Family History  Problem Relation Age of Onset  . Heart attack Mother   . Prostate cancer Father   . Diabetes Brother   . Kidney failure Brother     Filed Vitals:   09/12/15 0956  BP: 91/50  Pulse: 60  Resp: 18  Weight: 217 lb (98.431 kg)  SpO2: 100%   Physical Exam Gen: Pleasant, well-nourished, in no distress, normal affect; on O2 by [place].  HEENT: normal Neck: JVP 10 cm. Lungs: Crackles at bases bilaterally.   Cardiovascular: well healed sternotomy scar  PMI normal. Irregular S1S2, no S3/S4, 3/6 HSM LLSB/apex.  Extremities: No deformities, no cyanosis or clubbing, 1+ ankle edema.  Neuro: alert, non focal. Cranial nerves intact. Moves all 4 extremities without difficulty Skin: Warm, no lesions or rashes  Assessment/Plan: 1. 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  This appeared to be primarily pulmonary venous hypertension.  Interestingly, there were very prominent V waves in the PCWP tracing. On exam, she has a prominent MR murmur.  I was concerned that significant MR may be playing a major role in her symptomatology. I did a TEE in 6/16 showing very eccentric, posteriorly-directed mitral regurgitation possibly from subtle anterior leaflet prolapse.  I think that the MR could be severe but is difficult to fully visualize.  Her pulmonologist does not think that lung parenchymal disease adequately explains her degree of hypoxemia and dyspnea. She was seen at [facility] for consideration of percutaneous MV clip and was not thought to be a good candidate => concern that MV disease may not actually be severe and concern that her dyspnea is coming from something else and would not be improved by MV clipping.  Most recent echo in 1/17 showed normal LV EF and only visualized mild to moderate MR.  She has been volume overloaded recently in the setting of the onset of persistent atrial fibrillation.  Lasix was increased at last appointment.  She remains volume overloaded today but is improved.  NYHA class III symptoms. - Increase Lasix to 80 mg bid.  BMET/BNP today, and repeat in 10 days. - Add KCl 20 daily.   2. Chronic respiratory failure:  She is on 4-5 L home oxygen by [place].  I have been concerned that she does have significant underlying lung parenchymal disease.  However, her pulmonologist (Dr  Melvyn Novas),  did not think she had a significant degree of obstructive lung disease on last PFTs.  He thought that the restriction was primarily due to her body habitus.  3. OSA: Continue CPAP nightly.  4. CKD: Stage IV.  She has been referred to nephrology.   5. COPD: See #2 above, per pulmonary.     6. Aortic dissection s/p repair 2005: Although complete records are not available, it appears that the patient developed a Type A dissection in 2005 with ascending aorta repair (can see the surgically repaired ascending aorta well on TEE).  She has residual dissection in the descending thoracic aorta.  7. Pulmonary hypertension: I am concerned that this is primarily WHO group 2 (pulmonary venous hypertension) and group 3 (PH due to intrinsic lung disease) rather than primarily WHO group 1 PAH. RHC in 1/16 showed low PVR.  She is now off macitentan without feeling any worse after stopping it.  I will likely arrange eventual repeat RHC catheterization (would like to see her adequately diuresed first).  8. Mitral regurgitation:  TEE concerning for possibly severe, eccentric mitral regurgitation, most likely due to subtle prolapse of the anterior mitral valve leaflet.  I have been concerned that the MR is playing a role in her CHF. She had prominent V-waves on RHC in 1/16, and she has a loud mitral area murmur.  I do not think that she is a good candidate for open MV surgery. I had her evaluated at [facility] for possible percutaneous mitral clipping.  She was turned down for this as her MR was not clearly severe and fixing it may not help her hypoxemia appreciably.  Of note, most recent echo in 1/17 actually visualized only mild to moderate MR.  9. Anemia: Suspect anemia of chronic disease/renal disease but also with component of GI blood loss. Recent admission requiring 5 units PRBCs.  She was FOBT+, but unable to visualized bleeding source on EGD, colonoscopy, or capsule endoscopy.  Seeing hematology, IV iron monthly.  10.  Atrial fibrillation: Persistent.  Rate is controlled.  I suspect that this has led to worsening of her CHF.  She is not anticoagulated given recent admission for severe anemia and need for 5 units PRBCs.  Site of blood loss could not be visualized on GI evaluation.  CHADSVASC =3.   - I would consider her for Watchman placement to avoid the need for long-term anticoagulation.  However, she would need general anesthesia for this.  She will see her pulmonologist Friday. I think that from a cardiac standpoint she could get anesthesia, but would also like pulmonary opinion.  - If she gets Watchman, she will go on Coumadin for about 45 days.  I would arrange for DCCV attempt while on coumadin. Would avoid amiodarone with lung disease.    [name] 09/12/2015

## 2015-09-12 NOTE — Progress Notes (Signed)
April Mueller    HEMATOLOGY/ONCOLOGY CLINIC NOTE  Date of Service: .  Patient Care Team: Lona Kettle, MD as PCP - General (Family Medicine)  CHIEF COMPLAINTS/PURPOSE OF CONSULTATION: f/u for Anemia  Diagnosis: Normocytic Normochromic Anemia likely multifactorial - CKD/RA/GIB/MDS  Treatment: Aranesp + IV feraheme as needed  HISTORY OF PRESENTING ILLNESS: Please see my initial consultation for details on initial presentation.  INTERVAL HISTORY  [name] is here for her scheduled follow-up for her anemia. She notes that she was quite concerned with her recent admission to the hospital. She was hoping to see me in the hospital but I was unfortunately out of town. She presented with worsening DOE and dark stools. She was noted to have developed acute anemia due to GI bleeding. She received a total of 5 units of PRBCs and IV feraheme. She had an extensive GI workup with EGD, colonoscopy and capsule endoscopy which did not reveal a specific source of bleeding. hgb on discharge was 9.2 Patient is tearful about the overall burden of her multiple medical problems. She notes that she is trying to stay independent but it is quite difficult.  MEDICAL HISTORY:  Past Medical History  Diagnosis Date  . Hypertension   . Pulmonary HTN ([place])   . Aortic aneurysm ([place])   . Sleep apnea     wears CPAP  . CKD (chronic kidney disease) stage 3, GFR 30-59 ml/min   . Anemia 03/23/15    transfusion  . Arthritis   . CHF (congestive heart failure) ([place])   . Gout   . Rheumatoid arthritis(714.0) 11/06/2012    SURGICAL HISTORY: Past Surgical History  Procedure Laterality Date  . Abdominal aortic aneurysm repair    . Right heart catheterization N/A 08/18/2014    Procedure: RIGHT HEART CATH;  Surgeon: Jolaine Artist, MD;  Location[facility] CATH LAB;  Service: Cardiovascular;  Laterality: N/A;  . Tee without cardioversion N/A 01/05/2015    Procedure: TRANSESOPHAGEAL ECHOCARDIOGRAM (TEE);  Surgeon: Larey Dresser, MD;   Location: [place];  Service: Cardiovascular;  Laterality: N/A;  . Wisdom tooth extraction    . Givens capsule study N/A 08/21/2015    Procedure: GIVENS CAPSULE STUDY;  Surgeon: Gatha Mayer, MD;  Location: [place];  Service: Endoscopy;  Laterality: N/A;  . Esophagogastroduodenoscopy N/A 08/19/2015    Procedure: ESOPHAGOGASTRODUODENOSCOPY (EGD);  Surgeon: Jerene Bears, MD;  [facility] ENDOSCOPY;  Service: Endoscopy;  Laterality: N/A;  patient scheduled, anesthesia aware of 1500 case, per [place].  08/18/15 DP  . Colonoscopy N/A 08/19/2015    Procedure: COLONOSCOPY;  Surgeon: Jerene Bears, MD;  Location: [facility] ENDOSCOPY;  Service: Endoscopy;  Laterality: N/A;    SOCIAL HISTORY: Social History   Social History  . Marital Status: Legally Separated    Spouse Name: N/A  . Number of Children: 1  . Years of Education: N/A   Occupational History  . Disability     Office Work   Social History Main Topics  . Smoking status: Former Smoker -- 1.50 packs/day for 14 years    Types: Cigarettes    Quit date: 08/09/2003  . Smokeless tobacco: Never Used  . Alcohol Use: No  . Drug Use: No  . Sexual Activity: Not Currently   Other Topics Concern  . Not on file   Social History Narrative    FAMILY HISTORY: Family History  Problem Relation Age of Onset  . Heart attack Mother   . Prostate cancer Father   . Diabetes Brother   .  Kidney failure Brother     ALLERGIES:  has No Known Allergies.  MEDICATIONS:  Current Outpatient Prescriptions  Medication Sig Dispense Refill  . acetaminophen (TYLENOL) 325 MG tablet Take 2 tablets (650 mg total) by mouth every 6 (six) hours as needed for mild pain (or Fever >/= 101).    April Mueller allopurinol (ZYLOPRIM) 300 MG tablet Take 300 mg by mouth daily.    April Mueller ALPRAZolam (XANAX) 0.25 MG tablet Take 0.25 mg by mouth at bedtime.     April Mueller atorvastatin (LIPITOR) 10 MG tablet Take 1 tablet (10 mg total) by mouth daily. 90 tablet 3  . b complex vitamins tablet Take  1 tablet by mouth daily.    . ferrous sulfate 325 (65 FE) MG tablet Take 325 mg by mouth daily with breakfast.    . FLUOCINOLONE ACETONIDE BODY 0.01 % OIL Apply 1 mL topically 2 (two) times daily as needed (for flares).   3  . furosemide (LASIX) 40 MG tablet Take 2 tabs in AM and 1 tab in PM 30 tablet   . hydroxychloroquine (PLAQUENIL) 200 MG tablet Take 200 mg by mouth 2 (two) times daily.    April Mueller ketoconazole (NIZORAL) 2 % shampoo Apply 1 application topically every 14 (fourteen) days. Leave on 5 minutes before rinsing out  3  . lidocaine (LIDODERM) 5 % Place 1 patch onto the skin daily as needed (for pain).     . metoprolol succinate (TOPROL-XL) 25 MG 24 hr tablet Take 1 tablet (25 mg total) by mouth 2 (two) times daily. 180 tablet 1  . Omega-3 Fatty Acids (FISH OIL) 1200 MG CAPS Take 1,200 mg by mouth daily.    . OXYGEN Inhale 4-5 L into the lungs continuous.      Current Facility-Administered Medications  Medication Dose Route Frequency Provider Last Rate Last Dose  . Darbepoetin Alfa (ARANESP) injection 40 mcg  40 mcg Subcutaneous Once Brunetta Genera, MD        REVIEW OF SYSTEMS:    10 Point review of Systems was done is negative except as noted above.  PHYSICAL EXAMINATION: ECOG PERFORMANCE STATUS: 3 - Symptomatic, >50% confined to bed  . Filed Vitals:   09/08/15 1005  Height: 5\' 4"  (1.626 m)  Weight: 219 lb 4.8 oz (99.474 kg)   Filed Weights   09/08/15 1005  Weight: 219 lb 4.8 oz (99.474 kg)   .Body mass index is 37.62 kg/(m^2).  GENERAL:alert, in no acute distress and comfortable on [place] oxygen SKIN: skin color, texture, turgor are normal, no rashes or significant lesions EYES: normal, conjunctiva are pink and non-injected, sclera clear OROPHARYNX:no exudate, no erythema and lips, buccal mucosa, and tongue normal  NECK: supple, no JVD, thyroid normal size, non-tender, without nodularity LYMPH:  no palpable lymphadenopathy in the cervical, axillary or inguinal LUNGS:  clear to auscultation with normal respiratory effort HEART: regular rate & rhythm,  no murmurs and no lower extremity edema ABDOMEN: abdomen soft, non-tender, normoactive bowel sounds  Musculoskeletal: no cyanosis of digits and no clubbing  PSYCH: alert & oriented x 3 with fluent speech NEURO: no focal motor/sensory deficits  LABORATORY DATA:  I have reviewed the data as listed  . CBC Latest Ref Rng 09/08/2015 09/05/2015 08/25/2015  WBC 3.9 - 10.3 10e3/uL 6.2 6.6 9.6  Hemoglobin 11.6 - 15.9 g/dL 10.0(L) 10.1(L) 9.2(L)  Hematocrit 34.8 - 46.6 % 33.3(L) 33.1(L) 29.7(L)  Platelets 145 - 400 10e3/uL 143(L) 135(L) 136(L)     . CMP Latest Ref Rng  09/05/2015 08/25/2015 08/24/2015  Glucose 65 - 99 mg/dL 121(H) 94 95  BUN 6 - 20 mg/dL 28(H) 36(H) 33(H)  Creatinine 0.44 - 1.00 mg/dL 1.87(H) 1.84(H) 1.98(H)  Sodium 135 - 145 mmol/L 145 138 139  Potassium 3.5 - 5.1 mmol/L 4.1 4.8 5.1  Chloride 101 - 111 mmol/L 107 110 110  CO2 22 - 32 mmol/L 27 24 23   Calcium 8.9 - 10.3 mg/dL 10.2 9.7 10.0  Total Protein 6.4 - 8.3 g/dL - - -  Total Bilirubin 0.20 - 1.20 mg/dL - - -  Alkaline Phos 40 - 150 U/L - - -  AST 5 - 34 U/L - - -  ALT 0 - 55 U/L - - -   .  Lab Results  Component Value Date   FERRITIN 172 09/08/2015    RADIOGRAPHIC STUDIES: I have personally reviewed the radiological images as listed and agreed with the findings in the report. Dg Chest 2 View  08/16/2015  CLINICAL DATA:  Weakness, shortness of breath for 5 days EXAM: CHEST  2 VIEW COMPARISON:  12/17/2014 FINDINGS: Cardiomediastinal silhouette is stable. Again noted dilated aortic arch and descending aorta. Status post median sternotomy. Stable right perihilar scarring. Central mild vascular congestion without convincing pulmonary edema. Mild left basilar atelectasis. Mild degenerative changes thoracic spine. No definite superimposed infiltrate or pulmonary edema. IMPRESSION: Cardiomegaly again noted. 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 Status post median sternotomy. Stable scarring right midlung. Central mild vascular congestion without pulmonary edema. Mild left basilar atelectasis. No definite superimposed infiltrate. Electronically Signed   By: Lahoma Crocker M.D.   On: 08/16/2015 11:38    ASSESSMENT & PLAN:   70 year old female with multiple medical comorbidities including hypertension, COPD, CHF, gout, rheumatoid arthritis, moderate mitral regurgitation with  #1 Normocytic Normochromic anemia.-Her anemia is likely multifactorial from her chronic kidney disease plus rheumatoid arthritis. Her allopurinol and plaquenil could certainly be an additional factors especially if there is worsening renal function. 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 SPEP with no monoclonal protein. No clinical evidence of splenomegaly to suggest overt hypersplenism.  Recently developed Acute on Chronic Anemia due to GI bleeding requiring 5 units of PRBCs and IV feraheme in the hospital. #2 Mild thrombocytopenia #2 rheumatoid arthritis #3 chronic kidney disease #4 pulmonary hypertension #5 CHF  Plan -hgb today is stable at 10.2 and her ferritin is >100 -continue monthly ferritin checks and given IV feraheme to maintain ferritin >100 -continue on low-dose Aranesp 40 g every 4 weeks  to treat anemia of chronic disease due to CKD and rheumatoid arthritis as well as likely element of MDS. will increase dose as needed to target hemoglobin around 10-11 and hold for hemoglobin more than 11.  -Continue optimal treatment of rheumatoid arthritis. Inflammatory markers and rheumatoid factor not impressively elevated. -Continue follow-up with primary care physician for other medical cares.  Return to care with Dr. Irene Limbo in 1 month with CBC, CMP and ferritin, iron profile All of the patients questions were answered to her apparent satisfaction. The patient knows to call the  clinic with any problems, questions or concerns.  I spent 20 minutes counseling the patient face to face. The total time spent in the appointment was 25 minutes and more than 50% was on counseling and direct patient cares and reviewed her extensive inpatient records and results    Sullivan Lone MD [place] AAHIVMS [facility] Hematology/Oncology Physician [place]  (Office):  [phone] (Work cell):  [phone] (Fax):           [phone]

## 2015-09-12 NOTE — Patient Instructions (Signed)
INCREASE Lasix to 80 mg twice daily.  START Potassium 20 meq (1 tab) once daily.  Routine lab work today. Will notify you of abnormal results, otherwise no news is good news!  Follow up 2 weeks.  Do the following things EVERYDAY: 1) Weigh yourself in the morning before breakfast. Write it down and keep it in a log. 2) Take your medicines as prescribed 3) Eat low salt foods-Limit salt (sodium) to 2000 mg per day.  4) Stay as active as you can everyday 5) Limit all fluids for the day to less than 2 liters

## 2015-09-14 ENCOUNTER — Ambulatory Visit: Payer: Medicare Other

## 2015-09-14 ENCOUNTER — Other Ambulatory Visit: Payer: Self-pay | Admitting: *Deleted

## 2015-09-14 MED ORDER — DARBEPOETIN ALFA 100 MCG/0.5ML IJ SOSY: 40.0000 ug | PREFILLED_SYRINGE | Freq: Once | INTRAMUSCULAR | Status: DC

## 2015-09-14 NOTE — Progress Notes (Unsigned)
Patient to receive Aranesp only today, per Dr. Irene Limbo.

## 2015-09-15 ENCOUNTER — Other Ambulatory Visit: Payer: Self-pay | Admitting: *Deleted

## 2015-09-16 ENCOUNTER — Encounter: Payer: Self-pay | Admitting: Emergency Medicine

## 2015-09-16 ENCOUNTER — Ambulatory Visit (INDEPENDENT_AMBULATORY_CARE_PROVIDER_SITE_OTHER): Payer: Medicare Other | Admitting: Emergency Medicine

## 2015-09-16 VITALS — BP 102/62 | HR 60 | Wt 211.0 lb

## 2015-09-16 DIAGNOSIS — G4733 Obstructive sleep apnea (adult) (pediatric): Secondary | ICD-10-CM

## 2015-09-16 DIAGNOSIS — Z9989 Dependence on other enabling machines and devices: Secondary | ICD-10-CM

## 2015-09-16 DIAGNOSIS — Z23 Encounter for immunization: Secondary | ICD-10-CM | POA: Diagnosis not present

## 2015-09-16 DIAGNOSIS — J449 Chronic obstructive pulmonary disease, unspecified: Secondary | ICD-10-CM | POA: Diagnosis not present

## 2015-09-16 NOTE — Assessment & Plan Note (Signed)
Based on spirometry's that were done in 2014 and also 01/12/15 he does have combined restriction and obstruction with severe obstructive pattern on her flow volume loop and a severely decreased FEV1. Interestingly she has never felt that bronchodilators helped her very much. She was started on these by me and also in [place]. I do believe that even in absence of a bronchodilator improvement in her obstruction and emphysematous change is an important component of her hypoxemia and her dyspnea. I am never been convinced that she had significant pulmonary arterial hypertension and her evaluation more recently has suggested that she has more pulmonary venous hypertension. I think would be reasonable to try her again on scheduled bronchodilator to assess its effects.. We need to keep her adequately oxygenated.

## 2015-09-16 NOTE — Progress Notes (Signed)
Subjective:    Patient ID: April Mueller, female    DOB: 1945/12/04, 70 y.o.   MRN: [id number]  HPI Initial Visit:  70 yo, former smoker (28 pk-yrs), hx of HTN, OSA on CPAP, Rheumatoid arthritis on plaquenil. Hx AAA 2005 c/b ARF and 3 months of HD (now off). 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 She moved back to [place] from [place] in 8/13, referred by Dr Melinda Crutch for [facility] that has been managed on bosentan 112m bid + sildenifil 237mtid, supplemental O2 4L/min rest, up to 5L/min exertion.   Follow up:  ROV 11/12/14 -- follow-up visit for severe secondary primary hypertension due to COPD, rheumatoid arthritis, and obstructive sleep apnea, most recently on macetentan now for 6 months. She underwent right heart catheterization in January '16.  Showed moderate secondary PAH, with normal PVR, . Suspect she does . She is on 6L/min at rest, adjusts for exercise at pulm rehab. She is scared to stop the macetentan. Her lasix was increased slightly.   ROV 09/16/15 -- follow-up visit for secondary pulmonary hypertension in the setting of diastolic CHF, possible mitral valve disease and mitral regurgitation, rheumatoid arthritis, and COPD. She has been evaluated by Dr McAundra Dubint cardiology - see his very detailed and thorough note.  She has been under evaluation for possible valve procedure, not scheduled.  Also considering watchman placement. She has been off macetentan and does not miss it. I have reevaluated her pulmonary function testing from 01/12/15 and also from 07/03/13. She has mixed obstruction and restriction on spirometry with an FEV1 of less than a liter. She has been on BD's but has never felt that any of these have helped her.  She is compliant with her CPAP - wears it every night.   I discussed with her that general anesthesia is high risk but that I do not think it absolutely contraindicated.    Review of Systems  Constitutional: Negative for fever and unexpected weight change.  HENT:  Negative for congestion, dental problem, ear pain, nosebleeds, postnasal drip, rhinorrhea, sinus pressure, sneezing, sore throat and trouble swallowing.   Eyes: Negative for redness and itching.  Respiratory: Positive for shortness of breath. Negative for cough, chest tightness and wheezing.   Cardiovascular: Positive for leg swelling. Negative for palpitations.  Gastrointestinal: Negative for nausea, vomiting and diarrhea.  Genitourinary: Negative for dysuria.  Musculoskeletal: Negative for joint swelling.  Skin: Negative for rash.  Neurological: Negative for headaches.  Hematological: Bruises/bleeds easily.  Psychiatric/Behavioral: Negative for dysphoric mood. The patient is not nervous/anxious.     Hepatic Function Latest Ref Rng 09/08/2015 06/21/2015 03/29/2015  Total Protein 6.0 - 8.5 g/dL 6.6 7.2 7.4  Albumin 3.5 - 5.0 g/dL - 3.4(L) 3.7  AST 5 - 34 U/L - 18 16  ALT 0 - 55 U/L - 12 14  Alk Phosphatase 40 - 150 U/L - 71 64  Total Bilirubin 0.20 - 1.20 mg/dL - <0.30 0.30  Bilirubin, Direct 0.0 - 0.3 mg/dL - - -    08/18/14 --  Findings:  RA = 14 RV = 62/3/15 PA = 63/21 (38) PCW = mean 23 v waves to 40 Fick cardiac output/index = 6.3/3.2 Thermodilution CO/CI = 6.1/3.1 PVR = 2.0 WU FA sat = 91% PA sat = 57%, 62%  Assessment: 1. Mild to moderate pulmonary HTN with normal PVR which suggests pulmonary venous HTN 2. Moderately elevated left-sided pressures in the setting of her not taking lasix this am 3. Normal  cardiac output      Objective:   Physical Exam Filed Vitals:   09/16/15 1557  BP: 102/62  Pulse: 60  Weight: 211 lb (95.709 kg)  SpO2: 90%   Gen: Pleasant, well-nourished, in no distress,  normal affect  ENT: No lesions,  mouth clear,  oropharynx clear, no postnasal drip  Neck: No JVD, no TMG, no carotid bruits  Lungs: No use of accessory muscles, distant, clear without rales or rhonchi  Cardiovascular: RRR, heart sounds normal, no murmur or gallops, no  peripheral edema  Musculoskeletal: No deformities, no cyanosis or clubbing, trace to 1+ ankle edema  Neuro: alert, non focal  Skin: Warm, no lesions or rashes   Study Conclusions 12/17/12 --  - Left ventricle: The cavity size was normal. Wall thickness was normal. Systolic function was normal. The estimated ejection fraction was in the range of 55% to 60%. Wall motion was normal; there were no regional wall motion abnormalities. - Mitral valve: MR is eccentric and directed posteriorl somewhat hard to judge extent Mildly calcified annulus. Mildly thickened leaflets . Mild to moderate regurgitation. - Left atrium: The atrium was mildly dilated. - Right ventricle: The cavity size was mildly dilated. - Right atrium: The atrium was mildly dilated. - Atrial septum: The septum bowed from left to right, consistent with increased left atrial pressure. - Pulmonary arteries: PA peak pressure: 47m Hg (S).      Assessment & Plan:  COPD GOLD III but minimal obst Based on spirometry's that were done in 2014 and also 01/12/15 he does have combined restriction and obstruction with severe obstructive pattern on her flow volume loop and a severely decreased FEV1. Interestingly she has never felt that bronchodilators helped her very much. She was started on these by me and [place] I do believe that even in absence of a bronchodilator improvement in her obstruction and emphysematous change is an important component of her hypoxemia and her dyspnea. I am never been convinced that she had significant pulmonary arterial hypertension and her evaluation more recently has suggested that she has more pulmonary venous hypertension. I think would be reasonable to try her again on scheduled bronchodilator to assess its effects.. We need to keep her adequately oxygenated.  OSA on CPAP Continue CPAP daily at bedtime

## 2015-09-16 NOTE — Assessment & Plan Note (Signed)
Continue CPAP daily at bedtime 

## 2015-09-16 NOTE — Patient Instructions (Signed)
We will try starting Anoro once a day to see if you get any benefit. Take it every day until it runs out, then call us to let us know if it has helped.  You are at high risk for any surgical procedure and general anesthesia. This does not mean that he cannot have a procedure if the benefits outweigh these risks. I believe you could undergo Watchman placement. Please discuss also with Dr Aundra Dubin Continue your oxygen at all times  Follow with Dr Lamonte Sakai in 2 months or sooner if you have any problems.

## 2015-09-21 ENCOUNTER — Other Ambulatory Visit: Payer: Self-pay | Admitting: *Deleted

## 2015-09-21 ENCOUNTER — Other Ambulatory Visit: Payer: Medicare Other

## 2015-09-21 ENCOUNTER — Ambulatory Visit: Payer: Medicare Other | Admitting: Hematology

## 2015-09-21 ENCOUNTER — Telehealth: Payer: Self-pay | Admitting: Hematology

## 2015-09-21 NOTE — Progress Notes (Signed)
Per previous POF, next office visit due in 1 month from 2/2.  Pt scheduled erroneously today with Dr. Irene Limbo, called pt to inform her she does not need to follow up for 2 more weeks.  POF sent to scheduling.  Pt appreciative of call.

## 2015-09-21 NOTE — Telephone Encounter (Signed)
Spoke with patient to inform her of canceled appt for 2/15 and next appt is 3/2 per 2/15 pof

## 2015-09-26 ENCOUNTER — Ambulatory Visit (HOSPITAL_COMMUNITY)
Admission: RE | Admit: 2015-09-26 | Discharge: 2015-09-26 | Disposition: A | Discharge status: Home / Self Care | Payer: Medicare Other | Source: Ambulatory Visit | Attending: Cardiology | Admitting: Cardiology

## 2015-09-26 ENCOUNTER — Encounter (HOSPITAL_COMMUNITY): Payer: Self-pay

## 2015-09-26 VITALS — BP 104/68 | HR 60 | Resp 22 | Wt 219.0 lb

## 2015-09-26 DIAGNOSIS — I272 Other secondary pulmonary hypertension: Secondary | ICD-10-CM | POA: Diagnosis not present

## 2015-09-26 DIAGNOSIS — N183 Chronic kidney disease, stage 3 unspecified: Secondary | ICD-10-CM

## 2015-09-26 DIAGNOSIS — Z87891 Personal history of nicotine dependence: Secondary | ICD-10-CM | POA: Insufficient documentation

## 2015-09-26 DIAGNOSIS — Z841 Family history of disorders of kidney and ureter: Secondary | ICD-10-CM | POA: Insufficient documentation

## 2015-09-26 DIAGNOSIS — J9610 Chronic respiratory failure, unspecified whether with hypoxia or hypercapnia: Secondary | ICD-10-CM | POA: Diagnosis not present

## 2015-09-26 DIAGNOSIS — Z9989 Dependence on other enabling machines and devices: Secondary | ICD-10-CM

## 2015-09-26 DIAGNOSIS — I7101 Dissection of thoracic aorta: Secondary | ICD-10-CM | POA: Insufficient documentation

## 2015-09-26 DIAGNOSIS — G4733 Obstructive sleep apnea (adult) (pediatric): Secondary | ICD-10-CM

## 2015-09-26 DIAGNOSIS — IMO0002 Reserved for concepts with insufficient information to code with codable children: Secondary | ICD-10-CM

## 2015-09-26 DIAGNOSIS — Z9981 Dependence on supplemental oxygen: Secondary | ICD-10-CM | POA: Insufficient documentation

## 2015-09-26 DIAGNOSIS — E669 Obesity, unspecified: Secondary | ICD-10-CM | POA: Diagnosis not present

## 2015-09-26 DIAGNOSIS — I481 Persistent atrial fibrillation: Secondary | ICD-10-CM | POA: Diagnosis not present

## 2015-09-26 DIAGNOSIS — Z79899 Other long term (current) drug therapy: Secondary | ICD-10-CM | POA: Insufficient documentation

## 2015-09-26 DIAGNOSIS — I5032 Chronic diastolic (congestive) heart failure: Secondary | ICD-10-CM | POA: Insufficient documentation

## 2015-09-26 DIAGNOSIS — J449 Chronic obstructive pulmonary disease, unspecified: Secondary | ICD-10-CM | POA: Diagnosis not present

## 2015-09-26 DIAGNOSIS — Z833 Family history of diabetes mellitus: Secondary | ICD-10-CM | POA: Insufficient documentation

## 2015-09-26 DIAGNOSIS — Z8249 Family history of ischemic heart disease and other diseases of the circulatory system: Secondary | ICD-10-CM | POA: Insufficient documentation

## 2015-09-26 DIAGNOSIS — I340 Nonrheumatic mitral (valve) insufficiency: Secondary | ICD-10-CM

## 2015-09-26 DIAGNOSIS — I480 Paroxysmal atrial fibrillation: Secondary | ICD-10-CM | POA: Diagnosis not present

## 2015-09-26 DIAGNOSIS — D649 Anemia, unspecified: Secondary | ICD-10-CM | POA: Diagnosis not present

## 2015-09-26 DIAGNOSIS — N184 Chronic kidney disease, stage 4 (severe): Secondary | ICD-10-CM | POA: Insufficient documentation

## 2015-09-26 DIAGNOSIS — M069 Rheumatoid arthritis, unspecified: Secondary | ICD-10-CM | POA: Diagnosis not present

## 2015-09-26 MED ORDER — TORSEMIDE 20 MG PO TABS: 80.0000 mg | ORAL_TABLET | Freq: Every day | ORAL | Status: DC

## 2015-09-26 NOTE — Progress Notes (Signed)
Patient ID: April Mueller, female   DOB: [birth year], 70 y.o.   MRN: [id number]   HPI  PCP: Dr Harrington Challenger Pulmonary: Dr Byrum/Dr Melvyn Novas   April Mueller is a 70 yo with following PMHx  1) COPD       --former smoker (28 pk-yrs)       --on home O2 - 4L/min rest, up to 5L/min exertion.        --spirometry 6/13 in [place] = 0.85L FVC = 1.3L no DLCO measured. FEV1 in 2014 = 0.73        --CT chest 10/14 - moderate centrilobular emphysema. No pulm fibrosis but there was evidence for post-infectious/inflammatory scarring. Stable Asc Ao repair       --PFTs (11/14) FVC 46%, FEV1 0.8L (40%), ratio 87%, FEF 25-75% 0.44L DLCO 22%, TLC 50% => PFTs in 06/2013 did NOT suggest significant airflow obstruction according to Dr Gustavus Bryant last note despite "moderate emphysema" on CT.        --PFTs (6/16) were abnormal with severe obstruction, severe restriction, severely decreased DLCO.  Reviewed with Dr Melvyn Novas => he thinks obstruction is actually fairly minimal and that the restriction is due to body habitus.   2) Obesity 3) RA       --on plaquenil, sees Dr Lenna Gilford.  4) Type A aortic dissection s/p repair 2005     --c/b renal failure requiring HD x 3 months 5) PAH - previously treated in [place].  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      --on macitentan (revatio stopped 10/14).     --V/Q scan 11/14 no chronic PE     -- 2015 Tracleer stopped and started on macitentan. 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  6) OSA 7) Diastolic CHF    --ECHO [id number] EF 55-60% RV mildly dilated with normal function. Mild to moderate posterior MR. RVSP 56. Probable restrictive diastolic filling pattern    --ECHO 9/15 EF 60-65% RV mildly dilated with normal function. Mild to moderate posterior MR. RVSP 40. Ao Root ok    --TEE 6/16 with EF 55-60%, D-shaped interventricular septum suggestive of RV pressure/volume overload, RV mildly dilated with normal systolic function, peak RV-RA gradient 57 mmHg, very eccentric  posteriorly-directed mitral regurgitation, possibly severe, etiology may be a very small area of prolapse on the anterior leaflet, s/p ascending aorta repair with residual dissection flap in the descending thoracic aorta.       --Echo (1/17) with EF 55-60%, mild to moderate MR, PASP 66 8) CKD 9) RHC 08/18/2014: No role for pulmonary vasodilators.  RA = 14 RV = 62/3/15 PA = 63/21 (38) PCW = mean 23 v waves to 40 Fick cardiac output/index = 6.3/3.2 Thermodilution CO/CI = 6.1/3.1, PVR = 2.0 WU, FA sat = 91% PA sat = 57%, 62% 10) Mitral regurgitation: Possibly severe by 6/16 TEE, very eccentric.  Not good candidate for percutaneous MV clip (seen at [facility]).  Echo (1/17) with only mild to moderate MR.  11) Anemia: Suspect from chronic disease/renal disease but has been found to be FOBT+.  EGD/colonoscopy/capsule endoscopy in 1/17 did not show any definite site for blood loss. 12) Atrial fibrillation: Persistent, 1st noted in 1/17.   Moved back from [place] and saw Dr. Lamonte Sakai. In [place] was followed by cardiology and pulmonary. Started on Tracleer in 2006. Revatio was added, however was discontinued without any change in her symptoms. Stopped smoking in January 2005 when she had aortic aneurysm repair. Smoked 1.5-2 ppd x 15 -  20 years.  Now on macitentan.  I had her do a TEE in 6/16.  She had a loud MR murmur.  TEE showed very eccentric, posteriorly-directed MR that may be from subtle prolapse of the anterior mitral leaflet.  I am concerned that the MR could be severe but very difficult to fully visualize.  She additionally had PFTs done that were abnormal, but according to her pulmonologist Melvyn Novas), the obstruction was really only minimal and the restriction was primarily related to body habitus.  He does not think that we can explain her hypoxemia and dyspnea primarily from parenchymal lung pathology.  I sent her to [facility] for evaluation for percutaneous mitral valve clip.  They did not think that she was a good candidate.    She has been anemic and received 1 unit PRBCs in 8/16.  She saw GI and was noted to be heme negative, no scopes were done (heme negative and high risk). She has had iron infusions.   Opsumit was stopped in 9/16 due to suspicion for group 2 pulmonary hypertension only (low PVR) and she felt better off it.   She was admitted in 1/17 with increased dyspnea and was found to be profoundly anemic.  She had a total of 5 units PRBCs and an iron infusion in the hospital.  Stool was FOBT+, melena.  EGD, colonoscopy, and capsule endoscopy were all done without clear source of blood loss.  While in the hospital, she was noted to go into atrial fibrillation with RVR.  She initially required amiodarone for rate control, but was transitioned over to Toprol XL. She was not anticoagulated due to concern for GI blood loss and anemia.    She returns for followup today.  At last appointment, she remained in atrial fibrillation. I increased Lasix to 80 mg bid.  Weight is up 2 lbs. She remains short of breath after walking about 100 feet, stable.  Overall, feels ok. No BRBPR or melena currently.  No chest pain.  Stable orthopnea.  Getting monthly IV iron via hematology.  Using CPAP at night and oxygen during the day.   Labs 02/05/13 K 3.8 Creatinine 1.37 05/08/13 K 3.8, Creatinine 1.58 11/14 K 4.1, creatinine 1.82 08/09/2014: K 4.0 Creatinine 1.45  5/16: K 3.7, creatinine 1.76, BNP 447 6/16: K 5 => 4.6, creatinine 2.09 => 2.13, BNP 291 8/16: K 4.7, creatinine 2.1, HCT 30.7 9/16: K 4.5, creatinine 2.3, HCT 34.1 1/17: K 4.8, creatinine 1.84 => 1.87, HCT 29.7, plts 136, FOBT+, BNP 403 2/17: K 3.5, creatinine 1.89, BNP 329, hgb 10  ROS: All systems reviewed and negative except as per HPI.  Current Outpatient Prescriptions on File Prior to Encounter  Medication Sig Dispense Refill  . acetaminophen (TYLENOL) 325 MG tablet Take 2 tablets (650 mg total) by mouth every 6 (six) hours as needed for mild pain (or Fever >/=  101).    [name] allopurinol (ZYLOPRIM) 300 MG tablet Take 300 mg by mouth daily.    [name] ALPRAZolam (XANAX) 0.25 MG tablet Take 0.25 mg by mouth at bedtime.     [name] atorvastatin (LIPITOR) 10 MG tablet Take 1 tablet (10 mg total) by mouth daily. 90 tablet 3  . b complex vitamins tablet Take 1 tablet by mouth daily.    [name] FLUOCINOLONE ACETONIDE BODY 0.01 % OIL Apply 1 mL topically 2 (two) times daily as needed (for flares).   3  . hydroxychloroquine (PLAQUENIL) 200 MG tablet Take 200 mg by mouth 2 (two) times daily.    [name]  ketoconazole (NIZORAL) 2 % shampoo Apply 1 application topically every 14 (fourteen) days. Leave on 5 minutes before rinsing out  3  . lidocaine (LIDODERM) 5 % Place 1 patch onto the skin daily as needed (for pain).     . metoprolol succinate (TOPROL-XL) 25 MG 24 hr tablet Take 1 tablet (25 mg total) by mouth 2 (two) times daily. 180 tablet 1  . Omega-3 Fatty Acids (FISH OIL) 1200 MG CAPS Take 1,200 mg by mouth daily.    . OXYGEN Inhale 4-5 L into the lungs continuous.     . potassium chloride SA (KLOR-CON M20) 20 MEQ tablet Take 1 tablet (20 mEq total) by mouth daily. 30 tablet 6   No current facility-administered medications on file prior to encounter.   Social History   Social History  . Marital Status: Legally Separated    Spouse Name: N/A  . Number of Children: 1  . Years of Education: N/A   Occupational History  . Disability     Office Work   Social History Main Topics  . Smoking status: Former Smoker -- 1.50 packs/day for 14 years    Types: Cigarettes    Quit date: 08/09/2003  . Smokeless tobacco: Never Used  . Alcohol Use: No  . Drug Use: No  . Sexual Activity: Not Currently   Other Topics Concern  . None   Social History Narrative   Family History  Problem Relation Age of Onset  . Heart attack Mother   . Prostate cancer Father   . Diabetes Brother   . Kidney failure Brother     Filed Vitals:   09/26/15 1016  BP: 104/68  Pulse: 60  Resp: 22  Weight:  219 lb (99.338 kg)  SpO2: 95%   Physical Exam Gen: Pleasant, well-nourished, in no distress, normal affect; on O2 by [place].  HEENT: normal Neck: JVP 12-14 cm. Lungs: Crackles at bases bilaterally.  Cardiovascular: well healed sternotomy scar  PMI normal. Irregular S1S2, no S3/S4, 3/6 HSM LLSB/apex.  Extremities: No deformities, no cyanosis or clubbing, 1+ edema 1/2 to knees bilaterally.  Neuro: alert, non focal. Cranial nerves intact. Moves all 4 extremities without difficulty Skin: Warm, no lesions or rashes  Assessment/Plan: 1. 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  This appeared to be primarily pulmonary venous hypertension.  Interestingly, there were very prominent V waves in the PCWP tracing. On exam, she has a prominent MR murmur.  I was concerned that significant MR may be playing a major role in her symptomatology. I did a TEE in 6/16 showing very eccentric, posteriorly-directed mitral regurgitation possibly from subtle anterior leaflet prolapse.  I think that the MR could be severe but is difficult to fully visualize.  She has significant COPD to help explain her hypoxemia and dyspnea. She was seen at [facility] for consideration of percutaneous MV clip and was not thought to be a good candidate => concern that MV disease may not actually be severe and concern that her dyspnea is coming from COPD and would not be improved by MV clipping.  Most recent echo in 1/17 showed normal LV EF and only visualized mild to moderate MR.  She has been volume overloaded recently in the setting of the onset of persistent atrial fibrillation.  Lasix was increased at last appointment.  She remains volume overloaded today.  NYHA class III symptoms, stable. - Stop Lasix, start torsemide  80 mg daily with BMET in 1 week.   2. Chronic respiratory failure:  She is on 4-5 L home oxygen by .  She has COPD and restriction from her body habitus.  3. OSA: Continue CPAP nightly.  4. CKD: Stage IV.  She has been referred to nephrology.  BMET in 1 week after change to torsemide.  5. COPD: See #2 above, per pulmonary.     6. Aortic dissection s/p repair 2005: Although complete records are not available, it appears that the patient developed a Type A dissection in 2005 with ascending aorta repair (can see the surgically repaired ascending aorta well on TEE).  She has residual dissection in the descending thoracic aorta.  7. Pulmonary hypertension: I am concerned that this is primarily WHO group 2 (pulmonary venous hypertension) and group 3 (PH due to intrinsic lung disease) rather than primarily WHO group 1 PAH. RHC in 1/16 showed low PVR.  She is now off macitentan without feeling any worse after stopping it.  I will likely arrange eventual repeat RHC catheterization (would like to see her adequately diuresed first).  8. Mitral regurgitation:  TEE concerning for possibly severe, eccentric mitral regurgitation, most likely due to subtle prolapse of the anterior mitral valve leaflet.  I have been concerned that the MR is playing a role in her CHF. She had prominent V-waves on RHC in 1/16, and she has a loud mitral area murmur.  I do not think that she is a good candidate for open MV surgery. I had her evaluated at [facility] for possible percutaneous mitral clipping.  She was turned down for this as her MR was not clearly severe and fixing it may not help her hypoxemia appreciably.  Of note, most recent echo in 1/17 actually visualized only mild to moderate MR.  See below, will likely have repeat TEE for Watchman evaluation and can reassess mitral valve.  9. Anemia: Suspect anemia of chronic disease/renal disease but also with component of GI blood loss. Recent admission requiring 5 units PRBCs.  She was FOBT+, but unable to visualized bleeding source on EGD, colonoscopy, or capsule endoscopy.  Seeing hematology,  IV iron monthly.  10. Atrial fibrillation: Persistent.  Rate is controlled.  I suspect that this has led to worsening of her CHF.  She is not anticoagulated given recent admission for severe anemia and need for 5 units PRBCs.  Site of blood loss could not be visualized on GI evaluation.  CHADSVASC =3.   - I am going to refer her for Watchman evaluation with Dr Rayann Heman.  She was seen recently by pulmonary => general anesthesia for Watchman would be high risk but not contraindicated.  - If she gets Watchman, she will go on Coumadin for about 45 days.  I would arrange for DCCV attempt while on coumadin. Would avoid amiodarone with lung disease.    Followup in 3 wks.   April Mueller 09/26/2015

## 2015-09-26 NOTE — Patient Instructions (Signed)
Stop Furosemide (lasix)  Start Torsemide 80 mg (4 tabs) daily  Labs in 1 week  You have been referred to Dr Rayann Heman for [facility] Procedure  Your physician recommends that you schedule a follow-up appointment in: 3 weeks

## 2015-09-27 ENCOUNTER — Ambulatory Visit: Payer: Medicare Other | Admitting: Emergency Medicine

## 2015-10-03 ENCOUNTER — Ambulatory Visit (HOSPITAL_COMMUNITY)
Admission: RE | Admit: 2015-10-03 | Discharge: 2015-10-03 | Disposition: A | Discharge status: Home / Self Care | Payer: Medicare Other | Source: Ambulatory Visit | Attending: Cardiology | Admitting: Cardiology

## 2015-10-03 DIAGNOSIS — I5032 Chronic diastolic (congestive) heart failure: Secondary | ICD-10-CM | POA: Diagnosis present

## 2015-10-03 LAB — BASIC METABOLIC PANEL
Anion gap: 9 (ref 5–15)
BUN: 26 mg/dL — AB (ref 6–20)
CO2: 29 mmol/L (ref 22–32)
Calcium: 10.1 mg/dL (ref 8.9–10.3)
Chloride: 107 mmol/L (ref 101–111)
Creatinine, Ser: 1.82 mg/dL — ABNORMAL HIGH (ref 0.44–1.00)
GFR, EST AFRICAN AMERICAN: 32 mL/min — AB (ref >60)
GFR, EST NON AFRICAN AMERICAN: 28 mL/min — AB (ref >60)
Glucose, Bld: 137 mg/dL — ABNORMAL HIGH (ref 65–99)
POTASSIUM: 3.8 mmol/L (ref 3.5–5.1)
SODIUM: 145 mmol/L (ref 135–145)

## 2015-10-03 LAB — URIC ACID: URIC ACID, SERUM: 4.6 mg/dL (ref 2.3–6.6)

## 2015-10-06 ENCOUNTER — Other Ambulatory Visit (HOSPITAL_BASED_OUTPATIENT_CLINIC_OR_DEPARTMENT_OTHER): Payer: Medicare Other

## 2015-10-06 ENCOUNTER — Encounter: Payer: Self-pay | Admitting: Hematology

## 2015-10-06 ENCOUNTER — Other Ambulatory Visit: Payer: Medicare Other

## 2015-10-06 ENCOUNTER — Ambulatory Visit: Payer: Medicare Other

## 2015-10-06 ENCOUNTER — Telehealth: Payer: Self-pay | Admitting: Hematology

## 2015-10-06 ENCOUNTER — Ambulatory Visit (HOSPITAL_BASED_OUTPATIENT_CLINIC_OR_DEPARTMENT_OTHER): Payer: Medicare Other

## 2015-10-06 ENCOUNTER — Ambulatory Visit (HOSPITAL_BASED_OUTPATIENT_CLINIC_OR_DEPARTMENT_OTHER): Payer: Medicare Other | Admitting: Hematology

## 2015-10-06 ENCOUNTER — Telehealth: Payer: Self-pay | Admitting: Emergency Medicine

## 2015-10-06 VITALS — BP 149/62 | HR 64 | Temp 98.1°F | Resp 20 | Wt 216.4 lb

## 2015-10-06 DIAGNOSIS — M069 Rheumatoid arthritis, unspecified: Secondary | ICD-10-CM

## 2015-10-06 DIAGNOSIS — I509 Heart failure, unspecified: Secondary | ICD-10-CM

## 2015-10-06 DIAGNOSIS — D649 Anemia, unspecified: Secondary | ICD-10-CM

## 2015-10-06 DIAGNOSIS — D469 Myelodysplastic syndrome, unspecified: Secondary | ICD-10-CM

## 2015-10-06 DIAGNOSIS — N189 Chronic kidney disease, unspecified: Secondary | ICD-10-CM

## 2015-10-06 DIAGNOSIS — D696 Thrombocytopenia, unspecified: Secondary | ICD-10-CM | POA: Diagnosis not present

## 2015-10-06 DIAGNOSIS — I272 Other secondary pulmonary hypertension: Secondary | ICD-10-CM

## 2015-10-06 DIAGNOSIS — D638 Anemia in other chronic diseases classified elsewhere: Secondary | ICD-10-CM

## 2015-10-06 DIAGNOSIS — D500 Iron deficiency anemia secondary to blood loss (chronic): Secondary | ICD-10-CM

## 2015-10-06 LAB — COMPREHENSIVE METABOLIC PANEL
ALBUMIN: 3.2 g/dL — AB (ref 3.5–5.0)
ALK PHOS: 70 U/L (ref 40–150)
ALT: 10 U/L (ref 0–55)
AST: 18 U/L (ref 5–34)
Anion Gap: 10 mEq/L (ref 3–11)
BILIRUBIN TOTAL: 0.35 mg/dL (ref 0.20–1.20)
BUN: 46.1 mg/dL — AB (ref 7.0–26.0)
CO2: 29 mEq/L (ref 22–29)
CREATININE: 2.5 mg/dL — AB (ref 0.6–1.1)
Calcium: 10.5 mg/dL — ABNORMAL HIGH (ref 8.4–10.4)
Chloride: 103 mEq/L (ref 98–109)
EGFR: 22 mL/min/{1.73_m2} — AB (ref >90)
GLUCOSE: 132 mg/dL (ref 70–140)
Potassium: 3.8 mEq/L (ref 3.5–5.1)
SODIUM: 141 meq/L (ref 136–145)
TOTAL PROTEIN: 7.5 g/dL (ref 6.4–8.3)

## 2015-10-06 LAB — CBC & DIFF AND RETIC
BASO%: 0.4 % (ref 0.0–2.0)
Basophils Absolute: 0 10*3/uL (ref 0.0–0.1)
EOS ABS: 0.1 10*3/uL (ref 0.0–0.5)
EOS%: 1.1 % (ref 0.0–7.0)
HCT: 35.4 % (ref 34.8–46.6)
HEMOGLOBIN: 10.7 g/dL — AB (ref 11.6–15.9)
IMMATURE RETIC FRACT: 7.6 % (ref 1.60–10.00)
LYMPH%: 11.7 % — AB (ref 14.0–49.7)
MCH: 29.6 pg (ref 25.1–34.0)
MCHC: 30.2 g/dL — ABNORMAL LOW (ref 31.5–36.0)
MCV: 98.1 fL (ref 79.5–101.0)
MONO#: 0.4 10*3/uL (ref 0.1–0.9)
MONO%: 5.8 % (ref 0.0–14.0)
NEUT%: 81 % — ABNORMAL HIGH (ref 38.4–76.8)
NEUTROS ABS: 5.7 10*3/uL (ref 1.5–6.5)
Platelets: 124 10*3/uL — ABNORMAL LOW (ref 145–400)
RBC: 3.61 10*6/uL — ABNORMAL LOW (ref 3.70–5.45)
RDW: 15.5 % — ABNORMAL HIGH (ref 11.2–14.5)
RETIC CT ABS: 50.54 10*3/uL (ref 33.70–90.70)
Retic %: 1.4 % (ref 0.70–2.10)
WBC: 7 10*3/uL (ref 3.9–10.3)
lymph#: 0.8 10*3/uL — ABNORMAL LOW (ref 0.9–3.3)

## 2015-10-06 LAB — FERRITIN: Ferritin: 102 ng/ml (ref 9–269)

## 2015-10-06 MED ORDER — DARBEPOETIN ALFA 100 MCG/0.5ML IJ SOSY
40.0000 ug | PREFILLED_SYRINGE | Freq: Once | INTRAMUSCULAR | Status: AC
  Administered 2015-10-06: 40 ug via SUBCUTANEOUS
  Filled 2015-10-06: qty 0.5

## 2015-10-06 NOTE — Progress Notes (Signed)
[name]    HEMATOLOGY/ONCOLOGY CLINIC NOTE  Date of Service:   Patient Care Team: Lona Kettle, MD as PCP - General (Family Medicine)  CHIEF COMPLAINTS/PURPOSE OF CONSULTATION: f/u for Anemia  Diagnosis: Normocytic Normochromic Anemia likely multifactorial - CKD/RA/GIB/MDS  Treatment: Aranesp + IV feraheme as needed  HISTORY OF PRESENTING ILLNESS: Please see my initial consultation for details on initial presentation.  INTERVAL HISTORY  [name] is here for her scheduled follow-up for her anemia. She knows that she feels very well. Her hemoglobin levels are up to 10.7. No evidence of overt GI bleeding. Has been off aspirin for her A. fib. Has a meeting cardiology today to discuss the possible placement of a watchman device for reduction of stroke risk related to her atrial fibrillation. No other acute new concerns. Appears to be feeling more relaxed currently.  MEDICAL HISTORY:  Past Medical History  Diagnosis Date  . Hypertension   . Pulmonary HTN ([place])   . Aortic aneurysm ([place])   . Sleep apnea     wears CPAP  . CKD (chronic kidney disease) stage 3, GFR 30-59 ml/min   . Anemia 03/23/15    transfusion  . Arthritis   . CHF (congestive heart failure) ([place])   . Gout   . Rheumatoid arthritis(714.0) 11/06/2012    SURGICAL HISTORY: Past Surgical History  Procedure Laterality Date  . Abdominal aortic aneurysm repair    . Right heart catheterization N/A 08/18/2014    Procedure: RIGHT HEART CATH;  Surgeon: Jolaine Artist, MD;  Location: [facility] CATH LAB;  Service: Cardiovascular;  Laterality: N/A;  . Tee without cardioversion N/A 01/05/2015    Procedure: TRANSESOPHAGEAL ECHOCARDIOGRAM (TEE);  Surgeon: Larey Dresser, MD;  Location: [place];  Service: Cardiovascular;  Laterality: N/A;  . Wisdom tooth extraction    . Givens capsule study N/A 08/21/2015    Procedure: GIVENS CAPSULE STUDY;  Surgeon: Gatha Mayer, MD;  Location: [place];  Service: Endoscopy;  Laterality: N/A;    . Esophagogastroduodenoscopy N/A 08/19/2015    Procedure: ESOPHAGOGASTRODUODENOSCOPY (EGD);  Surgeon: Jerene Bears, MD;  Location: [facility] ENDOSCOPY;  Service: Endoscopy;  Laterality: N/A;  patient scheduled, anesthesia aware of 1500 case, per [place].  08/18/15 DP  . Colonoscopy N/A 08/19/2015    Procedure: COLONOSCOPY;  Surgeon: Jerene Bears, MD;  Location: [facility] ENDOSCOPY;  Service: Endoscopy;  Laterality: N/A;    SOCIAL HISTORY: Social History   Social History  . Marital Status: Legally Separated    Spouse Name: N/A  . Number of Children: 1  . Years of Education: N/A   Occupational History  . Disability     Office Work   Social History Main Topics  . Smoking status: Former Smoker -- 1.50 packs/day for 14 years    Types: Cigarettes    Quit date: 08/09/2003  . Smokeless tobacco: Never Used  . Alcohol Use: No  . Drug Use: No  . Sexual Activity: Not Currently   Other Topics Concern  . Not on file   Social History Narrative    FAMILY HISTORY: Family History  Problem Relation Age of Onset  . Heart attack Mother   . Prostate cancer Father   . Diabetes Brother   . Kidney failure Brother     ALLERGIES:  has No Known Allergies.  MEDICATIONS:  Current Outpatient Prescriptions  Medication Sig Dispense Refill  . acetaminophen (TYLENOL) 325 MG tablet Take 2 tablets (650 mg total) by mouth every 6 (six) hours as needed for mild  pain (or Fever >/= 101).    [name] allopurinol (ZYLOPRIM) 300 MG tablet Take 300 mg by mouth daily.    [name] ALPRAZolam (XANAX) 0.25 MG tablet Take 0.25 mg by mouth at bedtime.     [name] atorvastatin (LIPITOR) 10 MG tablet Take 1 tablet (10 mg total) by mouth daily. 90 tablet 3  . b complex vitamins tablet Take 1 tablet by mouth daily.    [name] FLUOCINOLONE ACETONIDE BODY 0.01 % OIL Apply 1 mL topically 2 (two) times daily as needed (for flares).   3  . hydroxychloroquine (PLAQUENIL) 200 MG tablet Take 200 mg by mouth 2 (two) times daily.    [name] ketoconazole (NIZORAL) 2 %  shampoo Apply 1 application topically every 14 (fourteen) days. Leave on 5 minutes before rinsing out  3  . lidocaine (LIDODERM) 5 % Place 1 patch onto the skin daily as needed (for pain).     . metoprolol succinate (TOPROL-XL) 25 MG 24 hr tablet Take 1 tablet (25 mg total) by mouth 2 (two) times daily. 180 tablet 1  . Omega-3 Fatty Acids (FISH OIL) 1200 MG CAPS Take 1,200 mg by mouth daily.    . OXYGEN Inhale 4-5 L into the lungs continuous.     . potassium chloride SA (KLOR-CON M20) 20 MEQ tablet Take 1 tablet (20 mEq total) by mouth daily. 30 tablet 6  . torsemide (DEMADEX) 20 MG tablet Take 4 tablets (80 mg total) by mouth daily. 120 tablet 3  . umeclidinium-vilanterol (ANORO ELLIPTA) 62.5-25 MCG/INH AEPB Inhale 1 puff into the lungs daily.     No current facility-administered medications for this visit.    REVIEW OF SYSTEMS:    10 Point review of Systems was done is negative except as noted above.  PHYSICAL EXAMINATION: ECOG PERFORMANCE STATUS: 3 - Symptomatic, >50% confined to bed  . Filed Vitals:   10/06/15 0915  Weight: 216 lb 6.4 oz (98.158 kg)   Filed Weights   10/06/15 0915  Weight: 216 lb 6.4 oz (98.158 kg)   .Body mass index is 37.13 kg/(m^2).  GENERAL:alert, in no acute distress and comfortable on [place] oxygen SKIN: skin color, texture, turgor are normal, no rashes or significant lesions EYES: normal, conjunctiva are pink and non-injected, sclera clear OROPHARYNX:no exudate, no erythema and lips, buccal mucosa, and tongue normal  NECK: supple, no JVD, thyroid normal size, non-tender, without nodularity LYMPH:  no palpable lymphadenopathy in the cervical, axillary or inguinal LUNGS: clear to auscultation with normal respiratory effort HEART: regular rate & rhythm,  no murmurs and no lower extremity edema ABDOMEN: abdomen soft, non-tender, normoactive bowel sounds  Musculoskeletal: no cyanosis of digits and no clubbing  PSYCH: alert & oriented x 3 with fluent  speech NEURO: no focal motor/sensory deficits  LABORATORY DATA:  I have reviewed the data as listed  . CBC Latest Ref Rng 10/06/2015 09/08/2015 09/05/2015  WBC 3.9 - 10.3 10e3/uL 7.0 6.2 6.6  Hemoglobin 11.6 - 15.9 g/dL 10.7(L) 10.0(L) 10.1(L)  Hematocrit 34.8 - 46.6 % 35.4 33.3(L) 33.1(L)  Platelets 145 - 400 10e3/uL 124(L) 143(L) 135(L)     . CMP Latest Ref Rng 10/06/2015 10/03/2015 09/12/2015  Glucose 70 - 140 mg/dl 132 137(H) 134(H)  BUN 7.0 - 26.0 mg/dL 46.1(H) 26(H) 25(H)  Creatinine 0.6 - 1.1 mg/dL 2.5(H) 1.82(H) 1.89(H)  Sodium 136 - 145 mEq/L 141 145 145  Potassium 3.5 - 5.1 mEq/L 3.8 3.8 3.5  Chloride 101 - 111 mmol/L - 107 105  CO2 22 -  29 mEq/L 29 29 29   Calcium 8.4 - 10.4 mg/dL 10.5(H) 10.1 10.2  Total Protein 6.4 - 8.3 g/dL 7.5 - -  Total Bilirubin 0.20 - 1.20 mg/dL 0.35 - -  Alkaline Phos 40 - 150 U/L 70 - -  AST 5 - 34 U/L 18 - -  ALT 0 - 55 U/L 10 - -   .  Lab Results  Component Value Date   FERRITIN 172 09/08/2015    RADIOGRAPHIC STUDIES: I have personally reviewed the radiological images as listed and agreed with the findings in the report. No results found.  ASSESSMENT & PLAN:   70 year old female with multiple medical comorbidities including hypertension, COPD, CHF, gout, rheumatoid arthritis, moderate mitral regurgitation with  #1 Normocytic Normochromic anemia.-Her anemia is likely multifactorial from her chronic kidney disease plus rheumatoid arthritis. Her allopurinol and plaquenil could certainly be an additional factors especially if there is worsening renal function. 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 SPEP with no monoclonal protein. No clinical evidence of splenomegaly to suggest overt hypersplenism.  Recently developed Acute on Chronic Anemia due to GI bleeding requiring 5 units of PRBCs and IV feraheme in the hospital. #2 Mild thrombocytopenia #2 rheumatoid arthritis #3 chronic kidney  disease #4 pulmonary hypertension #5 CHF  Plan -hgb today is stable at 10.7  -continue on low-dose Aranesp 40 g every 4 weeks  to treat anemia of chronic disease due to CKD and rheumatoid arthritis as well as likely element of MDS. will increase dose as needed to target hemoglobin around 10-11 and hold for hemoglobin more than 11.  -Due for her next dose of Aranesp today -We'll follow-up on ferritin levels and schedule replacement with IV iron if ferritin level less than 100. -continue monthly ferritin checks and given IV feraheme to maintain ferritin >100 -Continue optimal treatment of rheumatoid arthritis. Inflammatory markers and rheumatoid factor not impressively elevated. -Continue follow-up with primary care physician for other medical cares. -She was given some reading material regarding watchman device such she shall be meeting cardiology today and can discuss the pros and cons of this for stroke prevention from her atrial fibrillation.  Return to care with Dr. Irene Limbo in 1 month with CBC, CMP and ferritin, iron profile All of the patients questions were answered to her apparent satisfaction. The patient knows to call the clinic with any problems, questions or concerns.  I spent 15 minutes counseling the patient face to face. The total time spent in the appointment was 15 minutes and more than 50% was on counseling and direct patient cares and reviewed her extensive inpatient records and results    Sullivan Lone MD [place] AAHIVMS [facility] Hematology/Oncology Physician [facility]  (Office):       [phone] (Work cell):  [phone] (Fax):           [phone]

## 2015-10-06 NOTE — Progress Notes (Signed)
Patient came in office and wanted to know if her rent had been paid. I asked patient if she had contacted her landlord to find out if they received our check. She said no. Advised patient that I submitted the request and the check number was showing but she would need to contact her landlord to be sure the check had been received and applied as well as what her balance is. Patient called while she was in my office to get this information. Patient thanked me and has my card for any additional questions or concerns.

## 2015-10-06 NOTE — Telephone Encounter (Signed)
Gave and printed appt sched and avs fo rpt for march °

## 2015-10-06 NOTE — Telephone Encounter (Signed)
Spoke with pt. When she was here last, RB started her on Anoro daily. Has 1 week left of samples. The copay on this is going to be to expensive. Would like an alternative.  RB - please advise. Thanks.

## 2015-10-07 NOTE — Telephone Encounter (Signed)
Has she benefited from the Anoro? We weren't sure she would. If so then we can consider alternatives, first choices would be Stiolto or Spiriva, suspect that these will also be expensive copays. There may not be a less expensive long-acting option.

## 2015-10-07 NOTE — Telephone Encounter (Signed)
ATC, line rings busy. WCB

## 2015-10-10 NOTE — Telephone Encounter (Signed)
Spoke with pt, states she seems to be doing some better while taking the anoro, but pt states she had a couple of other med changes during that time so she cannot fully determine if her improvement was from the anoro or another medication.    RB please advise.  Thanks!

## 2015-10-12 NOTE — Telephone Encounter (Signed)
Spoke with pt. She is aware that RB wants to stay on this medication. Samples will be left at the front desk. Nothing further was needed at this time.

## 2015-10-12 NOTE — Telephone Encounter (Signed)
If possible I would like for her to continue the Anoro qd until we can see each other back for OV

## 2015-10-13 ENCOUNTER — Ambulatory Visit
Admission: RE | Admit: 2015-10-13 | Discharge: 2015-10-13 | Disposition: A | Discharge status: Home / Self Care | Payer: Medicare Other | Source: Ambulatory Visit

## 2015-10-13 DIAGNOSIS — Z1231 Encounter for screening mammogram for malignant neoplasm of breast: Secondary | ICD-10-CM

## 2015-10-17 ENCOUNTER — Ambulatory Visit (INDEPENDENT_AMBULATORY_CARE_PROVIDER_SITE_OTHER): Payer: Medicare Other | Admitting: Internal Medicine

## 2015-10-17 ENCOUNTER — Encounter: Payer: Self-pay | Admitting: Internal Medicine

## 2015-10-17 VITALS — BP 112/76 | HR 61 | Ht 64.0 in | Wt 216.4 lb

## 2015-10-17 DIAGNOSIS — I10 Essential (primary) hypertension: Secondary | ICD-10-CM | POA: Diagnosis not present

## 2015-10-17 DIAGNOSIS — I481 Persistent atrial fibrillation: Secondary | ICD-10-CM

## 2015-10-17 DIAGNOSIS — G4733 Obstructive sleep apnea (adult) (pediatric): Secondary | ICD-10-CM | POA: Diagnosis not present

## 2015-10-17 DIAGNOSIS — Z9989 Dependence on other enabling machines and devices: Secondary | ICD-10-CM

## 2015-10-17 DIAGNOSIS — I4819 Other persistent atrial fibrillation: Secondary | ICD-10-CM

## 2015-10-17 NOTE — Patient Instructions (Signed)
Medication Instructions:  Your physician recommends that you continue on your current medications as directed. Please refer to the Current Medication list given to you today.   Labwork: None ordered.  Testing/Procedures: None ordered.  Follow-Up: Your physician recommends that you schedule a follow-up appointment as needed with Dr Allred.   Any Other Special Instructions Will Be Listed Below (If Applicable).     If you need a refill on your cardiac medications before your next appointment, please call your pharmacy.   

## 2015-10-17 NOTE — Progress Notes (Signed)
Watchman Consult Note   Date:  10/17/2015   ID:  April Mueller, DOB April 26, 1946, MRN [id number]  PCP:   Melinda Crutch, MD  Cardiologist:  Aundra Dubin Referring Physician: Aundra Dubin   CC: to discuss Watchman implant    History of Present Illness: April Mueller is a 70 y.o. female who presents today for evaluation of left atrial appendage occluder.  She has persistent atrial fibrillation as well as prior GI bleeding on ASA alone.  The patient has been evaluated by their referring physician and is felt to be a poor candidate for long term [place] due to GI bleeding.  She therefore presents today for Watchman evaluation.   Other history is significant for hypertension, pulmonary hypertension, CKD, COPD dependent on home O2 and rheumatoid arthritis. She is largely unaware of her AF.   Today, she denies symptoms of chest pain, orthopnea, PND, lower extremity edema, claudication, dizziness, presyncope, syncope, recurrent bleeding, or neurologic sequela. The patient is tolerating medications without difficulties and is otherwise without complaint today.    Past Medical History  Diagnosis Date  . Hypertension   . Pulmonary HTN ([place])   . Aortic aneurysm ([place])   . Sleep apnea     wears CPAP  . CKD (chronic kidney disease) stage 3, GFR 30-59 ml/min   . Anemia 03/23/15    transfusion  . Arthritis   . CHF (congestive heart failure) ([place])   . Gout   . Rheumatoid arthritis(714.0) 11/06/2012   Past Surgical History  Procedure Laterality Date  . Abdominal aortic aneurysm repair    . Right heart catheterization N/A 08/18/2014    Procedure: RIGHT HEART CATH;  Surgeon: Jolaine Artist, MD;  Location: [facility] CATH LAB;  Service: Cardiovascular;  Laterality: N/A;  . Tee without cardioversion N/A 01/05/2015    Procedure: TRANSESOPHAGEAL ECHOCARDIOGRAM (TEE);  Surgeon: Larey Dresser, MD;  Location: [place];  Service: Cardiovascular;  Laterality: N/A;  . Wisdom tooth extraction    . Givens capsule study N/A  08/21/2015    Procedure: GIVENS CAPSULE STUDY;  Surgeon: Gatha Mayer, MD;  Location: [place];  Service: Endoscopy;  Laterality: N/A;  . Esophagogastroduodenoscopy N/A 08/19/2015    Procedure: ESOPHAGOGASTRODUODENOSCOPY (EGD);  Surgeon: Jerene Bears, MD;  Location: [facility] ENDOSCOPY;  Service: Endoscopy;  Laterality: N/A;  patient scheduled, anesthesia aware of 1500 case, per [place].  08/18/15 DP  . Colonoscopy N/A 08/19/2015    Procedure: COLONOSCOPY;  Surgeon: Jerene Bears, MD;  Location: [facility] ENDOSCOPY;  Service: Endoscopy;  Laterality: N/A;     Current Outpatient Prescriptions  Medication Sig Dispense Refill  . acetaminophen (TYLENOL) 325 MG tablet Take 2 tablets (650 mg total) by mouth every 6 (six) hours as needed for mild pain (or Fever >/= 101).    [name] allopurinol (ZYLOPRIM) 300 MG tablet Take 300 mg by mouth daily.    [name] ALPRAZolam (XANAX) 0.25 MG tablet Take 0.25 mg by mouth at bedtime.     [name] atorvastatin (LIPITOR) 10 MG tablet Take 1 tablet (10 mg total) by mouth daily. 90 tablet 3  . b complex vitamins tablet Take 1 tablet by mouth daily.    [name] FLUOCINOLONE ACETONIDE BODY 0.01 % OIL Apply 1 mL topically 2 (two) times daily as needed (for flares).   3  . hydroxychloroquine (PLAQUENIL) 200 MG tablet Take 200 mg by mouth 2 (two) times daily.    [name] ketoconazole (NIZORAL) 2 % shampoo Apply 1 application topically every 14 (fourteen) days. Leave on  5 minutes before rinsing out  3  . lidocaine (LIDODERM) 5 % Place 1 patch onto the skin daily as needed (for pain).     . metoprolol succinate (TOPROL-XL) 25 MG 24 hr tablet Take 1 tablet (25 mg total) by mouth 2 (two) times daily. 180 tablet 1  . Omega-3 Fatty Acids (FISH OIL) 1200 MG CAPS Take 1,200 mg by mouth daily.    . OXYGEN Inhale 4-5 L into the lungs continuous.     . potassium chloride SA (KLOR-CON M20) 20 MEQ tablet Take 1 tablet (20 mEq total) by mouth daily. 30 tablet 6  . torsemide (DEMADEX) 20 MG tablet Take 4 tablets (80 mg total) by  mouth daily. 120 tablet 3  . umeclidinium-vilanterol (ANORO ELLIPTA) 62.5-25 MCG/INH AEPB Inhale 1 puff into the lungs daily.     No current facility-administered medications for this visit.    Allergies:   Review of patient's allergies indicates no known allergies.   Social History:  The patient  reports that she quit smoking about 12 years ago. Her smoking use included Cigarettes. She has a 21 pack-year smoking history. She has never used smokeless tobacco. She reports that she does not drink alcohol or use illicit drugs.   Family History:  The patient's family history includes Diabetes in her brother; Heart attack in her mother; Kidney failure in her brother; Prostate cancer in her father.    ROS:  Please see the history of present illness.   All other systems are reviewed and negative.    PHYSICAL EXAM: VS:  BP 112/76 mmHg  Pulse 61  Ht 5\' 4"  (1.626 m)  Wt 216 lb 6.4 oz (98.158 kg)  BMI 37.13 kg/m2 , BMI Body mass index is 37.13 kg/(m^2). GEN: Well nourished, well developed, in no acute distress, wearing O2 HEENT: normal Neck: no JVD, carotid bruits, or masses Cardiac: iRRR; no murmurs, rubs, or gallops,no edema  Respiratory:  clear to auscultation bilaterally, normal work of breathing GI: soft, nontender, nondistended, + BS MS: no deformity or atrophy Skin: warm and dry  Neuro:  Strength and sensation are intact Psych: euthymic mood, full affect  EKG:  EKG is ordered today. The ekg ordered today shows atrial fibrillation    Recent Labs: 12/17/2014: Pro B Natriuretic peptide (BNP) 447.0*; TSH 1.94 09/12/2015: B Natriuretic Peptide 329.0* 10/06/2015: ALT 10; BUN 46.1*; Creatinine 2.5*; HGB 10.7*; Platelets 124*; Potassium 3.8; Sodium 141    Lipid Panel     Component Value Date/Time   CHOL 112 02/16/2015 1059   TRIG 31 02/16/2015 1059   HDL 52 02/16/2015 1059   CHOLHDL 2.2 02/16/2015 1059   VLDL 6 02/16/2015 1059   LDLCALC 54 02/16/2015 1059     Wt Readings from  Last 3 Encounters:  10/17/15 216 lb 6.4 oz (98.158 kg)  10/06/15 216 lb 6.4 oz (98.158 kg)  09/26/15 219 lb (99.338 kg)      Other studies Reviewed: Additional studies/ records that were reviewed today include: Dr Aundra Dubin and Dr Agustina Caroli office notes    ASSESSMENT AND PLAN:  1.  Persistent atrial fibrillation I have seen April Mueller is a 70 y.o. female in the office today who has been referred by Dr Aundra Dubin for a Watchman left atrial appendage closure device.  She has a history of persistent atrial fibrillation.  This patients CHA2DS2-VASc Score and unadjusted Ischemic Stroke Rate (% per year) is equal to 4.8 % stroke rate/year from a score of 4 which necessitates long term  oral anticoagulation to prevent stroke. Given prior GI bleed on ASA alone, I am not sure that Watchman is a good option for the patient as she would require lifelong ASA therapy.  We have discussed NOAC therapy today and she would like to discuss a trial of Eliquis with Dr Aundra Dubin when she sees him later this week.  I think this is reasonable.  Her procedural risks are not trivial with respiratory status.  I think that we should avoid watchman and general anesthesia at this time.  2. HTN Stable No change required today  3. OSA cpap  Follow-up:  With Dr Aundra Dubin as scheduled.   Current medicines are reviewed at length with the patient today.   The patient does not have concerns regarding her medicines.  The following changes were made today:  none  Labs/ tests ordered today include: none    Signed, Thompson Grayer, MD   10/17/2015 2:04 PM    [address] [place] [id number] [phone] (office) [phone] (fax)

## 2015-10-21 ENCOUNTER — Ambulatory Visit (HOSPITAL_COMMUNITY)
Admission: RE | Admit: 2015-10-21 | Discharge: 2015-10-21 | Disposition: A | Discharge status: Home / Self Care | Payer: Medicare Other | Source: Ambulatory Visit | Attending: Cardiology | Admitting: Cardiology

## 2015-10-21 ENCOUNTER — Encounter (HOSPITAL_COMMUNITY): Payer: Self-pay

## 2015-10-21 VITALS — BP 110/72 | HR 75 | Wt 214.0 lb

## 2015-10-21 DIAGNOSIS — Z9981 Dependence on supplemental oxygen: Secondary | ICD-10-CM | POA: Insufficient documentation

## 2015-10-21 DIAGNOSIS — D649 Anemia, unspecified: Secondary | ICD-10-CM | POA: Diagnosis not present

## 2015-10-21 DIAGNOSIS — I7101 Dissection of thoracic aorta: Secondary | ICD-10-CM | POA: Diagnosis not present

## 2015-10-21 DIAGNOSIS — Z87891 Personal history of nicotine dependence: Secondary | ICD-10-CM | POA: Diagnosis not present

## 2015-10-21 DIAGNOSIS — I481 Persistent atrial fibrillation: Secondary | ICD-10-CM | POA: Insufficient documentation

## 2015-10-21 DIAGNOSIS — N183 Chronic kidney disease, stage 3 (moderate): Secondary | ICD-10-CM | POA: Insufficient documentation

## 2015-10-21 DIAGNOSIS — I5032 Chronic diastolic (congestive) heart failure: Secondary | ICD-10-CM | POA: Insufficient documentation

## 2015-10-21 DIAGNOSIS — E669 Obesity, unspecified: Secondary | ICD-10-CM | POA: Diagnosis not present

## 2015-10-21 DIAGNOSIS — Z8249 Family history of ischemic heart disease and other diseases of the circulatory system: Secondary | ICD-10-CM | POA: Diagnosis not present

## 2015-10-21 DIAGNOSIS — G4733 Obstructive sleep apnea (adult) (pediatric): Secondary | ICD-10-CM | POA: Diagnosis not present

## 2015-10-21 DIAGNOSIS — Z79899 Other long term (current) drug therapy: Secondary | ICD-10-CM | POA: Diagnosis not present

## 2015-10-21 DIAGNOSIS — I4819 Other persistent atrial fibrillation: Secondary | ICD-10-CM

## 2015-10-21 DIAGNOSIS — I340 Nonrheumatic mitral (valve) insufficiency: Secondary | ICD-10-CM | POA: Insufficient documentation

## 2015-10-21 DIAGNOSIS — J449 Chronic obstructive pulmonary disease, unspecified: Secondary | ICD-10-CM | POA: Insufficient documentation

## 2015-10-21 DIAGNOSIS — Z833 Family history of diabetes mellitus: Secondary | ICD-10-CM | POA: Insufficient documentation

## 2015-10-21 DIAGNOSIS — J9611 Chronic respiratory failure with hypoxia: Secondary | ICD-10-CM | POA: Diagnosis not present

## 2015-10-21 DIAGNOSIS — M069 Rheumatoid arthritis, unspecified: Secondary | ICD-10-CM | POA: Diagnosis not present

## 2015-10-21 DIAGNOSIS — I272 Other secondary pulmonary hypertension: Secondary | ICD-10-CM | POA: Diagnosis not present

## 2015-10-21 DIAGNOSIS — J9610 Chronic respiratory failure, unspecified whether with hypoxia or hypercapnia: Secondary | ICD-10-CM | POA: Diagnosis not present

## 2015-10-21 LAB — BASIC METABOLIC PANEL
ANION GAP: 11 (ref 5–15)
BUN: 28 mg/dL — ABNORMAL HIGH (ref 6–20)
CALCIUM: 10.2 mg/dL (ref 8.9–10.3)
CO2: 28 mmol/L (ref 22–32)
CREATININE: 2.08 mg/dL — AB (ref 0.44–1.00)
Chloride: 103 mmol/L (ref 101–111)
GFR calc Af Amer: 27 mL/min — ABNORMAL LOW (ref >60)
GFR calc non Af Amer: 24 mL/min — ABNORMAL LOW (ref >60)
GLUCOSE: 125 mg/dL — AB (ref 65–99)
Potassium: 3.6 mmol/L (ref 3.5–5.1)
Sodium: 142 mmol/L (ref 135–145)

## 2015-10-21 LAB — CBC
HCT: 36.1 % (ref 36.0–46.0)
HEMOGLOBIN: 10.7 g/dL — AB (ref 12.0–15.0)
MCH: 28.9 pg (ref 26.0–34.0)
MCHC: 29.6 g/dL — AB (ref 30.0–36.0)
MCV: 97.6 fL (ref 78.0–100.0)
Platelets: 120 10*3/uL — ABNORMAL LOW (ref 150–400)
RBC: 3.7 MIL/uL — ABNORMAL LOW (ref 3.87–5.11)
RDW: 15.4 % (ref 11.5–15.5)
WBC: 7 10*3/uL (ref 4.0–10.5)

## 2015-10-21 LAB — PROTIME-INR
INR: 1.21 (ref 0.00–1.49)
PROTHROMBIN TIME: 15.5 s — AB (ref 11.6–15.2)

## 2015-10-21 MED ORDER — APIXABAN 5 MG PO TABS: 5.0000 mg | ORAL_TABLET | Freq: Two times a day (BID) | ORAL | Status: DC

## 2015-10-21 NOTE — Progress Notes (Addendum)
Advanced Heart Failure Medication Review by a Pharmacist  Does the patient  feel that his/her medications are working for him/her?  yes  Has the patient been experiencing any side effects to the medications prescribed?  no  Does the patient measure his/her own blood pressure or blood glucose at home?  no   Does the patient have any problems obtaining medications due to transportation or finances?   no  Understanding of regimen: good Understanding of indications: good Potential of compliance: good Patient understands to avoid NSAIDs. Patient understands to avoid decongestants.  Issues to address at subsequent visits: None   Pharmacist comments: 70 YO pleasant female presenting to HF clinic for followup.  Pt denies SE to her medication regimen but states she has [employer] and has problems with her copays until she meets her deductible. Discussed the PAN foundation with pt and pt would like to enroll and would be interested in receiving Eliquis samples today until she is able to be enrolled in the pan foundation.  Will send info to clinic PharmD for enrollment.    Time with patient: 10 min  Preparation and documentation time: 5 min  Total time: 15 min

## 2015-10-21 NOTE — Patient Instructions (Addendum)
Routine lab work today. Will notify you of abnormal results, otherwise no news is good news!  Heart catheterization schedule for Monday 10/24/2015. See instruction sheet for details.  Follow up 1 month with Dr. Aundra Dubin.  Do the following things EVERYDAY: 1) Weigh yourself in the morning before breakfast. Write it down and keep it in a log. 2) Take your medicines as prescribed 3) Eat low salt foods-Limit salt (sodium) to 2000 mg per day.  4) Stay as active as you can everyday 5) Limit all fluids for the day to less than 2 liters

## 2015-10-22 DIAGNOSIS — I272 Pulmonary hypertension, unspecified: Secondary | ICD-10-CM | POA: Insufficient documentation

## 2015-10-22 NOTE — Progress Notes (Signed)
Patient ID: April Mueller, female   DOB: 04-03-1946, 70 y.o.   MRN: [id number]   HPI  PCP: Dr Harrington Challenger Pulmonary: Dr Byrum/Dr Melvyn Novas   [name] is a 70 yo with following PMHx  1) COPD       --former smoker (28 pk-yrs)       --on home O2 - 4L/min rest, up to 5L/min exertion.        --spirometry 6/13 in [place] = 0.85L FVC = 1.3L no DLCO measured. FEV1 in 2014 = 0.73        --CT chest 10/14 - moderate centrilobular emphysema. No pulm fibrosis but there was evidence for post-infectious/inflammatory scarring. Stable Asc Ao repair       --PFTs (11/14) FVC 46%, FEV1 0.8L (40%), ratio 87%, FEF 25-75% 0.44L DLCO 22%, TLC 50% => PFTs in 06/2013 did NOT suggest significant airflow obstruction according to Dr Gustavus Bryant last note despite "moderate emphysema" on CT.        --PFTs (6/16) were abnormal with severe obstruction, severe restriction, severely decreased DLCO.  Reviewed with Dr Melvyn Novas => he thinks obstruction is actually fairly minimal and that the restriction is due to body habitus.   2) Obesity 3) RA       --on plaquenil, sees Dr Lenna Gilford.  4) Type A aortic dissection s/p repair 2005     --c/b renal failure requiring HD x 3 months 5) PAH - previously treated in [place].  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      --on macitentan (revatio stopped 10/14).     --V/Q scan 11/14 no chronic PE     -- 2015 Tracleer stopped and started on macitentan. 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  6) OSA 7) Diastolic CHF    --ECHO [id number] EF 55-60% RV mildly dilated with normal function. Mild to moderate posterior MR. RVSP 56. Probable restrictive diastolic filling pattern    --ECHO 9/15 EF 60-65% RV mildly dilated with normal function. Mild to moderate posterior MR. RVSP 40. Ao Root ok    --TEE 6/16 with EF 55-60%, D-shaped interventricular septum suggestive of RV pressure/volume overload, RV mildly dilated with normal systolic function, peak RV-RA gradient 57 mmHg, very eccentric  posteriorly-directed mitral regurgitation, possibly severe, etiology may be a very small area of prolapse on the anterior leaflet, s/p ascending aorta repair with residual dissection flap in the descending thoracic aorta.       --Echo (1/17) with EF 55-60%, mild to moderate MR, PASP 66 8) CKD 9) RHC 08/18/2014: No role for pulmonary vasodilators.  RA = 14 RV = 62/3/15 PA = 63/21 (38) PCW = mean 23 v waves to 40 Fick cardiac output/index = 6.3/3.2 Thermodilution CO/CI = 6.1/3.1, PVR = 2.0 WU, FA sat = 91% PA sat = 57%, 62% 10) Mitral regurgitation: Possibly severe by 6/16 TEE, very eccentric.  Not good candidate for percutaneous MV clip (seen at [facility]).  Echo (1/17) with only mild to moderate MR.  11) Anemia: Suspect from chronic disease/renal disease but has been found to be FOBT+.  EGD/colonoscopy/capsule endoscopy in 1/17 did not show any definite site for blood loss. 12) Atrial fibrillation: Persistent, 1st noted in 1/17.   Moved back from [place] and saw Dr. Lamonte Sakai. In [place] was followed by cardiology and pulmonary. Started on Tracleer in 2006. Revatio was added, however was discontinued without any change in her symptoms. Stopped smoking in January 2005 when she had aortic aneurysm repair. Smoked 1.5-2 ppd x 15 -  20 years.  Now on macitentan.  I had her do a TEE in 6/16.  She had a loud MR murmur.  TEE showed very eccentric, posteriorly-directed MR that may be from subtle prolapse of the anterior mitral leaflet.  I am concerned that the MR could be severe but very difficult to fully visualize.  She additionally had PFTs done that were abnormal, but according to her pulmonologist Melvyn Novas), the obstruction was really only minimal and the restriction was primarily related to body habitus.  He does not think that we can explain her hypoxemia and dyspnea primarily from parenchymal lung pathology.  I sent her to [facility] for evaluation for percutaneous mitral valve clip.  They did not think that she was a good candidate.    She has been anemic and received 1 unit PRBCs in 8/16.  She saw GI and was noted to be heme negative, no scopes were done (heme negative and high risk). She has had iron infusions.   Opsumit was stopped in 9/16 due to suspicion for group 2 pulmonary hypertension only (low PVR) and she felt better off it.   She was admitted in 1/17 with increased dyspnea and was found to be profoundly anemic.  She had a total of 5 units PRBCs and an iron infusion in the hospital.  Stool was FOBT+, melena.  EGD, colonoscopy, and capsule endoscopy were all done without clear source of blood loss.  While in the hospital, she was noted to go into atrial fibrillation with RVR.  She initially required amiodarone for rate control, but was transitioned over to Toprol XL. She was not anticoagulated due to concern for GI blood loss and anemia.    She returns for followup today.  She remains in atrial fibrillation.  She is now on torsemide due to volume overload. Weight is down 5 lbs. She remains short of breath after walking about 100 feet, stable.  Overall, feels ok. No BRBPR or melena.  No chest pain.  Stable orthopnea.  Getting monthly IV iron via hematology.  Using CPAP at night and oxygen during the day. She was seen by Dr Rayann Heman, he recommended against Watchman and suggested trying her on Eliquis in the future.   Labs 02/05/13 K 3.8 Creatinine 1.37 05/08/13 K 3.8, Creatinine 1.58 11/14 K 4.1, creatinine 1.82 08/09/2014: K 4.0 Creatinine 1.45  5/16: K 3.7, creatinine 1.76, BNP 447 6/16: K 5 => 4.6, creatinine 2.09 => 2.13, BNP 291 8/16: K 4.7, creatinine 2.1, HCT 30.7 9/16: K 4.5, creatinine 2.3, HCT 34.1 1/17: K 4.8, creatinine 1.84 => 1.87, HCT 29.7, plts 136, FOBT+, BNP 403 2/17: K 3.5, creatinine 1.89, BNP 329, hgb 10 3/17: K 3.8, creatinine 2.5, HCT 35.4  ROS: All systems reviewed and negative except as per HPI.  Current Outpatient Prescriptions on File Prior to Encounter  Medication Sig Dispense Refill  .  acetaminophen (TYLENOL) 325 MG tablet Take 2 tablets (650 mg total) by mouth every 6 (six) hours as needed for mild pain (or Fever >/= 101).    [name] allopurinol (ZYLOPRIM) 300 MG tablet Take 300 mg by mouth daily.    [name] ALPRAZolam (XANAX) 0.25 MG tablet Take 0.25 mg by mouth 2 (two) times daily as needed for sleep.     [name] atorvastatin (LIPITOR) 10 MG tablet Take 1 tablet (10 mg total) by mouth daily. 90 tablet 3  . b complex vitamins tablet Take 1 tablet by mouth daily.    [name] FLUOCINOLONE ACETONIDE BODY 0.01 % OIL Apply  1 mL topically as needed (for flares).   3  . hydroxychloroquine (PLAQUENIL) 200 MG tablet Take 200 mg by mouth 2 (two) times daily.    [name] ketoconazole (NIZORAL) 2 % shampoo Apply 1 application topically as needed for irritation. Leave on 5 minutes before rinsing out  3  . lidocaine (LIDODERM) 5 % Place 1 patch onto the skin daily as needed (for pain).     . metoprolol succinate (TOPROL-XL) 25 MG 24 hr tablet Take 1 tablet (25 mg total) by mouth 2 (two) times daily. 180 tablet 1  . Omega-3 Fatty Acids (FISH OIL) 1200 MG CAPS Take 1,200 mg by mouth daily.    . OXYGEN Inhale 3-6 L into the lungs continuous.     . potassium chloride SA (KLOR-CON M20) 20 MEQ tablet Take 1 tablet (20 mEq total) by mouth daily. 30 tablet 6  . torsemide (DEMADEX) 20 MG tablet Take 4 tablets (80 mg total) by mouth daily. (Patient taking differently: Take 40 mg by mouth 2 (two) times daily. ) 120 tablet 3  . umeclidinium-vilanterol (ANORO ELLIPTA) 62.5-25 MCG/INH AEPB Inhale 1 puff into the lungs daily.     No current facility-administered medications on file prior to encounter.   Social History   Social History  . Marital Status: Legally Separated    Spouse Name: N/A  . Number of Children: 1  . Years of Education: N/A   Occupational History  . Disability     Office Work   Social History Main Topics  . Smoking status: Former Smoker -- 1.50 packs/day for 14 years    Types: Cigarettes    Quit date:  08/09/2003  . Smokeless tobacco: Never Used  . Alcohol Use: No  . Drug Use: No  . Sexual Activity: Not Currently   Other Topics Concern  . Not on file   Social History Narrative   Family History  Problem Relation Age of Onset  . Heart attack Mother   . Prostate cancer Father   . Diabetes Brother   . Kidney failure Brother     Filed Vitals:   10/21/15 1003  BP: 110/72  Pulse: 75  Weight: 214 lb (97.07 kg)  SpO2: 97%   Physical Exam Gen: Pleasant, well-nourished, in no distress, normal affect; on O2 by .  HEENT: normal Neck: JVP 8-9 cm. Lungs: CTAB  Cardiovascular: well healed sternotomy scar  PMI normal. Irregular S1S2, no S3/S4, 3/6 HSM LLSB/apex.  Extremities: No deformities, no cyanosis or clubbing, no edema.  Neuro: alert, non focal. Cranial nerves intact. Moves all 4 extremities without difficulty Skin: Warm, no lesions or rashes  Assessment/Plan: 1. 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  This appeared to be primarily pulmonary venous hypertension.  Interestingly, there were very prominent V waves in the PCWP tracing. On exam, she has a prominent MR murmur.  I was concerned that significant MR may be playing a major role in her symptomatology. I did a TEE in 6/16 showing very eccentric, posteriorly-directed mitral regurgitation possibly from subtle anterior leaflet prolapse.  I think that the MR could be severe but is difficult to fully visualize.  She has significant COPD to help explain her hypoxemia and dyspnea. She was seen at [facility] for consideration of percutaneous MV clip and was not thought to be a good candidate => concern that MV disease may not actually be severe and concern that her  dyspnea is coming from COPD and would not be improved by MV clipping.  Most recent echo in 1/17 showed normal LV EF and only visualized mild to moderate  MR.  She has been volume overloaded recently in the setting of the onset of persistent atrial fibrillation.  At last appointment, Lasix was stopped and torsemide was started.  Volume status is improved by exam and weight is down.  NYHA class III symptoms, stable.  Creatinine did rise some with switch to torsemide.  - Continue torsemide 80 mg daily but she will need repeta BMET today. - I am going to arrange for RHC to assess filling pressures as well as assess for PAH now that volume status is improved.  We discussed risks/benefits of the procedure and she agrees to it.     2. Chronic respiratory failure:  She is on 4-5 L home oxygen by [place]. She has COPD and restriction from her body habitus.  3. OSA: Continue CPAP nightly.  4. CKD: Stage III-IV.  She has been referred to nephrology.  Repeat BMET. 5. COPD: See #2 above, per pulmonary.     6. Aortic dissection s/p repair 2005: Although complete records are not available, it appears that the patient developed a Type A dissection in 2005 with ascending aorta repair (can see the surgically repaired ascending aorta well on TEE).  She has residual dissection in the descending thoracic aorta.  7. Pulmonary hypertension: I am concerned that this is primarily WHO group 2 (pulmonary venous hypertension) and group 3 (PH due to intrinsic lung disease) rather than primarily WHO group 1 PAH. RHC in 1/16 showed low PVR.  She is now off macitentan.   - Now that she is better diuresed, I am going to repeat RHC to reassess pulmonary hypertension. 8. Mitral regurgitation:  TEE concerning for possibly severe, eccentric mitral regurgitation, most likely due to subtle prolapse of the anterior mitral valve leaflet.  I have been concerned that the MR is playing a role in her CHF. She had prominent V-waves on RHC in 1/16, and she has a loud mitral area murmur.  I do not think that she is a good candidate for open MV surgery. I had her evaluated at [facility] for possible percutaneous  mitral clipping.  She was turned down for this as her MR was not clearly severe and fixing it may not help her hypoxemia appreciably.  Of note, most recent echo in 1/17 actually visualized only mild to moderate MR.   9. Anemia: Suspect anemia of chronic disease/renal disease but also with component of GI blood loss. Recent admission requiring 5 units PRBCs.  She was FOBT+, but unable to visualized bleeding source on EGD, colonoscopy, or capsule endoscopy.  Seeing hematology, IV iron monthly.  10. Atrial fibrillation: Persistent.  Rate is controlled.  I suspect that this has led to worsening of her CHF.  She is not anticoagulated given recent admission for severe anemia and need for 5 units PRBCs.  Site of blood loss could not be visualized on GI evaluation.  CHADSVASC =3.  She was evaluated for Watchman but decided not to be a good candidate. Recent CBC showed good hemoglobin. - She is several weeks out now from presumed GI bleed.  I am going to try her on Eliquis after RHC. Will need to follow hgb closely.     Followup in 1 month.   [name] 10/22/2015

## 2015-10-24 ENCOUNTER — Ambulatory Visit (HOSPITAL_COMMUNITY)
Admission: RE | Admit: 2015-10-24 | Discharge: 2015-10-24 | Disposition: A | Discharge status: Home / Self Care | Payer: Medicare Other | Source: Ambulatory Visit | Attending: Cardiology | Admitting: Cardiology

## 2015-10-24 ENCOUNTER — Telehealth (HOSPITAL_COMMUNITY): Payer: Self-pay | Admitting: Pharmacist

## 2015-10-24 ENCOUNTER — Other Ambulatory Visit (HOSPITAL_COMMUNITY): Payer: Self-pay | Admitting: *Deleted

## 2015-10-24 ENCOUNTER — Other Ambulatory Visit (HOSPITAL_COMMUNITY): Payer: Self-pay | Admitting: Pharmacist

## 2015-10-24 ENCOUNTER — Encounter (HOSPITAL_COMMUNITY)
Admission: RE | Disposition: A | Discharge status: Home / Self Care | Payer: Self-pay | Source: Ambulatory Visit | Attending: Cardiology

## 2015-10-24 ENCOUNTER — Encounter (HOSPITAL_COMMUNITY): Payer: Self-pay | Admitting: Cardiology

## 2015-10-24 DIAGNOSIS — I270 Primary pulmonary hypertension: Secondary | ICD-10-CM | POA: Diagnosis not present

## 2015-10-24 DIAGNOSIS — Z87891 Personal history of nicotine dependence: Secondary | ICD-10-CM | POA: Insufficient documentation

## 2015-10-24 DIAGNOSIS — I130 Hypertensive heart and chronic kidney disease with heart failure and stage 1 through stage 4 chronic kidney disease, or unspecified chronic kidney disease: Secondary | ICD-10-CM | POA: Diagnosis not present

## 2015-10-24 DIAGNOSIS — I340 Nonrheumatic mitral (valve) insufficiency: Secondary | ICD-10-CM | POA: Diagnosis not present

## 2015-10-24 DIAGNOSIS — I272 Other secondary pulmonary hypertension: Secondary | ICD-10-CM | POA: Diagnosis present

## 2015-10-24 DIAGNOSIS — Z9981 Dependence on supplemental oxygen: Secondary | ICD-10-CM | POA: Diagnosis not present

## 2015-10-24 DIAGNOSIS — Z6837 Body mass index (BMI) 37.0-37.9, adult: Secondary | ICD-10-CM | POA: Insufficient documentation

## 2015-10-24 DIAGNOSIS — N189 Chronic kidney disease, unspecified: Secondary | ICD-10-CM | POA: Insufficient documentation

## 2015-10-24 DIAGNOSIS — J9610 Chronic respiratory failure, unspecified whether with hypoxia or hypercapnia: Secondary | ICD-10-CM | POA: Insufficient documentation

## 2015-10-24 DIAGNOSIS — M069 Rheumatoid arthritis, unspecified: Secondary | ICD-10-CM | POA: Insufficient documentation

## 2015-10-24 DIAGNOSIS — E669 Obesity, unspecified: Secondary | ICD-10-CM | POA: Diagnosis not present

## 2015-10-24 DIAGNOSIS — I5032 Chronic diastolic (congestive) heart failure: Secondary | ICD-10-CM | POA: Insufficient documentation

## 2015-10-24 DIAGNOSIS — Z8249 Family history of ischemic heart disease and other diseases of the circulatory system: Secondary | ICD-10-CM | POA: Diagnosis not present

## 2015-10-24 DIAGNOSIS — D649 Anemia, unspecified: Secondary | ICD-10-CM | POA: Insufficient documentation

## 2015-10-24 DIAGNOSIS — G4733 Obstructive sleep apnea (adult) (pediatric): Secondary | ICD-10-CM | POA: Diagnosis not present

## 2015-10-24 DIAGNOSIS — J449 Chronic obstructive pulmonary disease, unspecified: Secondary | ICD-10-CM | POA: Diagnosis not present

## 2015-10-24 DIAGNOSIS — I481 Persistent atrial fibrillation: Secondary | ICD-10-CM | POA: Insufficient documentation

## 2015-10-24 HISTORY — PX: CARDIAC CATHETERIZATION: SHX172

## 2015-10-24 LAB — PROTIME-INR
INR: 1.22 (ref 0.00–1.49)
PROTHROMBIN TIME: 15.5 s — AB (ref 11.6–15.2)

## 2015-10-24 LAB — POCT I-STAT 3, VENOUS BLOOD GAS (G3P V)
Acid-Base Excess: 5 mmol/L — ABNORMAL HIGH (ref 0.0–2.0)
BICARBONATE: 31.1 meq/L — AB (ref 20.0–24.0)
O2 SAT: 65 %
PCO2 VEN: 53.1 mmHg — AB (ref 45.0–50.0)
PO2 VEN: 35 mmHg (ref 31.0–45.0)
TCO2: 33 mmol/L (ref 0–100)
pH, Ven: 7.376 — ABNORMAL HIGH (ref 7.250–7.300)

## 2015-10-24 SURGERY — RIGHT HEART CATH
Anesthesia: LOCAL

## 2015-10-24 MED ORDER — SODIUM CHLORIDE 0.9% FLUSH: 3.0000 mL | Freq: Two times a day (BID) | INTRAVENOUS | Status: DC

## 2015-10-24 MED ORDER — SODIUM CHLORIDE 0.9 % IV SOLN: 250.0000 mL | INTRAVENOUS | Status: DC | PRN

## 2015-10-24 MED ORDER — ONDANSETRON HCL 4 MG/2ML IJ SOLN: 4.0000 mg | Freq: Four times a day (QID) | INTRAMUSCULAR | Status: DC | PRN

## 2015-10-24 MED ORDER — ACETAMINOPHEN 325 MG PO TABS: 650.0000 mg | ORAL_TABLET | ORAL | Status: DC | PRN

## 2015-10-24 MED ORDER — FENTANYL CITRATE (PF) 100 MCG/2ML IJ SOLN
INTRAMUSCULAR | Status: AC
Start: 2015-10-24 — End: 2015-10-24
  Filled 2015-10-24: qty 2

## 2015-10-24 MED ORDER — MIDAZOLAM HCL 2 MG/2ML IJ SOLN
INTRAMUSCULAR | Status: DC | PRN
  Administered 2015-10-24: 1 mg via INTRAVENOUS

## 2015-10-24 MED ORDER — APIXABAN 5 MG PO TABS: 5.0000 mg | ORAL_TABLET | Freq: Two times a day (BID) | ORAL | Status: DC

## 2015-10-24 MED ORDER — LIDOCAINE HCL (PF) 1 % IJ SOLN
INTRAMUSCULAR | Status: DC | PRN
  Administered 2015-10-24: 2 mL

## 2015-10-24 MED ORDER — MIDAZOLAM HCL 2 MG/2ML IJ SOLN
INTRAMUSCULAR | Status: AC
  Filled 2015-10-24: qty 2

## 2015-10-24 MED ORDER — SODIUM CHLORIDE 0.9 % IV SOLN
INTRAVENOUS | Status: DC
  Administered 2015-10-24: 08:00:00 via INTRAVENOUS

## 2015-10-24 MED ORDER — SODIUM CHLORIDE 0.9% FLUSH: 3.0000 mL | INTRAVENOUS | Status: DC | PRN

## 2015-10-24 MED ORDER — FENTANYL CITRATE (PF) 100 MCG/2ML IJ SOLN
INTRAMUSCULAR | Status: DC | PRN
  Administered 2015-10-24: 25 ug via INTRAVENOUS

## 2015-10-24 MED ORDER — HEPARIN (PORCINE) IN NACL 2-0.9 UNIT/ML-% IJ SOLN
INTRAMUSCULAR | Status: DC | PRN
  Administered 2015-10-24: 500 mL

## 2015-10-24 MED ORDER — ASPIRIN 81 MG PO CHEW: 81.0000 mg | CHEWABLE_TABLET | ORAL | Status: DC

## 2015-10-24 MED ORDER — HEPARIN (PORCINE) IN NACL 2-0.9 UNIT/ML-% IJ SOLN
INTRAMUSCULAR | Status: AC
  Filled 2015-10-24: qty 500

## 2015-10-24 MED ORDER — LIDOCAINE HCL (PF) 1 % IJ SOLN
INTRAMUSCULAR | Status: AC
  Filled 2015-10-24: qty 30

## 2015-10-24 SURGICAL SUPPLY — 6 items
CATH BALLN WEDGE 5F 110CM (CATHETERS) ×2 IMPLANT
GUIDEWIRE .025 260CM (WIRE) ×2 IMPLANT
PACK CARDIAC CATHETERIZATION (CUSTOM PROCEDURE TRAY) ×2 IMPLANT
SHEATH FAST CATH BRACH 5F 5CM (SHEATH) ×2 IMPLANT
TRANSDUCER W/STOPCOCK (MISCELLANEOUS) ×2 IMPLANT
TUBING ART PRESS 72  MALE/MALE (TUBING) ×2 IMPLANT

## 2015-10-24 NOTE — H&P (View-Only) (Signed)
Patient ID: GIRDIE FISCH, female   DOB: Mar 26, 1946, 70 y.o.   MRN: [id number]   HPI  PCP: Dr Harrington Challenger Pulmonary: Dr Byrum/Dr Melvyn Novas   Ms Mackins is a 70 yo with following PMHx  1) COPD       --former smoker (28 pk-yrs)       --on home O2 - 4L/min rest, up to 5L/min exertion.        --spirometry 6/13 in [place] = 0.85L FVC = 1.3L no DLCO measured. FEV1 in 2014 = 0.73        --CT chest 10/14 - moderate centrilobular emphysema. No pulm fibrosis but there was evidence for post-infectious/inflammatory scarring. Stable Asc Ao repair       --PFTs (11/14) FVC 46%, FEV1 0.8L (40%), ratio 87%, FEF 25-75% 0.44L DLCO 22%, TLC 50% => PFTs in 06/2013 did NOT suggest significant airflow obstruction according to Dr Gustavus Bryant last note despite "moderate emphysema" on CT.        --PFTs (6/16) were abnormal with severe obstruction, severe restriction, severely decreased DLCO.  Reviewed with Dr Melvyn Novas => he thinks obstruction is actually fairly minimal and that the restriction is due to body habitus.   2) Obesity 3) RA       --on plaquenil, sees Dr Lenna Gilford.  4) Type A aortic dissection s/p repair 2005     --c/b renal failure requiring HD x 3 months 5) PAH - previously treated in [place].  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      --on macitentan (revatio stopped 10/14).     --V/Q scan 11/14 no chronic PE     -- 2015 Tracleer stopped and started on macitentan. 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  6) OSA 7) Diastolic CHF    --ECHO [id number] EF 55-60% RV mildly dilated with normal function. Mild to moderate posterior MR. RVSP 56. Probable restrictive diastolic filling pattern    --ECHO 9/15 EF 60-65% RV mildly dilated with normal function. Mild to moderate posterior MR. RVSP 40. Ao Root ok    --TEE 6/16 with EF 55-60%, D-shaped interventricular septum suggestive of RV pressure/volume overload, RV mildly dilated with normal systolic function, peak RV-RA gradient 57 mmHg, very eccentric  posteriorly-directed mitral regurgitation, possibly severe, etiology may be a very small area of prolapse on the anterior leaflet, s/p ascending aorta repair with residual dissection flap in the descending thoracic aorta.       --Echo (1/17) with EF 55-60%, mild to moderate MR, PASP 66 8) CKD 9) RHC 08/18/2014: No role for pulmonary vasodilators.  RA = 14 RV = 62/3/15 PA = 63/21 (38) PCW = mean 23 v waves to 40 Fick cardiac output/index = 6.3/3.2 Thermodilution CO/CI = 6.1/3.1, PVR = 2.0 WU, FA sat = 91% PA sat = 57%, 62% 10) Mitral regurgitation: Possibly severe by 6/16 TEE, very eccentric.  Not good candidate for percutaneous MV clip (seen at [facility]).  Echo (1/17) with only mild to moderate MR.  11) Anemia: Suspect from chronic disease/renal disease but has been found to be FOBT+.  EGD/colonoscopy/capsule endoscopy in 1/17 did not show any definite site for blood loss. 12) Atrial fibrillation: Persistent, 1st noted in 1/17.   Moved back from [place] and saw Dr. Lamonte Sakai. In [place] was followed by cardiology and pulmonary. Started on Tracleer in 2006. Revatio was added, however was discontinued without any change in her symptoms. Stopped smoking in January 2005 when she had aortic aneurysm repair. Smoked 1.5-2 ppd x 15 -  20 years.  Now on macitentan.  I had her do a TEE in 6/16.  She had a loud MR murmur.  TEE showed very eccentric, posteriorly-directed MR that may be from subtle prolapse of the anterior mitral leaflet.  I am concerned that the MR could be severe but very difficult to fully visualize.  She additionally had PFTs done that were abnormal, but according to her pulmonologist Melvyn Novas), the obstruction was really only minimal and the restriction was primarily related to body habitus.  He does not think that we can explain her hypoxemia and dyspnea primarily from parenchymal lung pathology.  I sent her to [facility] for evaluation for percutaneous mitral valve clip.  They did not think that she was a good candidate.    She has been anemic and received 1 unit PRBCs in 8/16.  She saw GI and was noted to be heme negative, no scopes were done (heme negative and high risk). She has had iron infusions.   Opsumit was stopped in 9/16 due to suspicion for group 2 pulmonary hypertension only (low PVR) and she felt better off it.   She was admitted in 1/17 with increased dyspnea and was found to be profoundly anemic.  She had a total of 5 units PRBCs and an iron infusion in the hospital.  Stool was FOBT+, melena.  EGD, colonoscopy, and capsule endoscopy were all done without clear source of blood loss.  While in the hospital, she was noted to go into atrial fibrillation with RVR.  She initially required amiodarone for rate control, but was transitioned over to Toprol XL. She was not anticoagulated due to concern for GI blood loss and anemia.    She returns for followup today.  She remains in atrial fibrillation.  She is now on torsemide due to volume overload. Weight is down 5 lbs. She remains short of breath after walking about 100 feet, stable.  Overall, feels ok. No BRBPR or melena.  No chest pain.  Stable orthopnea.  Getting monthly IV iron via hematology.  Using CPAP at night and oxygen during the day. She was seen by Dr Rayann Heman, he recommended against Watchman and suggested trying her on Eliquis in the future.   Labs 02/05/13 K 3.8 Creatinine 1.37 05/08/13 K 3.8, Creatinine 1.58 11/14 K 4.1, creatinine 1.82 08/09/2014: K 4.0 Creatinine 1.45  5/16: K 3.7, creatinine 1.76, BNP 447 6/16: K 5 => 4.6, creatinine 2.09 => 2.13, BNP 291 8/16: K 4.7, creatinine 2.1, HCT 30.7 9/16: K 4.5, creatinine 2.3, HCT 34.1 1/17: K 4.8, creatinine 1.84 => 1.87, HCT 29.7, plts 136, FOBT+, BNP 403 2/17: K 3.5, creatinine 1.89, BNP 329, hgb 10 3/17: K 3.8, creatinine 2.5, HCT 35.4  ROS: All systems reviewed and negative except as per HPI.  Current Outpatient Prescriptions on File Prior to Encounter  Medication Sig Dispense Refill  .  acetaminophen (TYLENOL) 325 MG tablet Take 2 tablets (650 mg total) by mouth every 6 (six) hours as needed for mild pain (or Fever >/= 101).    [name] allopurinol (ZYLOPRIM) 300 MG tablet Take 300 mg by mouth daily.    [name] ALPRAZolam (XANAX) 0.25 MG tablet Take 0.25 mg by mouth 2 (two) times daily as needed for sleep.     [name] atorvastatin (LIPITOR) 10 MG tablet Take 1 tablet (10 mg total) by mouth daily. 90 tablet 3  . b complex vitamins tablet Take 1 tablet by mouth daily.    [name] FLUOCINOLONE ACETONIDE BODY 0.01 % OIL Apply  1 mL topically as needed (for flares).   3  . hydroxychloroquine (PLAQUENIL) 200 MG tablet Take 200 mg by mouth 2 (two) times daily.    [name] ketoconazole (NIZORAL) 2 % shampoo Apply 1 application topically as needed for irritation. Leave on 5 minutes before rinsing out  3  . lidocaine (LIDODERM) 5 % Place 1 patch onto the skin daily as needed (for pain).     . metoprolol succinate (TOPROL-XL) 25 MG 24 hr tablet Take 1 tablet (25 mg total) by mouth 2 (two) times daily. 180 tablet 1  . Omega-3 Fatty Acids (FISH OIL) 1200 MG CAPS Take 1,200 mg by mouth daily.    . OXYGEN Inhale 3-6 L into the lungs continuous.     . potassium chloride SA (KLOR-CON M20) 20 MEQ tablet Take 1 tablet (20 mEq total) by mouth daily. 30 tablet 6  . torsemide (DEMADEX) 20 MG tablet Take 4 tablets (80 mg total) by mouth daily. (Patient taking differently: Take 40 mg by mouth 2 (two) times daily. ) 120 tablet 3  . umeclidinium-vilanterol (ANORO ELLIPTA) 62.5-25 MCG/INH AEPB Inhale 1 puff into the lungs daily.     No current facility-administered medications on file prior to encounter.   Social History   Social History  . Marital Status: Legally Separated    Spouse Name: N/A  . Number of Children: 1  . Years of Education: N/A   Occupational History  . Disability     Office Work   Social History Main Topics  . Smoking status: Former Smoker -- 1.50 packs/day for 14 years    Types: Cigarettes    Quit date:  08/09/2003  . Smokeless tobacco: Never Used  . Alcohol Use: No  . Drug Use: No  . Sexual Activity: Not Currently   Other Topics Concern  . Not on file   Social History Narrative   Family History  Problem Relation Age of Onset  . Heart attack Mother   . Prostate cancer Father   . Diabetes Brother   . Kidney failure Brother     Filed Vitals:   10/21/15 1003  BP: 110/72  Pulse: 75  Weight: 214 lb (97.07 kg)  SpO2: 97%   Physical Exam Gen: Pleasant, well-nourished, in no distress, normal affect; on O2 by .  HEENT: normal Neck: JVP 8-9 cm. Lungs: CTAB  Cardiovascular: well healed sternotomy scar  PMI normal. Irregular S1S2, no S3/S4, 3/6 HSM LLSB/apex.  Extremities: No deformities, no cyanosis or clubbing, no edema.  Neuro: alert, non focal. Cranial nerves intact. Moves all 4 extremities without difficulty Skin: Warm, no lesions or rashes  Assessment/Plan: 1. 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  This appeared to be primarily pulmonary venous hypertension.  Interestingly, there were very prominent V waves in the PCWP tracing. On exam, she has a prominent MR murmur.  I was concerned that significant MR may be playing a major role in her symptomatology. I did a TEE in 6/16 showing very eccentric, posteriorly-directed mitral regurgitation possibly from subtle anterior leaflet prolapse.  I think that the MR could be severe but is difficult to fully visualize.  She has significant COPD to help explain her hypoxemia and dyspnea. She was seen at [facility] for consideration of percutaneous MV clip and was not thought to be a good candidate => concern that MV disease may not actually be severe and concern that her  dyspnea is coming from COPD and would not be improved by MV clipping.  Most recent echo in 1/17 showed normal LV EF and only visualized mild to moderate  MR.  She has been volume overloaded recently in the setting of the onset of persistent atrial fibrillation.  At last appointment, Lasix was stopped and torsemide was started.  Volume status is improved by exam and weight is down.  NYHA class III symptoms, stable.  Creatinine did rise some with switch to torsemide.  - Continue torsemide 80 mg daily but she will need repeta BMET today. - I am going to arrange for RHC to assess filling pressures as well as assess for PAH now that volume status is improved.  We discussed risks/benefits of the procedure and she agrees to it.     2. Chronic respiratory failure:  She is on 4-5 L home oxygen by [place]. She has COPD and restriction from her body habitus.  3. OSA: Continue CPAP nightly.  4. CKD: Stage III-IV.  She has been referred to nephrology.  Repeat BMET. 5. COPD: See #2 above, per pulmonary.     6. Aortic dissection s/p repair 2005: Although complete records are not available, it appears that the patient developed a Type A dissection in 2005 with ascending aorta repair (can see the surgically repaired ascending aorta well on TEE).  She has residual dissection in the descending thoracic aorta.  7. Pulmonary hypertension: I am concerned that this is primarily WHO group 2 (pulmonary venous hypertension) and group 3 (PH due to intrinsic lung disease) rather than primarily WHO group 1 PAH. RHC in 1/16 showed low PVR.  She is now off macitentan.   - Now that she is better diuresed, I am going to repeat RHC to reassess pulmonary hypertension. 8. Mitral regurgitation:  TEE concerning for possibly severe, eccentric mitral regurgitation, most likely due to subtle prolapse of the anterior mitral valve leaflet.  I have been concerned that the MR is playing a role in her CHF. She had prominent V-waves on RHC in 1/16, and she has a loud mitral area murmur.  I do not think that she is a good candidate for open MV surgery. I had her evaluated at [facility] for possible percutaneous  mitral clipping.  She was turned down for this as her MR was not clearly severe and fixing it may not help her hypoxemia appreciably.  Of note, most recent echo in 1/17 actually visualized only mild to moderate MR.   9. Anemia: Suspect anemia of chronic disease/renal disease but also with component of GI blood loss. Recent admission requiring 5 units PRBCs.  She was FOBT+, but unable to visualized bleeding source on EGD, colonoscopy, or capsule endoscopy.  Seeing hematology, IV iron monthly.  10. Atrial fibrillation: Persistent.  Rate is controlled.  I suspect that this has led to worsening of her CHF.  She is not anticoagulated given recent admission for severe anemia and need for 5 units PRBCs.  Site of blood loss could not be visualized on GI evaluation.  CHADSVASC =3.  She was evaluated for Watchman but decided not to be a good candidate. Recent CBC showed good hemoglobin. - She is several weeks out now from presumed GI bleed.  I am going to try her on Eliquis after RHC. Will need to follow hgb closely.     Followup in 1 month.   [name] 10/22/2015

## 2015-10-24 NOTE — Addendum Note (Signed)
Addended by: Scarlette Calico on: 10/24/2015 03:28 PM   Modules accepted: Orders

## 2015-10-24 NOTE — Discharge Instructions (Signed)
Call if any problems,questions, or concerns; call if any bleeding,drainage,fever,pain,redness, or swelling right arm site

## 2015-10-24 NOTE — Telephone Encounter (Signed)
Spoke with April Mueller about initiation of Eliquis. She was provided with 30-day free manufacturer coupon card and she will bring in [place] income verification and pharmacy print out of how much she has spent on Rx's this year thus far. She needs to have spent 3% of her yearly income on prescriptions this year before she is eligible.   [name]. Velva Harman, PharmD, BCPS, CPP Clinical Pharmacist Pager: [phone] Phone: [phone] 10/24/2015 2:44 PM

## 2015-10-24 NOTE — Interval H&P Note (Signed)
History and Physical Interval Note:  10/24/2015 10:05 AM  April Mueller  has presented today for surgery, with the diagnosis of chf  The various methods of treatment have been discussed with the patient and family. After consideration of risks, benefits and other options for treatment, the patient has consented to  Procedure(s): Right Heart Cath (N/A) as a surgical intervention .  The patient's history has been reviewed, patient examined, no change in status, stable for surgery.  I have reviewed the patient's chart and labs.  Questions were answered to the patient's satisfaction.     Ott Zimmerle [employer]

## 2015-10-25 LAB — POCT I-STAT 3, VENOUS BLOOD GAS (G3P V)
ACID-BASE EXCESS: 6 mmol/L — AB (ref 0.0–2.0)
Bicarbonate: 32.7 mEq/L — ABNORMAL HIGH (ref 20.0–24.0)
O2 Saturation: 67 %
PH VEN: 7.387 — AB (ref 7.250–7.300)
TCO2: 34 mmol/L (ref 0–100)
pCO2, Ven: 54.4 mmHg — ABNORMAL HIGH (ref 45.0–50.0)
pO2, Ven: 36 mmHg (ref 31.0–45.0)

## 2015-11-01 ENCOUNTER — Telehealth: Payer: Self-pay | Admitting: Emergency Medicine

## 2015-11-01 NOTE — Telephone Encounter (Signed)
Called and spoke to pt. Pt is requesting an Anoro sample to be mailed to her, home address verified. Anoro placed in out going mail. Pt verbalized understanding and denied any further questions or concerns at this time.

## 2015-11-03 ENCOUNTER — Ambulatory Visit (HOSPITAL_BASED_OUTPATIENT_CLINIC_OR_DEPARTMENT_OTHER): Payer: Medicare Other

## 2015-11-03 ENCOUNTER — Ambulatory Visit: Payer: Medicare Other

## 2015-11-03 ENCOUNTER — Other Ambulatory Visit (HOSPITAL_BASED_OUTPATIENT_CLINIC_OR_DEPARTMENT_OTHER): Payer: Medicare Other

## 2015-11-03 ENCOUNTER — Telehealth: Payer: Self-pay | Admitting: Hematology

## 2015-11-03 ENCOUNTER — Encounter: Payer: Self-pay | Admitting: Hematology

## 2015-11-03 ENCOUNTER — Ambulatory Visit (HOSPITAL_BASED_OUTPATIENT_CLINIC_OR_DEPARTMENT_OTHER): Payer: Medicare Other | Admitting: Hematology

## 2015-11-03 VITALS — BP 111/50 | HR 63 | Temp 98.1°F | Resp 18 | Ht 64.0 in | Wt 217.3 lb

## 2015-11-03 VITALS — BP 106/69 | HR 59 | Temp 97.2°F

## 2015-11-03 DIAGNOSIS — M069 Rheumatoid arthritis, unspecified: Secondary | ICD-10-CM

## 2015-11-03 DIAGNOSIS — D631 Anemia in chronic kidney disease: Secondary | ICD-10-CM | POA: Diagnosis not present

## 2015-11-03 DIAGNOSIS — D500 Iron deficiency anemia secondary to blood loss (chronic): Secondary | ICD-10-CM

## 2015-11-03 DIAGNOSIS — D508 Other iron deficiency anemias: Secondary | ICD-10-CM | POA: Diagnosis not present

## 2015-11-03 DIAGNOSIS — N189 Chronic kidney disease, unspecified: Secondary | ICD-10-CM

## 2015-11-03 DIAGNOSIS — D638 Anemia in other chronic diseases classified elsewhere: Secondary | ICD-10-CM

## 2015-11-03 DIAGNOSIS — D469 Myelodysplastic syndrome, unspecified: Secondary | ICD-10-CM

## 2015-11-03 LAB — CBC & DIFF AND RETIC
BASO%: 0.5 % (ref 0.0–2.0)
Basophils Absolute: 0 10*3/uL (ref 0.0–0.1)
EOS ABS: 0 10*3/uL (ref 0.0–0.5)
EOS%: 0.6 % (ref 0.0–7.0)
HCT: 33.5 % — ABNORMAL LOW (ref 34.8–46.6)
HEMOGLOBIN: 10.2 g/dL — AB (ref 11.6–15.9)
IMMATURE RETIC FRACT: 4.4 % (ref 1.60–10.00)
LYMPH%: 13.2 % — AB (ref 14.0–49.7)
MCH: 29.8 pg (ref 25.1–34.0)
MCHC: 30.4 g/dL — ABNORMAL LOW (ref 31.5–36.0)
MCV: 98 fL (ref 79.5–101.0)
MONO#: 0.5 10*3/uL (ref 0.1–0.9)
MONO%: 8.2 % (ref 0.0–14.0)
NEUT#: 4.9 10*3/uL (ref 1.5–6.5)
NEUT%: 77.5 % — AB (ref 38.4–76.8)
Platelets: 125 10*3/uL — ABNORMAL LOW (ref 145–400)
RBC: 3.42 10*6/uL — ABNORMAL LOW (ref 3.70–5.45)
RDW: 15.4 % — ABNORMAL HIGH (ref 11.2–14.5)
RETIC CT ABS: 45.14 10*3/uL (ref 33.70–90.70)
Retic %: 1.32 % (ref 0.70–2.10)
WBC: 6.4 10*3/uL (ref 3.9–10.3)
lymph#: 0.8 10*3/uL — ABNORMAL LOW (ref 0.9–3.3)

## 2015-11-03 LAB — COMPREHENSIVE METABOLIC PANEL
ALBUMIN: 3.3 g/dL — AB (ref 3.5–5.0)
ALK PHOS: 71 U/L (ref 40–150)
ALT: 13 U/L (ref 0–55)
AST: 20 U/L (ref 5–34)
Anion Gap: 9 mEq/L (ref 3–11)
BILIRUBIN TOTAL: 0.37 mg/dL (ref 0.20–1.20)
BUN: 39.7 mg/dL — AB (ref 7.0–26.0)
CO2: 28 meq/L (ref 22–29)
CREATININE: 2.3 mg/dL — AB (ref 0.6–1.1)
Calcium: 10.2 mg/dL (ref 8.4–10.4)
Chloride: 106 mEq/L (ref 98–109)
EGFR: 25 mL/min/{1.73_m2} — AB (ref >90)
GLUCOSE: 114 mg/dL (ref 70–140)
Potassium: 3.7 mEq/L (ref 3.5–5.1)
SODIUM: 143 meq/L (ref 136–145)
TOTAL PROTEIN: 7.7 g/dL (ref 6.4–8.3)

## 2015-11-03 LAB — FERRITIN: Ferritin: 99 ng/ml (ref 9–269)

## 2015-11-03 MED ORDER — DARBEPOETIN ALFA 100 MCG/0.5ML IJ SOSY: 40.0000 ug | PREFILLED_SYRINGE | Freq: Once | INTRAMUSCULAR | Status: DC

## 2015-11-03 MED ORDER — SODIUM CHLORIDE 0.9 % IV SOLN
510.0000 mg | Freq: Once | INTRAVENOUS | Status: AC
  Administered 2015-11-03: 510 mg via INTRAVENOUS
  Filled 2015-11-03: qty 17

## 2015-11-03 NOTE — Progress Notes (Signed)
[name]    HEMATOLOGY/ONCOLOGY CLINIC NOTE  Date of Service: .11/03/2015   Patient Care Team: Lona Kettle, MD as PCP - General (Family Medicine)  CHIEF COMPLAINTS/PURPOSE OF CONSULTATION: f/u for Anemia  Diagnosis: Normocytic Normochromic Anemia likely multifactorial - CKD/RA/GIB/MDS  Treatment: Aranesp + IV feraheme as needed  HISTORY OF PRESENTING ILLNESS: Please see my initial consultation for details on initial presentation.  INTERVAL HISTORY  [name] is here for her scheduled follow-up for her anemia. Her hemoglobin is stable at around 10. She notes that she was placed back on Eliquis as anticoagulation for her atrial fibrillation. No overt GI bleeding at this time. Ferritin is 99.    MEDICAL HISTORY:  Past Medical History  Diagnosis Date  . Hypertension   . Pulmonary HTN ([place])   . Aortic aneurysm ([place])   . Sleep apnea     wears CPAP  . CKD (chronic kidney disease) stage 3, GFR 30-59 ml/min   . Anemia 03/23/15    transfusion  . Arthritis   . CHF (congestive heart failure) ([place])   . Gout   . Rheumatoid arthritis(714.0) 11/06/2012    SURGICAL HISTORY: Past Surgical History  Procedure Laterality Date  . Abdominal aortic aneurysm repair    . Right heart catheterization N/A 08/18/2014    Procedure: RIGHT HEART CATH;  Surgeon: Jolaine Artist, MD;  Location: [facility] CATH LAB;  Service: Cardiovascular;  Laterality: N/A;  . Tee without cardioversion N/A 01/05/2015    Procedure: TRANSESOPHAGEAL ECHOCARDIOGRAM (TEE);  Surgeon: Larey Dresser, MD;  Location: [facility];  Service: Cardiovascular;  Laterality: N/A;  . Wisdom tooth extraction    . Givens capsule study N/A 08/21/2015    Procedure: GIVENS CAPSULE STUDY;  Surgeon: Gatha Mayer, MD;  Location: [facility];  Service: Endoscopy;  Laterality: N/A;  . Esophagogastroduodenoscopy N/A 08/19/2015    Procedure: ESOPHAGOGASTRODUODENOSCOPY (EGD);  Surgeon: Jerene Bears, MD;  Location: [facility] ENDOSCOPY;  Service: Endoscopy;   Laterality: N/A;  patient scheduled, anesthesia aware of 1500 case, per [place].  08/18/15 DP  . Colonoscopy N/A 08/19/2015    Procedure: COLONOSCOPY;  Surgeon: Jerene Bears, MD;  Location: [facility] ENDOSCOPY;  Service: Endoscopy;  Laterality: N/A;  . Cardiac catheterization N/A 10/24/2015    Procedure: Right Heart Cath;  Surgeon: Larey Dresser, MD;  Location: [facility] CV LAB;  Service: Cardiovascular;  Laterality: N/A;    SOCIAL HISTORY: Social History   Social History  . Marital Status: Legally Separated    Spouse Name: N/A  . Number of Children: 1  . Years of Education: N/A   Occupational History  . Disability     Office Work   Social History Main Topics  . Smoking status: Former Smoker -- 1.50 packs/day for 14 years    Types: Cigarettes    Quit date: 08/09/2003  . Smokeless tobacco: Never Used  . Alcohol Use: No  . Drug Use: No  . Sexual Activity: Not Currently   Other Topics Concern  . Not on file   Social History Narrative    FAMILY HISTORY: Family History  Problem Relation Age of Onset  . Heart attack Mother   . Prostate cancer Father   . Diabetes Brother   . Kidney failure Brother     ALLERGIES:  has No Known Allergies.  MEDICATIONS:  Current Outpatient Prescriptions  Medication Sig Dispense Refill  . acetaminophen (TYLENOL) 325 MG tablet Take 2 tablets (650 mg total) by mouth every 6 (six) hours as needed for  mild pain (or Fever >/= 101).    [name] allopurinol (ZYLOPRIM) 300 MG tablet Take 300 mg by mouth daily.    [name] ALPRAZolam (XANAX) 0.25 MG tablet Take 0.25 mg by mouth 2 (two) times daily as needed for sleep.     [name] apixaban (ELIQUIS) 5 MG TABS tablet Take 1 tablet (5 mg total) by mouth 2 (two) times daily. 180 tablet 3  . atorvastatin (LIPITOR) 10 MG tablet Take 1 tablet (10 mg total) by mouth daily. 90 tablet 3  . b complex vitamins tablet Take 1 tablet by mouth daily.    [name] FLUOCINOLONE ACETONIDE BODY 0.01 % OIL Apply 1 mL topically as needed (for flares).    3  . hydroxychloroquine (PLAQUENIL) 200 MG tablet Take 200 mg by mouth 2 (two) times daily.    [name] ketoconazole (NIZORAL) 2 % shampoo Apply 1 application topically as needed for irritation. Leave on 5 minutes before rinsing out  3  . lidocaine (LIDODERM) 5 % Place 1 patch onto the skin daily as needed (for pain).     . metoprolol succinate (TOPROL-XL) 25 MG 24 hr tablet Take 1 tablet (25 mg total) by mouth 2 (two) times daily. 180 tablet 1  . Omega-3 Fatty Acids (FISH OIL) 1200 MG CAPS Take 1,200 mg by mouth daily.    . OXYGEN Inhale 3-6 L into the lungs continuous.     . potassium chloride SA (KLOR-CON M20) 20 MEQ tablet Take 1 tablet (20 mEq total) by mouth daily. 30 tablet 6  . torsemide (DEMADEX) 20 MG tablet Take 4 tablets (80 mg total) by mouth daily. (Patient taking differently: Take 40 mg by mouth 2 (two) times daily. ) 120 tablet 3  . umeclidinium-vilanterol (ANORO ELLIPTA) 62.5-25 MCG/INH AEPB Inhale 1 puff into the lungs daily.     No current facility-administered medications for this visit.   Facility-Administered Medications Ordered in Other Visits  Medication Dose Route Frequency Provider Last Rate Last Dose  . ferumoxytol (FERAHEME) 510 mg in sodium chloride 0.9 % 100 mL IVPB  510 mg Intravenous Once Brunetta Genera, MD   510 mg at 11/03/15 0945    REVIEW OF SYSTEMS:    10 Point review of Systems was done is negative except as noted above.  PHYSICAL EXAMINATION: ECOG PERFORMANCE STATUS: 3 - Symptomatic, >50% confined to bed  . Filed Vitals:   11/03/15 0834  Height: 5\' 4"  (1.626 m)  Weight: 217 lb 4.8 oz (98.567 kg)   Filed Weights   11/03/15 0834  Weight: 217 lb 4.8 oz (98.567 kg)   .Body mass index is 37.28 kg/(m^2).  GENERAL:alert, in no acute distress and comfortable on [place] oxygen SKIN: skin color, texture, turgor are normal, no rashes or significant lesions EYES: normal, conjunctiva are pink and non-injected, sclera clear OROPHARYNX:no exudate, no erythema  and lips, buccal mucosa, and tongue normal  NECK: supple, no JVD, thyroid normal size, non-tender, without nodularity LYMPH:  no palpable lymphadenopathy in the cervical, axillary or inguinal LUNGS: clear to auscultation with normal respiratory effort HEART: regular rate & rhythm,  no murmurs and no lower extremity edema ABDOMEN: abdomen soft, non-tender, normoactive bowel sounds  Musculoskeletal: no cyanosis of digits and no clubbing  PSYCH: alert & oriented x 3 with fluent speech NEURO: no focal motor/sensory deficits  LABORATORY DATA:  I have reviewed the data as listed  . CBC Latest Ref Rng 11/03/2015 10/21/2015 10/06/2015  WBC 3.9 - 10.3 10e3/uL 6.4 7.0 7.0  Hemoglobin 11.6 -  15.9 g/dL 10.2(L) 10.7(L) 10.7(L)  Hematocrit 34.8 - 46.6 % 33.5(L) 36.1 35.4  Platelets 145 - 400 10e3/uL 125(L) 120(L) 124(L)     . CMP Latest Ref Rng 11/03/2015 10/21/2015 10/06/2015  Glucose 70 - 140 mg/dl 114 125(H) 132  BUN 7.0 - 26.0 mg/dL 39.7(H) 28(H) 46.1(H)  Creatinine 0.6 - 1.1 mg/dL 2.3(H) 2.08(H) 2.5(H)  Sodium 136 - 145 mEq/L 143 142 141  Potassium 3.5 - 5.1 mEq/L 3.7 3.6 3.8  Chloride 101 - 111 mmol/L - 103 -  CO2 22 - 29 mEq/L 28 28 29   Calcium 8.4 - 10.4 mg/dL 10.2 10.2 10.5(H)  Total Protein 6.4 - 8.3 g/dL 7.7 - 7.5  Total Bilirubin 0.20 - 1.20 mg/dL 0.37 - 0.35  Alkaline Phos 40 - 150 U/L 71 - 70  AST 5 - 34 U/L 20 - 18  ALT 0 - 55 U/L 13 - 10   .  Lab Results  Component Value Date   FERRITIN 99 11/03/2015    RADIOGRAPHIC STUDIES: I have personally reviewed the radiological images as listed and agreed with the findings in the report. Mm Screening Breast Tomo Bilateral  10/17/2015  CLINICAL DATA:  Screening. EXAM: DIGITAL SCREENING BILATERAL MAMMOGRAM WITH 3D TOMO WITH CAD COMPARISON:  Previous exam(s). ACR Breast Density Category b: There are scattered areas of fibroglandular density. FINDINGS: There are no findings suspicious for malignancy. Images were processed with CAD.  IMPRESSION: No mammographic evidence of malignancy. A result letter of this screening mammogram will be mailed directly to the patient. RECOMMENDATION: Screening mammogram in one year. (Code:SM-B-01Y) BI-RADS CATEGORY  1: Negative. Electronically Signed   By: Pamelia Hoit M.D.   On: 10/17/2015 18:36    ASSESSMENT & PLAN:   70 year old female with multiple medical comorbidities including hypertension, COPD, CHF, gout, rheumatoid arthritis, moderate mitral regurgitation with  #1 Normocytic Normochromic anemia.-Her anemia is likely multifactorial from her chronic kidney disease plus rheumatoid arthritis. Her allopurinol and plaquenil could certainly be an additional factors especially if there is worsening renal function. 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 SPEP with no monoclonal protein. No clinical evidence of splenomegaly to suggest overt hypersplenism.  Recently developed Acute on Chronic Anemia due to GI bleeding requiring 5 units of PRBCs and IV feraheme in the hospital. #2 Mild thrombocytopenia #2 rheumatoid arthritis #3 chronic kidney disease #4 pulmonary hypertension #5 CHF  Plan -hgb today is stable at 10.2 -continue on low-dose Aranesp 40 g every 4 weeks  to treat anemia of chronic disease due to CKD and rheumatoid arthritis as well as likely element of MDS. will increase dose as needed to target hemoglobin around 10-11 and hold for hemoglobin more than 11.  -Due for her next dose of Aranesp today -We will give her 1 dose of IV feraheme today and can cancel her appointment for next week. -Continue optimal treatment of rheumatoid arthritis -Continue follow-up with primary care physician for other medical cares. -was recently started on Eliquis for her a fib by her cardiologist. Burnis Medin need to monitor closely for recurrent GI bleeding.  Return to care with Dr. Irene Limbo in 1 month with CBC, CMP and ferritin  All of the patients questions  were answered to her apparent satisfaction. The patient knows to call the clinic with any problems, questions or concerns.  I spent 15 minutes counseling the patient face to face. The total time spent in the appointment was 15 minutes and more than 50% was on counseling and direct patient  cares and reviewed her extensive inpatient records and results    Sullivan Lone MD [place] AAHIVMS [facility] Hematology/Oncology Physician [facility]  (Office):       [phone] (Work cell):  [phone] (Fax):           [phone]

## 2015-11-03 NOTE — Progress Notes (Signed)
Aranesp held per Deedra Ehrich, PharmD. Due to insurance constraints, pt may not receive Feraheme and Aranesp in the same day. Will review with MD for injection re-scheduling.

## 2015-11-03 NOTE — Telephone Encounter (Signed)
Gave and printed appt sched and avs for pt for April and May °

## 2015-11-03 NOTE — Patient Instructions (Signed)
Darbepoetin Alfa injection What is this medicine? DARBEPOETIN ALFA (dar be POE e tin AL fa) helps your body make more red blood cells. It is used to treat anemia caused by chronic kidney failure and chemotherapy. This medicine may be used for other purposes; ask your health care provider or pharmacist if you have questions. What should I tell my health care provider before I take this medicine? They need to know if you have any of these conditions: -blood clotting disorders or history of blood clots -cancer patient not on chemotherapy -cystic fibrosis -heart disease, such as angina, heart failure, or a history of a heart attack -hemoglobin level of 12 g/dL or greater -high blood pressure -low levels of folate, iron, or vitamin B12 -seizures -an unusual or allergic reaction to darbepoetin, erythropoietin, albumin, hamster proteins, latex, other medicines, foods, dyes, or preservatives -pregnant or trying to get pregnant -breast-feeding How should I use this medicine? This medicine is for injection into a vein or under the skin. It is usually given by a health care professional in a hospital or clinic setting. If you get this medicine at home, you will be taught how to prepare and give this medicine. Do not shake the solution before you withdraw a dose. Use exactly as directed. Take your medicine at regular intervals. Do not take your medicine more often than directed. It is important that you put your used needles and syringes in a special sharps container. Do not put them in a trash can. If you do not have a sharps container, call your pharmacist or healthcare provider to get one. Talk to your pediatrician regarding the use of this medicine in children. While this medicine may be used in children as young as 1 year for selected conditions, precautions do apply. Overdosage: If you think you have taken too much of this medicine contact a poison control center or emergency room at once. NOTE:  This medicine is only for you. Do not share this medicine with others. What if I miss a dose? If you miss a dose, take it as soon as you can. If it is almost time for your next dose, take only that dose. Do not take double or extra doses. What may interact with this medicine? Do not take this medicine with any of the following medications: -epoetin alfa This list may not describe all possible interactions. Give your health care provider a list of all the medicines, herbs, non-prescription drugs, or dietary supplements you use. Also tell them if you smoke, drink alcohol, or use illegal drugs. Some items may interact with your medicine. What should I watch for while using this medicine? Visit your prescriber or health care professional for regular checks on your progress and for the needed blood tests and blood pressure measurements. It is especially important for the doctor to make sure your hemoglobin level is in the desired range, to limit the risk of potential side effects and to give you the best benefit. Keep all appointments for any recommended tests. Check your blood pressure as directed. Ask your doctor what your blood pressure should be and when you should contact him or her. As your body makes more red blood cells, you may need to take iron, folic acid, or vitamin B supplements. Ask your doctor or health care provider which products are right for you. If you have kidney disease continue dietary restrictions, even though this medication can make you feel better. Talk with your doctor or health care professional about the   foods you eat and the vitamins that you take. °What side effects may I notice from receiving this medicine? °Side effects that you should report to your doctor or health care professional as soon as possible: °-allergic reactions like skin rash, itching or hives, swelling of the face, lips, or tongue °-breathing problems °-changes in vision °-chest pain °-confusion, trouble speaking  or understanding °-feeling faint or lightheaded, falls °-high blood pressure °-muscle aches or pains °-pain, swelling, warmth in the leg °-rapid weight gain °-severe headaches °-sudden numbness or weakness of the face, arm or leg °-trouble walking, dizziness, loss of balance or coordination °-seizures (convulsions) °-swelling of the ankles, feet, hands °-unusually weak or tired °Side effects that usually do not require medical attention (report to your doctor or health care professional if they continue or are bothersome): °-diarrhea °-fever, chills (flu-like symptoms) °-headaches °-nausea, vomiting °-redness, stinging, or swelling at site where injected °This list may not describe all possible side effects. Call your doctor for medical advice about side effects. You may report side effects to FDA at 1-800-FDA-1088. °Where should I keep my medicine? °Keep out of the reach of children. °Store in a refrigerator between 2 and 8 degrees C (36 and 46 degrees F). Do not freeze. Do not shake. Throw away any unused portion if using a single-dose vial. Throw away any unused medicine after the expiration date. °NOTE: This sheet is a summary. It may not cover all possible information. If you have questions about this medicine, talk to your doctor, pharmacist, or health care provider. °  °© 2016, [employer] Standard. (2008-07-06 10:23:57) °Ferumoxytol injection °What is this medicine? °FERUMOXYTOL is an iron complex. Iron is used to make healthy red blood cells, which carry oxygen and nutrients throughout the body. This medicine is used to treat iron deficiency anemia in people with chronic kidney disease. °This medicine may be used for other purposes; ask your health care provider or pharmacist if you have questions. °What should I tell my health care provider before I take this medicine? °They need to know if you have any of these conditions: °-anemia not caused by low iron levels °-high levels of iron in the  blood °-magnetic resonance imaging (MRI) test scheduled °-an unusual or allergic reaction to iron, other medicines, foods, dyes, or preservatives °-pregnant or trying to get pregnant °-breast-feeding °How should I use this medicine? °This medicine is for injection into a vein. It is given by a health care professional in a hospital or clinic setting. °Talk to your pediatrician regarding the use of this medicine in children. Special care may be needed. °Overdosage: If you think you have taken too much of this medicine contact a poison control center or emergency room at once. °NOTE: This medicine is only for you. Do not share this medicine with others. °What if I miss a dose? °It is important not to miss your dose. Call your doctor or health care professional if you are unable to keep an appointment. °What may interact with this medicine? °This medicine may interact with the following medications: °-other iron products °This list may not describe all possible interactions. Give your health care provider a list of all the medicines, herbs, non-prescription drugs, or dietary supplements you use. Also tell them if you smoke, drink alcohol, or use illegal drugs. Some items may interact with your medicine. °What should I watch for while using this medicine? °Visit your doctor or healthcare professional regularly. Tell your doctor or healthcare professional if your symptoms do not   start to get better or if they get worse. You may need blood work done while you are taking this medicine. °You may need to follow a special diet. Talk to your doctor. Foods that contain iron include: whole grains/cereals, dried fruits, beans, or peas, leafy green vegetables, and organ meats (liver, kidney). °What side effects may I notice from receiving this medicine? °Side effects that you should report to your doctor or health care professional as soon as possible: °-allergic reactions like skin rash, itching or hives, swelling of the face,  lips, or tongue °-breathing problems °-changes in blood pressure °-feeling faint or lightheaded, falls °-fever or chills °-flushing, sweating, or hot feelings °-swelling of the ankles or feet °Side effects that usually do not require medical attention (Report these to your doctor or health care professional if they continue or are bothersome.): °-diarrhea °-headache °-nausea, vomiting °-stomach pain °This list may not describe all possible side effects. Call your doctor for medical advice about side effects. You may report side effects to FDA at 1-800-FDA-1088. °Where should I keep my medicine? °This drug is given in a hospital or clinic and will not be stored at home. °NOTE: This sheet is a summary. It may not cover all possible information. If you have questions about this medicine, talk to your doctor, pharmacist, or health care provider. °  °© 2016, [employer]vier/Gold Standard. (2012-03-07 15:23:36) ° °

## 2015-11-04 ENCOUNTER — Other Ambulatory Visit: Payer: Self-pay | Admitting: *Deleted

## 2015-11-04 ENCOUNTER — Telehealth: Payer: Self-pay | Admitting: Hematology

## 2015-11-04 NOTE — Telephone Encounter (Signed)
Spoke with patient to confrim inj appt for 4/6 per 3/31 pof

## 2015-11-10 ENCOUNTER — Ambulatory Visit: Payer: Medicare Other

## 2015-11-10 ENCOUNTER — Other Ambulatory Visit: Payer: Self-pay | Admitting: Hematology

## 2015-11-10 ENCOUNTER — Other Ambulatory Visit: Payer: Self-pay | Admitting: *Deleted

## 2015-11-21 ENCOUNTER — Encounter: Payer: Self-pay | Admitting: Emergency Medicine

## 2015-11-21 ENCOUNTER — Ambulatory Visit (INDEPENDENT_AMBULATORY_CARE_PROVIDER_SITE_OTHER): Payer: Medicare Other | Admitting: Emergency Medicine

## 2015-11-21 VITALS — BP 102/66 | HR 72 | Ht 64.0 in | Wt 215.0 lb

## 2015-11-21 DIAGNOSIS — G4733 Obstructive sleep apnea (adult) (pediatric): Secondary | ICD-10-CM | POA: Diagnosis not present

## 2015-11-21 DIAGNOSIS — J449 Chronic obstructive pulmonary disease, unspecified: Secondary | ICD-10-CM

## 2015-11-21 DIAGNOSIS — Z9989 Dependence on other enabling machines and devices: Secondary | ICD-10-CM

## 2015-11-21 DIAGNOSIS — IMO0002 Reserved for concepts with insufficient information to code with codable children: Secondary | ICD-10-CM

## 2015-11-21 DIAGNOSIS — J9611 Chronic respiratory failure with hypoxia: Secondary | ICD-10-CM | POA: Diagnosis not present

## 2015-11-21 DIAGNOSIS — I272 Other secondary pulmonary hypertension: Secondary | ICD-10-CM | POA: Diagnosis not present

## 2015-11-21 NOTE — Assessment & Plan Note (Signed)
She does have documented airflow limitation consistent with COPD but she has never significantly responded to bronchodilators. He maybe gotten better since last visit we started the Anoro but noted that she was also changed to torsemide at that time and this could have helped her as well.. Would like to continue the Anoro but cost is a problem. She has a high deductible on her medication coverage. We will try to help her with coupons, samples etc.

## 2015-11-21 NOTE — Assessment & Plan Note (Signed)
Continue supplement oxygen as she is using it.

## 2015-11-21 NOTE — Patient Instructions (Signed)
Please continue your oxygen and your CPAP as you are using them  We will try to stay on Anoro once a day. We will give you samples to supplement due to the high cost.  Continue your diuretics as ordered by Dr Aundra Dubin Follow with Dr Lamonte Sakai in 4 months or sooner if you have any problems.

## 2015-11-21 NOTE — Assessment & Plan Note (Signed)
Good compliance with CPAP. She just got nasal pillows and feels that this is going to be a good option for her. We will continue to follow on current settings.

## 2015-11-21 NOTE — Assessment & Plan Note (Signed)
Recent right heart cath shows improvement in her Pulm capillary wedge to 17 and improvement in her pulmonary pressures in kind. We will continue to try to impact all of her contributing factors including diastolic CHF, obstructive lung disease, OSA, A. Fib. 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

## 2015-11-21 NOTE — Progress Notes (Signed)
Subjective:    Patient ID: April Mueller, female    DOB: August 20, 1945, 70 y.o.   MRN: [id number]  HPI Initial Visit:  70 yo, former smoker (28 pk-yrs), hx of HTN, OSA on CPAP, Rheumatoid arthritis on plaquenil. Hx AAA 2005 c/b ARF and 3 months of HD (now off). 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 She moved back to [place] from [place] in 8/13, referred by Dr Melinda Crutch for [facility] that has been managed on bosentan 171m bid + sildenifil 219mtid, supplemental O2 4L/min rest, up to 5L/min exertion.   Follow up:  ROV 11/12/14 -- follow-up visit for severe secondary primary hypertension due to COPD, rheumatoid arthritis, and obstructive sleep apnea, most recently on macetentan now for 6 months. She underwent right heart catheterization in January '16.  Showed moderate secondary PAH, with normal PVR, . Suspect she does . She is on 6L/min at rest, adjusts for exercise at pulm rehab. She is scared to stop the macetentan. Her lasix was increased slightly.   ROV 09/16/15 -- follow-up visit for secondary pulmonary hypertension in the setting of diastolic CHF, possible mitral valve disease and mitral regurgitation, rheumatoid arthritis, and COPD. She has been evaluated by Dr McAundra Dubint cardiology - see his very detailed and thorough note.  She has been under evaluation for possible valve procedure, not scheduled.  Also considering watchman placement. She has been off macetentan and does not miss it. I have reevaluated her pulmonary function testing from 01/12/15 and also from 07/03/13. She has mixed obstruction and restriction on spirometry with an FEV1 of less than a liter. She has been on BD's but has never felt that any of these have helped her.  She is compliant with her CPAP - wears it every night.   I discussed with her that general anesthesia is high risk but that I do not think it absolutely contraindicated.   ROV 11/21/15 -- patient with a history of secondary pulmonary hypertension in this setting of  diastolic CHF, arthritis, COPD and obstructive sleep apnea. She also has mitral valvular disease > a repeat right heart cath was performed 10/24/15 after she had been diuresed. Her poor capillary which had improved from 23-17, her RV pressures improved to 49/12 (compared with 63/21 in jan 2016).She has been on opsumit and pulmonary vasodilators before, not currently. She probably feels better off of these medications. She is compliant with her O2 and her CPAP. She has documented significant obstructive lung disease but has never responded bronchodilators. At our last visit I tried her again on Anoro. She states that she did not really notice any changes in her breathing on the Anoro - it was difficult to tell because she also switched to torsemide at the same time. The cost of Anoro will be $200 a month until she reaches her meds deductible, then it will be $30 a month thereafter. She ran out last Thursday.    Review of Systems  Constitutional: Negative for fever and unexpected weight change.  HENT: Negative for congestion, dental problem, ear pain, nosebleeds, postnasal drip, rhinorrhea, sinus pressure, sneezing, sore throat and trouble swallowing.   Eyes: Negative for redness and itching.  Respiratory: Positive for shortness of breath. Negative for cough, chest tightness and wheezing.   Cardiovascular: Positive for leg swelling. Negative for palpitations.  Gastrointestinal: Negative for nausea, vomiting and diarrhea.  Genitourinary: Negative for dysuria.  Musculoskeletal: Negative for joint swelling.  Skin: Negative for rash.  Neurological: Negative for headaches.  Hematological: Does not bruise/bleed easily.  Psychiatric/Behavioral: Negative for dysphoric mood. The patient is not nervous/anxious.     Hepatic Function Latest Ref Rng 11/03/2015 10/06/2015 09/08/2015  Total Protein 6.4 - 8.3 g/dL 7.7 7.5 6.6  Albumin 3.5 - 5.0 g/dL 3.3(L) 3.2(L) -  AST 5 - 34 U/L 20 18 -  ALT 0 - 55 U/L 13 10 -  Alk  Phosphatase 40 - 150 U/L 71 70 -  Total Bilirubin 0.20 - 1.20 mg/dL 0.37 0.35 -    08/18/14 --  Findings:  RA = 14 RV = 62/3/15 PA = 63/21 (38) PCW = mean 23 v waves to 40 Fick cardiac output/index = 6.3/3.2 Thermodilution CO/CI = 6.1/3.1 PVR = 2.0 WU FA sat = 91% PA sat = 57%, 62%  Assessment: 1. Mild to moderate pulmonary HTN with normal PVR which suggests pulmonary venous HTN 2. Moderately elevated left-sided pressures in the setting of her not taking lasix this am 3. Normal cardiac output      Objective:   Physical Exam Filed Vitals:   11/21/15 0923  BP: 102/66  Pulse: 72  Height: _0  (1.626 m)  Weight: 215 lb (97.523 kg)  SpO2: 93%   Gen: Pleasant, well-nourished, in no distress,  normal affect  ENT: No lesions,  mouth clear,  oropharynx clear, no postnasal drip  Neck: No JVD, no TMG, no carotid bruits  Lungs: No use of accessory muscles, distant, clear without rales or rhonchi  Cardiovascular: RRR, heart sounds normal, no murmur or gallops, no peripheral edema  Musculoskeletal: No deformities, no cyanosis or clubbing, trace to 1+ ankle edema  Neuro: alert, non focal  Skin: Warm, no lesions or rashes   Study Conclusions 12/17/12 --  - Left ventricle: The cavity size was normal. Wall thickness was normal. Systolic function was normal. The estimated ejection fraction was in the range of 55% to 60%. Wall motion was normal; there were no regional wall motion abnormalities. - Mitral valve: MR is eccentric and directed posteriorl somewhat hard to judge extent Mildly calcified annulus. Mildly thickened leaflets . Mild to moderate regurgitation. - Left atrium: The atrium was mildly dilated. - Right ventricle: The cavity size was mildly dilated. - Right atrium: The atrium was mildly dilated. - Atrial septum: The septum bowed from left to right, consistent with increased left atrial pressure. - Pulmonary arteries: PA peak pressure: 20m Hg (S).       Assessment & Plan:  Secondary pulmonary hypertension ([place] Recent right heart cath shows improvement in her Pulm capillary wedge to 17 and improvement in her pulmonary pressures in kind. We will continue to try to impact all of her contributing factors including diastolic CHF, obstructive lung disease, OSA, A. Fib. 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  COPD She does have documented airflow limitation consistent with COPD but she has never significantly responded to bronchodilators. He maybe gotten better since last visit we started the Anoro but noted that she was also changed to torsemide at that time and this could have helped her as well.. Would like to continue the Anoro but cost is a problem. She has a high deductible on her medication coverage. We will try to help her with coupons, samples etc.  OSA on CPAP Good compliance with CPAP. She just got  nasal pillows and feels that this is going to be a good option for her. We will continue to follow on current settings.  Chronic respiratory failure with hypoxia (HCC) Continue supplement oxygen as she is using it.

## 2015-11-22 ENCOUNTER — Ambulatory Visit (HOSPITAL_COMMUNITY)
Admission: RE | Admit: 2015-11-22 | Discharge: 2015-11-22 | Disposition: A | Discharge status: Home / Self Care | Payer: Medicare Other | Source: Ambulatory Visit | Attending: Cardiology | Admitting: Cardiology

## 2015-11-22 VITALS — BP 120/68 | HR 70 | Resp 20 | Wt 214.5 lb

## 2015-11-22 DIAGNOSIS — N183 Chronic kidney disease, stage 3 unspecified: Secondary | ICD-10-CM

## 2015-11-22 DIAGNOSIS — I340 Nonrheumatic mitral (valve) insufficiency: Secondary | ICD-10-CM | POA: Diagnosis not present

## 2015-11-22 DIAGNOSIS — Z8249 Family history of ischemic heart disease and other diseases of the circulatory system: Secondary | ICD-10-CM | POA: Insufficient documentation

## 2015-11-22 DIAGNOSIS — Z79899 Other long term (current) drug therapy: Secondary | ICD-10-CM | POA: Diagnosis not present

## 2015-11-22 DIAGNOSIS — J9610 Chronic respiratory failure, unspecified whether with hypoxia or hypercapnia: Secondary | ICD-10-CM | POA: Diagnosis not present

## 2015-11-22 DIAGNOSIS — I5032 Chronic diastolic (congestive) heart failure: Secondary | ICD-10-CM | POA: Insufficient documentation

## 2015-11-22 DIAGNOSIS — Z87891 Personal history of nicotine dependence: Secondary | ICD-10-CM | POA: Diagnosis not present

## 2015-11-22 DIAGNOSIS — E669 Obesity, unspecified: Secondary | ICD-10-CM | POA: Insufficient documentation

## 2015-11-22 DIAGNOSIS — D649 Anemia, unspecified: Secondary | ICD-10-CM | POA: Diagnosis not present

## 2015-11-22 DIAGNOSIS — G4733 Obstructive sleep apnea (adult) (pediatric): Secondary | ICD-10-CM | POA: Insufficient documentation

## 2015-11-22 DIAGNOSIS — IMO0002 Reserved for concepts with insufficient information to code with codable children: Secondary | ICD-10-CM

## 2015-11-22 DIAGNOSIS — I272 Other secondary pulmonary hypertension: Secondary | ICD-10-CM | POA: Diagnosis not present

## 2015-11-22 DIAGNOSIS — Z841 Family history of disorders of kidney and ureter: Secondary | ICD-10-CM | POA: Diagnosis not present

## 2015-11-22 DIAGNOSIS — Z833 Family history of diabetes mellitus: Secondary | ICD-10-CM | POA: Insufficient documentation

## 2015-11-22 DIAGNOSIS — I4819 Other persistent atrial fibrillation: Secondary | ICD-10-CM

## 2015-11-22 DIAGNOSIS — Z7901 Long term (current) use of anticoagulants: Secondary | ICD-10-CM | POA: Diagnosis not present

## 2015-11-22 DIAGNOSIS — I481 Persistent atrial fibrillation: Secondary | ICD-10-CM | POA: Insufficient documentation

## 2015-11-22 DIAGNOSIS — J449 Chronic obstructive pulmonary disease, unspecified: Secondary | ICD-10-CM | POA: Insufficient documentation

## 2015-11-22 DIAGNOSIS — M069 Rheumatoid arthritis, unspecified: Secondary | ICD-10-CM | POA: Diagnosis not present

## 2015-11-22 LAB — BASIC METABOLIC PANEL
ANION GAP: 10 (ref 5–15)
BUN: 34 mg/dL — ABNORMAL HIGH (ref 6–20)
CALCIUM: 10.4 mg/dL — AB (ref 8.9–10.3)
CO2: 27 mmol/L (ref 22–32)
Chloride: 104 mmol/L (ref 101–111)
Creatinine, Ser: 2.13 mg/dL — ABNORMAL HIGH (ref 0.44–1.00)
GFR, EST AFRICAN AMERICAN: 26 mL/min — AB (ref >60)
GFR, EST NON AFRICAN AMERICAN: 23 mL/min — AB (ref >60)
GLUCOSE: 121 mg/dL — AB (ref 65–99)
POTASSIUM: 3.5 mmol/L (ref 3.5–5.1)
Sodium: 141 mmol/L (ref 135–145)

## 2015-11-22 LAB — BRAIN NATRIURETIC PEPTIDE: B Natriuretic Peptide: 198 pg/mL — ABNORMAL HIGH (ref 0.0–100.0)

## 2015-11-22 LAB — CBC
HEMATOCRIT: 35 % — AB (ref 36.0–46.0)
HEMOGLOBIN: 10.3 g/dL — AB (ref 12.0–15.0)
MCH: 28.9 pg (ref 26.0–34.0)
MCHC: 29.4 g/dL — AB (ref 30.0–36.0)
MCV: 98 fL (ref 78.0–100.0)
Platelets: 109 10*3/uL — ABNORMAL LOW (ref 150–400)
RBC: 3.57 MIL/uL — AB (ref 3.87–5.11)
RDW: 16 % — ABNORMAL HIGH (ref 11.5–15.5)
WBC: 5 10*3/uL (ref 4.0–10.5)

## 2015-11-22 NOTE — Patient Instructions (Signed)
Routine lab work today. Will notify you of abnormal results, otherwise no news is good news!  Follow up with Dr. Aundra Dubin in 2 months.  Do the following things EVERYDAY: 1) Weigh yourself in the morning before breakfast. Write it down and keep it in a log. 2) Take your medicines as prescribed 3) Eat low salt foods-Limit salt (sodium) to 2000 mg per day.  4) Stay as active as you can everyday 5) Limit all fluids for the day to less than 2 liters

## 2015-11-22 NOTE — Progress Notes (Signed)
Patient ID: April Mueller, female   DOB: 04/06/1946, 70 y.o.   MRN: [id number]  HPI  PCP: Dr Harrington Challenger Pulmonary: Dr Byrum/Dr Melvyn Novas   April Mueller is a 70 yo with following PMHx  1) COPD       --former smoker (28 pk-yrs)       --on home O2 - 4L/min rest, up to 5L/min exertion.        --spirometry 6/13 in [place] = 0.85L FVC = 1.3L no DLCO measured. FEV1 in 2014 = 0.73        --CT chest 10/14 - moderate centrilobular emphysema. No pulm fibrosis but there was evidence for post-infectious/inflammatory scarring. Stable Asc Ao repair       --PFTs (11/14) FVC 46%, FEV1 0.8L (40%), ratio 87%, FEF 25-75% 0.44L DLCO 22%, TLC 50% => PFTs in 06/2013 did NOT suggest significant airflow obstruction according to Dr Gustavus Bryant last note despite "moderate emphysema" on CT.        --PFTs (6/16) were abnormal with severe obstruction, severe restriction, severely decreased DLCO.  Reviewed with Dr Melvyn Novas => he thinks obstruction is actually fairly minimal and that the restriction is due to body habitus.   2) Obesity 3) RA       --on plaquenil, sees Dr Lenna Gilford.  4) Type A aortic dissection s/p repair 2005     --c/b renal failure requiring HD x 3 months 5) PAH - previously treated in [place].  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      --on macitentan (revatio stopped 10/14).     --V/Q scan 11/14 no chronic PE     -- 2015 Tracleer stopped and started on macitentan. 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      -- RHC 08/18/2014: No role for pulmonary vasodilators.  RA = 14 RV = 62/3/15 PA = 63/21 (38) PCW = mean 23 v waves to 40 Fick cardiac output/index = 6.3/3.2 Thermodilution CO/CI = 6.1/3.1, PVR = 2.0 WU, FA sat = 91% PA sat = 57%, 62%     -- RHC 3/17: mean RA 13, PA 44/2 mean 29, mean PCWP 17, CI 3.04, PVR 1.9 WU => pulmonary venous hypertension. 6) OSA 7) Diastolic CHF    --ECHO [id number] EF 55-60% RV mildly dilated with normal function. Mild to moderate posterior MR. RVSP 56. Probable restrictive  diastolic filling pattern    --ECHO 9/15 EF 60-65% RV mildly dilated with normal function. Mild to moderate posterior MR. RVSP 40. Ao Root ok    --TEE 6/16 with EF 55-60%, D-shaped interventricular septum suggestive of RV pressure/volume overload, RV mildly dilated with normal systolic function, peak RV-RA gradient 57 mmHg, very eccentric posteriorly-directed mitral regurgitation, possibly severe, etiology may be a very small area of prolapse on the anterior leaflet, s/p ascending aorta repair with residual dissection flap in the descending thoracic aorta.       --Echo (1/17) with EF 55-60%, mild to moderate MR, PASP 66 8) CKD 9) Mitral regurgitation: Possibly severe by 6/16 TEE, very eccentric.  Not good candidate for percutaneous MV clip (seen at [facility]).  Echo (1/17) with only mild to moderate MR.  10) Anemia: Suspect from chronic disease/renal disease but has been found to be FOBT+.  EGD/colonoscopy/capsule endoscopy in 1/17 did not show any definite site for blood loss. 11) Atrial fibrillation: Persistent, 1st noted in 1/17.   Moved back from [place] and saw Dr. Lamonte Sakai. In [place] was followed by cardiology and pulmonary. Started on Tracleer in 2006.  Revatio was added, however was discontinued without any change in her symptoms. Stopped smoking in January 2005 when she had aortic aneurysm repair. Smoked 1.5-2 ppd x 15 - 20 years.  Now on macitentan.  I had her do a TEE in 6/16.  She had a loud MR murmur.  TEE showed very eccentric, posteriorly-directed MR that may be from subtle prolapse of the anterior mitral leaflet.  I am concerned that the MR could be severe but very difficult to fully visualize.  She additionally had PFTs done that were abnormal, but according to her pulmonologist Melvyn Novas), the obstruction was really only minimal and the restriction was primarily related to body habitus.  He does not think that we can explain her hypoxemia and dyspnea primarily from parenchymal lung pathology.  I sent her to  [facility] for evaluation for percutaneous mitral valve clip.  They did not think that she was a good candidate.   She has been anemic and received 1 unit PRBCs in 8/16.  She saw GI and was noted to be heme negative, no scopes were done (heme negative and high risk). She has had iron infusions.   Opsumit was stopped in 9/16 due to suspicion for group 2 pulmonary hypertension only (low PVR) and she felt better off it.   She was admitted in 1/17 with increased dyspnea and was found to be profoundly anemic.  She had a total of 5 units PRBCs and an iron infusion in the hospital.  Stool was FOBT+, melena.  EGD, colonoscopy, and capsule endoscopy were all done without clear source of blood loss.  While in the hospital, she was noted to go into atrial fibrillation with RVR.  She initially required amiodarone for rate control, but was transitioned over to Toprol XL. She was not anticoagulated due to concern for GI blood loss and anemia.    RHC in 3/17 showed elevated right heart filling pressures and mildly elevated PCWP.  There was mild pulmonary hypertension, likely pulmonary venous hypertension with PVR 1.9 WU.   She returns for followup today.  She remains in atrial fibrillation.  Weight is stable. She remains short of breath after walking about 200 feet.  Overall, feels ok. No BRBPR or melena.  No chest pain.  Stable orthopnea.  Using CPAP at night and oxygen during the day. She was seen by Dr Rayann Heman, he recommended against Watchman. She is now on Eliquis with no evidence for GI bleeding so far.   ECG: atrial fibrillation, poor RWP   Labs 02/05/13 K 3.8 Creatinine 1.37 05/08/13 K 3.8, Creatinine 1.58 11/14 K 4.1, creatinine 1.82 08/09/2014: K 4.0 Creatinine 1.45  5/16: K 3.7, creatinine 1.76, BNP 447 6/16: K 5 => 4.6, creatinine 2.09 => 2.13, BNP 291 8/16: K 4.7, creatinine 2.1, HCT 30.7 9/16: K 4.5, creatinine 2.3, HCT 34.1 1/17: K 4.8, creatinine 1.84 => 1.87, HCT 29.7, plts 136, FOBT+, BNP 403 2/17: K  3.5, creatinine 1.89, BNP 329, hgb 10 3/17: K 3.8, creatinine 2.5 => 2.3, HCT 35.4 => 33.5  ROS: All systems reviewed and negative except as per HPI.  Current Outpatient Prescriptions on File Prior to Encounter  Medication Sig Dispense Refill  . acetaminophen (TYLENOL) 325 MG tablet Take 2 tablets (650 mg total) by mouth every 6 (six) hours as needed for mild pain (or Fever >/= 101).    [name] allopurinol (ZYLOPRIM) 300 MG tablet Take 300 mg by mouth daily.    [name] ALPRAZolam (XANAX) 0.25 MG tablet Take 0.25 mg by  mouth 2 (two) times daily as needed for sleep.     [name] apixaban (ELIQUIS) 5 MG TABS tablet Take 1 tablet (5 mg total) by mouth 2 (two) times daily. 180 tablet 3  . atorvastatin (LIPITOR) 10 MG tablet Take 1 tablet (10 mg total) by mouth daily. 90 tablet 3  . b complex vitamins tablet Take 1 tablet by mouth daily.    . Biotin 1 MG CAPS Take 1 capsule by mouth daily.    [name] FLUOCINOLONE ACETONIDE BODY 0.01 % OIL Apply 1 mL topically as needed (for flares).   3  . hydroxychloroquine (PLAQUENIL) 200 MG tablet Take 200 mg by mouth 2 (two) times daily.    [name] ketoconazole (NIZORAL) 2 % shampoo Apply 1 application topically as needed for irritation. Leave on 5 minutes before rinsing out  3  . lidocaine (LIDODERM) 5 % Place 1 patch onto the skin daily as needed (for pain).     . metoprolol succinate (TOPROL-XL) 25 MG 24 hr tablet Take 1 tablet (25 mg total) by mouth 2 (two) times daily. 180 tablet 1  . Omega-3 Fatty Acids (FISH OIL) 1200 MG CAPS Take 1,200 mg by mouth daily.    . OXYGEN Inhale 3-6 L into the lungs continuous.     . potassium chloride SA (KLOR-CON M20) 20 MEQ tablet Take 1 tablet (20 mEq total) by mouth daily. 30 tablet 6  . umeclidinium-vilanterol (ANORO ELLIPTA) 62.5-25 MCG/INH AEPB Inhale 1 puff into the lungs daily.     No current facility-administered medications on file prior to encounter.   Social History   Social History  . Marital Status: Legally Separated    Spouse Name:  N/A  . Number of Children: 1  . Years of Education: N/A   Occupational History  . Disability     Office Work   Social History Main Topics  . Smoking status: Former Smoker -- 1.50 packs/day for 14 years    Types: Cigarettes    Quit date: 08/09/2003  . Smokeless tobacco: Never Used  . Alcohol Use: No  . Drug Use: No  . Sexual Activity: Not Currently   Other Topics Concern  . Not on file   Social History Narrative   Family History  Problem Relation Age of Onset  . Heart attack Mother   . Prostate cancer Father   . Diabetes Brother   . Kidney failure Brother     Filed Vitals:   11/22/15 1009  BP: 120/68  Pulse: 70  Resp: 20  Weight: 214 lb 8 oz (97.297 kg)  SpO2: 94%   Physical Exam Gen: Pleasant, well-nourished, in no distress, normal affect; on O2 by [place].  HEENT: normal Neck: JVP 7-8 cm. Lungs: CTAB  Cardiovascular: well healed sternotomy scar  PMI normal. Regular S1S2, no S3/S4, 3/6 HSM LLSB/apex. 1+ edema 1/2 up lower legs bilaterally. Extremities: No deformities, no cyanosis or clubbing.  Neuro: alert, non focal. Cranial nerves intact. Moves all 4 extremities without difficulty Skin: Warm, no lesions or rashes  Assessment/Plan: 1. 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  This appeared to be primarily pulmonary venous hypertension.  Interestingly, there were very prominent V waves in the PCWP tracing. On exam, she has a prominent MR murmur.  I was concerned that significant MR may be playing a major role in her symptomatology. I did a TEE in 6/16 showing very eccentric, posteriorly-directed mitral  regurgitation possibly from subtle anterior leaflet prolapse.  I think that the MR could be severe but is difficult to fully visualize.  She has significant COPD to help explain her hypoxemia and dyspnea. She was seen at [facility] for consideration of  percutaneous MV clip and was not thought to be a good candidate => concern that MV disease may not actually be severe and concern that her dyspnea is coming from COPD and would not be improved by MV clipping.  Most recent echo in 1/17 showed normal LV EF and only visualized mild to moderate MR.  She became volume overloaded in the setting of the onset of persistent atrial fibrillation.  At a prior appointment, Lasix was stopped and torsemide was started.  Volume status is improved overall.  NYHA class III symptoms, stable.  Recent RHC showed mildly elevated filling pressures and pulmonary venous hypertension (mild).  - Continue torsemide 40 mg bid, check BMET/BNP today.  2. Chronic respiratory failure:  She is on 4-5 L home oxygen by [place]. She has COPD and restriction from her body habitus.  3. OSA: Continue CPAP nightly.  4. CKD: Stage III-IV.  She has been referred to nephrology.  Repeat BMET. 5. COPD: See #2 above, per pulmonary.     6. Aortic dissection s/p repair 2005: Although complete records are not available, it appears that the patient developed a Type A dissection in 2005 with ascending aorta repair (can see the surgically repaired ascending aorta well on TEE).  She has residual dissection in the descending thoracic aorta.  7. Pulmonary hypertension: This is primarily [place] group 2 (pulmonary venous hypertension) based on RHC in 3/17.  She is not a good candidate for pulmonary vasodilators.   8. Mitral regurgitation:  TEE concerning for possibly severe, eccentric mitral regurgitation, most likely due to subtle prolapse of the anterior mitral valve leaflet.  I have been concerned that the MR is playing a role in her CHF. She had prominent V-waves on RHC in 1/16, and she has a loud mitral area murmur.  I do not think that she is a good candidate for open MV surgery. I had her evaluated at [facility] for possible percutaneous mitral clipping.  She was turned down for this as her MR was not clearly severe and  fixing it may not help her hypoxemia appreciably.  Of note, most recent echo in 1/17 actually visualized only mild to moderate MR.   9. Anemia: Suspect anemia of chronic disease/renal disease but also with component of GI blood loss. Recent admission requiring 5 units PRBCs.  She was FOBT+, but unable to visualized bleeding source on EGD, colonoscopy, or capsule endoscopy.  Seeing hematology, getting Aranesp.  10. Atrial fibrillation: Persistent.  Rate is controlled.  CHADSVASC = 3.  She was evaluated for Watchman but decided not to be a good candidate. She is now on Eliquis, will need to follow closely for evidence of GI bleeding. - CBC today.      Followup in 2 months   [name] 11/22/2015

## 2015-11-22 NOTE — Progress Notes (Signed)
Advanced Heart Failure Medication Review by a Pharmacist  Does the patient  feel that his/her medications are working for him/her?  yes  Has the patient been experiencing any side effects to the medications prescribed?  no  Does the patient measure his/her own blood pressure or blood glucose at home?  no   Does the patient have any problems obtaining medications due to transportation or finances?   no  Understanding of regimen: good Understanding of indications: good Potential of compliance: good Patient understands to avoid NSAIDs. Patient understands to avoid decongestants.  Issues to address at subsequent visits: None   Pharmacist comments:  April Mueller is a pleasant 70 yo F presenting without a medication list but with good recall of her regimen. She reports good compliance with her regimen but states that because she needs to meet a deductible her Eliquis has been expensive and she does not qualify for patient assistance since she has not spent 3% of her annual income on Rx this year yet. We will provide her samples to get her through for a few weeks since she says she will meet her deductible soon. After that, her copay should be $40/mo with BCBS Part D.   Medication Samples have been provided to the patient.  Drug name: Eliquis       Strength: 5 mg        Qty: 56  LOT: [id number]  Exp.Date: 05/19  The patient has been instructed regarding the correct time, dose, and frequency of taking this medication, including desired effects and most common side effects.    [name]. Velva Harman, PharmD, BCPS, CPP Clinical Pharmacist Pager: [phone] Phone: [phone] 11/22/2015 10:36 AM      Time with patient: 10 minutes Preparation and documentation time: 2 minutes Total time: 12 minutes

## 2015-12-01 ENCOUNTER — Telehealth: Payer: Self-pay | Admitting: Hematology

## 2015-12-01 ENCOUNTER — Other Ambulatory Visit: Payer: Medicare Other

## 2015-12-01 ENCOUNTER — Encounter (HOSPITAL_COMMUNITY): Payer: Self-pay | Admitting: *Deleted

## 2015-12-01 ENCOUNTER — Ambulatory Visit: Payer: Medicare Other | Admitting: Hematology

## 2015-12-01 ENCOUNTER — Telehealth (HOSPITAL_COMMUNITY): Payer: Self-pay | Admitting: *Deleted

## 2015-12-01 ENCOUNTER — Emergency Department (HOSPITAL_COMMUNITY)
Admission: EM | Admit: 2015-12-01 | Discharge: 2015-12-01 | Disposition: A | Discharge status: Home / Self Care | Payer: Medicare Other | Attending: Emergency Medicine | Admitting: Emergency Medicine

## 2015-12-01 ENCOUNTER — Ambulatory Visit: Payer: Medicare Other

## 2015-12-01 DIAGNOSIS — Z862 Personal history of diseases of the blood and blood-forming organs and certain disorders involving the immune mechanism: Secondary | ICD-10-CM | POA: Insufficient documentation

## 2015-12-01 DIAGNOSIS — Z7901 Long term (current) use of anticoagulants: Secondary | ICD-10-CM | POA: Diagnosis not present

## 2015-12-01 DIAGNOSIS — I482 Chronic atrial fibrillation, unspecified: Secondary | ICD-10-CM

## 2015-12-01 DIAGNOSIS — R040 Epistaxis: Secondary | ICD-10-CM | POA: Insufficient documentation

## 2015-12-01 DIAGNOSIS — N183 Chronic kidney disease, stage 3 (moderate): Secondary | ICD-10-CM | POA: Diagnosis not present

## 2015-12-01 DIAGNOSIS — Z8739 Personal history of other diseases of the musculoskeletal system and connective tissue: Secondary | ICD-10-CM | POA: Insufficient documentation

## 2015-12-01 DIAGNOSIS — Z87891 Personal history of nicotine dependence: Secondary | ICD-10-CM | POA: Diagnosis not present

## 2015-12-01 DIAGNOSIS — Z79899 Other long term (current) drug therapy: Secondary | ICD-10-CM | POA: Diagnosis not present

## 2015-12-01 DIAGNOSIS — Z9889 Other specified postprocedural states: Secondary | ICD-10-CM | POA: Insufficient documentation

## 2015-12-01 DIAGNOSIS — G4730 Sleep apnea, unspecified: Secondary | ICD-10-CM | POA: Diagnosis not present

## 2015-12-01 DIAGNOSIS — I509 Heart failure, unspecified: Secondary | ICD-10-CM | POA: Diagnosis not present

## 2015-12-01 DIAGNOSIS — I129 Hypertensive chronic kidney disease with stage 1 through stage 4 chronic kidney disease, or unspecified chronic kidney disease: Secondary | ICD-10-CM | POA: Insufficient documentation

## 2015-12-01 MED ORDER — PHENYLEPHRINE HCL 0.5 % NA SOLN
1.0000 [drp] | Freq: Once | NASAL | Status: AC
  Administered 2015-12-01: 1 [drp] via NASAL
  Filled 2015-12-01: qty 15

## 2015-12-01 NOTE — Telephone Encounter (Signed)
pt cld to CX appt-pt in ED-will call back to r/s

## 2015-12-01 NOTE — ED Notes (Signed)
Bed: [id number] Expected date:  Expected time:  Means of arrival:  Comments: EMS 71 yo female from home/nosebleed-on Eliquist/uncontrolled after Afrin

## 2015-12-01 NOTE — Telephone Encounter (Signed)
-----   Message from Larey Dresser, MD sent at 12/01/2015 11:14 AM EDT ----- Please have her restart Eliquis 3 days after packing was done if no more bleeding.  Use saline nasal spray daily to try to prevent recurrence.   ----- Message -----    From: Charlesetta Shanks, MD    Sent: 12/01/2015  10:02 AM      To: Larey Dresser, MD  I saw this patient in the emergency department for epistaxis. A nasal packing has been placed on 4\27. She has history of chronic atrial fibrillation. Patient also has history of GI bleed and anemia. At this time I have instructed her to hold her Eliquis for 2 days and have the packing removed. Please contact the patient to further advise her on continued use of Eliquis.  Thank you, Dr. Charlesetta Shanks

## 2015-12-01 NOTE — ED Notes (Signed)
Per EMS pt coming from home with c/o epistaxis since midnight, pt is on eliquis and home O2. EMS on scene administered 2 doses of afrin, without relief.

## 2015-12-01 NOTE — ED Notes (Signed)
MD at bedside. 

## 2015-12-01 NOTE — ED Notes (Signed)
Pt not actively bleeding at this time, however she is spitting out bloody sputum

## 2015-12-01 NOTE — Discharge Instructions (Signed)
Nosebleed At this time, do not take your Eliquis for 2 days. You need your packing removed in 2 days. This may be done by your family doctor, and ear nose throat specialist or the emergency department if he cannot be seen by your other providers. Nosebleeds are common. They are due to a crack in the inside lining of your nose (mucous membrane) or from a small blood vessel that starts to bleed. Nosebleeds can be caused by many conditions, such as injury, infections, dry mucous membranes or dry climate, medicines, nose picking, and home heating and cooling systems. Most nosebleeds come from blood vessels in the front of your nose. HOME CARE INSTRUCTIONS   Try controlling your nosebleed by pinching your nostrils gently and continuously for at least 10 minutes.  Avoid blowing or sniffing your nose for a number of hours after having a nosebleed.  Do not put gauze inside your nose yourself. If your nose was packed by your health care provider, try to maintain the pack inside of your nose until your health care provider removes it.  If a gauze pack was used and it starts to fall out, gently replace it or cut off the end of it.  If a balloon catheter was used to pack your nose, do not cut or remove it unless your health care provider has instructed you to do that.  Avoid lying down while you are having a nosebleed. Sit up and lean forward.  Use a nasal spray decongestant to help with a nosebleed as directed by your health care provider.  Do not use petroleum jelly or mineral oil in your nose. These can drip into your lungs.  Maintain humidity in your home by using less air conditioning or by using a humidifier.  Aspirinand blood thinners make bleeding more likely. If you are prescribed these medicines and you suffer from nosebleeds, ask your health care provider if you should stop taking the medicines or adjust the dose. Do not stop medicines unless directed by your health care provider  Resume  your normal activities as you are able, but avoid straining, lifting, or bending at the waist for several days.  If your nosebleed was caused by dry mucous membranes, use over-the-counter saline nasal spray or gel. This will keep the mucous membranes moist and allow them to heal. If you must use a lubricant, choose the water-soluble variety. Use it only sparingly, and do not use it within several hours of lying down.  Keep all follow-up visits as directed by your health care provider. This is important. SEEK MEDICAL CARE IF:  You have a fever.  You get frequent nosebleeds.  You are getting nosebleeds more often. SEEK IMMEDIATE MEDICAL CARE IF:  Your nosebleed lasts longer than 20 minutes.  Your nosebleed occurs after an injury to your face, and your nose looks crooked or broken.  You have unusual bleeding from other parts of your body.  You have unusual bruising on other parts of your body.  You feel light-headed or you faint.  You become sweaty.  You vomit blood.  Your nosebleed occurs after a head injury.   This information is not intended to replace advice given to you by your health care provider. Make sure you discuss any questions you have with your health care provider.   Document Released: 05/02/2005 Document Revised: 08/13/2014 Document Reviewed: 03/08/2014 Elsevier Interactive Patient Education [employer].

## 2015-12-01 NOTE — ED Provider Notes (Signed)
CSN: [id number]     Arrival date & time 12/01/15  [id number] History   First MD Initiated Contact with Patient 12/01/15 [phone]     Chief Complaint  Patient presents with  . Epistaxis     (Consider location/radiation/quality/duration/timing/severity/associated sxs/prior Treatment) HPI Patient had nasal bleeding that started at approximately midnight. She reports periods of heavy bleeding and passing clots. Her daughter reports that sometimes it seemed to be coming out of one side than the other. The patient is on Eliquis. She reports an episode of nasal bleeding about 2 years ago. She has not had any since. No associated pain or other symptoms. No recent URI symptoms. Patient does use nasal cannula oxygen. No new or changed shortness of breath, cough, fever, chills or chest pain. Patient is brought by EMS. They tried to nasal sprays of Afrin but patient continued to have bleeding. Past Medical History  Diagnosis Date  . Hypertension   . Pulmonary HTN ([place])   . Aortic aneurysm ([place])   . Sleep apnea     wears CPAP  . CKD (chronic kidney disease) stage 3, GFR 30-59 ml/min   . Anemia 03/23/15    transfusion  . Arthritis   . CHF (congestive heart failure) ([place])   . Gout   . Rheumatoid arthritis(714.0) 11/06/2012   Past Surgical History  Procedure Laterality Date  . Abdominal aortic aneurysm repair    . Right heart catheterization N/A 08/18/2014    Procedure: RIGHT HEART CATH;  Surgeon: Jolaine Artist, MD;  Location: [facility] CATH LAB;  Service: Cardiovascular;  Laterality: N/A;  . Tee without cardioversion N/A 01/05/2015    Procedure: TRANSESOPHAGEAL ECHOCARDIOGRAM (TEE);  Surgeon: Larey Dresser, MD;  Location: [place];  Service: Cardiovascular;  Laterality: N/A;  . Wisdom tooth extraction    . Givens capsule study N/A 08/21/2015    Procedure: GIVENS CAPSULE STUDY;  Surgeon: Gatha Mayer, MD;  Location: [place];  Service: Endoscopy;  Laterality: N/A;  . Esophagogastroduodenoscopy N/A  08/19/2015    Procedure: ESOPHAGOGASTRODUODENOSCOPY (EGD);  Surgeon: Jerene Bears, MD;  Location: [facility] ENDOSCOPY;  Service: Endoscopy;  Laterality: N/A;  patient scheduled, anesthesia aware of 1500 case, per [place].  08/18/15 DP  . Colonoscopy N/A 08/19/2015    Procedure: COLONOSCOPY;  Surgeon: Jerene Bears, MD;  Location: [facility] ENDOSCOPY;  Service: Endoscopy;  Laterality: N/A;  . Cardiac catheterization N/A 10/24/2015    Procedure: Right Heart Cath;  Surgeon: Larey Dresser, MD;  Location: [place] CV LAB;  Service: Cardiovascular;  Laterality: N/A;   Family History  Problem Relation Age of Onset  . Heart attack Mother   . Prostate cancer Father   . Diabetes Brother   . Kidney failure Brother    Social History  Substance Use Topics  . Smoking status: Former Smoker -- 1.50 packs/day for 14 years    Types: Cigarettes    Quit date: 08/09/2003  . Smokeless tobacco: Never Used  . Alcohol Use: No   OB History    No data available     Review of Systems 10 Systems reviewed and are negative for acute change except as noted in the HPI.    Allergies  Review of patient's allergies indicates no known allergies.  Home Medications   Prior to Admission medications   Medication Sig Start Date End Date Taking? Authorizing Provider  allopurinol (ZYLOPRIM) 300 MG tablet Take 300 mg by mouth daily.   Yes Historical Provider, MD  ALPRAZolam Duanne Moron) 0.25 MG tablet  Take 0.25 mg by mouth 2 (two) times daily as needed for anxiety or sleep.    Yes Historical Provider, MD  apixaban (ELIQUIS) 5 MG TABS tablet Take 1 tablet (5 mg total) by mouth 2 (two) times daily. 10/24/15  Yes Larey Dresser, MD  atorvastatin (LIPITOR) 10 MG tablet Take 1 tablet (10 mg total) by mouth daily. 03/22/15  Yes Larey Dresser, MD  b complex vitamins tablet Take 1 tablet by mouth daily.   Yes Historical Provider, MD  Biotin 1 MG CAPS Take 1 capsule by mouth daily.   Yes Historical Provider, MD  hydroxychloroquine (PLAQUENIL)  200 MG tablet Take 200 mg by mouth 2 (two) times daily.   Yes Historical Provider, MD  ketoconazole (NIZORAL) 2 % shampoo Apply 1 application topically as needed for irritation. Leave on 5 minutes before rinsing out 12/15/14  Yes Historical Provider, MD  metoprolol succinate (TOPROL-XL) 25 MG 24 hr tablet Take 1 tablet (25 mg total) by mouth 2 (two) times daily. 09/05/15  Yes Larey Dresser, MD  Omega-3 Fatty Acids (FISH OIL) 1200 MG CAPS Take 1,200 mg by mouth daily.   Yes Historical Provider, MD  OXYGEN Inhale 3-6 L into the lungs continuous.    Yes Historical Provider, MD  potassium chloride SA (KLOR-CON M20) 20 MEQ tablet Take 1 tablet (20 mEq total) by mouth daily. 09/12/15  Yes Larey Dresser, MD  torsemide (DEMADEX) 20 MG tablet Take 40 mg by mouth 2 (two) times daily.   Yes Historical Provider, MD  umeclidinium-vilanterol (ANORO ELLIPTA) 62.5-25 MCG/INH AEPB Inhale 1 puff into the lungs daily.   Yes Historical Provider, MD  acetaminophen (TYLENOL) 325 MG tablet Take 2 tablets (650 mg total) by mouth every 6 (six) hours as needed for mild pain (or Fever >/= 101). Patient not taking: Reported on 12/01/2015 08/25/15   Silver Huguenin Elgergawy, MD   BP 102/55 mmHg  Pulse 56  Temp(Src) 98.3 F (36.8 C) (Oral)  Resp 16  SpO2 100% Physical Exam  Constitutional: She is oriented to person, place, and time.  Patient is mildly obese. She is alert and nontoxic. No respiratory distress. Otherwise well in appearance.  HENT:  Head: Normocephalic and atraumatic.  Red blood trickling from the left nare. Right naris clear. Patient has passage of air through the nare with blowing. No clot obstructing the nasal passage. Positive blood tracking down the posterior oropharynx. Left TM has small rim of hemotympanum. Right TM normal.  Eyes: EOM are normal.  Neck: Neck supple.  Cardiovascular:  Irregularly irregular. No gross rub murmur gallop.  Pulmonary/Chest: Effort normal.  Adequate bilateral airflow. Mild  basilar rail. No gross wheeze or rhonchi. No respiratory distress.  Abdominal: Soft. She exhibits no distension.  Neurological: She is alert and oriented to person, place, and time. Coordination normal.  Skin: Skin is warm and dry.  Psychiatric: She has a normal mood and affect.    ED Course  .Epistaxis Management Date/Time: 12/01/2015 8:04 AM Performed by: Charlesetta Shanks Authorized by: Charlesetta Shanks Treatment site: left posterior Repair method: merocel sponge Post-procedure assessment: bleeding stopped Treatment complexity: complex Patient tolerance: Patient tolerated the procedure well with no immediate complications Comments: Patient's nasal passage cleared by blowing. She has clear passage of air through the left near. Persistent trickle of blood. Neo-Synephrine instilled into the nasal passage. 7.5 cm Merocel sponge placed without difficulty. Expanded with Neo-Synephrine. No bleeding postprocedure. Will recheck in 20 minutes.   (including critical care time) Recheck 09:00  patient has had trickle of blood from anterior nare. No apparent bleeding posteriorly. It as a lean gauze added to anterior portion of packing. Will continue to observe for bleeding.   Recheck 09:48 no rebleeding. No posterior drip.  Labs Review Labs Reviewed - No data to display  Imaging Review No results found. I have personally reviewed and evaluated these images and lab results as part of my medical decision-making.   EKG Interpretation None      MDM   Final diagnoses:  Epistaxis  Chronic atrial fibrillation ([place])   Patient has developed a nosebleed on Eliquis. I have placed a nasal packing with a 7.5 cm sponge and anterior packing with Vaseline gauze. Reviewed EMR indicates the patient has had problems with GI bleed and anemia as well. She has been restarted on Eliquis as she had not had recurrence of bleeding. At this time I will have her hold Eliquis for 2 days and contact her family physician  and cardiologist.    Charlesetta Shanks, MD 12/01/15 [phone]

## 2015-12-01 NOTE — Telephone Encounter (Signed)
Spoke with pt and advised her of eliquis restart. See previous messages

## 2015-12-01 NOTE — Telephone Encounter (Signed)
pt cld to r/s appt-gave pt r/s time & date for 5/@9 

## 2015-12-03 ENCOUNTER — Emergency Department (HOSPITAL_COMMUNITY)
Admission: EM | Admit: 2015-12-03 | Discharge: 2015-12-03 | Disposition: A | Discharge status: Home / Self Care | Payer: Medicare Other | Attending: Emergency Medicine | Admitting: Emergency Medicine

## 2015-12-03 ENCOUNTER — Encounter (HOSPITAL_COMMUNITY): Payer: Self-pay | Admitting: Emergency Medicine

## 2015-12-03 DIAGNOSIS — I129 Hypertensive chronic kidney disease with stage 1 through stage 4 chronic kidney disease, or unspecified chronic kidney disease: Secondary | ICD-10-CM | POA: Diagnosis not present

## 2015-12-03 DIAGNOSIS — I509 Heart failure, unspecified: Secondary | ICD-10-CM | POA: Insufficient documentation

## 2015-12-03 DIAGNOSIS — Z79899 Other long term (current) drug therapy: Secondary | ICD-10-CM | POA: Diagnosis not present

## 2015-12-03 DIAGNOSIS — N183 Chronic kidney disease, stage 3 (moderate): Secondary | ICD-10-CM | POA: Diagnosis not present

## 2015-12-03 DIAGNOSIS — Z9981 Dependence on supplemental oxygen: Secondary | ICD-10-CM | POA: Insufficient documentation

## 2015-12-03 DIAGNOSIS — G4733 Obstructive sleep apnea (adult) (pediatric): Secondary | ICD-10-CM | POA: Diagnosis not present

## 2015-12-03 DIAGNOSIS — Z4800 Encounter for change or removal of nonsurgical wound dressing: Secondary | ICD-10-CM

## 2015-12-03 DIAGNOSIS — Z862 Personal history of diseases of the blood and blood-forming organs and certain disorders involving the immune mechanism: Secondary | ICD-10-CM | POA: Diagnosis not present

## 2015-12-03 DIAGNOSIS — Z87891 Personal history of nicotine dependence: Secondary | ICD-10-CM | POA: Insufficient documentation

## 2015-12-03 DIAGNOSIS — Z9889 Other specified postprocedural states: Secondary | ICD-10-CM | POA: Diagnosis not present

## 2015-12-03 DIAGNOSIS — Z4801 Encounter for change or removal of surgical wound dressing: Secondary | ICD-10-CM | POA: Insufficient documentation

## 2015-12-03 DIAGNOSIS — Z7901 Long term (current) use of anticoagulants: Secondary | ICD-10-CM | POA: Diagnosis not present

## 2015-12-03 DIAGNOSIS — M109 Gout, unspecified: Secondary | ICD-10-CM | POA: Diagnosis not present

## 2015-12-03 MED ORDER — BACITRACIN ZINC 500 UNIT/GM EX OINT
1.0000 "application " | TOPICAL_OINTMENT | Freq: Once | CUTANEOUS | Status: DC
  Filled 2015-12-03: qty 28.35

## 2015-12-03 MED ORDER — BACITRACIN ZINC 500 UNIT/GM EX OINT
TOPICAL_OINTMENT | CUTANEOUS | Status: AC
  Filled 2015-12-03: qty 0.9

## 2015-12-03 NOTE — Discharge Instructions (Signed)
Humidify the air and apply bacitracin to the inside of the nose this will help prevent future nosebleeds. If the bleeding recurs spray Afrin into the nose and hold pressure for at least 10 minutes. ° °Please follow with your primary care doctor in the next 2 days for a check-up. They must obtain records for further management.  ° °Do not hesitate to return to the Emergency Department for any new, worsening or concerning symptoms.  ° ° °

## 2015-12-03 NOTE — ED Provider Notes (Signed)
CSN: [id number]     Arrival date & time 12/03/15  1239 History  By signing my name below, I, Dora Sims, attest that this documentation has been prepared under the direction and in the presence of non-physician practitioner, Monico Blitz, PA-C. Electronically Signed: Dora Sims, Scribe. 12/03/2015. 1:06 PM.    Chief Complaint  Patient presents with  . packing removal     The history is provided by the patient. No language interpreter was used.     HPI Comments: AVIONA CLUTE is a 70 y.o. female who presents to the Emergency Department requesting removal of nasal packing placed in the ER 2 days ago due to severe epistaxis. She states that she went to her PCP today for packing removal and they unsuccessfully attempted to remove it. She has not taken Eliquis for the past couple of days as recommended in the ER. She states that she had a similar, severe, episode of epistaxis 2 years ago. Pt uses oxygen at home. She denies tasting blood or uncontrolled bleeding. Pt is hesitant to restart Eliquis and will set up an appointment with her cardiologist to discuss options.  Past Medical History  Diagnosis Date  . Hypertension   . Pulmonary HTN ([place])   . Aortic aneurysm ([place])   . Sleep apnea     wears CPAP  . CKD (chronic kidney disease) stage 3, GFR 30-59 ml/min   . Anemia 03/23/15    transfusion  . Arthritis   . CHF (congestive heart failure) ([place])   . Gout   . Rheumatoid arthritis(714.0) 11/06/2012   Past Surgical History  Procedure Laterality Date  . Abdominal aortic aneurysm repair    . Right heart catheterization N/A 08/18/2014    Procedure: RIGHT HEART CATH;  Surgeon: Jolaine Artist, MD;  Location: [facility] CATH LAB;  Service: Cardiovascular;  Laterality: N/A;  . Tee without cardioversion N/A 01/05/2015    Procedure: TRANSESOPHAGEAL ECHOCARDIOGRAM (TEE);  Surgeon: Larey Dresser, MD;  Location: [place];  Service: Cardiovascular;  Laterality: N/A;  . Wisdom tooth extraction     . Givens capsule study N/A 08/21/2015    Procedure: GIVENS CAPSULE STUDY;  Surgeon: Gatha Mayer, MD;  Location: [place];  Service: Endoscopy;  Laterality: N/A;  . Esophagogastroduodenoscopy N/A 08/19/2015    Procedure: ESOPHAGOGASTRODUODENOSCOPY (EGD);  Surgeon: Jerene Bears, MD;  Location: [facility] ENDOSCOPY;  Service: Endoscopy;  Laterality: N/A;  patient scheduled, anesthesia aware of 1500 case, per [place].  08/18/15 DP  . Colonoscopy N/A 08/19/2015    Procedure: COLONOSCOPY;  Surgeon: Jerene Bears, MD;  Location: [facility] ENDOSCOPY;  Service: Endoscopy;  Laterality: N/A;  . Cardiac catheterization N/A 10/24/2015    Procedure: Right Heart Cath;  Surgeon: Larey Dresser, MD;  Location: [place] CV LAB;  Service: Cardiovascular;  Laterality: N/A;   Family History  Problem Relation Age of Onset  . Heart attack Mother   . Prostate cancer Father   . Diabetes Brother   . Kidney failure Brother    Social History  Substance Use Topics  . Smoking status: Former Smoker -- 1.50 packs/day for 14 years    Types: Cigarettes    Quit date: 08/09/2003  . Smokeless tobacco: Never Used  . Alcohol Use: No   OB History    No data available     Review of Systems  A complete 10 system review of systems was obtained and all systems are negative except as noted in the HPI and PMH.   Allergies  Review of patient's allergies indicates no known allergies.  Home Medications   Prior to Admission medications   Medication Sig Start Date End Date Taking? Authorizing Provider  acetaminophen (TYLENOL) 325 MG tablet Take 2 tablets (650 mg total) by mouth every 6 (six) hours as needed for mild pain (or Fever >/= 101). Patient not taking: Reported on 12/01/2015 08/25/15   Silver Huguenin Elgergawy, MD  allopurinol (ZYLOPRIM) 300 MG tablet Take 300 mg by mouth daily.    Historical Provider, MD  ALPRAZolam Duanne Moron) 0.25 MG tablet Take 0.25 mg by mouth 2 (two) times daily as needed for anxiety or sleep.     Historical  Provider, MD  apixaban (ELIQUIS) 5 MG TABS tablet Take 1 tablet (5 mg total) by mouth 2 (two) times daily. 10/24/15   Larey Dresser, MD  atorvastatin (LIPITOR) 10 MG tablet Take 1 tablet (10 mg total) by mouth daily. 03/22/15   Larey Dresser, MD  b complex vitamins tablet Take 1 tablet by mouth daily.    Historical Provider, MD  Biotin 1 MG CAPS Take 1 capsule by mouth daily.    Historical Provider, MD  hydroxychloroquine (PLAQUENIL) 200 MG tablet Take 200 mg by mouth 2 (two) times daily.    Historical Provider, MD  ketoconazole (NIZORAL) 2 % shampoo Apply 1 application topically as needed for irritation. Leave on 5 minutes before rinsing out 12/15/14   Historical Provider, MD  metoprolol succinate (TOPROL-XL) 25 MG 24 hr tablet Take 1 tablet (25 mg total) by mouth 2 (two) times daily. 09/05/15   Larey Dresser, MD  Omega-3 Fatty Acids (FISH OIL) 1200 MG CAPS Take 1,200 mg by mouth daily.    Historical Provider, MD  OXYGEN Inhale 3-6 L into the lungs continuous.     Historical Provider, MD  potassium chloride SA (KLOR-CON M20) 20 MEQ tablet Take 1 tablet (20 mEq total) by mouth daily. 09/12/15   Larey Dresser, MD  torsemide (DEMADEX) 20 MG tablet Take 40 mg by mouth 2 (two) times daily.    Historical Provider, MD  umeclidinium-vilanterol (ANORO ELLIPTA) 62.5-25 MCG/INH AEPB Inhale 1 puff into the lungs daily.    Historical Provider, MD   BP 139/84 mmHg  Pulse 60  Temp(Src) 97.9 F (36.6 C) (Oral)  Resp 18  SpO2 96% Physical Exam  Constitutional: She is oriented to person, place, and time. She appears well-developed and well-nourished. No distress.  HENT:  Head: Normocephalic and atraumatic.  Mouth/Throat: Oropharynx is clear and moist.  Packing in place to left nares with no active bleeding  Eyes: Conjunctivae and EOM are normal. Pupils are equal, round, and reactive to light.  Neck: Normal range of motion. Neck supple. No tracheal deviation present.  Cardiovascular: Normal rate, regular  rhythm and intact distal pulses.   Pulmonary/Chest: Effort normal and breath sounds normal. No respiratory distress.  Abdominal: Soft. There is no tenderness.  Musculoskeletal: Normal range of motion.  Neurological: She is alert and oriented to person, place, and time.  Skin: Skin is warm and dry. She is not diaphoretic.  Psychiatric: She has a normal mood and affect. Her behavior is normal.  Nursing note and vitals reviewed.   ED Course  .Epistaxis Management Date/Time: 12/03/2015 1:33 PM Performed by: Monico Blitz Authorized by: Monico Blitz Patient tolerance: Patient tolerated the procedure well with no immediate complications Comments: mirocel removed without complication, no further bleeding.   (including critical care time)  DIAGNOSTIC STUDIES: Oxygen Saturation is 96% on [place], adequate  by my interpretation.    COORDINATION OF CARE: 1:06 PM Discussed treatment plan with pt at bedside and pt agreed to plan.  Labs Review Labs Reviewed - No data to display  Imaging Review No results found. I have personally reviewed and evaluated these images and lab results as part of my medical decision-making.   EKG Interpretation None      MDM   Final diagnoses:  Encounter for removal of nasal packing    Filed Vitals:   12/03/15 1247  BP: 139/84  Pulse: 60  Temp: 97.9 F (36.6 C)  TempSrc: Oral  Resp: 18  SpO2: 96%    Medications  bacitracin ointment 1 application (not administered)  bacitracin 500 UNIT/GM ointment (not administered)    AVERIANNA GHAFFARI is 70 y.o. female presenting nasal packing removal, patient states that the bleeding has been controlled, she's not taken her anticoagulation several days. Packing removed and Neosporin applied to nasal mucosa. Patient will follow with her cardiologist for discussion of when to resume Eliquis.  Evaluation does not show pathology that would require ongoing emergent intervention or inpatient treatment. Pt is  hemodynamically stable and mentating appropriately. Discussed findings and plan with patient/guardian, who agrees with care plan. All questions answered. Return precautions discussed and outpatient follow up given.   I personally performed the services described in this documentation, which was scribed in my presence. The recorded information has been reviewed and is accurate.   Monico Blitz, PA-C 12/03/15 [place], MD 12/04/15 [phone]

## 2015-12-03 NOTE — Progress Notes (Signed)
Pt stated she was started on xarelto for afib and developed a nose bleed. Pt is here now to get the pacing removed.

## 2015-12-03 NOTE — ED Notes (Signed)
Patient reports for removal of nasal packing placed on Thursday. States she attempted to go to PCP for removal but they would not remove packing.

## 2015-12-05 ENCOUNTER — Encounter: Payer: Self-pay | Admitting: Hematology

## 2015-12-05 ENCOUNTER — Ambulatory Visit (HOSPITAL_BASED_OUTPATIENT_CLINIC_OR_DEPARTMENT_OTHER): Payer: Medicare Other | Admitting: Hematology

## 2015-12-05 ENCOUNTER — Other Ambulatory Visit (HOSPITAL_BASED_OUTPATIENT_CLINIC_OR_DEPARTMENT_OTHER): Payer: Medicare Other

## 2015-12-05 ENCOUNTER — Ambulatory Visit: Payer: Medicare Other

## 2015-12-05 ENCOUNTER — Telehealth: Payer: Self-pay | Admitting: Hematology

## 2015-12-05 VITALS — BP 95/63 | HR 61 | Temp 98.2°F | Resp 17 | Wt 218.4 lb

## 2015-12-05 DIAGNOSIS — D631 Anemia in chronic kidney disease: Secondary | ICD-10-CM | POA: Diagnosis not present

## 2015-12-05 DIAGNOSIS — N189 Chronic kidney disease, unspecified: Secondary | ICD-10-CM

## 2015-12-05 DIAGNOSIS — I272 Other secondary pulmonary hypertension: Secondary | ICD-10-CM

## 2015-12-05 DIAGNOSIS — D696 Thrombocytopenia, unspecified: Secondary | ICD-10-CM

## 2015-12-05 DIAGNOSIS — D500 Iron deficiency anemia secondary to blood loss (chronic): Secondary | ICD-10-CM

## 2015-12-05 DIAGNOSIS — D469 Myelodysplastic syndrome, unspecified: Secondary | ICD-10-CM

## 2015-12-05 DIAGNOSIS — I509 Heart failure, unspecified: Secondary | ICD-10-CM

## 2015-12-05 DIAGNOSIS — M069 Rheumatoid arthritis, unspecified: Secondary | ICD-10-CM | POA: Diagnosis not present

## 2015-12-05 DIAGNOSIS — D638 Anemia in other chronic diseases classified elsewhere: Secondary | ICD-10-CM

## 2015-12-05 LAB — COMPREHENSIVE METABOLIC PANEL
ALT: 15 U/L (ref 0–55)
ANION GAP: 8 meq/L (ref 3–11)
AST: 21 U/L (ref 5–34)
Albumin: 3.4 g/dL — ABNORMAL LOW (ref 3.5–5.0)
Alkaline Phosphatase: 79 U/L (ref 40–150)
BILIRUBIN TOTAL: 0.43 mg/dL (ref 0.20–1.20)
BUN: 40.4 mg/dL — ABNORMAL HIGH (ref 7.0–26.0)
CHLORIDE: 107 meq/L (ref 98–109)
CO2: 28 meq/L (ref 22–29)
Calcium: 10.3 mg/dL (ref 8.4–10.4)
Creatinine: 2 mg/dL — ABNORMAL HIGH (ref 0.6–1.1)
EGFR: 30 mL/min/{1.73_m2} — ABNORMAL LOW (ref >90)
GLUCOSE: 123 mg/dL (ref 70–140)
Potassium: 3.5 mEq/L (ref 3.5–5.1)
SODIUM: 143 meq/L (ref 136–145)
TOTAL PROTEIN: 7.4 g/dL (ref 6.4–8.3)

## 2015-12-05 LAB — CBC & DIFF AND RETIC
BASO%: 0.3 % (ref 0.0–2.0)
Basophils Absolute: 0 10*3/uL (ref 0.0–0.1)
EOS%: 1.8 % (ref 0.0–7.0)
Eosinophils Absolute: 0.1 10*3/uL (ref 0.0–0.5)
HCT: 32.1 % — ABNORMAL LOW (ref 34.8–46.6)
HGB: 9.8 g/dL — ABNORMAL LOW (ref 11.6–15.9)
IMMATURE RETIC FRACT: 4.6 % (ref 1.60–10.00)
LYMPH#: 0.9 10*3/uL (ref 0.9–3.3)
LYMPH%: 14.4 % (ref 14.0–49.7)
MCH: 29.8 pg (ref 25.1–34.0)
MCHC: 30.5 g/dL — AB (ref 31.5–36.0)
MCV: 97.6 fL (ref 79.5–101.0)
MONO#: 0.4 10*3/uL (ref 0.1–0.9)
MONO%: 5.3 % (ref 0.0–14.0)
NEUT%: 78.2 % — AB (ref 38.4–76.8)
NEUTROS ABS: 5.1 10*3/uL (ref 1.5–6.5)
PLATELETS: 103 10*3/uL — AB (ref 145–400)
RBC: 3.29 10*6/uL — AB (ref 3.70–5.45)
RDW: 16 % — AB (ref 11.2–14.5)
RETIC %: 1.54 % (ref 0.70–2.10)
RETIC CT ABS: 50.67 10*3/uL (ref 33.70–90.70)
WBC: 6.6 10*3/uL (ref 3.9–10.3)

## 2015-12-05 LAB — FERRITIN: Ferritin: 247 ng/ml (ref 9–269)

## 2015-12-05 MED ORDER — DARBEPOETIN ALFA 100 MCG/0.5ML IJ SOSY
50.0000 ug | PREFILLED_SYRINGE | Freq: Once | INTRAMUSCULAR | Status: AC
  Administered 2015-12-05: 50 ug via SUBCUTANEOUS
  Filled 2015-12-05: qty 0.5

## 2015-12-05 NOTE — Telephone Encounter (Signed)
Gave and printed appt sched and avs for pt for may and June °

## 2015-12-05 NOTE — Progress Notes (Signed)
April Mueller    HEMATOLOGY/ONCOLOGY CLINIC NOTE  Date of Service: .12/05/2015  Patient Care Team: April Kettle, MD as PCP - General (Family Medicine)  CHIEF COMPLAINTS/PURPOSE OF CONSULTATION: f/u for Anemia  Diagnosis: Normocytic Normochromic Anemia likely multifactorial - CKD/RA/GIB/MDS  Treatment: Aranesp + IV feraheme as needed  HISTORY OF PRESENTING ILLNESS: Please see my initial consultation for details on initial presentation.  INTERVAL HISTORY  [name] is here for her scheduled follow-up for her anemia. She received Feraheme IV after her last clinic visit. Notes that she came back for her Aranesp but could not weights and she had a ride scheduled. She was restarted on Elquis by her cardiologist a couple weeks ago And notes that she developed severe epistaxis requiring an emergency room visit and nasal packing to stop the bleeding . She has been off anticoagulation since then . She called her cardiologist's office and was asked to hold her anticoagulation until she is seen in clinic this week to discuss further options . She has been using nasal Vaseline and saline sprays as per recommendations . No overt GI bleeding. No other acute bleeds. Hemoglobin level fairly stable at 9.8 today.     MEDICAL HISTORY:  Past Medical History  Diagnosis Date  . Hypertension   . Pulmonary HTN ([place])   . Aortic aneurysm ([place])   . Sleep apnea     wears CPAP  . CKD (chronic kidney disease) stage 3, GFR 30-59 ml/min   . Anemia 03/23/15    transfusion  . Arthritis   . CHF (congestive heart failure) ([place])   . Gout   . Rheumatoid arthritis(714.0) 11/06/2012    SURGICAL HISTORY: Past Surgical History  Procedure Laterality Date  . Abdominal aortic aneurysm repair    . Right heart catheterization N/A 08/18/2014    Procedure: RIGHT HEART CATH;  Surgeon: April Artist, MD;  Location: [facility] CATH LAB;  Service: Cardiovascular;  Laterality: N/A;  . Tee without cardioversion N/A 01/05/2015    Procedure:  TRANSESOPHAGEAL ECHOCARDIOGRAM (TEE);  Surgeon: April Dresser, MD;  Location: [place];  Service: Cardiovascular;  Laterality: N/A;  . Wisdom tooth extraction    . Givens capsule study N/A 08/21/2015    Procedure: GIVENS CAPSULE STUDY;  Surgeon: April Mayer, MD;  Location: [place];  Service: Endoscopy;  Laterality: N/A;  . Esophagogastroduodenoscopy N/A 08/19/2015    Procedure: ESOPHAGOGASTRODUODENOSCOPY (EGD);  Surgeon: April Bears, MD;  Location: [facility] ENDOSCOPY;  Service: Endoscopy;  Laterality: N/A;  patient scheduled, anesthesia aware of 1500 case, per April Mueller.  08/18/15 DP  . Colonoscopy N/A 08/19/2015    Procedure: COLONOSCOPY;  Surgeon: April Bears, MD;  Location: [facility] ENDOSCOPY;  Service: Endoscopy;  Laterality: N/A;  . Cardiac catheterization N/A 10/24/2015    Procedure: Right Heart Cath;  Surgeon: April Dresser, MD;  Location: [place] CV LAB;  Service: Cardiovascular;  Laterality: N/A;    SOCIAL HISTORY: Social History   Social History  . Marital Status: Legally Separated    Spouse Name: N/A  . Number of Children: 1  . Years of Education: N/A   Occupational History  . Disability     Office Work   Social History Main Topics  . Smoking status: Former Smoker -- 1.50 packs/day for 14 years    Types: Cigarettes    Quit date: 08/09/2003  . Smokeless tobacco: Never Used  . Alcohol Use: No  . Drug Use: No  . Sexual Activity: Not Currently   Other Topics Concern  .  Not on file   Social History Narrative    FAMILY HISTORY: Family History  Problem Relation Age of Onset  . Heart attack Mother   . Prostate cancer Father   . Diabetes Brother   . Kidney failure Brother     ALLERGIES:  has No Known Allergies.  MEDICATIONS:  Current Outpatient Prescriptions  Medication Sig Dispense Refill  . acetaminophen (TYLENOL) 325 MG tablet Take 2 tablets (650 mg total) by mouth every 6 (six) hours as needed for mild pain (or Fever >/= 101). (Patient not taking:  Reported on 12/01/2015)    . allopurinol (ZYLOPRIM) 300 MG tablet Take 300 mg by mouth daily.    April Mueller ALPRAZolam (XANAX) 0.25 MG tablet Take 0.25 mg by mouth 2 (two) times daily as needed for anxiety or sleep.     April Mueller apixaban (ELIQUIS) 5 MG TABS tablet Take 1 tablet (5 mg total) by mouth 2 (two) times daily. 180 tablet 3  . atorvastatin (LIPITOR) 10 MG tablet Take 1 tablet (10 mg total) by mouth daily. 90 tablet 3  . b complex vitamins tablet Take 1 tablet by mouth daily.    . Biotin 1 MG CAPS Take 1 capsule by mouth daily.    . hydroxychloroquine (PLAQUENIL) 200 MG tablet Take 200 mg by mouth 2 (two) times daily.    April Mueller ketoconazole (NIZORAL) 2 % shampoo Apply 1 application topically as needed for irritation. Leave on 5 minutes before rinsing out  3  . metoprolol succinate (TOPROL-XL) 25 MG 24 hr tablet Take 1 tablet (25 mg total) by mouth 2 (two) times daily. 180 tablet 1  . Omega-3 Fatty Acids (FISH OIL) 1200 MG CAPS Take 1,200 mg by mouth daily.    . OXYGEN Inhale 3-6 L into the lungs continuous.     . potassium chloride SA (KLOR-CON M20) 20 MEQ tablet Take 1 tablet (20 mEq total) by mouth daily. 30 tablet 6  . torsemide (DEMADEX) 20 MG tablet Take 40 mg by mouth 2 (two) times daily.    April Mueller umeclidinium-vilanterol (ANORO ELLIPTA) 62.5-25 MCG/INH AEPB Inhale 1 puff into the lungs daily.     No current facility-administered medications for this visit.    REVIEW OF SYSTEMS:    10 Point review of Systems was done is negative except as noted above.  PHYSICAL EXAMINATION: ECOG PERFORMANCE STATUS: 3 - Symptomatic, >50% confined to bed  . Filed Vitals:   12/05/15 0906  Weight: 218 lb 6.4 oz (99.066 kg)   Filed Weights   12/05/15 0906  Weight: 218 lb 6.4 oz (99.066 kg)   .Body mass index is 37.47 kg/(m^2).  GENERAL:alert, in no acute distress and comfortable on [place] oxygen SKIN: skin color, texture, turgor are normal, no rashes or significant lesions EYES: normal, conjunctiva are pink and  non-injected, sclera clear OROPHARYNX:no exudate, no erythema and lips, buccal mucosa, and tongue normal  NECK: supple, no JVD, thyroid normal size, non-tender, without nodularity LYMPH:  no palpable lymphadenopathy in the cervical, axillary or inguinal LUNGS: clear to auscultation with normal respiratory effort HEART: regular rate & rhythm,  no murmurs and no lower extremity edema ABDOMEN: abdomen soft, non-tender, normoactive bowel sounds  Musculoskeletal: no cyanosis of digits and no clubbing  PSYCH: alert & oriented x 3 with fluent speech NEURO: no focal motor/sensory deficits  LABORATORY DATA:  I have reviewed the data as listed  . CBC Latest Ref Rng 12/05/2015 11/22/2015 11/03/2015  WBC 3.9 - 10.3 10e3/uL 6.6 5.0 6.4  Hemoglobin 11.6 -  15.9 g/dL 9.8(L) 10.3(L) 10.2(L)  Hematocrit 34.8 - 46.6 % 32.1(L) 35.0(L) 33.5(L)  Platelets 145 - 400 10e3/uL 103(L) 109(L) 125(L)     . CMP Latest Ref Rng 12/05/2015 11/22/2015 11/03/2015  Glucose 70 - 140 mg/dl 123 121(H) 114  BUN 7.0 - 26.0 mg/dL 40.4(H) 34(H) 39.7(H)  Creatinine 0.6 - 1.1 mg/dL 2.0(H) 2.13(H) 2.3(H)  Sodium 136 - 145 mEq/L 143 141 143  Potassium 3.5 - 5.1 mEq/L 3.5 3.5 3.7  Chloride 101 - 111 mmol/L - 104 -  CO2 22 - 29 mEq/L 28 27 28   Calcium 8.4 - 10.4 mg/dL 10.3 10.4(H) 10.2  Total Protein 6.4 - 8.3 g/dL 7.4 - 7.7  Total Bilirubin 0.20 - 1.20 mg/dL 0.43 - 0.37  Alkaline Phos 40 - 150 U/L 79 - 71  AST 5 - 34 U/L 21 - 20  ALT 0 - 55 U/L 15 - 13   .  Lab Results  Component Value Date   FERRITIN 99 11/03/2015    RADIOGRAPHIC STUDIES: I have personally reviewed the radiological images as listed and agreed with the findings in the report. No results found.  ASSESSMENT & PLAN:   70 year old female with multiple medical comorbidities including hypertension, COPD, CHF, gout, rheumatoid arthritis, moderate mitral regurgitation with  #1 Normocytic Normochromic anemia.-Her anemia is likely multifactorial from her  chronic kidney disease plus rheumatoid arthritis. Her allopurinol and plaquenil could certainly be an additional factors especially if there is worsening renal function. 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 SPEP with no monoclonal protein. No clinical evidence of splenomegaly to suggest overt hypersplenism. Hemoglobin stable at 9.8 today despite having significant epistaxis recently per her report Previously developed Acute on Chronic Anemia due to GI bleeding requiring 5 units of PRBCs and IV feraheme in the hospital. #2 Mild thrombocytopenia -stable  #2 rheumatoid arthritis #3 chronic kidney disease #4 pulmonary hypertension #5 CHF  Plan -hgb today is stable at 9.8. -Elevating ferritin levels on labs today. Continue when necessary IV Feraheme to maintain ferritin more than 100. -continue on low-dose Aranesp 40 g every 4 weeks  to treat anemia of chronic disease due to CKD and rheumatoid arthritis as well as likely element of MDS. will increase dose as needed to target hemoglobin around 10-11 and hold for hemoglobin more than 11.  -Due for her next dose of Aranesp today -Follow-up with cardiology to determine anticoagulation plan in light of her recent significant epistaxis and previous history of recurrent GI bleeding. -Continue optimal treatment of rheumatoid arthritis -Continue follow-up with primary care physician for other medical cares.   Return to care with Dr. Irene Limbo in 2 month and monthly labs  CBC, CMP and ferritin  All of the patients questions were answered to her apparent satisfaction. The patient knows to call the clinic with any problems, questions or concerns.  I spent 15 minutes counseling the patient face to face. The total time spent in the appointment was 20 minutes and more than 50% was on counseling and direct patient cares and reviewed her extensive inpatient records and results    Sullivan Lone MD [place] AAHIVMS [facility]  [facility] Hematology/Oncology Physician [facility]  (Office):       [phone] (Work cell):  [phone] (Fax):           [phone]

## 2015-12-06 ENCOUNTER — Telehealth (HOSPITAL_COMMUNITY): Payer: Self-pay | Admitting: Pharmacist

## 2015-12-06 ENCOUNTER — Telehealth (HOSPITAL_COMMUNITY): Payer: Self-pay

## 2015-12-06 NOTE — Telephone Encounter (Signed)
Patient left VM on CHF clinic triage line regarding eliquis that she was instructed to restart back today per Dr. Aundra Dubin. Patient would like to speak to pharmacist or Dr. Aundra Dubin to ask some questions before restarting this medication back. Will forward to CHF pharmacist Doroteo Bradford to address.  Renee Pain

## 2015-12-06 NOTE — Telephone Encounter (Signed)
April Mueller called with concerns about restarting her Eliquis since being seen in the ED last Thursday with significant epistaxis. She states that the packing was removed successfully on Saturday 4/29 and she has not had any bleeding issues since then. She also saw her hematologist yesterday and CBC looked stable. Dr. Aundra Dubin recommended restarting her Eliquis today and I reiterated this to her. Based on her age, weight, renal and hepatic function she is on the appropriate dose for her. She asked about switching to warfarin and we discussed the fact that in clinical trials this medication carries a lower risk of bleeding vs warfarin. She also expressed concerns with the risk for internal bleeding which she saw on a commercial. I advised her that the risk for this type of bleeding is also lower with Eliquis vs warfarin and to seek emergency medical attention with any head trauma. I also reassured her that since hematology is following her CBC closely, she will be advised of any significant hgb drops that might indicate internal bleeding. She agreed to restarting Eliquis tonight and will call with any s/s bleeding.

## 2015-12-08 ENCOUNTER — Ambulatory Visit: Payer: Medicare Other

## 2015-12-28 ENCOUNTER — Telehealth (HOSPITAL_COMMUNITY): Payer: Self-pay | Admitting: Surgery

## 2015-12-28 MED ORDER — TORSEMIDE 20 MG PO TABS: 40.0000 mg | ORAL_TABLET | Freq: Two times a day (BID) | ORAL | Status: DC

## 2015-12-28 NOTE — Telephone Encounter (Signed)
Patient called to inquire about getting Eliquis samples from [facility].  She will be here at the clinic tomorrow to pick up samples.  She also wanted Demadex prescription sent to [facility].  This prescription has been sent.

## 2015-12-29 ENCOUNTER — Telehealth (HOSPITAL_COMMUNITY): Payer: Self-pay | Admitting: *Deleted

## 2015-12-29 NOTE — Telephone Encounter (Signed)
Pt came by for samples of Eliquis  Medication Samples have been provided to the patient.  Drug name: Eliquis       Strength: 5 mg        Qty: 4 boxes  LOT: [id number]  Exp.Date: 6/19  Dosing instructions: 1 tab Twice daily   The patient has been instructed regarding the correct time, dose, and frequency of taking this medication, including desired effects and most common side effects.   Avie Checo 10:11 AM 12/29/2015

## 2016-01-03 ENCOUNTER — Ambulatory Visit (HOSPITAL_BASED_OUTPATIENT_CLINIC_OR_DEPARTMENT_OTHER): Payer: Medicare Other

## 2016-01-03 ENCOUNTER — Other Ambulatory Visit (HOSPITAL_BASED_OUTPATIENT_CLINIC_OR_DEPARTMENT_OTHER): Payer: Medicare Other

## 2016-01-03 VITALS — BP 116/43 | HR 58 | Temp 98.3°F | Resp 20

## 2016-01-03 DIAGNOSIS — D500 Iron deficiency anemia secondary to blood loss (chronic): Secondary | ICD-10-CM

## 2016-01-03 DIAGNOSIS — N189 Chronic kidney disease, unspecified: Secondary | ICD-10-CM

## 2016-01-03 DIAGNOSIS — D631 Anemia in chronic kidney disease: Secondary | ICD-10-CM

## 2016-01-03 DIAGNOSIS — D469 Myelodysplastic syndrome, unspecified: Secondary | ICD-10-CM

## 2016-01-03 DIAGNOSIS — D638 Anemia in other chronic diseases classified elsewhere: Secondary | ICD-10-CM

## 2016-01-03 LAB — COMPREHENSIVE METABOLIC PANEL
ALBUMIN: 3.4 g/dL — AB (ref 3.5–5.0)
ALK PHOS: 86 U/L (ref 40–150)
ALT: 11 U/L (ref 0–55)
ANION GAP: 9 meq/L (ref 3–11)
AST: 16 U/L (ref 5–34)
BILIRUBIN TOTAL: 0.41 mg/dL (ref 0.20–1.20)
BUN: 46 mg/dL — ABNORMAL HIGH (ref 7.0–26.0)
CO2: 31 meq/L — AB (ref 22–29)
CREATININE: 2 mg/dL — AB (ref 0.6–1.1)
Calcium: 10.7 mg/dL — ABNORMAL HIGH (ref 8.4–10.4)
Chloride: 103 mEq/L (ref 98–109)
EGFR: 29 mL/min/{1.73_m2} — AB (ref >90)
GLUCOSE: 116 mg/dL (ref 70–140)
Potassium: 3.7 mEq/L (ref 3.5–5.1)
Sodium: 143 mEq/L (ref 136–145)
TOTAL PROTEIN: 7.6 g/dL (ref 6.4–8.3)

## 2016-01-03 LAB — CBC & DIFF AND RETIC
BASO%: 0.3 % (ref 0.0–2.0)
BASOS ABS: 0 10*3/uL (ref 0.0–0.1)
EOS ABS: 0 10*3/uL (ref 0.0–0.5)
EOS%: 0 % (ref 0.0–7.0)
HCT: 34.8 % (ref 34.8–46.6)
HEMOGLOBIN: 10.6 g/dL — AB (ref 11.6–15.9)
IMMATURE RETIC FRACT: 4.5 % (ref 1.60–10.00)
LYMPH#: 1.1 10*3/uL (ref 0.9–3.3)
LYMPH%: 17.1 % (ref 14.0–49.7)
MCH: 29.7 pg (ref 25.1–34.0)
MCHC: 30.5 g/dL — ABNORMAL LOW (ref 31.5–36.0)
MCV: 97.5 fL (ref 79.5–101.0)
MONO#: 0.5 10*3/uL (ref 0.1–0.9)
MONO%: 8 % (ref 0.0–14.0)
NEUT#: 4.9 10*3/uL (ref 1.5–6.5)
NEUT%: 74.6 % (ref 38.4–76.8)
PLATELETS: 114 10*3/uL — AB (ref 145–400)
RBC: 3.57 10*6/uL — AB (ref 3.70–5.45)
RDW: 16.1 % — ABNORMAL HIGH (ref 11.2–14.5)
RETIC CT ABS: 42.13 10*3/uL (ref 33.70–90.70)
Retic %: 1.18 % (ref 0.70–2.10)
WBC: 6.5 10*3/uL (ref 3.9–10.3)

## 2016-01-03 LAB — FERRITIN: Ferritin: 152 ng/ml (ref 9–269)

## 2016-01-03 MED ORDER — DARBEPOETIN ALFA 100 MCG/0.5ML IJ SOSY
50.0000 ug | PREFILLED_SYRINGE | Freq: Once | INTRAMUSCULAR | Status: AC
  Administered 2016-01-03: 50 ug via SUBCUTANEOUS
  Filled 2016-01-03: qty 0.5

## 2016-01-03 NOTE — Progress Notes (Signed)
Aranesp 50 mg confirmed with Dr Irene Limbo

## 2016-01-03 NOTE — Patient Instructions (Signed)
Darbepoetin Alfa injection What is this medicine? DARBEPOETIN ALFA (dar be POE e tin AL fa) helps your body make more red blood cells. It is used to treat anemia caused by chronic kidney failure and chemotherapy. This medicine may be used for other purposes; ask your health care provider or pharmacist if you have questions. What should I tell my health care provider before I take this medicine? They need to know if you have any of these conditions: -blood clotting disorders or history of blood clots -cancer patient not on chemotherapy -cystic fibrosis -heart disease, such as angina, heart failure, or a history of a heart attack -hemoglobin level of 12 g/dL or greater -high blood pressure -low levels of folate, iron, or vitamin B12 -seizures -an unusual or allergic reaction to darbepoetin, erythropoietin, albumin, hamster proteins, latex, other medicines, foods, dyes, or preservatives -pregnant or trying to get pregnant -breast-feeding How should I use this medicine? This medicine is for injection into a vein or under the skin. It is usually given by a health care professional in a hospital or clinic setting. If you get this medicine at home, you will be taught how to prepare and give this medicine. Do not shake the solution before you withdraw a dose. Use exactly as directed. Take your medicine at regular intervals. Do not take your medicine more often than directed. It is important that you put your used needles and syringes in a special sharps container. Do not put them in a trash can. If you do not have a sharps container, call your pharmacist or healthcare provider to get one. Talk to your pediatrician regarding the use of this medicine in children. While this medicine may be used in children as young as 1 year for selected conditions, precautions do apply. Overdosage: If you think you have taken too much of this medicine contact a poison control center or emergency room at once. NOTE:  This medicine is only for you. Do not share this medicine with others. What if I miss a dose? If you miss a dose, take it as soon as you can. If it is almost time for your next dose, take only that dose. Do not take double or extra doses. What may interact with this medicine? Do not take this medicine with any of the following medications: -epoetin alfa This list may not describe all possible interactions. Give your health care provider a list of all the medicines, herbs, non-prescription drugs, or dietary supplements you use. Also tell them if you smoke, drink alcohol, or use illegal drugs. Some items may interact with your medicine. What should I watch for while using this medicine? Visit your prescriber or health care professional for regular checks on your progress and for the needed blood tests and blood pressure measurements. It is especially important for the doctor to make sure your hemoglobin level is in the desired range, to limit the risk of potential side effects and to give you the best benefit. Keep all appointments for any recommended tests. Check your blood pressure as directed. Ask your doctor what your blood pressure should be and when you should contact him or her. As your body makes more red blood cells, you may need to take iron, folic acid, or vitamin B supplements. Ask your doctor or health care provider which products are right for you. If you have kidney disease continue dietary restrictions, even though this medication can make you feel better. Talk with your doctor or health care professional about the   foods you eat and the vitamins that you take. What side effects may I notice from receiving this medicine? Side effects that you should report to your doctor or health care professional as soon as possible: -allergic reactions like skin rash, itching or hives, swelling of the face, lips, or tongue -breathing problems -changes in vision -chest pain -confusion, trouble speaking  or understanding -feeling faint or lightheaded, falls -high blood pressure -muscle aches or pains -pain, swelling, warmth in the leg -rapid weight gain -severe headaches -sudden numbness or weakness of the face, arm or leg -trouble walking, dizziness, loss of balance or coordination -seizures (convulsions) -swelling of the ankles, feet, hands -unusually weak or tired Side effects that usually do not require medical attention (report to your doctor or health care professional if they continue or are bothersome): -diarrhea -fever, chills (flu-like symptoms) -headaches -nausea, vomiting -redness, stinging, or swelling at site where injected This list may not describe all possible side effects. Call your doctor for medical advice about side effects. You may report side effects to FDA at 1-800-FDA-1088. Where should I keep my medicine? Keep out of the reach of children. Store in a refrigerator between 2 and 8 degrees C (36 and 46 degrees F). Do not freeze. Do not shake. Throw away any unused portion if using a single-dose vial. Throw away any unused medicine after the expiration date. NOTE: This sheet is a summary. It may not cover all possible information. If you have questions about this medicine, talk to your doctor, pharmacist, or health care provider.    2016, [employer]vier/[employer]. (2008-07-06 10:23:57)  

## 2016-01-23 ENCOUNTER — Encounter (HOSPITAL_COMMUNITY): Payer: Self-pay

## 2016-01-23 ENCOUNTER — Ambulatory Visit (HOSPITAL_COMMUNITY)
Admission: RE | Admit: 2016-01-23 | Discharge: 2016-01-23 | Disposition: A | Discharge status: Home / Self Care | Payer: Medicare Other | Source: Ambulatory Visit | Attending: Cardiology | Admitting: Cardiology

## 2016-01-23 VITALS — BP 122/78 | HR 65 | Wt 213.0 lb

## 2016-01-23 DIAGNOSIS — I272 Other secondary pulmonary hypertension: Secondary | ICD-10-CM | POA: Diagnosis not present

## 2016-01-23 DIAGNOSIS — Z7901 Long term (current) use of anticoagulants: Secondary | ICD-10-CM | POA: Insufficient documentation

## 2016-01-23 DIAGNOSIS — E669 Obesity, unspecified: Secondary | ICD-10-CM | POA: Insufficient documentation

## 2016-01-23 DIAGNOSIS — Z841 Family history of disorders of kidney and ureter: Secondary | ICD-10-CM | POA: Insufficient documentation

## 2016-01-23 DIAGNOSIS — J449 Chronic obstructive pulmonary disease, unspecified: Secondary | ICD-10-CM | POA: Insufficient documentation

## 2016-01-23 DIAGNOSIS — Z87891 Personal history of nicotine dependence: Secondary | ICD-10-CM | POA: Insufficient documentation

## 2016-01-23 DIAGNOSIS — J9610 Chronic respiratory failure, unspecified whether with hypoxia or hypercapnia: Secondary | ICD-10-CM | POA: Diagnosis not present

## 2016-01-23 DIAGNOSIS — Z6836 Body mass index (BMI) 36.0-36.9, adult: Secondary | ICD-10-CM | POA: Diagnosis not present

## 2016-01-23 DIAGNOSIS — Z79899 Other long term (current) drug therapy: Secondary | ICD-10-CM | POA: Insufficient documentation

## 2016-01-23 DIAGNOSIS — N183 Chronic kidney disease, stage 3 (moderate): Secondary | ICD-10-CM | POA: Insufficient documentation

## 2016-01-23 DIAGNOSIS — Z9981 Dependence on supplemental oxygen: Secondary | ICD-10-CM | POA: Insufficient documentation

## 2016-01-23 DIAGNOSIS — I481 Persistent atrial fibrillation: Secondary | ICD-10-CM

## 2016-01-23 DIAGNOSIS — M069 Rheumatoid arthritis, unspecified: Secondary | ICD-10-CM | POA: Diagnosis not present

## 2016-01-23 DIAGNOSIS — G4733 Obstructive sleep apnea (adult) (pediatric): Secondary | ICD-10-CM | POA: Insufficient documentation

## 2016-01-23 DIAGNOSIS — I340 Nonrheumatic mitral (valve) insufficiency: Secondary | ICD-10-CM | POA: Insufficient documentation

## 2016-01-23 DIAGNOSIS — I5032 Chronic diastolic (congestive) heart failure: Secondary | ICD-10-CM

## 2016-01-23 DIAGNOSIS — D649 Anemia, unspecified: Secondary | ICD-10-CM | POA: Diagnosis not present

## 2016-01-23 DIAGNOSIS — Z833 Family history of diabetes mellitus: Secondary | ICD-10-CM | POA: Diagnosis not present

## 2016-01-23 DIAGNOSIS — I4819 Other persistent atrial fibrillation: Secondary | ICD-10-CM

## 2016-01-23 DIAGNOSIS — Z8249 Family history of ischemic heart disease and other diseases of the circulatory system: Secondary | ICD-10-CM | POA: Insufficient documentation

## 2016-01-23 LAB — BASIC METABOLIC PANEL
Anion gap: 8 (ref 5–15)
BUN: 30 mg/dL — AB (ref 6–20)
CHLORIDE: 105 mmol/L (ref 101–111)
CO2: 28 mmol/L (ref 22–32)
Calcium: 10.2 mg/dL (ref 8.9–10.3)
Creatinine, Ser: 2.06 mg/dL — ABNORMAL HIGH (ref 0.44–1.00)
GFR calc Af Amer: 27 mL/min — ABNORMAL LOW (ref >60)
GFR, EST NON AFRICAN AMERICAN: 24 mL/min — AB (ref >60)
GLUCOSE: 135 mg/dL — AB (ref 65–99)
POTASSIUM: 3.5 mmol/L (ref 3.5–5.1)
Sodium: 141 mmol/L (ref 135–145)

## 2016-01-23 LAB — BRAIN NATRIURETIC PEPTIDE: B Natriuretic Peptide: 239.7 pg/mL — ABNORMAL HIGH (ref 0.0–100.0)

## 2016-01-23 NOTE — Progress Notes (Signed)
Advanced Mueller Failure Medication Review by a Pharmacist  Does the patient  feel that his/her medications are working for him/her?  yes  Has the patient been experiencing any side effects to the medications prescribed?  no  Does the patient measure his/her own blood pressure or blood glucose at home?  no   Does the patient have any problems obtaining medications due to transportation or finances?   Yes, eliquis copay is very expensive. Providing her refills.   Understanding of regimen: good Understanding of indications: good Potential of compliance: good Patient understands to avoid NSAIDs. Patient understands to avoid decongestants.   Pharmacist comments: April Mueller is a pleasant 63 yof presenting to the clinic for follow-up. She denies any medication-related symptoms. She has been experiencing some dizziness when walking but denies any swelling or SOB. She had a recent nose bleed while on Eliquis (this occurred prior to her being started on Eliquis as well). She has had trouble affording her eliquis copay. Will provide her with samples today. Working to obtain co-pay coverage if patient has spent 3% of yearly salary on medications. Awaiting print out from the pharmacy to evaluate. She did not have any other medication-related questions at this time.   Medication Samples have been provided to the patient.  Drug name: Eliquis      Strength: 5mg         Qty: 28 tablets  LOT: [id number] Exp.Date: 09/19  Dosing instructions: Take 1 tablet by mouth twice daily   The patient has been instructed regarding the correct time, dose, and frequency of taking this medication, including desired effects and most common side effects.   April Mueller C. Lennox Grumbles, PharmD Pharmacy Resident  Pager: [phone] 01/23/2016 9:39 AM   Time with patient: 15 min  Preparation and documentation time: 10 min  Total time: 25 min

## 2016-01-23 NOTE — Progress Notes (Signed)
Patient ID: April Mueller, female   DOB: 1945-10-19, 70 y.o.   MRN: [id number]   HPI  PCP: Dr Harrington Challenger Pulmonary: Dr Byrum/Dr Melvyn Novas   [name] is a 70 yo with following PMHx  1) COPD       --former smoker (28 pk-yrs)       --on home O2 - 4L/min rest, up to 5L/min exertion.        --spirometry 6/13 in [place] = 0.85L FVC = 1.3L no DLCO measured. FEV1 in 2014 = 0.73        --CT chest 10/14 - moderate centrilobular emphysema. No pulm fibrosis but there was evidence for post-infectious/inflammatory scarring. Stable Asc Ao repair       --PFTs (11/14) FVC 46%, FEV1 0.8L (40%), ratio 87%, FEF 25-75% 0.44L DLCO 22%, TLC 50% => PFTs in 06/2013 did NOT suggest significant airflow obstruction according to Dr Gustavus Bryant last note despite "moderate emphysema" on CT.        --PFTs (6/16) were abnormal with severe obstruction, severe restriction, severely decreased DLCO.  Reviewed with Dr Melvyn Novas => he thinks obstruction is actually fairly minimal and that the restriction is due to body habitus.   2) Obesity 3) RA       --on plaquenil, sees Dr Lenna Gilford.  4) Type A aortic dissection s/p repair 2005     --c/b renal failure requiring HD x 3 months 5) PAH - previously treated in [place].  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      --on macitentan (revatio stopped 10/14).     --V/Q scan 11/14 no chronic PE     -- 2015 Tracleer stopped and started on macitentan. 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      -- RHC 08/18/2014: No role for pulmonary vasodilators.  RA = 14 RV = 62/3/15 PA = 63/21 (38) PCW = mean 23 v waves to 40 Fick cardiac output/index = 6.3/3.2 Thermodilution CO/CI = 6.1/3.1, PVR = 2.0 WU, FA sat = 91% PA sat = 57%, 62%     -- RHC 3/17: mean RA 13, PA 44/2 mean 29, mean PCWP 17, CI 3.04, PVR 1.9 WU => pulmonary venous hypertension. 6) OSA 7) Diastolic CHF    --ECHO [id number] EF 55-60% RV mildly dilated with normal function. Mild to moderate posterior MR. RVSP 56. Probable restrictive  diastolic filling pattern    --ECHO 9/15 EF 60-65% RV mildly dilated with normal function. Mild to moderate posterior MR. RVSP 40. Ao Root ok    --TEE 6/16 with EF 55-60%, D-shaped interventricular septum suggestive of RV pressure/volume overload, RV mildly dilated with normal systolic function, peak RV-RA gradient 57 mmHg, very eccentric posteriorly-directed mitral regurgitation, possibly severe, etiology may be a very small area of prolapse on the anterior leaflet, s/p ascending aorta repair with residual dissection flap in the descending thoracic aorta.       --Echo (1/17) with EF 55-60%, mild to moderate MR, PASP 66 8) CKD 9) Mitral regurgitation: Possibly severe by 6/16 TEE, very eccentric.  Not good candidate for percutaneous MV clip (seen at [facility]).  Echo (1/17) with only mild to moderate MR.  10) Anemia: Suspect from chronic disease/renal disease but has been found to be FOBT+.  EGD/colonoscopy/capsule endoscopy in 1/17 did not show any definite site for blood loss.  Now with concern for possible myelodysplastic syndrome.  11) Atrial fibrillation: Persistent, 1st noted in 1/17.   Moved back from [place] and saw Dr. Lamonte Sakai. In [place] was  followed by cardiology and pulmonary. Started on Tracleer in 2006. Revatio was added, however was discontinued without any change in her symptoms. Stopped smoking in January 2005 when she had aortic aneurysm repair. Smoked 1.5-2 ppd x 15 - 20 years.  Now on macitentan.  I had her do a TEE in 6/16.  She had a loud MR murmur.  TEE showed very eccentric, posteriorly-directed MR that may be from subtle prolapse of the anterior mitral leaflet.  I am concerned that the MR could be severe but very difficult to fully visualize.  She additionally had PFTs done that were abnormal, but according to her pulmonologist Melvyn Novas), the obstruction was really only minimal and the restriction was primarily related to body habitus.  He does not think that we can explain her hypoxemia and dyspnea  primarily from parenchymal lung pathology.  I sent her to [facility] for evaluation for percutaneous mitral valve clip.  They did not think that she was a good candidate.   She has been anemic and received 1 unit PRBCs in 8/16.  She saw GI and was noted to be heme negative, no scopes were done (heme negative and high risk). She has had iron infusions.   Opsumit was stopped in 9/16 due to suspicion for group 2 pulmonary hypertension only (low PVR) and she felt better off it.   She was admitted in 1/17 with increased dyspnea and was found to be profoundly anemic.  She had a total of 5 units PRBCs and an iron infusion in the hospital.  Stool was FOBT+, melena.  EGD, colonoscopy, and capsule endoscopy were all done without clear source of blood loss.  While in the hospital, she was noted to go into atrial fibrillation with RVR.  She initially required amiodarone for rate control, but was transitioned over to Toprol XL. She was not anticoagulated due to concern for GI blood loss and anemia.    RHC in 3/17 showed elevated right heart filling pressures and mildly elevated PCWP.  There was mild pulmonary hypertension, likely pulmonary venous hypertension with PVR 1.9 WU.   She returns for followup today.  She remains in atrial fibrillation.  Weight is down 1 lb. She remains short of breath after walking about 200 feet; this is stable.  Overall, feels ok. No BRBPR or melena.  No chest pain.  Stable orthopnea.  Using CPAP at night and oxygen during the day. She was seen by Dr Rayann Heman, he recommended against Watchman. She is now on Eliquis with no evidence for GI bleeding so far.  She did have significant epistaxis recently but is now using vaseline and saline nasal spray without recurrence.   ECG: atrial fibrillation   Labs 02/05/13 K 3.8 Creatinine 1.37 05/08/13 K 3.8, Creatinine 1.58 11/14 K 4.1, creatinine 1.82 08/09/2014: K 4.0 Creatinine 1.45  5/16: K 3.7, creatinine 1.76, BNP 447 6/16: K 5 => 4.6, creatinine 2.09  => 2.13, BNP 291 8/16: K 4.7, creatinine 2.1, HCT 30.7 9/16: K 4.5, creatinine 2.3, HCT 34.1 1/17: K 4.8, creatinine 1.84 => 1.87, HCT 29.7, plts 136, FOBT+, BNP 403 2/17: K 3.5, creatinine 1.89, BNP 329, hgb 10 3/17: K 3.8, creatinine 2.5 => 2.3, HCT 35.4 => 33.5 5/17: K 3.7, creatinine 2.0, hgb 10.6  ROS: All systems reviewed and negative except as per HPI.  Current Outpatient Prescriptions on File Prior to Encounter  Medication Sig Dispense Refill  . acetaminophen (TYLENOL) 325 MG tablet Take 2 tablets (650 mg total) by mouth every 6 (six)  hours as needed for mild pain (or Fever >/= 101).    [name] allopurinol (ZYLOPRIM) 300 MG tablet Take 300 mg by mouth daily.    [name] ALPRAZolam (XANAX) 0.25 MG tablet Take 0.25 mg by mouth 2 (two) times daily as needed for anxiety or sleep.     [name] apixaban (ELIQUIS) 5 MG TABS tablet Take 1 tablet (5 mg total) by mouth 2 (two) times daily. 180 tablet 3  . atorvastatin (LIPITOR) 10 MG tablet Take 1 tablet (10 mg total) by mouth daily. 90 tablet 3  . Biotin 1 MG CAPS Take 1 capsule by mouth daily.    . hydroxychloroquine (PLAQUENIL) 200 MG tablet Take 200 mg by mouth 2 (two) times daily.    . metoprolol succinate (TOPROL-XL) 25 MG 24 hr tablet Take 1 tablet (25 mg total) by mouth 2 (two) times daily. 180 tablet 1  . Omega-3 Fatty Acids (FISH OIL) 1200 MG CAPS Take 1,200 mg by mouth daily.    . OXYGEN Inhale 3-6 L into the lungs continuous.     . potassium chloride SA (KLOR-CON M20) 20 MEQ tablet Take 1 tablet (20 mEq total) by mouth daily. 30 tablet 6  . torsemide (DEMADEX) 20 MG tablet Take 2 tablets (40 mg total) by mouth 2 (two) times daily. 360 tablet 3  . b complex vitamins tablet Take 1 tablet by mouth daily. Reported on 01/23/2016    . ketoconazole (NIZORAL) 2 % shampoo Apply 1 application topically as needed for irritation. Reported on 01/23/2016  3  . umeclidinium-vilanterol (ANORO ELLIPTA) 62.5-25 MCG/INH AEPB Inhale 1 puff into the lungs daily. Reported  on 01/23/2016     No current facility-administered medications on file prior to encounter.   Social History   Social History  . Marital Status: Legally Separated    Spouse Name: N/A  . Number of Children: 1  . Years of Education: N/A   Occupational History  . Disability     Office Work   Social History Main Topics  . Smoking status: Former Smoker -- 1.50 packs/day for 14 years    Types: Cigarettes    Quit date: 08/09/2003  . Smokeless tobacco: Never Used  . Alcohol Use: No  . Drug Use: No  . Sexual Activity: Not Currently   Other Topics Concern  . None   Social History Narrative   Family History  Problem Relation Age of Onset  . Heart attack Mother   . Prostate cancer Father   . Diabetes Brother   . Kidney failure Brother     Filed Vitals:   01/23/16 0904  BP: 122/78  Pulse: 65  Weight: 213 lb (96.616 kg)  SpO2: 96%   Physical Exam Gen: Pleasant, well-nourished, in no distress, normal affect; on O2 by [place].  HEENT: normal Neck: JVP 8 cm. Lungs: CTAB  Cardiovascular: PMI normal. Regular S1S2, no S3/S4, 2/6 HSM LLSB/apex. Trace ankle edema. Extremities: No deformities, no cyanosis or clubbing.  Neuro: alert, non focal. Cranial nerves intact. Moves all 4 extremities without difficulty Skin: Warm, no lesions or rashes  Assessment/Plan: 1. 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  This appeared to be primarily pulmonary venous hypertension.  Interestingly, there were very prominent V waves in the PCWP tracing. On exam, she has a prominent MR murmur.  I was concerned that significant MR may be playing a major role in her symptomatology. I did  a TEE in 6/16 showing very eccentric, posteriorly-directed mitral regurgitation possibly from subtle anterior leaflet prolapse.  I think that the MR could be severe but is difficult to fully visualize.   She has significant COPD to help explain her hypoxemia and dyspnea. She was seen at [facility] for consideration of percutaneous MV clip and was not thought to be a good candidate => concern that MV disease may not actually be severe and concern that her dyspnea is coming from COPD and would not be improved by MV clipping.  Most recent echo in 1/17 showed normal LV EF and only visualized mild to moderate MR.  She became volume overloaded in the setting of the onset of persistent atrial fibrillation.  At a prior appointment, Lasix was stopped and torsemide was started.  Volume status is improved overall.  NYHA class III symptoms, stable.  Last RHC in 3/17 showed mildly elevated filling pressures and pulmonary venous hypertension (mild).  - Continue torsemide 40 mg bid, check BMET/BNP today.  2. Chronic respiratory failure:  She is on 4-5 L home oxygen by [place]. She has COPD and restriction from her body habitus.  3. OSA: Continue CPAP nightly.  4. CKD: Stage III-IV.  Repeat BMET, stable recently. 5. COPD: See #2 above, per pulmonary.     6. Aortic dissection s/p repair 2005: Although complete records are not available, it appears that the patient developed a Type A dissection in 2005 with ascending aorta repair (can see the surgically repaired ascending aorta well on TEE).  She has residual dissection in the descending thoracic aorta.  7. Pulmonary hypertension: This is primarily [place] group 2 (pulmonary venous hypertension) based on RHC in 3/17.  She is not a good candidate for pulmonary vasodilators.   8. Mitral regurgitation:  TEE concerning for possibly severe, eccentric mitral regurgitation, most likely due to subtle prolapse of the anterior mitral valve leaflet.  I have been concerned that the MR is playing a role in her CHF. She had prominent V-waves on RHC in 1/16, and she has a loud mitral area murmur.  I do not think that she is a good candidate for open MV surgery. I had her evaluated at [facility] for possible  percutaneous mitral clipping.  She was turned down for this as her MR was not clearly severe and fixing it may not help her hypoxemia appreciably.  Of note, most recent echo in 1/17 actually visualized only mild to moderate MR.   9. Anemia: Suspect anemia of chronic disease/renal disease but also with component of GI blood loss.  She had an episode of GI bleeding with FOBT+, but unable to visualized bleeding source on EGD, colonoscopy, or capsule endoscopy.  Seeing hematology, getting Aranesp. Some concern now for possible myelodysplastic syndrome.  10. Atrial fibrillation: Persistent.  Rate is controlled.  CHADSVASC = 3.  She was evaluated for Watchman but decided not to be a good candidate. She is now on Eliquis, will need to follow closely for evidence of GI bleeding.     Followup in 2 months   [name] 01/23/2016

## 2016-01-23 NOTE — Patient Instructions (Signed)
EKG today.  Routine lab work today. Will notify you of abnormal results, otherwise no news is good news!  Follow up 2 months with Dr. Aundra Dubin.  Do the following things EVERYDAY: 1) Weigh yourself in the morning before breakfast. Write it down and keep it in a log. 2) Take your medicines as prescribed 3) Eat low salt foods-Limit salt (sodium) to 2000 mg per day.  4) Stay as active as you can everyday 5) Limit all fluids for the day to less than 2 liters

## 2016-01-30 ENCOUNTER — Ambulatory Visit: Payer: Medicare Other

## 2016-01-30 ENCOUNTER — Other Ambulatory Visit (HOSPITAL_BASED_OUTPATIENT_CLINIC_OR_DEPARTMENT_OTHER): Payer: Medicare Other

## 2016-01-30 ENCOUNTER — Telehealth: Payer: Self-pay | Admitting: Hematology

## 2016-01-30 ENCOUNTER — Encounter: Payer: Self-pay | Admitting: Hematology

## 2016-01-30 ENCOUNTER — Ambulatory Visit (HOSPITAL_BASED_OUTPATIENT_CLINIC_OR_DEPARTMENT_OTHER): Payer: Medicare Other | Admitting: Hematology

## 2016-01-30 VITALS — BP 101/40 | HR 49 | Temp 98.7°F | Resp 18 | Ht 64.0 in | Wt 208.7 lb

## 2016-01-30 DIAGNOSIS — D631 Anemia in chronic kidney disease: Secondary | ICD-10-CM

## 2016-01-30 DIAGNOSIS — I272 Other secondary pulmonary hypertension: Secondary | ICD-10-CM

## 2016-01-30 DIAGNOSIS — N189 Chronic kidney disease, unspecified: Secondary | ICD-10-CM

## 2016-01-30 DIAGNOSIS — I509 Heart failure, unspecified: Secondary | ICD-10-CM

## 2016-01-30 DIAGNOSIS — D469 Myelodysplastic syndrome, unspecified: Secondary | ICD-10-CM

## 2016-01-30 DIAGNOSIS — D500 Iron deficiency anemia secondary to blood loss (chronic): Secondary | ICD-10-CM

## 2016-01-30 DIAGNOSIS — M069 Rheumatoid arthritis, unspecified: Secondary | ICD-10-CM

## 2016-01-30 DIAGNOSIS — D696 Thrombocytopenia, unspecified: Secondary | ICD-10-CM

## 2016-01-30 DIAGNOSIS — D638 Anemia in other chronic diseases classified elsewhere: Secondary | ICD-10-CM

## 2016-01-30 LAB — CBC & DIFF AND RETIC
BASO%: 0.4 % (ref 0.0–2.0)
Basophils Absolute: 0 10*3/uL (ref 0.0–0.1)
EOS%: 1.2 % (ref 0.0–7.0)
Eosinophils Absolute: 0.1 10*3/uL (ref 0.0–0.5)
HCT: 28.3 % — ABNORMAL LOW (ref 34.8–46.6)
HGB: 8.6 g/dL — ABNORMAL LOW (ref 11.6–15.9)
Immature Retic Fract: 5.5 % (ref 1.60–10.00)
LYMPH#: 0.9 10*3/uL (ref 0.9–3.3)
LYMPH%: 13.4 % — AB (ref 14.0–49.7)
MCH: 29.8 pg (ref 25.1–34.0)
MCHC: 30.4 g/dL — AB (ref 31.5–36.0)
MCV: 97.9 fL (ref 79.5–101.0)
MONO#: 0.4 10*3/uL (ref 0.1–0.9)
MONO%: 6.5 % (ref 0.0–14.0)
NEUT#: 5.3 10*3/uL (ref 1.5–6.5)
NEUT%: 78.5 % — AB (ref 38.4–76.8)
PLATELETS: 107 10*3/uL — AB (ref 145–400)
RBC: 2.89 10*6/uL — AB (ref 3.70–5.45)
RDW: 15.8 % — ABNORMAL HIGH (ref 11.2–14.5)
RETIC CT ABS: 61.85 10*3/uL (ref 33.70–90.70)
Retic %: 2.14 % — ABNORMAL HIGH (ref 0.70–2.10)
WBC: 6.7 10*3/uL (ref 3.9–10.3)

## 2016-01-30 LAB — COMPREHENSIVE METABOLIC PANEL
ALT: 11 U/L (ref 0–55)
ANION GAP: 9 meq/L (ref 3–11)
AST: 15 U/L (ref 5–34)
Albumin: 3.2 g/dL — ABNORMAL LOW (ref 3.5–5.0)
Alkaline Phosphatase: 80 U/L (ref 40–150)
BUN: 45.8 mg/dL — ABNORMAL HIGH (ref 7.0–26.0)
CALCIUM: 10.5 mg/dL — AB (ref 8.4–10.4)
CHLORIDE: 104 meq/L (ref 98–109)
CO2: 30 meq/L — AB (ref 22–29)
Creatinine: 2 mg/dL — ABNORMAL HIGH (ref 0.6–1.1)
EGFR: 28 mL/min/{1.73_m2} — AB (ref >90)
Glucose: 139 mg/dl (ref 70–140)
POTASSIUM: 4 meq/L (ref 3.5–5.1)
Sodium: 143 mEq/L (ref 136–145)
Total Bilirubin: 0.45 mg/dL (ref 0.20–1.20)
Total Protein: 7.2 g/dL (ref 6.4–8.3)

## 2016-01-30 LAB — FERRITIN: FERRITIN: 100 ng/mL (ref 9–269)

## 2016-01-30 MED ORDER — DARBEPOETIN ALFA 100 MCG/0.5ML IJ SOSY
50.0000 ug | PREFILLED_SYRINGE | Freq: Once | INTRAMUSCULAR | Status: AC
  Administered 2016-01-30: 50 ug via SUBCUTANEOUS
  Filled 2016-01-30: qty 0.5

## 2016-01-30 NOTE — Progress Notes (Signed)
Injection given by desk nurse 

## 2016-01-30 NOTE — Progress Notes (Signed)
[name]    HEMATOLOGY/ONCOLOGY CLINIC NOTE  Date of Service: .  Patient Care Team: Lona Kettle, MD as PCP - General (Family Medicine)  CHIEF COMPLAINTS/PURPOSE OF CONSULTATION: f/u for Anemia  Diagnosis: Normocytic Normochromic Anemia likely multifactorial - CKD/RA/GIB/MDS  Treatment: Aranesp + IV feraheme as needed  HISTORY OF PRESENTING ILLNESS: Please see my initial consultation for details on initial presentation.  INTERVAL HISTORY  Ms Cockayne is here for her scheduled follow-up for her anemia. She notes no acute new issues. Reports that she has been started back on Eliquis 5 mg by mouth twice a day by her cardiologist for atrial fibrillation. No significant nosebleeds as previously. Has been using Vaseline swabs and saline nasal spray. Notes that her stools have been brown and she has had no evidence of overt GI bleeding. No other acute new concerns.     MEDICAL HISTORY:  Past Medical History  Diagnosis Date  . Hypertension   . Pulmonary HTN ([place])   . Aortic aneurysm ([place])   . Sleep apnea     wears CPAP  . CKD (chronic kidney disease) stage 3, GFR 30-59 ml/min   . Anemia 03/23/15    transfusion  . Arthritis   . CHF (congestive heart failure) ([place])   . Gout   . Rheumatoid arthritis(714.0) 11/06/2012    SURGICAL HISTORY: Past Surgical History  Procedure Laterality Date  . Abdominal aortic aneurysm repair    . Right heart catheterization N/A 08/18/2014    Procedure: RIGHT HEART CATH;  Surgeon: Jolaine Artist, MD;  Location: [facility] CATH LAB;  Service: Cardiovascular;  Laterality: N/A;  . Tee without cardioversion N/A 01/05/2015    Procedure: TRANSESOPHAGEAL ECHOCARDIOGRAM (TEE);  Surgeon: Larey Dresser, MD;  Location: [place];  Service: Cardiovascular;  Laterality: N/A;  . Wisdom tooth extraction    . Givens capsule study N/A 08/21/2015    Procedure: GIVENS CAPSULE STUDY;  Surgeon: Gatha Mayer, MD;  Location: [place];  Service: Endoscopy;  Laterality: N/A;    . Esophagogastroduodenoscopy N/A 08/19/2015    Procedure: ESOPHAGOGASTRODUODENOSCOPY (EGD);  Surgeon: Jerene Bears, MD;  Location: [facility] ENDOSCOPY;  Service: Endoscopy;  Laterality: N/A;  patient scheduled, anesthesia aware of 1500 case, per [place].  08/18/15 DP  . Colonoscopy N/A 08/19/2015    Procedure: COLONOSCOPY;  Surgeon: Jerene Bears, MD;  Location: [facility] ENDOSCOPY;  Service: Endoscopy;  Laterality: N/A;  . Cardiac catheterization N/A 10/24/2015    Procedure: Right Heart Cath;  Surgeon: Larey Dresser, MD;  Location: [place] CV LAB;  Service: Cardiovascular;  Laterality: N/A;    SOCIAL HISTORY: Social History   Social History  . Marital Status: Legally Separated    Spouse Name: N/A  . Number of Children: 1  . Years of Education: N/A   Occupational History  . Disability     Office Work   Social History Main Topics  . Smoking status: Former Smoker -- 1.50 packs/day for 14 years    Types: Cigarettes    Quit date: 08/09/2003  . Smokeless tobacco: Never Used  . Alcohol Use: No  . Drug Use: No  . Sexual Activity: Not Currently   Other Topics Concern  . Not on file   Social History Narrative    FAMILY HISTORY: Family History  Problem Relation Age of Onset  . Heart attack Mother   . Prostate cancer Father   . Diabetes Brother   . Kidney failure Brother     ALLERGIES:  has No Known Allergies.  MEDICATIONS:  Current Outpatient Prescriptions  Medication Sig Dispense Refill  . acetaminophen (TYLENOL) 325 MG tablet Take 2 tablets (650 mg total) by mouth every 6 (six) hours as needed for mild pain (or Fever >/= 101).    [name] allopurinol (ZYLOPRIM) 300 MG tablet Take 300 mg by mouth daily.    [name] ALPRAZolam (XANAX) 0.25 MG tablet Take 0.25 mg by mouth 2 (two) times daily as needed for anxiety or sleep.     [name] apixaban (ELIQUIS) 5 MG TABS tablet Take 1 tablet (5 mg total) by mouth 2 (two) times daily. 180 tablet 3  . atorvastatin (LIPITOR) 10 MG tablet Take 1 tablet (10 mg total)  by mouth daily. 90 tablet 3  . hydroxychloroquine (PLAQUENIL) 200 MG tablet Take 200 mg by mouth 2 (two) times daily.    [name] ketoconazole (NIZORAL) 2 % shampoo Apply 1 application topically as needed for irritation. Reported on 01/23/2016  3  . metoprolol succinate (TOPROL-XL) 25 MG 24 hr tablet Take 1 tablet (25 mg total) by mouth 2 (two) times daily. 180 tablet 1  . Omega-3 Fatty Acids (FISH OIL) 1200 MG CAPS Take 1,200 mg by mouth daily.    . OXYGEN Inhale 3-6 L into the lungs continuous.     . potassium chloride SA (KLOR-CON M20) 20 MEQ tablet Take 1 tablet (20 mEq total) by mouth daily. 30 tablet 6  . torsemide (DEMADEX) 20 MG tablet Take 2 tablets (40 mg total) by mouth 2 (two) times daily. 360 tablet 3   No current facility-administered medications for this visit.    REVIEW OF SYSTEMS:    10 Point review of Systems was done is negative except as noted above.  PHYSICAL EXAMINATION: ECOG PERFORMANCE STATUS: 3 - Symptomatic, >50% confined to bed  . Filed Vitals:   01/30/16 0930  Height: 5\' 4"  (1.626 m)  Weight: 208 lb 11.2 oz (94.666 kg)   Filed Weights   01/30/16 0930  Weight: 208 lb 11.2 oz (94.666 kg)   .Body mass index is 35.81 kg/(m^2).  GENERAL:alert, in no acute distress and comfortable on [place] oxygen SKIN: skin color, texture, turgor are normal, no rashes or significant lesions EYES: normal, conjunctiva are pink and non-injected, sclera clear OROPHARYNX:no exudate, no erythema and lips, buccal mucosa, and tongue normal  NECK: supple, no JVD, thyroid normal size, non-tender, without nodularity LYMPH:  no palpable lymphadenopathy in the cervical, axillary or inguinal LUNGS: clear to auscultation with normal respiratory effort HEART: regular rate & rhythm,  no murmurs and no lower extremity edema ABDOMEN: abdomen soft, non-tender, normoactive bowel sounds  Musculoskeletal: no cyanosis of digits and no clubbing  PSYCH: alert & oriented x 3 with fluent speech NEURO: no  focal motor/sensory deficits  LABORATORY DATA:  I have reviewed the data as listed  . CBC Latest Ref Rng 01/30/2016 01/03/2016 12/05/2015  WBC 3.9 - 10.3 10e3/uL 6.7 6.5 6.6  Hemoglobin 11.6 - 15.9 g/dL 8.6(L) 10.6(L) 9.8(L)  Hematocrit 34.8 - 46.6 % 28.3(L) 34.8 32.1(L)  Platelets 145 - 400 10e3/uL 107(L) 114(L) 103(L)   . CBC    Component Value Date/Time   WBC 6.7 01/30/2016 0902   WBC 5.0 11/22/2015 1048   RBC 2.89* 01/30/2016 0902   RBC 3.57* 11/22/2015 1048   RBC 1.48* 08/16/2015 1258   HGB 8.6* 01/30/2016 0902   HGB 10.3* 11/22/2015 1048   HCT 28.3* 01/30/2016 0902   HCT 35.0* 11/22/2015 1048   PLT 107* 01/30/2016 0902   PLT 109* 11/22/2015  1048   MCV 97.9 01/30/2016 0902   MCV 98.0 11/22/2015 1048   MCH 29.8 01/30/2016 0902   MCH 28.9 11/22/2015 1048   MCHC 30.4* 01/30/2016 0902   MCHC 29.4* 11/22/2015 1048   RDW 15.8* 01/30/2016 0902   RDW 16.0* 11/22/2015 1048   LYMPHSABS 0.9 01/30/2016 0902   LYMPHSABS 1.0 08/23/2015 0337   MONOABS 0.4 01/30/2016 0902   MONOABS 1.1* 08/23/2015 0337   EOSABS 0.1 01/30/2016 0902   EOSABS 0.3 08/23/2015 0337   BASOSABS 0.0 01/30/2016 0902   BASOSABS 0.0 08/23/2015 0337      . CMP Latest Ref Rng 01/30/2016 01/23/2016 01/03/2016  Glucose 70 - 140 mg/dl 139 135(H) 116  BUN 7.0 - 26.0 mg/dL 45.8(H) 30(H) 46.0(H)  Creatinine 0.6 - 1.1 mg/dL 2.0(H) 2.06(H) 2.0(H)  Sodium 136 - 145 mEq/L 143 141 143  Potassium 3.5 - 5.1 mEq/L 4.0 3.5 3.7  Chloride 101 - 111 mmol/L - 105 -  CO2 22 - 29 mEq/L 30(H) 28 31(H)  Calcium 8.4 - 10.4 mg/dL 10.5(H) 10.2 10.7(H)  Total Protein 6.4 - 8.3 g/dL 7.2 - 7.6  Total Bilirubin 0.20 - 1.20 mg/dL 0.45 - 0.41  Alkaline Phos 40 - 150 U/L 80 - 86  AST 5 - 34 U/L 15 - 16  ALT 0 - 55 U/L 11 - 11   .  Lab Results  Component Value Date   FERRITIN 152 01/03/2016   Ferritin levels from today are currently pending.  RADIOGRAPHIC STUDIES: I have personally reviewed the radiological images as listed  and agreed with the findings in the report. No results found.  ASSESSMENT & PLAN:   70 year old female with multiple medical comorbidities including hypertension, COPD, CHF, gout, rheumatoid arthritis, moderate mitral regurgitation with  #1 Normocytic Normochromic anemia.-Her anemia is likely multifactorial from her chronic kidney disease plus rheumatoid arthritis. Her allopurinol and plaquenil could certainly be an additional factors especially if there is worsening renal function. 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 SPEP with no monoclonal protein. No clinical evidence of splenomegaly to suggest overt hypersplenism. Hemoglobin stable at 8.6.  We'll need to monitor closely since the patient is back on Elquis since mid-May as per her cardiologist. Previously developed Acute on Chronic Anemia due to GI bleeding requiring 5 units of PRBCs and IV feraheme in the hospital. #2 Mild thrombocytopenia -stable  #2 rheumatoid arthritis #3 chronic kidney disease #4 pulmonary hypertension #5 CHF  Plan -hgb today is stable at 8.6 -Patient may proceed for her monthly Aranesp today. -We shall follow up on her ferritin labs from today and set her up for additional IV Feraheme to maintain ferritin more than 100. -Might need to increase dose as needed to target hemoglobin around 10-11 and hold for hemoglobin more than 11.  -Continue optimal treatment of rheumatoid arthritis -Continue follow-up with primary care physician for other medical cares. -We'll recheck myeloma panel.  Return to care in 1 month for her next Aranesp shot and monthly labs Return to care with Dr. Irene Limbo in 2 month and monthly labs  CBC, CMP and ferritin  All of the patients questions were answered to her apparent satisfaction. The patient knows to call the clinic with any problems, questions or concerns.  I spent 15 minutes counseling the patient face to face. The total time spent  in the appointment was 20 minutes and more than 50% was on counseling and direct patient cares and reviewed her extensive inpatient records and results  Sullivan Lone MD [place] AAHIVMS [facility] Hematology/Oncology Physician [facility]  (Office):       [phone] (Work cell):  [phone] (Fax):           [phone]

## 2016-01-30 NOTE — Telephone Encounter (Signed)
Gave and printed appt sched and avs for pt for July and Aug °

## 2016-02-20 ENCOUNTER — Encounter (HOSPITAL_COMMUNITY): Payer: Self-pay | Admitting: Emergency Medicine

## 2016-02-20 ENCOUNTER — Emergency Department (HOSPITAL_COMMUNITY): Payer: Medicare Other

## 2016-02-20 ENCOUNTER — Observation Stay (HOSPITAL_COMMUNITY)
Admission: EM | Admit: 2016-02-20 | Discharge: 2016-02-22 | Disposition: A | Discharge status: Home / Self Care | Payer: Medicare Other | Attending: Internal Medicine | Admitting: Internal Medicine

## 2016-02-20 DIAGNOSIS — I481 Persistent atrial fibrillation: Secondary | ICD-10-CM | POA: Insufficient documentation

## 2016-02-20 DIAGNOSIS — M329 Systemic lupus erythematosus, unspecified: Secondary | ICD-10-CM | POA: Insufficient documentation

## 2016-02-20 DIAGNOSIS — J9621 Acute and chronic respiratory failure with hypoxia: Secondary | ICD-10-CM | POA: Diagnosis not present

## 2016-02-20 DIAGNOSIS — I340 Nonrheumatic mitral (valve) insufficiency: Secondary | ICD-10-CM | POA: Insufficient documentation

## 2016-02-20 DIAGNOSIS — F419 Anxiety disorder, unspecified: Secondary | ICD-10-CM | POA: Diagnosis not present

## 2016-02-20 DIAGNOSIS — I10 Essential (primary) hypertension: Secondary | ICD-10-CM | POA: Diagnosis present

## 2016-02-20 DIAGNOSIS — D469 Myelodysplastic syndrome, unspecified: Secondary | ICD-10-CM | POA: Diagnosis not present

## 2016-02-20 DIAGNOSIS — D631 Anemia in chronic kidney disease: Principal | ICD-10-CM | POA: Insufficient documentation

## 2016-02-20 DIAGNOSIS — Z8719 Personal history of other diseases of the digestive system: Secondary | ICD-10-CM | POA: Diagnosis not present

## 2016-02-20 DIAGNOSIS — M109 Gout, unspecified: Secondary | ICD-10-CM | POA: Insufficient documentation

## 2016-02-20 DIAGNOSIS — M069 Rheumatoid arthritis, unspecified: Secondary | ICD-10-CM | POA: Diagnosis not present

## 2016-02-20 DIAGNOSIS — I130 Hypertensive heart and chronic kidney disease with heart failure and stage 1 through stage 4 chronic kidney disease, or unspecified chronic kidney disease: Secondary | ICD-10-CM | POA: Diagnosis not present

## 2016-02-20 DIAGNOSIS — N183 Chronic kidney disease, stage 3 unspecified: Secondary | ICD-10-CM | POA: Diagnosis present

## 2016-02-20 DIAGNOSIS — K921 Melena: Secondary | ICD-10-CM | POA: Diagnosis not present

## 2016-02-20 DIAGNOSIS — I272 Other secondary pulmonary hypertension: Secondary | ICD-10-CM | POA: Diagnosis not present

## 2016-02-20 DIAGNOSIS — D638 Anemia in other chronic diseases classified elsewhere: Secondary | ICD-10-CM | POA: Diagnosis present

## 2016-02-20 DIAGNOSIS — Z7901 Long term (current) use of anticoagulants: Secondary | ICD-10-CM | POA: Diagnosis not present

## 2016-02-20 DIAGNOSIS — Z6835 Body mass index (BMI) 35.0-35.9, adult: Secondary | ICD-10-CM | POA: Insufficient documentation

## 2016-02-20 DIAGNOSIS — J9611 Chronic respiratory failure with hypoxia: Secondary | ICD-10-CM | POA: Diagnosis present

## 2016-02-20 DIAGNOSIS — Z86010 Personal history of colonic polyps: Secondary | ICD-10-CM | POA: Diagnosis not present

## 2016-02-20 DIAGNOSIS — J449 Chronic obstructive pulmonary disease, unspecified: Secondary | ICD-10-CM | POA: Insufficient documentation

## 2016-02-20 DIAGNOSIS — I5032 Chronic diastolic (congestive) heart failure: Secondary | ICD-10-CM | POA: Insufficient documentation

## 2016-02-20 DIAGNOSIS — I482 Chronic atrial fibrillation: Secondary | ICD-10-CM | POA: Diagnosis not present

## 2016-02-20 DIAGNOSIS — Z9989 Dependence on other enabling machines and devices: Secondary | ICD-10-CM

## 2016-02-20 DIAGNOSIS — Z87891 Personal history of nicotine dependence: Secondary | ICD-10-CM | POA: Insufficient documentation

## 2016-02-20 DIAGNOSIS — I739 Peripheral vascular disease, unspecified: Secondary | ICD-10-CM | POA: Diagnosis not present

## 2016-02-20 DIAGNOSIS — Z9981 Dependence on supplemental oxygen: Secondary | ICD-10-CM | POA: Insufficient documentation

## 2016-02-20 DIAGNOSIS — D649 Anemia, unspecified: Secondary | ICD-10-CM

## 2016-02-20 DIAGNOSIS — I2721 Secondary pulmonary arterial hypertension: Secondary | ICD-10-CM | POA: Diagnosis present

## 2016-02-20 DIAGNOSIS — I4819 Other persistent atrial fibrillation: Secondary | ICD-10-CM | POA: Diagnosis present

## 2016-02-20 DIAGNOSIS — G4733 Obstructive sleep apnea (adult) (pediatric): Secondary | ICD-10-CM | POA: Insufficient documentation

## 2016-02-20 DIAGNOSIS — D500 Iron deficiency anemia secondary to blood loss (chronic): Secondary | ICD-10-CM | POA: Diagnosis not present

## 2016-02-20 DIAGNOSIS — R69 Illness, unspecified: Secondary | ICD-10-CM

## 2016-02-20 DIAGNOSIS — M1389 Other specified arthritis, multiple sites: Secondary | ICD-10-CM | POA: Diagnosis not present

## 2016-02-20 HISTORY — DX: Personal history of other medical treatment: Z92.89

## 2016-02-20 HISTORY — DX: Other chronic pain: G89.29

## 2016-02-20 HISTORY — DX: Dependence on other enabling machines and devices: Z99.89

## 2016-02-20 HISTORY — DX: Reserved for concepts with insufficient information to code with codable children: IMO0002

## 2016-02-20 HISTORY — DX: Low back pain: M54.5

## 2016-02-20 HISTORY — DX: Chronic obstructive pulmonary disease, unspecified: J44.9

## 2016-02-20 HISTORY — DX: Respiratory tuberculosis unspecified: A15.9

## 2016-02-20 HISTORY — DX: Systemic lupus erythematosus, unspecified: M32.9

## 2016-02-20 HISTORY — DX: Low back pain, unspecified: M54.50

## 2016-02-20 HISTORY — DX: Obstructive sleep apnea (adult) (pediatric): G47.33

## 2016-02-20 HISTORY — DX: Dependence on supplemental oxygen: Z99.81

## 2016-02-20 LAB — URINE MICROSCOPIC-ADD ON: RBC / HPF: NONE SEEN RBC/hpf (ref 0–5)

## 2016-02-20 LAB — URINALYSIS, ROUTINE W REFLEX MICROSCOPIC
BILIRUBIN URINE: NEGATIVE
GLUCOSE, UA: NEGATIVE mg/dL
Hgb urine dipstick: NEGATIVE
Ketones, ur: NEGATIVE mg/dL
NITRITE: NEGATIVE
PH: 6 (ref 5.0–8.0)
Protein, ur: NEGATIVE mg/dL
SPECIFIC GRAVITY, URINE: 1.013 (ref 1.005–1.030)

## 2016-02-20 LAB — CBC WITH DIFFERENTIAL/PLATELET
BASOS PCT: 0 %
Basophils Absolute: 0 10*3/uL (ref 0.0–0.1)
Eosinophils Absolute: 0 10*3/uL (ref 0.0–0.7)
Eosinophils Relative: 0 %
HEMATOCRIT: 15.7 % — AB (ref 36.0–46.0)
HEMOGLOBIN: 4.5 g/dL — AB (ref 12.0–15.0)
LYMPHS ABS: 0.9 10*3/uL (ref 0.7–4.0)
LYMPHS PCT: 10 %
MCH: 27.8 pg (ref 26.0–34.0)
MCHC: 28.7 g/dL — AB (ref 30.0–36.0)
MCV: 96.9 fL (ref 78.0–100.0)
MONO ABS: 0.6 10*3/uL (ref 0.1–1.0)
MONOS PCT: 7 %
NEUTROS ABS: 7.7 10*3/uL (ref 1.7–7.7)
NEUTROS PCT: 83 %
Platelets: 138 10*3/uL — ABNORMAL LOW (ref 150–400)
RBC: 1.62 MIL/uL — ABNORMAL LOW (ref 3.87–5.11)
RDW: 16.3 % — AB (ref 11.5–15.5)
WBC: 9.3 10*3/uL (ref 4.0–10.5)

## 2016-02-20 LAB — COMPREHENSIVE METABOLIC PANEL
ALBUMIN: 2.6 g/dL — AB (ref 3.5–5.0)
ALT: 11 U/L — AB (ref 14–54)
AST: 15 U/L (ref 15–41)
Alkaline Phosphatase: 74 U/L (ref 38–126)
Anion gap: 5 (ref 5–15)
BUN: 41 mg/dL — AB (ref 6–20)
CHLORIDE: 105 mmol/L (ref 101–111)
CO2: 31 mmol/L (ref 22–32)
CREATININE: 2.17 mg/dL — AB (ref 0.44–1.00)
Calcium: 9.2 mg/dL (ref 8.9–10.3)
GFR calc Af Amer: 26 mL/min — ABNORMAL LOW (ref >60)
GFR, EST NON AFRICAN AMERICAN: 22 mL/min — AB (ref >60)
GLUCOSE: 117 mg/dL — AB (ref 65–99)
POTASSIUM: 3.9 mmol/L (ref 3.5–5.1)
SODIUM: 141 mmol/L (ref 135–145)
Total Bilirubin: 0.4 mg/dL (ref 0.3–1.2)
Total Protein: 6.2 g/dL — ABNORMAL LOW (ref 6.5–8.1)

## 2016-02-20 LAB — TROPONIN I
TROPONIN I: 0.03 ng/mL — AB (ref <0.03)
Troponin I: 0.03 ng/mL (ref <0.03)

## 2016-02-20 LAB — PREPARE RBC (CROSSMATCH)

## 2016-02-20 LAB — POC OCCULT BLOOD, ED: FECAL OCCULT BLD: POSITIVE — AB

## 2016-02-20 LAB — PROTIME-INR
INR: 2.24 — AB (ref 0.00–1.49)
PROTHROMBIN TIME: 24.5 s — AB (ref 11.6–15.2)

## 2016-02-20 LAB — BRAIN NATRIURETIC PEPTIDE: B NATRIURETIC PEPTIDE 5: 316.5 pg/mL — AB (ref 0.0–100.0)

## 2016-02-20 MED ORDER — ONDANSETRON HCL 4 MG/2ML IJ SOLN: 4.0000 mg | Freq: Four times a day (QID) | INTRAMUSCULAR | Status: DC | PRN

## 2016-02-20 MED ORDER — POTASSIUM CHLORIDE CRYS ER 20 MEQ PO TBCR
20.0000 meq | EXTENDED_RELEASE_TABLET | Freq: Every day | ORAL | Status: DC
  Administered 2016-02-20 – 2016-02-22 (×3): 20 meq via ORAL
  Filled 2016-02-20 (×3): qty 1

## 2016-02-20 MED ORDER — SODIUM CHLORIDE 0.9 % IV SOLN
Freq: Once | INTRAVENOUS | Status: AC
  Administered 2016-02-20: 19:00:00 via INTRAVENOUS

## 2016-02-20 MED ORDER — ALLOPURINOL 300 MG PO TABS
300.0000 mg | ORAL_TABLET | Freq: Every day | ORAL | Status: DC
  Administered 2016-02-20 – 2016-02-22 (×3): 300 mg via ORAL
  Filled 2016-02-20 (×3): qty 1

## 2016-02-20 MED ORDER — ALPRAZOLAM 0.25 MG PO TABS
0.2500 mg | ORAL_TABLET | Freq: Two times a day (BID) | ORAL | Status: DC | PRN
  Administered 2016-02-20 – 2016-02-21 (×2): 0.25 mg via ORAL
  Filled 2016-02-20 (×2): qty 1

## 2016-02-20 MED ORDER — ACETAMINOPHEN 650 MG RE SUPP: 650.0000 mg | Freq: Four times a day (QID) | RECTAL | Status: DC | PRN

## 2016-02-20 MED ORDER — ONDANSETRON HCL 4 MG PO TABS
4.0000 mg | ORAL_TABLET | Freq: Four times a day (QID) | ORAL | Status: DC | PRN
Start: 2016-02-20 — End: 2016-02-22

## 2016-02-20 MED ORDER — ATORVASTATIN CALCIUM 10 MG PO TABS
10.0000 mg | ORAL_TABLET | Freq: Every day | ORAL | Status: DC
  Administered 2016-02-20 – 2016-02-22 (×3): 10 mg via ORAL
  Filled 2016-02-20 (×3): qty 1

## 2016-02-20 MED ORDER — HYDROXYCHLOROQUINE SULFATE 200 MG PO TABS
200.0000 mg | ORAL_TABLET | Freq: Two times a day (BID) | ORAL | Status: DC
  Administered 2016-02-20 – 2016-02-22 (×4): 200 mg via ORAL
  Filled 2016-02-20 (×4): qty 1

## 2016-02-20 MED ORDER — TORSEMIDE 20 MG PO TABS
40.0000 mg | ORAL_TABLET | Freq: Two times a day (BID) | ORAL | Status: DC
  Administered 2016-02-20 – 2016-02-22 (×4): 40 mg via ORAL
  Filled 2016-02-20 (×4): qty 2

## 2016-02-20 MED ORDER — ACETAMINOPHEN 325 MG PO TABS: 650.0000 mg | ORAL_TABLET | Freq: Four times a day (QID) | ORAL | Status: DC | PRN

## 2016-02-20 NOTE — H&P (Signed)
TRH H&P   Patient Demographics:    April Mueller, is a 70 y.o. female  MRN: [id number]   DOB - Nov 10, 1945  Admit Date - 02/20/2016  Outpatient Primary MD for the patient is  Melinda Crutch, MD  Referring MD/NP/PA: Dr Johnney Killian  Outpatient Specialists: Dr. Irene Limbo, at [place]  Patient coming from: Home  Chief Complaint  Patient presents with  . Weakness  . Cough  . Nasal Congestion      HPI:    April Mueller  is a 70 y.o. female, With history of chronic anemia, rheumatoid arthritis, chronic hypoxic respiratory failure, pulmonary hypertension, chronic diastolic heart failure, see daily, sleep apnea  came to the hospital with upper respiratory symptoms of nasal congestion, cough and fatigue for past 1 week. Patient says that she has been increasingly getting short of breath on exertion, though she denies passing out. She denies chest pain. No nausea vomiting or diarrhea. Patient has a history of chronic anemia and is followed by hematology as outpatient. She was seen by hematologist on 01/30/2016 at that time hemoglobin was 8.6. As per hematology note patient has multifactorial anemia due to CKD/MDS/RA/RA. In the ED level. Severe anemia with hemoglobin 4.5, 3 units PRBC ordered  Patient had similar presentation in January when she required 5 units PRBC. All the GI workup including endoscopy, colonoscopy and capsule endoscopy were negative.    Review of systems:    In addition to the HPI above,  No Fever +chills, No Headache, No changes with Vision or hearing, No problems swallowing food or Liquids, No Chest pain No Abdominal pain, No Nausea or Vomitting, Bowel movements are regular,She denies black-colored stool, no vomiting blood No Blood in stool or Urine, No dysuria, urgency or frequency of urination. No new joints pains-aches,    A full 10 point Review of Systems was done, except as stated  above, all other Review of Systems were negative.   With Past History of the following :    Past Medical History  Diagnosis Date  . Hypertension   . Pulmonary HTN ([place])   . Aortic aneurysm ([place])   . Sleep apnea     wears CPAP  . CKD (chronic kidney disease) stage 3, GFR 30-59 ml/min   . Anemia 03/23/15    transfusion  . Arthritis   . CHF (congestive heart failure) ([place])   . Gout   . Rheumatoid arthritis(714.0) 11/06/2012      Past Surgical History  Procedure Laterality Date  . Abdominal aortic aneurysm repair    . Right heart catheterization N/A 08/18/2014    Procedure: RIGHT HEART CATH;  Surgeon: Jolaine Artist, MD;  Location: [facility] CATH LAB;  Service: Cardiovascular;  Laterality: N/A;  . Tee without cardioversion N/A 01/05/2015    Procedure: TRANSESOPHAGEAL ECHOCARDIOGRAM (TEE);  Surgeon: Larey Dresser, MD;  Location: [place];  Service: Cardiovascular;  Laterality: N/A;  . Wisdom tooth extraction    . Givens capsule study N/A 08/21/2015    Procedure: GIVENS CAPSULE STUDY;  Surgeon: Gatha Mayer, MD;  Location: [facility] ENDOSCOPY;  Service: Endoscopy;  Laterality: N/A;  . Esophagogastroduodenoscopy N/A 08/19/2015    Procedure: ESOPHAGOGASTRODUODENOSCOPY (EGD);  Surgeon: Jerene Bears, MD;  [facility]: [facility] ENDOSCOPY;  Service: Endoscopy;  Laterality: N/A;  patient scheduled, anesthesia aware of 1500 case, per [place].  08/18/15 DP  . Colonoscopy N/A 08/19/2015    Procedure: COLONOSCOPY;  Surgeon: Jerene Bears, MD;  Location: [facility] ENDOSCOPY;  Service: Endoscopy;  Laterality: N/A;  . Cardiac catheterization N/A 10/24/2015    Procedure: Right Heart Cath;  Surgeon: Larey Dresser, MD;  Location: [place] CV LAB;  Service: Cardiovascular;  Laterality: N/A;      Social History:     Social History  Substance Use Topics  . Smoking status: Former Smoker -- 1.50 packs/day for 14 years    Types: Cigarettes    Quit date: 08/09/2003  . Smokeless tobacco: Never Used  . Alcohol Use: No          Family History :     Family History  Problem Relation Age of Onset  . Heart attack Mother   . Prostate cancer Father   . Diabetes Brother   . Kidney failure Brother    Heart   Home Medications:   Prior to Admission medications   Medication Sig Start Date End Date Taking? Authorizing Provider  acetaminophen (TYLENOL) 325 MG tablet Take 2 tablets (650 mg total) by mouth every 6 (six) hours as needed for mild pain (or Fever >/= 101). 08/25/15  Yes Albertine Patricia, MD  allopurinol (ZYLOPRIM) 300 MG tablet Take 300 mg by mouth daily.   Yes Historical Provider, MD  ALPRAZolam (XANAX) 0.25 MG tablet Take 0.25 mg by mouth 2 (two) times daily as needed for anxiety or sleep.    Yes Historical Provider, MD  apixaban (ELIQUIS) 5 MG TABS tablet Take 1 tablet (5 mg total) by mouth 2 (two) times daily. 10/24/15  Yes Larey Dresser, MD  atorvastatin (LIPITOR) 10 MG tablet Take 1 tablet (10 mg total) by mouth daily. 03/22/15  Yes Larey Dresser, MD  hydroxychloroquine (PLAQUENIL) 200 MG tablet Take 200 mg by mouth 2 (two) times daily.   Yes Historical Provider, MD  ketoconazole (NIZORAL) 2 % shampoo Apply 1 application topically as needed for irritation. Reported on 01/23/2016 12/15/14  Yes Historical Provider, MD  metoprolol succinate (TOPROL-XL) 25 MG 24 hr tablet Take 1 tablet (25 mg total) by mouth 2 (two) times daily. 09/05/15  Yes Larey Dresser, MD  Omega-3 Fatty Acids (FISH OIL) 1200 MG CAPS Take 1,200 mg by mouth daily.   Yes Historical Provider, MD  OXYGEN Inhale 3-6 L into the lungs continuous.    Yes Historical Provider, MD  potassium chloride SA (KLOR-CON M20) 20 MEQ tablet Take 1 tablet (20 mEq total) by mouth daily. 09/12/15  Yes Larey Dresser, MD  torsemide (DEMADEX) 20 MG tablet Take 2 tablets (40 mg total) by mouth 2 (two) times daily. Patient taking differently: Take 40 mg by mouth 4 (four) times daily.  12/28/15  Yes Larey Dresser, MD     Allergies:    No Known  Allergies   Physical Exam:   Vitals  Blood pressure 99/43, pulse 62, temperature 98.3 F (36.8 C), temperature source Oral, resp. rate 27, SpO2 100 %.   1. General Morbidly obese female* lying in bed in NAD, cooperative with exam  2. Normal affect and insight,  Awake Alert, Oriented X 3.  3. No F.N deficits, ALL C.Nerves Intact, Strength 5/5 all 4 extremities, Sensation intact all 4 extremities, Plantars down going.  4. Ears and Eyes appear Normal, Conjunctivae clear,. Moist Oral Mucosa.  5. Supple Neck, No JVD, No cervical lymphadenopathy appriciated, No Carotid Bruits.  6. Symmetrical Chest wall movement, Good air movement bilaterally, CTAB.  7. RRR, No Gallops, grade 3/6 systolic murmur auscultated at the mitral and aortic area, No Parasternal Heave.  8. Positive Bowel Sounds, Abdomen Soft, No tenderness, No organomegaly appriciated,No rebound -guarding or rigidity.  9.  No Cyanosis, Normal Skin Turgor, No Skin Rash or Bruise.  10. Good muscle tone,  joints appear normal , no effusions, Normal ROM.  11. Bilateral 1+ pitting edema of the lower extremities      Data Review:    CBC  Recent Labs Lab 02/20/16 1540  WBC 9.3  HGB 4.5*  HCT 15.7*  PLT 138*  MCV 96.9  MCH 27.8  MCHC 28.7*  RDW 16.3*  LYMPHSABS 0.9  MONOABS 0.6  EOSABS 0.0  BASOSABS 0.0   ------------------------------------------------------------------------------------------------------------------  Chemistries   Recent Labs Lab 02/20/16 1540  NA 141  K 3.9  CL 105  CO2 31  GLUCOSE 117*  BUN 41*  CREATININE 2.17*  CALCIUM 9.2  AST 15  ALT 11*  ALKPHOS 74  BILITOT 0.4   ------------------------------------------------------------------------------------------------------------------ CrCl cannot be calculated (Unknown ideal weight.). ------------------------------------------------------------------------------------------------------------------ No results for input(s): TSH,  T4TOTAL, T3FREE, THYROIDAB in the last 72 hours.  Invalid input(s): FREET3  Coagulation profile No results for input(s): INR, PROTIME in the last 168 hours. ------------------------------------------------------------------------------------------------------------------- No results for input(s): DDIMER in the last 72 hours. -------------------------------------------------------------------------------------------------------------------  Cardiac Enzymes  Recent Labs Lab 02/20/16 1540  TROPONINI 0.03*   ------------------------------------------------------------------------------------------------------------------    Component Value Date/Time   BNP 316.5* 02/20/2016 1540     ---------------------------------------------------------------------------------------------------------------  Urinalysis    Component Value Date/Time   COLORURINE YELLOW 02/20/2016 1550   APPEARANCEUR CLOUDY* 02/20/2016 1550   LABSPEC 1.013 02/20/2016 1550   PHURINE 6.0 02/20/2016 1550   GLUCOSEU NEGATIVE 02/20/2016 1550   HGBUR NEGATIVE 02/20/2016 1550   BILIRUBINUR NEGATIVE 02/20/2016 1550   KETONESUR NEGATIVE 02/20/2016 1550   PROTEINUR NEGATIVE 02/20/2016 1550   NITRITE NEGATIVE 02/20/2016 1550   LEUKOCYTESUR TRACE* 02/20/2016 1550    ----------------------------------------------------------------------------------------------------------------   Imaging Results:    Dg Chest 2 View  02/20/2016  CLINICAL DATA:  Shortness breath, cough, weakness EXAM: CHEST  2 VIEW COMPARISON:  08/16/2015 FINDINGS: Chronic scarring in the right middle lung and bilateral lower lobes. No focal consolidation. No pleural effusion or pneumothorax. Cardiomegaly. Mild degenerative changes of the visualized thoracolumbar spine. Median sternotomy. IMPRESSION: No evidence of acute cardiopulmonary disease. Electronically Signed   By: Julian Hy M.D.   On: 02/20/2016 12:20    My personal review of EKG: atrial  defibrillation   Assessment & Plan:    Active Problems:   Symptomatic anemia   Anemia   Rheumatoid arthritis   Pulmonary hypertension   Chronic respiratory failure   Chronic kidney disease stage III       1. Symptomatic anemia- patient presenting with hemoglobin of 4.5, with severe symptoms of anemia with  shortness of breath. Will admit the patient to observation, start blood transfusion. PRBC 3 units, ordered in the ED. Recheck CBC in a.m. All the workup for anemia recently has been inconclusive, likely multifactorial as per hematology note from 01/30/2016. We will check stool for occult blood. 2. Acute on chronic respiratory failure- patient has  history of chronic diastolic CHF, now presenting with worsening of shortness of breath from anemia. Once patient gets blood transfusion, we'll start her on torsemide 40 mg twice a day from tonight. 3. Atrial fibrillation- CHADS2vasc score is 3, patient was started on Eliquis by cardiology. Will hold Eliquis at this time due to severe anemia, and history of GI bleed in the past. 4. History of pulmonary hypertension/right heart failure- patient not a candidate for pulmonary vasodilators. 5. Obstructive sleep apnea- continue CPAP at bedtime 6. History of rheumatoid arthritis- continue Plaquenil 7. Chronic kidney disease stage III- today creatinine is 2.17, stable at baseline.   DVT Prophylaxis-   SCDs   AM Labs Ordered, also please review Full Orders  Family Communication: No family at bedside.  Code Status:  Full code  Admission status: Observation  Time spent in minutes : 60 minutes   Delan Ksiazek S M.D on 02/20/2016 at 5:07 PM  Between 7am to 7pm - Pager - [phone]. After 7pm go to [email or web address] - password [facility]  [facility] - Office  [phone]

## 2016-02-20 NOTE — ED Notes (Signed)
Attempted to call report x 1  

## 2016-02-20 NOTE — ED Notes (Signed)
Blood consent signed and placed at bedside 

## 2016-02-20 NOTE — ED Notes (Signed)
Pt returned to room from xray.

## 2016-02-20 NOTE — ED Notes (Signed)
3 units of blood ready in bloodbank

## 2016-02-20 NOTE — ED Notes (Signed)
Patients blood is not ready and she has a bed, receiving nurse on [place] is aware that the blood is not yet ready, vs wnl

## 2016-02-20 NOTE — ED Provider Notes (Signed)
CSN: [id number]     Arrival date & time 02/20/16  1048 History   First MD Initiated Contact with Patient 02/20/16 1256     Chief Complaint  Patient presents with  . Weakness  . Cough  . Nasal Congestion     (Consider location/radiation/quality/duration/timing/severity/associated sxs/prior Treatment) HPI Patient has symptoms as starting approximately a week ago that seem consistent with cold symptoms. She reports runny eyes, nasal drainage and stuffiness. He reports she also had some cough and felt like her chest was congested. No fevers or mucus production with cough. Patient reports that she did not seek treatment initially because she felt that this was probably cold symptoms. She reports however she's been increasingly fatigued and now with even minor activity she feels winded and that she needs rest. No chest pain. No palpitations. Patient has a history of anemia. No pain burning or urgency with urination. Patient reports that she does monitor her home oxygen level and her oxygen saturation saturations have been in the 90s. At baseline she were 6 L of continuous oxygen. Past Medical History  Diagnosis Date  . Hypertension   . Pulmonary HTN ([place])   . Aortic aneurysm ([place])   . Sleep apnea     wears CPAP  . CKD (chronic kidney disease) stage 3, GFR 30-59 ml/min   . Anemia 03/23/15    transfusion  . Arthritis   . CHF (congestive heart failure) ([place])   . Gout   . Rheumatoid arthritis(714.0) 11/06/2012   Past Surgical History  Procedure Laterality Date  . Abdominal aortic aneurysm repair    . Right heart catheterization N/A 08/18/2014    Procedure: RIGHT HEART CATH;  Surgeon: Jolaine Artist, MD;  Location: [facility] CATH LAB;  Service: Cardiovascular;  Laterality: N/A;  . Tee without cardioversion N/A 01/05/2015    Procedure: TRANSESOPHAGEAL ECHOCARDIOGRAM (TEE);  Surgeon: Larey Dresser, MD;  Location: [place];  Service: Cardiovascular;  Laterality: N/A;  . Wisdom tooth extraction     . Givens capsule study N/A 08/21/2015    Procedure: GIVENS CAPSULE STUDY;  Surgeon: Gatha Mayer, MD;  Location: [place];  Service: Endoscopy;  Laterality: N/A;  . Esophagogastroduodenoscopy N/A 08/19/2015    Procedure: ESOPHAGOGASTRODUODENOSCOPY (EGD);  Surgeon: Jerene Bears, MD;  Location: [facility] ENDOSCOPY;  Service: Endoscopy;  Laterality: N/A;  patient scheduled, anesthesia aware of 1500 case, per [place].  08/18/15 DP  . Colonoscopy N/A 08/19/2015    Procedure: COLONOSCOPY;  Surgeon: Jerene Bears, MD;  Location: [facility] ENDOSCOPY;  Service: Endoscopy;  Laterality: N/A;  . Cardiac catheterization N/A 10/24/2015    Procedure: Right Heart Cath;  Surgeon: Larey Dresser, MD;  Location: [place] CV LAB;  Service: Cardiovascular;  Laterality: N/A;   Family History  Problem Relation Age of Onset  . Heart attack Mother   . Prostate cancer Father   . Diabetes Brother   . Kidney failure Brother    Social History  Substance Use Topics  . Smoking status: Former Smoker -- 1.50 packs/day for 14 years    Types: Cigarettes    Quit date: 08/09/2003  . Smokeless tobacco: Never Used  . Alcohol Use: No   OB History    No data available     Review of Systems 10 Systems reviewed and are negative for acute change except as noted in the HPI.    Allergies  Review of patient's allergies indicates no known allergies.  Home Medications   Prior to Admission medications  Medication Sig Start Date End Date Taking? Authorizing Provider  acetaminophen (TYLENOL) 325 MG tablet Take 2 tablets (650 mg total) by mouth every 6 (six) hours as needed for mild pain (or Fever >/= 101). 08/25/15  Yes Albertine Patricia, MD  allopurinol (ZYLOPRIM) 300 MG tablet Take 300 mg by mouth daily.   Yes Historical Provider, MD  ALPRAZolam (XANAX) 0.25 MG tablet Take 0.25 mg by mouth 2 (two) times daily as needed for anxiety or sleep.    Yes Historical Provider, MD  apixaban (ELIQUIS) 5 MG TABS tablet Take 1 tablet (5 mg  total) by mouth 2 (two) times daily. 10/24/15  Yes Larey Dresser, MD  atorvastatin (LIPITOR) 10 MG tablet Take 1 tablet (10 mg total) by mouth daily. 03/22/15  Yes Larey Dresser, MD  hydroxychloroquine (PLAQUENIL) 200 MG tablet Take 200 mg by mouth 2 (two) times daily.   Yes Historical Provider, MD  ketoconazole (NIZORAL) 2 % shampoo Apply 1 application topically as needed for irritation. Reported on 01/23/2016 12/15/14  Yes Historical Provider, MD  metoprolol succinate (TOPROL-XL) 25 MG 24 hr tablet Take 1 tablet (25 mg total) by mouth 2 (two) times daily. 09/05/15  Yes Larey Dresser, MD  Omega-3 Fatty Acids (FISH OIL) 1200 MG CAPS Take 1,200 mg by mouth daily.   Yes Historical Provider, MD  OXYGEN Inhale 3-6 L into the lungs continuous.    Yes Historical Provider, MD  potassium chloride SA (KLOR-CON M20) 20 MEQ tablet Take 1 tablet (20 mEq total) by mouth daily. 09/12/15  Yes Larey Dresser, MD  torsemide (DEMADEX) 20 MG tablet Take 2 tablets (40 mg total) by mouth 2 (two) times daily. Patient taking differently: Take 40 mg by mouth 4 (four) times daily.  12/28/15  Yes Larey Dresser, MD   BP 99/43 mmHg  Pulse 62  Temp(Src) 98.3 F (36.8 C) (Oral)  Resp 27  SpO2 100% Physical Exam  Constitutional: She is oriented to person, place, and time. She appears well-developed and well-nourished.  HENT:  Head: Normocephalic and atraumatic.  Right Ear: External ear normal.  Left Ear: External ear normal.  Nose: Nose normal.  Mouth/Throat: Oropharynx is clear and moist.  Bilateral TMs normal.  Eyes: EOM are normal. Pupils are equal, round, and reactive to light.  Neck: Neck supple.  Cardiovascular: Normal rate, regular rhythm and intact distal pulses.   [id number] systolic ejection murmur.  Pulmonary/Chest: Effort normal and breath sounds normal.  Soft breath sounds at the bases but no gross wheeze rale or rhonchi.  Abdominal: Soft. Bowel sounds are normal. She exhibits no distension. There is no  tenderness.  Musculoskeletal: Normal range of motion. She exhibits no edema or tenderness.  Neurological: She is alert and oriented to person, place, and time. She has normal strength. No cranial nerve deficit. She exhibits normal muscle tone. Coordination normal. GCS eye subscore is 4. GCS verbal subscore is 5. GCS motor subscore is 6.  Skin: Skin is warm, dry and intact.  Psychiatric: She has a normal mood and affect.    ED Course  Procedures (including critical care time) Labs Review Labs Reviewed  CBC WITH DIFFERENTIAL/PLATELET - Abnormal; Notable for the following:    RBC 1.62 (*)    Hemoglobin 4.5 (*)    HCT 15.7 (*)    MCHC 28.7 (*)    RDW 16.3 (*)    Platelets 138 (*)    All other components within normal limits  URINALYSIS, ROUTINE W REFLEX MICROSCOPIC (  NOT AT [facility]) - Abnormal; Notable for the following:    APPearance CLOUDY (*)    Leukocytes, UA TRACE (*)    All other components within normal limits  URINE MICROSCOPIC-ADD ON - Abnormal; Notable for the following:    Squamous Epithelial / LPF 6-30 (*)    Bacteria, UA FEW (*)    All other components within normal limits  COMPREHENSIVE METABOLIC PANEL  BRAIN NATRIURETIC PEPTIDE  TROPONIN I  POC OCCULT BLOOD, ED  PREPARE RBC (CROSSMATCH)  TYPE AND SCREEN    Imaging Review Dg Chest 2 View  02/20/2016  CLINICAL DATA:  Shortness breath, cough, weakness EXAM: CHEST  2 VIEW COMPARISON:  08/16/2015 FINDINGS: Chronic scarring in the right middle lung and bilateral lower lobes. No focal consolidation. No pleural effusion or pneumothorax. Cardiomegaly. Mild degenerative changes of the visualized thoracolumbar spine. Median sternotomy. IMPRESSION: No evidence of acute cardiopulmonary disease. Electronically Signed   By: Julian Hy M.D.   On: 02/20/2016 12:20   I have personally reviewed and evaluated these images and lab results as part of my medical decision-making.   EKG Interpretation   Date/Time:  Monday February 20 2016 11:20:54 EDT Ventricular Rate:  64 PR Interval:    QRS Duration: 88 QT Interval:  440 QTC Calculation: 453 R Axis:   58 Text Interpretation:  Atrial fibrillation Septal infarct , age  undetermined Abnormal ECG agree. no change from previous Confirmed by  Johnney Killian, MD, Jeannie Done [phone]) on 02/20/2016 12:56:28 PM     Consult: [facility] Dr. Darrick Meigs will admit for treatment. MDM   Final diagnoses:  Symptomatic anemia  Severe comorbid illness   The patient presents with what appeared to be URI symptoms fairly benign. However had dyspnea and significant exertional fatigue. The patient has become very anemic and this is certainly the cause of her acute symptoms. However is not experiencing chest pain, and black or bloody stool or syncope. Patient be admitted for transfusion.    Charlesetta Shanks, MD 02/20/16 1640

## 2016-02-20 NOTE — ED Notes (Signed)
Placed pt on bed pan.

## 2016-02-20 NOTE — ED Notes (Signed)
Er md notified of critical hgb

## 2016-02-20 NOTE — ED Notes (Signed)
Pt here with cough/cold symptoms x 1 week. Pt has pulmonary hypertension with extensive cardiopulmonary history. Pt reports generalized weakness since yesterday. Pt a/o x 4. No neuro deficits noted.. Pt wears 6L continuous o2.

## 2016-02-21 DIAGNOSIS — D638 Anemia in other chronic diseases classified elsewhere: Secondary | ICD-10-CM | POA: Diagnosis not present

## 2016-02-21 DIAGNOSIS — K921 Melena: Secondary | ICD-10-CM

## 2016-02-21 DIAGNOSIS — D649 Anemia, unspecified: Secondary | ICD-10-CM

## 2016-02-21 DIAGNOSIS — I340 Nonrheumatic mitral (valve) insufficiency: Secondary | ICD-10-CM

## 2016-02-21 DIAGNOSIS — I481 Persistent atrial fibrillation: Secondary | ICD-10-CM | POA: Diagnosis not present

## 2016-02-21 DIAGNOSIS — N183 Chronic kidney disease, stage 3 (moderate): Secondary | ICD-10-CM | POA: Diagnosis not present

## 2016-02-21 DIAGNOSIS — J9611 Chronic respiratory failure with hypoxia: Secondary | ICD-10-CM | POA: Diagnosis not present

## 2016-02-21 DIAGNOSIS — D500 Iron deficiency anemia secondary to blood loss (chronic): Secondary | ICD-10-CM | POA: Diagnosis not present

## 2016-02-21 DIAGNOSIS — I10 Essential (primary) hypertension: Secondary | ICD-10-CM | POA: Diagnosis not present

## 2016-02-21 LAB — CBC
HEMATOCRIT: 23.2 % — AB (ref 36.0–46.0)
HEMOGLOBIN: 7.3 g/dL — AB (ref 12.0–15.0)
MCH: 28.3 pg (ref 26.0–34.0)
MCHC: 31.5 g/dL (ref 30.0–36.0)
MCV: 89.9 fL (ref 78.0–100.0)
Platelets: 126 10*3/uL — ABNORMAL LOW (ref 150–400)
RBC: 2.58 MIL/uL — ABNORMAL LOW (ref 3.87–5.11)
RDW: 17.8 % — ABNORMAL HIGH (ref 11.5–15.5)
WBC: 9.1 10*3/uL (ref 4.0–10.5)

## 2016-02-21 LAB — COMPREHENSIVE METABOLIC PANEL
ALK PHOS: 82 U/L (ref 38–126)
ALT: 11 U/L — ABNORMAL LOW (ref 14–54)
ANION GAP: 6 (ref 5–15)
AST: 14 U/L — ABNORMAL LOW (ref 15–41)
Albumin: 2.8 g/dL — ABNORMAL LOW (ref 3.5–5.0)
BILIRUBIN TOTAL: 1 mg/dL (ref 0.3–1.2)
BUN: 37 mg/dL — ABNORMAL HIGH (ref 6–20)
CALCIUM: 9.2 mg/dL (ref 8.9–10.3)
CO2: 30 mmol/L (ref 22–32)
Chloride: 103 mmol/L (ref 101–111)
Creatinine, Ser: 2.1 mg/dL — ABNORMAL HIGH (ref 0.44–1.00)
GFR calc non Af Amer: 23 mL/min — ABNORMAL LOW (ref >60)
GFR, EST AFRICAN AMERICAN: 27 mL/min — AB (ref >60)
GLUCOSE: 105 mg/dL — AB (ref 65–99)
Potassium: 3.8 mmol/L (ref 3.5–5.1)
Sodium: 139 mmol/L (ref 135–145)
TOTAL PROTEIN: 6.2 g/dL — AB (ref 6.5–8.1)

## 2016-02-21 LAB — HEMOGLOBIN AND HEMATOCRIT, BLOOD
HCT: 29.2 % — ABNORMAL LOW (ref 36.0–46.0)
Hemoglobin: 9.1 g/dL — ABNORMAL LOW (ref 12.0–15.0)

## 2016-02-21 LAB — PREPARE RBC (CROSSMATCH)

## 2016-02-21 LAB — TROPONIN I: Troponin I: 0.03 ng/mL (ref <0.03)

## 2016-02-21 MED ORDER — SODIUM CHLORIDE 0.9 % IV SOLN
INTRAVENOUS | Status: DC
  Administered 2016-02-21 – 2016-02-22 (×2): 1000 mL via INTRAVENOUS
  Administered 2016-02-22: 10:00:00 via INTRAVENOUS

## 2016-02-21 MED ORDER — SODIUM CHLORIDE 0.9 % IV SOLN
Freq: Once | INTRAVENOUS | Status: AC
  Administered 2016-02-21: 10:00:00 via INTRAVENOUS

## 2016-02-21 NOTE — Progress Notes (Signed)
Pt has home CPAP. Refilled with [facility] waterl

## 2016-02-21 NOTE — Consult Note (Signed)
CARDIOLOGY CONSULT NOTE       Patient ID: April Mueller MRN: [id number] DOB/AGE: 1946-06-20 70 y.o.  Admit date: 02/20/2016 Referring Physician:  Darrick Meigs Primary Physician:  Melinda Crutch, MD Primary Cardiologist:  Aundra Dubin Reason for Consultation: Anticoagulation   Active Problems:   Secondary pulmonary hypertension ([place])   Rheumatoid arthritis ([place])   OSA on CPAP   Chronic respiratory failure with hypoxia (HCC)   Mitral regurgitation   Normocytic anemia   CKD (chronic kidney disease) stage 3, GFR 30-59 ml/min   Anemia of chronic disease   Symptomatic anemia   HTN (hypertension)   Myelodysplastic syndrome (HCC)   Anemia   Persistent atrial fibrillation (HCC)   HPI:  70 y.o. admitted with recurrent symptomatic anemia.  Has chronic afib and has been on eliquis. Has seen Dr Rayann Heman after 2nd presumed GI bleed and thought not to be a candidate For Watchman.  History of COPD. Pulmonary HTN and MR.  Degree of MR has been noted to be severe in past and she was also apparently turned down for mitral clipping at [facility] Review of Dr Jackalyn Lombard note from March indicated he was concerned that one of her GI bleeds was on aspirin alone and she would need to be on asa life long with device. Last admission Had full GI w/u with negative EGD/capsule endoscopy and colonoscopy. Thought to have combination of ACD and GI bleeding.  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 Wall thickness was  normal.  Systolic function was normal. The estimated ejection  fraction was in the range of 55% to 60%. - Aortic valve: There was trivial regurgitation. - Mitral valve: Calcified annulus. There was mild to moderate  regurgitation. - Right atrium: The atrium was mildly dilated. - Atrial septum: Bowed in Parasternal images suggested high LA  pressure No defect or patent foramen ovale was identified. - Tricuspid valve: There was moderate regurgitation. - Pulmonary arteries: PA peak pressure: 66 mm Hg (S). - Impressions: Moderate dilatation of descending thoracic aorta on  PSL axis images  Impressions:  - Moderate dilatation of descending thoracic aorta on PSL axis  images  ROS All other systems reviewed and negative except as noted above  Past Medical History  Diagnosis Date  . Hypertension   . Pulmonary HTN ([place])   . Aortic aneurysm ([place])   . Anemia 03/23/15    transfusion  . CHF (congestive heart failure) ([place])   . Gout   . History of blood transfusion "several"  . Heart murmur   . COPD (chronic obstructive pulmonary disease) ([place])   . On home oxygen therapy     "3L; 24/7" (02/20/2016)  . Tuberculosis     dormant carrier  . OSA on CPAP   . Lupus ([place])   . Arthritis     "qwhere" (02/20/2016)  . Rheumatoid arthritis(714.0) 11/06/2012  . Chronic lower back pain   . Anxiety   . CKD (chronic kidney disease)  stage 3, GFR 30-59 ml/min     "on dialysis for 3 months after my aneurysm in 2005" (02/20/2016)    Family History  Problem Relation Age of Onset  . Heart attack Mother   . Prostate cancer Father   . Diabetes Brother   . Kidney failure Brother     Social History   Social History  . Marital Status: Legally Separated    Spouse Name: N/A  . Number of Children: 1  . Years of Education: N/A   Occupational History  . Disability     Office Work   Social History Main Topics  . Smoking status: Former Smoker -- 1.50 packs/day for 14 years    Types: Cigarettes    Quit  date: 08/09/2003  . Smokeless tobacco: Never Used  . Alcohol Use: 0.0 oz/week    0 Standard drinks or equivalent per week     Comment: 02/20/2016 "drank a little in my teens"  . Drug Use: No  . Sexual Activity: No   Other Topics Concern  . Not on file   Social History Narrative    Past Surgical History  Procedure Laterality Date  . Abdominal aortic aneurysm repair  2005  . Right heart catheterization N/A 08/18/2014    Procedure: RIGHT HEART CATH;  Surgeon: Jolaine Artist, MD;  Location: [facility] CATH LAB;  Service: Cardiovascular;  Laterality: N/A;  . Tee without cardioversion N/A 01/05/2015    Procedure: TRANSESOPHAGEAL ECHOCARDIOGRAM (TEE);  Surgeon: Larey Dresser, MD;  Location: [place];  Service: Cardiovascular;  Laterality: N/A;  . Wisdom tooth extraction    . Givens capsule study N/A 08/21/2015    Procedure: GIVENS CAPSULE STUDY;  Surgeon: Gatha Mayer, MD;  Location: [place];  Service: Endoscopy;  Laterality: N/A;  . Esophagogastroduodenoscopy N/A 08/19/2015    Procedure: ESOPHAGOGASTRODUODENOSCOPY (EGD);  Surgeon: Jerene Bears, MD;  Location: [facility] ENDOSCOPY;  Service: Endoscopy;  Laterality: N/A;  patient scheduled, anesthesia aware of 1500 case, per [place].  08/18/15 DP  . Colonoscopy N/A 08/19/2015    Procedure: COLONOSCOPY;  Surgeon: Jerene Bears, MD;  Location: [facility] ENDOSCOPY;  Service: Endoscopy;  Laterality: N/A;  . Cardiac catheterization N/A 10/24/2015    Procedure: Right Heart Cath;  Surgeon: Larey Dresser, MD;  Location: [place] CV LAB;  Service: Cardiovascular;  Laterality: N/A;  . Hemorrhoid surgery    . Breast lumpectomy Right   . Cataract extraction w/ intraocular lens  implant, bilateral Bilateral 2016     . sodium chloride   Intravenous Once  . allopurinol  300 mg Oral Daily  . atorvastatin  10 mg Oral Daily  . hydroxychloroquine  200 mg Oral BID  . potassium chloride SA  20 mEq Oral Daily  . torsemide  40 mg Oral BID      Physical Exam: Blood  pressure 107/50, pulse 66, temperature 98.2 F (36.8 C), temperature source Oral, resp. rate 20, height 5\' 4"  (1.626 m), weight 208 lb 8.9 oz (94.6 kg), SpO2 98 %.   Affect appropriate Overweight black female  HEENT: normal Neck supple with no adenopathy JVP normal no bruits no thyromegaly Lungs clear with no wheezing and good diaphragmatic motion Heart:  S1/S2 MR  murmur, no rub, gallop or click PMI normal Abdomen: benighn, BS positve, no tenderness, no AAA no bruit.  No HSM or HJR Distal pulses intact with no bruits No edema Neuro non-focal Skin warm and dry No muscular weakness   Labs:   Lab Results  Component Value Date   WBC 9.1 02/21/2016   HGB 7.3* 02/21/2016   HCT 23.2* 02/21/2016   MCV 89.9 02/21/2016   PLT 126* 02/21/2016    Recent Labs Lab 02/21/16 0729  NA 139  K 3.8  CL 103  CO2 30  BUN 37*  CREATININE 2.10*  CALCIUM 9.2  PROT 6.2*  BILITOT 1.0  ALKPHOS 82  ALT 11*  AST 14*  GLUCOSE 105*   Lab Results  Component Value Date   TROPONINI 0.03* 02/21/2016    Lab Results  Component Value Date   CHOL 112 02/16/2015   Lab Results  Component Value Date   HDL 52 02/16/2015   Lab Results  Component Value Date   LDLCALC 54 02/16/2015   Lab Results  Component Value Date   TRIG 31 02/16/2015   Lab Results  Component Value Date   CHOLHDL 2.2 02/16/2015   No results found for: LDLDIRECT    Radiology: Dg Chest 2 View  02/20/2016  CLINICAL DATA:  Shortness breath, cough, weakness EXAM: CHEST  2 VIEW COMPARISON:  08/16/2015 FINDINGS: Chronic scarring in the right middle lung and bilateral lower lobes. No focal consolidation. No pleural effusion or pneumothorax. Cardiomegaly. Mild degenerative changes of the visualized thoracolumbar spine. Median sternotomy. IMPRESSION: No evidence of acute cardiopulmonary disease. Electronically Signed   By: Julian Hy M.D.   On: 02/20/2016 12:20    EKG:  afib rate 64 no acute changes   ASSESSMENT AND  PLAN:  Afib:  Rate control is fine D/C Eliquis With 3 admissions for significant transfusions do not think it is wise to continue despite CHA2VAS 4.  Will Discuss with Dr Aundra Dubin re-evaluation for Watchman.   Pulmonary HTN:  Stable by right heart no vasodilators per DM Type A Dissection: stable medistinum on CXR no chest pain COPD:  sats ok on room air no wheezing Anemia:  2 more units today etiology not clear ACD and ? Slow GI bleed negative w/u last admission  Signed: Jenkins Rouge 02/21/2016, 11:56 AM

## 2016-02-21 NOTE — Progress Notes (Signed)
[facility]  PROGRESS NOTE  April Mueller [id number] DOB: [birth year] DOA: 02/20/2016 PCP:  Melinda Crutch, MD    Brief HPI:  70 y.o. female, With history of chronic anemia, rheumatoid arthritis, chronic hypoxic respiratory failure, pulmonary hypertension, chronic diastolic heart failure, see daily, sleep apnea came to the hospital with upper respiratory symptoms of nasal congestion, cough and fatigue for past 1 week. Patient says that she has been increasingly getting short of breath on exertion, though she denies passing out. She denies chest pain. No nausea vomiting or diarrhea. Patient has a history of chronic anemia and is followed by hematology as outpatient. She was seen by hematologist on 01/30/2016 at that time hemoglobin was 8.6. As per hematology note patient has multifactorial anemia due to CKD/MDS/RA/RA. In the ED level. Severe anemia with hemoglobin 4.5, 3 units PRBC ordered  Patient had similar presentation in January when she required 5 units PRBC. All the GI workup including endoscopy, colonoscopy and capsule endoscopy were negative.  Active Problems:   Secondary pulmonary hypertension (HCC)   Rheumatoid arthritis (HCC)   OSA on CPAP   Chronic respiratory failure with hypoxia (HCC)   Mitral regurgitation   Normocytic anemia   CKD (chronic kidney disease) stage 3, GFR 30-59 ml/min   Anemia of chronic disease   Symptomatic anemia   HTN (hypertension)   Myelodysplastic syndrome (HCC)   Anemia   Persistent atrial fibrillation (HCC)   Assessment/Plan:  1. Symptomatic anemia- patient presented with hemoglobin of 4.5, with severe symptoms of anemia with shortness of breath. PRBC 3 units, ordered in the ED. Hemoglobin this morning 7.3. All the workup for anemia recently has been inconclusive, likely multifactorial as per hematology note from 01/30/2016. FOBT positive. Will transfuse 2 more units PRBC 2. Guaiac-positive stool- patient has FOBT positive, GI consulted  and agreed with holding eliquis. Will consult cardiology to see if he can discontinue this medication. 3. Acute on chronic respiratory failure- improving, patient has history of chronic diastolic CHF, now presenting with worsening of shortness of breath from anemia. Once patient gets blood transfusion, we'll start her on torsemide 40 mg twice a day from tonight. 4. Atrial fibrillation- CHADS2vasc score is 3, patient was started on Eliquis by cardiology. Holding  Eliquis at this time due to severe anemia, and history of GI bleed in the past. Cardiology consulted. 5. History of pulmonary hypertension/right heart failure- patient not a candidate for pulmonary vasodilators. 6. Obstructive sleep apnea- continue CPAP at bedtime 7. History of rheumatoid arthritis- continue Plaquenil 8. Chronic kidney disease stage III- today creatinine is 2.10, stable at baseline.  DVT prophylaxis: SCD Code Status: Full code Family Communication: No family at bedside Disposition Plan: likely home in 1-2 days   Consultants:  GI  Cardiology  Procedures:  None   Antibiotics:  None   Subjective: Patient feels better this morning, hemoglobin is 7.3.  Objective: Filed Vitals:   02/21/16 1113 02/21/16 1150 02/21/16 1215 02/21/16 1235  BP: 107/50 119/56 99/56 108/56  Pulse: 66  66   Temp: 98.2 F (36.8 C) 98.5 F (36.9 C) 98.5 F (36.9 C) 98.6 F (37 C)  TempSrc: Oral Oral Oral Oral  Resp: 20 27 18 20   Height:      Weight:      SpO2: 98%  99% 100%    Intake/Output Summary (Last 24 hours) at 02/21/16 1307 Last data filed at 02/21/16 1301  Gross per 24 hour  Intake   1744 ml  Output   1200  ml  Net    544 ml   Filed Weights   02/20/16 2100 02/21/16 0231  Weight: 94.666 kg (208 lb 11.2 oz) 94.6 kg (208 lb 8.9 oz)    Examination:  General exam: Appears calm and comfortable  Respiratory system: Clear to auscultation. Respiratory effort normal. Cardiovascular system: S1 & S2 heard, RRR. No  JVD, murmurs, rubs, gallops or clicks. No pedal edema. Gastrointestinal system: Abdomen is nondistended, soft and nontender. No organomegaly or masses felt. Normal bowel sounds heard. Central nervous system: Alert and oriented. No focal neurological deficits. Extremities: Symmetric 5 x 5 power. Skin: No rashes, lesions or ulcers Psychiatry: Judgement and insight appear normal. Mood & affect appropriate.    Data Reviewed: I have personally reviewed following labs and imaging studies Basic Metabolic Panel:  Recent Labs Lab 02/20/16 1540 02/21/16 0729  NA 141 139  K 3.9 3.8  CL 105 103  CO2 31 30  GLUCOSE 117* 105*  BUN 41* 37*  CREATININE 2.17* 2.10*  CALCIUM 9.2 9.2   Liver Function Tests:  Recent Labs Lab 02/20/16 1540 02/21/16 0729  AST 15 14*  ALT 11* 11*  ALKPHOS 74 82  BILITOT 0.4 1.0  PROT 6.2* 6.2*  ALBUMIN 2.6* 2.8*   No results for input(s): LIPASE, AMYLASE in the last 168 hours. No results for input(s): AMMONIA in the last 168 hours. CBC:  Recent Labs Lab 02/20/16 1540 02/21/16 0729  WBC 9.3 9.1  NEUTROABS 7.7  --   HGB 4.5* 7.3*  HCT 15.7* 23.2*  MCV 96.9 89.9  PLT 138* 126*   Cardiac Enzymes:  Recent Labs Lab 02/20/16 1540 02/20/16 1852 02/21/16 0729  TROPONINI 0.03* 0.03* 0.03*   BNP (last 3 results)  Recent Labs  11/22/15 1049 01/23/16 1045 02/20/16 1540  BNP 198.0* 239.7* 316.5*      Studies: Dg Chest 2 View  02/20/2016  CLINICAL DATA:  Shortness breath, cough, weakness EXAM: CHEST  2 VIEW COMPARISON:  08/16/2015 FINDINGS: Chronic scarring in the right middle lung and bilateral lower lobes. No focal consolidation. No pleural effusion or pneumothorax. Cardiomegaly. Mild degenerative changes of the visualized thoracolumbar spine. Median sternotomy. IMPRESSION: No evidence of acute cardiopulmonary disease. Electronically Signed   By: Julian Hy M.D.   On: 02/20/2016 12:20    Scheduled Meds: . sodium chloride    Intravenous Once  . allopurinol  300 mg Oral Daily  . atorvastatin  10 mg Oral Daily  . hydroxychloroquine  200 mg Oral BID  . potassium chloride SA  20 mEq Oral Daily  . torsemide  40 mg Oral BID   Continuous Infusions:      Time spent: 25 min    [facility] Pager [phone]. If 7PM-7AM, please contact night-coverage at [email or web address], Office  [phone]  password [facility] 02/21/2016, 1:07 PM

## 2016-02-21 NOTE — Care Management Note (Signed)
Case Management Note  Patient Details  Name: April Mueller MRN: [id number] Date of Birth: 24-Sep-1945  Subjective/Objective:   Patient is from home alone, she is indep and has a walker at home.  She has insurance and pcp and transportation.  NCM will cont to follow for dc needs.                  Action/Plan:   Expected Discharge Date:                  Expected Discharge Plan:  Home/Self Care  In-House Referral:     Discharge planning Services  CM Consult  Post Acute Care Choice:    Choice offered to:     DME Arranged:    DME Agency:     HH Arranged:    HH Agency:     Status of Service:  Completed, signed off  If discussed at [employer] of Stay Meetings, dates discussed:    Additional Comments:  Zenon Mayo, RN 02/21/2016, 2:06 PM

## 2016-02-21 NOTE — Progress Notes (Signed)
Placed patient on home CPAP unit with oxygen set at 3lpm

## 2016-02-21 NOTE — Consult Note (Signed)
[facility] Consult: 10:05 AM 02/21/2016     Referring Provider: Dr Darrick Meigs  Primary Care Physician:   Melinda Crutch, MD Primary Gastroenterologist:  Dr. Hilarie Fredrickson.     Reason for Consultation:  Anemia FOBT +.     HPI: April Mueller is a 70 y.o. female.  Multiple medical issues. CKD stage 3. OSA on CPAP. Mitral regurge (delined for TAVR in [place]). Pulm htn on home oxygen.  Obesity.  Diastolic heart failure.  S/p repair ascending aortic aneurysm. Rheumatoid arthritis. Hx epistaxis.  Started Eliquis 10/2015 for Afib.  Thrombocytopenia.  Previous GI work up for FOBT + anemia.  1/14 Colonoscopy for anemia/FOBT +/hx polyps, Dr Hilarie Fredrickson.  Sessile polyps at cecum, 3 and 10 mm, larger polypectomy site endoclipped.  Sessile polyps x 3 (5 - 7 mm) at ascending. )Path: tubular adenomas without HGD). Severe sigmoid diverticulosis.  1/13 EGD, Dr Hilarie Fredrickson.  Normal study. 08/21/15 Capsule endoscopy, Dr Carlean Purl: normal study.  Left stomach at 37 m, left SB at 2 hours 8 minutes S/p 5 units PRBCs in 08/2015.  No additional PRBCs until current admission.  Treated with parenteral iron 04/2015 and 08/2015 and monthly Aranesp (last on. 01/30/16).  Hematologist Dr Irene Limbo feels "anemia is likely multifactorial ..  chronic kidney disease plus rheumatoid arthritis. .. allopurinol and plaquenil could certainly be an additional factors especially if there is worsening renal function. 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  Held 5 days due to epistaxis requiring nasal packing 11/2015.     Admitted progressive DOE for over 1 week and Hgb 4.5, had been 8.6 on 01/30/16.   Hgb up to 7.3 this morning after PRBC x 3.  Her stools had turned  from formed and brown to formed and black in last week or so. No anorexia, GI upset, nausea, abdominal pain.  Does see small amounts of dark blood in nasal discharge and increased nasal discharge since major nose bleed 11/2015.  No ETOH, NSAIDs or ASA.  No hematuria. No chest pain. Bruises easily but none are large.  No recent trauma. No sores or skin bleeding.  Breathing and weakness improved after transfusion.    Past Medical History  Diagnosis Date  . Hypertension   . Pulmonary HTN ([place])   . Aortic aneurysm ([place])   . Anemia 03/23/15    transfusion  . CHF (congestive heart failure) ([place])   . Gout   . History of blood transfusion "several"  . Heart murmur   . COPD (chronic obstructive pulmonary disease) ([place])   . On home oxygen therapy     "3L; 24/7" (02/20/2016)  . Tuberculosis     dormant carrier  . OSA on CPAP   . Lupus ([place])   . Arthritis     "qwhere" (02/20/2016)  . Rheumatoid arthritis(714.0) 11/06/2012  . Chronic lower back pain   . Anxiety   . CKD (chronic kidney disease) stage 3, GFR 30-59 ml/min     "on dialysis for 3 months after  my aneurysm in 2005" (02/20/2016)    Past Surgical History  Procedure Laterality Date  . Abdominal aortic aneurysm repair  2005  . Right heart catheterization N/A 08/18/2014    Procedure: RIGHT HEART CATH;  Surgeon: Jolaine Artist, MD;  [facility] C[facility] LAB;  Service: Cardiovascular;  Laterality: N/A;  . Tee without cardioversion N/A 01/05/2015    Procedure: TRANSESOPHAGEAL ECHOCARDIOGRAM (TEE);  Surgeon: Larey Dresser, MD;  Location: [facility];  Service: Cardiovascular;  Laterality: N/A;  . Wisdom tooth extraction    . Givens capsule study N/A 08/21/2015    Procedure: GIVENS CAPSULE STUDY;  Surgeon: Gatha Mayer, MD;  Location: [facility];  Service: Endoscopy;  Laterality: N/A;  . Esophagogastroduodenoscopy N/A 08/19/2015    Procedure: ESOPHAGOGASTRODUODENOSCOPY (EGD);  Surgeon: Jerene Bears, MD;  Location: [facility] ENDOSCOPY;  Service:  Endoscopy;  Laterality: N/A;  patient scheduled, anesthesia aware of 1500 case, per [place].  08/18/15 DP  . Colonoscopy N/A 08/19/2015    Procedure: COLONOSCOPY;  Surgeon: Jerene Bears, MD;  Location: [facility] ENDOSCOPY;  Service: Endoscopy;  Laterality: N/A;  . Cardiac catheterization N/A 10/24/2015    Procedure: Right Heart Cath;  Surgeon: Larey Dresser, MD;  Location: [facility] CV LAB;  Service: Cardiovascular;  Laterality: N/A;  . Hemorrhoid surgery    . Breast lumpectomy Right   . Cataract extraction w/ intraocular lens  implant, bilateral Bilateral 2016    Prior to Admission medications   Medication Sig Start Date End Date Taking? Authorizing Provider  acetaminophen (TYLENOL) 325 MG tablet Take 2 tablets (650 mg total) by mouth every 6 (six) hours as needed for mild pain (or Fever >/= 101). 08/25/15  Yes Albertine Patricia, MD  allopurinol (ZYLOPRIM) 300 MG tablet Take 300 mg by mouth daily.   Yes Historical Provider, MD  ALPRAZolam (XANAX) 0.25 MG tablet Take 0.25 mg by mouth 2 (two) times daily as needed for anxiety or sleep.    Yes Historical Provider, MD  apixaban (ELIQUIS) 5 MG TABS tablet Take 1 tablet (5 mg total) by mouth 2 (two) times daily. 10/24/15  Yes Larey Dresser, MD  atorvastatin (LIPITOR) 10 MG tablet Take 1 tablet (10 mg total) by mouth daily. 03/22/15  Yes Larey Dresser, MD  hydroxychloroquine (PLAQUENIL) 200 MG tablet Take 200 mg by mouth 2 (two) times daily.   Yes Historical Provider, MD  ketoconazole (NIZORAL) 2 % shampoo Apply 1 application topically as needed for irritation. Reported on 01/23/2016 12/15/14  Yes Historical Provider, MD  metoprolol succinate (TOPROL-XL) 25 MG 24 hr tablet Take 1 tablet (25 mg total) by mouth 2 (two) times daily. 09/05/15  Yes Larey Dresser, MD  Omega-3 Fatty Acids (FISH OIL) 1200 MG CAPS Take 1,200 mg by mouth daily.   Yes Historical Provider, MD  OXYGEN Inhale 3-6 L into the lungs continuous.    Yes Historical Provider, MD  potassium  chloride SA (KLOR-CON M20) 20 MEQ tablet Take 1 tablet (20 mEq total) by mouth daily. 09/12/15  Yes Larey Dresser, MD  torsemide (DEMADEX) 20 MG tablet Take 2 tablets (40 mg total) by mouth 2 (two) times daily. Patient taking differently: Take 40 mg by mouth 4 (four) times daily.  12/28/15  Yes Larey Dresser, MD    Scheduled Meds: . sodium chloride   Intravenous Once  . allopurinol  300 mg Oral Daily  . atorvastatin  10 mg Oral Daily  . hydroxychloroquine  200 mg Oral BID  .  potassium chloride SA  20 mEq Oral Daily  . torsemide  40 mg Oral BID   Infusions:   PRN Meds: acetaminophen **OR** acetaminophen, ALPRAZolam, ondansetron **OR** ondansetron (ZOFRAN) IV   Allergies as of 02/20/2016  . (No Known Allergies)    Family History  Problem Relation Age of Onset  . Heart attack Mother   . Prostate cancer Father   . Diabetes Brother   . Kidney failure Brother     Social History   Social History  . Marital Status: Legally Separated    Spouse Name: N/A  . Number of Children: 1  . Years of Education: N/A   Occupational History  . Disability     Office Work   Social History Main Topics  . Smoking status: Former Smoker -- 1.50 packs/day for 14 years    Types: Cigarettes    Quit date: 08/09/2003  . Smokeless tobacco: Never Used  . Alcohol Use: 0.0 oz/week    0 Standard drinks or equivalent per week     Comment: 02/20/2016 "drank a little in my teens"  . Drug Use: No  . Sexual Activity: No   Other Topics Concern  . Not on file   Social History Narrative    REVIEW OF SYSTEMS: Constitutional:  weakness ENT:  No nose bleeds Pulm:  Per HPI CV:  No palpitations, no LE edema.  No chest pain.  GU:  No hematuria, no frequency GI:  Per HPI Heme:  Per HPI   Transfusions:  Per HPI. Neuro:  No headaches, no peripheral tingling or numbness Derm:  No itching, no rash or sores.  Endocrine:  No sweats or chills.  No polyuria or dysuria Immunization:  Not questioned Travel:   None beyond local counties in last few months.    PHYSICAL EXAM: Vital signs in last 24 hours: Filed Vitals:   02/21/16 0530 02/21/16 0729  BP: 117/62 114/76  Pulse: 58 64  Temp: 97.9 F (36.6 C) 98.1 F (36.7 C)  Resp: 20 27   Wt Readings from Last 3 Encounters:  02/21/16 94.6 kg (208 lb 8.9 oz)  01/30/16 94.666 kg (208 lb 11.2 oz)  01/23/16 96.616 kg (213 lb)    General: pleasant, comfortable, looks chronically ill  Head:  No asymmetry or swelling  Eyes:  No icterus or pallor Ears:  Not HOH  Nose:  No discharge, sounds congested Mouth:  Clear and moist oral MM.   Neck:  No mass or JVD.  No TMG.   Lungs:  Greatly diminished BS but no adventitious sounds, no cough.  No dyspnea with speech.   Heart:  Irreg, irreg.  No tachy or brady. 3/6 systolic murmer  Abdomen:  Soft, active BS.  NT, ND.  No mass, no HSM, no bruits.  00.   Rectal: black, formed stool is not melenic, is 3+FOBT+.   Musc/Skeltl: no joint swelling or redness Extremities:  Non-pitting mild pedal edema bil. Neurologic:  Alert, good historian.  Oriented x 3.  A bit anxious.  Moves all 4s, strength not tested.  No gross deficits Skin:  No sores or large bruises.  Psych:  Cooperative, slightly anxious, fluent/non-tangential speech  Intake/Output from previous day: 07/17 0701 - 07/18 0700 In: 1354 [P.O.:360; I.V.:65; Blood:929] Out: 1000 [Urine:1000] Intake/Output this shift: Total I/O In: 120 [P.O.:120] Out: 200 [Urine:200]  LAB RESULTS:  Recent Labs  02/20/16 1540 02/21/16 0729  WBC 9.3 9.1  HGB 4.5* 7.3*  HCT 15.7* 23.2*  PLT 138* 126*  BMET Lab Results  Component Value Date   NA 139 02/21/2016   NA 141 02/20/2016   NA 143 01/30/2016   K 3.8 02/21/2016   K 3.9 02/20/2016   K 4.0 01/30/2016   CL 103 02/21/2016   CL 105 02/20/2016   CL 105 01/23/2016   CO2 30 02/21/2016   CO2 31 02/20/2016   CO2 30* 01/30/2016   GLUCOSE 105* 02/21/2016   GLUCOSE 117* 02/20/2016   GLUCOSE 139  01/30/2016   BUN 37* 02/21/2016   BUN 41* 02/20/2016   BUN 45.8* 01/30/2016   CREATININE 2.10* 02/21/2016   CREATININE 2.17* 02/20/2016   CREATININE 2.0* 01/30/2016   CALCIUM 9.2 02/21/2016   CALCIUM 9.2 02/20/2016   CALCIUM 10.5* 01/30/2016   LFT  Recent Labs  02/20/16 1540 02/21/16 0729  PROT 6.2* 6.2*  ALBUMIN 2.6* 2.8*  AST 15 14*  ALT 11* 11*  ALKPHOS 74 82  BILITOT 0.4 1.0   PT/INR Lab Results  Component Value Date   INR 2.24* 02/20/2016   INR 1.22 10/24/2015   INR 1.21 10/21/2015   Hepatitis Panel No results for input(s): HEPBSAG, HCVAB, HEPAIGM, HEPBIGM in the last 72 hours. C-Diff No components found for: CDIFF Lipase  No results found for: LIPASE  Drugs of Abuse  No results found for: LABOPIA, COCAINSCRNUR, LABBENZ, AMPHETMU, THCU, LABBARB   RADIOLOGY STUDIES: Dg Chest 2 View  02/20/2016  CLINICAL DATA:  Shortness breath, cough, weakness EXAM: CHEST  2 VIEW COMPARISON:  08/16/2015 FINDINGS: Chronic scarring in the right middle lung and bilateral lower lobes. No focal consolidation. No pleural effusion or pneumothorax. Cardiomegaly. Mild degenerative changes of the visualized thoracolumbar spine. Median sternotomy. IMPRESSION: No evidence of acute cardiopulmonary disease. Electronically Signed   By: Julian Hy M.D.   On: 02/20/2016 12:20    ENDOSCOPIC STUDIES: perHPI  IMPRESSION:   *  Acute on chronic anemia and FOBT + stools.  Anemia is multifactorial.   Extensive GI workup 08/2015 without obvious source for the severity of anemia.  Did have colon polyps which may have been responsible for FOBT +.    Recent onset of dark, formed stool.  BUN not elevated above baseline.  No PPI at home. GI ROS is unremarkable.  Suspect SB AVMs.     *  Chronic anticoagulation with Eliquis for AFB, discontinued 08/2015, restarted 10/2015, held 5 days due to epistaxis 11/2015.  Last dose was 7/16.  *  Inoperable mitral regurge.    *  CKD   PLAN:     *  Per Dr  Ardis Hughs, I scheduled enteroscopy for 10 AM 7/19.  Pt ate today.    *  ? Start empiric PPI, given no peptic dz in 08/2015 and negative GI ROS, will defer decision to Dr Ardis Hughs  *  Dr Johnsie Cancel just entered pt's room but plan is to "hold Eliquis indefinitely"    Azucena Freed  02/21/2016, 10:05 AM Pager: [phone]     ________________________________________________________________________  Velora Heckler GI MD note:  I personally examined the patient, reviewed the data and agree with the assessment and plan described above.  + melena, + anemia, FOBT + stool with Hb in 4s.  Certainly eliquis is contributing to the amount of bleeding that she's had.  I'm planning repeat upper endoscopy tomorrow.  Possible AVM, small ulcer that was not visible on previous exams.     Owens Loffler, MD [facility] Pager [phone]

## 2016-02-21 NOTE — Care Management Obs Status (Signed)
[facility] NOTIFICATION   Patient Details  Name: April Mueller MRN: [id number] Date of Birth: 1945-08-09   Medicare Observation Status Notification Given:  Yes    Zenon Mayo, RN 02/21/2016, 12:21 PM

## 2016-02-22 ENCOUNTER — Observation Stay (HOSPITAL_COMMUNITY): Payer: Medicare Other | Admitting: Certified Registered Nurse Anesthetist

## 2016-02-22 ENCOUNTER — Encounter (HOSPITAL_COMMUNITY)
Admission: EM | Disposition: A | Discharge status: Home / Self Care | Payer: Self-pay | Source: Home / Self Care | Attending: Emergency Medicine

## 2016-02-22 ENCOUNTER — Encounter (HOSPITAL_COMMUNITY): Payer: Self-pay | Admitting: *Deleted

## 2016-02-22 DIAGNOSIS — D631 Anemia in chronic kidney disease: Secondary | ICD-10-CM | POA: Diagnosis not present

## 2016-02-22 DIAGNOSIS — D649 Anemia, unspecified: Secondary | ICD-10-CM | POA: Diagnosis not present

## 2016-02-22 DIAGNOSIS — M069 Rheumatoid arthritis, unspecified: Secondary | ICD-10-CM | POA: Diagnosis not present

## 2016-02-22 DIAGNOSIS — K921 Melena: Secondary | ICD-10-CM | POA: Diagnosis not present

## 2016-02-22 DIAGNOSIS — D638 Anemia in other chronic diseases classified elsewhere: Secondary | ICD-10-CM | POA: Diagnosis not present

## 2016-02-22 DIAGNOSIS — N183 Chronic kidney disease, stage 3 (moderate): Secondary | ICD-10-CM

## 2016-02-22 DIAGNOSIS — G4733 Obstructive sleep apnea (adult) (pediatric): Secondary | ICD-10-CM | POA: Diagnosis not present

## 2016-02-22 DIAGNOSIS — J9611 Chronic respiratory failure with hypoxia: Secondary | ICD-10-CM | POA: Diagnosis not present

## 2016-02-22 DIAGNOSIS — D500 Iron deficiency anemia secondary to blood loss (chronic): Secondary | ICD-10-CM | POA: Diagnosis not present

## 2016-02-22 DIAGNOSIS — I481 Persistent atrial fibrillation: Secondary | ICD-10-CM | POA: Diagnosis not present

## 2016-02-22 DIAGNOSIS — I272 Other secondary pulmonary hypertension: Secondary | ICD-10-CM | POA: Diagnosis not present

## 2016-02-22 HISTORY — PX: ENTEROSCOPY: SHX5533

## 2016-02-22 LAB — CBC
HCT: 28.2 % — ABNORMAL LOW (ref 36.0–46.0)
HEMOGLOBIN: 9 g/dL — AB (ref 12.0–15.0)
MCH: 28.1 pg (ref 26.0–34.0)
MCHC: 31.9 g/dL (ref 30.0–36.0)
MCV: 88.1 fL (ref 78.0–100.0)
PLATELETS: 130 10*3/uL — AB (ref 150–400)
RBC: 3.2 MIL/uL — AB (ref 3.87–5.11)
RDW: 19.1 % — ABNORMAL HIGH (ref 11.5–15.5)
WBC: 11.7 10*3/uL — AB (ref 4.0–10.5)

## 2016-02-22 LAB — TYPE AND SCREEN
ABO/RH(D): O POS
ANTIBODY SCREEN: NEGATIVE
UNIT DIVISION: 0
Unit division: 0
Unit division: 0
Unit division: 0
Unit division: 0

## 2016-02-22 LAB — BASIC METABOLIC PANEL
ANION GAP: 6 (ref 5–15)
BUN: 31 mg/dL — ABNORMAL HIGH (ref 6–20)
CO2: 30 mmol/L (ref 22–32)
Calcium: 9.3 mg/dL (ref 8.9–10.3)
Chloride: 105 mmol/L (ref 101–111)
Creatinine, Ser: 2.07 mg/dL — ABNORMAL HIGH (ref 0.44–1.00)
GFR, EST AFRICAN AMERICAN: 27 mL/min — AB (ref >60)
GFR, EST NON AFRICAN AMERICAN: 23 mL/min — AB (ref >60)
GLUCOSE: 92 mg/dL (ref 65–99)
POTASSIUM: 3.6 mmol/L (ref 3.5–5.1)
Sodium: 141 mmol/L (ref 135–145)

## 2016-02-22 SURGERY — ENTEROSCOPY
Anesthesia: Monitor Anesthesia Care

## 2016-02-22 MED ORDER — FLUTICASONE PROPIONATE 50 MCG/ACT NA SUSP: 2.0000 | Freq: Every day | NASAL | Status: DC

## 2016-02-22 MED ORDER — PROPOFOL 500 MG/50ML IV EMUL
INTRAVENOUS | Status: DC | PRN
  Administered 2016-02-22: 75 ug/kg/min via INTRAVENOUS

## 2016-02-22 MED ORDER — PROPOFOL 10 MG/ML IV BOLUS
INTRAVENOUS | Status: DC | PRN
  Administered 2016-02-22: 30 mg via INTRAVENOUS

## 2016-02-22 MED ORDER — FLUTICASONE PROPIONATE 50 MCG/ACT NA SUSP
2.0000 | Freq: Every day | NASAL | Status: DC
  Administered 2016-02-22: 2 via NASAL
  Filled 2016-02-22 (×2): qty 16

## 2016-02-22 MED ORDER — LIDOCAINE HCL (CARDIAC) 20 MG/ML IV SOLN
INTRAVENOUS | Status: DC | PRN
  Administered 2016-02-22: 50 mg via INTRAVENOUS

## 2016-02-22 MED ORDER — DOXYCYCLINE HYCLATE 100 MG PO CAPS: 100.0000 mg | ORAL_CAPSULE | Freq: Two times a day (BID) | ORAL | Status: DC

## 2016-02-22 NOTE — Discharge Summary (Signed)
Physician Discharge Summary  April Mueller [id number] DOB: 1946-03-14 DOA: 02/20/2016  PCP:  Melinda Crutch, MD  Admit date: 02/20/2016 Discharge date: 02/22/2016  Recommendations for Outpatient Follow-up:  1. Pt will need to follow up with PCP in 2-3 weeks post discharge 2. Please obtain BMP to evaluate electrolytes and kidney function  Discharge Diagnoses:  Active Problems:   Secondary pulmonary hypertension (HCC)   Rheumatoid arthritis (HCC)   OSA on CPAP  Discharge Condition: Stable  Diet recommendation: Heart healthy diet discussed in details   History of present illness:  70 y.o. female, With history of chronic anemia, rheumatoid arthritis, chronic hypoxic respiratory failure, pulmonary hypertension, chronic diastolic heart failure, see daily, sleep apnea came to the hospital with upper respiratory symptoms of nasal congestion, cough and fatigue for past 1 week. Seen by hematologist on 01/30/2016 at that time hemoglobin was 8.6. As per hematology note patient has multifactorial anemia due to CKD/MDS/RA/RA.  In the ED level. Severe anemia with hemoglobin 4.5, 3 units PRBC ordered. Patient had similar presentation in January when she required 5 units PRBC. All the GI workup including endoscopy, colonoscopy and capsule endoscopy were negative.  Hospital Course:   1. Symptomatic anemia- patient presenting with hemoglobin of 4.5, with severe symptoms of anemia with shortness of breath. PRBC 3 units, ordered in the ED. S/P ECHO, normal exam per Dr. Ardis Hughs, recommended to stop [facility] indefinitely  2. Acute on chronic respiratory failure- patient has history of chronic diastolic CHF, now presenting with worsening of shortness of breath from anemia. Resolved with transfusion of blood, oxygen levels stable  3. Atrial fibrillation- CHADS2vasc score is 3, patient was started on Eliquis by cardiology. Now stooped indefinitely due to high risk bleed 4. History of pulmonary hypertension/right  heart failure- patient not a candidate for pulmonary vasodilators. 5. Obstructive sleep apnea- continue CPAP at bedtime 6. History of rheumatoid arthritis- continue Plaquenil 7. Chronic kidney disease stage III- stable at baseline.  Procedures/Studies: Dg Chest 2 View  02/20/2016  CLINICAL DATA:  Shortness breath, cough, weakness EXAM: CHEST  2 VIEW COMPARISON:  08/16/2015 FINDINGS: Chronic scarring in the right middle lung and bilateral lower lobes. No focal consolidation. No pleural effusion or pneumothorax. Cardiomegaly. Mild degenerative changes of the visualized thoracolumbar spine. Median sternotomy. IMPRESSION: No evidence of acute cardiopulmonary disease. Electronically Signed   By: Julian Hy M.D.   On: 02/20/2016 12:20    Discharge Exam: Filed Vitals:   02/22/16 1033 02/22/16 1045  BP: 96/35 119/77  Pulse: 74 69  Temp:    Resp: 11 30   Filed Vitals:   02/22/16 0904 02/22/16 0906 02/22/16 1033 02/22/16 1045  BP:  113/53 96/35 119/77  Pulse:   74 69  Temp: 98 F (36.7 C)     TempSrc: Oral     Resp:   11 30  Height:      Weight:      SpO2: 99%  93% 100%    General: Pt is alert, follows commands appropriately, not in acute distress Cardiovascular: Regular rate and rhythm, S1/S2 +, no murmurs, no rubs, no gallops Respiratory: Clear to auscultation bilaterally, no wheezing, no crackles, no rhonchi Abdominal: Soft, non tender, non distended, bowel sounds +, no guarding Extremities: no edema, no cyanosis, pulses palpable bilaterally DP and PT Neuro: Grossly nonfocal  Discharge Instructions     Medication List    STOP taking these medications        apixaban 5 MG Tabs tablet  Commonly known as:  ELIQUIS      TAKE these medications        acetaminophen 325 MG tablet  Commonly known as:  TYLENOL  Take 2 tablets (650 mg total) by mouth every 6 (six) hours as needed for mild pain (or Fever >/= 101).     ALPRAZolam 0.25 MG tablet  Commonly known as:  XANAX   Take 0.25 mg by mouth 2 (two) times daily as needed for anxiety or sleep.     atorvastatin 10 MG tablet  Commonly known as:  LIPITOR  Take 1 tablet (10 mg total) by mouth daily.     doxycycline 100 MG capsule  Commonly known as:  VIBRAMYCIN  Take 1 capsule (100 mg total) by mouth 2 (two) times daily.     Fish Oil 1200 MG Caps  Take 1,200 mg by mouth daily.     fluticasone 50 MCG/ACT nasal spray  Commonly known as:  FLONASE  Place 2 sprays into both nostrils daily.     hydroxychloroquine 200 MG tablet  Commonly known as:  PLAQUENIL  Take 200 mg by mouth 2 (two) times daily.     ketoconazole 2 % shampoo  Commonly known as:  NIZORAL  Apply 1 application topically as needed for irritation. Reported on 01/23/2016     metoprolol succinate 25 MG 24 hr tablet  Commonly known as:  TOPROL-XL  Take 1 tablet (25 mg total) by mouth 2 (two) times daily.     OXYGEN  Inhale 3-6 L into the lungs continuous.     potassium chloride SA 20 MEQ tablet  Commonly known as:  KLOR-CON M20  Take 1 tablet (20 mEq total) by mouth daily.     torsemide 20 MG tablet  Commonly known as:  DEMADEX  Take 2 tablets (40 mg total) by mouth 2 (two) times daily.     ZYLOPRIM 300 MG tablet  Generic drug:  allopurinol  Take 300 mg by mouth daily.            Follow-up Information    Follow up with  Melinda Crutch, MD.   Specialty:  [facility] Medicine   Contact information:   [place] [address] [phone]       Call Faye Ramsay, MD.   Specialty:  Internal Medicine   Why:  As needed   Contact information:   [place] [address] [phone]       Schedule an appointment as soon as possible for a visit with Thompson Grayer, MD.   Specialty:  Cardiology   Contact information:   [place] [address] [phone]        The results of significant diagnostics from this hospitalization (including imaging,  microbiology, ancillary and laboratory) are listed below for reference.     Microbiology: No results found for this or any previous visit (from the past 240 hour(s)).   Labs: Basic Metabolic Panel:  Recent Labs Lab 02/20/16 1540 02/21/16 0729 02/22/16 0434  NA 141 139 141  K 3.9 3.8 3.6  CL 105 103 105  CO2 31 30 30   GLUCOSE 117* 105* 92  BUN 41* 37* 31*  CREATININE 2.17* 2.10* 2.07*  CALCIUM 9.2 9.2 9.3   Liver Function Tests:  Recent Labs Lab 02/20/16 1540 02/21/16 0729  AST 15 14*  ALT 11* 11*  ALKPHOS 74 82  BILITOT 0.4 1.0  PROT 6.2* 6.2*  ALBUMIN 2.6* 2.8*   CBC:  Recent Labs Lab 02/20/16 1540 02/21/16 0729 02/21/16 2219 02/22/16 0434  WBC 9.3 9.1  --  11.7*  NEUTROABS 7.7  --   --   --   HGB 4.5* 7.3* 9.1* 9.0*  HCT 15.7* 23.2* 29.2* 28.2*  MCV 96.9 89.9  --  88.1  PLT 138* 126*  --  130*   Cardiac Enzymes:  Recent Labs Lab 02/20/16 1540 02/20/16 1852 02/21/16 0729  TROPONINI 0.03* 0.03* 0.03*   BNP: BNP (last 3 results)  Recent Labs  11/22/15 1049 01/23/16 1045 02/20/16 1540  BNP 198.0* 239.7* 316.5*     SIGNED: Time coordinating discharge: 30 minutes  Faye Ramsay, MD  [facility] 02/22/2016, 11:19 AM Pager [phone]  If 7PM-7AM, please contact night-coverage [email or web address] Password [email or web address]

## 2016-02-22 NOTE — Anesthesia Postprocedure Evaluation (Signed)
Anesthesia Post Note  Patient: April Mueller  Procedure(s) Performed: Procedure(s) (LRB): ENTEROSCOPY (N/A)  Patient location during evaluation: Endoscopy Anesthesia Type: MAC Level of consciousness: awake Pain management: pain level controlled Vital Signs Assessment: post-procedure vital signs reviewed and stable Respiratory status: spontaneous breathing and patient connected to nasal cannula oxygen Cardiovascular status: stable Postop Assessment: no signs of nausea or vomiting Anesthetic complications: no    Last Vitals:  Filed Vitals:   02/22/16 1033 02/22/16 1045  BP: 96/35 119/77  Pulse: 74 69  Temp:    Resp: 11 30    Last Pain:  Filed Vitals:   02/22/16 1047  PainSc: 0-No pain                 Ayesha Markwell

## 2016-02-22 NOTE — Transfer of Care (Signed)
Immediate Anesthesia Transfer of Care Note  Patient: April Mueller  Procedure(s) Performed: Procedure(s): ENTEROSCOPY (N/A)  Patient Location: Endoscopy Unit  Anesthesia Type:MAC  Level of Consciousness: awake, alert  and oriented  Airway & Oxygen Therapy: Patient Spontanous Breathing and Patient connected to nasal cannula oxygen  Post-op Assessment: Report given to RN, Post -op Vital signs reviewed and stable and Patient moving all extremities  Post vital signs: Reviewed and stable  Last Vitals:  Filed Vitals:   02/22/16 0906 02/22/16 1033  BP: 113/53 96/35  Pulse:  74  Temp:    Resp:  11    Last Pain:  Filed Vitals:   02/22/16 1035  PainSc: 0-No pain         Complications: No apparent anesthesia complications

## 2016-02-22 NOTE — Progress Notes (Signed)
Patient Name: April Mueller Date of Encounter: 02/22/2016  Active Problems:   Secondary pulmonary hypertension (HCC)   Rheumatoid arthritis ([place])   OSA on CPAP   Chronic respiratory failure with hypoxia (HCC)   Mitral regurgitation   Normocytic anemia   CKD (chronic kidney disease) stage 3, GFR 30-59 ml/min   Anemia of chronic disease   Symptomatic anemia   HTN (hypertension)   Myelodysplastic syndrome (HCC)   Anemia   Persistent atrial fibrillation [facility])   Primary Cardiologist: Dr. Aundra Dubin Patient Profile: 70 y.o. admitted with recurrent symptomatic anemia. Has chronic afib and has been on eliquis. Has seen Dr Rayann Heman after 2nd presumed GI bleed and thought not to be a candidate for Watchman. History of COPD, Pulmonary HTN and MR. Degree of MR has been noted to be severe in past and she was also apparently turned down for mitral clipping at [facility].   SUBJECTIVE: feels well, resting comfortably. Denies SOB and chest pain.    OBJECTIVE Filed Vitals:   02/21/16 1830 02/21/16 1920 02/21/16 2010 02/22/16 0417  BP: 120/61 122/64 125/50 118/67  Pulse: 69 89 70 59  Temp:  98.4 F (36.9 C) 98.4 F (36.9 C) 98.7 F (37.1 C)  TempSrc:  Oral Oral Oral  Resp: 27 28 36 24  Height:      Weight:    207 lb 0.2 oz (93.9 kg)  SpO2: 100% 100% 100% 100%    Intake/Output Summary (Last 24 hours) at 02/22/16 0735 Last data filed at 02/22/16 0600  Gross per 24 hour  Intake   1930 ml  Output   2066 ml  Net   -136 ml   Filed Weights   02/20/16 2100 02/21/16 0231 02/22/16 0417  Weight: 208 lb 11.2 oz (94.666 kg) 208 lb 8.9 oz (94.6 kg) 207 lb 0.2 oz (93.9 kg)    PHYSICAL EXAM General: Well developed, well nourished, female in no acute distress. Head: Normocephalic, atraumatic.  Neck: Supple without bruits, no JVD. Lungs:  Resp regular and unlabored, CTA. Heart: IRRR, S1, S2, no S3, S4, 2/6 systolic murmur; no rub. Abdomen: Soft, non-tender, non-distended, BS + x 4.    Extremities: No clubbing, cyanosis, No edema.  Neuro: Alert and oriented X 3. Moves all extremities spontaneously. Psych: Normal affect.  LABS: CBC: Recent Labs  02/20/16 1540 02/21/16 0729 02/21/16 2219 02/22/16 0434  WBC 9.3 9.1  --  11.7*  NEUTROABS 7.7  --   --   --   HGB 4.5* 7.3* 9.1* 9.0*  HCT 15.7* 23.2* 29.2* 28.2*  MCV 96.9 89.9  --  88.1  PLT 138* 126*  --  130*   INR: Recent Labs  02/20/16 1710  INR [id number]*   Basic Metabolic Panel: Recent Labs  02/21/16 0729 02/22/16 0434  NA 139 141  K 3.8 3.6  CL 103 105  CO2 30 30  GLUCOSE 105* 92  BUN 37* 31*  CREATININE 2.10* 2.07*  CALCIUM 9.2 9.3   Liver Function Tests: Recent Labs  02/20/16 1540 02/21/16 0729  AST 15 14*  ALT 11* 11*  ALKPHOS 74 82  BILITOT 0.4 1.0  PROT 6.2* 6.2*  ALBUMIN 2.6* 2.8*   Cardiac Enzymes: Recent Labs  02/20/16 1540 02/20/16 1852 02/21/16 0729  TROPONINI 0.03* 0.03* 0.03*   BNP:  B NATRIURETIC PEPTIDE  Date/Time Value Ref Range Status  02/20/2016 03:40 PM 316.5* 0.0 - 100.0 pg/mL Final  01/23/2016 10:45 AM 239.7* 0.0 - 100.0 pg/mL Final  Current facility-administered medications:  .  0.9 %  sodium chloride infusion, , Intravenous, Continuous, Vena Rua, PA-C, Last Rate: 20 mL/hr at 02/21/16 2055, 1,000 mL at 02/21/16 2055 .  acetaminophen (TYLENOL) tablet 650 mg, 650 mg, Oral, Q6H PRN **OR** acetaminophen (TYLENOL) suppository 650 mg, 650 mg, Rectal, Q6H PRN, Oswald Hillock, MD .  allopurinol (ZYLOPRIM) tablet 300 mg, 300 mg, Oral, Daily, Oswald Hillock, MD, 300 mg at 02/21/16 1100 .  ALPRAZolam Duanne Moron) tablet 0.25 mg, 0.25 mg, Oral, BID PRN, Oswald Hillock, MD, 0.25 mg at 02/21/16 2213 .  atorvastatin (LIPITOR) tablet 10 mg, 10 mg, Oral, Daily, Oswald Hillock, MD, 10 mg at 02/21/16 1100 .  hydroxychloroquine (PLAQUENIL) tablet 200 mg, 200 mg, Oral, BID, Oswald Hillock, MD, 200 mg at 02/21/16 2213 .  ondansetron (ZOFRAN) tablet 4 mg, 4 mg, Oral, Q6H PRN **OR**  ondansetron (ZOFRAN) injection 4 mg, 4 mg, Intravenous, Q6H PRN, Oswald Hillock, MD .  potassium chloride SA (K-DUR,KLOR-CON) CR tablet 20 mEq, 20 mEq, Oral, Daily, Oswald Hillock, MD, 20 mEq at 02/21/16 1100 .  torsemide (DEMADEX) tablet 40 mg, 40 mg, Oral, BID, Oswald Hillock, MD, 40 mg at 02/21/16 1840 . sodium chloride 1,000 mL (02/21/16 2055)    TELE:  Afib, rates 60-70's.         Radiology/Studies: Dg Chest 2 View  02/20/2016  CLINICAL DATA:  Shortness breath, cough, weakness EXAM: CHEST  2 VIEW COMPARISON:  08/16/2015 FINDINGS: Chronic scarring in the right middle lung and bilateral lower lobes. No focal consolidation. No pleural effusion or pneumothorax. Cardiomegaly. Mild degenerative changes of the visualized thoracolumbar spine. Median sternotomy. IMPRESSION: No evidence of acute cardiopulmonary disease. Electronically Signed   By: Julian Hy M.D.   On: 02/20/2016 12:20     Current Medications:  . allopurinol  300 mg Oral Daily  . atorvastatin  10 mg Oral Daily  . hydroxychloroquine  200 mg Oral BID  . potassium chloride SA  20 mEq Oral Daily  . torsemide  40 mg Oral BID   . sodium chloride 1,000 mL (02/21/16 2055)    ASSESSMENT AND PLAN: Active Problems:   Secondary pulmonary hypertension (HCC)   Rheumatoid arthritis (HCC)   OSA on CPAP   Chronic respiratory failure with hypoxia (HCC)   Mitral regurgitation   Normocytic anemia   CKD (chronic kidney disease) stage 3, GFR 30-59 ml/min   Anemia of chronic disease   Symptomatic anemia   HTN (hypertension)   Myelodysplastic syndrome (HCC)   Anemia   Persistent atrial fibrillation ([place])  1. Persistent atrial fibrillation: Rate controlled, not on any nodal blocking agents. Not on any anticoagulation, she has had 3 admissions for GI bleed with significant blood loss requiring transfusions. Dr. Johnsie Cancel to discuss re-evaluation for Watchman procedure.   2. Pulmonary HTN: Last right heart cath was in March 2017, PCWP was  optimized at that time on her current medical therapy.  3. 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 BP well controlled.  4. COPD: Followed by pulmonology outpatient. Stable, no wheezing.   5. Anemia: Received 2 units PRBC's yesterday, Hgb is 9 today.     Signed, Arbutus Leas , NP 7:35 AM 02/22/2016 Pager [phone]  Patient examined chart reviewed Discussed with Dr Aundra Dubin yesterday.  Agrees with no further eliquis Rx Will need to be re-evaluated by Dr Rayann Heman as outpatient for possible Watchman.  Hb up to 9 post transfusion  Jenkins Rouge

## 2016-02-22 NOTE — Discharge Instructions (Signed)
Anemia, Nonspecific Anemia is a condition in which the concentration of red blood cells or hemoglobin in the blood is below normal. Hemoglobin is a substance in red blood cells that carries oxygen to the tissues of the body. Anemia results in not enough oxygen reaching these tissues.  CAUSES  Common causes of anemia include:   Excessive bleeding. Bleeding may be internal or external. This includes excessive bleeding from periods (in women) or from the intestine.   Poor nutrition.   Chronic kidney, thyroid, and liver disease.  Bone marrow disorders that decrease red blood cell production.  Cancer and treatments for cancer.  HIV, AIDS, and their treatments.  Spleen problems that increase red blood cell destruction.  Blood disorders.  Excess destruction of red blood cells due to infection, medicines, and autoimmune disorders. SIGNS AND SYMPTOMS   Minor weakness.   Dizziness.   Headache.  Palpitations.   Shortness of breath, especially with exercise.   Paleness.  Cold sensitivity.  Indigestion.  Nausea.  Difficulty sleeping.  Difficulty concentrating. Symptoms may occur suddenly or they may develop slowly.  DIAGNOSIS  Additional blood tests are often needed. These help your health care provider determine the best treatment. Your health care provider will check your stool for blood and look for other causes of blood loss.  TREATMENT  Treatment varies depending on the cause of the anemia. Treatment can include:   Supplements of iron, vitamin B12, or folic acid.   Hormone medicines.   A blood transfusion. This may be needed if blood loss is severe.   Hospitalization. This may be needed if there is significant continual blood loss.   Dietary changes.  Spleen removal. HOME CARE INSTRUCTIONS Keep all follow-up appointments. It often takes many weeks to correct anemia, and having your health care provider check on your condition and your response to  treatment is very important. SEEK IMMEDIATE MEDICAL CARE IF:   You develop extreme weakness, shortness of breath, or chest pain.   You become dizzy or have trouble concentrating.  You develop heavy vaginal bleeding.   You develop a rash.   You have bloody or black, tarry stools.   You faint.   You vomit up blood.   You vomit repeatedly.   You have abdominal pain.  You have a fever or persistent symptoms for more than 2-3 days.   You have a fever and your symptoms suddenly get worse.   You are dehydrated.  MAKE SURE YOU:  Understand these instructions.  Will watch your condition.  Will get help right away if you are not doing well or get worse.   This information is not intended to replace advice given to you by your health care provider. Make sure you discuss any questions you have with your health care provider.   Document Released: 08/30/2004 Document Revised: 03/25/2013 Document Reviewed: 01/16/2013 Elsevier Interactive Patient Education 2016 [employer]  

## 2016-02-22 NOTE — Interval H&P Note (Signed)
History and Physical Interval Note:  02/22/2016 9:26 AM  April Mueller  has presented today for surgery, with the diagnosis of anemia, FOBT + dark stools.  The various methods of treatment have been discussed with the patient and family. After consideration of risks, benefits and other options for treatment, the patient has consented to  Procedure(s): ENTEROSCOPY (N/A) as a surgical intervention .  The patient's history has been reviewed, patient examined, no change in status, stable for surgery.  I have reviewed the patient's chart and labs.  Questions were answered to the patient's satisfaction.     Milus Banister

## 2016-02-22 NOTE — Op Note (Signed)
[facility] Patient Name: April Mueller Procedure Date : 02/22/2016 MRN: [id number] Attending MD: Milus Banister , MD Date of Birth: 1945/11/24 CSN: [id number] Age: 70 Admit Type: Inpatient Procedure:                Small bowel enteroscopy Indications:              Melena, amemia, extensive workup 3-4 months ago Providers:                Milus Banister, MD, Cleda Daub, RN, Cherylynn Ridges, Technician Referring MD:              Medicines:                Monitored Anesthesia Care Complications:            No immediate complications. Estimated blood loss:                            None. Estimated Blood Loss:     Estimated blood loss: none. Procedure:                Pre-Anesthesia Assessment:                           - Prior to the procedure, a History and Physical                            was performed, and patient medications and                            allergies were reviewed. The patient's tolerance of                            previous anesthesia was also reviewed. The risks                            and benefits of the procedure and the sedation                            options and risks were discussed with the patient.                            All questions were answered, and informed consent                            was obtained. Prior Anticoagulants: The patient has                            taken Eliquis (apixaban), last dose was 2 days                            prior to procedure. ASA Grade Assessment: III - A  patient with severe systemic disease. After                            reviewing the risks and benefits, the patient was                            deemed in satisfactory condition to undergo the                            procedure.                           After obtaining informed consent, the endoscope was                            passed under direct vision. Throughout the                           procedure, the patient's blood pressure, pulse, and                            oxygen saturations were monitored continuously. The                            [id number] scope was introduced through                            the mouth and advanced to the proximal jejunum. The                            small bowel enteroscopy was accomplished without                            difficulty. The patient tolerated the procedure                            well. Scope In: Scope Out: Findings:      The esophagus was normal.      The stomach was normal.      The examined duodenum was normal.      The examined jejunum was normal. Impression:               - Normal UGI examination. Recommendation:           - Return patient to hospital ward for ongoing care.                           - Agree with not restarting her anticoagulant given                            recurrent, severe, intermittent bleeding despite                            extensive GI workup. Procedure Code(s):        --- Professional ---  [phone], Small intestinal endoscopy, enteroscopy                            beyond second portion of duodenum, not including                            ileum; diagnostic, including collection of                            specimen(s) by brushing or washing, when performed                            (separate procedure) Diagnosis Code(s):        --- Professional ---                           K92.1, Melena (includes Hematochezia) CPT copyright 2016 [employer]. All rights reserved. The codes documented in this report are preliminary and upon coder review may  be revised to meet current compliance requirements. Milus Banister, MD 02/22/2016 10:29:59 AM This report has been signed electronically. Number of Addenda: 0

## 2016-02-22 NOTE — H&P (View-Only) (Signed)
[facility] Consult: 10:05 AM 02/21/2016     Referring Provider: Dr Darrick Meigs  Primary Care Physician:   Melinda Crutch, MD Primary Gastroenterologist:  Dr. Hilarie Fredrickson.     Reason for Consultation:  Anemia FOBT +.     HPI: April Mueller is a 70 y.o. female.  Multiple medical issues. CKD stage 3. OSA on CPAP. Mitral regurge (delined for TAVR in [place]). Pulm htn on home oxygen.  Obesity.  Diastolic heart failure.  S/p repair ascending aortic aneurysm. Rheumatoid arthritis. Hx epistaxis.  Started Eliquis 10/2015 for Afib.  Thrombocytopenia.  Previous GI work up for FOBT + anemia.  1/14 Colonoscopy for anemia/FOBT +/hx polyps, Dr Hilarie Fredrickson.  Sessile polyps at cecum, 3 and 10 mm, larger polypectomy site endoclipped.  Sessile polyps x 3 (5 - 7 mm) at ascending. )Path: tubular adenomas without HGD). Severe sigmoid diverticulosis.  1/13 EGD, Dr Hilarie Fredrickson.  Normal study. 08/21/15 Capsule endoscopy, Dr Carlean Purl: normal study.  Left stomach at 33 m, left SB at 2 hours 8 minutes S/p 5 units PRBCs in 08/2015.  No additional PRBCs until current admission.  Treated with parenteral iron 04/2015 and 08/2015 and monthly Aranesp (last on. 01/30/16).  Hematologist Dr Irene Limbo feels "anemia is likely multifactorial ..  chronic kidney disease plus rheumatoid arthritis. .. allopurinol and plaquenil could certainly be an additional factors especially if there is worsening renal function. 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  Held 5 days due to epistaxis requiring nasal packing 11/2015.     Admitted progressive DOE for over 1 week and Hgb 4.5, had been 8.6 on 01/30/16.   Hgb up to 7.3 this morning after PRBC x 3.  Her stools had turned  from formed and brown to formed and black in last week or so. No anorexia, GI upset, nausea, abdominal pain.  Does see small amounts of dark blood in nasal discharge and increased nasal discharge since major nose bleed 11/2015.  No ETOH, NSAIDs or ASA.  No hematuria. No chest pain. Bruises easily but none are large.  No recent trauma. No sores or skin bleeding.  Breathing and weakness improved after transfusion.    Past Medical History  Diagnosis Date  . Hypertension   . Pulmonary HTN ([place])   . Aortic aneurysm ([place])   . Anemia 03/23/15    transfusion  . CHF (congestive heart failure) ([place])   . Gout   . History of blood transfusion "several"  . Heart murmur   . COPD (chronic obstructive pulmonary disease) ([place])   . On home oxygen therapy     "3L; 24/7" (02/20/2016)  . Tuberculosis     dormant carrier  . OSA on CPAP   . Lupus ([place])   . Arthritis     "qwhere" (02/20/2016)  . Rheumatoid arthritis(714.0) 11/06/2012  . Chronic lower back pain   . Anxiety   . CKD (chronic kidney disease) stage 3, GFR 30-59 ml/min     "on dialysis for 3 months after  my aneurysm in 2005" (02/20/2016)    Past Surgical History  Procedure Laterality Date  . Abdominal aortic aneurysm repair  2005  . Right heart catheterization N/A 08/18/2014    Procedure: RIGHT HEART CATH;  Surgeon: Jolaine Artist, MD;  Location: [facility] CATH LAB;  Service: Cardiovascular;  Laterality: N/A;  . Tee without cardioversion N/A 01/05/2015    Procedure: TRANSESOPHAGEAL ECHOCARDIOGRAM (TEE);  Surgeon: Larey Dresser, MD;  Location: [facility];  Service: Cardiovascular;  Laterality: N/A;  . Wisdom tooth extraction    . Givens capsule study N/A 08/21/2015    Procedure: GIVENS CAPSULE STUDY;  Surgeon: Gatha Mayer, MD;  Location: [facility];  Service: Endoscopy;  Laterality: N/A;  . Esophagogastroduodenoscopy N/A 08/19/2015    Procedure: ESOPHAGOGASTRODUODENOSCOPY (EGD);  Surgeon: Jerene Bears, MD;  Location: [facility] ENDOSCOPY;  Service:  Endoscopy;  Laterality: N/A;  patient scheduled, anesthesia aware of 1500 case, per [place].  08/18/15 DP  . Colonoscopy N/A 08/19/2015    Procedure: COLONOSCOPY;  Surgeon: Jerene Bears, MD;  Location: [facility] ENDOSCOPY;  Service: Endoscopy;  Laterality: N/A;  . Cardiac catheterization N/A 10/24/2015    Procedure: Right Heart Cath;  Surgeon: Larey Dresser, MD;  Location: [facility] CV LAB;  Service: Cardiovascular;  Laterality: N/A;  . Hemorrhoid surgery    . Breast lumpectomy Right   . Cataract extraction w/ intraocular lens  implant, bilateral Bilateral 2016    Prior to Admission medications   Medication Sig Start Date End Date Taking? Authorizing Provider  acetaminophen (TYLENOL) 325 MG tablet Take 2 tablets (650 mg total) by mouth every 6 (six) hours as needed for mild pain (or Fever >/= 101). 08/25/15  Yes Albertine Patricia, MD  allopurinol (ZYLOPRIM) 300 MG tablet Take 300 mg by mouth daily.   Yes Historical Provider, MD  ALPRAZolam (XANAX) 0.25 MG tablet Take 0.25 mg by mouth 2 (two) times daily as needed for anxiety or sleep.    Yes Historical Provider, MD  apixaban (ELIQUIS) 5 MG TABS tablet Take 1 tablet (5 mg total) by mouth 2 (two) times daily. 10/24/15  Yes Larey Dresser, MD  atorvastatin (LIPITOR) 10 MG tablet Take 1 tablet (10 mg total) by mouth daily. 03/22/15  Yes Larey Dresser, MD  hydroxychloroquine (PLAQUENIL) 200 MG tablet Take 200 mg by mouth 2 (two) times daily.   Yes Historical Provider, MD  ketoconazole (NIZORAL) 2 % shampoo Apply 1 application topically as needed for irritation. Reported on 01/23/2016 12/15/14  Yes Historical Provider, MD  metoprolol succinate (TOPROL-XL) 25 MG 24 hr tablet Take 1 tablet (25 mg total) by mouth 2 (two) times daily. 09/05/15  Yes Larey Dresser, MD  Omega-3 Fatty Acids (FISH OIL) 1200 MG CAPS Take 1,200 mg by mouth daily.   Yes Historical Provider, MD  OXYGEN Inhale 3-6 L into the lungs continuous.    Yes Historical Provider, MD  potassium  chloride SA (KLOR-CON M20) 20 MEQ tablet Take 1 tablet (20 mEq total) by mouth daily. 09/12/15  Yes Larey Dresser, MD  torsemide (DEMADEX) 20 MG tablet Take 2 tablets (40 mg total) by mouth 2 (two) times daily. Patient taking differently: Take 40 mg by mouth 4 (four) times daily.  12/28/15  Yes Larey Dresser, MD    Scheduled Meds: . sodium chloride   Intravenous Once  . allopurinol  300 mg Oral Daily  . atorvastatin  10 mg Oral Daily  . hydroxychloroquine  200 mg Oral BID  .  potassium chloride SA  20 mEq Oral Daily  . torsemide  40 mg Oral BID   Infusions:   PRN Meds: acetaminophen **OR** acetaminophen, ALPRAZolam, ondansetron **OR** ondansetron (ZOFRAN) IV   Allergies as of 02/20/2016  . (No Known Allergies)    Family History  Problem Relation Age of Onset  . Heart attack Mother   . Prostate cancer Father   . Diabetes Brother   . Kidney failure Brother     Social History   Social History  . Marital Status: Legally Separated    Spouse Name: N/A  . Number of Children: 1  . Years of Education: N/A   Occupational History  . Disability     Office Work   Social History Main Topics  . Smoking status: Former Smoker -- 1.50 packs/day for 14 years    Types: Cigarettes    Quit date: 08/09/2003  . Smokeless tobacco: Never Used  . Alcohol Use: 0.0 oz/week    0 Standard drinks or equivalent per week     Comment: 02/20/2016 "drank a little in my teens"  . Drug Use: No  . Sexual Activity: No   Other Topics Concern  . Not on file   Social History Narrative    REVIEW OF SYSTEMS: Constitutional:  weakness ENT:  No nose bleeds Pulm:  Per HPI CV:  No palpitations, no LE edema.  No chest pain.  GU:  No hematuria, no frequency GI:  Per HPI Heme:  Per HPI   Transfusions:  Per HPI. Neuro:  No headaches, no peripheral tingling or numbness Derm:  No itching, no rash or sores.  Endocrine:  No sweats or chills.  No polyuria or dysuria Immunization:  Not questioned Travel:   None beyond local counties in last few months.    PHYSICAL EXAM: Vital signs in last 24 hours: Filed Vitals:   02/21/16 0530 02/21/16 0729  BP: 117/62 114/76  Pulse: 58 64  Temp: 97.9 F (36.6 C) 98.1 F (36.7 C)  Resp: 20 27   Wt Readings from Last 3 Encounters:  02/21/16 94.6 kg (208 lb 8.9 oz)  01/30/16 94.666 kg (208 lb 11.2 oz)  01/23/16 96.616 kg (213 lb)    General: pleasant, comfortable, looks chronically ill  Head:  No asymmetry or swelling  Eyes:  No icterus or pallor Ears:  Not HOH  Nose:  No discharge, sounds congested Mouth:  Clear and moist oral MM.   Neck:  No mass or JVD.  No TMG.   Lungs:  Greatly diminished BS but no adventitious sounds, no cough.  No dyspnea with speech.   Heart:  Irreg, irreg.  No tachy or brady. 3/6 systolic murmer  Abdomen:  Soft, active BS.  NT, ND.  No mass, no HSM, no bruits.  00.   Rectal: black, formed stool is not melenic, is 3+FOBT+.   Musc/Skeltl: no joint swelling or redness Extremities:  Non-pitting mild pedal edema bil. Neurologic:  Alert, good historian.  Oriented x 3.  A bit anxious.  Moves all 4s, strength not tested.  No gross deficits Skin:  No sores or large bruises.  Psych:  Cooperative, slightly anxious, fluent/non-tangential speech  Intake/Output from previous day: 07/17 0701 - 07/18 0700 In: 1354 [P.O.:360; I.V.:65; Blood:929] Out: 1000 [Urine:1000] Intake/Output this shift: Total I/O In: 120 [P.O.:120] Out: 200 [Urine:200]  LAB RESULTS:  Recent Labs  02/20/16 1540 02/21/16 0729  WBC 9.3 9.1  HGB 4.5* 7.3*  HCT 15.7* 23.2*  PLT 138* 126*  BMET Lab Results  Component Value Date   NA 139 02/21/2016   NA 141 02/20/2016   NA 143 01/30/2016   K 3.8 02/21/2016   K 3.9 02/20/2016   K 4.0 01/30/2016   CL 103 02/21/2016   CL 105 02/20/2016   CL 105 01/23/2016   CO2 30 02/21/2016   CO2 31 02/20/2016   CO2 30* 01/30/2016   GLUCOSE 105* 02/21/2016   GLUCOSE 117* 02/20/2016   GLUCOSE 139  01/30/2016   BUN 37* 02/21/2016   BUN 41* 02/20/2016   BUN 45.8* 01/30/2016   CREATININE 2.10* 02/21/2016   CREATININE 2.17* 02/20/2016   CREATININE 2.0* 01/30/2016   CALCIUM 9.2 02/21/2016   CALCIUM 9.2 02/20/2016   CALCIUM 10.5* 01/30/2016   LFT  Recent Labs  02/20/16 1540 02/21/16 0729  PROT 6.2* 6.2*  ALBUMIN 2.6* 2.8*  AST 15 14*  ALT 11* 11*  ALKPHOS 74 82  BILITOT 0.4 1.0   PT/INR Lab Results  Component Value Date   INR 2.24* 02/20/2016   INR 1.22 10/24/2015   INR 1.21 10/21/2015   Hepatitis Panel No results for input(s): HEPBSAG, HCVAB, HEPAIGM, HEPBIGM in the last 72 hours. C-Diff No components found for: CDIFF Lipase  No results found for: LIPASE  Drugs of Abuse  No results found for: LABOPIA, COCAINSCRNUR, LABBENZ, AMPHETMU, THCU, LABBARB   RADIOLOGY STUDIES: Dg Chest 2 View  02/20/2016  CLINICAL DATA:  Shortness breath, cough, weakness EXAM: CHEST  2 VIEW COMPARISON:  08/16/2015 FINDINGS: Chronic scarring in the right middle lung and bilateral lower lobes. No focal consolidation. No pleural effusion or pneumothorax. Cardiomegaly. Mild degenerative changes of the visualized thoracolumbar spine. Median sternotomy. IMPRESSION: No evidence of acute cardiopulmonary disease. Electronically Signed   By: Julian Hy M.D.   On: 02/20/2016 12:20    ENDOSCOPIC STUDIES: perHPI  IMPRESSION:   *  Acute on chronic anemia and FOBT + stools.  Anemia is multifactorial.   Extensive GI workup 08/2015 without obvious source for the severity of anemia.  Did have colon polyps which may have been responsible for FOBT +.    Recent onset of dark, formed stool.  BUN not elevated above baseline.  No PPI at home. GI ROS is unremarkable.  Suspect SB AVMs.     *  Chronic anticoagulation with Eliquis for AFB, discontinued 08/2015, restarted 10/2015, held 5 days due to epistaxis 11/2015.  Last dose was 7/16.  *  Inoperable mitral regurge.    *  CKD   PLAN:     *  Per Dr  Ardis Hughs, I scheduled enteroscopy for 10 AM 7/19.  Pt ate today.    *  ? Start empiric PPI, given no peptic dz in 08/2015 and negative GI ROS, will defer decision to Dr Ardis Hughs  *  Dr Johnsie Cancel just entered pt's room but plan is to "hold Eliquis indefinitely"    Azucena Freed  02/21/2016, 10:05 AM Pager: [phone]     ________________________________________________________________________  Velora Heckler GI MD note:  I personally examined the patient, reviewed the data and agree with the assessment and plan described above.  + melena, + anemia, FOBT + stool with Hb in 4s.  Certainly eliquis is contributing to the amount of bleeding that she's had.  I'm planning repeat upper endoscopy tomorrow.  Possible AVM, small ulcer that was not visible on previous exams.     Owens Loffler, MD [facility] Pager [phone]

## 2016-02-22 NOTE — Anesthesia Procedure Notes (Signed)
Procedure Name: MAC Date/Time: 02/22/2016 10:02 AM Performed by: Trixie Deis A Pre-anesthesia Checklist: Patient identified, Emergency Drugs available, Patient being monitored, Suction available and Timeout performed Oxygen Delivery Method: Nasal cannula Placement Confirmation: positive ETCO2 Dental Injury: Teeth and Oropharynx as per pre-operative assessment

## 2016-02-23 ENCOUNTER — Encounter (HOSPITAL_COMMUNITY): Payer: Self-pay | Admitting: Gastroenterology

## 2016-02-24 NOTE — Anesthesia Preprocedure Evaluation (Signed)
Anesthesia Evaluation  Patient identified by MRN, date of birth, ID band Patient awake    Reviewed: Allergy & Precautions, NPO status , Patient's Chart, lab work & pertinent test results  Airway Mallampati: II  TM Distance: >3 FB Neck ROM: Full    Dental  (+) Dental Advisory Given   Pulmonary shortness of breath, sleep apnea , COPD, former smoker,    breath sounds clear to auscultation       Cardiovascular hypertension, Pt. on medications + Peripheral Vascular Disease and +CHF   Rhythm:Regular     Neuro/Psych PSYCHIATRIC DISORDERS Anxiety negative neurological ROS     GI/Hepatic negative GI ROS, Neg liver ROS,   Endo/Other    Renal/GU Renal InsufficiencyRenal disease     Musculoskeletal  (+) Arthritis ,   Abdominal   Peds  Hematology  (+) anemia ,   Anesthesia Other Findings   Reproductive/Obstetrics                             Anesthesia Physical Anesthesia Plan  ASA: III  Anesthesia Plan: MAC   Post-op Pain Management:    Induction: Intravenous  Airway Management Planned: Natural Airway, Nasal Cannula and Simple Face Mask  Additional Equipment: None  Intra-op Plan:   Post-operative Plan:   Informed Consent: I have reviewed the patients History and Physical, chart, labs and discussed the procedure including the risks, benefits and alternatives for the proposed anesthesia with the patient or authorized representative who has indicated his/her understanding and acceptance.   Dental advisory given  Plan Discussed with: CRNA and Surgeon  Anesthesia Plan Comments:         Anesthesia Quick Evaluation

## 2016-02-27 ENCOUNTER — Other Ambulatory Visit (HOSPITAL_BASED_OUTPATIENT_CLINIC_OR_DEPARTMENT_OTHER): Payer: Medicare Other

## 2016-02-27 ENCOUNTER — Ambulatory Visit (HOSPITAL_BASED_OUTPATIENT_CLINIC_OR_DEPARTMENT_OTHER): Payer: Medicare Other

## 2016-02-27 VITALS — BP 109/55 | HR 55 | Temp 98.4°F | Resp 22

## 2016-02-27 DIAGNOSIS — D638 Anemia in other chronic diseases classified elsewhere: Secondary | ICD-10-CM

## 2016-02-27 DIAGNOSIS — D469 Myelodysplastic syndrome, unspecified: Secondary | ICD-10-CM

## 2016-02-27 DIAGNOSIS — D631 Anemia in chronic kidney disease: Secondary | ICD-10-CM | POA: Diagnosis not present

## 2016-02-27 DIAGNOSIS — D508 Other iron deficiency anemias: Secondary | ICD-10-CM | POA: Diagnosis not present

## 2016-02-27 DIAGNOSIS — N189 Chronic kidney disease, unspecified: Secondary | ICD-10-CM | POA: Diagnosis not present

## 2016-02-27 DIAGNOSIS — D500 Iron deficiency anemia secondary to blood loss (chronic): Secondary | ICD-10-CM

## 2016-02-27 LAB — CBC & DIFF AND RETIC
BASO%: 0.2 % (ref 0.0–2.0)
BASOS ABS: 0 10*3/uL (ref 0.0–0.1)
EOS ABS: 0 10*3/uL (ref 0.0–0.5)
EOS%: 0 % (ref 0.0–7.0)
HEMATOCRIT: 32.5 % — AB (ref 34.8–46.6)
HEMOGLOBIN: 9.9 g/dL — AB (ref 11.6–15.9)
LYMPH%: 9.6 % — AB (ref 14.0–49.7)
MCH: 28 pg (ref 25.1–34.0)
MCHC: 30.5 g/dL — AB (ref 31.5–36.0)
MCV: 91.8 fL (ref 79.5–101.0)
MONO#: 0.7 10*3/uL (ref 0.1–0.9)
MONO%: 6.5 % (ref 0.0–14.0)
NEUT#: 8.4 10*3/uL — ABNORMAL HIGH (ref 1.5–6.5)
NEUT%: 83.7 % — ABNORMAL HIGH (ref 38.4–76.8)
PLATELETS: 150 10*3/uL (ref 145–400)
RBC: 3.54 10*6/uL — ABNORMAL LOW (ref 3.70–5.45)
RDW: 16.7 % — ABNORMAL HIGH (ref 11.2–14.5)
WBC: 10.1 10*3/uL (ref 3.9–10.3)
lymph#: 1 10*3/uL (ref 0.9–3.3)
nRBC: 0 % (ref 0–0)

## 2016-02-27 LAB — COMPREHENSIVE METABOLIC PANEL
ALBUMIN: 2.8 g/dL — AB (ref 3.5–5.0)
ALK PHOS: 100 U/L (ref 40–150)
ALT: 11 U/L (ref 0–55)
ANION GAP: 10 meq/L (ref 3–11)
AST: 15 U/L (ref 5–34)
BUN: 30.9 mg/dL — ABNORMAL HIGH (ref 7.0–26.0)
CALCIUM: 10.3 mg/dL (ref 8.4–10.4)
CHLORIDE: 104 meq/L (ref 98–109)
CO2: 30 mEq/L — ABNORMAL HIGH (ref 22–29)
CREATININE: 2 mg/dL — AB (ref 0.6–1.1)
EGFR: 29 mL/min/{1.73_m2} — ABNORMAL LOW (ref >90)
Glucose: 122 mg/dl (ref 70–140)
Potassium: 3.7 mEq/L (ref 3.5–5.1)
Sodium: 144 mEq/L (ref 136–145)
Total Bilirubin: 0.68 mg/dL (ref 0.20–1.20)
Total Protein: 7.3 g/dL (ref 6.4–8.3)

## 2016-02-27 LAB — FERRITIN: FERRITIN: 78 ng/mL (ref 9–269)

## 2016-02-27 MED ORDER — DARBEPOETIN ALFA 100 MCG/0.5ML IJ SOSY
50.0000 ug | PREFILLED_SYRINGE | Freq: Once | INTRAMUSCULAR | Status: AC
  Administered 2016-02-27: 50 ug via SUBCUTANEOUS
  Filled 2016-02-27: qty 0.5

## 2016-02-28 LAB — RETICULOCYTES: Reticulocyte Count: 2.1 % (ref 0.6–2.6)

## 2016-03-01 LAB — MULTIPLE MYELOMA PANEL, SERUM
ALBUMIN/GLOB SERPL: 0.8 (ref 0.7–1.7)
ALPHA 1: 0.4 g/dL (ref 0.0–0.4)
ALPHA2 GLOB SERPL ELPH-MCNC: 0.6 g/dL (ref 0.4–1.0)
Albumin SerPl Elph-Mcnc: 2.9 g/dL (ref 2.9–4.4)
B-Globulin SerPl Elph-Mcnc: 1 g/dL (ref 0.7–1.3)
GAMMA GLOB SERPL ELPH-MCNC: 1.9 g/dL — AB (ref 0.4–1.8)
GLOBULIN, TOTAL: 3.8 g/dL (ref 2.2–3.9)
IgA, Qn, Serum: 443 mg/dL — ABNORMAL HIGH (ref 87–352)
IgM, Qn, Serum: 105 mg/dL (ref 26–217)
Total Protein: 6.7 g/dL (ref 6.0–8.5)

## 2016-03-14 ENCOUNTER — Other Ambulatory Visit (HOSPITAL_COMMUNITY): Payer: Self-pay | Admitting: Cardiology

## 2016-03-14 DIAGNOSIS — I158 Other secondary hypertension: Secondary | ICD-10-CM

## 2016-03-14 DIAGNOSIS — I5022 Chronic systolic (congestive) heart failure: Secondary | ICD-10-CM

## 2016-03-19 ENCOUNTER — Telehealth (HOSPITAL_COMMUNITY): Payer: Self-pay | Admitting: Pharmacist

## 2016-03-19 ENCOUNTER — Other Ambulatory Visit (HOSPITAL_COMMUNITY): Payer: Self-pay | Admitting: *Deleted

## 2016-03-19 MED ORDER — POTASSIUM CHLORIDE CRYS ER 20 MEQ PO TBCR
20.0000 meq | EXTENDED_RELEASE_TABLET | Freq: Every day | ORAL | 3 refills | Status: DC
Start: 2016-03-19 — End: 2016-03-26

## 2016-03-19 NOTE — Telephone Encounter (Signed)
[facility] mail order pharmacy needed clarification on directions for KCl Rx before they could fill it. Spoke with pharmacy technician and relayed directions (20 meq daily). Will be filled today.   [name]. Velva Harman, PharmD, BCPS, CPP Clinical Pharmacist Pager: [phone] Phone: [phone] 03/19/2016 11:47 AM

## 2016-03-26 ENCOUNTER — Encounter (HOSPITAL_COMMUNITY): Payer: Self-pay

## 2016-03-26 ENCOUNTER — Ambulatory Visit (HOSPITAL_COMMUNITY)
Admission: RE | Admit: 2016-03-26 | Discharge: 2016-03-26 | Disposition: A | Discharge status: Home / Self Care | Payer: Medicare Other | Source: Ambulatory Visit | Attending: Cardiology | Admitting: Cardiology

## 2016-03-26 VITALS — BP 118/62 | HR 65 | Wt 210.0 lb

## 2016-03-26 DIAGNOSIS — Z87891 Personal history of nicotine dependence: Secondary | ICD-10-CM | POA: Diagnosis not present

## 2016-03-26 DIAGNOSIS — J9611 Chronic respiratory failure with hypoxia: Secondary | ICD-10-CM

## 2016-03-26 DIAGNOSIS — D649 Anemia, unspecified: Secondary | ICD-10-CM | POA: Diagnosis not present

## 2016-03-26 DIAGNOSIS — Z6836 Body mass index (BMI) 36.0-36.9, adult: Secondary | ICD-10-CM | POA: Insufficient documentation

## 2016-03-26 DIAGNOSIS — J449 Chronic obstructive pulmonary disease, unspecified: Secondary | ICD-10-CM | POA: Diagnosis not present

## 2016-03-26 DIAGNOSIS — I5032 Chronic diastolic (congestive) heart failure: Secondary | ICD-10-CM | POA: Insufficient documentation

## 2016-03-26 DIAGNOSIS — G4733 Obstructive sleep apnea (adult) (pediatric): Secondary | ICD-10-CM | POA: Diagnosis not present

## 2016-03-26 DIAGNOSIS — I481 Persistent atrial fibrillation: Secondary | ICD-10-CM | POA: Insufficient documentation

## 2016-03-26 DIAGNOSIS — M069 Rheumatoid arthritis, unspecified: Secondary | ICD-10-CM | POA: Insufficient documentation

## 2016-03-26 DIAGNOSIS — Z79899 Other long term (current) drug therapy: Secondary | ICD-10-CM | POA: Insufficient documentation

## 2016-03-26 DIAGNOSIS — I272 Other secondary pulmonary hypertension: Secondary | ICD-10-CM | POA: Diagnosis not present

## 2016-03-26 DIAGNOSIS — J9610 Chronic respiratory failure, unspecified whether with hypoxia or hypercapnia: Secondary | ICD-10-CM | POA: Insufficient documentation

## 2016-03-26 DIAGNOSIS — I7101 Dissection of thoracic aorta: Secondary | ICD-10-CM | POA: Insufficient documentation

## 2016-03-26 DIAGNOSIS — N183 Chronic kidney disease, stage 3 unspecified: Secondary | ICD-10-CM

## 2016-03-26 DIAGNOSIS — I340 Nonrheumatic mitral (valve) insufficiency: Secondary | ICD-10-CM | POA: Diagnosis not present

## 2016-03-26 DIAGNOSIS — Z9989 Dependence on other enabling machines and devices: Secondary | ICD-10-CM

## 2016-03-26 DIAGNOSIS — Z833 Family history of diabetes mellitus: Secondary | ICD-10-CM | POA: Insufficient documentation

## 2016-03-26 DIAGNOSIS — Z8249 Family history of ischemic heart disease and other diseases of the circulatory system: Secondary | ICD-10-CM | POA: Insufficient documentation

## 2016-03-26 DIAGNOSIS — Z9981 Dependence on supplemental oxygen: Secondary | ICD-10-CM | POA: Insufficient documentation

## 2016-03-26 DIAGNOSIS — E669 Obesity, unspecified: Secondary | ICD-10-CM | POA: Diagnosis not present

## 2016-03-26 MED ORDER — POTASSIUM CHLORIDE CRYS ER 20 MEQ PO TBCR: 20.0000 meq | EXTENDED_RELEASE_TABLET | Freq: Two times a day (BID) | ORAL | 3 refills | Status: DC

## 2016-03-26 MED ORDER — TORSEMIDE 20 MG PO TABS: 60.0000 mg | ORAL_TABLET | Freq: Two times a day (BID) | ORAL | 3 refills | Status: DC

## 2016-03-26 NOTE — Progress Notes (Signed)
Patient ID: April Mueller, female   DOB: Mar 01, 1946, 70 y.o.   MRN: [id number]   HPI  PCP: Dr Harrington Challenger Pulmonary: Dr Byrum/Dr Melvyn Novas  Cardiology: Dr. Aundra Dubin  April Mueller is a 70 yo with COPD on home oxygen, rheumatoid arthritis, chronic atrial fibrillation, chronic diastolic CHF, CKD.    Moved back from [place] and saw Dr. Lamonte Sakai. In [place] was followed by cardiology and pulmonary. Started on Tracleer in 2006. Revatio was added, however was discontinued without any change in her symptoms. Stopped smoking in January 2005 when she had aortic aneurysm repair. Smoked 1.5-2 ppd x 15 - 20 years.  Later on macitentan.  I had her do a TEE in 6/16.  She had a loud MR murmur.  TEE showed very eccentric, posteriorly-directed MR that may be from subtle prolapse of the anterior mitral leaflet.  I am concerned that the MR could be severe but very difficult to fully visualize.  She additionally had PFTs done that were abnormal, but according to her pulmonologist Melvyn Novas), the obstruction was really only minimal and the restriction was primarily related to body habitus.  He does not think that we can explain her hypoxemia and dyspnea primarily from parenchymal lung pathology.  I sent her to [facility] for evaluation for percutaneous mitral valve clip.  They did not think that she was a good candidate.   She has been anemic and received 1 unit PRBCs in 8/16.  She saw GI and was noted to be heme negative, no scopes were done (heme negative and high risk). She has had iron infusions.   Opsumit was stopped in 9/16 due to suspicion for group 2 pulmonary hypertension only (low PVR) and she felt better off it.   She was admitted in 1/17 with increased dyspnea and was found to be profoundly anemic.  She had a total of 5 units PRBCs and an iron infusion in the hospital.  Stool was FOBT+, melena.  EGD, colonoscopy, and capsule endoscopy were all done without clear source of blood loss.  While in the hospital, she was noted to go into atrial  fibrillation with RVR.  She initially required amiodarone for rate control, but was transitioned over to Toprol XL. She was not anticoagulated due to concern for GI blood loss and anemia.  Anticoagulation was attempted later.   RHC in 3/17 showed elevated right heart filling pressures and mildly elevated PCWP.  There was mild pulmonary hypertension, likely pulmonary venous hypertension with PVR 1.9 WU.   She was admitted in 7/17 with symptomatic anemia, received 3 units PRBCs.  Anticoagulation was stopped.  Small bowel enteroscopy did not show a source of bleeding.    She returns for followup today.  She remains in atrial fibrillation.  Weight is down 3 lbs, but she has increased peripheral edema. She is short of breath after walking 75-100 yards.  No BRBPR or melena.  No chest pain.  Stable orthopnea.  Using CPAP at night and oxygen during the day. She was seen by Dr Rayann Heman, he recommended against Watchman. She is now off anticoagulation.   Labs 02/05/13 K 3.8 Creatinine 1.37 05/08/13 K 3.8, Creatinine 1.58 11/14 K 4.1, creatinine 1.82 08/09/2014: K 4.0 Creatinine 1.45  5/16: K 3.7, creatinine 1.76, BNP 447 6/16: K 5 => 4.6, creatinine 2.09 => 2.13, BNP 291 8/16: K 4.7, creatinine 2.1, HCT 30.7 9/16: K 4.5, creatinine 2.3, HCT 34.1 1/17: K 4.8, creatinine 1.84 => 1.87, HCT 29.7, plts 136, FOBT+, BNP 403 2/17: K 3.5,  creatinine 1.89, BNP 329, hgb 10 3/17: K 3.8, creatinine 2.5 => 2.3, HCT 35.4 => 33.5 5/17: K 3.7, creatinine 2.0, hgb 10.6 7/17: K 3.7, creatinine 2, hgb 9.9  PMH:  1) COPD       --former smoker (28 pk-yrs)       --on home O2 - 4L/min rest, up to 5L/min exertion.        --spirometry 6/13 in [place] = 0.85L FVC = 1.3L no DLCO measured. FEV1 in 2014 = 0.73        --CT chest 10/14 - moderate centrilobular emphysema. No pulm fibrosis but there was evidence for post-infectious/inflammatory scarring. Stable Asc Ao repair       --PFTs (11/14) FVC 46%, FEV1 0.8L (40%), ratio 87%, FEF  25-75% 0.44L DLCO 22%, TLC 50% => PFTs in 06/2013 did NOT suggest significant airflow obstruction according to Dr Gustavus Bryant last note despite "moderate emphysema" on CT.        --PFTs (6/16) were abnormal with severe obstruction, severe restriction, severely decreased DLCO.  Reviewed with Dr Melvyn Novas => he thinks obstruction is actually fairly minimal and that the restriction is due to body habitus.   2) Obesity 3) RA       --on plaquenil, sees Dr Lenna Gilford.  4) Type A aortic dissection s/p repair 2005     --c/b renal failure requiring HD x 3 months 5) PAH - previously treated in [place].  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      --on macitentan (revatio stopped 10/14).     --V/Q scan 11/14 no chronic PE     -- 2015 Tracleer stopped and started on macitentan. 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      -- RHC 08/18/2014: No role for pulmonary vasodilators.  RA = 14 RV = 62/3/15 PA = 63/21 (38) PCW = mean 23 v waves to 40 Fick cardiac output/index = 6.3/3.2 Thermodilution CO/CI = 6.1/3.1, PVR = 2.0 WU, FA sat = 91% PA sat = 57%, 62%     -- RHC 3/17: mean RA 13, PA 44/2 mean 29, mean PCWP 17, CI 3.04, PVR 1.9 WU => pulmonary venous hypertension. 6) OSA 7) Diastolic CHF    --ECHO [id number] EF 55-60% RV mildly dilated with normal function. Mild to moderate posterior MR. RVSP 56. Probable restrictive diastolic filling pattern    --ECHO 9/15 EF 60-65% RV mildly dilated with normal function. Mild to moderate posterior MR. RVSP 40. Ao Root ok    --TEE 6/16 with EF 55-60%, D-shaped interventricular septum suggestive of RV pressure/volume overload, RV mildly dilated with normal systolic function, peak RV-RA gradient 57 mmHg, very eccentric posteriorly-directed mitral regurgitation, possibly severe, etiology may be a very small area of prolapse on the anterior leaflet, s/p ascending aorta repair with residual dissection flap in the descending thoracic aorta.       --Echo (1/17) with EF 55-60%,  mild to moderate MR, PASP 66 8) CKD 9) Mitral regurgitation: Possibly severe by 6/16 TEE, very eccentric.  Not good candidate for percutaneous MV clip (seen at [facility]).  Echo (1/17) with only mild to moderate MR.  10) Anemia: Suspect from chronic disease/renal disease and well as chronic GI bleeding.  EGD/colonoscopy/capsule endoscopy in 1/17 did not show any definite site for blood loss.  Small bowel enteroscopy in 7/17 did not show etiology of bleeding. 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  11) Atrial fibrillation: Persistent, 1st noted in 1/17.   ROS: All systems reviewed and negative except as  per HPI.  Current Outpatient Prescriptions on File Prior to Encounter  Medication Sig Dispense Refill  . acetaminophen (TYLENOL) 325 MG tablet Take 2 tablets (650 mg total) by mouth every 6 (six) hours as needed for mild pain (or Fever >/= 101).    [name] allopurinol (ZYLOPRIM) 300 MG tablet Take 300 mg by mouth daily.    [name] ALPRAZolam (XANAX) 0.25 MG tablet Take 0.25 mg by mouth 2 (two) times daily as needed for anxiety or sleep.     [name] atorvastatin (LIPITOR) 10 MG tablet TAKE 1 TABLET DAILY 90 tablet 2  . fluticasone (FLONASE) 50 MCG/ACT nasal spray Place 2 sprays into both nostrils daily. 16 g 2  . hydroxychloroquine (PLAQUENIL) 200 MG tablet Take 200 mg by mouth 2 (two) times daily.    . metoprolol succinate (TOPROL-XL) 25 MG 24 hr tablet TAKE 1 TABLET TWICE A DAY 180 tablet 0  . Omega-3 Fatty Acids (FISH OIL) 1200 MG CAPS Take 1,200 mg by mouth daily.    . OXYGEN Inhale 3-6 L into the lungs continuous.     . potassium chloride SA (KLOR-CON M20) 20 MEQ tablet Take 1 tablet (20 mEq total) by mouth daily. 90 tablet 3  . torsemide (DEMADEX) 20 MG tablet Take 2 tablets (40 mg total) by mouth 2 (two) times daily. 360 tablet 3  . ketoconazole (NIZORAL) 2 % shampoo Apply 1 application topically as needed for irritation. Reported on 01/23/2016  3  . potassium chloride SA (K-DUR,KLOR-CON) 20 MEQ tablet Take 1  tablet (20 mEq total) by mouth once. 90 tablet 2   No current facility-administered medications on file prior to encounter.    Social History   Social History  . Marital status: Legally Separated    Spouse name: N/A  . Number of children: 1  . Years of education: N/A   Occupational History  . Disability     Office Work   Social History Main Topics  . Smoking status: Former Smoker    Packs/day: 1.50    Years: 14.00    Types: Cigarettes    Quit date: 08/09/2003  . Smokeless tobacco: Never Used  . Alcohol use 0.0 oz/week     Comment: 02/20/2016 "drank a little in my teens"  . Drug use: No  . Sexual activity: No   Other Topics Concern  . None   Social History Narrative  . None   Family History  Problem Relation Age of Onset  . Heart attack Mother   . Prostate cancer Father   . Diabetes Brother   . Kidney failure Brother     Vitals:   03/26/16 1355  BP: 118/62  Pulse: 65  SpO2: 97%  Weight: 210 lb (95.3 kg)   Physical Exam Gen: Pleasant, well-nourished, in no distress, normal affect; on O2 by [place].  HEENT: normal Neck: JVP 8-9 cm with HJR. Lungs: CTAB  Cardiovascular: PMI normal. Irregular S1S2, no S3/S4, 2/6 HSM LLSB/apex. 1+ edema 3/4 to knees bilaterally. Extremities: No deformities, no cyanosis or clubbing.  Neuro: alert, non focal. Cranial nerves intact. Moves all 4 extremities without difficulty Skin: Warm, no lesions or rashes  Assessment/Plan: 1. 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  This appeared to be primarily pulmonary venous hypertension.  Interestingly, there were very prominent V waves in the PCWP tracing. On exam, she has a prominent MR murmur.  I was concerned that significant  MR may be playing a major role in her symptomatology. I did a TEE in 6/16 showing very eccentric, posteriorly-directed mitral regurgitation  possibly from subtle anterior leaflet prolapse.  I thought that the MR could be severe but was difficult to fully visualize.  She has significant COPD to help explain her hypoxemia and dyspnea. She was seen at [facility] for consideration of percutaneous MV clip and was not thought to be a good candidate => concern that MV disease may not actually be severe and concern that her dyspnea is coming from COPD and would not be improved by MV clipping.  Most recent echo in 1/17 showed normal LV EF and only visualized mild to moderate MR.  Last RHC in 3/17 showed mildly elevated filling pressures and pulmonary venous hypertension (mild).  Today, she has increased peripheral edema.  She is volume overloaded on exam.  Baseline dyspnea seems a bit worse.  - Increase torsemide to 60 mg bid and KCl to 20 bid.  BMET/BNP in 1 week.  2. Chronic respiratory failure:  She is on 4-5 L home oxygen by [place]. She has COPD and restriction from her body habitus.  3. OSA: Continue CPAP nightly.  4. CKD: Stage III-IV.  Repeat BMET today. 5. COPD: See #2 above, per pulmonary.     6. Aortic dissection s/p repair 2005: Although complete records are not available, it appears that the patient developed a Type A dissection in 2005 with ascending aorta repair (can see the surgically repaired ascending aorta well on TEE).  She has residual dissection in the descending thoracic aorta.  7. Pulmonary hypertension: This is primarily [place] group 2 (pulmonary venous hypertension) based on RHC in 3/17.  She is not a good candidate for pulmonary vasodilators.   8. Mitral regurgitation:  TEE concerning for possibly severe, eccentric mitral regurgitation, most likely due to subtle prolapse of the anterior mitral valve leaflet.  I have been concerned that the MR is playing a role in her CHF. She had prominent V-waves on RHC in 1/16, and she has a loud mitral area murmur.  I do not think that she is a good candidate for open MV surgery. I had her evaluated at [facility]  for possible percutaneous mitral clipping.  She was turned down for this as her MR was not clearly severe and fixing it may not help her hypoxemia appreciably.  Of note, most recent echo in 1/17 actually visualized only mild to moderate MR.   9. Anemia: Suspect anemia of chronic disease/renal disease but also with component of GI blood loss.  She has had GI bleeding with FOBT+, but unable to visualized bleeding source on EGD, colonoscopy, enteroscopy, or capsule endoscopy (most recently in 7/17).  Seeing hematology, getting Aranesp. Some concern now for possible myelodysplastic syndrome.  10. Atrial fibrillation: Persistent.  Rate is controlled.  CHADSVASC = 3.  She was evaluated for Watchman but decided not to be a good candidate. Due to recurrent severe GI bleeding, anticoagulation was stopped altogether.  Followup in 2 weeks   [name] 03/27/2016

## 2016-03-26 NOTE — Patient Instructions (Signed)
Increase Torsemide to 60mg  twice daily.  Take Potassium 17meq twice daily.  Have lab work drawn next week and faxed to [phone]  Follow up with Dr.McLean in 2 weeks

## 2016-03-30 ENCOUNTER — Ambulatory Visit (INDEPENDENT_AMBULATORY_CARE_PROVIDER_SITE_OTHER): Payer: Medicare Other | Admitting: Emergency Medicine

## 2016-03-30 ENCOUNTER — Encounter: Payer: Self-pay | Admitting: Emergency Medicine

## 2016-03-30 DIAGNOSIS — J9611 Chronic respiratory failure with hypoxia: Secondary | ICD-10-CM | POA: Diagnosis not present

## 2016-03-30 DIAGNOSIS — Z9989 Dependence on other enabling machines and devices: Secondary | ICD-10-CM

## 2016-03-30 DIAGNOSIS — I272 Other secondary pulmonary hypertension: Secondary | ICD-10-CM

## 2016-03-30 DIAGNOSIS — J449 Chronic obstructive pulmonary disease, unspecified: Secondary | ICD-10-CM | POA: Diagnosis not present

## 2016-03-30 DIAGNOSIS — G4733 Obstructive sleep apnea (adult) (pediatric): Secondary | ICD-10-CM

## 2016-03-30 DIAGNOSIS — IMO0002 Reserved for concepts with insufficient information to code with codable children: Secondary | ICD-10-CM

## 2016-03-30 NOTE — Assessment & Plan Note (Signed)
Not currently on bronchodilator therapy. She's been tried on multiple agents and has never clinically responded

## 2016-03-30 NOTE — Assessment & Plan Note (Signed)
Continue o2 at 3L/min rest, up to 6L/min exertion.

## 2016-03-30 NOTE — Patient Instructions (Addendum)
Please continue CPAP every night as you have been using it Please continue oxygen at 3-6 L/m Flu shot this fall Follow with Dr Lamonte Sakai in 6 months or sooner if you have any problems

## 2016-03-30 NOTE — Assessment & Plan Note (Signed)
-   continue CPAP qhs 

## 2016-03-30 NOTE — Assessment & Plan Note (Signed)
Motor factorial with contributions of her diastolic CHF, obstructive sleep apnea, mitral valve disease and almost certainly contribution of her COPD. At this time her efforts admitting made at maintaining adequate option a patient and treatment of her sleep apnea. Certainly optimization of her anemia will be a factor here as well. No targeted medications at this time

## 2016-03-30 NOTE — Progress Notes (Signed)
Subjective:    Patient ID: April Mueller, female    DOB: March 10, 1946, 70 y.o.   MRN: [id number]  HPI Initial Visit:  70 yo, former smoker (28 pk-yrs), hx of HTN, OSA on CPAP, Rheumatoid arthritis on plaquenil. Hx AAA 2005 c/b ARF and 3 months of HD (now off). 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 She moved back to [place] from [place] in 8/13, referred by Dr Melinda Crutch for [facility] that has been managed on bosentan 155m bid + sildenifil 243mtid, supplemental O2 4L/min rest, up to 5L/min exertion.   Follow up:  ROV 11/12/14 -- follow-up visit for severe secondary primary hypertension due to COPD, rheumatoid arthritis, and obstructive sleep apnea, most recently on macetentan now for 6 months. She underwent right heart catheterization in January '16.  Showed moderate secondary PAH, with normal PVR, . Suspect she does . She is on 6L/min at rest, adjusts for exercise at pulm rehab. She is scared to stop the macetentan. Her lasix was increased slightly.   ROV 09/16/15 -- follow-up visit for secondary pulmonary hypertension in the setting of diastolic CHF, possible mitral valve disease and mitral regurgitation, rheumatoid arthritis, and COPD. She has been evaluated by Dr McAundra Dubint cardiology - see his very detailed and thorough note.  She has been under evaluation for possible valve procedure, not scheduled.  Also considering watchman placement. She has been off macetentan and does not miss it. I have reevaluated her pulmonary function testing from 01/12/15 and also from 07/03/13. She has mixed obstruction and restriction on spirometry with an FEV1 of less than a liter. She has been on BD's but has never felt that any of these have helped her.  She is compliant with her CPAP - wears it every night.   I discussed with her that general anesthesia is high risk but that I do not think it absolutely contraindicated.   ROV 11/21/15 -- patient with a history of secondary pulmonary hypertension in this setting of  diastolic CHF, arthritis, COPD and obstructive sleep apnea. She also has mitral valvular disease > a repeat right heart cath was performed 10/24/15 after she had been diuresed. Her poor capillary which had improved from 23-17, her RV pressures improved to 49/12 (compared with 63/21 in jan 2016).She has been on opsumit and pulmonary vasodilators before, not currently. She probably feels better off of these medications. She is compliant with her O2 and her CPAP. She has documented significant obstructive lung disease but has never responded bronchodilators. At our last visit I tried her again on Anoro. She states that she did not really notice any changes in her breathing on the Anoro - it was difficult to tell because she also switched to torsemide at the same time. The cost of Anoro will be $200 a month until she reaches her meds deductible, then it will be $30 a month thereafter. She ran out last Thursday.   ROV 03/30/16 -- This is a follow-up visit for history of secondary pulmonary hypertension in the setting of diastolic CHF, COPD, obstructive sleep apnea, mitral valve disease. She has been treated with targeted [facility] before but is not currently on therapy. She has never truly benefited at least clinically from schedule bronchodilators. She has been hospitalized x 2 since last time for severe symptomatic anemia. She has been transfused, has received Fe infusions. Most recent hgb 02/27/16 was 9.9.  She has good compliance w her CPAP and o2. She benefits from her CPAP. She  is on plaquanil and has been for over 10 years, ? Related to this.    Review of Systems  Constitutional: Negative for fever and unexpected weight change.  HENT: Negative for congestion, dental problem, ear pain, nosebleeds, postnasal drip, rhinorrhea, sinus pressure, sneezing, sore throat and trouble swallowing.   Eyes: Negative for redness and itching.  Respiratory: Positive for shortness of breath. Negative for cough, chest  tightness and wheezing.   Cardiovascular: Positive for leg swelling. Negative for palpitations.  Gastrointestinal: Negative for diarrhea, nausea and vomiting.  Genitourinary: Negative for dysuria.  Musculoskeletal: Negative for joint swelling.  Skin: Negative for rash.  Neurological: Negative for headaches.  Hematological: Does not bruise/bleed easily.  Psychiatric/Behavioral: Negative for dysphoric mood. The patient is not nervous/anxious.     Hepatic Function Latest Ref Rng & Units 02/27/2016 02/27/2016 02/21/2016  Total Protein 6.0 - 8.5 g/dL 7.3 6.7 6.2(L)  Albumin 3.5 - 5.0 g/dL 2.8(L) - 2.8(L)  AST 5 - 34 U/L 15 - 14(L)  ALT 0 - 55 U/L 11 - 11(L)  Alk Phosphatase 40 - 150 U/L 100 - 82  Total Bilirubin 0.20 - 1.20 mg/dL 0.68 - 1.0  Bilirubin, Direct 0.0 - 0.3 mg/dL - - -    08/18/14 --  Findings:  RA = 14 RV = 62/3/15 PA = 63/21 (38) PCW = mean 23 v waves to 40 Fick cardiac output/index = 6.3/3.2 Thermodilution CO/CI = 6.1/3.1 PVR = 2.0 WU FA sat = 91% PA sat = 57%, 62%  Assessment: 1. Mild to moderate pulmonary HTN with normal PVR which suggests pulmonary venous HTN 2. Moderately elevated left-sided pressures in the setting of her not taking lasix this am 3. Normal cardiac output      Objective:   Physical Exam Vitals:   03/30/16 0857 03/30/16 0858  BP:  104/68  BP Location:  Left Arm  Cuff Size:  Normal  Pulse:  64  SpO2:  92%  Weight: 210 lb (95.3 kg)   Height: 5' 4"  (1.626 m)    Gen: Pleasant, well-nourished, in no distress,  normal affect  ENT: No lesions,  mouth clear,  oropharynx clear, no postnasal drip  Neck: No JVD, no TMG, no carotid bruits  Lungs: No use of accessory muscles, distant, clear without rales or rhonchi  Cardiovascular: RRR, heart sounds normal, no murmur or gallops, no peripheral edema  Musculoskeletal: No deformities, no cyanosis or clubbing, trace to 1+ ankle edema  Neuro: alert, non focal  Skin: Warm, no lesions or  rashes   Study Conclusions 12/17/12 --  - Left ventricle: The cavity size was normal. Wall thickness was normal. Systolic function was normal. The estimated ejection fraction was in the range of 55% to 60%. Wall motion was normal; there were no regional wall motion abnormalities. - Mitral valve: MR is eccentric and directed posteriorl somewhat hard to judge extent Mildly calcified annulus. Mildly thickened leaflets . Mild to moderate regurgitation. - Left atrium: The atrium was mildly dilated. - Right ventricle: The cavity size was mildly dilated. - Right atrium: The atrium was mildly dilated. - Atrial septum: The septum bowed from left to right, consistent with increased left atrial pressure. - Pulmonary arteries: PA peak pressure: 41m Hg (S).      Assessment & Plan:  Secondary pulmonary hypertension ([place] factorial with contributions of her diastolic CHF, obstructive sleep apnea, mitral valve disease and almost certainly contribution of her COPD. At this time her efforts admitting made at maintaining adequate option a patient  and treatment of her sleep apnea. Certainly optimization of her anemia will be a factor here as well. No targeted medications at this time  COPD Not currently on bronchodilator therapy. She's been tried on multiple agents and has never clinically responded  OSA on CPAP continue CPAP qhs  Chronic respiratory failure with hypoxia (HCC) Continue o2 at 3L/min rest, up to 6L/min exertion.   Baltazar Apo, MD, PhD 03/30/2016, 9:28 AM  Pulmonary and Critical Care [phone] or if no answer [phone]

## 2016-04-02 ENCOUNTER — Other Ambulatory Visit (HOSPITAL_BASED_OUTPATIENT_CLINIC_OR_DEPARTMENT_OTHER): Payer: Medicare Other

## 2016-04-02 ENCOUNTER — Other Ambulatory Visit: Payer: Self-pay | Admitting: Cardiology

## 2016-04-02 ENCOUNTER — Ambulatory Visit (HOSPITAL_BASED_OUTPATIENT_CLINIC_OR_DEPARTMENT_OTHER): Payer: Medicare Other | Admitting: Hematology

## 2016-04-02 ENCOUNTER — Ambulatory Visit: Payer: Medicare Other

## 2016-04-02 ENCOUNTER — Telehealth: Payer: Self-pay | Admitting: Hematology

## 2016-04-02 ENCOUNTER — Encounter: Payer: Self-pay | Admitting: Hematology

## 2016-04-02 VITALS — BP 95/59 | HR 87 | Temp 98.6°F | Resp 20 | Wt 208.1 lb

## 2016-04-02 DIAGNOSIS — D469 Myelodysplastic syndrome, unspecified: Secondary | ICD-10-CM

## 2016-04-02 DIAGNOSIS — D638 Anemia in other chronic diseases classified elsewhere: Secondary | ICD-10-CM | POA: Diagnosis not present

## 2016-04-02 DIAGNOSIS — N189 Chronic kidney disease, unspecified: Secondary | ICD-10-CM | POA: Diagnosis not present

## 2016-04-02 DIAGNOSIS — D500 Iron deficiency anemia secondary to blood loss (chronic): Secondary | ICD-10-CM

## 2016-04-02 DIAGNOSIS — M069 Rheumatoid arthritis, unspecified: Secondary | ICD-10-CM

## 2016-04-02 DIAGNOSIS — D649 Anemia, unspecified: Secondary | ICD-10-CM

## 2016-04-02 DIAGNOSIS — D696 Thrombocytopenia, unspecified: Secondary | ICD-10-CM

## 2016-04-02 LAB — CBC & DIFF AND RETIC
BASO%: 0.3 % (ref 0.0–2.0)
Basophils Absolute: 0 10*3/uL (ref 0.0–0.1)
EOS%: 1.3 % (ref 0.0–7.0)
Eosinophils Absolute: 0.1 10*3/uL (ref 0.0–0.5)
HCT: 31.3 % — ABNORMAL LOW (ref 34.8–46.6)
HGB: 9.2 g/dL — ABNORMAL LOW (ref 11.6–15.9)
Immature Retic Fract: 7.9 % (ref 1.60–10.00)
LYMPH%: 10.9 % — AB (ref 14.0–49.7)
MCH: 27.6 pg (ref 25.1–34.0)
MCHC: 29.4 g/dL — ABNORMAL LOW (ref 31.5–36.0)
MCV: 94 fL (ref 79.5–101.0)
MONO#: 0.4 10*3/uL (ref 0.1–0.9)
MONO%: 5.9 % (ref 0.0–14.0)
NEUT%: 81.6 % — ABNORMAL HIGH (ref 38.4–76.8)
NEUTROS ABS: 5.7 10*3/uL (ref 1.5–6.5)
Platelets: 115 10*3/uL — ABNORMAL LOW (ref 145–400)
RBC: 3.33 10*6/uL — AB (ref 3.70–5.45)
RDW: 18.3 % — ABNORMAL HIGH (ref 11.2–14.5)
Retic %: 1.19 % (ref 0.70–2.10)
Retic Ct Abs: 39.63 10*3/uL (ref 33.70–90.70)
WBC: 7 10*3/uL (ref 3.9–10.3)
lymph#: 0.8 10*3/uL — ABNORMAL LOW (ref 0.9–3.3)

## 2016-04-02 LAB — COMPREHENSIVE METABOLIC PANEL
ALK PHOS: 102 U/L (ref 40–150)
ALT: 10 U/L (ref 0–55)
AST: 15 U/L (ref 5–34)
Albumin: 2.9 g/dL — ABNORMAL LOW (ref 3.5–5.0)
Anion Gap: 10 mEq/L (ref 3–11)
BUN: 30.7 mg/dL — ABNORMAL HIGH (ref 7.0–26.0)
CO2: 29 meq/L (ref 22–29)
Calcium: 10.2 mg/dL (ref 8.4–10.4)
Chloride: 105 mEq/L (ref 98–109)
Creatinine: 1.9 mg/dL — ABNORMAL HIGH (ref 0.6–1.1)
EGFR: 30 mL/min/{1.73_m2} — AB (ref >90)
GLUCOSE: 113 mg/dL (ref 70–140)
POTASSIUM: 3.8 meq/L (ref 3.5–5.1)
SODIUM: 144 meq/L (ref 136–145)
Total Bilirubin: 0.4 mg/dL (ref 0.20–1.20)
Total Protein: 7.5 g/dL (ref 6.4–8.3)

## 2016-04-02 LAB — FERRITIN: Ferritin: 32 ng/ml (ref 9–269)

## 2016-04-02 MED ORDER — DARBEPOETIN ALFA 100 MCG/0.5ML IJ SOSY
50.0000 ug | PREFILLED_SYRINGE | Freq: Once | INTRAMUSCULAR | Status: AC
  Administered 2016-04-02: 50 ug via SUBCUTANEOUS
  Filled 2016-04-02: qty 0.5

## 2016-04-02 NOTE — Progress Notes (Signed)
April Mueller    HEMATOLOGY/ONCOLOGY CLINIC NOTE  Date of Service: .04/02/2016   Patient Care Team: Lona Kettle, MD as PCP - General (Family Medicine)  CHIEF COMPLAINTS/PURPOSE OF CONSULTATION: f/u for Anemia  Diagnosis: Normocytic Normochromic Anemia likely multifactorial - CKD/RA/GIB/MDS  Treatment: Aranesp + IV feraheme as needed  HISTORY OF PRESENTING ILLNESS: Please see my initial consultation for details on initial presentation.  INTERVAL HISTORY  April Mueller is here for her scheduled follow-up for her anemia. She was admitted the hospital for acute GI bleed in July2017 with a drop in hemoglobin to 4.5 requiring 5 units of PRBCs. Had an upper endoscopy that was unrevealing. Has been taken off her Eliquis by cardiology in the setting of major recurrent GI bleeds. Comes for her scheduled follow-up. Ferritin level on 02/27/2016 was 74. Has not noted any overt bleeding since her hospital discharge. Hemoglobin today is 9.1   MEDICAL HISTORY:  Past Medical History:  Diagnosis Date  . Anemia 03/23/15   transfusion  . Anxiety   . Aortic aneurysm ([place])   . Arthritis    "qwhere" (02/20/2016)  . CHF (congestive heart failure) ([place])   . Chronic lower back pain   . CKD (chronic kidney disease) stage 3, GFR 30-59 ml/min    "on dialysis for 3 months after my aneurysm in 2005" (02/20/2016)  . COPD (chronic obstructive pulmonary disease) ([place])   . Gout   . Heart murmur   . History of blood transfusion "several"  . Hypertension   . Lupus ([place])   . On home oxygen therapy    "3L; 24/7" (02/20/2016)  . OSA on CPAP   . Pulmonary HTN ([place])   . Rheumatoid arthritis(714.0) 11/06/2012  . 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 ABDOMINAL AORTIC ANEURYSM REPAIR  2005  . BREAST LUMPECTOMY Right   . CARDIAC CATHETERIZATION N/A 10/24/2015   Procedure: Right Heart Cath;  Surgeon: Larey Dresser, MD;  Location: [place] [facility];   Service: Cardiovascular;  Laterality: N/A;  . CATARACT EXTRACTION W/ INTRAOCULAR LENS  IMPLANT, BILATERAL Bilateral 2016  . COLONOSCOPY N/A 08/19/2015   Procedure: COLONOSCOPY;  Surgeon: Jerene Bears, MD;  Location[facility] ENDOSCOPY;  Service: Endoscopy;  Laterality: N/A;  . ENTEROSCOPY N/A 02/22/2016   Procedure: ENTEROSCOPY;  Surgeon: Milus Banister, MD;  Location: [place];  Service: Endoscopy;  Laterality: N/A;  . ESOPHAGOGASTRODUODENOSCOPY N/A 08/19/2015   Procedure: ESOPHAGOGASTRODUODENOSCOPY (EGD);  Surgeon: Jerene Bears, MD;  Location: [facility] ENDOSCOPY;  Service: Endoscopy;  Laterality: N/A;  patient scheduled, anesthesia aware of 1500 case, per [place].  08/18/15 DP  . GIVENS CAPSULE STUDY N/A 08/21/2015   Procedure: GIVENS CAPSULE STUDY;  Surgeon: Gatha Mayer, MD;  Location: [place];  Service: Endoscopy;  Laterality: N/A;  . HEMORRHOID SURGERY    . RIGHT HEART CATHETERIZATION N/A 08/18/2014   Procedure: RIGHT HEART CATH;  Surgeon: Jolaine Artist, MD;  Location: [facility] CATH LAB;  Service: Cardiovascular;  Laterality: N/A;  . TEE WITHOUT CARDIOVERSION N/A 01/05/2015   Procedure: TRANSESOPHAGEAL ECHOCARDIOGRAM (TEE);  Surgeon: Larey Dresser, MD;  Location: [place];  Service: Cardiovascular;  Laterality: N/A;  . WISDOM TOOTH EXTRACTION      SOCIAL HISTORY: Social History   Social History  . Marital status: Legally Separated    Spouse name: N/A  . Number of children: 1  . Years of education: N/A   Occupational History  . Disability  Office Work   Social History Main Topics  . Smoking status: Former Smoker    Packs/day: 1.50    Years: 14.00    Types: Cigarettes    Quit date: 08/09/2003  . Smokeless tobacco: Never Used  . Alcohol use 0.0 oz/week     Comment: 02/20/2016 "drank a little in my teens"  . Drug use: No  . Sexual activity: No   Other Topics Concern  . Not on file   Social History Narrative  . No narrative on file    FAMILY HISTORY: Family History    Problem Relation Age of Onset  . Heart attack Mother   . Prostate cancer Father   . Diabetes Brother   . Kidney failure Brother     ALLERGIES:  has No Known Allergies.  MEDICATIONS:  Current Outpatient Prescriptions  Medication Sig Dispense Refill  . acetaminophen (TYLENOL) 325 MG tablet Take 2 tablets (650 mg total) by mouth every 6 (six) hours as needed for mild pain (or Fever >/= 101).    April Mueller allopurinol (ZYLOPRIM) 300 MG tablet Take 300 mg by mouth daily.    April Mueller ALPRAZolam (XANAX) 0.25 MG tablet Take 0.25 mg by mouth 2 (two) times daily as needed for anxiety or sleep.     April Mueller atorvastatin (LIPITOR) 10 MG tablet TAKE 1 TABLET DAILY 90 tablet 2  . fluticasone (FLONASE) 50 MCG/ACT nasal spray Place 2 sprays into both nostrils daily. 16 g 2  . hydroxychloroquine (PLAQUENIL) 200 MG tablet Take 200 mg by mouth 2 (two) times daily.    . metoprolol succinate (TOPROL-XL) 25 MG 24 hr tablet TAKE 1 TABLET TWICE A DAY 180 tablet 0  . Omega-3 Fatty Acids (FISH OIL) 1200 MG CAPS Take 1,200 mg by mouth daily.    . OXYGEN Inhale 3-6 L into the lungs continuous.     . potassium chloride SA (KLOR-CON M20) 20 MEQ tablet Take 1 tablet (20 mEq total) by mouth 2 (two) times daily. 90 tablet 3  . torsemide (DEMADEX) 20 MG tablet Take 3 tablets (60 mg total) by mouth 2 (two) times daily. 360 tablet 3   No current facility-administered medications for this visit.     REVIEW OF SYSTEMS:    10 Point review of Systems was done is negative except as noted above.  PHYSICAL EXAMINATION: ECOG PERFORMANCE STATUS: 3 - Symptomatic, >50% confined to bed  . Vitals:   04/02/16 0942  Weight: 208 lb 1.6 oz (94.4 kg)   Filed Weights   04/02/16 0942  Weight: 208 lb 1.6 oz (94.4 kg)   .Body mass index is 35.72 kg/m.  GENERAL:alert, in no acute distress and comfortable on [place] oxygen SKIN: skin color, texture, turgor are normal, no rashes or significant lesions EYES: normal, conjunctiva are pink and non-injected,  sclera clear OROPHARYNX:no exudate, no erythema and lips, buccal mucosa, and tongue normal  NECK: supple, no JVD, thyroid normal size, non-tender, without nodularity LYMPH:  no palpable lymphadenopathy in the cervical, axillary or inguinal LUNGS: clear to auscultation with normal respiratory effort HEART: regular rate & rhythm,  no murmurs and no lower extremity edema ABDOMEN: abdomen soft, non-tender, normoactive bowel sounds  Musculoskeletal: no cyanosis of digits and no clubbing  PSYCH: alert & oriented x 3 with fluent speech NEURO: no focal motor/sensory deficits  LABORATORY DATA:  I have reviewed the data as listed  . CBC Latest Ref Rng & Units 04/02/2016 02/27/2016 02/22/2016  WBC 3.9 - 10.3 10e3/uL 7.0 10.1 11.7(H)  Hemoglobin  11.6 - 15.9 g/dL 9.2(L) 9.9(L) 9.0(L)  Hematocrit 34.8 - 46.6 % 31.3(L) 32.5(L) 28.2(L)  Platelets 145 - 400 10e3/uL 115(L) 150 130(L)   . CBC    Component Value Date/Time   WBC 7.0 04/02/2016 0909   WBC 11.7 (H) 02/22/2016 0434   RBC 3.33 (L) 04/02/2016 0909   RBC 3.20 (L) 02/22/2016 0434   HGB 9.2 (L) 04/02/2016 0909   HCT 31.3 (L) 04/02/2016 0909   PLT 115 (L) 04/02/2016 0909   MCV 94.0 04/02/2016 0909   MCH 27.6 04/02/2016 0909   MCH 28.1 02/22/2016 0434   MCHC 29.4 (L) 04/02/2016 0909   MCHC 31.9 02/22/2016 0434   RDW 18.3 (H) 04/02/2016 0909   LYMPHSABS 0.8 (L) 04/02/2016 0909   MONOABS 0.4 04/02/2016 0909   EOSABS 0.1 04/02/2016 0909   BASOSABS 0.0 04/02/2016 0909      . CMP Latest Ref Rng & Units 04/02/2016 02/27/2016 02/27/2016  Glucose 70 - 140 mg/dl 113 122 -  BUN 7.0 - 26.0 mg/dL 30.7(H) 30.9(H) -  Creatinine 0.6 - 1.1 mg/dL 1.9(H) 2.0(H) -  Sodium 136 - 145 mEq/L 144 144 -  Potassium 3.5 - 5.1 mEq/L 3.8 3.7 -  Chloride 101 - 111 mmol/L - - -  CO2 22 - 29 mEq/L 29 30(H) -  Calcium 8.4 - 10.4 mg/dL 10.2 10.3 -  Total Protein 6.4 - 8.3 g/dL 7.5 7.3 6.7  Total Bilirubin 0.20 - 1.20 mg/dL 0.40 0.68 -  Alkaline Phos 40 - 150 U/L  102 100 -  AST 5 - 34 U/L 15 15 -  ALT 0 - 55 U/L 10 11 -   .  Lab Results  Component Value Date   FERRITIN 78 02/27/2016   Ferritin levels from today are currently pending.  RADIOGRAPHIC STUDIES: I have personally reviewed the radiological images as listed and agreed with the findings in the report. No results found.  ASSESSMENT & PLAN:   70 year old female with multiple medical comorbidities including hypertension, COPD, CHF, gout, rheumatoid arthritis, moderate mitral regurgitation with  #1 Normocytic Normochromic anemia.-Her anemia is likely multifactorial from her chronic kidney disease plus rheumatoid arthritis. Her allopurinol and plaquenil could certainly be an additional factors especially if there is worsening renal function. 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 Also has intermittent acute on chronic GI bleeds while on anticoagulation with her Eliquis with the last one being in July 2017 requiring 5 units of PRBCs. She is now off her anticoagulation. Ferritin level was 74.  SPEP with no monoclonal protein. No clinical evidence of splenomegaly to suggest overt hypersplenism. Hemoglobin stable at 9.2 after recent hospitalization in July .  #2 Mild thrombocytopenia -stable  #2 rheumatoid arthritis #3 chronic kidney disease #4 pulmonary hypertension #5 CHF  Plan -hgb today is stable at 9.2 -Patient may proceed for her monthly Aranesp today. -We'll set her up for IV Feraheme 100 mg every weekly 2 doses to maintain ferritin 100. It was 74 on last check. -Might need to increase Aranesp dose as needed to target hemoglobin around 10-11 and hold for hemoglobin more than 11.  -Continue optimal treatment of rheumatoid arthritis -Continue follow-up with primary care physician for other medical cares.  Return to care in 1 month for her next Aranesp shot and monthly labs IV Feraheme daily 2 doses   Return to care with Dr. Irene Limbo  in 2 month and monthly labs  CBC, CMP and ferritin  All of the patients questions were answered  to her apparent satisfaction. The patient knows to call the clinic with any problems, questions or concerns.  I spent 20 minutes counseling the patient face to face. The total time spent in the appointment was 25 minutes and more than 50% was on counseling and direct patient cares and reviewed her extensive inpatient records and results    Sullivan Lone MD [place] AAHIVMS [facility] Hematology/Oncology Physician [facility]  (Office):       [phone] (Work cell):  [phone] (Fax):           [phone]

## 2016-04-02 NOTE — Patient Instructions (Signed)
Darbepoetin Alfa injection What is this medicine? DARBEPOETIN ALFA (dar be POE e tin AL fa) helps your body make more red blood cells. It is used to treat anemia caused by chronic kidney failure and chemotherapy. This medicine may be used for other purposes; ask your health care provider or pharmacist if you have questions. What should I tell my health care provider before I take this medicine? They need to know if you have any of these conditions: -blood clotting disorders or history of blood clots -cancer patient not on chemotherapy -cystic fibrosis -heart disease, such as angina, heart failure, or a history of a heart attack -hemoglobin level of 12 g/dL or greater -high blood pressure -low levels of folate, iron, or vitamin B12 -seizures -an unusual or allergic reaction to darbepoetin, erythropoietin, albumin, hamster proteins, latex, other medicines, foods, dyes, or preservatives -pregnant or trying to get pregnant -breast-feeding How should I use this medicine? This medicine is for injection into a vein or under the skin. It is usually given by a health care professional in a hospital or clinic setting. If you get this medicine at home, you will be taught how to prepare and give this medicine. Do not shake the solution before you withdraw a dose. Use exactly as directed. Take your medicine at regular intervals. Do not take your medicine more often than directed. It is important that you put your used needles and syringes in a special sharps container. Do not put them in a trash can. If you do not have a sharps container, call your pharmacist or healthcare provider to get one. Talk to your pediatrician regarding the use of this medicine in children. While this medicine may be used in children as young as 1 year for selected conditions, precautions do apply. Overdosage: If you think you have taken too much of this medicine contact a poison control center or emergency room at once. NOTE:  This medicine is only for you. Do not share this medicine with others. What if I miss a dose? If you miss a dose, take it as soon as you can. If it is almost time for your next dose, take only that dose. Do not take double or extra doses. What may interact with this medicine? Do not take this medicine with any of the following medications: -epoetin alfa This list may not describe all possible interactions. Give your health care provider a list of all the medicines, herbs, non-prescription drugs, or dietary supplements you use. Also tell them if you smoke, drink alcohol, or use illegal drugs. Some items may interact with your medicine. What should I watch for while using this medicine? Visit your prescriber or health care professional for regular checks on your progress and for the needed blood tests and blood pressure measurements. It is especially important for the doctor to make sure your hemoglobin level is in the desired range, to limit the risk of potential side effects and to give you the best benefit. Keep all appointments for any recommended tests. Check your blood pressure as directed. Ask your doctor what your blood pressure should be and when you should contact him or her. As your body makes more red blood cells, you may need to take iron, folic acid, or vitamin B supplements. Ask your doctor or health care provider which products are right for you. If you have kidney disease continue dietary restrictions, even though this medication can make you feel better. Talk with your doctor or health care professional about the   foods you eat and the vitamins that you take. What side effects may I notice from receiving this medicine? Side effects that you should report to your doctor or health care professional as soon as possible: -allergic reactions like skin rash, itching or hives, swelling of the face, lips, or tongue -breathing problems -changes in vision -chest pain -confusion, trouble speaking  or understanding -feeling faint or lightheaded, falls -high blood pressure -muscle aches or pains -pain, swelling, warmth in the leg -rapid weight gain -severe headaches -sudden numbness or weakness of the face, arm or leg -trouble walking, dizziness, loss of balance or coordination -seizures (convulsions) -swelling of the ankles, feet, hands -unusually weak or tired Side effects that usually do not require medical attention (report to your doctor or health care professional if they continue or are bothersome): -diarrhea -fever, chills (flu-like symptoms) -headaches -nausea, vomiting -redness, stinging, or swelling at site where injected This list may not describe all possible side effects. Call your doctor for medical advice about side effects. You may report side effects to FDA at 1-800-FDA-1088. Where should I keep my medicine? Keep out of the reach of children. Store in a refrigerator between 2 and 8 degrees C (36 and 46 degrees F). Do not freeze. Do not shake. Throw away any unused portion if using a single-dose vial. Throw away any unused medicine after the expiration date. NOTE: This sheet is a summary. It may not cover all possible information. If you have questions about this medicine, talk to your doctor, pharmacist, or health care provider.    2016, [employer]vier/[employer]. (2008-07-06 10:23:57)  

## 2016-04-02 NOTE — Telephone Encounter (Signed)
Gave patient avs report and appointments for for September and October. Per patient request iron inf appointments scheduled for 9/15 and 9/22. Patient aware of all dates/times - desk nurse informed.

## 2016-04-03 LAB — SPECIMEN STATUS REPORT

## 2016-04-04 ENCOUNTER — Telehealth (HOSPITAL_COMMUNITY): Payer: Self-pay

## 2016-04-04 LAB — LIPID PANEL W/O CHOL/HDL RATIO
Cholesterol, Total: 114 mg/dL (ref 100–199)
HDL: 61 mg/dL (ref >39)
LDL CALC: 43 mg/dL (ref 0–99)
TRIGLYCERIDES: 49 mg/dL (ref 0–149)
VLDL Cholesterol Cal: 10 mg/dL (ref 5–40)

## 2016-04-04 LAB — SPECIMEN STATUS REPORT

## 2016-04-04 LAB — BRAIN NATRIURETIC PEPTIDE: BNP: 317.2 pg/mL — AB (ref 0.0–100.0)

## 2016-04-04 NOTE — Telephone Encounter (Signed)
Patient calling CHF clinic triage to verify we could see labs that were drawn at [facility] this past Monday as ordered by Dr. Aundra Dubin.  Labs have been drawn, however not reviewed by MD at this time.  Renee Pain, RN

## 2016-04-05 ENCOUNTER — Encounter: Payer: Self-pay | Admitting: *Deleted

## 2016-04-05 ENCOUNTER — Other Ambulatory Visit: Payer: Self-pay | Admitting: *Deleted

## 2016-04-05 DIAGNOSIS — D509 Iron deficiency anemia, unspecified: Secondary | ICD-10-CM

## 2016-04-05 NOTE — Progress Notes (Signed)
Per Dr. Irene Limbo, pt to receive feraheme infusion x2.  Repeat ferritin/iron tibc in 3-4 weeks.  If ferritin greater than 100 at that time, schedule aranesp.

## 2016-04-06 ENCOUNTER — Other Ambulatory Visit (HOSPITAL_COMMUNITY): Payer: Self-pay | Admitting: Cardiology

## 2016-04-11 ENCOUNTER — Ambulatory Visit (HOSPITAL_COMMUNITY)
Admission: RE | Admit: 2016-04-11 | Discharge: 2016-04-11 | Disposition: A | Discharge status: Home / Self Care | Payer: Medicare Other | Source: Ambulatory Visit | Attending: Cardiology | Admitting: Cardiology

## 2016-04-11 VITALS — BP 102/62 | HR 72 | Wt 207.5 lb

## 2016-04-11 DIAGNOSIS — M069 Rheumatoid arthritis, unspecified: Secondary | ICD-10-CM | POA: Insufficient documentation

## 2016-04-11 DIAGNOSIS — I5032 Chronic diastolic (congestive) heart failure: Secondary | ICD-10-CM

## 2016-04-11 DIAGNOSIS — Z9981 Dependence on supplemental oxygen: Secondary | ICD-10-CM | POA: Insufficient documentation

## 2016-04-11 DIAGNOSIS — I272 Other secondary pulmonary hypertension: Secondary | ICD-10-CM | POA: Insufficient documentation

## 2016-04-11 DIAGNOSIS — G4733 Obstructive sleep apnea (adult) (pediatric): Secondary | ICD-10-CM | POA: Diagnosis not present

## 2016-04-11 DIAGNOSIS — Z87891 Personal history of nicotine dependence: Secondary | ICD-10-CM | POA: Diagnosis not present

## 2016-04-11 DIAGNOSIS — D649 Anemia, unspecified: Secondary | ICD-10-CM | POA: Insufficient documentation

## 2016-04-11 DIAGNOSIS — Z8042 Family history of malignant neoplasm of prostate: Secondary | ICD-10-CM | POA: Diagnosis not present

## 2016-04-11 DIAGNOSIS — J9611 Chronic respiratory failure with hypoxia: Secondary | ICD-10-CM

## 2016-04-11 DIAGNOSIS — Z833 Family history of diabetes mellitus: Secondary | ICD-10-CM | POA: Insufficient documentation

## 2016-04-11 DIAGNOSIS — J449 Chronic obstructive pulmonary disease, unspecified: Secondary | ICD-10-CM | POA: Insufficient documentation

## 2016-04-11 DIAGNOSIS — I481 Persistent atrial fibrillation: Secondary | ICD-10-CM

## 2016-04-11 DIAGNOSIS — Z8249 Family history of ischemic heart disease and other diseases of the circulatory system: Secondary | ICD-10-CM | POA: Insufficient documentation

## 2016-04-11 DIAGNOSIS — I482 Chronic atrial fibrillation: Secondary | ICD-10-CM | POA: Diagnosis not present

## 2016-04-11 DIAGNOSIS — I4819 Other persistent atrial fibrillation: Secondary | ICD-10-CM

## 2016-04-11 DIAGNOSIS — I130 Hypertensive heart and chronic kidney disease with heart failure and stage 1 through stage 4 chronic kidney disease, or unspecified chronic kidney disease: Secondary | ICD-10-CM | POA: Insufficient documentation

## 2016-04-11 DIAGNOSIS — Z841 Family history of disorders of kidney and ureter: Secondary | ICD-10-CM | POA: Diagnosis not present

## 2016-04-11 DIAGNOSIS — K922 Gastrointestinal hemorrhage, unspecified: Secondary | ICD-10-CM | POA: Diagnosis not present

## 2016-04-11 DIAGNOSIS — I340 Nonrheumatic mitral (valve) insufficiency: Secondary | ICD-10-CM | POA: Insufficient documentation

## 2016-04-11 DIAGNOSIS — N184 Chronic kidney disease, stage 4 (severe): Secondary | ICD-10-CM | POA: Insufficient documentation

## 2016-04-11 LAB — BASIC METABOLIC PANEL
ANION GAP: 7 (ref 5–15)
BUN: 24 mg/dL — ABNORMAL HIGH (ref 6–20)
CALCIUM: 10.4 mg/dL — AB (ref 8.9–10.3)
CO2: 28 mmol/L (ref 22–32)
Chloride: 106 mmol/L (ref 101–111)
Creatinine, Ser: 2.07 mg/dL — ABNORMAL HIGH (ref 0.44–1.00)
GFR calc Af Amer: 27 mL/min — ABNORMAL LOW (ref >60)
GFR calc non Af Amer: 23 mL/min — ABNORMAL LOW (ref >60)
GLUCOSE: 114 mg/dL — AB (ref 65–99)
Potassium: 4.3 mmol/L (ref 3.5–5.1)
Sodium: 141 mmol/L (ref 135–145)

## 2016-04-11 LAB — BRAIN NATRIURETIC PEPTIDE: B Natriuretic Peptide: 180.4 pg/mL — ABNORMAL HIGH (ref 0.0–100.0)

## 2016-04-11 MED ORDER — POTASSIUM CHLORIDE ER 10 MEQ PO TBCR: EXTENDED_RELEASE_TABLET | ORAL | 3 refills | Status: DC

## 2016-04-11 NOTE — Progress Notes (Signed)
Patient ID: April Mueller, female   DOB: 1946-05-06, 70 y.o.   MRN: [id number]   HPI  PCP: Dr Harrington Challenger Pulmonary: Dr Byrum/Dr Melvyn Novas  Cardiology: Dr. Aundra Dubin  April Mueller is a 70 yo with COPD on home oxygen, rheumatoid arthritis, chronic atrial fibrillation, chronic diastolic CHF, CKD.    Moved back from [place] and saw Dr. Lamonte Sakai. In [place] was followed by cardiology and pulmonary. Started on Tracleer in 2006. Revatio was added, however was discontinued without any change in her symptoms. Stopped smoking in January 2005 when she had aortic aneurysm repair. Smoked 1.5-2 ppd x 15 - 20 years.  Later on macitentan.  I had her do a TEE in 6/16.  She had a loud MR murmur.  TEE showed very eccentric, posteriorly-directed MR that may be from subtle prolapse of the anterior mitral leaflet.  I am concerned that the MR could be severe but very difficult to fully visualize.  She additionally had PFTs done that were abnormal, but according to her pulmonologist Melvyn Novas), the obstruction was really only minimal and the restriction was primarily related to body habitus.  He does not think that we can explain her hypoxemia and dyspnea primarily from parenchymal lung pathology.  I sent her to [facility] for evaluation for percutaneous mitral valve clip.  They did not think that she was a good candidate.   She has been anemic and received 1 unit PRBCs in 8/16.  She saw GI and was noted to be heme negative, no scopes were done (heme negative and high risk). She has had iron infusions.   Opsumit was stopped in 9/16 due to suspicion for group 2 pulmonary hypertension only (low PVR) and she felt better off it.   She was admitted in 1/17 with increased dyspnea and was found to be profoundly anemic.  She had a total of 5 units PRBCs and an iron infusion in the hospital.  Stool was FOBT+, melena.  EGD, colonoscopy, and capsule endoscopy were all done without clear source of blood loss.  While in the hospital, she was noted to go into atrial  fibrillation with RVR.  She initially required amiodarone for rate control, but was transitioned over to Toprol XL. She was not anticoagulated due to concern for GI blood loss and anemia.  Anticoagulation was attempted later.   RHC in 3/17 showed elevated right heart filling pressures and mildly elevated PCWP.  There was mild pulmonary hypertension, likely pulmonary venous hypertension with PVR 1.9 WU.   She was admitted in 7/17 with symptomatic anemia, received 3 units PRBCs.  Anticoagulation was stopped.  Small bowel enteroscopy did not show a source of bleeding.    She returns for followup today.  At last visit, she was volume overloaded and torsemide was increased.  She remains in atrial fibrillation.  Weight is down 3 lbs. She is short of breath after walking 75-100 yards, overall feels better.  No BRBPR or melena.  No chest pain.  Stable orthopnea.  Using CPAP at night and oxygen during the day. She was seen by Dr Rayann Heman, he recommended against Watchman. She is now off anticoagulation.   Labs 02/05/13 K 3.8 Creatinine 1.37 05/08/13 K 3.8, Creatinine 1.58 11/14 K 4.1, creatinine 1.82 08/09/2014: K 4.0 Creatinine 1.45  5/16: K 3.7, creatinine 1.76, BNP 447 6/16: K 5 => 4.6, creatinine 2.09 => 2.13, BNP 291 8/16: K 4.7, creatinine 2.1, HCT 30.7 9/16: K 4.5, creatinine 2.3, HCT 34.1 1/17: K 4.8, creatinine 1.84 => 1.87, HCT  29.7, plts 136, FOBT+, BNP 403 2/17: K 3.5, creatinine 1.89, BNP 329, hgb 10 3/17: K 3.8, creatinine 2.5 => 2.3, HCT 35.4 => 33.5 5/17: K 3.7, creatinine 2.0, hgb 10.6 7/17: K 3.7, creatinine 2, hgb 9.9 8/17: K 3.8, creatinine 1.9, HCT 31.3  PMH:  1) COPD       --former smoker (28 pk-yrs)       --on home O2 - 4L/min rest, up to 5L/min exertion.        --spirometry 6/13 in [place] = 0.85L FVC = 1.3L no DLCO measured. FEV1 in 2014 = 0.73        --CT chest 10/14 - moderate centrilobular emphysema. No pulm fibrosis but there was evidence for post-infectious/inflammatory  scarring. Stable Asc Ao repair       --PFTs (11/14) FVC 46%, FEV1 0.8L (40%), ratio 87%, FEF 25-75% 0.44L DLCO 22%, TLC 50% => PFTs in 06/2013 did NOT suggest significant airflow obstruction according to Dr Gustavus Bryant last note despite "moderate emphysema" on CT.        --PFTs (6/16) were abnormal with severe obstruction, severe restriction, severely decreased DLCO.  Reviewed with Dr Melvyn Novas => he thinks obstruction is actually fairly minimal and that the restriction is due to body habitus.   2) Obesity 3) RA       --on plaquenil, sees Dr Lenna Gilford.  4) Type A aortic dissection s/p repair 2005     --c/b renal failure requiring HD x 3 months 5) PAH - previously treated in [place].  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      --on macitentan (revatio stopped 10/14).     --V/Q scan 11/14 no chronic PE     -- 2015 Tracleer stopped and started on macitentan. 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      -- RHC 08/18/2014: No role for pulmonary vasodilators.  RA = 14 RV = 62/3/15 PA = 63/21 (38) PCW = mean 23 v waves to 40 Fick cardiac output/index = 6.3/3.2 Thermodilution CO/CI = 6.1/3.1, PVR = 2.0 WU, FA sat = 91% PA sat = 57%, 62%     -- RHC 3/17: mean RA 13, PA 44/2 mean 29, mean PCWP 17, CI 3.04, PVR 1.9 WU => pulmonary venous hypertension. 6) OSA 7) Diastolic CHF    --ECHO 123XX123 EF 55-60% RV mildly dilated with normal function. Mild to moderate posterior MR. RVSP 56. Probable restrictive diastolic filling pattern    --ECHO 9/15 EF 60-65% RV mildly dilated with normal function. Mild to moderate posterior MR. RVSP 40. Ao Root ok    --TEE 6/16 with EF 55-60%, D-shaped interventricular septum suggestive of RV pressure/volume overload, RV mildly dilated with normal systolic function, peak RV-RA gradient 57 mmHg, very eccentric posteriorly-directed mitral regurgitation, possibly severe, etiology may be a very small area of prolapse on the anterior leaflet, s/p ascending aorta repair with  residual dissection flap in the descending thoracic aorta.       --Echo (1/17) with EF 55-60%, mild to moderate MR, PASP 66 8) CKD 9) Mitral regurgitation: Possibly severe by 6/16 TEE, very eccentric.  Not good candidate for percutaneous MV clip (seen at [facility]).  Echo (1/17) with only mild to moderate MR.  10) Anemia: Suspect from chronic disease/renal disease and well as chronic GI bleeding.  EGD/colonoscopy/capsule endoscopy in 1/17 did not show any definite site for blood loss.  Small bowel enteroscopy in 7/17 did not show etiology of bleeding. 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  11) Atrial  fibrillation: Persistent, 1st noted in 1/17.   ROS: All systems reviewed and negative except as per HPI.  Current Outpatient Prescriptions on File Prior to Encounter  Medication Sig Dispense Refill  . acetaminophen (TYLENOL) 325 MG tablet Take 2 tablets (650 mg total) by mouth every 6 (six) hours as needed for mild pain (or Fever >/= 101).    [name] allopurinol (ZYLOPRIM) 300 MG tablet Take 300 mg by mouth daily.    [name] ALPRAZolam (XANAX) 0.25 MG tablet Take 0.25 mg by mouth 2 (two) times daily as needed for anxiety or sleep.     [name] atorvastatin (LIPITOR) 10 MG tablet TAKE 1 TABLET DAILY 90 tablet 2  . fluticasone (FLONASE) 50 MCG/ACT nasal spray Place 2 sprays into both nostrils daily. 16 g 2  . hydroxychloroquine (PLAQUENIL) 200 MG tablet Take 200 mg by mouth 2 (two) times daily.    . metoprolol succinate (TOPROL-XL) 25 MG 24 hr tablet TAKE 1 TABLET TWICE A DAY 180 tablet 0  . Omega-3 Fatty Acids (FISH OIL) 1200 MG CAPS Take 1,200 mg by mouth daily.    . OXYGEN Inhale 3-6 L into the lungs continuous.     . torsemide (DEMADEX) 20 MG tablet Take 3 tablets (60 mg total) by mouth 2 (two) times daily. 360 tablet 3   No current facility-administered medications on file prior to encounter.    Social History   Social History  . Marital status: Legally Separated    Spouse name: N/A  . Number of children: 1  .  Years of education: N/A   Occupational History  . Disability     Office Work   Social History Main Topics  . Smoking status: Former Smoker    Packs/day: 1.50    Years: 14.00    Types: Cigarettes    Quit date: 08/09/2003  . Smokeless tobacco: Never Used  . Alcohol use 0.0 oz/week     Comment: 02/20/2016 "drank a little in my teens"  . Drug use: No  . Sexual activity: No   Other Topics Concern  . Not on file   Social History Narrative  . No narrative on file   Family History  Problem Relation Age of Onset  . Heart attack Mother   . Prostate cancer Father   . Diabetes Brother   . Kidney failure Brother     Vitals:   04/11/16 0857  BP: 102/62  BP Location: Left Arm  Patient Position: Sitting  Cuff Size: Normal  Pulse: 72  SpO2: 98%  Weight: 207 lb 8 oz (94.1 kg)   Physical Exam Gen: Pleasant, well-nourished, in no distress, normal affect; on O2 by [place].  HEENT: normal Neck: JVP 8-9 cm with HJR. Lungs: CTAB  Cardiovascular: PMI normal. Irregular S1S2, no S3/S4, 2/6 HSM LLSB/apex. 1+ edema 1/2 to knees bilaterally. Extremities: No deformities, no cyanosis or clubbing.  Neuro: alert, non focal. Cranial nerves intact. Moves all 4 extremities without difficulty Skin: Warm, no lesions or rashes  Assessment/Plan: 1. 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  This appeared to be primarily pulmonary venous hypertension.  Interestingly, there were very prominent V waves in the PCWP tracing. On exam, she has a prominent MR murmur.  I was concerned that significant MR may be playing a major role in her symptomatology. I did a TEE in 6/16 showing very eccentric, posteriorly-directed mitral regurgitation possibly from subtle anterior leaflet  prolapse.  I thought that the MR could be severe but was difficult to fully visualize.  She has significant COPD to  help explain her hypoxemia and dyspnea. She was seen at [facility] for consideration of percutaneous MV clip and was not thought to be a good candidate => concern that MV disease may not actually be severe and concern that her dyspnea is coming from COPD and would not be improved by MV clipping.  Most recent echo in 1/17 showed normal LV EF and only visualized mild to moderate MR.  Last RHC in 3/17 showed mildly elevated filling pressures and pulmonary venous hypertension (mild).  At last visit, torsemide was increased and weight is down 3 lbs.  However, she is still volume overloaded on exam.   - Increase torsemide to 80 qam/60 qpm and KCl to 40 qam/20 qpm.  She will need BMET/BNP today and again in 10 days.   2. Chronic respiratory failure:  She is on 4-5 L home oxygen by [place]. She has COPD and restriction from her body habitus.  3. OSA: Continue CPAP nightly.  4. CKD: Stage III-IV.  Repeat BMET today. 5. COPD: See #2 above, per pulmonary.     6. Aortic dissection s/p repair 2005: Although complete records are not available, it appears that the patient developed a Type A dissection in 2005 with ascending aorta repair (can see the surgically repaired ascending aorta well on TEE).  She has residual dissection in the descending thoracic aorta.  7. Pulmonary hypertension: This is primarily [place] group 2 (pulmonary venous hypertension) based on RHC in 3/17.  She is not a good candidate for pulmonary vasodilators.   8. Mitral regurgitation:  TEE concerning for possibly severe, eccentric mitral regurgitation, most likely due to subtle prolapse of the anterior mitral valve leaflet.  I have been concerned that the MR is playing a role in her CHF. She had prominent V-waves on RHC in 1/16, and she has a loud mitral area murmur.  I do not think that she is a good candidate for open MV surgery. I had her evaluated at [facility] for possible percutaneous mitral clipping.  She was turned down for this as her MR was not clearly severe and  fixing it may not help her hypoxemia appreciably.  Of note, most recent echo in 1/17 actually visualized only mild to moderate MR.   9. Anemia: Suspect anemia of chronic disease/renal disease but also with component of GI blood loss.  She has had GI bleeding with FOBT+, but unable to visualized bleeding source on EGD, colonoscopy, enteroscopy, or capsule endoscopy (most recently in 7/17).  Seeing hematology, getting Aranesp. Some concern now for possible myelodysplastic syndrome.  - She will get IV iron via hematology.  10. Atrial fibrillation: Persistent.  Rate is controlled.  CHADSVASC = 3.  She was evaluated for Watchman but decided not to be a good candidate. Due to recurrent severe GI bleeding, anticoagulation was stopped altogether.  Followup in 1 month.   [name] 04/11/2016

## 2016-04-11 NOTE — Patient Instructions (Signed)
Increase Torsemide to 80 mg (4 mg) in AM and 60 mg (3 tabs) in PM  Increase Potassium to 40 meq in AM and 20 meq in PM, we have sent you in a new prescription for K-dur 10 meq tablets, when you get these take 4 tabs in AM and 2 tabs in PM  Labs today  Labs in 10 days  Your physician recommends that you schedule a follow-up appointment in: 1 month

## 2016-04-20 ENCOUNTER — Ambulatory Visit (HOSPITAL_BASED_OUTPATIENT_CLINIC_OR_DEPARTMENT_OTHER): Payer: Medicare Other

## 2016-04-20 VITALS — BP 107/51 | HR 80 | Temp 98.5°F | Resp 18

## 2016-04-20 DIAGNOSIS — D638 Anemia in other chronic diseases classified elsewhere: Secondary | ICD-10-CM

## 2016-04-20 DIAGNOSIS — D469 Myelodysplastic syndrome, unspecified: Secondary | ICD-10-CM

## 2016-04-20 DIAGNOSIS — D500 Iron deficiency anemia secondary to blood loss (chronic): Secondary | ICD-10-CM | POA: Diagnosis not present

## 2016-04-20 DIAGNOSIS — M069 Rheumatoid arthritis, unspecified: Secondary | ICD-10-CM

## 2016-04-20 DIAGNOSIS — K922 Gastrointestinal hemorrhage, unspecified: Secondary | ICD-10-CM | POA: Diagnosis not present

## 2016-04-20 MED ORDER — SODIUM CHLORIDE 0.9 % IV SOLN
510.0000 mg | Freq: Once | INTRAVENOUS | Status: AC
  Administered 2016-04-20: 510 mg via INTRAVENOUS
  Filled 2016-04-20: qty 17

## 2016-04-20 NOTE — Patient Instructions (Signed)

## 2016-04-23 ENCOUNTER — Other Ambulatory Visit: Payer: Self-pay | Admitting: *Deleted

## 2016-04-27 ENCOUNTER — Ambulatory Visit (HOSPITAL_BASED_OUTPATIENT_CLINIC_OR_DEPARTMENT_OTHER): Payer: Medicare Other

## 2016-04-27 ENCOUNTER — Other Ambulatory Visit (HOSPITAL_BASED_OUTPATIENT_CLINIC_OR_DEPARTMENT_OTHER): Payer: Medicare Other

## 2016-04-27 VITALS — BP 101/64 | HR 57 | Temp 98.0°F | Resp 18

## 2016-04-27 DIAGNOSIS — D500 Iron deficiency anemia secondary to blood loss (chronic): Secondary | ICD-10-CM

## 2016-04-27 DIAGNOSIS — K922 Gastrointestinal hemorrhage, unspecified: Secondary | ICD-10-CM

## 2016-04-27 DIAGNOSIS — D469 Myelodysplastic syndrome, unspecified: Secondary | ICD-10-CM

## 2016-04-27 DIAGNOSIS — M069 Rheumatoid arthritis, unspecified: Secondary | ICD-10-CM | POA: Diagnosis not present

## 2016-04-27 DIAGNOSIS — D638 Anemia in other chronic diseases classified elsewhere: Secondary | ICD-10-CM

## 2016-04-27 LAB — COMPREHENSIVE METABOLIC PANEL
ALBUMIN: 3 g/dL — AB (ref 3.5–5.0)
ALK PHOS: 116 U/L (ref 40–150)
ALT: 11 U/L (ref 0–55)
ANION GAP: 8 meq/L (ref 3–11)
AST: 15 U/L (ref 5–34)
BUN: 35.2 mg/dL — ABNORMAL HIGH (ref 7.0–26.0)
CALCIUM: 10.1 mg/dL (ref 8.4–10.4)
CO2: 28 mEq/L (ref 22–29)
Chloride: 106 mEq/L (ref 98–109)
Creatinine: 2 mg/dL — ABNORMAL HIGH (ref 0.6–1.1)
EGFR: 29 mL/min/{1.73_m2} — AB (ref >90)
GLUCOSE: 103 mg/dL (ref 70–140)
POTASSIUM: 3.9 meq/L (ref 3.5–5.1)
SODIUM: 143 meq/L (ref 136–145)
Total Bilirubin: 0.45 mg/dL (ref 0.20–1.20)
Total Protein: 7.4 g/dL (ref 6.4–8.3)

## 2016-04-27 LAB — CBC & DIFF AND RETIC
BASO%: 0.3 % (ref 0.0–2.0)
Basophils Absolute: 0 10*3/uL (ref 0.0–0.1)
EOS ABS: 0.1 10*3/uL (ref 0.0–0.5)
EOS%: 1.5 % (ref 0.0–7.0)
HCT: 31.3 % — ABNORMAL LOW (ref 34.8–46.6)
HEMOGLOBIN: 9.2 g/dL — AB (ref 11.6–15.9)
Immature Retic Fract: 8 % (ref 1.60–10.00)
LYMPH%: 13.1 % — AB (ref 14.0–49.7)
MCH: 28 pg (ref 25.1–34.0)
MCHC: 29.4 g/dL — ABNORMAL LOW (ref 31.5–36.0)
MCV: 95.4 fL (ref 79.5–101.0)
MONO#: 0.4 10*3/uL (ref 0.1–0.9)
MONO%: 5.7 % (ref 0.0–14.0)
NEUT%: 79.4 % — ABNORMAL HIGH (ref 38.4–76.8)
NEUTROS ABS: 5.3 10*3/uL (ref 1.5–6.5)
Platelets: 96 10*3/uL — ABNORMAL LOW (ref 145–400)
RBC: 3.28 10*6/uL — ABNORMAL LOW (ref 3.70–5.45)
RDW: 19.2 % — ABNORMAL HIGH (ref 11.2–14.5)
Retic %: 1.85 % (ref 0.70–2.10)
Retic Ct Abs: 60.68 10*3/uL (ref 33.70–90.70)
WBC: 6.7 10*3/uL (ref 3.9–10.3)
lymph#: 0.9 10*3/uL (ref 0.9–3.3)

## 2016-04-27 LAB — TECHNOLOGIST REVIEW

## 2016-04-27 LAB — FERRITIN: FERRITIN: 397 ng/mL — AB (ref 9–269)

## 2016-04-27 MED ORDER — SODIUM CHLORIDE 0.9 % IV SOLN
510.0000 mg | Freq: Once | INTRAVENOUS | Status: AC
  Administered 2016-04-27: 510 mg via INTRAVENOUS
  Filled 2016-04-27: qty 17

## 2016-04-27 MED ORDER — SODIUM CHLORIDE 0.9 % IV SOLN
Freq: Once | INTRAVENOUS | Status: AC
  Administered 2016-04-27: 10:00:00 via INTRAVENOUS

## 2016-04-27 NOTE — Progress Notes (Signed)
Patient arrived to infusion for feraheme without getting labs that were scheduled.  Patient states that she is supposed to get Feraheme today 04/27/2106 and labs and injection on 04/30/2016.    Spoke with Darden Dates RN for Dr Irene Limbo.  She stated that patient is to get infusion today but insurance will not approve her injection until 3 weeks after todays infusion and repeat labs.  She is already scheduled for infusion on 05/28/2016 for labs and infusion and her injection appointment is to be rescheduled for 06/04/2016 to allow time for insurance to approve injection.  Per nurse labs are not required today but was scheduled.  Patient is worried that her hemoglobin will get too low and wanted to have her labs done since her next appointment is over a month away.  Proceeded with getting labs.  Patient expressed understanding for new plan.

## 2016-04-27 NOTE — Patient Instructions (Signed)

## 2016-04-30 ENCOUNTER — Ambulatory Visit: Payer: Medicare Other

## 2016-04-30 ENCOUNTER — Other Ambulatory Visit: Payer: Medicare Other

## 2016-05-16 ENCOUNTER — Encounter (HOSPITAL_COMMUNITY): Payer: Self-pay

## 2016-05-16 ENCOUNTER — Ambulatory Visit (HOSPITAL_COMMUNITY)
Admission: RE | Admit: 2016-05-16 | Discharge: 2016-05-16 | Disposition: A | Discharge status: Home / Self Care | Payer: Medicare Other | Source: Ambulatory Visit | Attending: Cardiology | Admitting: Cardiology

## 2016-05-16 VITALS — BP 116/76 | HR 64 | Wt 207.2 lb

## 2016-05-16 DIAGNOSIS — I2721 Secondary pulmonary arterial hypertension: Secondary | ICD-10-CM | POA: Diagnosis not present

## 2016-05-16 DIAGNOSIS — N183 Chronic kidney disease, stage 3 unspecified: Secondary | ICD-10-CM

## 2016-05-16 DIAGNOSIS — E669 Obesity, unspecified: Secondary | ICD-10-CM | POA: Diagnosis not present

## 2016-05-16 DIAGNOSIS — I5032 Chronic diastolic (congestive) heart failure: Secondary | ICD-10-CM | POA: Diagnosis not present

## 2016-05-16 DIAGNOSIS — I130 Hypertensive heart and chronic kidney disease with heart failure and stage 1 through stage 4 chronic kidney disease, or unspecified chronic kidney disease: Secondary | ICD-10-CM | POA: Diagnosis not present

## 2016-05-16 DIAGNOSIS — M069 Rheumatoid arthritis, unspecified: Secondary | ICD-10-CM | POA: Insufficient documentation

## 2016-05-16 DIAGNOSIS — N184 Chronic kidney disease, stage 4 (severe): Secondary | ICD-10-CM | POA: Insufficient documentation

## 2016-05-16 DIAGNOSIS — I340 Nonrheumatic mitral (valve) insufficiency: Secondary | ICD-10-CM | POA: Insufficient documentation

## 2016-05-16 DIAGNOSIS — G4733 Obstructive sleep apnea (adult) (pediatric): Secondary | ICD-10-CM | POA: Insufficient documentation

## 2016-05-16 DIAGNOSIS — I482 Chronic atrial fibrillation: Secondary | ICD-10-CM | POA: Diagnosis not present

## 2016-05-16 DIAGNOSIS — Z9981 Dependence on supplemental oxygen: Secondary | ICD-10-CM | POA: Insufficient documentation

## 2016-05-16 DIAGNOSIS — D649 Anemia, unspecified: Secondary | ICD-10-CM | POA: Diagnosis not present

## 2016-05-16 DIAGNOSIS — J449 Chronic obstructive pulmonary disease, unspecified: Secondary | ICD-10-CM | POA: Diagnosis not present

## 2016-05-16 DIAGNOSIS — J9611 Chronic respiratory failure with hypoxia: Secondary | ICD-10-CM | POA: Diagnosis not present

## 2016-05-16 DIAGNOSIS — I481 Persistent atrial fibrillation: Secondary | ICD-10-CM

## 2016-05-16 DIAGNOSIS — Z87891 Personal history of nicotine dependence: Secondary | ICD-10-CM | POA: Insufficient documentation

## 2016-05-16 DIAGNOSIS — I4819 Other persistent atrial fibrillation: Secondary | ICD-10-CM

## 2016-05-16 LAB — BASIC METABOLIC PANEL
Anion gap: 7 (ref 5–15)
BUN: 30 mg/dL — AB (ref 6–20)
CHLORIDE: 106 mmol/L (ref 101–111)
CO2: 29 mmol/L (ref 22–32)
CREATININE: 1.82 mg/dL — AB (ref 0.44–1.00)
Calcium: 10.6 mg/dL — ABNORMAL HIGH (ref 8.9–10.3)
GFR calc non Af Amer: 27 mL/min — ABNORMAL LOW (ref >60)
GFR, EST AFRICAN AMERICAN: 32 mL/min — AB (ref >60)
GLUCOSE: 115 mg/dL — AB (ref 65–99)
Potassium: 3.8 mmol/L (ref 3.5–5.1)
Sodium: 142 mmol/L (ref 135–145)

## 2016-05-16 NOTE — Progress Notes (Signed)
Patient ID: April Mueller, female   DOB: January 31, 1946, 69 y.o.   MRN: [id number]   HPI  PCP: Dr Harrington Challenger Pulmonary: Dr Byrum/Dr Melvyn Novas  Cardiology: Dr. Aundra Dubin  [name] is a 71 yo with COPD on home oxygen, rheumatoid arthritis, chronic atrial fibrillation, chronic diastolic CHF, CKD.    Moved back from [place] and saw Dr. Lamonte Sakai. In [place] was followed by cardiology and pulmonary. Started on Tracleer in 2006. Revatio was added, however was discontinued without any change in her symptoms. Stopped smoking in January 2005 when she had aortic aneurysm repair. Smoked 1.5-2 ppd x 15 - 20 years.  Later on macitentan.  I had her do a TEE in 6/16.  She had a loud MR murmur.  TEE showed very eccentric, posteriorly-directed MR that may be from subtle prolapse of the anterior mitral leaflet.  I am concerned that the MR could be severe but very difficult to fully visualize.  She additionally had PFTs done that were abnormal, but according to her pulmonologist Melvyn Novas), the obstruction was really only minimal and the restriction was primarily related to body habitus.  He does not think that we can explain her hypoxemia and dyspnea primarily from parenchymal lung pathology.  I sent her to [facility] for evaluation for percutaneous mitral valve clip.  They did not think that she was a good candidate.   She has been anemic and received 1 unit PRBCs in 8/16.  She saw GI and was noted to be heme negative, no scopes were done (heme negative and high risk). She has had iron infusions.   Opsumit was stopped in 9/16 due to suspicion for group 2 pulmonary hypertension only (low PVR) and she felt better off it.   She was admitted in 1/17 with increased dyspnea and was found to be profoundly anemic.  She had a total of 5 units PRBCs and an iron infusion in the hospital.  Stool was FOBT+, melena.  EGD, colonoscopy, and capsule endoscopy were all done without clear source of blood loss.  While in the hospital, she was noted to go into atrial  fibrillation with RVR.  She initially required amiodarone for rate control, but was transitioned over to Toprol XL. She was not anticoagulated due to concern for GI blood loss and anemia.  Anticoagulation was attempted later.   RHC in 3/17 showed elevated right heart filling pressures and mildly elevated PCWP.  There was mild pulmonary hypertension, likely pulmonary venous hypertension with PVR 1.9 WU.   She was admitted in 7/17 with symptomatic anemia, received 3 units PRBCs.  Anticoagulation was stopped.  Small bowel enteroscopy did not show a source of bleeding.    She was seen by Dr Rayann Heman, he recommended against Watchman. She is now off anticoagulation.  Getting Fe infusions.   She returns for followup today.  At last visit, she was volume overloaded and torsemide was increased again.  She remains in atrial fibrillation.  Weight is stable. She is doing well.  No dyspnea walking on flat ground, does get short of breath with steps.  No BRBPR or melena.  No chest pain.  Stable orthopnea.  Using CPAP at night and oxygen during the day.   Labs 02/05/13 K 3.8 Creatinine 1.37 05/08/13 K 3.8, Creatinine 1.58 11/14 K 4.1, creatinine 1.82 08/09/2014: K 4.0 Creatinine 1.45  5/16: K 3.7, creatinine 1.76, BNP 447 6/16: K 5 => 4.6, creatinine 2.09 => 2.13, BNP 291 8/16: K 4.7, creatinine 2.1, HCT 30.7 9/16: K 4.5, creatinine  2.3, HCT 34.1 1/17: K 4.8, creatinine 1.84 => 1.87, HCT 29.7, plts 136, FOBT+, BNP 403 2/17: K 3.5, creatinine 1.89, BNP 329, hgb 10 3/17: K 3.8, creatinine 2.5 => 2.3, HCT 35.4 => 33.5 5/17: K 3.7, creatinine 2.0, hgb 10.6 7/17: K 3.7, creatinine 2, hgb 9.9 8/17: K 3.8, creatinine 1.9, HCT 31.3 9/17: K 3.9, creatinine 2  PMH:  1) COPD       --former smoker (28 pk-yrs)       --on home O2 - 4L/min rest, up to 5L/min exertion.        --spirometry 6/13 in [place] = 0.85L FVC = 1.3L no DLCO measured. FEV1 in 2014 = 0.73        --CT chest 10/14 - moderate centrilobular emphysema. No  pulm fibrosis but there was evidence for post-infectious/inflammatory scarring. Stable Asc Ao repair       --PFTs (11/14) FVC 46%, FEV1 0.8L (40%), ratio 87%, FEF 25-75% 0.44L DLCO 22%, TLC 50% => PFTs in 06/2013 did NOT suggest significant airflow obstruction according to Dr Gustavus Bryant last note despite "moderate emphysema" on CT.        --PFTs (6/16) were abnormal with severe obstruction, severe restriction, severely decreased DLCO.  Reviewed with Dr Melvyn Novas => he thinks obstruction is actually fairly minimal and that the restriction is due to body habitus.   2) Obesity 3) RA       --on plaquenil, sees Dr Lenna Gilford.  4) Type A aortic dissection s/p repair 2005     --c/b renal failure requiring HD x 3 months 5) PAH - previously treated in [place].  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      --on macitentan (revatio stopped 10/14).     --V/Q scan 11/14 no chronic PE     -- 2015 Tracleer stopped and started on macitentan. 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      -- RHC 08/18/2014: No role for pulmonary vasodilators.  RA = 14 RV = 62/3/15 PA = 63/21 (38) PCW = mean 23 v waves to 40 Fick cardiac output/index = 6.3/3.2 Thermodilution CO/CI = 6.1/3.1, PVR = 2.0 WU, FA sat = 91% PA sat = 57%, 62%     -- RHC 3/17: mean RA 13, PA 44/2 mean 29, mean PCWP 17, CI 3.04, PVR 1.9 WU => pulmonary venous hypertension. 6) OSA 7) Diastolic CHF    --ECHO [id number] EF 55-60% RV mildly dilated with normal function. Mild to moderate posterior MR. RVSP 56. Probable restrictive diastolic filling pattern    --ECHO 9/15 EF 60-65% RV mildly dilated with normal function. Mild to moderate posterior MR. RVSP 40. Ao Root ok    --TEE 6/16 with EF 55-60%, D-shaped interventricular septum suggestive of RV pressure/volume overload, RV mildly dilated with normal systolic function, peak RV-RA gradient 57 mmHg, very eccentric posteriorly-directed mitral regurgitation, possibly severe, etiology may be a very small area of  prolapse on the anterior leaflet, s/p ascending aorta repair with residual dissection flap in the descending thoracic aorta.       --Echo (1/17) with EF 55-60%, mild to moderate MR, PASP 66 8) CKD 9) Mitral regurgitation: Possibly severe by 6/16 TEE, very eccentric.  Not good candidate for percutaneous MV clip (seen at [facility]).  Echo (1/17) with only mild to moderate MR.  10) Anemia: Suspect from chronic disease/renal disease and well as chronic GI bleeding.  EGD/colonoscopy/capsule endoscopy in 1/17 did not show any definite site for blood loss.  Small bowel  enteroscopy in 7/17 did not show etiology of bleeding. 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  Getting Fe infusions.  11) Atrial fibrillation: Persistent, 1st noted in 1/17.   ROS: All systems reviewed and negative except as per HPI.  Current Outpatient Prescriptions on File Prior to Encounter  Medication Sig Dispense Refill  . acetaminophen (TYLENOL) 325 MG tablet Take 2 tablets (650 mg total) by mouth every 6 (six) hours as needed for mild pain (or Fever >/= 101).    [name] allopurinol (ZYLOPRIM) 300 MG tablet Take 300 mg by mouth daily.    [name] ALPRAZolam (XANAX) 0.25 MG tablet Take 0.25 mg by mouth 2 (two) times daily as needed for anxiety or sleep.     [name] atorvastatin (LIPITOR) 10 MG tablet TAKE 1 TABLET DAILY 90 tablet 2  . fluticasone (FLONASE) 50 MCG/ACT nasal spray Place 2 sprays into both nostrils daily. 16 g 2  . hydroxychloroquine (PLAQUENIL) 200 MG tablet Take 200 mg by mouth 2 (two) times daily.    . metoprolol succinate (TOPROL-XL) 25 MG 24 hr tablet TAKE 1 TABLET TWICE A DAY 180 tablet 0  . Omega-3 Fatty Acids (FISH OIL) 1200 MG CAPS Take 1,200 mg by mouth daily.    . OXYGEN Inhale 3-6 L into the lungs continuous.     . potassium chloride (K-DUR) 10 MEQ tablet Take 4 tabs in AM and 2 tabs in PM 360 tablet 3   No current facility-administered medications on file prior to encounter.    Social History   Social History  . Marital  status: Legally Separated    Spouse name: N/A  . Number of children: 1  . Years of education: N/A   Occupational History  . Disability     Office Work   Social History Main Topics  . Smoking status: Former Smoker    Packs/day: 1.50    Years: 14.00    Types: Cigarettes    Quit date: 08/09/2003  . Smokeless tobacco: Never Used  . Alcohol use 0.0 oz/week     Comment: 02/20/2016 "drank a little in my teens"  . Drug use: No  . Sexual activity: No   Other Topics Concern  . None   Social History Narrative  . None   Family History  Problem Relation Age of Onset  . Heart attack Mother   . Prostate cancer Father   . Diabetes Brother   . Kidney failure Brother     Vitals:   05/16/16 0827  BP: 116/76  Pulse: 64  SpO2: 97%  Weight: 207 lb 4 oz (94 kg)   Physical Exam Gen: Pleasant, well-nourished, in no distress, normal affect; on O2 by [place].  HEENT: normal Neck: JVP 8 cm  Lungs: CTAB  Cardiovascular: PMI normal. Irregular S1S2, no S3/S4, 2/6 HSM LLSB/apex. 1+ ankle edema bilaterally. Extremities: No deformities, no cyanosis or clubbing.  Neuro: alert, non focal. Cranial nerves intact. Moves all 4 extremities without difficulty Skin: Warm, no lesions or rashes  Assessment/Plan: 1. 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  This appeared to be primarily pulmonary venous hypertension.  Interestingly, there were very prominent V waves in the PCWP tracing. On exam, she has a prominent MR murmur.  I was concerned that significant MR may be playing a major role in her symptomatology. I did a TEE in 6/16 showing very eccentric, posteriorly-directed mitral regurgitation possibly from subtle anterior leaflet prolapse.  I thought that the MR could be severe but was difficult to fully visualize.  She has significant COPD to help explain her hypoxemia and dyspnea.  She was [facility] for [facility] of percutaneous MV clip and was not thought to be a good candidate => concern that MV disease may not actually be severe and concern that her dyspnea is coming from COPD and would not be improved by MV clipping.  Most recent echo in 1/17 showed normal LV EF and only visualized mild to moderate MR.  Last RHC in 3/17 showed mildly elevated filling pressures and pulmonary venous hypertension (mild).  Weight stable, volume looks ok on exam today.    - Continue torsemide to 80 qam/60 qpm and KCl to 40 qam/20 qpm.  BMET today. 2. Chronic respiratory failure:  She is on 4-5 L home oxygen by [place]. She has COPD and restriction from her body habitus.  3. OSA: Continue CPAP nightly.  4. CKD: Stage III-IV.  Repeat BMET today. 5. COPD: See #2 above, per pulmonary.     6. Aortic dissection s/p repair 2005: Although complete records are not available, it appears that the patient developed a Type A dissection in 2005 with ascending aorta repair (can see the surgically repaired ascending aorta well on TEE).  She has residual dissection in the descending thoracic aorta.  7. Pulmonary hypertension: This is primarily [place] group 2 (pulmonary venous hypertension) based on RHC in 3/17.  She is not a good candidate for pulmonary vasodilators.   8. Mitral regurgitation:  TEE concerning for possibly severe, eccentric mitral regurgitation, most likely due to subtle prolapse of the anterior mitral valve leaflet.  I have been concerned that the MR is playing a role in her CHF. She had prominent V-waves on RHC in 1/16, and she has a loud mitral area murmur.  I do not think that she is a good candidate for open MV surgery. I had her evaluated at [facility] for possible percutaneous mitral clipping.  She was turned down for this as her MR was not clearly severe and fixing it may not help her hypoxemia appreciably.  Of note, most recent echo in 1/17 actually visualized only mild to moderate MR.  Still has  significant MR, will repeat echo in 1/18.  9. Anemia: Suspect anemia of chronic disease/renal disease but also with component of GI blood loss.  She has had GI bleeding with FOBT+, but unable to visualized bleeding source on EGD, colonoscopy, enteroscopy, or capsule endoscopy (most recently in 7/17).  Seeing hematology, getting Aranesp and Fe infusions. Some concern now for possible myelodysplastic syndrome.  10. Atrial fibrillation: Persistent.  Rate is controlled.  CHADSVASC = 3.  She was evaluated for Watchman but decided not to be a good candidate. Due to recurrent severe GI bleeding, anticoagulation was stopped altogether. No stroke-like symptoms.   Followup in 3 months.   [name] 05/16/2016

## 2016-05-16 NOTE — Patient Instructions (Signed)
Labs today  We will contact you in 3 months to schedule your next appointment.  

## 2016-05-17 ENCOUNTER — Other Ambulatory Visit (HOSPITAL_COMMUNITY): Payer: Self-pay | Admitting: *Deleted

## 2016-05-17 MED ORDER — TORSEMIDE 20 MG PO TABS: ORAL_TABLET | ORAL | 3 refills | Status: DC

## 2016-05-28 ENCOUNTER — Ambulatory Visit: Payer: Medicare Other

## 2016-05-28 ENCOUNTER — Ambulatory Visit (HOSPITAL_BASED_OUTPATIENT_CLINIC_OR_DEPARTMENT_OTHER): Payer: Medicare Other | Admitting: Hematology

## 2016-05-28 ENCOUNTER — Encounter: Payer: Self-pay | Admitting: Hematology

## 2016-05-28 ENCOUNTER — Other Ambulatory Visit (HOSPITAL_BASED_OUTPATIENT_CLINIC_OR_DEPARTMENT_OTHER): Payer: Medicare Other

## 2016-05-28 VITALS — BP 108/58 | HR 58 | Temp 98.2°F | Resp 18 | Ht 64.0 in | Wt 206.0 lb

## 2016-05-28 DIAGNOSIS — N189 Chronic kidney disease, unspecified: Secondary | ICD-10-CM

## 2016-05-28 DIAGNOSIS — M069 Rheumatoid arthritis, unspecified: Secondary | ICD-10-CM | POA: Diagnosis not present

## 2016-05-28 DIAGNOSIS — K920 Hematemesis: Secondary | ICD-10-CM | POA: Diagnosis not present

## 2016-05-28 DIAGNOSIS — K922 Gastrointestinal hemorrhage, unspecified: Secondary | ICD-10-CM | POA: Diagnosis not present

## 2016-05-28 DIAGNOSIS — D500 Iron deficiency anemia secondary to blood loss (chronic): Secondary | ICD-10-CM

## 2016-05-28 DIAGNOSIS — I509 Heart failure, unspecified: Secondary | ICD-10-CM

## 2016-05-28 DIAGNOSIS — D469 Myelodysplastic syndrome, unspecified: Secondary | ICD-10-CM

## 2016-05-28 DIAGNOSIS — D509 Iron deficiency anemia, unspecified: Secondary | ICD-10-CM

## 2016-05-28 DIAGNOSIS — I272 Pulmonary hypertension, unspecified: Secondary | ICD-10-CM

## 2016-05-28 DIAGNOSIS — D696 Thrombocytopenia, unspecified: Secondary | ICD-10-CM

## 2016-05-28 DIAGNOSIS — D649 Anemia, unspecified: Secondary | ICD-10-CM | POA: Diagnosis not present

## 2016-05-28 LAB — COMPREHENSIVE METABOLIC PANEL
ALT: 13 U/L (ref 0–55)
AST: 17 U/L (ref 5–34)
Albumin: 3.3 g/dL — ABNORMAL LOW (ref 3.5–5.0)
Alkaline Phosphatase: 128 U/L (ref 40–150)
Anion Gap: 8 mEq/L (ref 3–11)
BILIRUBIN TOTAL: 0.36 mg/dL (ref 0.20–1.20)
BUN: 36.4 mg/dL — ABNORMAL HIGH (ref 7.0–26.0)
CO2: 31 meq/L — AB (ref 22–29)
Calcium: 10.9 mg/dL — ABNORMAL HIGH (ref 8.4–10.4)
Chloride: 105 mEq/L (ref 98–109)
Creatinine: 2.2 mg/dL — ABNORMAL HIGH (ref 0.6–1.1)
EGFR: 26 mL/min/{1.73_m2} — AB (ref >90)
GLUCOSE: 119 mg/dL (ref 70–140)
POTASSIUM: 4.5 meq/L (ref 3.5–5.1)
SODIUM: 144 meq/L (ref 136–145)
TOTAL PROTEIN: 7.9 g/dL (ref 6.4–8.3)

## 2016-05-28 LAB — CBC & DIFF AND RETIC
BASO%: 0.3 % (ref 0.0–2.0)
Basophils Absolute: 0 10*3/uL (ref 0.0–0.1)
EOS ABS: 0 10*3/uL (ref 0.0–0.5)
EOS%: 0 % (ref 0.0–7.0)
HCT: 38.2 % (ref 34.8–46.6)
HGB: 11.4 g/dL — ABNORMAL LOW (ref 11.6–15.9)
IMMATURE RETIC FRACT: 1.6 % (ref 1.60–10.00)
LYMPH#: 0.7 10*3/uL — AB (ref 0.9–3.3)
LYMPH%: 12.1 % — AB (ref 14.0–49.7)
MCH: 29.1 pg (ref 25.1–34.0)
MCHC: 29.8 g/dL — ABNORMAL LOW (ref 31.5–36.0)
MCV: 97.4 fL (ref 79.5–101.0)
MONO#: 0.4 10*3/uL (ref 0.1–0.9)
MONO%: 6.5 % (ref 0.0–14.0)
NEUT%: 81.1 % — ABNORMAL HIGH (ref 38.4–76.8)
NEUTROS ABS: 4.9 10*3/uL (ref 1.5–6.5)
Platelets: 87 10*3/uL — ABNORMAL LOW (ref 145–400)
RBC: 3.92 10*6/uL (ref 3.70–5.45)
RDW: 18.2 % — AB (ref 11.2–14.5)
RETIC %: 0.9 % (ref 0.70–2.10)
Retic Ct Abs: 35.28 10*3/uL (ref 33.70–90.70)
WBC: 6 10*3/uL (ref 3.9–10.3)

## 2016-05-28 LAB — IRON AND TIBC
%SAT: 19 % — ABNORMAL LOW (ref 21–57)
Iron: 52 ug/dL (ref 41–142)
TIBC: 266 ug/dL (ref 236–444)
UIBC: 215 ug/dL (ref 120–384)

## 2016-05-28 LAB — FERRITIN: FERRITIN: 338 ng/mL — AB (ref 9–269)

## 2016-05-28 LAB — TECHNOLOGIST REVIEW

## 2016-06-04 ENCOUNTER — Ambulatory Visit: Payer: Medicare Other

## 2016-06-04 ENCOUNTER — Encounter: Payer: Self-pay | Admitting: *Deleted

## 2016-06-04 ENCOUNTER — Other Ambulatory Visit: Payer: Self-pay | Admitting: *Deleted

## 2016-06-04 ENCOUNTER — Telehealth: Payer: Self-pay | Admitting: Hematology

## 2016-06-04 NOTE — Progress Notes (Signed)
Patient is not due for injection appointment. Patient was seen last week with MD Irene Limbo and aranesp was postponed due to Hgb. RN left voicemail for patient. Spoke with Mayra Reel RN (injection nurse) and he verbalized understanding.

## 2016-06-04 NOTE — Telephone Encounter (Signed)
Spoke with patient re next appointment for 12/4 and mailed dec/jan schedule per patient request

## 2016-06-05 ENCOUNTER — Other Ambulatory Visit (HOSPITAL_COMMUNITY): Payer: Self-pay | Admitting: Cardiology

## 2016-07-09 ENCOUNTER — Ambulatory Visit: Payer: Medicare Other

## 2016-07-09 ENCOUNTER — Other Ambulatory Visit (HOSPITAL_BASED_OUTPATIENT_CLINIC_OR_DEPARTMENT_OTHER): Payer: Medicare Other

## 2016-07-09 DIAGNOSIS — K922 Gastrointestinal hemorrhage, unspecified: Secondary | ICD-10-CM | POA: Diagnosis not present

## 2016-07-09 DIAGNOSIS — D500 Iron deficiency anemia secondary to blood loss (chronic): Secondary | ICD-10-CM

## 2016-07-09 LAB — CBC & DIFF AND RETIC
BASO%: 0.3 % (ref 0.0–2.0)
BASOS ABS: 0 10*3/uL (ref 0.0–0.1)
EOS%: 0 % (ref 0.0–7.0)
Eosinophils Absolute: 0 10*3/uL (ref 0.0–0.5)
HEMATOCRIT: 37.9 % (ref 34.8–46.6)
HGB: 11.4 g/dL — ABNORMAL LOW (ref 11.6–15.9)
IMMATURE RETIC FRACT: 2.7 % (ref 1.60–10.00)
LYMPH#: 0.7 10*3/uL — AB (ref 0.9–3.3)
LYMPH%: 12.5 % — ABNORMAL LOW (ref 14.0–49.7)
MCH: 29.6 pg (ref 25.1–34.0)
MCHC: 30.1 g/dL — AB (ref 31.5–36.0)
MCV: 98.4 fL (ref 79.5–101.0)
MONO#: 0.5 10*3/uL (ref 0.1–0.9)
MONO%: 8.8 % (ref 0.0–14.0)
NEUT#: 4.6 10*3/uL (ref 1.5–6.5)
NEUT%: 78.4 % — ABNORMAL HIGH (ref 38.4–76.8)
Platelets: 88 10*3/uL — ABNORMAL LOW (ref 145–400)
RBC: 3.85 10*6/uL (ref 3.70–5.45)
RDW: 16.1 % — AB (ref 11.2–14.5)
RETIC %: 0.96 % (ref 0.70–2.10)
RETIC CT ABS: 36.96 10*3/uL (ref 33.70–90.70)
WBC: 5.9 10*3/uL (ref 3.9–10.3)

## 2016-07-09 LAB — IRON AND TIBC
%SAT: 12 % — AB (ref 21–57)
Iron: 31 ug/dL — ABNORMAL LOW (ref 41–142)
TIBC: 255 ug/dL (ref 236–444)
UIBC: 224 ug/dL (ref 120–384)

## 2016-07-09 LAB — COMPREHENSIVE METABOLIC PANEL
ALT: 12 U/L (ref 0–55)
AST: 16 U/L (ref 5–34)
Albumin: 3.2 g/dL — ABNORMAL LOW (ref 3.5–5.0)
Alkaline Phosphatase: 127 U/L (ref 40–150)
Anion Gap: 9 mEq/L (ref 3–11)
BUN: 39 mg/dL — AB (ref 7.0–26.0)
CALCIUM: 10.8 mg/dL — AB (ref 8.4–10.4)
CHLORIDE: 104 meq/L (ref 98–109)
CO2: 30 meq/L — AB (ref 22–29)
CREATININE: 2 mg/dL — AB (ref 0.6–1.1)
EGFR: 29 mL/min/{1.73_m2} — ABNORMAL LOW (ref >90)
Glucose: 118 mg/dl (ref 70–140)
Potassium: 4.2 mEq/L (ref 3.5–5.1)
Sodium: 144 mEq/L (ref 136–145)
TOTAL PROTEIN: 7.6 g/dL (ref 6.4–8.3)
Total Bilirubin: 0.59 mg/dL (ref 0.20–1.20)

## 2016-07-09 LAB — FERRITIN: Ferritin: 239 ng/ml (ref 9–269)

## 2016-07-09 NOTE — Progress Notes (Signed)
Pt's hemoglobin noted at 11.4, no injection needed per protocol order. Pt given copy of current labs and schedule. Pt instructed to follow schedule and to call office if issues occur. Pt verbalized understanding.

## 2016-07-10 NOTE — Progress Notes (Signed)
[name]    HEMATOLOGY/ONCOLOGY CLINIC NOTE  Date of Service: .05/28/2016   Patient Care Team: Lawerance Cruel, MD as PCP - General (Family Medicine)  CHIEF COMPLAINTS/PURPOSE OF CONSULTATION: f/u for Anemia  Diagnosis: Normocytic Normochromic Anemia likely multifactorial - CKD/RA/GIB/MDS  Treatment: Aranesp q4weeks for Hgb <11 IV feraheme as needed to maintain ferritin >100.  HISTORY OF PRESENTING ILLNESS: Please see my initial consultation for details on initial presentation.  INTERVAL HISTORY  Ms Tuch is here for her scheduled follow-up for her anemia. Her hemoglobin today is 11.4 with a normal MCV. She feels good overall. Ferritin is 338. No overt GI bleeding noted.  MEDICAL HISTORY:  Past Medical History:  Diagnosis Date  . Anemia 03/23/15   transfusion  . Anxiety   . Aortic aneurysm ([place])   . Arthritis    "qwhere" (02/20/2016)  . CHF (congestive heart failure) ([place])   . Chronic lower back pain   . CKD (chronic kidney disease) stage 3, GFR 30-59 ml/min    "on dialysis for 3 months after my aneurysm in 2005" (02/20/2016)  . COPD (chronic obstructive pulmonary disease) ([place])   . Gout   . Heart murmur   . History of blood transfusion "several"  . Hypertension   . Lupus   . On home oxygen therapy    "3L; 24/7" (02/20/2016)  . OSA on CPAP   . Pulmonary HTN   . Rheumatoid arthritis(714.0) 11/06/2012  . 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 ABDOMINAL AORTIC ANEURYSM REPAIR  2005  . BREAST LUMPECTOMY Right   . CARDIAC CATHETERIZATION N/A 10/24/2015   Procedure: Right Heart Cath;  Surgeon: Larey Dresser, MD;  Location: [place] CV LAB;  Service: Cardiovascular;  Laterality: N/A;  . CATARACT EXTRACTION W/ INTRAOCULAR LENS  IMPLANT, BILATERAL Bilateral 2016  . COLONOSCOPY N/A 08/19/2015   Procedure: COLONOSCOPY;  Surgeon: Jerene Bears, MD;  Location: [facility] ENDOSCOPY;  Service: Endoscopy;  Laterality:  N/A;  . ENTEROSCOPY N/A 02/22/2016   Procedure: ENTEROSCOPY;  Surgeon: Milus Banister, MD;  Location: [place];  Service: Endoscopy;  Laterality: N/A;  . ESOPHAGOGASTRODUODENOSCOPY N/A 08/19/2015   Procedure: ESOPHAGOGASTRODUODENOSCOPY (EGD);  Surgeon: Jerene Bears, MD;  Location: [facility] ENDOSCOPY;  Service: Endoscopy;  Laterality: N/A;  patient scheduled, anesthesia aware of 1500 case, per Tabatha.  08/18/15 DP  . GIVENS CAPSULE STUDY N/A 08/21/2015   Procedure: GIVENS CAPSULE STUDY;  Surgeon: Gatha Mayer, MD;  Location: [place];  Service: Endoscopy;  Laterality: N/A;  . HEMORRHOID SURGERY    . RIGHT HEART CATHETERIZATION N/A 08/18/2014   Procedure: RIGHT HEART CATH;  Surgeon: Jolaine Artist, MD;  Location: [facility] CATH LAB;  Service: Cardiovascular;  Laterality: N/A;  . TEE WITHOUT CARDIOVERSION N/A 01/05/2015   Procedure: TRANSESOPHAGEAL ECHOCARDIOGRAM (TEE);  Surgeon: Larey Dresser, MD;  Location: [place];  Service: Cardiovascular;  Laterality: N/A;  . WISDOM TOOTH EXTRACTION      SOCIAL HISTORY: Social History   Social History  . Marital status: Legally Separated    Spouse name: N/A  . Number of children: 1  . Years of education: N/A   Occupational History  . Disability     Office Work   Social History Main Topics  . Smoking status: Former Smoker    Packs/day: 1.50    Years: 14.00    Types: Cigarettes    Quit date: 08/09/2003  . Smokeless tobacco: Never Used  .  Alcohol use 0.0 oz/week     Comment: 02/20/2016 "drank a little in my teens"  . Drug use: No  . Sexual activity: No   Other Topics Concern  . Not on file   Social History Narrative  . No narrative on file    FAMILY HISTORY: Family History  Problem Relation Age of Onset  . Heart attack Mother   . Prostate cancer Father   . Diabetes Brother   . Kidney failure Brother     ALLERGIES:  has No Known Allergies.  MEDICATIONS:  No current facility-administered medications for this visit.    No  current outpatient prescriptions on file.   Facility-Administered Medications Ordered in Other Visits  Medication Dose Route Frequency Provider Last Rate Last Dose  . acetaminophen (TYLENOL) tablet 650 mg  650 mg Oral Q6H PRN Robbie Lis, MD      . albuterol (PROVENTIL) (2.5 MG/3ML) 0.083% nebulizer solution 2.5 mg  2.5 mg Nebulization Q4H PRN Gardiner Barefoot, NP      . atorvastatin (LIPITOR) tablet 10 mg  10 mg Oral Daily Lavina Hamman, MD   10 mg at 07/16/16 1004  . cefTRIAXone (ROCEPHIN) 2 g in dextrose 5 % 50 mL IVPB  2 g Intravenous Q24H Collene Gobble, MD   2 g at 07/16/16 1005  . furosemide (LASIX) injection 80 mg  80 mg Intravenous BID Satira Mccallum Tillery, PA-C   80 mg at 07/16/16 0800  . metoprolol (LOPRESSOR) injection 2.5-5 mg  2.5-5 mg Intravenous Q3H PRN Colbert Coyer, MD      . neomycin-polymyxin b-dexamethasone (MAXITROL) ophthalmic ointment 1 application  1 application Both Eyes QHS Lavina Hamman, MD   1 application at 19/50/93 2200  . omega-3 acid ethyl esters (LOVAZA) capsule 1 g  1 g Oral Daily Lavina Hamman, MD   1 g at 07/16/16 1005  . ondansetron (ZOFRAN) tablet 4 mg  4 mg Oral Q6H PRN Lavina Hamman, MD       Or  . ondansetron [facility]) injection 4 mg  4 mg Intravenous Q6H PRN Lavina Hamman, MD      . polyvinyl alcohol (LIQUIFILM TEARS) 1.4 % ophthalmic solution 1 drop  1 drop Both Eyes QHS Lavina Hamman, MD   1 drop at 07/15/16 2200  . potassium chloride SA (K-DUR,KLOR-CON) CR tablet 40 mEq  40 mEq Oral BID Amy D Clegg, NP   40 mEq at 07/16/16 1005  . sodium chloride flush (NS) 0.9 % injection 3 mL  3 mL Intravenous Q12H Lavina Hamman, MD   3 mL at 07/16/16 1005    REVIEW OF SYSTEMS:    10 Point review of Systems was done is negative except as noted above.  PHYSICAL EXAMINATION: ECOG PERFORMANCE STATUS: 3 - Symptomatic, >50% confined to bed  . Vitals:   05/28/16 0951  Weight: 206 lb (93.4 kg)  Height: 5' 4"  (1.626 m)   Filed Weights    05/28/16 0951  Weight: 206 lb (93.4 kg)   .Body mass index is 35.36 kg/m.  GENERAL:alert, in no acute distress and comfortable on [place] oxygen SKIN: skin color, texture, turgor are normal, no rashes or significant lesions EYES: normal, conjunctiva are pink and non-injected, sclera clear OROPHARYNX:no exudate, no erythema and lips, buccal mucosa, and tongue normal  NECK: supple, no JVD, thyroid normal size, non-tender, without nodularity LYMPH:  no palpable lymphadenopathy in the cervical, axillary or inguinal LUNGS: clear to auscultation with normal respiratory  effort HEART: regular rate & rhythm,  no murmurs and no lower extremity edema ABDOMEN: abdomen soft, non-tender, normoactive bowel sounds  Musculoskeletal: no cyanosis of digits and no clubbing  PSYCH: alert & oriented x 3 with fluent speech NEURO: no focal motor/sensory deficits  LABORATORY DATA:  I have reviewed the data as listed  . Component     Latest Ref Rng & Units 05/28/2016  WBC     3.9 - 10.3 10e3/uL 6.0  NEUT#     1.5 - 6.5 10e3/uL 4.9  Hemoglobin     11.6 - 15.9 g/dL 11.4 (L)  HCT     34.8 - 46.6 % 38.2  Platelets     145 - 400 10e3/uL 87 (L)  MCV     79.5 - 101.0 fL 97.4  MCH     25.1 - 34.0 pg 29.1  MCHC     31.5 - 36.0 g/dL 29.8 (L)  RBC     3.70 - 5.45 10e6/uL 3.92  RDW     11.2 - 14.5 % 18.2 (H)  lymph#     0.9 - 3.3 10e3/uL 0.7 (L)  MONO#     0.1 - 0.9 10e3/uL 0.4  Eosinophils Absolute     0.0 - 0.5 10e3/uL 0.0  Basophils Absolute     0.0 - 0.1 10e3/uL 0.0  NEUT%     38.4 - 76.8 % 81.1 (H)  LYMPH%     14.0 - 49.7 % 12.1 (L)  MONO%     0.0 - 14.0 % 6.5  EOS%     0.0 - 7.0 % 0.0  BASO%     0.0 - 2.0 % 0.3  Retic %     0.70 - 2.10 % 0.90  Retic Ct Abs     33.70 - 90.70 10e3/uL 35.28  Immature Retic Fract     1.60 - 10.00 % 1.60  Sodium     136 - 145 mEq/L 144  Potassium     3.5 - 5.1 mEq/L 4.5  Chloride     98 - 109 mEq/L 105  CO2     22 - 29 mEq/L 31 (H)  Glucose     70  - 140 mg/dl 119  BUN     7.0 - 26.0 mg/dL 36.4 (H)  Creatinine     0.6 - 1.1 mg/dL 2.2 (H)  Total Bilirubin     0.20 - 1.20 mg/dL 0.36  Alkaline Phosphatase     40 - 150 U/L 128  AST     5 - 34 U/L 17  ALT     0 - 55 U/L 13  Total Protein     6.4 - 8.3 g/dL 7.9  Albumin     3.5 - 5.0 g/dL 3.3 (L)  Calcium     8.4 - 10.4 mg/dL 10.9 (H)  Anion gap     3 - 11 mEq/L 8  EGFR     >90 ml/min/1.73 m2 26 (L)  Iron     41 - 142 ug/dL 52  TIBC     236 - 444 ug/dL 266  UIBC     120 - 384 ug/dL 215  %SAT     21 - 57 % 19 (L)  Ferritin     9 - 269 ng/ml 338 (H)    RADIOGRAPHIC STUDIES: I have personally reviewed the radiological images as listed and agreed with the findings in the report. No results found.  ASSESSMENT & PLAN:   70 year old  female with multiple medical comorbidities including hypertension, COPD, CHF, gout, rheumatoid arthritis, moderate mitral regurgitation with  #1 Normocytic Normochromic anemia.-Her anemia is likely multifactorial from her chronic kidney disease plus rheumatoid arthritis. Her allopurinol and plaquenil could certainly be an additional factors especially if there is worsening renal function. 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 Also has intermittent acute on chronic GI bleeds while on anticoagulation with her Eliquis with the last one being in July 2017 requiring 5 units of PRBCs. She remains off her anticoagulation. Ferritin level was 338 and hgb is up to 11.4 . SPEP with no monoclonal protein. No clinical evidence of splenomegaly to suggest overt hypersplenism. Hemoglobin stable at 9.2 after recent hospitalization in July .  #2 Mild thrombocytopenia -stable  #2 rheumatoid arthritis #3 chronic kidney disease #4 pulmonary hypertension #5 CHF  Plan -No indication for additional IV iron at this time. -Hemoglobin is more than 11 Will hold Aranesp dose today. -Labs in the first week of December  2017 And then early Jan 2018 RTC with Dr Irene Limbo in 22month -Aranesp scheduling with lab dates (to be given if Hgb<11) -we will schedule additional IV iron based on labs -Continue optimal treatment of rheumatoid arthritis -Continue follow-up with primary care physician for other medical cares.   All of the patients questions were answered to her apparent satisfaction. The patient knows to call the clinic with any problems, questions or concerns.  I spent 20 minutes counseling the patient face to face. The total time spent in the appointment was 20 minutes and more than 50% was on counseling and direct patient cares and reviewed her extensive inpatient records and results    GSullivan LoneMD MPalmview SouthAAHIVMS SLakeview HospitalCOrthoatlanta Surgery Center Of Austell LLCHematology/Oncology Physician [facility] (Office):       [phone](Work cell):  [phone](Fax):           [phone]

## 2016-07-12 ENCOUNTER — Emergency Department (HOSPITAL_COMMUNITY): Payer: Medicare Other

## 2016-07-12 ENCOUNTER — Inpatient Hospital Stay (HOSPITAL_COMMUNITY)
Admission: EM | Admit: 2016-07-12 | Discharge: 2016-07-23 | DRG: 871 | Disposition: A | Discharge status: Skilled Nursing Facility | Payer: Medicare Other | Attending: Internal Medicine | Admitting: Internal Medicine

## 2016-07-12 ENCOUNTER — Telehealth: Payer: Self-pay | Admitting: *Deleted

## 2016-07-12 ENCOUNTER — Other Ambulatory Visit: Payer: Self-pay

## 2016-07-12 ENCOUNTER — Encounter (HOSPITAL_COMMUNITY): Payer: Self-pay | Admitting: Emergency Medicine

## 2016-07-12 DIAGNOSIS — A419 Sepsis, unspecified organism: Secondary | ICD-10-CM | POA: Diagnosis present

## 2016-07-12 DIAGNOSIS — N390 Urinary tract infection, site not specified: Secondary | ICD-10-CM | POA: Diagnosis present

## 2016-07-12 DIAGNOSIS — E785 Hyperlipidemia, unspecified: Secondary | ICD-10-CM | POA: Diagnosis present

## 2016-07-12 DIAGNOSIS — Z9981 Dependence on supplemental oxygen: Secondary | ICD-10-CM

## 2016-07-12 DIAGNOSIS — N170 Acute kidney failure with tubular necrosis: Secondary | ICD-10-CM | POA: Diagnosis present

## 2016-07-12 DIAGNOSIS — E860 Dehydration: Secondary | ICD-10-CM | POA: Diagnosis present

## 2016-07-12 DIAGNOSIS — I2722 Pulmonary hypertension due to left heart disease: Secondary | ICD-10-CM | POA: Diagnosis present

## 2016-07-12 DIAGNOSIS — Z8249 Family history of ischemic heart disease and other diseases of the circulatory system: Secondary | ICD-10-CM

## 2016-07-12 DIAGNOSIS — D631 Anemia in chronic kidney disease: Secondary | ICD-10-CM | POA: Diagnosis present

## 2016-07-12 DIAGNOSIS — G4733 Obstructive sleep apnea (adult) (pediatric): Secondary | ICD-10-CM

## 2016-07-12 DIAGNOSIS — N183 Chronic kidney disease, stage 3 unspecified: Secondary | ICD-10-CM | POA: Diagnosis present

## 2016-07-12 DIAGNOSIS — R319 Hematuria, unspecified: Secondary | ICD-10-CM

## 2016-07-12 DIAGNOSIS — I2721 Secondary pulmonary arterial hypertension: Secondary | ICD-10-CM | POA: Diagnosis present

## 2016-07-12 DIAGNOSIS — I959 Hypotension, unspecified: Secondary | ICD-10-CM

## 2016-07-12 DIAGNOSIS — I4819 Other persistent atrial fibrillation: Secondary | ICD-10-CM | POA: Diagnosis present

## 2016-07-12 DIAGNOSIS — A4151 Sepsis due to Escherichia coli [E. coli]: Secondary | ICD-10-CM | POA: Diagnosis present

## 2016-07-12 DIAGNOSIS — I130 Hypertensive heart and chronic kidney disease with heart failure and stage 1 through stage 4 chronic kidney disease, or unspecified chronic kidney disease: Secondary | ICD-10-CM | POA: Diagnosis present

## 2016-07-12 DIAGNOSIS — I509 Heart failure, unspecified: Secondary | ICD-10-CM | POA: Diagnosis not present

## 2016-07-12 DIAGNOSIS — R509 Fever, unspecified: Secondary | ICD-10-CM

## 2016-07-12 DIAGNOSIS — I5033 Acute on chronic diastolic (congestive) heart failure: Secondary | ICD-10-CM | POA: Diagnosis present

## 2016-07-12 DIAGNOSIS — J449 Chronic obstructive pulmonary disease, unspecified: Secondary | ICD-10-CM | POA: Diagnosis not present

## 2016-07-12 DIAGNOSIS — R6520 Severe sepsis without septic shock: Secondary | ICD-10-CM | POA: Diagnosis present

## 2016-07-12 DIAGNOSIS — I248 Other forms of acute ischemic heart disease: Secondary | ICD-10-CM | POA: Diagnosis present

## 2016-07-12 DIAGNOSIS — R0602 Shortness of breath: Secondary | ICD-10-CM

## 2016-07-12 DIAGNOSIS — M109 Gout, unspecified: Secondary | ICD-10-CM | POA: Diagnosis present

## 2016-07-12 DIAGNOSIS — Z841 Family history of disorders of kidney and ureter: Secondary | ICD-10-CM | POA: Diagnosis not present

## 2016-07-12 DIAGNOSIS — Z9989 Dependence on other enabling machines and devices: Secondary | ICD-10-CM

## 2016-07-12 DIAGNOSIS — I10 Essential (primary) hypertension: Secondary | ICD-10-CM | POA: Diagnosis present

## 2016-07-12 DIAGNOSIS — I2510 Atherosclerotic heart disease of native coronary artery without angina pectoris: Secondary | ICD-10-CM | POA: Diagnosis present

## 2016-07-12 DIAGNOSIS — Z87891 Personal history of nicotine dependence: Secondary | ICD-10-CM

## 2016-07-12 DIAGNOSIS — E872 Acidosis: Secondary | ICD-10-CM | POA: Diagnosis present

## 2016-07-12 DIAGNOSIS — I5043 Acute on chronic combined systolic (congestive) and diastolic (congestive) heart failure: Secondary | ICD-10-CM | POA: Diagnosis not present

## 2016-07-12 DIAGNOSIS — E861 Hypovolemia: Secondary | ICD-10-CM | POA: Diagnosis present

## 2016-07-12 DIAGNOSIS — J9611 Chronic respiratory failure with hypoxia: Secondary | ICD-10-CM | POA: Diagnosis present

## 2016-07-12 DIAGNOSIS — E876 Hypokalemia: Secondary | ICD-10-CM | POA: Diagnosis not present

## 2016-07-12 DIAGNOSIS — I481 Persistent atrial fibrillation: Secondary | ICD-10-CM | POA: Diagnosis present

## 2016-07-12 DIAGNOSIS — T501X5A Adverse effect of loop [high-ceiling] diuretics, initial encounter: Secondary | ICD-10-CM | POA: Diagnosis not present

## 2016-07-12 DIAGNOSIS — Z833 Family history of diabetes mellitus: Secondary | ICD-10-CM

## 2016-07-12 DIAGNOSIS — D6959 Other secondary thrombocytopenia: Secondary | ICD-10-CM | POA: Diagnosis present

## 2016-07-12 DIAGNOSIS — D696 Thrombocytopenia, unspecified: Secondary | ICD-10-CM | POA: Diagnosis present

## 2016-07-12 DIAGNOSIS — D649 Anemia, unspecified: Secondary | ICD-10-CM | POA: Diagnosis not present

## 2016-07-12 DIAGNOSIS — I493 Ventricular premature depolarization: Secondary | ICD-10-CM | POA: Diagnosis not present

## 2016-07-12 DIAGNOSIS — D469 Myelodysplastic syndrome, unspecified: Secondary | ICD-10-CM | POA: Diagnosis not present

## 2016-07-12 DIAGNOSIS — J9691 Respiratory failure, unspecified with hypoxia: Secondary | ICD-10-CM

## 2016-07-12 DIAGNOSIS — R197 Diarrhea, unspecified: Secondary | ICD-10-CM | POA: Diagnosis not present

## 2016-07-12 DIAGNOSIS — I5032 Chronic diastolic (congestive) heart failure: Secondary | ICD-10-CM | POA: Diagnosis present

## 2016-07-12 DIAGNOSIS — Z79899 Other long term (current) drug therapy: Secondary | ICD-10-CM

## 2016-07-12 DIAGNOSIS — M069 Rheumatoid arthritis, unspecified: Secondary | ICD-10-CM | POA: Diagnosis present

## 2016-07-12 DIAGNOSIS — J9621 Acute and chronic respiratory failure with hypoxia: Secondary | ICD-10-CM

## 2016-07-12 LAB — URINALYSIS, ROUTINE W REFLEX MICROSCOPIC
BILIRUBIN URINE: NEGATIVE
Bilirubin Urine: NEGATIVE
GLUCOSE, UA: NEGATIVE mg/dL
GLUCOSE, UA: NEGATIVE mg/dL
KETONES UR: NEGATIVE mg/dL
Ketones, ur: NEGATIVE mg/dL
NITRITE: NEGATIVE
NITRITE: NEGATIVE
PH: 5 (ref 5.0–8.0)
PROTEIN: 100 mg/dL — AB
Protein, ur: 30 mg/dL — AB
SPECIFIC GRAVITY, URINE: 1.01 (ref 1.005–1.030)
Specific Gravity, Urine: 1.011 (ref 1.005–1.030)
pH: 5 (ref 5.0–8.0)

## 2016-07-12 LAB — I-STAT TROPONIN, ED: Troponin i, poc: 0.01 ng/mL (ref 0.00–0.08)

## 2016-07-12 LAB — PROTIME-INR
INR: 1.41
PROTHROMBIN TIME: 17.4 s — AB (ref 11.4–15.2)

## 2016-07-12 LAB — CBC WITH DIFFERENTIAL/PLATELET
BASOS PCT: 0 %
Basophils Absolute: 0 10*3/uL (ref 0.0–0.1)
Basophils Absolute: 0 10*3/uL (ref 0.0–0.1)
Basophils Relative: 0 %
EOS ABS: 0 10*3/uL (ref 0.0–0.7)
Eosinophils Absolute: 0 10*3/uL (ref 0.0–0.7)
Eosinophils Relative: 0 %
Eosinophils Relative: 0 %
HEMATOCRIT: 37.4 % (ref 36.0–46.0)
HEMATOCRIT: 33.9 % — AB (ref 36.0–46.0)
HEMOGLOBIN: 11.4 g/dL — AB (ref 12.0–15.0)
HEMOGLOBIN: 10.4 g/dL — AB (ref 12.0–15.0)
LYMPHS ABS: 0.6 10*3/uL — AB (ref 0.7–4.0)
LYMPHS PCT: 4 %
Lymphocytes Relative: 3 %
Lymphs Abs: 0.7 10*3/uL (ref 0.7–4.0)
MCH: 29.5 pg (ref 26.0–34.0)
MCH: 30.1 pg (ref 26.0–34.0)
MCHC: 30.5 g/dL (ref 30.0–36.0)
MCHC: 30.7 g/dL (ref 30.0–36.0)
MCV: 96.9 fL (ref 78.0–100.0)
MCV: 98 fL (ref 78.0–100.0)
MONOS PCT: 7 %
Monocytes Absolute: 1.3 10*3/uL — ABNORMAL HIGH (ref 0.1–1.0)
Monocytes Absolute: 1.1 10*3/uL — ABNORMAL HIGH (ref 0.1–1.0)
Monocytes Relative: 5 %
NEUTROS ABS: 15.6 10*3/uL — AB (ref 1.7–7.7)
NEUTROS ABS: 19.5 10*3/uL — AB (ref 1.7–7.7)
NEUTROS PCT: 89 %
NEUTROS PCT: 92 %
Platelets: 68 10*3/uL — ABNORMAL LOW (ref 150–400)
Platelets: 75 10*3/uL — ABNORMAL LOW (ref 150–400)
RBC: 3.86 MIL/uL — ABNORMAL LOW (ref 3.87–5.11)
RBC: 3.46 MIL/uL — AB (ref 3.87–5.11)
RDW: 16.1 % — ABNORMAL HIGH (ref 11.5–15.5)
RDW: 16.5 % — ABNORMAL HIGH (ref 11.5–15.5)
WBC: 17.5 10*3/uL — ABNORMAL HIGH (ref 4.0–10.5)
WBC: 21.3 10*3/uL — AB (ref 4.0–10.5)

## 2016-07-12 LAB — TECHNOLOGIST SMEAR REVIEW

## 2016-07-12 LAB — BRAIN NATRIURETIC PEPTIDE: B Natriuretic Peptide: 318.6 pg/mL — ABNORMAL HIGH (ref 0.0–100.0)

## 2016-07-12 LAB — TYPE AND SCREEN
ABO/RH(D): O POS
ANTIBODY SCREEN: NEGATIVE

## 2016-07-12 LAB — COMPREHENSIVE METABOLIC PANEL
ALBUMIN: 3.1 g/dL — AB (ref 3.5–5.0)
ALT: 14 U/L (ref 14–54)
ANION GAP: 7 (ref 5–15)
AST: 20 U/L (ref 15–41)
Alkaline Phosphatase: 107 U/L (ref 38–126)
BUN: 46 mg/dL — ABNORMAL HIGH (ref 6–20)
CHLORIDE: 103 mmol/L (ref 101–111)
CO2: 27 mmol/L (ref 22–32)
Calcium: 10.1 mg/dL (ref 8.9–10.3)
Creatinine, Ser: 2.94 mg/dL — ABNORMAL HIGH (ref 0.44–1.00)
GFR calc non Af Amer: 15 mL/min — ABNORMAL LOW (ref >60)
GFR, EST AFRICAN AMERICAN: 18 mL/min — AB (ref >60)
GLUCOSE: 116 mg/dL — AB (ref 65–99)
Potassium: 4.5 mmol/L (ref 3.5–5.1)
SODIUM: 137 mmol/L (ref 135–145)
Total Bilirubin: 0.9 mg/dL (ref 0.3–1.2)
Total Protein: 7.3 g/dL (ref 6.5–8.1)

## 2016-07-12 LAB — PROCALCITONIN: Procalcitonin: 40.47 ng/mL

## 2016-07-12 LAB — LIPASE, BLOOD: Lipase: 15 U/L (ref 11–51)

## 2016-07-12 LAB — MRSA PCR SCREENING: MRSA by PCR: NEGATIVE

## 2016-07-12 LAB — I-STAT CG4 LACTIC ACID, ED
LACTIC ACID, VENOUS: 1.96 mmol/L — AB (ref 0.5–1.9)
LACTIC ACID, VENOUS: 1.23 mmol/L (ref 0.5–1.9)

## 2016-07-12 LAB — FIBRINOGEN: Fibrinogen: 554 mg/dL — ABNORMAL HIGH (ref 210–475)

## 2016-07-12 LAB — INFLUENZA PANEL BY PCR (TYPE A & B)
INFLAPCR: NEGATIVE
Influenza B By PCR: NEGATIVE

## 2016-07-12 LAB — APTT: aPTT: 30 seconds (ref 24–36)

## 2016-07-12 MED ORDER — SODIUM CHLORIDE 0.9 % IV BOLUS (SEPSIS)
1000.0000 mL | Freq: Once | INTRAVENOUS | Status: AC
  Administered 2016-07-12: 1000 mL via INTRAVENOUS

## 2016-07-12 MED ORDER — ALBUTEROL SULFATE (2.5 MG/3ML) 0.083% IN NEBU: 2.5000 mg | INHALATION_SOLUTION | RESPIRATORY_TRACT | Status: DC | PRN

## 2016-07-12 MED ORDER — PIPERACILLIN-TAZOBACTAM 3.375 G IVPB 30 MIN
3.3750 g | Freq: Once | INTRAVENOUS | Status: AC
Start: 2016-07-12 — End: 2016-07-12
  Administered 2016-07-12: 3.375 g via INTRAVENOUS
  Filled 2016-07-12: qty 50

## 2016-07-12 MED ORDER — ONDANSETRON HCL 4 MG PO TABS: 4.0000 mg | ORAL_TABLET | Freq: Four times a day (QID) | ORAL | Status: DC | PRN

## 2016-07-12 MED ORDER — ACETAMINOPHEN 500 MG PO TABS
1000.0000 mg | ORAL_TABLET | Freq: Once | ORAL | Status: AC
  Administered 2016-07-12: 1000 mg via ORAL
  Filled 2016-07-12: qty 2

## 2016-07-12 MED ORDER — ATORVASTATIN CALCIUM 10 MG PO TABS
10.0000 mg | ORAL_TABLET | Freq: Every day | ORAL | Status: DC
Start: 2016-07-12 — End: 2016-07-23
  Administered 2016-07-12 – 2016-07-23 (×12): 10 mg via ORAL
  Filled 2016-07-12 (×12): qty 1

## 2016-07-12 MED ORDER — ALLOPURINOL 300 MG PO TABS: 300.0000 mg | ORAL_TABLET | Freq: Every day | ORAL | Status: DC

## 2016-07-12 MED ORDER — NEOMYCIN-POLYMYXIN-DEXAMETH 3.5-10000-0.1 OP OINT
1.0000 "application " | TOPICAL_OINTMENT | Freq: Every day | OPHTHALMIC | Status: DC
  Administered 2016-07-12 – 2016-07-17 (×5): 1 via OPHTHALMIC
  Filled 2016-07-12 (×2): qty 3.5

## 2016-07-12 MED ORDER — VANCOMYCIN HCL IN DEXTROSE 1-5 GM/200ML-% IV SOLN: 1000.0000 mg | INTRAVENOUS | Status: DC

## 2016-07-12 MED ORDER — PIPERACILLIN-TAZOBACTAM 3.375 G IVPB
3.3750 g | Freq: Three times a day (TID) | INTRAVENOUS | Status: DC
  Administered 2016-07-12 – 2016-07-13 (×2): 3.375 g via INTRAVENOUS
  Filled 2016-07-12 (×4): qty 50

## 2016-07-12 MED ORDER — ACETAMINOPHEN 325 MG PO TABS
650.0000 mg | ORAL_TABLET | Freq: Four times a day (QID) | ORAL | Status: DC | PRN
  Administered 2016-07-13 – 2016-07-14 (×3): 650 mg via ORAL
  Filled 2016-07-12 (×3): qty 2

## 2016-07-12 MED ORDER — POLYVINYL ALCOHOL 1.4 % OP SOLN
1.0000 [drp] | Freq: Every day | OPHTHALMIC | Status: DC
  Administered 2016-07-15 – 2016-07-22 (×7): 1 [drp] via OPHTHALMIC
  Filled 2016-07-12 (×3): qty 15

## 2016-07-12 MED ORDER — SODIUM CHLORIDE 0.9 % IV SOLN
INTRAVENOUS | Status: DC
  Administered 2016-07-12: 21:00:00 via INTRAVENOUS

## 2016-07-12 MED ORDER — FISH OIL 1200 MG PO CAPS: 1200.0000 mg | ORAL_CAPSULE | Freq: Every day | ORAL | Status: DC

## 2016-07-12 MED ORDER — CARBOXYMETHYLCELLULOSE SODIUM 0.5 % OP SOLN: 1.0000 [drp] | Freq: Every day | OPHTHALMIC | Status: DC

## 2016-07-12 MED ORDER — IPRATROPIUM-ALBUTEROL 0.5-2.5 (3) MG/3ML IN SOLN
RESPIRATORY_TRACT | Status: AC
  Administered 2016-07-12: 3 mL
  Filled 2016-07-12: qty 3

## 2016-07-12 MED ORDER — OMEGA-3-ACID ETHYL ESTERS 1 G PO CAPS
1.0000 g | ORAL_CAPSULE | Freq: Every day | ORAL | Status: DC
  Administered 2016-07-12 – 2016-07-23 (×12): 1 g via ORAL
  Filled 2016-07-12 (×12): qty 1

## 2016-07-12 MED ORDER — ACETAMINOPHEN 650 MG RE SUPP: 650.0000 mg | Freq: Four times a day (QID) | RECTAL | Status: DC | PRN

## 2016-07-12 MED ORDER — VANCOMYCIN HCL IN DEXTROSE 1-5 GM/200ML-% IV SOLN
1000.0000 mg | Freq: Once | INTRAVENOUS | Status: AC
Start: 2016-07-12 — End: 2016-07-12
  Administered 2016-07-12: 1000 mg via INTRAVENOUS
  Filled 2016-07-12: qty 200

## 2016-07-12 MED ORDER — SODIUM CHLORIDE 0.9% FLUSH
3.0000 mL | Freq: Two times a day (BID) | INTRAVENOUS | Status: DC
  Administered 2016-07-12 – 2016-07-17 (×10): 3 mL via INTRAVENOUS
  Administered 2016-07-17: 21:00:00 via INTRAVENOUS
  Administered 2016-07-18 – 2016-07-23 (×10): 3 mL via INTRAVENOUS

## 2016-07-12 MED ORDER — ONDANSETRON HCL 4 MG/2ML IJ SOLN: 4.0000 mg | Freq: Four times a day (QID) | INTRAMUSCULAR | Status: DC | PRN

## 2016-07-12 MED ORDER — ALPRAZOLAM 0.25 MG PO TABS
0.2500 mg | ORAL_TABLET | Freq: Two times a day (BID) | ORAL | Status: DC | PRN
  Administered 2016-07-12: 0.25 mg via ORAL
  Filled 2016-07-12: qty 1

## 2016-07-12 NOTE — H&P (Addendum)
[facility] History and Physical   Patient: April Mueller [id number]   PCP: Melinda Crutch, MD (Inactive) DOB: 08-Mar-1946   DOA: 07/12/2016   DOS: 07/12/2016   DOS: the patient was seen and examined on 07/12/2016  Patient coming from: The patient is coming from home.  Chief Complaint: Fever and chills  HPI: April Mueller is a 70 y.o. female with Past medical history of CKD 3, COPD, chronic respiratory failure, pulmonary hypertension. Patient presented with complaints of fever and chills as well as nausea. Patient reported poor appetite for last 2 days. She mentions that she has been taking all her medications on regular basis. No cough but did have some shortness of breath. Patient was oriented about some dizziness and lightheadedness. No focal deficit.  ED Course: Patient was hypotensive as well as had leukocytosis meeting sepsis criteria with fever. Source unknown. Patient was given 3 L of IV fluid bolus. Critical care consulted recommended the patient can be admitted to step down unit.  At her baseline ambulates with walker And is independent for most of her ADL; manages her medication on her own.  Review of Systems: as mentioned in the history of present illness.  All other systems reviewed and are negative.  Past Medical History:  Diagnosis Date  . Anemia 03/23/15   transfusion  . Anxiety   . Aortic aneurysm ([place])   . Arthritis    "qwhere" (02/20/2016)  . CHF (congestive heart failure) ([place])   . Chronic lower back pain   . CKD (chronic kidney disease) stage 3, GFR 30-59 ml/min    "on dialysis for 3 months after my aneurysm in 2005" (02/20/2016)  . COPD (chronic obstructive pulmonary disease) ([place])   . Gout   . Heart murmur   . History of blood transfusion "several"  . Hypertension   . Lupus   . On home oxygen therapy    "3L; 24/7" (02/20/2016)  . OSA on CPAP   . Pulmonary HTN   . Rheumatoid arthritis(714.0) 11/06/2012  . Tuberculosis    dormant carrier    Past Surgical History:  Procedure Laterality Date  . ABDOMINAL AORTIC ANEURYSM REPAIR  2005  . BREAST LUMPECTOMY Right   . CARDIAC CATHETERIZATION N/A 10/24/2015   Procedure: Right Heart Cath;  Surgeon: Larey Dresser, MD;  Location: [place] CV LAB;  Service: Cardiovascular;  Laterality: N/A;  . CATARACT EXTRACTION W/ INTRAOCULAR LENS  IMPLANT, BILATERAL Bilateral 2016  . COLONOSCOPY N/A 08/19/2015   Procedure: COLONOSCOPY;  Surgeon: Jerene Bears, MD;  Location[facility] ENDOSCOPY;  Service: Endoscopy;  Laterality: N/A;  . ENTEROSCOPY N/A 02/22/2016   Procedure: ENTEROSCOPY;  Surgeon: Milus Banister, MD;  Location: [place];  Service: Endoscopy;  Laterality: N/A;  . ESOPHAGOGASTRODUODENOSCOPY N/A 08/19/2015   Procedure: ESOPHAGOGASTRODUODENOSCOPY (EGD);  Surgeon: Jerene Bears, MD;  Location: [facility] ENDOSCOPY;  Service: Endoscopy;  Laterality: N/A;  patient scheduled, anesthesia aware of 1500 case, per Tabatha.  08/18/15 DP  . GIVENS CAPSULE STUDY N/A 08/21/2015   Procedure: GIVENS CAPSULE STUDY;  Surgeon: Gatha Mayer, MD;  Location: [place];  Service: Endoscopy;  Laterality: N/A;  . HEMORRHOID SURGERY    . RIGHT HEART CATHETERIZATION N/A 08/18/2014   Procedure: RIGHT HEART CATH;  Surgeon: Jolaine Artist, MD;  Location: [facility] CATH LAB;  Service: Cardiovascular;  Laterality: N/A;  . TEE WITHOUT CARDIOVERSION N/A 01/05/2015   Procedure: TRANSESOPHAGEAL ECHOCARDIOGRAM (TEE);  Surgeon: Larey Dresser, MD;  Location: [place];  Service: Cardiovascular;  Laterality: N/A;  . WISDOM TOOTH EXTRACTION     Social History:  reports that she quit smoking about 12 years ago. Her smoking use included Cigarettes. She has a 21.00 pack-year smoking history. She has never used smokeless tobacco. She reports that she drinks alcohol. She reports that she does not use drugs.  No Known Allergies   Family History  Problem Relation Age of Onset  . Heart attack Mother   . Prostate cancer Father   .  Diabetes Brother   . Kidney failure Brother      Prior to Admission medications   Medication Sig Start Date End Date Taking? Authorizing Provider  acetaminophen (TYLENOL) 325 MG tablet Take 2 tablets (650 mg total) by mouth every 6 (six) hours as needed for mild pain (or Fever >/= 101). 08/25/15  Yes Albertine Patricia, MD  allopurinol (ZYLOPRIM) 300 MG tablet Take 300 mg by mouth daily.   Yes Historical Provider, MD  ALPRAZolam (XANAX) 0.25 MG tablet Take 0.25 mg by mouth 2 (two) times daily as needed for anxiety or sleep.    Yes Historical Provider, MD  atorvastatin (LIPITOR) 10 MG tablet TAKE 1 TABLET DAILY 03/14/16  Yes Larey Dresser, MD  carboxymethylcellulose (REFRESH PLUS) 0.5 % SOLN Place 1 drop into both eyes at bedtime.   Yes Historical Provider, MD  hydroxychloroquine (PLAQUENIL) 200 MG tablet Take 200 mg by mouth 2 (two) times daily.   Yes Historical Provider, MD  metoprolol succinate (TOPROL-XL) 25 MG 24 hr tablet TAKE 1 TABLET TWICE A DAY 06/05/16  Yes Larey Dresser, MD  neomycin-polymyxin b-dexamethasone (MAXITROL) 3.5-10000-0.1 OINT Place 1 application into both eyes at bedtime.   Yes Historical Provider, MD  Omega-3 Fatty Acids (FISH OIL) 1200 MG CAPS Take 1,200 mg by mouth daily.   Yes Historical Provider, MD  OXYGEN Inhale 3-6 L into the lungs continuous.    Yes Historical Provider, MD  potassium chloride (K-DUR) 10 MEQ tablet Take 4 tabs in AM and 2 tabs in PM Patient taking differently: Take 10 mEq by mouth See admin instructions. Take 4 tabs in AM and 2 tabs in PM 04/11/16  Yes Larey Dresser, MD  torsemide (DEMADEX) 20 MG tablet Take 4 tablets in the AM and 3 tablets in the PM Patient taking differently: Take 20 mg by mouth See admin instructions. Take 4 tablets in the AM and 3 tablets in the PM 05/17/16  Yes Larey Dresser, MD    Physical Exam: Vitals:   07/12/16 1745 07/12/16 1800 07/12/16 1815 07/12/16 1856  BP: 91/60 97/64 95/63  108/62  Pulse: 71 63 64 71    Resp: (!) 28 22 26    Temp:    98.2 F (36.8 C)  TempSrc:    Oral  SpO2: 91% 99% 98% 100%  Weight:    100.7 kg (222 lb 1.6 oz)  Height:    5\' 4"  (1.626 m)    General: Alert, Awake and Oriented to Time, Place and Person. Appear in moderate distress, affect appropriate Eyes: PERRL, Conjunctiva normal ENT: Oral Mucosa clear moist. Neck: difficult to assess JVD, no Abnormal Mass Or lumps Cardiovascular: S1 and S2 Present, aortic systolic Murmur, Peripheral Pulses Present Respiratory: Bilateral Air entry equal and Decreased, no use of accessory muscle, Clear to Auscultation, no Crackles, no wheezes Abdomen: Bowel Sound present, Soft and no tenderness Skin: no redness, no Rash, no induration Extremities: no Pedal edema, no calf tenderness Neurologic: Grossly no focal neuro deficit. Bilaterally Equal motor  strength  Labs on Admission:  CBC:  Recent Labs Lab 07/09/16 1000 07/12/16 1446  WBC 5.9 17.5*  NEUTROABS 4.6 15.6*  HGB 11.4* 11.4*  HCT 37.9 37.4  MCV 98.4 96.9  PLT 88* 68*   Basic Metabolic Panel:  Recent Labs Lab 07/09/16 1000 07/12/16 1446  NA 144 137  K 4.2 4.5  CL  --  103  CO2 30* 27  GLUCOSE 118 116*  BUN 39.0* 46*  CREATININE 2.0* 2.94*  CALCIUM 10.8* 10.1   GFR: Estimated Creatinine Clearance: 21.1 mL/min (by C-G formula based on SCr of 2.94 mg/dL (H)). Liver Function Tests:  Recent Labs Lab 07/09/16 1000 07/12/16 1446  AST 16 20  ALT 12 14  ALKPHOS 127 107  BILITOT 0.59 0.9  PROT 7.6 7.3  ALBUMIN 3.2* 3.1*    Recent Labs Lab 07/12/16 1446  LIPASE 15   No results for input(s): AMMONIA in the last 168 hours. Coagulation Profile: No results for input(s): INR, PROTIME in the last 168 hours. Cardiac Enzymes: No results for input(s): CKTOTAL, CKMB, CKMBINDEX, TROPONINI in the last 168 hours. BNP (last 3 results) No results for input(s): PROBNP in the last 8760 hours. HbA1C: No results for input(s): HGBA1C in the last 72 hours. CBG: No  results for input(s): GLUCAP in the last 168 hours. Lipid Profile: No results for input(s): CHOL, HDL, LDLCALC, TRIG, CHOLHDL, LDLDIRECT in the last 72 hours. Thyroid Function Tests: No results for input(s): TSH, T4TOTAL, FREET4, T3FREE, THYROIDAB in the last 72 hours. Anemia Panel: No results for input(s): VITAMINB12, FOLATE, FERRITIN, TIBC, IRON, RETICCTPCT in the last 72 hours. Urine analysis:    Component Value Date/Time   COLORURINE YELLOW 07/12/2016 1521   APPEARANCEUR CLOUDY (A) 07/12/2016 1521   LABSPEC 1.011 07/12/2016 1521   PHURINE 5.0 07/12/2016 1521   GLUCOSEU NEGATIVE 07/12/2016 1521   HGBUR LARGE (A) 07/12/2016 1521   BILIRUBINUR NEGATIVE 07/12/2016 1521   KETONESUR NEGATIVE 07/12/2016 1521   PROTEINUR 100 (A) 07/12/2016 1521   NITRITE NEGATIVE 07/12/2016 1521   LEUKOCYTESUR LARGE (A) 07/12/2016 1521    Radiological Exams on Admission: Dg Chest Port 1 View  Result Date: 07/12/2016 CLINICAL DATA:  Sepsis.  Fever, shortness of Breath EXAM: PORTABLE CHEST 1 VIEW COMPARISON:  02/20/2016 FINDINGS: Cardiomegaly with vascular congestion and mild bilateral airspace opacities, left slightly greater than right. I favor this represents asymmetric edema. No visible effusions or acute bony abnormality. IMPRESSION: Suspect mild asymmetric pulmonary edema. Electronically Signed   By: Rolm Baptise M.D.   On: 07/12/2016 15:49    Assessment/Plan 1. Suspected sepsis. With fever leukocytosis and tachycardia as well as hypotension patient is meeting sepsis criteria. Source is unknown but possible UTI cannot be ruled out. Chest x-ray also shows asymmetric edema. Currently on broad-spectrum antibiotics vancomycin and Zosyn. Monitor culture. Recheck UA.  2. Thrombocytopenia. Platelet count significantly low. Etiology currently unclear. No active bleeding. Check fibrinogen INR and APTT. Type and screen. Supportive management. Likely in the setting of sepsis.  3. History of  chronic diastolic heart failure. History of chronic pulmonary hypertension. Currently the patient has received 2 L of normal saline bolus and will continue to receive IV fluid per critical care. We'll continue to closely monitor the patient for volume overload. Holding antihypertensive medication as well as diuretic.  4. Chronic kidney disease stage III. Serum creatinine currently mildly worsened due to dehydration in the setting of sepsis and nausea We'll continue to monitor.  5. OSA  Continue home C Pap  6. Rheumatoid arthritis. The setting of thrombocytopenia as well as active sepsis we will hold Plaquenil.  7. A. fib. Not on any anticoagulation due to recurrent GI bleed in the past.  8. 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 Patient's chart has myelodysplastic syndrome on her list of problem. Need to continue to monitor while the patient has developed bicytopenia.  Nutrition: Cardiac diet DVT Prophylaxis: mechanical compression device  Advance goals of care discussion: Full code   Consults: Critical care  Family Communication: no family was present at bedside, at the time of interview.  Disposition: Admitted as inpatient, step-down unit. Likely to be discharged home, in 4 days.  Author: Berle Mull, MD [facility] Pager: [phone] 07/12/2016  If 7PM-7AM, please contact night-coverage [email or web address] Password [email or web address]

## 2016-07-12 NOTE — Progress Notes (Signed)
\  Pharmacy Antibiotic Note  April Mueller is a 70 y.o. female admitted on 07/12/2016 with sepsis.  Pharmacy has been consulted for vancomycin and zosyn dosing.  Plan: Vancomycin 1 g q48h Zosyn 3.375 gm Iv q8h Monitor renal fx, cx, vt prn  Weight: 205 lb 0.4 oz (93 kg)  Temp (24hrs), Avg:101 F (38.3 C), Min:99.3 F (37.4 C), Max:102.6 F (39.2 C)   Recent Labs Lab 07/09/16 1000 07/12/16 1446 07/12/16 1447  WBC 5.9 17.5*  --   CREATININE 2.0* 2.94*  --   LATICACIDVEN  --   --  1.96*    Estimated Creatinine Clearance: 20.2 mL/min (by C-G formula based on SCr of 2.94 mg/dL (H)).    No Known Allergies  Levester Fresh, PharmD, BCPS, [place] Clinical Pharmacist Clinical phone for 07/12/2016 [phone]

## 2016-07-12 NOTE — ED Notes (Signed)
Margarita Mail PA at bedside

## 2016-07-12 NOTE — ED Notes (Signed)
Per Dr. Titus Mould, pt BP okay at this time, will not start pressers. Pt is mentating well, stated to restart patients fluids.

## 2016-07-12 NOTE — ED Notes (Signed)
Attempted report 

## 2016-07-12 NOTE — Consult Note (Signed)
Name: April Mueller MRN: [id number] DOB: 22-Nov-1945    ADMISSION DATE:  07/12/2016 CONSULTATION DATE:  12/7  REFERRING MD :  DR Cathleen Fears  CHIEF COMPLAINT:  hypotension  BRIEF PATIENT DESCRIPTION: 70 yr old PA htn, presents fevers, sepsis  STUDIES:  12/7 renal US>>>   HISTORY OF PRESENT ILLNESS: Very Pleasant 70 y.o. female with Past medical history of CKD 3, COPD ( WERT, [place]), chronic respiratory failure, pulmonary hypertension group 2. Also h/o MR with rejected Mitral valve clip procedure by [facility]. To note July visit for unclear anemia, GI bleed work up neg overall. Patient presented with complaints of fever and chills as well as nausea. Patient reported poor appetite for last 2 days. She had chills,no diarhea, no recent abx. She presented ot ED, UA pos, had some bloating lower abdomen. Noted JVF by EDP and pcxr with int prominence after fluid given. Asked ot evaluate for ICU admission and for concerns volume status with sepsis.  PAST MEDICAL HISTORY :   has a past medical history of Anemia (03/23/15); Anxiety; Aortic aneurysm ([place]); Arthritis; CHF (congestive heart failure) ([place]); Chronic lower back pain; CKD (chronic kidney disease) stage 3, GFR 30-59 ml/min; COPD (chronic obstructive pulmonary disease) ([place]); Gout; Heart murmur; History of blood transfusion ("several"); Hypertension; Lupus; On home oxygen therapy; OSA on CPAP; Pulmonary HTN; Rheumatoid arthritis(714.0) (11/06/2012); and Tuberculosis.  has a past surgical history that includes Abdominal aortic aneurysm repair (2005); right heart catheterization (N/A, 08/18/2014); TEE without cardioversion (N/A, 01/05/2015); Wisdom tooth extraction; Givens capsule study (N/A, 08/21/2015); Esophagogastroduodenoscopy (N/A, 08/19/2015); Colonoscopy (N/A, 08/19/2015); Cardiac catheterization (N/A, 10/24/2015); Hemorrhoid surgery; Breast lumpectomy (Right); Cataract extraction w/ intraocular lens  implant, bilateral (Bilateral, 2016); and enteroscopy  (N/A, 02/22/2016). Prior to Admission medications   Medication Sig Start Date End Date Taking? Authorizing Provider  acetaminophen (TYLENOL) 325 MG tablet Take 2 tablets (650 mg total) by mouth every 6 (six) hours as needed for mild pain (or Fever >/= 101). 08/25/15  Yes Albertine Patricia, MD  allopurinol (ZYLOPRIM) 300 MG tablet Take 300 mg by mouth daily.   Yes Historical Provider, MD  ALPRAZolam (XANAX) 0.25 MG tablet Take 0.25 mg by mouth 2 (two) times daily as needed for anxiety or sleep.    Yes Historical Provider, MD  atorvastatin (LIPITOR) 10 MG tablet TAKE 1 TABLET DAILY 03/14/16  Yes Larey Dresser, MD  carboxymethylcellulose (REFRESH PLUS) 0.5 % SOLN Place 1 drop into both eyes at bedtime.   Yes Historical Provider, MD  hydroxychloroquine (PLAQUENIL) 200 MG tablet Take 200 mg by mouth 2 (two) times daily.   Yes Historical Provider, MD  metoprolol succinate (TOPROL-XL) 25 MG 24 hr tablet TAKE 1 TABLET TWICE A DAY 06/05/16  Yes Larey Dresser, MD  neomycin-polymyxin b-dexamethasone (MAXITROL) 3.5-10000-0.1 OINT Place 1 application into both eyes at bedtime.   Yes Historical Provider, MD  Omega-3 Fatty Acids (FISH OIL) 1200 MG CAPS Take 1,200 mg by mouth daily.   Yes Historical Provider, MD  OXYGEN Inhale 3-6 L into the lungs continuous.    Yes Historical Provider, MD  potassium chloride (K-DUR) 10 MEQ tablet Take 4 tabs in AM and 2 tabs in PM Patient taking differently: Take 10 mEq by mouth See admin instructions. Take 4 tabs in AM and 2 tabs in PM 04/11/16  Yes Larey Dresser, MD  torsemide (DEMADEX) 20 MG tablet Take 4 tablets in the AM and 3 tablets in the PM Patient taking differently: Take 20 mg by mouth See  admin instructions. Take 4 tablets in the AM and 3 tablets in the PM 05/17/16  Yes Larey Dresser, MD   No Known Allergies  FAMILY HISTORY:  family history includes Diabetes in her brother; Heart attack in her mother; Kidney failure in her brother; Prostate cancer in her  father. SOCIAL HISTORY:  reports that she quit smoking about 12 years ago. Her smoking use included Cigarettes. She has a 21.00 pack-year smoking history. She has never used smokeless tobacco. She reports that she drinks alcohol. She reports that she does not use drugs.  REVIEW OF SYSTEMS:   Constitutional: POS fever, chills, NO weight loss, POS malaise/fatigue and neg diaphoresis.  HENT: Negative for hearing loss, ear pain, nosebleeds, congestion, sore throat, neck pain, tinnitus and ear discharge.   Eyes: Negative for blurred vision, double vision, photophobia, pain, discharge and redness.  Respiratory: Negative for cough, hemoptysis, sputum production, shortness of breath, wheezing and stridor.   Cardiovascular: Negative for chest pain, palpitations, orthopnea, claudication, mild leg swelling and NO PND.  Gastrointestinal: Negative for heartburn, POS nausea, NEG vomiting, NEG abdominal pain, diarrhea, constipation, blood in stool and melena.  Genitourinary: Negative for dysuria, urgency, frequency, hematuria and flank pain.  Musculoskeletal: Negative for myalgias, back pain, joint pain and falls.  Skin: Negative for itching and rash.  Neurological: Negative for dizziness, tingling, tremors, sensory change, speech change, focal weakness, seizures, loss of consciousness, weakness and headaches. L:Ight headed once at home with fast upright posiiton  Endo/Heme/Allergies: Negative for environmental allergies and polydipsia. Does not bruise/bleed easily.  SUBJECTIVE:   VITAL SIGNS: Temp:  [99.3 F (37.4 C)-102.6 F (39.2 C)] 102.6 F (39.2 C) (12/07 1428) Pulse Rate:  [66-103] 73 (12/07 1730) Resp:  [18-38] 23 (12/07 1730) BP: (82-108)/(51-70) 87/57 (12/07 1730) SpO2:  [96 %-100 %] 98 % (12/07 1730) Weight:  [93 kg (205 lb 0.4 oz)] 93 kg (205 lb 0.4 oz) (12/07 1639)  PHYSICAL EXAMINATION: General:  No distress, speaking full paragraphs Neuro:  Nonfocal, perr HEENT:  jvd noted  elevated Cardiovascular:  s1 s2 rrr ditsant, murmur 4/6 best mid, no radiation Lungs:  CTA Abdomen:  Soft, BS wnl, no r/g, nontender Musculoskeletal:  No edema Skin:  No rash   Recent Labs Lab 07/09/16 1000 07/12/16 1446  NA 144 137  K 4.2 4.5  CL  --  103  CO2 30* 27  BUN 39.0* 46*  CREATININE 2.0* 2.94*  GLUCOSE 118 116*    Recent Labs Lab 07/09/16 1000 07/12/16 1446  HGB 11.4* 11.4*  HCT 37.9 37.4  WBC 5.9 17.5*  PLT 88* 68*   Dg Chest Port 1 View  Result Date: 07/12/2016 CLINICAL DATA:  Sepsis.  Fever, shortness of Breath EXAM: PORTABLE CHEST 1 VIEW COMPARISON:  02/20/2016 FINDINGS: Cardiomegaly with vascular congestion and mild bilateral airspace opacities, left slightly greater than right. I favor this represents asymmetric edema. No visible effusions or acute bony abnormality. IMPRESSION: Suspect mild asymmetric pulmonary edema. Electronically Signed   By: Rolm Baptise M.D.   On: 07/12/2016 15:49    ASSESSMENT / PLAN:  UTI Sepsis CKD, acute on chronic renal failure, ATn, sepsis R/o obstructive uropathy KNOWN moderate PA HTN COPD Diastolic heart dz Cad OSA on cpap Borderline hypotension (only 1 liter in secondary to concerns JVD, pulm edema ) Mild lactic acidosis less 2 and clearing  - she does have JVD and int prominence on pcxr, likely related to her baseline pulm pressure, the remainder of her exam is c/w hypovolemia,  would proceed with more volume and follow clinical status -NO repeat lactic needed -vanc,zosyn started, maintain ABX -get renal US, to ensure no hydro -she is perfusing well at current BP, she also states she runs slight lower diastolic pressors at baseline -would assess pct and repeat -admit to sdu -follow renal fxn closely -get cortisol, if less 20 , add stress hydrocortisone  -I performed a repeat assessment at 5 pm - I updated pt in room extensive -we will follow  Lavon Paganini. Titus Mould, MD, FACP Pgr: [facility] [place] Pager: [phone]  07/12/2016, 5:42 PM

## 2016-07-12 NOTE — Telephone Encounter (Signed)
Left voicemail for patient stating that her labs 3 days ago looked great and she should not need a blood transfusion. Patient should be evaluated by her PCP.

## 2016-07-12 NOTE — ED Provider Notes (Signed)
[facility] DEPT Provider Note   CSN: [id number] Arrival date & time: 07/12/16  1409     History   Chief Complaint Chief Complaint  Patient presents with  . Shortness of Breath  . Fever    HPI April Mueller is a 70 y.o. female with a pmh of Pulmonary hypertension, oxygen dependent on 4 L daily. She presents to the ED from her PCPs office with fever and hypotension. Patient states that she has been feeling very poorly for the past 2 days. She states she has been very cold with shaking chills and lying in bed. She feels short of breath but denies cough, runny nose, urinary symptoms, abdominal pain. She has some myalgias and fatigue. She denies vomiting, or diarrhea. She has a history of chronic rate-controlled atrial fibrillation, but denies being on any anticoagulation. She states that she had too many episodes of bleeding and all anticoagulation including aspirin was discontinued.  HPI  Past Medical History:  Diagnosis Date  . Anemia 03/23/15   transfusion  . Anxiety   . Aortic aneurysm ([place])   . Arthritis    "qwhere" (02/20/2016)  . CHF (congestive heart failure) ([place])   . Chronic lower back pain   . CKD (chronic kidney disease) stage 3, GFR 30-59 ml/min    "on dialysis for 3 months after my aneurysm in 2005" (02/20/2016)  . COPD (chronic obstructive pulmonary disease) ([place])   . Gout   . Heart murmur   . History of blood transfusion "several"  . Hypertension   . Lupus   . On home oxygen therapy    "3L; 24/7" (02/20/2016)  . OSA on CPAP   . Pulmonary HTN   . Rheumatoid arthritis(714.0) 11/06/2012  . Tuberculosis    dormant carrier    Patient Active Problem List   Diagnosis Date Noted  . Iron deficiency anemia due to chronic blood loss 11/03/2015  . Pulmonary hypertension 10/22/2015  . Chronic diastolic CHF (congestive heart failure) ([place]) 09/26/2015  . Acute on chronic diastolic CHF (congestive heart failure) ([place])   . Persistent atrial fibrillation ([place])   .  History of colonic polyps   . Benign neoplasm of cecum   . Benign neoplasm of ascending colon   . Heme positive stool   . Anemia 08/17/2015  . Symptomatic anemia 08/16/2015  . HTN (hypertension) 08/16/2015  . Leukocytosis 08/16/2015  . Acute hyperkalemia 08/16/2015  . 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 Anemia of chronic disease 04/26/2015  . CKD (chronic kidney disease) stage 3, GFR 30-59 ml/min 04/22/2015  . Normocytic anemia 04/04/2015  . Morbid obesity ([place]) 01/22/2015  . Mitral regurgitation 12/29/2014  . Dyspnea 08/18/2014  . Chronic respiratory failure with hypoxia ([place]) 08/09/2014  . Chronic diastolic heart failure ([place]) 04/28/2014  . Secondary pulmonary hypertension 11/06/2012  . Rheumatoid arthritis ([place]) 11/06/2012  . COPD 11/06/2012  . OSA on CPAP 11/06/2012    Past Surgical History:  Procedure Laterality Date  . ABDOMINAL AORTIC ANEURYSM REPAIR  2005  . BREAST LUMPECTOMY Right   . CARDIAC CATHETERIZATION N/A 10/24/2015   Procedure: Right Heart Cath;  Surgeon: Larey Dresser, MD;  Location: [facility];  Service: Cardiovascular;  Laterality: N/A;  . CATARACT EXTRACTION W/ INTRAOCULAR LENS  IMPLANT, BILATERAL Bilateral 2016  . COLONOSCOPY N/A 08/19/2015   Procedure: COLONOSCOPY;  Surgeon: Jerene Bears, MD;  Location[facility] ENDOSCOPY;  Service: Endoscopy;  Laterality: N/A;  . ENTEROSCOPY N/A 02/22/2016   Procedure: ENTEROSCOPY;  Surgeon: Melene Plan  Ardis Hughs, MD;  Location: [facility];  Service: Endoscopy;  Laterality: N/A;  . ESOPHAGOGASTRODUODENOSCOPY N/A 08/19/2015   Procedure: ESOPHAGOGASTRODUODENOSCOPY (EGD);  Surgeon: Jerene Bears, MD;  Location: [facility] ENDOSCOPY;  Service: Endoscopy;  Laterality: N/A;  patient scheduled, anesthesia aware of 1500 case, per [place].  08/18/15 DP  . GIVENS CAPSULE STUDY N/A 08/21/2015   Procedure: GIVENS CAPSULE STUDY;  Surgeon: Gatha Mayer, MD;  Location: [facility];  Service: Endoscopy;  Laterality: N/A;  . HEMORRHOID  SURGERY    . RIGHT HEART CATHETERIZATION N/A 08/18/2014   Procedure: RIGHT HEART CATH;  Surgeon: Jolaine Artist, MD;  Location: [facility] CATH LAB;  Service: Cardiovascular;  Laterality: N/A;  . TEE WITHOUT CARDIOVERSION N/A 01/05/2015   Procedure: TRANSESOPHAGEAL ECHOCARDIOGRAM (TEE);  Surgeon: Larey Dresser, MD;  Location: [facility];  Service: Cardiovascular;  Laterality: N/A;  . WISDOM TOOTH EXTRACTION      OB History    No data available       Home Medications    Prior to Admission medications   Medication Sig Start Date End Date Taking? Authorizing Provider  acetaminophen (TYLENOL) 325 MG tablet Take 2 tablets (650 mg total) by mouth every 6 (six) hours as needed for mild pain (or Fever >/= 101). 08/25/15   Silver Huguenin Elgergawy, MD  allopurinol (ZYLOPRIM) 300 MG tablet Take 300 mg by mouth daily.    Historical Provider, MD  ALPRAZolam Duanne Moron) 0.25 MG tablet Take 0.25 mg by mouth 2 (two) times daily as needed for anxiety or sleep.     Historical Provider, MD  atorvastatin (LIPITOR) 10 MG tablet TAKE 1 TABLET DAILY 03/14/16   Larey Dresser, MD  hydroxychloroquine (PLAQUENIL) 200 MG tablet Take 200 mg by mouth 2 (two) times daily.    Historical Provider, MD  metoprolol succinate (TOPROL-XL) 25 MG 24 hr tablet TAKE 1 TABLET TWICE A DAY 06/05/16   Larey Dresser, MD  Omega-3 Fatty Acids (FISH OIL) 1200 MG CAPS Take 1,200 mg by mouth daily.    Historical Provider, MD  OXYGEN Inhale 3-6 L into the lungs continuous.     Historical Provider, MD  potassium chloride (K-DUR) 10 MEQ tablet Take 4 tabs in AM and 2 tabs in PM 04/11/16   Larey Dresser, MD  torsemide [facility]) 20 MG tablet Take 4 tablets in the AM and 3 tablets in the PM 05/17/16   Larey Dresser, MD    Family History Family History  Problem Relation Age of Onset  . Heart attack Mother   . Prostate cancer Father   . Diabetes Brother   . Kidney failure Brother     Social History Social History  Substance Use Topics  .  Smoking status: Former Smoker    Packs/day: 1.50    Years: 14.00    Types: Cigarettes    Quit date: 08/09/2003  . Smokeless tobacco: Never Used  . Alcohol use 0.0 oz/week     Comment: 02/20/2016 "drank a little in my teens"     Allergies   Patient has no known allergies.   Review of Systems Review of Systems  Ten systems reviewed and are negative for acute change, except as noted in the HPI.   Physical Exam Updated Vital Signs BP 100/68   Pulse 91   Temp 102.6 F (39.2 C) (Rectal)   Resp 26   SpO2 96%   Physical Exam  Constitutional: She is oriented to person, place, and time. She appears well-developed and well-nourished. No  distress.  Alert and oriented. Appears weak.  HENT:  Head: Normocephalic and atraumatic.  Eyes: Conjunctivae and EOM are normal. Pupils are equal, round, and reactive to light. No scleral icterus.  Neck: Normal range of motion.  Cardiovascular: Normal rate and normal heart sounds.  Exam reveals no gallop and no friction rub.   No murmur heard. Irregularly irregular rhythm  Pulmonary/Chest: Effort normal. No respiratory distress. She has wheezes. She has no rales.  Tachypnea  Abdominal: Soft. Bowel sounds are normal. She exhibits no distension and no mass. There is no tenderness. There is no guarding.  Neurological: She is alert and oriented to person, place, and time.  Skin: Skin is warm and dry. Capillary refill takes less than 2 seconds. She is not diaphoretic.  Nursing note and vitals reviewed.    ED Treatments / Results  Labs (all labs ordered are listed, but only abnormal results are displayed) Labs Reviewed  CBC WITH DIFFERENTIAL/PLATELET - Abnormal; Notable for the following:       Result Value   WBC 17.5 (*)    RBC 3.86 (*)    Hemoglobin 11.4 (*)    RDW 16.1 (*)    Neutro Abs 15.6 (*)    Lymphs Abs 0.6 (*)    Monocytes Absolute 1.3 (*)    All other components within normal limits  I-STAT CG4 LACTIC ACID, ED - Abnormal; Notable  for the following:    Lactic Acid, Venous 1.96 (*)    All other components within normal limits  CULTURE, BLOOD (ROUTINE X 2)  CULTURE, BLOOD (ROUTINE X 2)  URINE CULTURE  COMPREHENSIVE METABOLIC PANEL  URINALYSIS, ROUTINE W REFLEX MICROSCOPIC  LIPASE, BLOOD  BRAIN NATRIURETIC PEPTIDE  INFLUENZA PANEL BY PCR (TYPE A & B, H1N1)  I-STAT TROPOININ, ED    EKG  EKG Interpretation None       Radiology No results found.  Procedures Procedures (including critical care time)  Medications Ordered in ED Medications - No data to display   Initial Impression / Assessment and Plan / ED Course  I have reviewed the triage vital signs and the nursing notes.  Pertinent labs & imaging results that were available during my care of the patient were reviewed by me and considered in my medical decision making (see chart for details).  Clinical Course as of Jul 12 1704  Thu Jul 12, 2016  1516 Patient with elevated lactate at 1.96. It is only a mild elevation. I started the sepsis protocol given her hypotension and fever with tachypnea. Her heart rate is normal. However, she is in A. fib. She states she is chronically in A. fib. Her respirations are rapid at 38. Oxygen saturations are normal on 4 L. I have ordered by mouth Tylenol to help with her fever. She is given vancomycin and Zosyn for unknown source of sepsis. Monitored closely  [AH]  1621 Sepsis - Repeat Assessment  Performed at:    4:20pm  Vitals  74 88/61 (71) 27 100 %       Heart:     Irregular rate and rhythm  Lungs:    Rales in the bases  Capillary Refill:   > 2 sec  Peripheral Pulse:   Radial pulse palpable  Skin:     Normal Color   [AH]    Clinical Course User Index [AH] Margarita Mail, PA-C      Final Clinical Impressions(s) / ED Diagnoses   Final diagnoses:  None    Patient with What appears  to be urosepsis. She is seen in shared visit with Dr. Winfred Leeds and Dr. Titus Mould has seen the patient in the  emergency department. She has significant pulmonary hypertension, chronic oxygen dependence. Given her pulmonary hypertension. The [facility] physician's likely got a baseline pulmonary edema and shortness of breath. Patient does not feel more short of breath when lying flat, although she does have some bilateral fluffy infiltrates. We will give her full fluid bolus. She states that she normally has hypotension.  Her map is greater than 65. Patient will be admitted to [facility] Prescriptions   No medications on file     Margarita Mail, PA-C 07/12/16 [place], MD 07/12/16 1740

## 2016-07-12 NOTE — ED Triage Notes (Signed)
Per [facility], pt from PCP, went there for fever and feeling SOB. Spo2 90% on RA. Pt on 3L o2 at home, hx of COPD. Last temp was 102.6. Pt took tylenol at 10am before she qwent to PCP. Unknown how much. Pt is AAOX4. Afib on monitor, hx of Afib. BP 96/44. Given 250 cc by ems. Lung sounds clear.

## 2016-07-12 NOTE — Telephone Encounter (Signed)
Pt called and left message stating she is sick for past 2 days with increased fatigue.  Stated she did not get Aranesp injection due to her Hgb was high on 07/09/16.   Wanted to know if pt should come in to have transfusion.  Pt would like to know what Dr. Irene Limbo would suggest. Attempted to call pt back unsuccessfully.   Left message on voice mail requesting a call back from pt for further information. Pt's    Phone    [phone].

## 2016-07-12 NOTE — ED Provider Notes (Deleted)
Complains of generalized weakness for the past 2 days noted to be febrile at her doctor's office earlier today with temperature 102.6. Sent here for further evaluationshe denies. She denies shortness of breath denies cough or denies pain anywhere on exam moderately ill-appearing, noted to be tachypneic. HEENT exam mucous membranes dry neck supple positive JVD lungs clear to auscultation heart regular rate and rhythm abdomen obese nontender extremities without edema skin warm and dry. Code sepsis called based on Sirs criteria fever and tachypnea potential source of infection at this point unknown   Orlie Dakin, MD 07/12/16 1738

## 2016-07-12 NOTE — ED Provider Notes (Signed)
[place] of generalized weakness for the proximal to the past 2 days. Noted to be febrile today at her doctor's office. With temperature 102.6. Sent here for further evaluation. She denies shortness of breath denies cough denies pain anywhere. On exam moderately ill-appearing H ENT exam extremities dry neck supple lungs clear auscultation heart regular rate and rhythm abdomen obese nontender extremities without edema skin warm dry. Code sepsis called based on Sirs criteria fever and tachypnea. Potential source of infection at this point unknown   Orlie Dakin, MD 07/12/16 1736

## 2016-07-13 ENCOUNTER — Other Ambulatory Visit: Payer: Self-pay

## 2016-07-13 ENCOUNTER — Inpatient Hospital Stay (HOSPITAL_COMMUNITY): Payer: Medicare Other

## 2016-07-13 DIAGNOSIS — Z9989 Dependence on other enabling machines and devices: Secondary | ICD-10-CM

## 2016-07-13 DIAGNOSIS — A4151 Sepsis due to Escherichia coli [E. coli]: Principal | ICD-10-CM

## 2016-07-13 DIAGNOSIS — J9611 Chronic respiratory failure with hypoxia: Secondary | ICD-10-CM

## 2016-07-13 DIAGNOSIS — I2721 Secondary pulmonary arterial hypertension: Secondary | ICD-10-CM

## 2016-07-13 DIAGNOSIS — G4733 Obstructive sleep apnea (adult) (pediatric): Secondary | ICD-10-CM

## 2016-07-13 DIAGNOSIS — I509 Heart failure, unspecified: Secondary | ICD-10-CM

## 2016-07-13 DIAGNOSIS — I5033 Acute on chronic diastolic (congestive) heart failure: Secondary | ICD-10-CM

## 2016-07-13 DIAGNOSIS — I5032 Chronic diastolic (congestive) heart failure: Secondary | ICD-10-CM

## 2016-07-13 DIAGNOSIS — J9621 Acute and chronic respiratory failure with hypoxia: Secondary | ICD-10-CM

## 2016-07-13 LAB — BLOOD GAS, ARTERIAL
Acid-base deficit: 2.7 mmol/L — ABNORMAL HIGH (ref 0.0–2.0)
Bicarbonate: 22.6 mmol/L (ref 20.0–28.0)
Drawn by: 44898
O2 Content: 4 L/min
O2 Saturation: 90.8 %
PCO2 ART: 47.4 mmHg (ref 32.0–48.0)
PH ART: 7.303 — AB (ref 7.350–7.450)
Patient temperature: 99.6
pO2, Arterial: 70.1 mmHg — ABNORMAL LOW (ref 83.0–108.0)

## 2016-07-13 LAB — CBC WITH DIFFERENTIAL/PLATELET
Basophils Absolute: 0 10*3/uL (ref 0.0–0.1)
Basophils Relative: 0 %
EOS ABS: 0 10*3/uL (ref 0.0–0.7)
EOS PCT: 0 %
HCT: 35.7 % — ABNORMAL LOW (ref 36.0–46.0)
Hemoglobin: 10.8 g/dL — ABNORMAL LOW (ref 12.0–15.0)
LYMPHS ABS: 0.6 10*3/uL — AB (ref 0.7–4.0)
LYMPHS PCT: 3 %
MCH: 29.7 pg (ref 26.0–34.0)
MCHC: 30.3 g/dL (ref 30.0–36.0)
MCV: 98.1 fL (ref 78.0–100.0)
MONO ABS: 1.1 10*3/uL — AB (ref 0.1–1.0)
Monocytes Relative: 5 %
Neutro Abs: 18.6 10*3/uL — ABNORMAL HIGH (ref 1.7–7.7)
Neutrophils Relative %: 92 %
PLATELETS: 73 10*3/uL — AB (ref 150–400)
RBC: 3.64 MIL/uL — AB (ref 3.87–5.11)
RDW: 16.7 % — AB (ref 11.5–15.5)
WBC: 20.2 10*3/uL — AB (ref 4.0–10.5)

## 2016-07-13 LAB — BLOOD CULTURE ID PANEL (REFLEXED)
Acinetobacter baumannii: NOT DETECTED
CANDIDA GLABRATA: NOT DETECTED
CANDIDA KRUSEI: NOT DETECTED
Candida albicans: NOT DETECTED
Candida parapsilosis: NOT DETECTED
Candida tropicalis: NOT DETECTED
Carbapenem resistance: NOT DETECTED
ENTEROBACTER CLOACAE COMPLEX: NOT DETECTED
ENTEROBACTERIACEAE SPECIES: DETECTED — AB
ENTEROCOCCUS SPECIES: NOT DETECTED
ESCHERICHIA COLI: DETECTED — AB
Haemophilus influenzae: NOT DETECTED
KLEBSIELLA OXYTOCA: NOT DETECTED
Klebsiella pneumoniae: NOT DETECTED
LISTERIA MONOCYTOGENES: NOT DETECTED
NEISSERIA MENINGITIDIS: NOT DETECTED
PSEUDOMONAS AERUGINOSA: NOT DETECTED
Proteus species: NOT DETECTED
SERRATIA MARCESCENS: NOT DETECTED
STAPHYLOCOCCUS SPECIES: NOT DETECTED
STREPTOCOCCUS AGALACTIAE: NOT DETECTED
STREPTOCOCCUS PNEUMONIAE: NOT DETECTED
STREPTOCOCCUS PYOGENES: NOT DETECTED
Staphylococcus aureus (BCID): NOT DETECTED
Streptococcus species: NOT DETECTED

## 2016-07-13 LAB — COMPREHENSIVE METABOLIC PANEL
ALT: 15 U/L (ref 14–54)
ANION GAP: 9 (ref 5–15)
AST: 18 U/L (ref 15–41)
Albumin: 2.7 g/dL — ABNORMAL LOW (ref 3.5–5.0)
Alkaline Phosphatase: 88 U/L (ref 38–126)
BUN: 48 mg/dL — ABNORMAL HIGH (ref 6–20)
CHLORIDE: 104 mmol/L (ref 101–111)
CO2: 24 mmol/L (ref 22–32)
CREATININE: 2.99 mg/dL — AB (ref 0.44–1.00)
Calcium: 10.1 mg/dL (ref 8.9–10.3)
GFR, EST AFRICAN AMERICAN: 17 mL/min — AB (ref >60)
GFR, EST NON AFRICAN AMERICAN: 15 mL/min — AB (ref >60)
Glucose, Bld: 96 mg/dL (ref 65–99)
POTASSIUM: 4.6 mmol/L (ref 3.5–5.1)
SODIUM: 137 mmol/L (ref 135–145)
Total Bilirubin: 1.1 mg/dL (ref 0.3–1.2)
Total Protein: 7.1 g/dL (ref 6.5–8.1)

## 2016-07-13 LAB — BASIC METABOLIC PANEL
ANION GAP: 13 (ref 5–15)
BUN: 46 mg/dL — ABNORMAL HIGH (ref 6–20)
CALCIUM: 10.2 mg/dL (ref 8.9–10.3)
CHLORIDE: 102 mmol/L (ref 101–111)
CO2: 20 mmol/L — AB (ref 22–32)
Creatinine, Ser: 2.89 mg/dL — ABNORMAL HIGH (ref 0.44–1.00)
GFR calc non Af Amer: 16 mL/min — ABNORMAL LOW (ref >60)
GFR, EST AFRICAN AMERICAN: 18 mL/min — AB (ref >60)
Glucose, Bld: 85 mg/dL (ref 65–99)
POTASSIUM: 4.7 mmol/L (ref 3.5–5.1)
Sodium: 135 mmol/L (ref 135–145)

## 2016-07-13 LAB — EXPECTORATED SPUTUM ASSESSMENT W REFEX TO RESP CULTURE

## 2016-07-13 LAB — TROPONIN I
TROPONIN I: 0.05 ng/mL — AB (ref <0.03)
Troponin I: 0.05 ng/mL (ref <0.03)
Troponin I: 0.06 ng/mL (ref <0.03)

## 2016-07-13 LAB — LIPASE, BLOOD: LIPASE: 14 U/L (ref 11–51)

## 2016-07-13 LAB — LACTIC ACID, PLASMA: Lactic Acid, Venous: 1.7 mmol/L (ref 0.5–1.9)

## 2016-07-13 LAB — ECHOCARDIOGRAM COMPLETE
HEIGHTINCHES: 64 in
Weight: 3622.4 oz

## 2016-07-13 LAB — MAGNESIUM: MAGNESIUM: 1.9 mg/dL (ref 1.7–2.4)

## 2016-07-13 LAB — EXPECTORATED SPUTUM ASSESSMENT W GRAM STAIN, RFLX TO RESP C

## 2016-07-13 MED ORDER — METOLAZONE 10 MG PO TABS
5.0000 mg | ORAL_TABLET | Freq: Once | ORAL | Status: AC
  Administered 2016-07-13: 5 mg via ORAL
  Filled 2016-07-13: qty 1

## 2016-07-13 MED ORDER — DEXTROSE 5 % IV SOLN
2.0000 g | INTRAVENOUS | Status: DC
  Administered 2016-07-13: 2 g via INTRAVENOUS
  Filled 2016-07-13: qty 2

## 2016-07-13 MED ORDER — DEXTROSE 5 % IV SOLN
2.0000 g | INTRAVENOUS | Status: DC
  Administered 2016-07-14 – 2016-07-19 (×6): 2 g via INTRAVENOUS
  Filled 2016-07-13 (×6): qty 2

## 2016-07-13 MED ORDER — FUROSEMIDE 10 MG/ML IJ SOLN
60.0000 mg | Freq: Once | INTRAMUSCULAR | Status: AC
  Administered 2016-07-13: 60 mg via INTRAVENOUS
  Filled 2016-07-13: qty 6

## 2016-07-13 MED ORDER — FUROSEMIDE 10 MG/ML IJ SOLN
80.0000 mg | Freq: Two times a day (BID) | INTRAMUSCULAR | Status: DC
  Administered 2016-07-14 – 2016-07-17 (×7): 80 mg via INTRAVENOUS
  Filled 2016-07-13 (×7): qty 8

## 2016-07-13 MED ORDER — IOPAMIDOL (ISOVUE-300) INJECTION 61%
15.0000 mL | INTRAVENOUS | Status: AC
  Administered 2016-07-13 (×2): 15 mL via ORAL

## 2016-07-13 MED ORDER — SODIUM CHLORIDE 0.9 % IV BOLUS (SEPSIS)
500.0000 mL | Freq: Once | INTRAVENOUS | Status: AC
  Administered 2016-07-13: 500 mL via INTRAVENOUS

## 2016-07-13 NOTE — Progress Notes (Signed)
Have just checked on patient.  Patient is resting with eyes closed.  Upon waking, patient denies any further pain/chest pressure.

## 2016-07-13 NOTE — Progress Notes (Signed)
Responded to consult, discussed/completed advanced directive w and for/ pt (who will have to be propped up to write to sigh form). Will be back when volunteers/notary available to execute form. Provided spiritual/emotional support and prayer -- which pt really appreciated. Chaplain available for follow-up.   07/13/16 1000  Clinical Encounter Type  Visited With Patient  Visit Type Initial;Spiritual support;Other (Comment) (advanced directive)  Referral From Nurse  Spiritual Encounters  Spiritual Needs Brochure;Prayer  Stress Factors  Patient Stress Factors Health changes;Loss of control   Gerrit Heck, Chaplain

## 2016-07-13 NOTE — Consult Note (Addendum)
Advanced Heart Failure Team Consult Note  Referring Physician: Dr. Allyson Sabal Primary Physician: Dr. Harrington Challenger Pulmonary: Dr Byrum/Dr. Melvyn Novas Primary Cardiologist: Dr. Aundra Dubin  Reason for Consultation: CHF  HPI:    April Mueller is a 70 y.o. female with history of Chronic Diastolic CHF LVEF 16-10% (Moderate TR and Cor Pulmonale), COPD, CKD stage III, Chronic respiratory failure, and PAH.   Last seen in HF clinic 05/16/16. Was thought to be relatively stable. Had torsemide increased at the previous visit and seemed to have improved. Pt weighted 207 lbs at that visit.  Pt presented to [facility] 07/12/16 with fevers, chills, nausea and no appetite. Also c/o SOB and dizziness/lightheadedness.   Pt met SIRS criteria with fever of unknown source. Started on vanc/zosyn. BC+ positive for gram negative rods.  UA with many bacteria. Pertinent labs on admission included Creatinine 2.94, K 4.5, BNP 318.6, Troponin 0.05. WBC 17.5. Procalcitonin 40.47. CXR with mild asymetric pulmonary edema.   Tmax 102.6.  Pt continues to feel very ill. Complaining of chills and dyspnea with any exertion.  Very fatigued. Started feeling bad "Tuesday night".    ABX narrowed to Ceftriaxone.   Pt positive 2.5 L so far from admission.  Weight is up nearly 20 lbs since we last saw in October.   BCx + for E-coli bacteremia.   Review of Systems: [y] = yes, _0  = no   General: Weight gain [y]; Weight loss _1 ; Anorexia _2 ; Fatigue [y]; Fever [y]; Chills [y]; Weakness [y]  Cardiac: Chest pain/pressure _3 ; Resting SOB [y]; Exertional SOB [y]; Orthopnea [y]; Pedal Edema _4 ; Palpitations _5 ; Syncope _6 ; Presyncope _7 ; Paroxysmal nocturnal dyspnea_8   Pulmonary: Cough _9 ; Wheezing_10 ; Hemoptysis_11 ; Sputum _12 ; Snoring _13   GI: Vomiting_14 ; Dysphagia_15 ; Melena_16 ; Hematochezia _17 ; Heartburn_18 ; Abdominal pain _19 ; Constipation _20 ; Diarrhea _21 ; BRBPR _22   GU: Hematuria_23 ; Dysuria _24 ; Nocturia_25   Vascular: Pain in legs with  walking _26 ; Pain in feet with lying flat _27 ; Non-healing sores _28 ; Stroke _29 ; TIA _30 ; Slurred speech _31 ;  Neuro: Headaches_32 ; Vertigo_33 ; Seizures_34 ; Paresthesias_35 ;Blurred vision _36 ; Diplopia _37 ; Vision changes _38   Ortho/Skin: Arthritis _39 ; Joint pain _40 ; Muscle pain _41 ; Joint swelling _42 ; Back Pain _43 ; Rash _44   Psych: Depression_45 ; Anxiety_46   Heme: Bleeding problems _47 ; Clotting disorders _48 ; Anemia _49   Endocrine: Diabetes _50 ; Thyroid dysfunction_51   Home Medications Prior to Admission medications   Medication Sig Start Date End Date Taking? Authorizing Provider  acetaminophen (TYLENOL) 325 MG tablet Take 2 tablets (650 mg total) by mouth every 6 (six) hours as needed for mild pain (or Fever >/= 101). 08/25/15  Yes Albertine Patricia, MD  allopurinol (ZYLOPRIM) 300 MG tablet Take 300 mg by mouth daily.   Yes Historical Provider, MD  ALPRAZolam (XANAX) 0.25 MG tablet Take 0.25 mg by mouth 2 (two) times daily as needed for anxiety or sleep.    Yes Historical Provider, MD  atorvastatin (LIPITOR) 10 MG tablet TAKE 1 TABLET DAILY 03/14/16  Yes Larey Dresser, MD  carboxymethylcellulose (REFRESH PLUS) 0.5 % SOLN Place 1 drop into both eyes at bedtime.   Yes Historical Provider, MD  hydroxychloroquine (PLAQUENIL) 200 MG tablet Take 200 mg by mouth 2 (two) times daily.   Yes Historical  Provider, MD  metoprolol succinate (TOPROL-XL) 25 MG 24 hr tablet TAKE 1 TABLET TWICE A DAY 06/05/16  Yes Larey Dresser, MD  neomycin-polymyxin b-dexamethasone (MAXITROL) 3.5-10000-0.1 OINT Place 1 application into both eyes at bedtime.   Yes Historical Provider, MD  Omega-3 Fatty Acids (FISH OIL) 1200 MG CAPS Take 1,200 mg by mouth daily.   Yes Historical Provider, MD  OXYGEN Inhale 3-6 L into the lungs continuous.    Yes Historical Provider, MD  potassium chloride (K-DUR) 10 MEQ tablet Take 4 tabs in AM and 2 tabs in PM Patient taking differently: Take 10 mEq by mouth See admin instructions.  Take 4 tabs in AM and 2 tabs in PM 04/11/16  Yes Larey Dresser, MD  torsemide (DEMADEX) 20 MG tablet Take 4 tablets in the AM and 3 tablets in the PM Patient taking differently: Take 20 mg by mouth See admin instructions. Take 4 tablets in the AM and 3 tablets in the PM 05/17/16  Yes Larey Dresser, MD    Past Medical History: Past Medical History:  Diagnosis Date  . Anemia 03/23/15   transfusion  . Anxiety   . Aortic aneurysm ([place])   . Arthritis    "qwhere" (02/20/2016)  . CHF (congestive heart failure) ([place])   . Chronic lower back pain   . CKD (chronic kidney disease) stage 3, GFR 30-59 ml/min    "on dialysis for 3 months after my aneurysm in 2005" (02/20/2016)  . COPD (chronic obstructive pulmonary disease) ([place])   . Gout   . Heart murmur   . History of blood transfusion "several"  . Hypertension   . Lupus   . On home oxygen therapy    "3L; 24/7" (02/20/2016)  . OSA on CPAP   . Pulmonary HTN   . Rheumatoid arthritis(714.0) 11/06/2012  . Tuberculosis    dormant carrier    Past Surgical History: Past Surgical History:  Procedure Laterality Date  . ABDOMINAL AORTIC ANEURYSM REPAIR  2005  . BREAST LUMPECTOMY Right   . CARDIAC CATHETERIZATION N/A 10/24/2015   Procedure: Right Heart Cath;  Surgeon: Larey Dresser, MD;  Location: [place] CV LAB;  Service: Cardiovascular;  Laterality: N/A;  . CATARACT EXTRACTION W/ INTRAOCULAR LENS  IMPLANT, BILATERAL Bilateral 2016  . COLONOSCOPY N/A 08/19/2015   Procedure: COLONOSCOPY;  Surgeon: Jerene Bears, MD;  Location[facility] ENDOSCOPY;  Service: Endoscopy;  Laterality: N/A;  . ENTEROSCOPY N/A 02/22/2016   Procedure: ENTEROSCOPY;  Surgeon: Milus Banister, MD;  Location: [place];  Service: Endoscopy;  Laterality: N/A;  . ESOPHAGOGASTRODUODENOSCOPY N/A 08/19/2015   Procedure: ESOPHAGOGASTRODUODENOSCOPY (EGD);  Surgeon: Jerene Bears, MD;  Location: [facility] ENDOSCOPY;  Service: Endoscopy;  Laterality: N/A;  patient scheduled, anesthesia aware of  1500 case, per [place].  08/18/15 DP  . GIVENS CAPSULE STUDY N/A 08/21/2015   Procedure: GIVENS CAPSULE STUDY;  Surgeon: Gatha Mayer, MD;  Location: [place];  Service: Endoscopy;  Laterality: N/A;  . HEMORRHOID SURGERY    . RIGHT HEART CATHETERIZATION N/A 08/18/2014   Procedure: RIGHT HEART CATH;  Surgeon: Jolaine Artist, MD;  Location: [facility] CATH LAB;  Service: Cardiovascular;  Laterality: N/A;  . TEE WITHOUT CARDIOVERSION N/A 01/05/2015   Procedure: TRANSESOPHAGEAL ECHOCARDIOGRAM (TEE);  Surgeon: Larey Dresser, MD;  Location: [facility] ENDOSCOPY;  Service: Cardiovascular;  Laterality: N/A;  . WISDOM TOOTH EXTRACTION      Family History: Family History  Problem Relation Age of Onset  . Heart attack Mother   .  Prostate cancer Father   . Diabetes Brother   . Kidney failure Brother     Social History: Social History   Social History  . Marital status: Legally Separated    Spouse name: N/A  . Number of children: 1  . Years of education: N/A   Occupational History  . Disability     Office Work   Social History Main Topics  . Smoking status: Former Smoker    Packs/day: 1.50    Years: 14.00    Types: Cigarettes    Quit date: 08/09/2003  . Smokeless tobacco: Never Used  . Alcohol use 0.0 oz/week     Comment: 02/20/2016 "drank a little in my teens"  . Drug use: No  . Sexual activity: No   Other Topics Concern  . None   Social History Narrative  . None    Allergies:  No Known Allergies  Objective:    Vital Signs:   Temp:  [98.1 F (36.7 C)-102.6 F (39.2 C)] 99.6 F (37.6 C) (12/08 1337) Pulse Rate:  [63-103] 101 (12/08 1337) Resp:  [18-38] 28 (12/08 1337) BP: (80-124)/(43-79) 105/71 (12/08 1337) SpO2:  [91 %-100 %] 92 % (12/08 1337) Weight:  [205 lb 0.4 oz (93 kg)-226 lb 6.4 oz (102.7 kg)] 226 lb 6.4 oz (102.7 kg) (12/08 0400) Last BM Date: 07/13/16  Weight change: Filed Weights   07/12/16 1639 07/12/16 1856 07/13/16 0400  Weight: 205 lb 0.4 oz (93 kg) 222  lb 1.6 oz (100.7 kg) 226 lb 6.4 oz (102.7 kg)    Intake/Output:   Intake/Output Summary (Last 24 hours) at 07/13/16 1349 Last data filed at 07/13/16 0821  Gross per 24 hour  Intake          2646.25 ml  Output              100 ml  Net          2546.25 ml     Physical Exam: General:  Weak. Ill appearing.  HEENT: Normal Neck: supple. JVP elevated. Carotids 2+ bilat; no bruits. No lymphadenopathy or thyromegaly appreciated. Cor: PMI nondisplaced. Irregular. Slightly tachy. 3/6 MR.  Lungs: Diminished throughout. Mild basilar crackles.  Abdomen: soft, NT, ND, no HSM. No bruits or masses. +BS  Extremities: no cyanosis, clubbing, rash. 1-2+ edema.  Neuro: alert & orientedx3, cranial nerves grossly intact. moves all 4 extremities w/o difficulty. Affect flat.  Telemetry: Reviewed personally, Afib 90-100s  Labs: Basic Metabolic Panel:  Recent Labs Lab 07/09/16 1000 07/12/16 1446 07/13/16 0446  NA 144 137 137  K 4.2 4.5 4.6  CL  --  103 104  CO2 30* 27 24  GLUCOSE 118 116* 96  BUN 39.0* 46* 48*  CREATININE 2.0* 2.94* 2.99*  CALCIUM 10.8* 10.1 10.1  MG  --   --  1.9    Liver Function Tests:  Recent Labs Lab 07/09/16 1000 07/12/16 1446 07/13/16 0446  AST _0 ALT _1 ALKPHOS 127 107 88  BILITOT 0.59 0.9 1.1  PROT 7.6 7.3 7.1  ALBUMIN 3.2* 3.1* 2.7*    Recent Labs Lab 07/12/16 1446  LIPASE 15   No results for input(s): AMMONIA in the last 168 hours.  CBC:  Recent Labs Lab 07/09/16 1000 07/12/16 1446 07/12/16 1933 07/13/16 0446  WBC 5.9 17.5* 21.3* 20.2*  NEUTROABS 4.6 15.6* 19.5* 18.6*  HGB 11.4* 11.4* 10.4* 10.8*  HCT 37.9 37.4 33.9* 35.7*  MCV 98.4 96.9 98.0 98.1  PLT 88*  68* 75* 73*    Cardiac Enzymes:  Recent Labs Lab 07/13/16 0646 07/13/16 1204  TROPONINI 0.05* 0.05*    BNP: BNP (last 3 results)  Recent Labs  04/02/16 0909 04/11/16 0922 07/12/16 1447  BNP 317.2* 180.4* 318.6*    ProBNP (last 3 results) No results  for input(s): PROBNP in the last 8760 hours.   CBG: No results for input(s): GLUCAP in the last 168 hours.  Coagulation Studies:  Recent Labs  07/12/16 1933  LABPROT 17.4*  INR 1.41    Other results: EKG: Afib 93  Imaging: Ct Abdomen Pelvis Wo Contrast  Result Date: 07/13/2016 CLINICAL DATA:  Sepsis, diarrhea EXAM: CT ABDOMEN AND PELVIS WITHOUT CONTRAST TECHNIQUE: Multidetector CT imaging of the abdomen and pelvis was performed following the standard protocol without IV contrast. COMPARISON:  CT chest 05/06/2013 FINDINGS: Lower chest: There is dilatation of descending aorta up to 4.1 cm stable from prior exam. There is small right pleural effusion with right base posterior atelectasis. Mild bronchitic changes are noted bilateral lower lobe. Cardiomegaly is noted. Hepatobiliary: Study is limited without IV contrast. Unenhanced liver shows no biliary ductal dilatation. Scattered hepatic cysts are again noted with progression in size from prior exam. The largest cyst in anterior aspect of the right hepatic dome measures 4 cm. Largest cyst in lateral segment of left hepatic lobe measures 3.4 cm. No calcified gallstones are noted within gallbladder. There is micro nodular liver contour. Chronic liver disease or cirrhosis cannot be excluded. Pancreas: No focal abnormality is noted in unenhanced pancreas. Axial image 33 there is mild stranding of the fat surrounding the pancreatic head region. There is mild stranding of the fat inferior to pancreatic head region and just above the distal duodenum please see coronal image 66. Mild focal pancreatitis or duodenal inflammation cannot be excluded. Clinical correlation is necessary. Spleen: Unenhanced spleen is normal. Adrenals/Urinary Tract: Again noted mild thickening of left adrenal gland. The right adrenal gland is stable. There is a cyst in midpole posterior aspect of the left kidney measures 4.1 cm. There is cortical scarring midpole of the right  kidney. A cyst in lower pole of the right kidney measures 4.4 cm. No hydronephrosis or hydroureter. No nephrolithiasis. No calcified calculi are noted within under distended urinary bladder. There is nonobstructive calcified calculus in lower pole of the left kidney measures 4 mm. Stomach/Bowel: Oral contrast material was given to the patient. There is no evidence of small bowel obstruction. No thickened or dilated small bowel loops are noted. There is no pericecal inflammation. Normal appendix is partially visualized in axial image 51. Some colonic gas noted in transverse colon. Multiple descending colon diverticula. The descending colon and sigmoid colon are empty collapsed. Multiple colonic diverticula are noted sigmoid colon. There is redundant sigmoid colon. No evidence of acute pancreatitis. No distal colitis. Some liquid stool noted within rectum. Diarrhea cannot be excluded. Vascular/Lymphatic: No aortic aneurysm. Atherosclerotic calcifications of abdominal aorta and iliac arteries. Reproductive: The uterus is atrophic. There is a cyst/follicle within right ovary measures 2 cm. Other: No free abdominal air.  No large abdominal ascites. Musculoskeletal: Sagittal images of the spine shows degenerative changes thoracolumbar spine. IMPRESSION: 1. Cardiomegaly is noted. Again noted aneurysmal dilatation of descending aorta stable from prior exam. Small right pleural effusion with right base posterior medially atelectasis. 2. Scattered hepatic cysts are noted progression in size from prior exam. 3. Bilateral renal cysts.  Left nonobstructive nephrolithiasis. 4. Micro nodular liver contour. Chronic liver disease or cirrhosis cannot  be excluded. 5. There is subtle mild stranding of the fat surrounding the pancreatic head region in medial and inferior to duodenum. Mild focal pancreatitis or duodenal inflammation cannot be excluded. Clinical correlation is necessary. Study is limited without IV contrast. 6. No small  bowel or colonic obstruction. No pericecal inflammation. Normal appendix. 7. Distal colonic diverticula. No evidence of acute colitis or diverticulitis. 8. There is a dominant follicle/cyst within right ovary measures 2 cm. Electronically Signed   By: Lahoma Crocker M.D.   On: 07/13/2016 13:43   Dg Chest 2 View  Result Date: 07/13/2016 CLINICAL DATA:  Fever.  Sepsis.  Shortness of breath. EXAM: CHEST  2 VIEW COMPARISON:  07/12/2016 and 02/20/2016 and CT scan of the chest dated 05/06/2013 FINDINGS: Chronic cardiomegaly. Tortuosity and chronic aneurysmal dilatation of the thoracic aorta. No acute infiltrates or effusions. Slight linear scarring in both lungs, stable. No acute bone abnormality. IMPRESSION: No acute abnormalities. Chronic cardiomegaly. 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 Electronically Signed   By: Lorriane Shire M.D.   On: 07/13/2016 11:29   US Renal  Result Date: 07/13/2016 CLINICAL DATA:  Sepsis. EXAM: RENAL / URINARY TRACT ULTRASOUND COMPLETE COMPARISON:  None. FINDINGS: Right Kidney: Length: 11.0 cm. Thinning of the renal parenchyma with increased echogenicity. Capsular contours are lobular. No hydronephrosis. There is a 4.3 x 3.9 x 3.7 cm cyst from the lower kidney. Left Kidney: Length: 11.1 cm. There is increased renal echogenicity and mild lobular contours. No hydronephrosis. Two lower pole cysts in measuring 3.6 x 3.5 x 4.6 cm, with an adjacent 1.7 cm cyst. Bladder: Appears normal for degree of bladder distention. IMPRESSION: Findings consistent with chronic medical renal disease. No hydronephrosis. Bilateral renal cysts. Electronically Signed   By: Jeb Levering M.D.   On: 07/13/2016 03:23   Dg Chest Port 1 View  Result Date: 07/12/2016 CLINICAL DATA:  Sepsis.  Fever, shortness of Breath EXAM: PORTABLE CHEST 1 VIEW COMPARISON:  02/20/2016 FINDINGS: Cardiomegaly with vascular congestion and mild bilateral airspace opacities, left slightly greater than  right. I favor this represents asymmetric edema. No visible effusions or acute bony abnormality. IMPRESSION: Suspect mild asymmetric pulmonary edema. Electronically Signed   By: Rolm Baptise M.D.   On: 07/12/2016 15:49      Medications:     Current Medications: . atorvastatin  10 mg Oral Daily  . cefTRIAXone (ROCEPHIN)  IV  2 g Intravenous Q24H  . neomycin-polymyxin b-dexamethasone  1 application Both Eyes QHS  . omega-3 acid ethyl esters  1 g Oral Daily  . polyvinyl alcohol  1 drop Both Eyes QHS  . sodium chloride flush  3 mL Intravenous Q12H     Infusions: . sodium chloride 100 mL/hr at 07/13/16 0821      Assessment/Plan   1. Sepsis 2/2 E-Coli Bacteremia due to UTI - Continue Ceftriaxone per primary.  Got Vanc/Zosyn on adission.  - UCx and BCx both positive for E-Coli - Tmax 102.6. No further fever since yesterday once started on ABX.  - Renal US w/o nephrolithiasis - CT ABD/Pelvis with scattered hepatic cysts (increased in size from prior exam), bilateral renal cysts, ?inflamatory changes around head of pancreas, Normal appendix, no acute colitis or diverticulitis.  - CCM following. 2. Acute on Chronic Diastolic CHF - Has been getting generous IV fluids. Given 3 L of fluid on arrival, and remains on NS at 100 ml/hr. Will drop back to 10 mg/hr for now.  Should stop soon as patient is now  markedly volume overloaded on exam.  - CXR with at least mild pulmonary edema.  - Give IV lasix 60 mg now and follow progress with soft pressures. Suspect will need at least 80 mg IV BID for several days with marked overload.  - Repeat Echo pending.  3. Acute on chronic respiratory failure - Per primary.  SOB well above baseline currently with sepsis and volume overload.  - On chronic 02 4-5L at home.   - Likely component of OHS as well.  - CCM following as above.  4. ARF on CKD stage III - Will need to follow closeley with diuresis. She is likely congested currently.  5. MR - Mild to  Moderate MR by Echo 1/17 - Repeat Echo pending.  6. Atrial Fib, Persistent - Rate up currently with sepsis.  - Not AC candidate with recurrent severe GI bleeding. No stroke like symptoms.  7. PAH - primarily [place] group 2 based on RHC 3/17 - Likely contributor to her dyspnea, but poor candidate for pulmonary vasodilators.  Pt is critically ill with E-coli bacteremia.  She has received > 3 L of fluid and is up 20 lbs from her previous baseline.  Will greatly decrease IVF with plans on stopping soon.  Need to diurese as pressure tolerates.  Will give 60 mg IV lasix now.   CCM following for sepsis and A/C respiratory failure.   Length of Stay: 1  Annamaria Helling  07/13/2016, 1:49 PM  Advanced Heart Failure Team Pager [phone] (M-F; 7a - 4p)  Please contact [facility] for night-coverage after hours (4p -7a ) and weekends on [email or web address]  Patient seen and examined with Oda Kilts, PA-C. We discussed all aspects of the encounter. I agree with the assessment and plan as stated above.   Ms Pinales is well known to [place] from the HF Clinic she has severe PAH which is primarily pulmonary venous HTN (diastolic HF). Now admitted with ecoli urosepsis and possible enterobacter which required volume resuscitation. Creatinine up from 2.2->3.0. Weight now up 20 pounds from baseline. She is very SOB and orthopneic with sense of impending doom. CXR reviewed personally and shows mild CHF. She received 21m IV lasix about 30 mins ago. Will give another 669mof IV lasix and metolazone 73m61mWill get stat ABG. May need BIPAP or intubation if not improving soon. I have contacted CCM to re-evaluate as well.   The patient is critically ill with multiple organ systems failure and requires high complexity decision making for assessment and support, frequent evaluation and titration of therapies, application of advanced monitoring technologies and extensive interpretation of multiple databases.   Critical Care  Time devoted to patient care services described in this note is 35 Minutes.   Bensimhon, Daniel,MD 4:08 PM

## 2016-07-13 NOTE — Progress Notes (Signed)
PHARMACY - PHYSICIAN COMMUNICATION CRITICAL VALUE ALERT - BLOOD CULTURE IDENTIFICATION (BCID)  Results for orders placed or performed during the hospital encounter of 07/12/16  Blood Culture ID Panel (Reflexed) (Collected: 07/12/2016  2:37 PM)  Result Value Ref Range   Enterococcus species NOT DETECTED NOT DETECTED   Listeria monocytogenes NOT DETECTED NOT DETECTED   Staphylococcus species NOT DETECTED NOT DETECTED   Staphylococcus aureus NOT DETECTED NOT DETECTED   Streptococcus species NOT DETECTED NOT DETECTED   Streptococcus agalactiae NOT DETECTED NOT DETECTED   Streptococcus pneumoniae NOT DETECTED NOT DETECTED   Streptococcus pyogenes NOT DETECTED NOT DETECTED   Acinetobacter baumannii NOT DETECTED NOT DETECTED   Enterobacteriaceae species DETECTED (A) NOT DETECTED   Enterobacter cloacae complex NOT DETECTED NOT DETECTED   Escherichia coli DETECTED (A) NOT DETECTED   Klebsiella oxytoca NOT DETECTED NOT DETECTED   Klebsiella pneumoniae NOT DETECTED NOT DETECTED   Proteus species NOT DETECTED NOT DETECTED   Serratia marcescens NOT DETECTED NOT DETECTED   Carbapenem resistance NOT DETECTED NOT DETECTED   Haemophilus influenzae NOT DETECTED NOT DETECTED   Neisseria meningitidis NOT DETECTED NOT DETECTED   Pseudomonas aeruginosa NOT DETECTED NOT DETECTED   Candida albicans NOT DETECTED NOT DETECTED   Candida glabrata NOT DETECTED NOT DETECTED   Candida krusei NOT DETECTED NOT DETECTED   Candida parapsilosis NOT DETECTED NOT DETECTED   Candida tropicalis NOT DETECTED NOT DETECTED    Name of physician (or Provider) Contacted:   NA  Changes to prescribed antibiotics required:   Currently receiving Vancomycin and Zosyn.  Consider narrowing to Rocephin 2 g IV q24h for E.Coli bacteremia when/if pulmonary issues resolved.     Caryl Pina 07/13/2016  7:41 AM

## 2016-07-13 NOTE — Progress Notes (Signed)
PCCM Interval Note  Reviewed blood cx PCR data with pharmacy > deceiving results, Enterbacter species was positive, but note E cloacae was not. Carbopenems would be best choice against E cloacae, aerogenes. Not clear at this time that these are the causative bugs, more likely the E coli. Will leave her on ceftriaxone for now. Low threshold to broaden to meropenem if she declines.   Baltazar Apo, MD, PhD 07/13/2016, 5:10 PM [facility] [phone] or if no answer [phone]

## 2016-07-13 NOTE — Progress Notes (Signed)
   07/13/16 1235  Clinical Encounter Type  Visited With Patient  Visit Type Follow-up  Referral From Patient;Chaplain  Consult/Referral To Chaplain  Spiritual Encounters  Spiritual Needs Literature  Stress Factors  Patient Stress Factors Health changes  Family Stress Factors None identified  Advance Directives (For Healthcare)  Does Patient Have a Medical Advance Directive? No  Does patient want to make changes to medical advance directive? Yes (Inpatient - patient requests chaplain consult to change a medical advance directive)  Type of Advance Directive [place] in Chart? Yes  Pt. Completed Advance Directive.  [employer] [phone]

## 2016-07-13 NOTE — Progress Notes (Signed)
Patients CPAP machine and 2/ 02 tanks were sent with patient to [id number]

## 2016-07-13 NOTE — Progress Notes (Signed)
Pt requested to be placed on her Cpap.  Bled in 3 lpm oxygen pt tolerating well.  RT to monitor.[name]

## 2016-07-13 NOTE — Progress Notes (Signed)
Patient has left "chest pressure" rated 5/10 on pain scale.  She denies dyspnea, RR 30's, O2 sat 95% cpap.  EKG complete.  Deferred giving nitro due to patient's bp is 81/51, have notified the attending.

## 2016-07-13 NOTE — Progress Notes (Signed)
Checked back w/ pt to ensure she was ready and would be able to sign advanced directive. She is. Pt is propped up in bed and already initialed 4 places on p. 2 that she could not reach while lying in bed before. Advised chaplain and notary on way to another pt's rm to execute another advanced directive to pls come to Mrs. Warring next -- in any case before 2 pm when she will be taken for a test.     07/13/16 1200  Clinical Encounter Type  Visited With Patient  Visit Type Follow-up;Psychological support;Spiritual support;Other (Comment) (advanced directive)  Referral From Nurse  Spiritual Encounters  Spiritual Needs Brochure;Prayer  Stress Factors  Patient Stress Factors Health changes;Loss of control  Gerrit Heck, Chaplain

## 2016-07-13 NOTE — Progress Notes (Signed)
Patient constantly c/o not being able to breath, sat's upper 90's 94-99 %. MD up to check patient ordered her to have Lasix IV and  This was given see MAR. ABG as ordered. VSS . Patient took off her 02 and placed self on her home CPAP   Machine when I went in room sat's were in the 14 's I called  RT and got her back on nasal cannula 6L and she is sating at 99%. Less anxious at this time ordered to move her to ICU will be going to 4N16 , they are cleaning bed for patient to come will call me when ready.Rapid was called to check patient with me report was given and she  Intervened to get bed for Patient.

## 2016-07-13 NOTE — Progress Notes (Signed)
  Echocardiogram 2D Echocardiogram has been performed.  April Mueller 07/13/2016, 3:57 PM

## 2016-07-13 NOTE — Progress Notes (Signed)
[facility] PROGRESS NOTE  April Mueller [id number] DOB: 1946/07/08 DOA: 07/12/2016   PCP: Melinda Crutch, MD (Inactive)     Assessment/Plan: Active Problems:   Secondary pulmonary arterial hypertension   Rheumatoid arthritis ([place])   COPD   OSA on CPAP   Chronic diastolic heart failure (HCC)   Chronic respiratory failure with hypoxia (HCC)   Normocytic anemia   CKD (chronic kidney disease) stage 3, GFR 30-59 ml/min   HTN (hypertension)   Myelodysplastic syndrome (HCC)   Persistent atrial fibrillation (HCC)   Sepsis (HCC)   Thrombocytopenia (HCC)   Urinary tract infection with hematuria   Fever   Hypotension   April Mueller is a 70 y.o. female with Past medical history of CKD 3, COPD, chronic respiratory failure, pulmonary hypertension.Patient presented with complaints of fever and chills as well as nausea. Patient reported poor appetite for last 2 days. She mentions that she has been taking all her medications on regular basis.   Patient was hypotensive as well as had leukocytosis meeting sepsis criteria with fever. Patient was given 3 L of IV fluid bolus. Critical care consulted recommended the patient can be admitted to step down unit  Assessment and plan Sepsis secondary to UTI, blood culture positive for gram-negative rods Continue broad-spectrum antibiotics, BCID positive Renal ultrasound without nephrolithiasis Chest x-ray also shows asymmetric edema. Currently on broad-spectrum antibiotics vancomycin and Zosyn. MRSA negative. Will DC vancomycin/Zosyn and start Rocephin Monitor culture. Recheck UA.   Thrombocytopenia. Likely secondary to sepsis Platelet count significantly low. No active bleeding. INR 1.41, suspect DIC     History of chronic diastolic heart failure.severe MR  History of chronic pulmonary hypertension. Continue generous IV fluids. We'll continue to closely monitor the patient for volume overload. Holding antihypertensive  medication as well as diuretic. Repeat 2-D echo, cards will be consulted for mx of HF in the setting of fluid resuscitation   Chronic kidney disease stage III. Baseline appears to be around 2.0 Serum creatinine currently mildly worsened due to dehydration in the setting of sepsis and nausea Continue IV fluids    OSA  Continue home C Pap   Rheumatoid arthritis. The setting of thrombocytopenia as well as active sepsis we will hold Plaquenil.    A. fib. Not on any anticoagulation due to recurrent GI bleed in the past. S/p watchman procedure    Reported history of myelodysplastic syndrome. Patient's chart has myelodysplastic syndrome on her list of problem.     COPD Stable Chest x-ray showed mild pulmonary edema, most recent echo 08/17/15 showed EF of 55-60%  DVT prophylaxsis scd's   Code Status:  Full code   Family Communication: Discussed in detail with the patient, all imaging results, lab results explained to the patient   Disposition Plan: Continue stepdown     Consultants:  Critical care  cardiology  Procedures:  None  Antibiotics: Anti-infectives    Start     Dose/Rate Route Frequency Ordered Stop   07/14/16 1500  vancomycin (VANCOCIN) IVPB 1000 mg/200 mL premix     1,000 mg 200 mL/hr over 60 Minutes Intravenous Every 48 hours 07/12/16 1655     07/12/16 2200  piperacillin-tazobactam (ZOSYN) IVPB 3.375 g     3.375 g 12.5 mL/hr over 240 Minutes Intravenous Every 8 hours 07/12/16 1655     07/12/16 1500  piperacillin-tazobactam (ZOSYN) IVPB 3.375 g     3.375 g 100 mL/hr over 30 Minutes Intravenous  Once 07/12/16 1448 07/12/16 1606  07/12/16 1500  vancomycin (VANCOCIN) IVPB 1000 mg/200 mL premix     1,000 mg 200 mL/hr over 60 Minutes Intravenous  Once 07/12/16 1448 07/12/16 1639         HPI/Subjective: Complaining of sob   Objective: Vitals:   07/12/16 2300 07/12/16 2306 07/13/16 0400 07/13/16 0600  BP:  124/79 (!) 80/43 (!) 81/51  Pulse:    98   Resp:  (!) 33 (!) 34 (!) 35  Temp: 98.5 F (36.9 C)  100.2 F (37.9 C)   TempSrc: Oral  Oral   SpO2:  95% 95% 95%  Weight:   102.7 kg (226 lb 6.4 oz)   Height:        Intake/Output Summary (Last 24 hours) at 07/13/16 [id number] Last data filed at 07/13/16 0500  Gross per 24 hour  Intake             2345 ml  Output              100 ml  Net             2245 ml    Exam:  General:  No distress, speaking full paragraphs Neuro:  Nonfocal, perr HEENT:  jvd noted elevated Cardiovascular:  s1 s2 rrr ditsant, murmur 4/6 best mid, no radiation Lungs:  CTA Abdomen:  Soft, BS wnl, no r/g, nontender Musculoskeletal:  No edema Skin:  No rash    Data Reviewed: I have personally reviewed following labs and imaging studies  Micro Results Recent Results (from the past 240 hour(s))  Culture, blood (Routine x 2)     Status: None (Preliminary result)   Collection Time: 07/12/16  2:37 PM  Result Value Ref Range Status   Specimen Description BLOOD RIGHT ANTECUBITAL  Final   Special Requests BOTTLES DRAWN AEROBIC AND ANAEROBIC 5CC  Final   Culture  Setup Time   Final    GRAM NEGATIVE RODS IN BOTH AEROBIC AND ANAEROBIC BOTTLES Organism ID to follow CRITICAL RESULT CALLED TO, READ BACK BY AND VERIFIED WITH: GREG ABBOTT,PHARMD @0717  07/13/16 MKELLY,MLT    Culture PENDING  Incomplete   Report Status PENDING  Incomplete  Blood Culture ID Panel (Reflexed)     Status: Abnormal   Collection Time: 07/12/16  2:37 PM  Result Value Ref Range Status   Enterococcus species NOT DETECTED NOT DETECTED Final   Listeria monocytogenes NOT DETECTED NOT DETECTED Final   Staphylococcus species NOT DETECTED NOT DETECTED Final   Staphylococcus aureus NOT DETECTED NOT DETECTED Final   Streptococcus species NOT DETECTED NOT DETECTED Final   Streptococcus agalactiae NOT DETECTED NOT DETECTED Final   Streptococcus pneumoniae NOT DETECTED NOT DETECTED Final   Streptococcus pyogenes NOT DETECTED NOT DETECTED  Final   Acinetobacter baumannii NOT DETECTED NOT DETECTED Final   Enterobacteriaceae species DETECTED (A) NOT DETECTED Final    Comment: CRITICAL RESULT CALLED TO, READ BACK BY AND VERIFIED WITH: GREG ABBOTT, PHARMD @0717  07/13/16 MKELLY,MLT    Enterobacter cloacae complex NOT DETECTED NOT DETECTED Final   Escherichia coli DETECTED (A) NOT DETECTED Final    Comment: CRITICAL RESULT CALLED TO, READ BACK BY AND VERIFIED WITH: GREG ABBOTT, PHARMD @0717  07/13/16 MKELLY,MLT    Klebsiella oxytoca NOT DETECTED NOT DETECTED Final   Klebsiella pneumoniae NOT DETECTED NOT DETECTED Final   Proteus species NOT DETECTED NOT DETECTED Final   Serratia marcescens NOT DETECTED NOT DETECTED Final   Carbapenem resistance NOT DETECTED NOT DETECTED Final   Haemophilus influenzae NOT DETECTED NOT DETECTED  Final   Neisseria meningitidis NOT DETECTED NOT DETECTED Final   Pseudomonas aeruginosa NOT DETECTED NOT DETECTED Final   Candida albicans NOT DETECTED NOT DETECTED Final   Candida glabrata NOT DETECTED NOT DETECTED Final   Candida krusei NOT DETECTED NOT DETECTED Final   Candida parapsilosis NOT DETECTED NOT DETECTED Final   Candida tropicalis NOT DETECTED NOT DETECTED Final  Culture, blood (Routine x 2)     Status: None (Preliminary result)   Collection Time: 07/12/16  3:12 PM  Result Value Ref Range Status   Specimen Description BLOOD RIGHT FOREARM  Final   Special Requests BOTTLES DRAWN AEROBIC AND ANAEROBIC 5CC  Final   Culture  Setup Time   Final    GRAM NEGATIVE RODS IN BOTH AEROBIC AND ANAEROBIC BOTTLES CRITICAL VALUE NOTED.  VALUE IS CONSISTENT WITH PREVIOUSLY REPORTED AND CALLED VALUE.    Culture PENDING  Incomplete   Report Status PENDING  Incomplete  MRSA PCR Screening     Status: None   Collection Time: 07/12/16  6:53 PM  Result Value Ref Range Status   MRSA by PCR NEGATIVE NEGATIVE Final    Comment:        The GeneXpert MRSA Assay (FDA approved for NASAL specimens only), is one  component of a comprehensive MRSA colonization surveillance program. It is not intended to diagnose MRSA infection nor to guide or monitor treatment for MRSA infections.     Radiology Reports US Renal  Result Date: 07/13/2016 CLINICAL DATA:  Sepsis. EXAM: RENAL / URINARY TRACT ULTRASOUND COMPLETE COMPARISON:  None. FINDINGS: Right Kidney: Length: 11.0 cm. Thinning of the renal parenchyma with increased echogenicity. Capsular contours are lobular. No hydronephrosis. There is a 4.3 x 3.9 x 3.7 cm cyst from the lower kidney. Left Kidney: Length: 11.1 cm. There is increased renal echogenicity and mild lobular contours. No hydronephrosis. Two lower pole cysts in measuring 3.6 x 3.5 x 4.6 cm, with an adjacent 1.7 cm cyst. Bladder: Appears normal for degree of bladder distention. IMPRESSION: Findings consistent with chronic medical renal disease. No hydronephrosis. Bilateral renal cysts. Electronically Signed   By: Jeb Levering M.D.   On: 07/13/2016 03:23   Dg Chest Port 1 View  Result Date: 07/12/2016 CLINICAL DATA:  Sepsis.  Fever, shortness of Breath EXAM: PORTABLE CHEST 1 VIEW COMPARISON:  02/20/2016 FINDINGS: Cardiomegaly with vascular congestion and mild bilateral airspace opacities, left slightly greater than right. I favor this represents asymmetric edema. No visible effusions or acute bony abnormality. IMPRESSION: Suspect mild asymmetric pulmonary edema. Electronically Signed   By: Rolm Baptise M.D.   On: 07/12/2016 15:49     CBC  Recent Labs Lab 07/09/16 1000 07/12/16 1446 07/12/16 1933 07/13/16 0446  WBC 5.9 17.5* 21.3* 20.2*  HGB 11.4* 11.4* 10.4* 10.8*  HCT 37.9 37.4 33.9* 35.7*  PLT 88* 68* 75* PENDING  MCV 98.4 96.9 98.0 98.1  MCH 29.6 29.5 30.1 29.7  MCHC 30.1* 30.5 30.7 30.3  RDW 16.1* 16.1* 16.5* 16.7*  LYMPHSABS 0.7* 0.6* 0.7 0.6*  MONOABS 0.5 1.3* 1.1* 1.1*  EOSABS 0.0 0.0 0.0 0.0  BASOSABS 0.0 0.0 0.0 0.0    Chemistries   Recent Labs Lab 07/09/16 1000  07/12/16 1446 07/13/16 0446  NA 144 137 137  K 4.2 4.5 4.6  CL  --  103 104  CO2 30* 27 24  GLUCOSE 118 116* 96  BUN 39.0* 46* 48*  CREATININE 2.0* 2.94* 2.99*  CALCIUM 10.8* 10.1 10.1  MG  --   --  1.9  AST 16 20 18   ALT 12 14 15   ALKPHOS 127 107 88  BILITOT 0.59 0.9 1.1   ------------------------------------------------------------------------------------------------------------------ estimated creatinine clearance is 21 mL/min (by C-G formula based on SCr of 2.99 mg/dL (H)). ------------------------------------------------------------------------------------------------------------------ No results for input(s): HGBA1C in the last 72 hours. ------------------------------------------------------------------------------------------------------------------ No results for input(s): CHOL, HDL, LDLCALC, TRIG, CHOLHDL, LDLDIRECT in the last 72 hours. ------------------------------------------------------------------------------------------------------------------ No results for input(s): TSH, T4TOTAL, T3FREE, THYROIDAB in the last 72 hours.  Invalid input(s): FREET3 ------------------------------------------------------------------------------------------------------------------ No results for input(s): VITAMINB12, FOLATE, FERRITIN, TIBC, IRON, RETICCTPCT in the last 72 hours.  Coagulation profile  Recent Labs Lab 07/12/16 1933  INR 1.41    No results for input(s): DDIMER in the last 72 hours.  Cardiac Enzymes  Recent Labs Lab 07/13/16 0646  TROPONINI 0.05*   ------------------------------------------------------------------------------------------------------------------ Invalid input(s): POCBNP   CBG: No results for input(s): GLUCAP in the last 168 hours.     Studies: US Renal  Result Date: 07/13/2016 CLINICAL DATA:  Sepsis. EXAM: RENAL / URINARY TRACT ULTRASOUND COMPLETE COMPARISON:  None. FINDINGS: Right Kidney: Length: 11.0 cm. Thinning of the renal  parenchyma with increased echogenicity. Capsular contours are lobular. No hydronephrosis. There is a 4.3 x 3.9 x 3.7 cm cyst from the lower kidney. Left Kidney: Length: 11.1 cm. There is increased renal echogenicity and mild lobular contours. No hydronephrosis. Two lower pole cysts in measuring 3.6 x 3.5 x 4.6 cm, with an adjacent 1.7 cm cyst. Bladder: Appears normal for degree of bladder distention. IMPRESSION: Findings consistent with chronic medical renal disease. No hydronephrosis. Bilateral renal cysts. Electronically Signed   By: Jeb Levering M.D.   On: 07/13/2016 03:23   Dg Chest Port 1 View  Result Date: 07/12/2016 CLINICAL DATA:  Sepsis.  Fever, shortness of Breath EXAM: PORTABLE CHEST 1 VIEW COMPARISON:  02/20/2016 FINDINGS: Cardiomegaly with vascular congestion and mild bilateral airspace opacities, left slightly greater than right. I favor this represents asymmetric edema. No visible effusions or acute bony abnormality. IMPRESSION: Suspect mild asymmetric pulmonary edema. Electronically Signed   By: Rolm Baptise M.D.   On: 07/12/2016 15:49      No results found for: HGBA1C Lab Results  Component Value Date   LDLCALC 43 04/02/2016   CREATININE 2.99 (H) 07/13/2016       Scheduled Meds: . atorvastatin  10 mg Oral Daily  . neomycin-polymyxin b-dexamethasone  1 application Both Eyes QHS  . omega-3 acid ethyl esters  1 g Oral Daily  . piperacillin-tazobactam (ZOSYN)  IV  3.375 g Intravenous Q8H  . polyvinyl alcohol  1 drop Both Eyes QHS  . sodium chloride flush  3 mL Intravenous Q12H  . [START ON 07/14/2016] vancomycin  1,000 mg Intravenous Q48H   Continuous Infusions: . sodium chloride 75 mL/hr at 07/12/16 2056     LOS: 1 day    Time spent: >30 MINS    [facility]  [facility] Pager [phone]. If 7PM-7AM, please contact night-coverage at [email or web address], password [email or web address] 07/13/2016, 8:22 AM  LOS: 1 day

## 2016-07-13 NOTE — Progress Notes (Signed)
Name: April Mueller MRN: [id number] DOB: [birth year]    ADMISSION DATE:  07/12/2016 CONSULTATION DATE:  12/7  REFERRING MD :  DR Cathleen Fears  CHIEF COMPLAINT:  hypotension  BRIEF PATIENT DESCRIPTION: 70 yr old PA htn, presents fevers, sepsis  STUDIES:  12/7 renal US>>> no hydronephrosis 12/8 CT abdomen >> cardiomegaly, scattered hepatic cysts larger than prior, micronodular liver, pancreatic fat stranding, no bowel or appendix abnormality  Cultures:  Urine 12/7 >> E coli >>  Resp 12/7 >>  Blood 12/7 >> GNR >>  Blood PCR 12/7 >> enterobacter and E coli   HISTORY OF PRESENT ILLNESS: Very Pleasant 70 y.o. female with Past medical history of CKD 3, COPD ( [facility], [place]), chronic respiratory failure, pulmonary hypertension group 2. Also h/o MR with rejected Mitral valve clip procedure by [facility]. To note July visit for unclear anemia, GI bleed work up neg overall. Patient presented with complaints of fever and chills as well as nausea. Patient reported poor appetite for last 2 days. She had chills,no diarhea, no recent abx. She presented ot ED, UA pos, had some bloating lower abdomen. Noted JVF by EDP and pcxr with int prominence after fluid given. Asked ot evaluate for ICU admission and for concerns volume status with sepsis.  SUBJECTIVE:  "I feel terrible, weak" Has been hemodynamically stable but feels worse, more SOB. Note significantly above her usual dry wt.  Blood cx positive as above  VITAL SIGNS: Temp:  [98.1 F (36.7 C)-100.2 F (37.9 C)] 99.6 F (37.6 C) (12/08 1337) Pulse Rate:  [63-101] 98 (12/08 1618) Resp:  [18-35] 28 (12/08 1618) BP: (80-127)/(43-79) 127/63 (12/08 1618) SpO2:  [91 %-100 %] 92 % (12/08 1337) Weight:  [93 kg (205 lb 0.4 oz)-102.7 kg (226 lb 6.4 oz)] 102.7 kg (226 lb 6.4 oz) (12/08 0400)  PHYSICAL EXAMINATION: General:  Ill appearing, in moderate resp distress Neuro:  Sleepy, wakes easily to voice, globally weak but non-focal HEENT:  jvd noted  elevated Cardiovascular:  s1 s2 rrr ditsant, murmur 4/6 best mid, no radiation Lungs:  CTA Abdomen:  Soft, BS wnl, no r/g, nontender Musculoskeletal:  No edema Skin:  No rash   Recent Labs Lab 07/09/16 1000 07/12/16 1446 07/13/16 0446  NA 144 137 137  K 4.2 4.5 4.6  CL  --  103 104  CO2 30* 27 24  BUN 39.0* 46* 48*  CREATININE 2.0* 2.94* 2.99*  GLUCOSE 118 116* 96    Recent Labs Lab 07/12/16 1446 07/12/16 1933 07/13/16 0446  HGB 11.4* 10.4* 10.8*  HCT 37.4 33.9* 35.7*  WBC 17.5* 21.3* 20.2*  PLT 68* 75* 73*   Ct Abdomen Pelvis Wo Contrast  Result Date: 07/13/2016 CLINICAL DATA:  Sepsis, diarrhea EXAM: CT ABDOMEN AND PELVIS WITHOUT CONTRAST TECHNIQUE: Multidetector CT imaging of the abdomen and pelvis was performed following the standard protocol without IV contrast. COMPARISON:  CT chest 05/06/2013 FINDINGS: Lower chest: There is dilatation of descending aorta up to 4.1 cm stable from prior exam. There is small right pleural effusion with right base posterior atelectasis. Mild bronchitic changes are noted bilateral lower lobe. Cardiomegaly is noted. Hepatobiliary: Study is limited without IV contrast. Unenhanced liver shows no biliary ductal dilatation. Scattered hepatic cysts are again noted with progression in size from prior exam. The largest cyst in anterior aspect of the right hepatic dome measures 4 cm. Largest cyst in lateral segment of left hepatic lobe measures 3.4 cm. No calcified gallstones are noted within gallbladder. There is micro nodular  liver contour. Chronic liver disease or cirrhosis cannot be excluded. Pancreas: No focal abnormality is noted in unenhanced pancreas. Axial image 33 there is mild stranding of the fat surrounding the pancreatic head region. There is mild stranding of the fat inferior to pancreatic head region and just above the distal duodenum please see coronal image 66. Mild focal pancreatitis or duodenal inflammation cannot be excluded. Clinical  correlation is necessary. Spleen: Unenhanced spleen is normal. Adrenals/Urinary Tract: Again noted mild thickening of left adrenal gland. The right adrenal gland is stable. There is a cyst in midpole posterior aspect of the left kidney measures 4.1 cm. There is cortical scarring midpole of the right kidney. A cyst in lower pole of the right kidney measures 4.4 cm. No hydronephrosis or hydroureter. No nephrolithiasis. No calcified calculi are noted within under distended urinary bladder. There is nonobstructive calcified calculus in lower pole of the left kidney measures 4 mm. Stomach/Bowel: Oral contrast material was given to the patient. There is no evidence of small bowel obstruction. No thickened or dilated small bowel loops are noted. There is no pericecal inflammation. Normal appendix is partially visualized in axial image 51. Some colonic gas noted in transverse colon. Multiple descending colon diverticula. The descending colon and sigmoid colon are empty collapsed. Multiple colonic diverticula are noted sigmoid colon. There is redundant sigmoid colon. No evidence of acute pancreatitis. No distal colitis. Some liquid stool noted within rectum. Diarrhea cannot be excluded. Vascular/Lymphatic: No aortic aneurysm. Atherosclerotic calcifications of abdominal aorta and iliac arteries. Reproductive: The uterus is atrophic. There is a cyst/follicle within right ovary measures 2 cm. Other: No free abdominal air.  No large abdominal ascites. Musculoskeletal: Sagittal images of the spine shows degenerative changes thoracolumbar spine. IMPRESSION: 1. Cardiomegaly is noted. Again noted aneurysmal dilatation of descending aorta stable from prior exam. Small right pleural effusion with right base posterior medially atelectasis. 2. Scattered hepatic cysts are noted progression in size from prior exam. 3. Bilateral renal cysts.  Left nonobstructive nephrolithiasis. 4. Micro nodular liver contour. Chronic liver disease or  cirrhosis cannot be excluded. 5. There is subtle mild stranding of the fat surrounding the pancreatic head region in medial and inferior to duodenum. Mild focal pancreatitis or duodenal inflammation cannot be excluded. Clinical correlation is necessary. Study is limited without IV contrast. 6. No small bowel or colonic obstruction. No pericecal inflammation. Normal appendix. 7. Distal colonic diverticula. No evidence of acute colitis or diverticulitis. 8. There is a dominant follicle/cyst within right ovary measures 2 cm. Electronically Signed   By: Lahoma Crocker M.D.   On: 07/13/2016 13:43   Dg Chest 2 View  Result Date: 07/13/2016 CLINICAL DATA:  Fever.  Sepsis.  Shortness of breath. EXAM: CHEST  2 VIEW COMPARISON:  07/12/2016 and 02/20/2016 and CT scan of the chest dated 05/06/2013 FINDINGS: Chronic cardiomegaly. Tortuosity and chronic aneurysmal dilatation of the thoracic aorta. No acute infiltrates or effusions. Slight linear scarring in both lungs, stable. No acute bone abnormality. IMPRESSION: No acute abnormalities. Chronic cardiomegaly. 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 Electronically Signed   By: Lorriane Shire M.D.   On: 07/13/2016 11:29   US Renal  Result Date: 07/13/2016 CLINICAL DATA:  Sepsis. EXAM: RENAL / URINARY TRACT ULTRASOUND COMPLETE COMPARISON:  None. FINDINGS: Right Kidney: Length: 11.0 cm. Thinning of the renal parenchyma with increased echogenicity. Capsular contours are lobular. No hydronephrosis. There is a 4.3 x 3.9 x 3.7 cm cyst from the lower kidney. Left Kidney: Length: 11.1 cm.  There is increased renal echogenicity and mild lobular contours. No hydronephrosis. Two lower pole cysts in measuring 3.6 x 3.5 x 4.6 cm, with an adjacent 1.7 cm cyst. Bladder: Appears normal for degree of bladder distention. IMPRESSION: Findings consistent with chronic medical renal disease. No hydronephrosis. Bilateral renal cysts. Electronically Signed   By: Jeb Levering M.D.   On: 07/13/2016 03:23   Dg Chest Port 1 View  Result Date: 07/12/2016 CLINICAL DATA:  Sepsis.  Fever, shortness of Breath EXAM: PORTABLE CHEST 1 VIEW COMPARISON:  02/20/2016 FINDINGS: Cardiomegaly with vascular congestion and mild bilateral airspace opacities, left slightly greater than right. I favor this represents asymmetric edema. No visible effusions or acute bony abnormality. IMPRESSION: Suspect mild asymmetric pulmonary edema. Electronically Signed   By: Rolm Baptise M.D.   On: 07/12/2016 15:49    ASSESSMENT / PLAN:  UTI Severe sepsis without evidence shock CKD, acute on chronic renal failure, ATN. Stable on repeat BMP am 12/8 Enlarging hepatic cysts on CT abdomen, ? Significance.  KNOWN moderate PA HTN RA on Plaquanil. Immunosuppressed.  COPD Diastolic heart dz Cad OSA on cpap  RECS:  - ABG now - check lactic acid, BMP now - continue ceftriaxone for now - check lipase / amylase - will change her abx to imipenem given the PCR findings that show enterobacter + E coli; carbapenems should give best coverage if enterobacter ultimately isolated. Can narrow if this is just E coli - may need further GI eval of her hepatic cysts. I will send LFT's, coags.  - d/c plaquanil during the acute illness  - agree with CAREFUL diuresis now. She will not tolerate high preload. Follow BP closely.  - CPAP qhs, with naps and also prn.  - I believe that she needs to move to ICU given her tenuous resp status and her very narrow window for volume. Currently total body volume up, may not tolerate much volume removal in setting sepsis.  - appreciate DR Bensimhon's input   Independent CC time 40 minutes.    Baltazar Apo, MD, PhD 07/13/2016, 4:48 PM [facility] [phone] or if no answer [phone]

## 2016-07-14 ENCOUNTER — Inpatient Hospital Stay (HOSPITAL_COMMUNITY): Payer: Medicare Other

## 2016-07-14 DIAGNOSIS — I481 Persistent atrial fibrillation: Secondary | ICD-10-CM

## 2016-07-14 DIAGNOSIS — J9621 Acute and chronic respiratory failure with hypoxia: Secondary | ICD-10-CM

## 2016-07-14 LAB — URINE CULTURE: Culture: >=100000 — AB

## 2016-07-14 LAB — CBC
HCT: 34.2 % — ABNORMAL LOW (ref 36.0–46.0)
Hemoglobin: 10.4 g/dL — ABNORMAL LOW (ref 12.0–15.0)
MCH: 29.5 pg (ref 26.0–34.0)
MCHC: 30.4 g/dL (ref 30.0–36.0)
MCV: 97.2 fL (ref 78.0–100.0)
PLATELETS: 59 10*3/uL — AB (ref 150–400)
RBC: 3.52 MIL/uL — AB (ref 3.87–5.11)
RDW: 16.9 % — AB (ref 11.5–15.5)
WBC: 12.8 10*3/uL — AB (ref 4.0–10.5)

## 2016-07-14 LAB — COMPREHENSIVE METABOLIC PANEL
ALT: 15 U/L (ref 14–54)
AST: 19 U/L (ref 15–41)
Albumin: 2.6 g/dL — ABNORMAL LOW (ref 3.5–5.0)
Alkaline Phosphatase: 74 U/L (ref 38–126)
Anion gap: 8 (ref 5–15)
BUN: 53 mg/dL — ABNORMAL HIGH (ref 6–20)
CHLORIDE: 103 mmol/L (ref 101–111)
CO2: 25 mmol/L (ref 22–32)
CREATININE: 2.76 mg/dL — AB (ref 0.44–1.00)
Calcium: 9.8 mg/dL (ref 8.9–10.3)
GFR, EST AFRICAN AMERICAN: 19 mL/min — AB (ref >60)
GFR, EST NON AFRICAN AMERICAN: 17 mL/min — AB (ref >60)
Glucose, Bld: 127 mg/dL — ABNORMAL HIGH (ref 65–99)
POTASSIUM: 4.5 mmol/L (ref 3.5–5.1)
SODIUM: 136 mmol/L (ref 135–145)
Total Bilirubin: 0.6 mg/dL (ref 0.3–1.2)
Total Protein: 6.7 g/dL (ref 6.5–8.1)

## 2016-07-14 LAB — PROTIME-INR
INR: 1.26
PROTHROMBIN TIME: 15.9 s — AB (ref 11.4–15.2)

## 2016-07-14 MED ORDER — METOPROLOL TARTRATE 5 MG/5ML IV SOLN: 2.5000 mg | INTRAVENOUS | Status: DC | PRN

## 2016-07-14 NOTE — Progress Notes (Signed)
Advanced Heart Failure Rounding Note   Subjective:     70 y.o. 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   Admitted with ecoli urosespis. WBC 17.5. Procalcitonin 40.47.Transferred to ICU on 12/8 for respiratory failure and volume overload.   Has diuresed well overnight though I/Os inaccurate. Weigh down 10+ pounds. Says she is still SOB but looks much more comfortable. Creatinine improved. Lactat down to 1.7  Objective:   Weight Range:  Vital Signs:   Temp:  [97.9 F (36.6 C)-100.1 F (37.8 C)] 98.6 F (37 C) (12/09 0912) Pulse Rate:  [84-101] 88 (12/09 0912) Resp:  [24-31] 26 (12/09 0912) BP: (98-127)/(57-80) 100/80 (12/09 0912) SpO2:  [92 %-98 %] 98 % (12/09 0912) Weight:  [96.3 kg (212 lb 4.9 oz)-96.6 kg (212 lb 15.4 oz)] 96.3 kg (212 lb 4.9 oz) (12/09 0415) Last BM Date: 07/13/16  Weight change: Filed Weights   07/13/16 0400 07/13/16 1904 07/14/16 0415  Weight: 102.7 kg (226 lb 6.4 oz) 96.6 kg (212 lb 15.4 oz) 96.3 kg (212 lb 4.9 oz)    Intake/Output:   Intake/Output Summary (Last 24 hours) at 07/14/16 1200 Last data filed at 07/14/16 0919  Gross per 24 hour  Intake               50 ml  Output              650 ml  Net             -600 ml     Physical Exam: General:  Weak. Lying in bed on 6L [place]  Resp status ok  HEENT: Normal Neck: supple. JVP 8. Carotids 2+ bilat; no bruits. No lymphadenopathy or thyromegaly appreciated. Cor: PMI nondisplaced. Irregular. . 3/6 MR.  Lungs: Diminished throughout. Clear anteriorly  Abdomen: soft, NT, ND, no HSM. No bruits or masses. +BS  Extremities: no cyanosis, clubbing, rash. Tr-1+ edema.  Neuro: alert & orientedx3, cranial nerves grossly intact. moves all 4 extremities w/o difficulty. Affect flat.  Telemetry: Reviewed personally, Afib 80-90s  Labs: Basic Metabolic Panel:  Recent Labs Lab 07/09/16 1000   07/12/16 1446 07/13/16 0446 07/13/16 1650 07/14/16 0210  NA 144  --  137 137 135 136  K 4.2  --  4.5 4.6 4.7 4.5  CL  --   --  103 104 102 103  CO2 30*  --  27 24 20* 25  GLUCOSE 118  --  116* 96 85 127*  BUN 39.0*  --  46* 48* 46* 53*  CREATININE 2.0*  --  2.94* 2.99* 2.89* 2.76*  CALCIUM 10.8*  < > 10.1 10.1 10.2 9.8  MG  --   --   --  1.9  --   --   < > = values in this interval not displayed.  Liver Function Tests:  Recent Labs Lab 07/09/16 1000 07/12/16 1446 07/13/16 0446 07/14/16 0210  AST 16 20 18 19   ALT 12 14 15 15   ALKPHOS 127 107 88 74  BILITOT 0.59 0.9 1.1 0.6  PROT 7.6 7.3 7.1 6.7  ALBUMIN 3.2* 3.1* 2.7* 2.6*    Recent Labs Lab 07/12/16 1446 07/13/16 1650  LIPASE 15 14   No results for input(s): AMMONIA in the last 168 hours.  CBC:  Recent Labs Lab 07/09/16 1000 07/12/16 1446 07/12/16 1933 07/13/16 0446 07/14/16 0210  WBC 5.9 17.5* 21.3* 20.2* 12.8*  NEUTROABS 4.6 15.6* 19.5* 18.6*  --   HGB 11.4* 11.4* 10.4* 10.8* 10.4*  HCT 37.9 37.4 33.9* 35.7* 34.2*  MCV 98.4 96.9 98.0 98.1 97.2  PLT 88* 68* 75* 73* 59*    Cardiac Enzymes:  Recent Labs Lab 07/13/16 0646 07/13/16 1204 07/13/16 1752  TROPONINI 0.05* 0.05* 0.06*    BNP: BNP (last 3 results)  Recent Labs  04/02/16 0909 04/11/16 0922 07/12/16 1447  BNP 317.2* 180.4* 318.6*    ProBNP (last 3 results) No results for input(s): PROBNP in the last 8760 hours.    Other results:  Imaging: Ct Abdomen Pelvis Wo Contrast  Result Date: 07/13/2016 CLINICAL DATA:  Sepsis, diarrhea EXAM: CT ABDOMEN AND PELVIS WITHOUT CONTRAST TECHNIQUE: Multidetector CT imaging of the abdomen and pelvis was performed following the standard protocol without IV contrast. COMPARISON:  CT chest 05/06/2013 FINDINGS: Lower chest: There is dilatation of descending aorta up to 4.1 cm stable from prior exam. There is small right pleural effusion with right base posterior atelectasis. Mild bronchitic changes  are noted bilateral lower lobe. Cardiomegaly is noted. Hepatobiliary: Study is limited without IV contrast. Unenhanced liver shows no biliary ductal dilatation. Scattered hepatic cysts are again noted with progression in size from prior exam. The largest cyst in anterior aspect of the right hepatic dome measures 4 cm. Largest cyst in lateral segment of left hepatic lobe measures 3.4 cm. No calcified gallstones are noted within gallbladder. There is micro nodular liver contour. Chronic liver disease or cirrhosis cannot be excluded. Pancreas: No focal abnormality is noted in unenhanced pancreas. Axial image 33 there is mild stranding of the fat surrounding the pancreatic head region. There is mild stranding of the fat inferior to pancreatic head region and just above the distal duodenum please see coronal image 66. Mild focal pancreatitis or duodenal inflammation cannot be excluded. Clinical correlation is necessary. Spleen: Unenhanced spleen is normal. Adrenals/Urinary Tract: Again noted mild thickening of left adrenal gland. The right adrenal gland is stable. There is a cyst in midpole posterior aspect of the left kidney measures 4.1 cm. There is cortical scarring midpole of the right kidney. A cyst in lower pole of the right kidney measures 4.4 cm. No hydronephrosis or hydroureter. No nephrolithiasis. No calcified calculi are noted within under distended urinary bladder. There is nonobstructive calcified calculus in lower pole of the left kidney measures 4 mm. Stomach/Bowel: Oral contrast material was given to the patient. There is no evidence of small bowel obstruction. No thickened or dilated small bowel loops are noted. There is no pericecal inflammation. Normal appendix is partially visualized in axial image 51. Some colonic gas noted in transverse colon. Multiple descending colon diverticula. The descending colon and sigmoid colon are empty collapsed. Multiple colonic diverticula are noted sigmoid colon. There  is redundant sigmoid colon. No evidence of acute pancreatitis. No distal colitis. Some liquid stool noted within rectum. Diarrhea cannot be excluded. Vascular/Lymphatic: No aortic aneurysm. Atherosclerotic calcifications of abdominal aorta and iliac arteries. Reproductive: The uterus is atrophic. There is a cyst/follicle within right ovary measures 2 cm. Other: No free abdominal air.  No large abdominal ascites. Musculoskeletal: Sagittal images of the spine shows degenerative changes thoracolumbar spine. IMPRESSION: 1. Cardiomegaly is noted. Again noted aneurysmal dilatation of descending aorta stable from prior exam. Small right pleural effusion with right base posterior medially atelectasis. 2. Scattered hepatic cysts are noted progression in size from prior exam. 3. Bilateral renal cysts.  Left nonobstructive nephrolithiasis. 4. Micro nodular liver contour. Chronic  liver disease or cirrhosis cannot be excluded. 5. There is subtle mild stranding of the fat surrounding the pancreatic head region in medial and inferior to duodenum. Mild focal pancreatitis or duodenal inflammation cannot be excluded. Clinical correlation is necessary. Study is limited without IV contrast. 6. No small bowel or colonic obstruction. No pericecal inflammation. Normal appendix. 7. Distal colonic diverticula. No evidence of acute colitis or diverticulitis. 8. There is a dominant follicle/cyst within right ovary measures 2 cm. Electronically Signed   By: Lahoma Crocker M.D.   On: 07/13/2016 13:43   Dg Chest 2 View  Result Date: 07/13/2016 CLINICAL DATA:  Fever.  Sepsis.  Shortness of breath. EXAM: CHEST  2 VIEW COMPARISON:  07/12/2016 and 02/20/2016 and CT scan of the chest dated 05/06/2013 FINDINGS: Chronic cardiomegaly. Tortuosity and chronic aneurysmal dilatation of the thoracic aorta. No acute infiltrates or effusions. Slight linear scarring in both lungs, stable. No acute bone abnormality. IMPRESSION: No acute abnormalities. Chronic  cardiomegaly. 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 Electronically Signed   By: Lorriane Shire M.D.   On: 07/13/2016 11:29   US Renal  Result Date: 07/13/2016 CLINICAL DATA:  Sepsis. EXAM: RENAL / URINARY TRACT ULTRASOUND COMPLETE COMPARISON:  None. FINDINGS: Right Kidney: Length: 11.0 cm. Thinning of the renal parenchyma with increased echogenicity. Capsular contours are lobular. No hydronephrosis. There is a 4.3 x 3.9 x 3.7 cm cyst from the lower kidney. Left Kidney: Length: 11.1 cm. There is increased renal echogenicity and mild lobular contours. No hydronephrosis. Two lower pole cysts in measuring 3.6 x 3.5 x 4.6 cm, with an adjacent 1.7 cm cyst. Bladder: Appears normal for degree of bladder distention. IMPRESSION: Findings consistent with chronic medical renal disease. No hydronephrosis. Bilateral renal cysts. Electronically Signed   By: Jeb Levering M.D.   On: 07/13/2016 03:23   Dg Chest Port 1 View  Result Date: 07/12/2016 CLINICAL DATA:  Sepsis.  Fever, shortness of Breath EXAM: PORTABLE CHEST 1 VIEW COMPARISON:  02/20/2016 FINDINGS: Cardiomegaly with vascular congestion and mild bilateral airspace opacities, left slightly greater than right. I favor this represents asymmetric edema. No visible effusions or acute bony abnormality. IMPRESSION: Suspect mild asymmetric pulmonary edema. Electronically Signed   By: Rolm Baptise M.D.   On: 07/12/2016 15:49      Medications:     Scheduled Medications: . atorvastatin  10 mg Oral Daily  . cefTRIAXone (ROCEPHIN)  IV  2 g Intravenous Q24H  . furosemide  80 mg Intravenous BID  . neomycin-polymyxin b-dexamethasone  1 application Both Eyes QHS  . omega-3 acid ethyl esters  1 g Oral Daily  . polyvinyl alcohol  1 drop Both Eyes QHS  . sodium chloride flush  3 mL Intravenous Q12H     Infusions:   PRN Medications:  acetaminophen **OR** acetaminophen, albuterol, metoprolol, ondansetron **OR** ondansetron (ZOFRAN)  IV   Assessment:   1. Sepsis 2/2 E-Coli Bacteremia due to UTI - Continue Ceftriaxone per primary.  Got Vanc/Zosyn on admission.  - UCx and BCx both positive for E-Coli - CT ABD/Pelvis with scattered hepatic cysts (increased in size from prior exam), bilateral renal cysts, ?inflamatory changes around head of pancreas, Normal appendix, no acute colitis or diverticulitis.  - CCM following. 2. Acute on Chronic Diastolic CHF - Volume status much improved. Still 5-10 pounds from baseline weight. Will continue IV diuresis one more day 3. Acute on chronic hypoxic respiratory failure - Much improved after diuresis.  - On chronic 02 4-5L at home.   -  She is requesting BIPAP for comfort. Will order.  - Continue diuresis one more day - Repeat CXR - CCM following as above.  4. ARF on CKD stage III - Improving. Baseline creatinine 2.0-2.2 5. MR - Mild to Moderate MR by Echo 1/17 - Repeat Echo pending.  6. Atrial Fib, Persistent - Rate back down with treatment of sepsis - Not AC candidate with recurrent severe GI bleeding.  7. PAH - primarily [place] group 2 based on RHC 3/17 - Likely contributor to her dyspnea, but poor candidate for pulmonary vasodilators.  D/W Dr. Chase Caller at bedside. Keep in ICU one more day.    Length of Stay: 2   Bensimhon, Daniel MD 07/14/2016, 12:00 PM  Advanced Heart Failure Team Pager [phone] (M-F; [place])  Please contact [facility] for night-coverage after hours (4p -7a ) and weekends on [email or web address]

## 2016-07-14 NOTE — Progress Notes (Signed)
Name: April Mueller MRN: [id number] DOB: 1945-09-12    ADMISSION DATE:  07/12/2016 CONSULTATION DATE:  12/7  REFERRING MD :  DR Cathleen Fears  CHIEF COMPLAINT:  hypotension  BRIEF PATIENT DESCRIPTION: 70 yr old PA htn, presents fevers, sepsis  brief: Very Pleasant 70 y.o. female with Past medical history of CKD 3, COPD ( [facility], [place]), chronic respiratory failure, pulmonary hypertension group 2. Also h/o MR with rejected Mitral valve clip procedure by [facility]. To note July visit for unclear anemia, GI bleed work up neg overall. Patient presented with complaints of fever and chills as well as nausea. Patient reported poor appetite for last 2 days. She had chills,no diarhea, no recent abx. She presented ot ED, UA pos, had some bloating lower abdomen. Noted JVF by EDP and pcxr with int prominence after fluid given. Asked ot evaluate for ICU admission and for concerns volume status with sepsis.   STUDIES. event 12/7 renal US>>> no hydronephrosis.Blood culture with e colii and urine culture as well 12/8 CT abdomen >> cardiomegaly, scattered hepatic cysts larger than prior, micronodular liver, pancreatic fat stranding, no bowel or appendix abnormality. "I feel terrible, weak" Has been hemodynamically stable but feels worse, more SOB. Note significantly above her usual dry wt.  Blood cx positive as above    SUBJECTIVE/OVERNIGHT/INTERVAL HX 07/14/16 - d/w dR Bensimohn at bedside - per him patient looks much better than yesterday and he plans for more diuresis on ICU side. Per Patient - still not feeling better. On 4-5L [place]. RR 31 and she is asking for bipap. AKI some better  VITAL SIGNS: Temp:  [97.9 F (36.6 C)-100.1 F (37.8 C)] 98.6 F (37 C) (12/09 0912) Pulse Rate:  [84-101] 88 (12/09 0912) Resp:  [24-31] 26 (12/09 0912) BP: (98-127)/(57-80) 100/80 (12/09 0912) SpO2:  [92 %-98 %] 98 % (12/09 0912) Weight:  [96.3 kg (212 lb 4.9 oz)-96.6 kg (212 lb 15.4 oz)] 96.3 kg (212 lb 4.9 oz) (12/09  0415)  PHYSICAL EXAMINATION: General:  Ill appearing, in milde resp distress Neuro:  Sleepy, wakes easily to voice, globally weak but non-focal HEENT:  jvd noted elevated Cardiovascular:  s1 s2 rrr ditsant, murmur 4/6 best mid, no radiation Lungs:  CTA. RR 31 but not paradoxical Abdomen:  Soft, BS wnl, no r/g, nontender Musculoskeletal:  No edema Skin:  No rash  PULMONARY  Recent Labs Lab 07/13/16 1641  PHART 7.303*  PCO2ART 47.4  PO2ART 70.1*  HCO3 22.6  O2SAT 90.8    CBC  Recent Labs Lab 07/12/16 1933 07/13/16 0446 07/14/16 0210  HGB 10.4* 10.8* 10.4*  HCT 33.9* 35.7* 34.2*  WBC 21.3* 20.2* 12.8*  PLT 75* 73* 59*    COAGULATION  Recent Labs Lab 07/12/16 1933 07/14/16 0210  INR 1.41 1.26    CARDIAC   Recent Labs Lab 07/13/16 0646 07/13/16 1204 07/13/16 1752  TROPONINI 0.05* 0.05* 0.06*   No results for input(s): PROBNP in the last 168 hours.   CHEMISTRY  Recent Labs Lab 07/09/16 1000  07/12/16 1446 07/13/16 0446 07/13/16 1650 07/14/16 0210  NA 144  --  137 137 135 136  K 4.2  < > 4.5 4.6 4.7 4.5  CL  --   --  103 104 102 103  CO2 30*  --  27 24 20* 25  GLUCOSE 118  --  116* 96 85 127*  BUN 39.0*  --  46* 48* 46* 53*  CREATININE 2.0*  --  2.94* 2.99* 2.89* 2.76*  CALCIUM 10.8*  --  10.1 10.1 10.2 9.8  MG  --   --   --  1.9  --   --   < > = values in this interval not displayed. Estimated Creatinine Clearance: 22 mL/min (by C-G formula based on SCr of 2.76 mg/dL (H)).   LIVER  Recent Labs Lab 07/09/16 1000 07/12/16 1446 07/12/16 1933 07/13/16 0446 07/14/16 0210  AST 16 20  --  18 19  ALT 12 14  --  15 15  ALKPHOS 127 107  --  88 74  BILITOT 0.59 0.9  --  1.1 0.6  PROT 7.6 7.3  --  7.1 6.7  ALBUMIN 3.2* 3.1*  --  2.7* 2.6*  INR  --   --  1.41  --  1.26     INFECTIOUS  Recent Labs Lab 07/12/16 1447 07/12/16 1716 07/12/16 1933 07/13/16 1650  LATICACIDVEN 1.96* 1.23  --  1.7  PROCALCITON  --   --  40.47  --       ENDOCRINE CBG (last 3)  No results for input(s): GLUCAP in the last 72 hours.       IMAGING x48h  - image(s) personally visualized  -   highlighted in bold Ct Abdomen Pelvis Wo Contrast  Result Date: 07/13/2016 CLINICAL DATA:  Sepsis, diarrhea EXAM: CT ABDOMEN AND PELVIS WITHOUT CONTRAST TECHNIQUE: Multidetector CT imaging of the abdomen and pelvis was performed following the standard protocol without IV contrast. COMPARISON:  CT chest 05/06/2013 FINDINGS: Lower chest: There is dilatation of descending aorta up to 4.1 cm stable from prior exam. There is small right pleural effusion with right base posterior atelectasis. Mild bronchitic changes are noted bilateral lower lobe. Cardiomegaly is noted. Hepatobiliary: Study is limited without IV contrast. Unenhanced liver shows no biliary ductal dilatation. Scattered hepatic cysts are again noted with progression in size from prior exam. The largest cyst in anterior aspect of the right hepatic dome measures 4 cm. Largest cyst in lateral segment of left hepatic lobe measures 3.4 cm. No calcified gallstones are noted within gallbladder. There is micro nodular liver contour. Chronic liver disease or cirrhosis cannot be excluded. Pancreas: No focal abnormality is noted in unenhanced pancreas. Axial image 33 there is mild stranding of the fat surrounding the pancreatic head region. There is mild stranding of the fat inferior to pancreatic head region and just above the distal duodenum please see coronal image 66. Mild focal pancreatitis or duodenal inflammation cannot be excluded. Clinical correlation is necessary. Spleen: Unenhanced spleen is normal. Adrenals/Urinary Tract: Again noted mild thickening of left adrenal gland. The right adrenal gland is stable. There is a cyst in midpole posterior aspect of the left kidney measures 4.1 cm. There is cortical scarring midpole of the right kidney. A cyst in lower pole of the right kidney measures 4.4 cm. No  hydronephrosis or hydroureter. No nephrolithiasis. No calcified calculi are noted within under distended urinary bladder. There is nonobstructive calcified calculus in lower pole of the left kidney measures 4 mm. Stomach/Bowel: Oral contrast material was given to the patient. There is no evidence of small bowel obstruction. No thickened or dilated small bowel loops are noted. There is no pericecal inflammation. Normal appendix is partially visualized in axial image 51. Some colonic gas noted in transverse colon. Multiple descending colon diverticula. The descending colon and sigmoid colon are empty collapsed. Multiple colonic diverticula are noted sigmoid colon. There is redundant sigmoid colon. No evidence of acute pancreatitis. No distal colitis. Some liquid stool noted within rectum.  Diarrhea cannot be excluded. Vascular/Lymphatic: No aortic aneurysm. Atherosclerotic calcifications of abdominal aorta and iliac arteries. Reproductive: The uterus is atrophic. There is a cyst/follicle within right ovary measures 2 cm. Other: No free abdominal air.  No large abdominal ascites. Musculoskeletal: Sagittal images of the spine shows degenerative changes thoracolumbar spine. IMPRESSION: 1. Cardiomegaly is noted. Again noted aneurysmal dilatation of descending aorta stable from prior exam. Small right pleural effusion with right base posterior medially atelectasis. 2. Scattered hepatic cysts are noted progression in size from prior exam. 3. Bilateral renal cysts.  Left nonobstructive nephrolithiasis. 4. Micro nodular liver contour. Chronic liver disease or cirrhosis cannot be excluded. 5. There is subtle mild stranding of the fat surrounding the pancreatic head region in medial and inferior to duodenum. Mild focal pancreatitis or duodenal inflammation cannot be excluded. Clinical correlation is necessary. Study is limited without IV contrast. 6. No small bowel or colonic obstruction. No pericecal inflammation. Normal  appendix. 7. Distal colonic diverticula. No evidence of acute colitis or diverticulitis. 8. There is a dominant follicle/cyst within right ovary measures 2 cm. Electronically Signed   By: Lahoma Crocker M.D.   On: 07/13/2016 13:43   Dg Chest 2 View  Result Date: 07/13/2016 CLINICAL DATA:  Fever.  Sepsis.  Shortness of breath. EXAM: CHEST  2 VIEW COMPARISON:  07/12/2016 and 02/20/2016 and CT scan of the chest dated 05/06/2013 FINDINGS: Chronic cardiomegaly. Tortuosity and chronic aneurysmal dilatation of the thoracic aorta. No acute infiltrates or effusions. Slight linear scarring in both lungs, stable. No acute bone abnormality. IMPRESSION: No acute abnormalities. Chronic cardiomegaly. 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 Electronically Signed   By: Lorriane Shire M.D.   On: 07/13/2016 11:29   US Renal  Result Date: 07/13/2016 CLINICAL DATA:  Sepsis. EXAM: RENAL / URINARY TRACT ULTRASOUND COMPLETE COMPARISON:  None. FINDINGS: Right Kidney: Length: 11.0 cm. Thinning of the renal parenchyma with increased echogenicity. Capsular contours are lobular. No hydronephrosis. There is a 4.3 x 3.9 x 3.7 cm cyst from the lower kidney. Left Kidney: Length: 11.1 cm. There is increased renal echogenicity and mild lobular contours. No hydronephrosis. Two lower pole cysts in measuring 3.6 x 3.5 x 4.6 cm, with an adjacent 1.7 cm cyst. Bladder: Appears normal for degree of bladder distention. IMPRESSION: Findings consistent with chronic medical renal disease. No hydronephrosis. Bilateral renal cysts. Electronically Signed   By: Jeb Levering M.D.   On: 07/13/2016 03:23   Dg Chest Port 1 View  Result Date: 07/12/2016 CLINICAL DATA:  Sepsis.  Fever, shortness of Breath EXAM: PORTABLE CHEST 1 VIEW COMPARISON:  02/20/2016 FINDINGS: Cardiomegaly with vascular congestion and mild bilateral airspace opacities, left slightly greater than right. I favor this represents asymmetric edema. No visible  effusions or acute bony abnormality. IMPRESSION: Suspect mild asymmetric pulmonary edema. Electronically Signed   By: Rolm Baptise M.D.   On: 07/12/2016 15:49     Cultures:  Recent Results (from the past 240 hour(s))  Culture, blood (Routine x 2)     Status: Abnormal (Preliminary result)   Collection Time: 07/12/16  2:37 PM  Result Value Ref Range Status   Specimen Description BLOOD RIGHT ANTECUBITAL  Final   Special Requests BOTTLES DRAWN AEROBIC AND ANAEROBIC 5CC  Final   Culture  Setup Time   Final    GRAM NEGATIVE RODS IN BOTH AEROBIC AND ANAEROBIC BOTTLES Organism ID to follow CRITICAL RESULT CALLED TO, READ BACK BY AND VERIFIED WITH: GREG ABBOTT,PHARMD @0717  07/13/16 MKELLY,MLT  Culture ESCHERICHIA COLI SUSCEPTIBILITIES TO FOLLOW  (A)  Final   Report Status PENDING  Incomplete  Blood Culture ID Panel (Reflexed)     Status: Abnormal   Collection Time: 07/12/16  2:37 PM  Result Value Ref Range Status   Enterococcus species NOT DETECTED NOT DETECTED Final   Listeria monocytogenes NOT DETECTED NOT DETECTED Final   Staphylococcus species NOT DETECTED NOT DETECTED Final   Staphylococcus aureus NOT DETECTED NOT DETECTED Final   Streptococcus species NOT DETECTED NOT DETECTED Final   Streptococcus agalactiae NOT DETECTED NOT DETECTED Final   Streptococcus pneumoniae NOT DETECTED NOT DETECTED Final   Streptococcus pyogenes NOT DETECTED NOT DETECTED Final   Acinetobacter baumannii NOT DETECTED NOT DETECTED Final   Enterobacteriaceae species DETECTED (A) NOT DETECTED Final    Comment: CRITICAL RESULT CALLED TO, READ BACK BY AND VERIFIED WITH: GREG ABBOTT, PHARMD @0717  07/13/16 MKELLY,MLT    Enterobacter cloacae complex NOT DETECTED NOT DETECTED Final   Escherichia coli DETECTED (A) NOT DETECTED Final    Comment: CRITICAL RESULT CALLED TO, READ BACK BY AND VERIFIED WITH: GREG ABBOTT, PHARMD @0717  07/13/16 MKELLY,MLT    Klebsiella oxytoca NOT DETECTED NOT DETECTED Final    Klebsiella pneumoniae NOT DETECTED NOT DETECTED Final   Proteus species NOT DETECTED NOT DETECTED Final   Serratia marcescens NOT DETECTED NOT DETECTED Final   Carbapenem resistance NOT DETECTED NOT DETECTED Final   Haemophilus influenzae NOT DETECTED NOT DETECTED Final   Neisseria meningitidis NOT DETECTED NOT DETECTED Final   Pseudomonas aeruginosa NOT DETECTED NOT DETECTED Final   Candida albicans NOT DETECTED NOT DETECTED Final   Candida glabrata NOT DETECTED NOT DETECTED Final   Candida krusei NOT DETECTED NOT DETECTED Final   Candida parapsilosis NOT DETECTED NOT DETECTED Final   Candida tropicalis NOT DETECTED NOT DETECTED Final  Culture, blood (Routine x 2)     Status: None (Preliminary result)   Collection Time: 07/12/16  3:12 PM  Result Value Ref Range Status   Specimen Description BLOOD RIGHT FOREARM  Final   Special Requests BOTTLES DRAWN AEROBIC AND ANAEROBIC 5CC  Final   Culture  Setup Time   Final    GRAM NEGATIVE RODS IN BOTH AEROBIC AND ANAEROBIC BOTTLES CRITICAL VALUE NOTED.  VALUE IS CONSISTENT WITH PREVIOUSLY REPORTED AND CALLED VALUE.    Culture GRAM NEGATIVE RODS  Final   Report Status PENDING  Incomplete  Urine culture     Status: Abnormal   Collection Time: 07/12/16  3:21 PM  Result Value Ref Range Status   Specimen Description URINE, CLEAN CATCH  Final   Special Requests NONE  Final   Culture >=100,000 COLONIES/mL ESCHERICHIA COLI (A)  Final   Report Status 07/14/2016 FINAL  Final   Organism ID, Bacteria ESCHERICHIA COLI (A)  Final      Susceptibility   Escherichia coli - MIC*    AMPICILLIN <=2 SENSITIVE Sensitive     CEFAZOLIN <=4 SENSITIVE Sensitive     CEFTRIAXONE <=1 SENSITIVE Sensitive     CIPROFLOXACIN <=0.25 SENSITIVE Sensitive     GENTAMICIN <=1 SENSITIVE Sensitive     IMIPENEM <=0.25 SENSITIVE Sensitive     NITROFURANTOIN <=16 SENSITIVE Sensitive     TRIMETH/SULFA <=20 SENSITIVE Sensitive     AMPICILLIN/SULBACTAM <=2 SENSITIVE Sensitive      PIP/TAZO <=4 SENSITIVE Sensitive     Extended ESBL NEGATIVE Sensitive     * >=100,000 COLONIES/mL ESCHERICHIA COLI  MRSA PCR Screening     Status: None   Collection  Time: 07/12/16  6:53 PM  Result Value Ref Range Status   MRSA by PCR NEGATIVE NEGATIVE Final    Comment:        The GeneXpert MRSA Assay (FDA approved for NASAL specimens only), is one component of a comprehensive MRSA colonization surveillance program. It is not intended to diagnose MRSA infection nor to guide or monitor treatment for MRSA infections.   Culture, sputum-assessment     Status: None   Collection Time: 07/13/16  6:29 AM  Result Value Ref Range Status   Specimen Description EXPECTORATED SPUTUM  Final   Special Requests NONE  Final   Sputum evaluation   Final    Sputum specimen not acceptable for testing.  Please recollect.   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  COPD Diastolic heart dz Cad OSA on cpap CKD KNOWN moderate PA HTN Enlarging hepatic cysts on CT abdomen, ? Significance.   #current UTI Severe sepsis (high PCT) without evidence shock due to E colli AKI - improving Acute hypoxemic resp fialure - tenuous but responding to diuresis and asking fr bipap  RECS:  -  continue ceftriaxone for now -  may need further GI eval of her hepatic cysts.-  - hold off  plaquanil during the acute illness  - agree with CAREFUL diuresis now. She will not tolerate high preload. Follow BP closely. Cards managing this - BiPAP qhs + scheduled on/off in day - keep in ICU .  - appreciate DR Bensimhon's input  pccm primary. No family at bedside 07/14/2016    The patient is critically ill with multiple organ systems failure and requires high complexity decision making for assessment and support, frequent evaluation and titration of  therapies, application of advanced monitoring technologies and extensive interpretation of multiple databases.   Critical Care Time devoted to patient care services described in this note is  30  Minutes. This time reflects time of care of this signee Dr Brand Males. This critical care time does not reflect procedure time, or teaching time or supervisory time of PA/NP/Med student/Med Resident etc but could involve care discussion time    Dr. Brand Males, M.D., [facility].C.P Pulmonary and Critical Care Medicine Staff Physician [place] [facility] Pager: [phone], If no answer or between  15:00h - 7:00h: call [phone]  07/14/2016 11:54 AM        Dr. Brand Males, M.D., [facility].C.P Pulmonary and Critical Care Medicine Staff Physician [place] [facility] Pager: [phone], If no answer or between  15:00h - 7:00h: call [phone]  07/14/2016 11:44 AM

## 2016-07-14 NOTE — Progress Notes (Signed)
Pt requesting to be placed on bipap. Pt states she has been unable to catch her breath all morning. O2 sats currently 98% on 6L. Pt has bipap QHS orders only. Dr. Allyson Sabal text paged. Will continue to monitor. Roselyn Reef Travor Royce,RN

## 2016-07-15 LAB — CULTURE, BLOOD (ROUTINE X 2)

## 2016-07-15 LAB — PHOSPHORUS: Phosphorus: 4.3 mg/dL (ref 2.5–4.6)

## 2016-07-15 LAB — MAGNESIUM: Magnesium: 2.1 mg/dL (ref 1.7–2.4)

## 2016-07-15 NOTE — Progress Notes (Signed)
Pt off BIPAP on 6LNC. No obvious respiratory distress noted at this time. RT will continue to monitor.

## 2016-07-15 NOTE — Progress Notes (Signed)
Advanced Heart Failure Rounding Note   Subjective:     70 y.o. 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   Admitted with ecoli urosespis. WBC 17.5. Procalcitonin 40.47.Transferred to ICU on 12/8 for respiratory failure and volume overload.   Continues to diurese now back to baseline weight (207 pounds at recent clinic visit). Says she is still SOB but looks more comfortable. Wants Bipap for comfort when needed,   Creatinine improved.   Objective:   Weight Range:  Vital Signs:   Temp:  [97.8 F (36.6 C)-98.5 F (36.9 C)] 98.2 F (36.8 C) (12/10 0751) Pulse Rate:  [72-95] 84 (12/10 1154) Resp:  [19-37] 24 (12/10 1154) BP: (73-112)/(52-76) 95/76 (12/10 1154) SpO2:  [92 %-100 %] 99 % (12/10 1154) Weight:  [94.1 kg (207 lb 7.3 oz)] 94.1 kg (207 lb 7.3 oz) (12/10 0751) Last BM Date: 07/14/16  Weight change: Filed Weights   07/13/16 1904 07/14/16 0415 07/15/16 0751  Weight: 96.6 kg (212 lb 15.4 oz) 96.3 kg (212 lb 4.9 oz) 94.1 kg (207 lb 7.3 oz)    Intake/Output:   Intake/Output Summary (Last 24 hours) at 07/15/16 1214 Last data filed at 07/15/16 0939  Gross per 24 hour  Intake              290 ml  Output             1000 ml  Net             -710 ml     Physical Exam: General:   Lying in bed on 6L [place]  Resp status ok  HEENT: Normal Neck: supple. JVP hard to see. Appears still up a bit. Carotids 2+ bilat; no bruits. No lymphadenopathy or thyromegaly appreciated. Cor: PMI nondisplaced. Irregular. . 3/6 MR.  Lungs: Diminished throughout. Clear anteriorly  Abdomen: soft, NT, ND, no HSM. No bruits or masses. +BS  Extremities: no cyanosis, clubbing, rash. Tr-1+ edema.  Neuro: alert & orientedx3, cranial nerves grossly intact. moves all 4 extremities w/o difficulty. Affect flat.  Telemetry: Reviewed personally, Afib 80-90s  Labs: Basic Metabolic Panel:  Recent  Labs Lab 07/09/16 1000  07/12/16 1446 07/13/16 0446 07/13/16 1650 07/14/16 0210 07/15/16 0241  NA 144  --  137 137 135 136  --   K 4.2  --  4.5 4.6 4.7 4.5  --   CL  --   --  103 104 102 103  --   CO2 30*  --  27 24 20* 25  --   GLUCOSE 118  --  116* 96 85 127*  --   BUN 39.0*  --  46* 48* 46* 53*  --   CREATININE 2.0*  --  2.94* 2.99* 2.89* 2.76*  --   CALCIUM 10.8*  < > 10.1 10.1 10.2 9.8  --   MG  --   --   --  1.9  --   --  2.1  PHOS  --   --   --   --   --   --  4.3  < > = values in this interval not displayed.  Liver Function Tests:  Recent Labs Lab 07/09/16 1000 07/12/16 1446 07/13/16 0446 07/14/16 0210  AST 16 20 18 19   ALT 12 14 15 15   ALKPHOS 127 107 88 74  BILITOT 0.59 0.9 1.1 0.6  PROT 7.6 7.3 7.1 6.7  ALBUMIN 3.2* 3.1* 2.7* 2.6*    Recent Labs Lab 07/12/16 1446 07/13/16 1650  LIPASE 15 14   No results for input(s): AMMONIA in the last 168 hours.  CBC:  Recent Labs Lab 07/09/16 1000 07/12/16 1446 07/12/16 1933 07/13/16 0446 07/14/16 0210  WBC 5.9 17.5* 21.3* 20.2* 12.8*  NEUTROABS 4.6 15.6* 19.5* 18.6*  --   HGB 11.4* 11.4* 10.4* 10.8* 10.4*  HCT 37.9 37.4 33.9* 35.7* 34.2*  MCV 98.4 96.9 98.0 98.1 97.2  PLT 88* 68* 75* 73* 59*    Cardiac Enzymes:  Recent Labs Lab 07/13/16 0646 07/13/16 1204 07/13/16 1752  TROPONINI 0.05* 0.05* 0.06*    BNP: BNP (last 3 results)  Recent Labs  04/02/16 0909 04/11/16 0922 07/12/16 1447  BNP 317.2* 180.4* 318.6*    ProBNP (last 3 results) No results for input(s): PROBNP in the last 8760 hours.    Other results:  Imaging: Ct Abdomen Pelvis Wo Contrast  Result Date: 07/13/2016 CLINICAL DATA:  Sepsis, diarrhea EXAM: CT ABDOMEN AND PELVIS WITHOUT CONTRAST TECHNIQUE: Multidetector CT imaging of the abdomen and pelvis was performed following the standard protocol without IV contrast. COMPARISON:  CT chest 05/06/2013 FINDINGS: Lower chest: There is dilatation of descending aorta up to 4.1  cm stable from prior exam. There is small right pleural effusion with right base posterior atelectasis. Mild bronchitic changes are noted bilateral lower lobe. Cardiomegaly is noted. Hepatobiliary: Study is limited without IV contrast. Unenhanced liver shows no biliary ductal dilatation. Scattered hepatic cysts are again noted with progression in size from prior exam. The largest cyst in anterior aspect of the right hepatic dome measures 4 cm. Largest cyst in lateral segment of left hepatic lobe measures 3.4 cm. No calcified gallstones are noted within gallbladder. There is micro nodular liver contour. Chronic liver disease or cirrhosis cannot be excluded. Pancreas: No focal abnormality is noted in unenhanced pancreas. Axial image 33 there is mild stranding of the fat surrounding the pancreatic head region. There is mild stranding of the fat inferior to pancreatic head region and just above the distal duodenum please see coronal image 66. Mild focal pancreatitis or duodenal inflammation cannot be excluded. Clinical correlation is necessary. Spleen: Unenhanced spleen is normal. Adrenals/Urinary Tract: Again noted mild thickening of left adrenal gland. The right adrenal gland is stable. There is a cyst in midpole posterior aspect of the left kidney measures 4.1 cm. There is cortical scarring midpole of the right kidney. A cyst in lower pole of the right kidney measures 4.4 cm. No hydronephrosis or hydroureter. No nephrolithiasis. No calcified calculi are noted within under distended urinary bladder. There is nonobstructive calcified calculus in lower pole of the left kidney measures 4 mm. Stomach/Bowel: Oral contrast material was given to the patient. There is no evidence of small bowel obstruction. No thickened or dilated small bowel loops are noted. There is no pericecal inflammation. Normal appendix is partially visualized in axial image 51. Some colonic gas noted in transverse colon. Multiple descending colon  diverticula. The descending colon and sigmoid colon are empty collapsed. Multiple colonic diverticula are noted sigmoid colon. There is redundant sigmoid colon. No evidence of acute pancreatitis. No distal colitis. Some liquid stool noted within rectum. Diarrhea cannot be excluded. Vascular/Lymphatic: No aortic aneurysm. Atherosclerotic calcifications of abdominal aorta and iliac arteries. Reproductive: The uterus is atrophic. There is a cyst/follicle within right ovary measures 2 cm. Other: No free abdominal air.  No large abdominal ascites. Musculoskeletal: Sagittal images of the spine shows degenerative  changes thoracolumbar spine. IMPRESSION: 1. Cardiomegaly is noted. Again noted aneurysmal dilatation of descending aorta stable from prior exam. Small right pleural effusion with right base posterior medially atelectasis. 2. Scattered hepatic cysts are noted progression in size from prior exam. 3. Bilateral renal cysts.  Left nonobstructive nephrolithiasis. 4. Micro nodular liver contour. Chronic liver disease or cirrhosis cannot be excluded. 5. There is subtle mild stranding of the fat surrounding the pancreatic head region in medial and inferior to duodenum. Mild focal pancreatitis or duodenal inflammation cannot be excluded. Clinical correlation is necessary. Study is limited without IV contrast. 6. No small bowel or colonic obstruction. No pericecal inflammation. Normal appendix. 7. Distal colonic diverticula. No evidence of acute colitis or diverticulitis. 8. There is a dominant follicle/cyst within right ovary measures 2 cm. Electronically Signed   By: Lahoma Crocker M.D.   On: 07/13/2016 13:43   Dg Chest Port 1v Same Day  Result Date: 07/14/2016 CLINICAL DATA:  Respiratory failure with hypoxia EXAM: PORTABLE CHEST 1 VIEW COMPARISON:  07/13/2016 FINDINGS: Sternotomy wires unchanged. Lungs are adequately inflated demonstrate patchy bilateral hilar opacification which may be due to mild interstitial edema,  although cannot exclude infection. No evidence of effusion. Stable cardiomegaly. Remainder of the exam is unchanged. IMPRESSION: Patchy perihilar opacification which may be due to mild interstitial edema versus infection. Stable cardiomegaly. Electronically Signed   By: Marin Olp M.D.   On: 07/14/2016 13:29     Medications:     Scheduled Medications: . atorvastatin  10 mg Oral Daily  . cefTRIAXone (ROCEPHIN)  IV  2 g Intravenous Q24H  . furosemide  80 mg Intravenous BID  . neomycin-polymyxin b-dexamethasone  1 application Both Eyes QHS  . omega-3 acid ethyl esters  1 g Oral Daily  . polyvinyl alcohol  1 drop Both Eyes QHS  . sodium chloride flush  3 mL Intravenous Q12H    Infusions:   PRN Medications: albuterol, metoprolol, ondansetron **OR** ondansetron (ZOFRAN) IV   Assessment:   1. Sepsis 2/2 E-Coli Bacteremia due to UTI - Continue Ceftriaxone per primary.  Got Vanc/Zosyn on admission.  - UCx and BCx both positive for E-Coli - CT ABD/Pelvis with scattered hepatic cysts (increased in size from prior exam), bilateral renal cysts, ?inflamatory changes around head of pancreas, Normal appendix, no acute colitis or diverticulitis.  - CCM following. 2. Acute on Chronic Diastolic CHF - Volume status much improved. Weight now back to baseline. Still dyspneic however and has mild edema.  - Will continue IV lasix one more day then cosnider switch back to home torsemide regimen tomorrow 3. Acute on chronic hypoxic respiratory failure - Much improved after diuresis.  - On chronic 02 4-5L at home.   - She is requesting BIPAP for comfort. Will continue for now.  - Diuretics as above - CCM following as above. Being transferred to [facility],  4. ARF on CKD stage III - Improving. Baseline creatinine 2.0-2.2 5. MR - Mild to Moderate MR by Echo 1/17 - Repeat Echo 12/9 EF 60% mild MR RVSP 110mmHG 6. Atrial Fib, Persistent - Rate back down with treatment of sepsis - Not AC candidate with  recurrent severe GI bleeding.  7. PAH - primarily [place] group 2 based on RHC 3/17 - Likely contributor to her dyspnea, but poor candidate for pulmonary vasodilators.  Doing well. Switch back to po diuretics. Can go to SDU.    Length of Stay: 3   Bensimhon, Daniel MD 07/15/2016, 12:14 PM  Advanced Heart Failure  Team Pager [phone] (M-F; [place])  Please contact [facility] for night-coverage after hours (4p -7a ) and weekends on [email or web address]

## 2016-07-15 NOTE — Progress Notes (Signed)
Name: April Mueller MRN: [id number] DOB: 27-Sep-1945    ADMISSION DATE:  07/12/2016 CONSULTATION DATE:  12/7  REFERRING MD :  DR Cathleen Fears  CHIEF COMPLAINT:  hypotension  BRIEF PATIENT DESCRIPTION: 70 yr old PA htn, presents fevers, sepsis  brief: Very Pleasant 70 y.o. female with Past medical history of CKD 3, COPD ( WERT, [place]), chronic respiratory failure, pulmonary hypertension group 2. Also h/o MR with rejected Mitral valve clip procedure by [facility]. To note July visit for unclear anemia, GI bleed work up neg overall. Patient presented with complaints of fever and chills as well as nausea. Patient reported poor appetite for last 2 days. She had chills,no diarhea, no recent abx. She presented ot ED, UA pos, had some bloating lower abdomen. Noted JVF by EDP and pcxr with int prominence after fluid given. Asked ot evaluate for ICU admission and for concerns volume status with sepsis.   STUDIES. event 12/7 renal US>>> no hydronephrosis.Blood culture with e colii and urine culture as well 12/8 CT abdomen >> cardiomegaly, scattered hepatic cysts larger than prior, micronodular liver, pancreatic fat stranding, no bowel or appendix abnormality. "I feel terrible, weak" Has been hemodynamically stable but feels worse, more SOB. Note significantly above her usual dry wt.  Blood cx positive as above   07/14/16 - d/w dR Bensimohn at bedside - per him patient looks much better than yesterday and he plans for more diuresis on ICU side. Per Patient - still not feeling better. On 4-5L [place]. RR 31 and she is asking for bipap. AKI some better    SUBJECTIVE/OVERNIGHT/INTERVAL HX 12/10 -patient feeling much better. Dyspnea less. But still feels she needs day time bipap on/off and qhs mandatory. She wants to sit. On 6L [place]. Not on pressors. Afebrile x 24h. wEigh down but volume up  VITAL SIGNS: Temp:  [97.8 F (36.6 C)-98.5 F (36.9 C)] 98.2 F (36.8 C) (12/10 0751) Pulse Rate:  [72-90] 87 (12/10  0751) Resp:  [19-37] 19 (12/10 0751) BP: (73-112)/(52-75) 104/64 (12/10 0751) SpO2:  [92 %-100 %] 96 % (12/10 0751) Weight:  [94.1 kg (207 lb 7.3 oz)] 94.1 kg (207 lb 7.3 oz) (12/10 0751)  PHYSICAL EXAMINATION: General:  Ill appearing, in mild resp distress Neuro:  More awake, , globally weak but non-focal HEENT:  jvd noted elevated Cardiovascular:  s1 s2 rrr ditsant, murmur 4/6 best mid, no radiation Lungs:  CTA. RR 31 but not paradoxical Abdomen:  Soft, BS wnl, no r/g, nontender Musculoskeletal:  No edema Skin:  No rash  PULMONARY  Recent Labs Lab 07/13/16 1641  PHART 7.303*  PCO2ART 47.4  PO2ART 70.1*  HCO3 22.6  O2SAT 90.8    CBC  Recent Labs Lab 07/12/16 1933 07/13/16 0446 07/14/16 0210  HGB 10.4* 10.8* 10.4*  HCT 33.9* 35.7* 34.2*  WBC 21.3* 20.2* 12.8*  PLT 75* 73* 59*    COAGULATION  Recent Labs Lab 07/12/16 1933 07/14/16 0210  INR 1.41 1.26    CARDIAC    Recent Labs Lab 07/13/16 0646 07/13/16 1204 07/13/16 1752  TROPONINI 0.05* 0.05* 0.06*   No results for input(s): PROBNP in the last 168 hours.   CHEMISTRY  Recent Labs Lab 07/09/16 1000  07/12/16 1446 07/13/16 0446 07/13/16 1650 07/14/16 0210 07/15/16 0241  NA 144  --  137 137 135 136  --   K 4.2  < > 4.5 4.6 4.7 4.5  --   CL  --   --  103 104 102 103  --  CO2 30*  --  27 24 20* 25  --   GLUCOSE 118  --  116* 96 85 127*  --   BUN 39.0*  --  46* 48* 46* 53*  --   CREATININE 2.0*  --  2.94* 2.99* 2.89* 2.76*  --   CALCIUM 10.8*  --  10.1 10.1 10.2 9.8  --   MG  --   --   --  1.9  --   --  2.1  PHOS  --   --   --   --   --   --  4.3  < > = values in this interval not displayed. Estimated Creatinine Clearance: 21.7 mL/min (by C-G formula based on SCr of 2.76 mg/dL (H)).   LIVER  Recent Labs Lab 07/09/16 1000 07/12/16 1446 07/12/16 1933 07/13/16 0446 07/14/16 0210  AST 16 20  --  18 19  ALT 12 14  --  15 15  ALKPHOS 127 107  --  88 74  BILITOT 0.59 0.9  --  1.1  0.6  PROT 7.6 7.3  --  7.1 6.7  ALBUMIN 3.2* 3.1*  --  2.7* 2.6*  INR  --   --  1.41  --  1.26     INFECTIOUS  Recent Labs Lab 07/12/16 1447 07/12/16 1716 07/12/16 1933 07/13/16 1650  LATICACIDVEN 1.96* 1.23  --  1.7  PROCALCITON  --   --  40.47  --      ENDOCRINE CBG (last 3)  No results for input(s): GLUCAP in the last 72 hours.       IMAGING x48h  - image(s) personally visualized  -   highlighted in bold Ct Abdomen Pelvis Wo Contrast  Result Date: 07/13/2016 CLINICAL DATA:  Sepsis, diarrhea EXAM: CT ABDOMEN AND PELVIS WITHOUT CONTRAST TECHNIQUE: Multidetector CT imaging of the abdomen and pelvis was performed following the standard protocol without IV contrast. COMPARISON:  CT chest 05/06/2013 FINDINGS: Lower chest: There is dilatation of descending aorta up to 4.1 cm stable from prior exam. There is small right pleural effusion with right base posterior atelectasis. Mild bronchitic changes are noted bilateral lower lobe. Cardiomegaly is noted. Hepatobiliary: Study is limited without IV contrast. Unenhanced liver shows no biliary ductal dilatation. Scattered hepatic cysts are again noted with progression in size from prior exam. The largest cyst in anterior aspect of the right hepatic dome measures 4 cm. Largest cyst in lateral segment of left hepatic lobe measures 3.4 cm. No calcified gallstones are noted within gallbladder. There is micro nodular liver contour. Chronic liver disease or cirrhosis cannot be excluded. Pancreas: No focal abnormality is noted in unenhanced pancreas. Axial image 33 there is mild stranding of the fat surrounding the pancreatic head region. There is mild stranding of the fat inferior to pancreatic head region and just above the distal duodenum please see coronal image 66. Mild focal pancreatitis or duodenal inflammation cannot be excluded. Clinical correlation is necessary. Spleen: Unenhanced spleen is normal. Adrenals/Urinary Tract: Again noted mild  thickening of left adrenal gland. The right adrenal gland is stable. There is a cyst in midpole posterior aspect of the left kidney measures 4.1 cm. There is cortical scarring midpole of the right kidney. A cyst in lower pole of the right kidney measures 4.4 cm. No hydronephrosis or hydroureter. No nephrolithiasis. No calcified calculi are noted within under distended urinary bladder. There is nonobstructive calcified calculus in lower pole of the left kidney measures 4 mm. Stomach/Bowel: Oral contrast material  was given to the patient. There is no evidence of small bowel obstruction. No thickened or dilated small bowel loops are noted. There is no pericecal inflammation. Normal appendix is partially visualized in axial image 51. Some colonic gas noted in transverse colon. Multiple descending colon diverticula. The descending colon and sigmoid colon are empty collapsed. Multiple colonic diverticula are noted sigmoid colon. There is redundant sigmoid colon. No evidence of acute pancreatitis. No distal colitis. Some liquid stool noted within rectum. Diarrhea cannot be excluded. Vascular/Lymphatic: No aortic aneurysm. Atherosclerotic calcifications of abdominal aorta and iliac arteries. Reproductive: The uterus is atrophic. There is a cyst/follicle within right ovary measures 2 cm. Other: No free abdominal air.  No large abdominal ascites. Musculoskeletal: Sagittal images of the spine shows degenerative changes thoracolumbar spine. IMPRESSION: 1. Cardiomegaly is noted. Again noted aneurysmal dilatation of descending aorta stable from prior exam. Small right pleural effusion with right base posterior medially atelectasis. 2. Scattered hepatic cysts are noted progression in size from prior exam. 3. Bilateral renal cysts.  Left nonobstructive nephrolithiasis. 4. Micro nodular liver contour. Chronic liver disease or cirrhosis cannot be excluded. 5. There is subtle mild stranding of the fat surrounding the pancreatic head  region in medial and inferior to duodenum. Mild focal pancreatitis or duodenal inflammation cannot be excluded. Clinical correlation is necessary. Study is limited without IV contrast. 6. No small bowel or colonic obstruction. No pericecal inflammation. Normal appendix. 7. Distal colonic diverticula. No evidence of acute colitis or diverticulitis. 8. There is a dominant follicle/cyst within right ovary measures 2 cm. Electronically Signed   By: Lahoma Crocker M.D.   On: 07/13/2016 13:43   Dg Chest Port 1v Same Day  Result Date: 07/14/2016 CLINICAL DATA:  Respiratory failure with hypoxia EXAM: PORTABLE CHEST 1 VIEW COMPARISON:  07/13/2016 FINDINGS: Sternotomy wires unchanged. Lungs are adequately inflated demonstrate patchy bilateral hilar opacification which may be due to mild interstitial edema, although cannot exclude infection. No evidence of effusion. Stable cardiomegaly. Remainder of the exam is unchanged. IMPRESSION: Patchy perihilar opacification which may be due to mild interstitial edema versus infection. Stable cardiomegaly. Electronically Signed   By: Marin Olp M.D.   On: 07/14/2016 13:29     Cultures:  Blood 12/7 - e colii Urine 12/10 - e colii    ASSESSMENT / PLAN:  #baseline RA on Plaquanil. Immunosuppressed.  COPD Diastolic heart dz Cad OSA on cpap CKD KNOWN moderate PA HTN Enlarging hepatic cysts on CT abdomen, ? Significance.   #current UTI with Severe sepsis (high PCT) without evidence shock due to E colli AKI - improving Acute hypoxemic resp fialure - tenuous but responding to diuresis and asking fr bipap - improved 12/10 but still asking for on/off bipap in day  RECS:  -  continue ceftriaxone for now -  may need further GI eval of her hepatic cysts.-  - hold off  plaquanil during the acute illness  - agree with CAREFUL diuresis now. She will not tolerate high preload. Follow BP closely. Cards managing this - BiPAP qhs + scheduled on/off in day to contiue -  keep in ICU .  - appreciate DR Bensimhon's input  pccm primary. No family at bedside 07/15/2016  Move to sdu . With THR primary again and pccm off from 07/16/16 - d/w mcclung   Dr. Brand Males, M.D., [facility].C.P Pulmonary and Critical Care Medicine Staff Physician [facility] Pager: [phone], If no answer or between  15:00h -  7:00h: call [phone]  07/15/2016 11:40 AM

## 2016-07-16 DIAGNOSIS — N183 Chronic kidney disease, stage 3 (moderate): Secondary | ICD-10-CM

## 2016-07-16 DIAGNOSIS — D469 Myelodysplastic syndrome, unspecified: Secondary | ICD-10-CM

## 2016-07-16 DIAGNOSIS — I10 Essential (primary) hypertension: Secondary | ICD-10-CM

## 2016-07-16 LAB — BASIC METABOLIC PANEL
Anion gap: 10 (ref 5–15)
BUN: 68 mg/dL — AB (ref 6–20)
CALCIUM: 9.3 mg/dL (ref 8.9–10.3)
CO2: 30 mmol/L (ref 22–32)
CREATININE: 2.43 mg/dL — AB (ref 0.44–1.00)
Chloride: 99 mmol/L — ABNORMAL LOW (ref 101–111)
GFR calc Af Amer: 22 mL/min — ABNORMAL LOW (ref >60)
GFR, EST NON AFRICAN AMERICAN: 19 mL/min — AB (ref >60)
Glucose, Bld: 100 mg/dL — ABNORMAL HIGH (ref 65–99)
Potassium: 3.1 mmol/L — ABNORMAL LOW (ref 3.5–5.1)
SODIUM: 139 mmol/L (ref 135–145)

## 2016-07-16 LAB — CBC WITH DIFFERENTIAL/PLATELET
BASOS PCT: 0 %
Basophils Absolute: 0 10*3/uL (ref 0.0–0.1)
EOS ABS: 0.2 10*3/uL (ref 0.0–0.7)
Eosinophils Relative: 2 %
HCT: 34.7 % — ABNORMAL LOW (ref 36.0–46.0)
Hemoglobin: 10.6 g/dL — ABNORMAL LOW (ref 12.0–15.0)
LYMPHS ABS: 0.9 10*3/uL (ref 0.7–4.0)
Lymphocytes Relative: 9 %
MCH: 29.4 pg (ref 26.0–34.0)
MCHC: 30.5 g/dL (ref 30.0–36.0)
MCV: 96.1 fL (ref 78.0–100.0)
MONO ABS: 0.9 10*3/uL (ref 0.1–1.0)
Monocytes Relative: 9 %
NEUTROS ABS: 8.4 10*3/uL — AB (ref 1.7–7.7)
NEUTROS PCT: 80 %
PLATELETS: 75 10*3/uL — AB (ref 150–400)
RBC: 3.61 MIL/uL — ABNORMAL LOW (ref 3.87–5.11)
RDW: 16.8 % — AB (ref 11.5–15.5)
WBC: 10.4 10*3/uL (ref 4.0–10.5)

## 2016-07-16 LAB — MAGNESIUM: MAGNESIUM: 2.1 mg/dL (ref 1.7–2.4)

## 2016-07-16 LAB — PHOSPHORUS: Phosphorus: 3.1 mg/dL (ref 2.5–4.6)

## 2016-07-16 MED ORDER — ACETAMINOPHEN 325 MG PO TABS
650.0000 mg | ORAL_TABLET | Freq: Once | ORAL | Status: AC
  Administered 2016-07-16: 650 mg via ORAL
  Filled 2016-07-16: qty 2

## 2016-07-16 MED ORDER — ACETAMINOPHEN 325 MG PO TABS
650.0000 mg | ORAL_TABLET | Freq: Four times a day (QID) | ORAL | Status: DC | PRN
  Administered 2016-07-18 – 2016-07-20 (×2): 650 mg via ORAL
  Filled 2016-07-16 (×2): qty 2

## 2016-07-16 MED ORDER — POTASSIUM CHLORIDE CRYS ER 20 MEQ PO TBCR
40.0000 meq | EXTENDED_RELEASE_TABLET | Freq: Two times a day (BID) | ORAL | Status: AC
  Administered 2016-07-16 (×2): 40 meq via ORAL
  Filled 2016-07-16 (×2): qty 2

## 2016-07-16 NOTE — Progress Notes (Signed)
Pt transferred to chair, minimal assist. Pt requesting bipap at this time, mild SOB and desaturation. Bipap placed at this time. [place] 4L sats 86%, placed on Bipap 40%- sats 96%. RN will continue to monitor.

## 2016-07-16 NOTE — Progress Notes (Signed)
Advanced Heart Failure Rounding Note   Subjective:     70 y.o. 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   Admitted with ecoli urosespis. WBC 17.5. Procalcitonin 40.47.Transferred to ICU on 12/8 for respiratory failure and volume overload.   Continues to diurese with IV lasix. Weight up 4 pounds. Unable to stand on scale.   Feeling better today.   Objective:   Weight Range:  Vital Signs:   Temp:  [98 F (36.7 C)-99.6 F (37.6 C)] 98.5 F (36.9 C) (12/11 0400) Pulse Rate:  [78-95] 78 (12/11 0700) Resp:  [15-33] 21 (12/11 0700) BP: (86-128)/(58-96) 92/64 (12/11 0700) SpO2:  [95 %-100 %] 96 % (12/11 0700) Weight:  [201 lb 15.1 oz (91.6 kg)-207 lb 7.3 oz (94.1 kg)] 205 lb 14.6 oz (93.4 kg) (12/11 0400) Last BM Date: 07/14/16  Weight change: Filed Weights   07/15/16 0751 07/16/16 0000 07/16/16 0400  Weight: 207 lb 7.3 oz (94.1 kg) (S) 201 lb 15.1 oz (91.6 kg) 205 lb 14.6 oz (93.4 kg)    Intake/Output:   Intake/Output Summary (Last 24 hours) at 07/16/16 0731 Last data filed at 07/16/16 0500  Gross per 24 hour  Intake              920 ml  Output                0 ml  Net              920 ml     Physical Exam: General:   Lying in bed on 6L [place]  Resp status ok  HEENT: Normal Neck: supple. JVP 12 cm.  Carotids 2+ bilat; no bruits. No lymphadenopathy or thyromegaly appreciated. Cor: PMI nondisplaced. Irregular. . 3/6 MR.  Lungs: Diminished throughout. Clear anteriorly  Abdomen: soft, NT, ND, no HSM. No bruits or masses. +BS  Extremities: no cyanosis, clubbing, rash. No edema. .  Neuro: alert & orientedx3, cranial nerves grossly intact. moves all 4 extremities w/o difficulty. Affect flat.  Telemetry: Reviewed personally, Afib 80-90s  Labs: Basic Metabolic Panel:  Recent Labs Lab 07/12/16 1446 07/13/16 0446 07/13/16 1650 07/14/16 0210 07/15/16 0241  07/16/16 0440  NA 137 137 135 136  --  139  K 4.5 4.6 4.7 4.5  --  3.1*  CL 103 104 102 103  --  99*  CO2 27 24 20* 25  --  30  GLUCOSE 116* 96 85 127*  --  100*  BUN 46* 48* 46* 53*  --  68*  CREATININE 2.94* 2.99* 2.89* 2.76*  --  2.43*  CALCIUM 10.1 10.1 10.2 9.8  --  9.3  MG  --  1.9  --   --  2.1 2.1  PHOS  --   --   --   --  4.3 3.1    Liver Function Tests:  Recent Labs Lab 07/09/16 1000 07/12/16 1446 07/13/16 0446 07/14/16 0210  AST 16 20 18 19   ALT 12 14 15 15   ALKPHOS 127 107 88 74  BILITOT 0.59 0.9 1.1 0.6  PROT 7.6 7.3 7.1 6.7  ALBUMIN 3.2* 3.1* 2.7* 2.6*    Recent Labs Lab 07/12/16 1446 07/13/16 1650  LIPASE 15 14   No results for input(s): AMMONIA in the last 168 hours.  CBC:  Recent Labs Lab 07/09/16 1000 07/12/16 1446 07/12/16 1933 07/13/16 0446 07/14/16 0210 07/16/16 0440  WBC 5.9  17.5* 21.3* 20.2* 12.8* 10.4  NEUTROABS 4.6 15.6* 19.5* 18.6*  --  8.4*  HGB 11.4* 11.4* 10.4* 10.8* 10.4* 10.6*  HCT 37.9 37.4 33.9* 35.7* 34.2* 34.7*  MCV 98.4 96.9 98.0 98.1 97.2 96.1  PLT 88* 68* 75* 73* 59* 75*    Cardiac Enzymes:  Recent Labs Lab 07/13/16 0646 07/13/16 1204 07/13/16 1752  TROPONINI 0.05* 0.05* 0.06*    BNP: BNP (last 3 results)  Recent Labs  04/02/16 0909 04/11/16 0922 07/12/16 1447  BNP 317.2* 180.4* 318.6*    ProBNP (last 3 results) No results for input(s): PROBNP in the last 8760 hours.    Other results:  Imaging: Dg Chest Port 1v Same Day  Result Date: 07/14/2016 CLINICAL DATA:  Respiratory failure with hypoxia EXAM: PORTABLE CHEST 1 VIEW COMPARISON:  07/13/2016 FINDINGS: Sternotomy wires unchanged. Lungs are adequately inflated demonstrate patchy bilateral hilar opacification which may be due to mild interstitial edema, although cannot exclude infection. No evidence of effusion. Stable cardiomegaly. Remainder of the exam is unchanged. IMPRESSION: Patchy perihilar opacification which may be due to mild  interstitial edema versus infection. Stable cardiomegaly. Electronically Signed   By: Marin Olp M.D.   On: 07/14/2016 13:29     Medications:     Scheduled Medications: . atorvastatin  10 mg Oral Daily  . cefTRIAXone (ROCEPHIN)  IV  2 g Intravenous Q24H  . furosemide  80 mg Intravenous BID  . neomycin-polymyxin b-dexamethasone  1 application Both Eyes QHS  . omega-3 acid ethyl esters  1 g Oral Daily  . polyvinyl alcohol  1 drop Both Eyes QHS  . sodium chloride flush  3 mL Intravenous Q12H    Infusions:   PRN Medications: albuterol, metoprolol, ondansetron **OR** ondansetron (ZOFRAN) IV   Assessment:   1. Sepsis 2/2 E-Coli Bacteremia due to UTI.  UCx and BCx both positive for E-Coli - Continue Ceftriaxone per primary.  Got Vanc/Zosyn on admission.  - CT ABD/Pelvis with scattered hepatic cysts (increased in size from prior exam), bilateral renal cysts, ?inflamatory changes around head of pancreas, Normal appendix, no acute colitis or diverticulitis.  - CCM following. 2. Acute on Chronic Diastolic CHF: Echo [id number] with EF 55-60%, mild MR, PASP 66 mmHg, mildly decreased RV systolic function.  She is volume overloaded on exam now.  Creatinine down but BUN higher.  - Lasix 80 mg IV bid today.  - Need strict I/Os.  3. Acute on chronic hypoxic respiratory failure: Baseline COPD and restriction from body habitus, on 3-4L home oxygen.  Suspect pulmonary edema as cause for worsening.  Much improved after diuresis.  - Currently on 6 liters oxygen.   - Diuretics as above - CCM following as above. 4. ARF on CKD stage III:  Improving. Baseline creatinine 2.0-2.2 5. MR: Suspected to be severe in past but more recently has seemed less significant despite murmur.  Mild to moderate MR by Echo 1/17, echo this admission with mild MR.  6. Atrial Fib: Chronic.  Rate back down with treatment of sepsis.  Currently off Toprol XL.  - Not AC candidate with recurrent severe GI bleeding.  7. PAH:  primarily [place] group 2 based on [place] 3/17.  Not on pulmonary vasodilators.  8. Hypokalemia: Supplement potassium.  9. Deconditioning: Consult PT.   Length of Stay: 4   Amy Clegg NP-C 07/16/2016, 7:31 AM  Advanced Heart Failure Team Pager [phone] (M-F; 7a - 4p)  Please contact [facility] for night-coverage after hours (4p -7a ) and weekends on  [email or web address]  Patient seen with NP, agree with the above note.  Admitted with E coli urosepsis. Now volume overloaded.  - Lasix 80 mg IV bid today.   [name] 07/16/2016 8:59 AM

## 2016-07-16 NOTE — Progress Notes (Signed)
[place] Progress Note Patient Name: JANEA FETSCH DOB: [birth year] MRN: [id number]   Date of Service  07/16/2016  HPI/Events of Note    eICU Interventions  Tylenol x 1 for HA     Intervention Category Minor Interventions: Routine modifications to care plan (e.g. PRN medications for pain, fever)  Hadasa Gasner S. 07/16/2016, 4:47 AM

## 2016-07-16 NOTE — Progress Notes (Addendum)
Patient ID: April Mueller, female   DOB: 1945-09-10, 70 y.o.   MRN: [id number]  PROGRESS NOTE    April Mueller  [id number] DOB: 10-12-1945 DOA: 07/12/2016  PCP: Melinda Crutch, MD   Brief Narrative:  70 year old female with past medical history significant for CKD stage III, COPD, chronic respiratory failure, pulmonary hypertension group 2. Pt presented to hospital 07/12/16 with fevers, chills, nausea and poor by mouth intake for 2 days prior to this admission. She also had shortness of breath, dizziness and lightheadedness. Critical care was consulted by ED physician due to concern for volume status with sepsis. She was started on empiric antibiotics while awaiting culture results. Patient subsequently found to have Escherichia coli UTI and bacteremia.  TRH assumed care 07/16/2016.  Assessment & Plan:   Active Problems:   Sepsis secondary to Escherichia coli UTI and Escherichia coli bacteremia / Leukocytosis  - Blood cx and urine cx from 12/7 are growing E.Coli - Pt now on rocephin - Hemodynamically stable, does not require pressors    Acute on chronic diastolic CHF - 2-D echo with ejection fraction 55%, mild MR, mildly decreased RV systolic function - Still volume overloaded on exam - Continue Lasix 80 mg IV twice a day per cardiology recommendation - Continue daily weight and strict intake and output      Rheumatoid arthritis (HCC) - On Plaquenil at home    Acute on chronic respiratory failure with hypoxia (HCC) / COPD, OSA on CPAP / Secondary pulmonary arterial hypertension - BiPAP intermittently. Currently on nasal cannula oxygen - Respiratory status is stable this morning     CKD (chronic kidney disease) stage 3, GFR 30-59 ml/min - Baseline creatinine between 2 and 2.2 - Creatinine 2.4 today but down from yesterday value of 2.7 - Monitor renal function while patient on Lasix    Troponin elevation - Likely demand ischemia due to CKD, sepsis - Cardio following    Dyslipidemia - Continue statin therapy     Hypokalemia - Due to Laisx  - Supplemented  - Follow up BMP in am     Anemia of chronic kidney disease / IDA - Hemoglobin stable - Pt follows with Dr. 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 Irene Limbo of hematology/ oncology    DVT prophylaxis: SCD's bilaterally  Code Status: full code  Family Communication: no family at the bedside this am Disposition Plan: home in next 2-3 days if resp status stable, also she is still on IV lasix per HF team    Consultants:   Cardiology   Procedures:   On BiPAP intermittently   Antimicrobials:   Rocephin 12/8 -->  Vanco, Zosyn 12/7, 12/8  Meropenem 12/8   Subjective: No overnight events.   Objective: Vitals:   07/16/16 0800 07/16/16 0802 07/16/16 0900 07/16/16 1000  BP: 112/71  98/61 (!) 90/59  Pulse: 78  83 85  Resp: (!) 21  (!) 30 (!) 27  Temp:  98.7 F (37.1 C)    TempSrc:  Axillary    SpO2: 94%  94% 96%  Weight:  Height:        Intake/Output Summary (Last 24 hours) at 07/16/16 1054 Last data filed at 07/16/16 1005  Gross per 24 hour  Intake              980 ml  Output                0 ml  Net              980 ml   Filed Weights   07/15/16 0751 07/16/16 0000 07/16/16 0400  Weight: 94.1 kg (207 lb 7.3 oz) (S) 91.6 kg (201 lb 15.1 oz) 93.4 kg (205 lb 14.6 oz)    Examination:  General exam: Appears calm and comfortable, on [place] Respiratory system: Clear to auscultation. Respiratory effort normal. Cardiovascular system: S1 & S2 heard, Rate controlled  Gastrointestinal system: Abdomen is nondistended, soft and nontender. No  organomegaly or masses felt. Normal bowel sounds heard. Central nervous system: Alert and oriented. No focal neurological deficits. Extremities: Symmetric 5 x 5 power. Skin: No rashes, lesions or ulcers Psychiatry: Judgement and insight appear normal. Mood & affect appropriate.   Data Reviewed: I have personally reviewed following labs and imaging studies  CBC:  Recent Labs Lab 07/12/16 1446 07/12/16 1933 07/13/16 0446 07/14/16 0210 07/16/16 0440  WBC 17.5* 21.3* 20.2* 12.8* 10.4  NEUTROABS 15.6* 19.5* 18.6*  --  8.4*  HGB 11.4* 10.4* 10.8* 10.4* 10.6*  HCT 37.4 33.9* 35.7* 34.2* 34.7*  MCV 96.9 98.0 98.1 97.2 96.1  PLT 68* 75* 73* 59* 75*   Basic Metabolic Panel:  Recent Labs Lab 07/12/16 1446 07/13/16 0446 07/13/16 1650 07/14/16 0210 07/15/16 0241 07/16/16 0440  NA 137 137 135 136  --  139  K 4.5 4.6 4.7 4.5  --  3.1*  CL 103 104 102 103  --  99*  CO2 27 24 20* 25  --  30  GLUCOSE 116* 96 85 127*  --  100*  BUN 46* 48* 46* 53*  --  68*  CREATININE 2.94* 2.99* 2.89* 2.76*  --  2.43*  CALCIUM 10.1 10.1 10.2 9.8  --  9.3  MG  --  1.9  --   --  2.1 2.1  PHOS  --   --   --   --  4.3 3.1   GFR: Estimated Creatinine Clearance: 24.6 mL/min (by C-G formula based on SCr of 2.43 mg/dL (H)). Liver Function Tests:  Recent Labs Lab 07/12/16 1446 07/13/16 0446 07/14/16 0210  AST 20 18 19   ALT 14 15 15   ALKPHOS 107 88 74  BILITOT 0.9 1.1 0.6  PROT 7.3 7.1 6.7  ALBUMIN 3.1* 2.7* 2.6*    Recent Labs Lab 07/12/16 1446 07/13/16 1650  LIPASE 15 14   No results for input(s): AMMONIA in the last 168 hours. Coagulation Profile:  Recent Labs Lab 07/12/16 1933 07/14/16 0210  INR 1.41 1.26   Cardiac Enzymes:  Recent Labs Lab 07/13/16 0646 07/13/16 1204 07/13/16 1752  TROPONINI 0.05* 0.05* 0.06*   BNP (last 3 results) No results for input(s): PROBNP in the last 8760 hours. HbA1C: No results for input(s): HGBA1C in the last 72 hours. CBG: No results for  input(s): GLUCAP in the last 168 hours. Lipid Profile: No results for input(s): CHOL, HDL, LDLCALC, TRIG, CHOLHDL, LDLDIRECT in the last 72 hours. Thyroid Function Tests: No results for input(s): TSH, T4TOTAL, FREET4, T3FREE, THYROIDAB in the last 72 hours. Anemia Panel: No results for input(s): VITAMINB12, FOLATE, FERRITIN, TIBC,  IRON, RETICCTPCT in the last 72 hours. Urine analysis:    Component Value Date/Time   COLORURINE YELLOW 07/12/2016 2046   APPEARANCEUR CLOUDY (A) 07/12/2016 2046   LABSPEC 1.010 07/12/2016 2046   PHURINE 5.0 07/12/2016 2046   GLUCOSEU NEGATIVE 07/12/2016 2046   HGBUR LARGE (A) 07/12/2016 2046   BILIRUBINUR NEGATIVE 07/12/2016 2046   KETONESUR NEGATIVE 07/12/2016 2046   PROTEINUR 30 (A) 07/12/2016 2046   NITRITE NEGATIVE 07/12/2016 2046   LEUKOCYTESUR LARGE (A) 07/12/2016 2046   Sepsis Labs: @LABRCNTIP (procalcitonin:4,lacticidven:4)  Culture, blood (Routine x 2)     Status: Abnormal   Collection Time: 07/12/16  2:37 PM  Result Value Ref Range Status   Specimen Description BLOOD RIGHT ANTECUBITAL  Final   Special Requests BOTTLES DRAWN AEROBIC AND ANAEROBIC 5CC  Final   Culture  Setup Time   Final   Culture ESCHERICHIA COLI (A)  Final   Report Status 07/15/2016 FINAL  Final   Organism ID, Bacteria ESCHERICHIA COLI  Final      Susceptibility   Escherichia coli - MIC*    AMPICILLIN <=2 SENSITIVE Sensitive     CEFAZOLIN <=4 SENSITIVE Sensitive     CEFEPIME <=1 SENSITIVE Sensitive     CEFTAZIDIME <=1 SENSITIVE Sensitive     CEFTRIAXONE <=1 SENSITIVE Sensitive     CIPROFLOXACIN <=0.25 SENSITIVE Sensitive     GENTAMICIN <=1 SENSITIVE Sensitive     IMIPENEM <=0.25 SENSITIVE Sensitive     TRIMETH/SULFA <=20 SENSITIVE Sensitive     AMPICILLIN/SULBACTAM <=2 SENSITIVE Sensitive     PIP/TAZO <=4 SENSITIVE Sensitive     Extended ESBL NEGATIVE Sensitive     * ESCHERICHIA COLI  Blood Culture ID Panel (Reflexed)     Status: Abnormal   Collection Time:  07/12/16  2:37 PM  Result Value Ref Range Status   Enterococcus species NOT DETECTED NOT DETECTED Final   Listeria monocytogenes NOT DETECTED NOT DETECTED Final   Staphylococcus species NOT DETECTED NOT DETECTED Final   Staphylococcus aureus NOT DETECTED NOT DETECTED Final   Streptococcus species NOT DETECTED NOT DETECTED Final   Streptococcus agalactiae NOT DETECTED NOT DETECTED Final   Streptococcus pneumoniae NOT DETECTED NOT DETECTED Final   Streptococcus pyogenes NOT DETECTED NOT DETECTED Final   Acinetobacter baumannii NOT DETECTED NOT DETECTED Final   Enterobacteriaceae species DETECTED (A) NOT DETECTED Final   Enterobacter cloacae complex NOT DETECTED NOT DETECTED Final   Escherichia coli DETECTED (A) NOT DETECTED Final   Klebsiella oxytoca NOT DETECTED NOT DETECTED Final   Klebsiella pneumoniae NOT DETECTED NOT DETECTED Final   Proteus species NOT DETECTED NOT DETECTED Final   Serratia marcescens NOT DETECTED NOT DETECTED Final   Carbapenem resistance NOT DETECTED NOT DETECTED Final   Haemophilus influenzae NOT DETECTED NOT DETECTED Final   Neisseria meningitidis NOT DETECTED NOT DETECTED Final   Pseudomonas aeruginosa NOT DETECTED NOT DETECTED Final   Candida albicans NOT DETECTED NOT DETECTED Final   Candida glabrata NOT DETECTED NOT DETECTED Final   Candida krusei NOT DETECTED NOT DETECTED Final   Candida parapsilosis NOT DETECTED NOT DETECTED Final   Candida tropicalis NOT DETECTED NOT DETECTED Final  Culture, blood (Routine x 2)     Status: Abnormal   Collection Time: 07/12/16  3:12 PM  Result Value Ref Range Status   Specimen Description BLOOD RIGHT FOREARM  Final   Special Requests BOTTLES DRAWN AEROBIC AND ANAEROBIC 5CC  Final   Culture  Setup Time   Final   Culture (A)  Final  ESCHERICHIA COLI    Report Status 07/15/2016 FINAL  Final  Urine culture     Status: Abnormal   Collection Time: 07/12/16  3:21 PM  Result Value Ref Range Status   Specimen  Description URINE, CLEAN CATCH  Final   Special Requests NONE  Final   Report Status 07/14/2016 FINAL  Final   Organism ID, Bacteria ESCHERICHIA COLI (A)  Final      Susceptibility   Escherichia coli - MIC*    AMPICILLIN <=2 SENSITIVE Sensitive     CEFAZOLIN <=4 SENSITIVE Sensitive     CEFTRIAXONE <=1 SENSITIVE Sensitive     CIPROFLOXACIN <=0.25 SENSITIVE Sensitive     GENTAMICIN <=1 SENSITIVE Sensitive     IMIPENEM <=0.25 SENSITIVE Sensitive     NITROFURANTOIN <=16 SENSITIVE Sensitive     TRIMETH/SULFA <=20 SENSITIVE Sensitive     AMPICILLIN/SULBACTAM <=2 SENSITIVE Sensitive     PIP/TAZO <=4 SENSITIVE Sensitive     Extended ESBL NEGATIVE Sensitive     * >=100,000 COLONIES/mL ESCHERICHIA COLI  MRSA PCR Screening     Status: None   Collection Time: 07/12/16  6:53 PM  Result Value Ref Range Status   MRSA by PCR NEGATIVE NEGATIVE Final  Culture, sputum-assessment     Status: None   Collection Time: 07/13/16  6:29 AM  Result Value Ref Range Status   Specimen Description EXPECTORATED SPUTUM  Final   Special Requests NONE  Final   Sputum evaluation   Final    Sputum specimen not acceptable for testing.  Please recollect.   RESULT CALLED TO, READ BACK BY AND VERIFIED WITH: Luella Cook, NURSE AT 1035 ON [id number] BY Rhea Bleacher    Report Status 07/13/2016 FINAL  Final      Radiology Studies: Ct Abdomen Pelvis Wo Contrast Result Date: 07/13/2016 1. Cardiomegaly is noted. Again noted aneurysmal dilatation of descending aorta stable from prior exam. Small right pleural effusion with right base posterior medially atelectasis. 2. Scattered hepatic cysts are noted progression in size from prior exam. 3. Bilateral renal cysts.  Left nonobstructive nephrolithiasis. 4. Micro nodular liver contour. Chronic liver disease or cirrhosis cannot be excluded. 5. There is subtle mild stranding of the fat surrounding the pancreatic head region in medial and inferior to duodenum. Mild focal pancreatitis or  duodenal inflammation cannot be excluded. Clinical correlation is necessary. Study is limited without IV contrast. 6. No small bowel or colonic obstruction. No pericecal inflammation. Normal appendix. 7. Distal colonic diverticula. No evidence of acute colitis or diverticulitis. 8. There is a dominant follicle/cyst within right ovary measures 2 cm. Electronically Signed   By: Lahoma Crocker M.D.   On: 07/13/2016 13:43   Dg Chest 2 View Result Date: 07/13/2016 No acute abnormalities. Chronic cardiomegaly. 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 Electronically Signed   By: Lorriane Shire M.D.   On: 07/13/2016 11:29   US Renal Result Date: 07/13/2016 Findings consistent with chronic medical renal disease. No hydronephrosis. Bilateral renal cysts. Electronically Signed   By: Jeb Levering M.D.   On: 07/13/2016 03:23   Dg Chest Port 1 View Result Date: 07/12/2016 Suspect mild asymmetric pulmonary edema. Electronically Signed   By: Rolm Baptise M.D.   On: 07/12/2016 15:49   Dg Chest Port 1v Same Day Result Date: 07/14/2016 Patchy perihilar opacification which may be due to mild interstitial edema versus infection. Stable cardiomegaly. Electronically Signed   By: Marin Olp M.D.   On: 07/14/2016 13:29  Scheduled Meds: . atorvastatin  10 mg Oral Daily  . cefTRIAXone (ROCEPHIN)  IV  2 g Intravenous Q24H  . furosemide  80 mg Intravenous BID  . omega-3 acid ethyl esters  1 g Oral Daily  . potassium chloride  40 mEq Oral BID   Continuous Infusions:   LOS: 4 days    Time spent: 25 minutes  Greater than 50% of the time spent on counseling and coordinating the care.   Leisa Lenz, MD [facility] Pager [phone]  If 7PM-7AM, please contact night-coverage [email or web address] Password [facility] 07/16/2016, 10:54 AM

## 2016-07-17 LAB — CBC WITH DIFFERENTIAL/PLATELET
Basophils Absolute: 0 10*3/uL (ref 0.0–0.1)
Basophils Relative: 0 %
EOS ABS: 0.2 10*3/uL (ref 0.0–0.7)
EOS PCT: 2 %
HCT: 34.1 % — ABNORMAL LOW (ref 36.0–46.0)
HEMOGLOBIN: 10.5 g/dL — AB (ref 12.0–15.0)
LYMPHS PCT: 8 %
Lymphs Abs: 0.8 10*3/uL (ref 0.7–4.0)
MCH: 29 pg (ref 26.0–34.0)
MCHC: 30.8 g/dL (ref 30.0–36.0)
MCV: 94.2 fL (ref 78.0–100.0)
MONO ABS: 1.2 10*3/uL — AB (ref 0.1–1.0)
Monocytes Relative: 12 %
NEUTROS PCT: 78 %
Neutro Abs: 8.2 10*3/uL — ABNORMAL HIGH (ref 1.7–7.7)
PLATELETS: 74 10*3/uL — AB (ref 150–400)
RBC: 3.62 MIL/uL — AB (ref 3.87–5.11)
RDW: 16.6 % — ABNORMAL HIGH (ref 11.5–15.5)
WBC: 10.4 10*3/uL (ref 4.0–10.5)

## 2016-07-17 LAB — BASIC METABOLIC PANEL
ANION GAP: 11 (ref 5–15)
BUN: 68 mg/dL — ABNORMAL HIGH (ref 6–20)
CHLORIDE: 98 mmol/L — AB (ref 101–111)
CO2: 31 mmol/L (ref 22–32)
Calcium: 9.2 mg/dL (ref 8.9–10.3)
Creatinine, Ser: 2.42 mg/dL — ABNORMAL HIGH (ref 0.44–1.00)
GFR calc Af Amer: 23 mL/min — ABNORMAL LOW (ref >60)
GFR, EST NON AFRICAN AMERICAN: 19 mL/min — AB (ref >60)
Glucose, Bld: 117 mg/dL — ABNORMAL HIGH (ref 65–99)
POTASSIUM: 3 mmol/L — AB (ref 3.5–5.1)
SODIUM: 140 mmol/L (ref 135–145)

## 2016-07-17 LAB — PHOSPHORUS: Phosphorus: 3.1 mg/dL (ref 2.5–4.6)

## 2016-07-17 LAB — MAGNESIUM: MAGNESIUM: 2.1 mg/dL (ref 1.7–2.4)

## 2016-07-17 MED ORDER — HYDROXYCHLOROQUINE SULFATE 200 MG PO TABS
200.0000 mg | ORAL_TABLET | Freq: Two times a day (BID) | ORAL | Status: DC
  Administered 2016-07-17 – 2016-07-23 (×12): 200 mg via ORAL
  Filled 2016-07-17 (×16): qty 1

## 2016-07-17 MED ORDER — POTASSIUM CHLORIDE CRYS ER 20 MEQ PO TBCR
40.0000 meq | EXTENDED_RELEASE_TABLET | Freq: Two times a day (BID) | ORAL | Status: AC
  Administered 2016-07-17 (×2): 40 meq via ORAL
  Filled 2016-07-17 (×2): qty 2

## 2016-07-17 MED ORDER — TORSEMIDE 20 MG PO TABS
80.0000 mg | ORAL_TABLET | Freq: Every day | ORAL | Status: DC
  Administered 2016-07-18 – 2016-07-22 (×5): 80 mg via ORAL
  Filled 2016-07-17 (×5): qty 4

## 2016-07-17 MED ORDER — ALLOPURINOL 300 MG PO TABS
300.0000 mg | ORAL_TABLET | Freq: Every day | ORAL | Status: DC
  Administered 2016-07-17 – 2016-07-23 (×7): 300 mg via ORAL
  Filled 2016-07-17 (×7): qty 1

## 2016-07-17 MED ORDER — POTASSIUM CHLORIDE CRYS ER 20 MEQ PO TBCR
40.0000 meq | EXTENDED_RELEASE_TABLET | Freq: Once | ORAL | Status: AC
  Administered 2016-07-17: 40 meq via ORAL
  Filled 2016-07-17: qty 2

## 2016-07-17 MED ORDER — TORSEMIDE 20 MG PO TABS
60.0000 mg | ORAL_TABLET | Freq: Every day | ORAL | Status: DC
  Administered 2016-07-17 – 2016-07-22 (×5): 60 mg via ORAL
  Filled 2016-07-17 (×5): qty 3

## 2016-07-17 NOTE — Evaluation (Signed)
Physical Therapy Evaluation Patient Details Name: April Mueller MRN: [id number] DOB: Jan 04, 1946 Today's Date: 07/17/2016   History of Present Illness  Pt adm with sepsis due to UTI and acute on chronic diastolic heart failure. PMH - chronic resp failure, ckd, copd, chf  Clinical Impression  Pt admitted with above diagnosis and presents to PT with functional limitations due to deficits listed below (See PT problem list). Pt needs skilled PT to maximize independence and safety to allow discharge to ST-SNF. Pt demonstrated limited mobility and ability to care for herself at home alone due to low functional activity tolerance. Expect pt will make slow, steady progress and that ST-SNF will allow her to regain independence and eventually return home.     Follow Up Recommendations SNF    Equipment Recommendations  None recommended by PT    Recommendations for Other Services       Precautions / Restrictions Precautions Precautions: Fall Precaution Comments: At baseline pt incr supplemental O2 with activity Restrictions Weight Bearing Restrictions: No      Mobility  Bed Mobility               General bed mobility comments: Pt up in recliner  Transfers Overall transfer level: Needs assistance Equipment used: 4-wheeled walker Transfers: Sit to/from [employer] Transfers Sit to Stand: Min assist Stand pivot transfers: Min assist       General transfer comment: Assist for balance and safety  Ambulation/Gait Ambulation/Gait assistance: Min guard Ambulation Distance (Feet): 125 Feet Assistive device: 4-wheeled walker Gait Pattern/deviations: Step-through pattern;Decreased step length - right;Decreased step length - left;Trunk flexed Gait velocity: decr Gait velocity interpretation: Below normal speed for age/gender General Gait Details: Assist for balance and safety. Pt with two standing rest breaks with forearms propped on rollator in tripod position. Amb on 6L of  O2. SpO2 93% or greater during first two thirds of amb and then began dropping and reached 88% before pt stopped to rest.  Stairs            Wheelchair Mobility    Modified Rankin (Stroke Patients Only)       Balance Overall balance assessment: Needs assistance Sitting-balance support: No upper extremity supported;Feet supported Sitting balance-Leahy Scale: Good     Standing balance support: Single extremity supported Standing balance-Leahy Scale: Poor Standing balance comment: UE support and supervision for static standing                             Pertinent Vitals/Pain Pain Assessment: No/denies pain    Home Living Family/patient expects to be discharged to:: Skilled nursing facility Living Arrangements: Alone   Type of Home: Apartment Home Access: Ramped entrance     Home Layout: One level Home Equipment: Walker - 4 wheels;Shower seat Additional Comments: Home O2. 3L at rest and 5-6L with exertion    Prior Function Level of Independence: Independent with assistive device(s)         Comments: Uses rollator at times     Hand Dominance        Extremity/Trunk Assessment   Upper Extremity Assessment: Defer to OT evaluation           Lower Extremity Assessment: Generalized weakness         Communication   Communication: No difficulties  Cognition Arousal/Alertness: Awake/alert Behavior During Therapy: WFL for tasks assessed/performed Overall Cognitive Status: Within Functional Limits for tasks assessed  General Comments      Exercises     Assessment/Plan    PT Assessment Patient needs continued PT services  PT Problem List Decreased strength;Decreased activity tolerance;Decreased balance;Decreased mobility;Cardiopulmonary status limiting activity          PT Treatment Interventions DME instruction;Gait training;Functional mobility training;Therapeutic activities;Therapeutic  exercise;Balance training;Patient/family education    PT Goals (Current goals can be found in the Care Plan section)  Acute Rehab PT Goals Patient Stated Goal: go to rehab to get stronger prior to returning home PT Goal Formulation: With patient Time For Goal Achievement: 07/31/16 Potential to Achieve Goals: Good    Frequency Min 3X/week   Barriers to discharge Decreased caregiver support Lives alone    Co-evaluation               End of Session Equipment Utilized During Treatment: Gait belt;Oxygen Activity Tolerance: Patient limited by fatigue Patient left: in chair;with call bell/phone within reach Nurse Communication: Mobility status         Time: [id number] PT Time Calculation (min) (ACUTE ONLY): 34 min   Charges:   PT Evaluation $PT Eval Moderate Complexity: 1 Procedure PT Treatments $Gait Training: 8-22 mins   PT G CodesShary Decamp [place]ok 08-12-2016, 2:14 PM [employer] PT [phone]

## 2016-07-17 NOTE — Progress Notes (Addendum)
Patient ID: April Mueller, female   DOB: 02/20/1946, 70 y.o.   MRN: [id number]  PROGRESS NOTE    April Mueller  [id number] DOB: Oct 21, 1945 DOA: 07/12/2016  PCP: Melinda Crutch, MD   Brief Narrative:  70 year old female with past medical history significant for CKD stage III, COPD, chronic respiratory failure, pulmonary hypertension group 2. Pt presented to hospital 07/12/16 with fevers, chills, nausea and poor by mouth intake for 2 days prior to this admission. She also had shortness of breath, dizziness and lightheadedness. Critical care was consulted by ED physician due to concern for volume status with sepsis. She was started on empiric antibiotics while awaiting culture results. Patient subsequently found to have Escherichia coli UTI and bacteremia.  TRH assumed care 07/16/2016.  Assessment & Plan:   Active Problems:   Sepsis secondary to Escherichia coli UTI and Escherichia coli bacteremia / Leukocytosis  - Blood cx and urine cx from 12/7 are growing E.Coli - Continue Rocephin  - Hemodynamically stable    Acute on chronic diastolic CHF - 2-D echo with ejection fraction 55%, mild MR, mildly decreased RV systolic function - Per cardiology continue Lasix 80 mg IV twice a day per cardiology recommendation - Continue daily weight and strict intake and output - Weight since 12/11 - 93.4 kg --> 12/12 - 90.2 kg  - HF team is following       Rheumatoid arthritis ([place]) - On Plaquenil at home    Acute on chronic respiratory failure with hypoxia ([place]) / COPD, OSA on CPAP / Secondary pulmonary arterial hypertension - BiPAP intermittently. Currently on nasal cannula oxygen - Respiratory status stable      Acute renal failure superimposed on CKD (chronic kidney disease) stage 3, GFR 30-59 ml/min - Baseline creatinine between 2 and 2.2 - Creatinine stable in 2.4 range  - Monitor renal function daily since patient on Lasix    Troponin elevation - Likely demand ischemia due to CKD,  sepsis - Cardio following     Dyslipidemia - Continue atorvastatin     Hypokalemia - Due to Laisx  - Continue to supplement  - Follow up BMP in am     HTN (hypertension), essential  - On IV lasix     Persistent atrial fibrillation (HCC) - CHADS vasc 4 - Has been taken off of Eliquis in past by cardio in the setting of GI bleed  - Currently off Toprol XL      Anemia of chronic kidney disease / IDA - Hemoglobin stable - Pt follows with Dr. 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 Irene Limbo of hematology/ oncology    DVT prophylaxis: SCD's bilaterally  Code Status: full code  Family Communication: no family at the bedside this am Disposition Plan: home once cleared by cardiology    Consultants:   Cardiology   Procedures:   On BiPAP intermittently   Antimicrobials:   Rocephin 12/8 -->  Vanco, Zosyn 12/7, 12/8  Meropenem 12/8   Subjective: No overnight events.   Objective: Vitals:   07/17/16 0700 07/17/16 0759 07/17/16 0800 07/17/16 0900  BP: 107/76  100/66 101/70  Pulse: 84  85 86  Resp: (!) 28  (!) 22 (!) 30  Temp:  98.6 F (37 C)    TempSrc:  Oral    SpO2: 99%  93% 98%  Weight:  90.2 kg (198 lb 13.7 oz)    Height:        Intake/Output Summary (Last 24 hours) at 07/17/16 0946 Last data filed at 07/17/16 0800  Gross per 24 hour  Intake              710 ml  Output              200 ml  Net              510 ml   Filed Weights   07/16/16 0400 07/17/16 0407 07/17/16 0759  Weight: 93.4 kg (205 lb 14.6 oz) 94.6 kg (208 lb 8.9 oz) 90.2 kg (198 lb 13.7 oz)    Examination:  General exam: No acute distress  Respiratory system: No wheezing, no rhonchi . Cardiovascular system: S1 & S2 heard, Rate controlled  Gastrointestinal system: (+) BS, non tender abdomen  Central nervous system: No focal  neurological deficits. Extremities: warm, dry, no significant edema in LE Skin: warm, dry  Psychiatry: Normal mood and affect   Data Reviewed: I have personally reviewed following labs and imaging studies  CBC:  Recent Labs Lab 07/12/16 1446 07/12/16 1933 07/13/16 0446 07/14/16 0210 07/16/16 0440 07/17/16 0246  WBC 17.5* 21.3* 20.2* 12.8* 10.4 10.4  NEUTROABS 15.6* 19.5* 18.6*  --  8.4* 8.2*  HGB 11.4* 10.4* 10.8* 10.4* 10.6* 10.5*  HCT 37.4 33.9* 35.7* 34.2* 34.7* 34.1*  MCV 96.9 98.0 98.1 97.2 96.1 94.2  PLT 68* 75* 73* 59* 75* 74*   Basic Metabolic Panel:  Recent Labs Lab 07/13/16 0446 07/13/16 1650 07/14/16 0210 07/15/16 0241 07/16/16 0440 07/17/16 0246  NA 137 135 136  --  139 140  K 4.6 4.7 4.5  --  3.1* 3.0*  CL 104 102 103  --  99* 98*  CO2 24 20* 25  --  30 31  GLUCOSE 96 85 127*  --  100* 117*  BUN 48* 46* 53*  --  68* 68*  CREATININE 2.99* 2.89* 2.76*  --  2.43* 2.42*  CALCIUM 10.1 10.2 9.8  --  9.3 9.2  MG 1.9  --   --  2.1 2.1 2.1  PHOS  --   --   --  4.3 3.1 3.1   GFR: Estimated Creatinine Clearance: 24.2 mL/min (by C-G formula based on SCr of 2.42 mg/dL (H)). Liver Function Tests:  Recent Labs Lab 07/12/16 1446 07/13/16 0446 07/14/16 0210  AST 20 18 19   ALT 14 15 15   ALKPHOS 107 88 74  BILITOT 0.9 1.1 0.6  PROT 7.3 7.1 6.7  ALBUMIN 3.1* 2.7* 2.6*    Recent Labs Lab 07/12/16 1446 07/13/16 1650  LIPASE 15 14   No results for input(s): AMMONIA in the last 168 hours. Coagulation Profile:  Recent Labs Lab 07/12/16 1933 07/14/16 0210  INR 1.41 1.26   Cardiac Enzymes:  Recent Labs Lab 07/13/16 0646 07/13/16 1204 07/13/16 1752  TROPONINI 0.05* 0.05* 0.06*   BNP (last 3 results) No results for input(s): PROBNP in the last 8760 hours. HbA1C: No results for input(s): HGBA1C in the last 72 hours. CBG: No results for input(s): GLUCAP in the last 168 hours. Lipid Profile: No results for input(s): CHOL, HDL, LDLCALC, TRIG,  CHOLHDL, LDLDIRECT in the last 72 hours. Thyroid Function Tests: No results for input(s): TSH, T4TOTAL, FREET4, T3FREE, THYROIDAB in the last 72 hours. Anemia Panel: No results for input(s): VITAMINB12, FOLATE, FERRITIN, TIBC, IRON, RETICCTPCT in the last 72 hours. Urine  analysis:    Component Value Date/Time   COLORURINE YELLOW 07/12/2016 2046   APPEARANCEUR CLOUDY (A) 07/12/2016 2046   LABSPEC 1.010 07/12/2016 2046   PHURINE 5.0 07/12/2016 2046   GLUCOSEU NEGATIVE 07/12/2016 2046   HGBUR LARGE (A) 07/12/2016 2046   BILIRUBINUR NEGATIVE 07/12/2016 2046   KETONESUR NEGATIVE 07/12/2016 2046   PROTEINUR 30 (A) 07/12/2016 2046   NITRITE NEGATIVE 07/12/2016 2046   LEUKOCYTESUR LARGE (A) 07/12/2016 2046   Sepsis Labs: @LABRCNTIP (procalcitonin:4,lacticidven:4)  Culture, blood (Routine x 2)     Status: Abnormal   Collection Time: 07/12/16  2:37 PM  Result Value Ref Range Status   Specimen Description BLOOD RIGHT ANTECUBITAL  Final   Special Requests BOTTLES DRAWN AEROBIC AND ANAEROBIC 5CC  Final   Culture  Setup Time   Final   Culture ESCHERICHIA COLI (A)  Final   Report Status 07/15/2016 FINAL  Final   Organism ID, Bacteria ESCHERICHIA COLI  Final      Susceptibility   Escherichia coli - MIC*    AMPICILLIN <=2 SENSITIVE Sensitive     CEFAZOLIN <=4 SENSITIVE Sensitive     CEFEPIME <=1 SENSITIVE Sensitive     CEFTAZIDIME <=1 SENSITIVE Sensitive     CEFTRIAXONE <=1 SENSITIVE Sensitive     CIPROFLOXACIN <=0.25 SENSITIVE Sensitive     GENTAMICIN <=1 SENSITIVE Sensitive     IMIPENEM <=0.25 SENSITIVE Sensitive     TRIMETH/SULFA <=20 SENSITIVE Sensitive     AMPICILLIN/SULBACTAM <=2 SENSITIVE Sensitive     PIP/TAZO <=4 SENSITIVE Sensitive     Extended ESBL NEGATIVE Sensitive     * ESCHERICHIA COLI  Blood Culture ID Panel (Reflexed)     Status: Abnormal   Collection Time: 07/12/16  2:37 PM  Result Value Ref Range Status   Enterococcus species NOT DETECTED NOT DETECTED Final    Listeria monocytogenes NOT DETECTED NOT DETECTED Final   Staphylococcus species NOT DETECTED NOT DETECTED Final   Staphylococcus aureus NOT DETECTED NOT DETECTED Final   Streptococcus species NOT DETECTED NOT DETECTED Final   Streptococcus agalactiae NOT DETECTED NOT DETECTED Final   Streptococcus pneumoniae NOT DETECTED NOT DETECTED Final   Streptococcus pyogenes NOT DETECTED NOT DETECTED Final   Acinetobacter baumannii NOT DETECTED NOT DETECTED Final   Enterobacteriaceae species DETECTED (A) NOT DETECTED Final   Enterobacter cloacae complex NOT DETECTED NOT DETECTED Final   Escherichia coli DETECTED (A) NOT DETECTED Final   Klebsiella oxytoca NOT DETECTED NOT DETECTED Final   Klebsiella pneumoniae NOT DETECTED NOT DETECTED Final   Proteus species NOT DETECTED NOT DETECTED Final   Serratia marcescens NOT DETECTED NOT DETECTED Final   Carbapenem resistance NOT DETECTED NOT DETECTED Final   Haemophilus influenzae NOT DETECTED NOT DETECTED Final   Neisseria meningitidis NOT DETECTED NOT DETECTED Final   Pseudomonas aeruginosa NOT DETECTED NOT DETECTED Final   Candida albicans NOT DETECTED NOT DETECTED Final   Candida glabrata NOT DETECTED NOT DETECTED Final   Candida krusei NOT DETECTED NOT DETECTED Final   Candida parapsilosis NOT DETECTED NOT DETECTED Final   Candida tropicalis NOT DETECTED NOT DETECTED Final  Culture, blood (Routine x 2)     Status: Abnormal   Collection Time: 07/12/16  3:12 PM  Result Value Ref Range Status   Specimen Description BLOOD RIGHT FOREARM  Final   Special Requests BOTTLES DRAWN AEROBIC AND ANAEROBIC 5CC  Final   Culture  Setup Time   Final   Culture (A)  Final    ESCHERICHIA COLI  Report Status 07/15/2016 FINAL  Final  Urine culture     Status: Abnormal   Collection Time: 07/12/16  3:21 PM  Result Value Ref Range Status   Specimen Description URINE, CLEAN CATCH  Final   Special Requests NONE  Final   Report Status 07/14/2016 FINAL  Final    Organism ID, Bacteria ESCHERICHIA COLI (A)  Final      Susceptibility   Escherichia coli - MIC*    AMPICILLIN <=2 SENSITIVE Sensitive     CEFAZOLIN <=4 SENSITIVE Sensitive     CEFTRIAXONE <=1 SENSITIVE Sensitive     CIPROFLOXACIN <=0.25 SENSITIVE Sensitive     GENTAMICIN <=1 SENSITIVE Sensitive     IMIPENEM <=0.25 SENSITIVE Sensitive     NITROFURANTOIN <=16 SENSITIVE Sensitive     TRIMETH/SULFA <=20 SENSITIVE Sensitive     AMPICILLIN/SULBACTAM <=2 SENSITIVE Sensitive     PIP/TAZO <=4 SENSITIVE Sensitive     Extended ESBL NEGATIVE Sensitive     * >=100,000 COLONIES/mL ESCHERICHIA COLI  MRSA PCR Screening     Status: None   Collection Time: 07/12/16  6:53 PM  Result Value Ref Range Status   MRSA by PCR NEGATIVE NEGATIVE Final  Culture, sputum-assessment     Status: None   Collection Time: 07/13/16  6:29 AM  Result Value Ref Range Status   Specimen Description EXPECTORATED SPUTUM  Final   Special Requests NONE  Final   Sputum evaluation   Final    Sputum specimen not acceptable for testing.  Please recollect.   RESULT CALLED TO, READ BACK BY AND VERIFIED WITH: Luella Cook, NURSE AT 1035 ON [id number] BY Rhea Bleacher    Report Status 07/13/2016 FINAL  Final      Radiology Studies: Ct Abdomen Pelvis Wo Contrast Result Date: 07/13/2016 1. Cardiomegaly is noted. Again noted aneurysmal dilatation of descending aorta stable from prior exam. Small right pleural effusion with right base posterior medially atelectasis. 2. Scattered hepatic cysts are noted progression in size from prior exam. 3. Bilateral renal cysts.  Left nonobstructive nephrolithiasis. 4. Micro nodular liver contour. Chronic liver disease or cirrhosis cannot be excluded. 5. There is subtle mild stranding of the fat surrounding the pancreatic head region in medial and inferior to duodenum. Mild focal pancreatitis or duodenal inflammation cannot be excluded. Clinical correlation is necessary. Study is limited without IV contrast. 6. No  small bowel or colonic obstruction. No pericecal inflammation. Normal appendix. 7. Distal colonic diverticula. No evidence of acute colitis or diverticulitis. 8. There is a dominant follicle/cyst within right ovary measures 2 cm. Electronically Signed   By: Lahoma Crocker M.D.   On: 07/13/2016 13:43   Dg Chest 2 View Result Date: 07/13/2016 No acute abnormalities. Chronic cardiomegaly. 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 Electronically Signed   By: Lorriane Shire M.D.   On: 07/13/2016 11:29   US Renal Result Date: 07/13/2016 Findings consistent with chronic medical renal disease. No hydronephrosis. Bilateral renal cysts. Electronically Signed   By: Jeb Levering M.D.   On: 07/13/2016 03:23   Dg Chest Port 1 View Result Date: 07/12/2016 Suspect mild asymmetric pulmonary edema. Electronically Signed   By: Rolm Baptise M.D.   On: 07/12/2016 15:49   Dg Chest Port 1v Same Day Result Date: 07/14/2016 Patchy perihilar opacification which may be due to mild interstitial edema versus infection. Stable cardiomegaly. Electronically Signed   By: Marin Olp M.D.   On: 07/14/2016 13:29      Scheduled Meds: . atorvastatin  10 mg Oral Daily  . cefTRIAXone (ROCEPHIN)  IV  2 g Intravenous Q24H  . furosemide  80 mg Intravenous BID  . omega-3 acid ethyl esters  1 g Oral Daily  . potassium chloride  40 mEq Oral BID   Continuous Infusions:   LOS: 5 days    Time spent: 25 minutes  Greater than 50% of the time spent on counseling and coordinating the care.   Leisa Lenz, MD [facility] Pager [phone]  If 7PM-7AM, please contact night-coverage [email or web address] Password [facility] 07/17/2016, 9:46 AM

## 2016-07-17 NOTE — Progress Notes (Signed)
Advanced Heart Failure Rounding Note   Subjective:     70 y.o. 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   Admitted with ecoli urosespis. WBC 17.5. Procalcitonin 40.47.Transferred to ICU on 12/8 for respiratory failure and volume overload.   Continues to diurese with IV lasix. Weight down 10  Pounds (on a different scale) . Able to get OOB to the chair.   Feeling better today. Remains SOB with exertion.   Objective:   Weight Range:  Vital Signs:   Temp:  [97.6 F (36.4 C)-99 F (37.2 C)] 98.6 F (37 C) (12/12 0759) Pulse Rate:  [76-95] 86 (12/12 0900) Resp:  [15-32] 30 (12/12 0900) BP: (88-110)/(58-76) 101/70 (12/12 0900) SpO2:  [93 %-99 %] 98 % (12/12 0900) FiO2 (%):  [40 %] 40 % (12/11 1109) Weight:  [198 lb 13.7 oz (90.2 kg)-208 lb 8.9 oz (94.6 kg)] 198 lb 13.7 oz (90.2 kg) (12/12 0759) Last BM Date: 07/17/16  Weight change: Filed Weights   07/16/16 0400 07/17/16 0407 07/17/16 0759  Weight: 205 lb 14.6 oz (93.4 kg) 208 lb 8.9 oz (94.6 kg) 198 lb 13.7 oz (90.2 kg)    Intake/Output:   Intake/Output Summary (Last 24 hours) at 07/17/16 [id number] Last data filed at 07/17/16 0800  Gross per 24 hour  Intake              710 ml  Output              200 ml  Net              510 ml     Physical Exam: General:   In the chair. 6L [place]  Resp status stable.   HEENT: Normal Neck: supple. JVP 7-8 cm.  Carotids 2+ bilat; no bruits. No lymphadenopathy or thyromegaly appreciated. Cor: PMI nondisplaced. Irregular. . 3/6 MR.  Lungs: Diminished throughout. Clear anteriorly  Abdomen: soft, NT, ND, no HSM. No bruits or masses. +BS  Extremities: no cyanosis, clubbing, rash. No edema. .  Neuro: alert & orientedx3, cranial nerves grossly intact. moves all 4 extremities w/o difficulty. Affect flat.  Telemetry: Reviewed personally, Afib 80s  Labs: Basic Metabolic  Panel:  Recent Labs Lab 07/13/16 0446 07/13/16 1650 07/14/16 0210 07/15/16 0241 07/16/16 0440 07/17/16 0246  NA 137 135 136  --  139 140  K 4.6 4.7 4.5  --  3.1* 3.0*  CL 104 102 103  --  99* 98*  CO2 24 20* 25  --  30 31  GLUCOSE 96 85 127*  --  100* 117*  BUN 48* 46* 53*  --  68* 68*  CREATININE 2.99* 2.89* 2.76*  --  2.43* 2.42*  CALCIUM 10.1 10.2 9.8  --  9.3 9.2  MG 1.9  --   --  2.1 2.1 2.1  PHOS  --   --   --  4.3 3.1 3.1    Liver Function Tests:  Recent Labs Lab 07/12/16 1446 07/13/16 0446 07/14/16 0210  AST 20 18 19   ALT 14 15 15   ALKPHOS 107 88 74  BILITOT 0.9 1.1 0.6  PROT 7.3 7.1 6.7  ALBUMIN 3.1* 2.7* 2.6*    Recent Labs Lab 07/12/16 1446 07/13/16 1650  LIPASE 15 14   No results for input(s): AMMONIA in the last 168 hours.  CBC:  Recent Labs Lab 07/12/16 1446 07/12/16 1933 07/13/16 0446 07/14/16 0210 07/16/16  0440 07/17/16 0246  WBC 17.5* 21.3* 20.2* 12.8* 10.4 10.4  NEUTROABS 15.6* 19.5* 18.6*  --  8.4* 8.2*  HGB 11.4* 10.4* 10.8* 10.4* 10.6* 10.5*  HCT 37.4 33.9* 35.7* 34.2* 34.7* 34.1*  MCV 96.9 98.0 98.1 97.2 96.1 94.2  PLT 68* 75* 73* 59* 75* 74*    Cardiac Enzymes:  Recent Labs Lab 07/13/16 0646 07/13/16 1204 07/13/16 1752  TROPONINI 0.05* 0.05* 0.06*    BNP: BNP (last 3 results)  Recent Labs  04/02/16 0909 04/11/16 0922 07/12/16 1447  BNP 317.2* 180.4* 318.6*    ProBNP (last 3 results) No results for input(s): PROBNP in the last 8760 hours.    Other results:  Imaging: No results found.   Medications:     Scheduled Medications: . atorvastatin  10 mg Oral Daily  . cefTRIAXone (ROCEPHIN)  IV  2 g Intravenous Q24H  . furosemide  80 mg Intravenous BID  . neomycin-polymyxin b-dexamethasone  1 application Both Eyes QHS  . omega-3 acid ethyl esters  1 g Oral Daily  . polyvinyl alcohol  1 drop Both Eyes QHS  . potassium chloride  40 mEq Oral BID  . sodium chloride flush  3 mL Intravenous Q12H     Infusions:   PRN Medications: acetaminophen, albuterol, metoprolol, ondansetron **OR** ondansetron (ZOFRAN) IV   Assessment:   1. Sepsis 2/2 E-Coli Bacteremia due to UTI.  UCx and BCx both positive for E-Coli - Continue Ceftriaxone per primary.  Got Vanc/Zosyn on admission.  - CT ABD/Pelvis with scattered hepatic cysts (increased in size from prior exam), bilateral renal cysts, ?inflamatory changes around head of pancreas, Normal appendix, no acute colitis or diverticulitis.  - CCM following. 2. Acute on Chronic Diastolic CHF: Echo [id number] with EF 55-60%, mild MR, PASP 66 mmHg, mildly decreased RV systolic function. Creatinine 2.4 BUN 68.  Volume status stable. Stop IV lasix transition to torsemide 80 mg am and 60 mg in pm.  - Need strict I/Os.  3. Acute on chronic hypoxic respiratory failure: Baseline COPD and restriction from body habitus, on 3-4L home oxygen.  Suspect pulmonary edema as cause for worsening.  Much improved after diuresis.  - Currently on 6 liters oxygen.   - Diuretics as above - CCM following as above. 4. ARF on CKD stage III:  Improving. Baseline creatinine 2.0-2.2 5. MR: Suspected to be severe in past but more recently has seemed less significant despite murmur.  Mild to moderate MR by Echo 1/17, echo this admission with mild MR.  6. Atrial Fib: Chronic.  Rate back down with treatment of sepsis.  Currently off Toprol XL.  - Not AC candidate with recurrent severe GI bleeding.  7. PAH: primarily [place] group 2 based on [place] 3/17.  Not on pulmonary vasodilators.  8. Hypokalemia: Supplement potassium.  9. Deconditioning: Consult PT.   Doubt she can go home. She wants to go to short term rehab.   Length of Stay: 5   Amy Clegg NP-C 07/17/2016, 9:52 AM  Advanced Heart Failure Team Pager [phone] (M-F; 7a - 4p)  Please contact [facility] for night-coverage after hours (4p -7a ) and weekends on [email or web address]  Patient seen with NP, agree with the above note.  She  looks like she is at baseline in terms of CHF.  Transition to torsemide at home dose.  Needs PT evaluation, likely will need rehab stay.   [name] 07/17/2016 12:21 PM

## 2016-07-18 DIAGNOSIS — D649 Anemia, unspecified: Secondary | ICD-10-CM

## 2016-07-18 DIAGNOSIS — A4151 Sepsis due to Escherichia coli [E. coli]: Secondary | ICD-10-CM

## 2016-07-18 LAB — BASIC METABOLIC PANEL
ANION GAP: 11 (ref 5–15)
BUN: 67 mg/dL — ABNORMAL HIGH (ref 6–20)
CALCIUM: 9.6 mg/dL (ref 8.9–10.3)
CO2: 31 mmol/L (ref 22–32)
CREATININE: 2.17 mg/dL — AB (ref 0.44–1.00)
Chloride: 98 mmol/L — ABNORMAL LOW (ref 101–111)
GFR calc Af Amer: 26 mL/min — ABNORMAL LOW (ref >60)
GFR, EST NON AFRICAN AMERICAN: 22 mL/min — AB (ref >60)
GLUCOSE: 140 mg/dL — AB (ref 65–99)
Potassium: 3.4 mmol/L — ABNORMAL LOW (ref 3.5–5.1)
Sodium: 140 mmol/L (ref 135–145)

## 2016-07-18 LAB — MAGNESIUM: MAGNESIUM: 1.9 mg/dL (ref 1.7–2.4)

## 2016-07-18 LAB — CBC WITH DIFFERENTIAL/PLATELET
BASOS PCT: 0 %
Basophils Absolute: 0 10*3/uL (ref 0.0–0.1)
EOS ABS: 0.2 10*3/uL (ref 0.0–0.7)
Eosinophils Relative: 2 %
HCT: 33.9 % — ABNORMAL LOW (ref 36.0–46.0)
Hemoglobin: 10.5 g/dL — ABNORMAL LOW (ref 12.0–15.0)
Lymphocytes Relative: 12 %
Lymphs Abs: 1.4 10*3/uL (ref 0.7–4.0)
MCH: 29.3 pg (ref 26.0–34.0)
MCHC: 31 g/dL (ref 30.0–36.0)
MCV: 94.7 fL (ref 78.0–100.0)
MONO ABS: 0.7 10*3/uL (ref 0.1–1.0)
Monocytes Relative: 6 %
NEUTROS PCT: 80 %
Neutro Abs: 9.4 10*3/uL — ABNORMAL HIGH (ref 1.7–7.7)
PLATELETS: 94 10*3/uL — AB (ref 150–400)
RBC: 3.58 MIL/uL — AB (ref 3.87–5.11)
RDW: 17 % — AB (ref 11.5–15.5)
WBC: 11.7 10*3/uL — AB (ref 4.0–10.5)

## 2016-07-18 LAB — PHOSPHORUS: Phosphorus: 2.4 mg/dL — ABNORMAL LOW (ref 2.5–4.6)

## 2016-07-18 MED ORDER — POTASSIUM CHLORIDE CRYS ER 20 MEQ PO TBCR
40.0000 meq | EXTENDED_RELEASE_TABLET | Freq: Two times a day (BID) | ORAL | Status: AC
  Administered 2016-07-18 (×2): 40 meq via ORAL
  Filled 2016-07-18 (×2): qty 2

## 2016-07-18 MED ORDER — METOPROLOL SUCCINATE ER 25 MG PO TB24
12.5000 mg | ORAL_TABLET | Freq: Every day | ORAL | Status: DC
  Administered 2016-07-18 – 2016-07-21 (×4): 12.5 mg via ORAL
  Filled 2016-07-18 (×4): qty 1

## 2016-07-18 MED ORDER — LOPERAMIDE HCL 1 MG/5ML PO LIQD
2.0000 mg | ORAL | Status: DC | PRN
  Administered 2016-07-18 – 2016-07-20 (×3): 2 mg via ORAL
  Filled 2016-07-18 (×5): qty 10

## 2016-07-18 NOTE — Clinical Social Work Placement (Signed)
   CLINICAL SOCIAL WORK PLACEMENT  NOTE  Date:  07/18/2016  Patient Details  Name: April Mueller MRN: [id number] Date of Birth: October 03, 1945  Clinical Social Work is seeking post-discharge placement for this patient at the [place] level of care (*CSW will initial, date and re-position this form in  chart as items are completed):  Yes   Patient/family provided with [place] Work Department's list of facilities offering this level of care within the geographic area requested by the patient (or if unable, by the patient's family).  Yes   Patient/family informed of their freedom to choose among providers that offer the needed level of care, that participate in Medicare, Medicaid or managed care program needed by the patient, have an available bed and are willing to accept the patient.  Yes   Patient/family informed of [facility]'s ownership interest in [facility] and [facility], as well as of the fact that they are under no obligation to receive care at these facilities.  PASRR submitted to EDS on 07/18/16     PASRR number received on 07/18/16     Existing PASRR number confirmed on       FL2 transmitted to all facilities in geographic area requested by pt/family on 07/18/16     FL2 transmitted to all facilities within larger geographic area on       Patient informed that his/her managed care company has contracts with or will negotiate with certain facilities, including the following:            Patient/family informed of bed offers received.  Patient chooses bed at       Physician recommends and patient chooses bed at      Patient to be transferred to   on  .  Patient to be transferred to facility by       Patient family notified on   of transfer.  Name of family member notified:        PHYSICIAN Please sign FL2     Additional Comment:    _______________________________________________ Candie Chroman, LCSW 07/18/2016,  12:34 PM

## 2016-07-18 NOTE — Progress Notes (Signed)
Report called to [facility].

## 2016-07-18 NOTE — Progress Notes (Signed)
Called respiratory to notify of pt location, and need for CPAP.

## 2016-07-18 NOTE — Plan of Care (Signed)
Problem: Nutrition: Goal: Adequate nutrition will be maintained Outcome: Progressing Still have poor appetite.  Problem: Health Behavior/Discharge Planning: Goal: Ability to manage health-related needs will improve for discharge Outcome: Progressing Prefers to go to SNF upon discharge for rehab.

## 2016-07-18 NOTE — Clinical Social Work Note (Signed)
Clinical Social Work Assessment  Patient Details  Name: April Mueller MRN: [id number] Date of Birth: 1946/05/16  Date of referral:  07/18/16               Reason for consult:  Facility Placement, Discharge Planning                Permission sought to share information with:  Facility Sport and exercise psychologist, Family Supports Permission granted to share information::  Yes, Verbal Permission Granted  Name::     Alexxa Sabet  Agency::  SNF's  Relationship::  Son  Contact Information:  [phone]  Housing/Transportation Living arrangements for the past 2 months:  Apartment Source of Information:  Patient, Medical Team Patient Interpreter Needed:  None Criminal Activity/Legal Involvement Pertinent to Current Situation/Hospitalization:  No - Comment as needed Significant Relationships:  Adult Children, Neighbor, Other Family Members Lives with:  Self Do you feel safe going back to the place where you live?  Yes Need for family participation in patient care:  Yes (Comment)  Care giving concerns:  PT recommending SNF once medically stable.   Social Worker assessment / plan:  CSW met with patient. RNs at bedside. CSW introduced role and explained that PT recommendations would be discussed. Patient agreeable to SNF placement. No preference on facility. She is not familiar with any of them because she just recently moved to [place]. No further concerns. CSW encouraged patient to contact CSW as needed. CSW will continue to follow patient for support and facilitate discharge to SNF once medically stable.  Employment status:  Retired Nurse, adult PT Recommendations:  [place] / Referral to community resources:  [place]  Patient/Family's Response to care:  Patient agreeable to SNF placement. Patient's family and friends supportive and involved in patient's care. Patient appreciated social work  intervention.  Patient/Family's Understanding of and Emotional Response to Diagnosis, Current Treatment, and Prognosis:  Patient understands and is agreeable to discharge plan. Patient appears anxious after just transferring to the floor from the ICU.  Emotional Assessment Appearance:  Appears stated age Attitude/Demeanor/Rapport:  Other (Pleasant) Affect (typically observed):  Accepting, Anxious, Pleasant Orientation:  Oriented to Self, Oriented to Place, Oriented to  Time, Oriented to Situation Alcohol / Substance use:  Never Used Psych involvement (Current and /or in the community):  No (Comment)  Discharge Needs  Concerns to be addressed:  Care Coordination Readmission within the last 30 days:  No Current discharge risk:  Dependent with Mobility, Lives alone Barriers to Discharge:  Ship broker, Continued Medical Work up   Candie Chroman, LCSW 07/18/2016, 12:31 PM

## 2016-07-18 NOTE — Progress Notes (Signed)
PROGRESS NOTE    April Mueller  [id number] DOB: 03/29/1946 DOA: 07/12/2016 PCP: Melinda Crutch, MD    Brief Narrative:  70 year old female with past medical history significant for CKD stage III, COPD, chronic respiratory failure, pulmonary hypertension group 2. Pt presented to hospital 07/12/16 with fevers, chills, nausea and poor by mouth intake for 2 days prior to this admission. She also had shortness of breath, dizziness and lightheadedness. Critical care was consulted by ED physician due to concern for volume status with sepsis and respiratory failure. She was started on empiric antibiotics while awaiting culture results. Patient subsequently found to have Escherichia coli UTI and bacteremia.  TRH assumed care 07/16/2016.   Assessment & Plan:   Active Problems:   Secondary pulmonary arterial hypertension   Rheumatoid arthritis (HCC)   COPD   OSA on CPAP   Chronic diastolic heart failure (HCC)   Chronic respiratory failure with hypoxia (HCC)   Normocytic anemia   CKD (chronic kidney disease) stage 3, GFR 30-59 ml/min   HTN (hypertension)   Myelodysplastic syndrome (HCC)   Persistent atrial fibrillation (HCC)   Sepsis (HCC)   Thrombocytopenia (HCC)   Urinary tract infection with hematuria   Fever   Hypotension   Acute on chronic respiratory failure with hypoxia (HCC)    Sepsis secondary to Escherichia coli UTI and Escherichia coli bacteremia / Leukocytosis  - Blood cx and urine cx from 12/7 are growing E.Coli - Continue Rocephin  - Hemodynamically stable    Acute on chronic diastolic CHF - 2-D echo with ejection fraction 55%, mild MR, mildly decreased RV systolic function - Per cardiology continue PO diuretics 80mg  torsemide qAM and 60mg  qPM - Continue daily weight and strict intake and output - Weight unchanged since yesterday  - HF team is following       Rheumatoid arthritis ([place]) - On Plaquenil at home    Acute on chronic respiratory failure with hypoxia  (HCC) / COPD, OSA on CPAP / Secondary pulmonary arterial hypertension - BiPAP intermittently. Currently on nasal cannula oxygen - Baseline is 3-4L [place] - Respiratory status stable      Acute renal failure superimposed on CKD (chronic kidney disease) stage 3, GFR 30-59 ml/min - Baseline creatinine between 2 and 2.2 - Creatinine slightly improved this am to 2.17 - Monitor renal function daily since patient on Lasix    Troponin elevation - Likely demand ischemia due to CKD, sepsis - Cardio following     Dyslipidemia - Continue atorvastatin     Hypokalemia - Due to Laisx  - Continue to supplement with 72mEq BID - Follow up BMP in am     HTN (hypertension), essential  - On IV lasix     Persistent atrial fibrillation (HCC) - CHADS vasc 4 - Has been taken off of Eliquis in past by cardio in the setting of GI bleed  - Low dose Toprol XL 12.5mg  daily started      Anemia of chronic kidney disease / IDA - Hemoglobin stable - Pt follows with Dr. Irene Limbo of hematology - She was taken off of Eliquis in the setting of chronic GI bleed     Thrombocytopenia ([place]) / Myelodysplastic syndrome ([place]) - Likely related to MDS -  She follows with Dr. Irene Limbo of hematology/ oncology    DVT prophylaxis: SCD's bilaterally  Code Status: full code  Family Communication: no family at the bedside this am Disposition Plan: home once cleared by cardiology, will order SW consult for placement and transfer  to telemetry today   Consultants:   Cardiology  Procedures:   Intermittent BiPap  Antimicrobials:   Rocephin 12/8>   Vanco, Zosyn 12/7, 12/8  Meropenem 12/8   Subjective: Patient is sitting up about to eat breakfast.  She states that her appetite has been poor since admission.  She would be open to considering CIR here in the hospital or another short term rehab facility as she does not feel she will be ready to discharge home at time of discharge.  Objective: Vitals:   07/18/16  0600 07/18/16 0700 07/18/16 0754 07/18/16 0800  BP: 107/78 102/70  109/71  Pulse: 88 88  85  Resp: (!) 26 (!) 22  (!) 24  Temp:   98 F (36.7 C)   TempSrc:      SpO2: 94% 95%  95%  Weight:      Height:        Intake/Output Summary (Last 24 hours) at 07/18/16 0844 Last data filed at 07/17/16 2018  Gross per 24 hour  Intake              110 ml  Output              250 ml  Net             -140 ml   Filed Weights   07/17/16 0407 07/17/16 0759 07/18/16 0500  Weight: 94.6 kg (208 lb 8.9 oz) 90.2 kg (198 lb 13.7 oz) 90 kg (198 lb 6.4 oz)    Examination:  General exam: Appears calm and comfortable  Respiratory system: Respiratory effort normal. Few crackles at bases Cardiovascular system: S1 & S2 heard, IRR. III/VI systolic murmur best heard in mitral area. Gastrointestinal system: Abdomen is nondistended, soft and nontender. No organomegaly or masses felt. Normal bowel sounds heard. Central nervous system: Alert and oriented. No focal neurological deficits. Extremities: Symmetric 5 x 5 power. Skin: No rashes, lesions or ulcers Psychiatry: Judgement and insight appear normal. Mood & affect appropriate.     Data Reviewed: I have personally reviewed following labs and imaging studies  CBC:  Recent Labs Lab 07/12/16 1933 07/13/16 0446 07/14/16 0210 07/16/16 0440 07/17/16 0246 07/18/16 0216  WBC 21.3* 20.2* 12.8* 10.4 10.4 11.7*  NEUTROABS 19.5* 18.6*  --  8.4* 8.2* 9.4*  HGB 10.4* 10.8* 10.4* 10.6* 10.5* 10.5*  HCT 33.9* 35.7* 34.2* 34.7* 34.1* 33.9*  MCV 98.0 98.1 97.2 96.1 94.2 94.7  PLT 75* 73* 59* 75* 74* 94*   Basic Metabolic Panel:  Recent Labs Lab 07/13/16 0446 07/13/16 1650 07/14/16 0210 07/15/16 0241 07/16/16 0440 07/17/16 0246 07/18/16 0216  NA 137 135 136  --  139 140 140  K 4.6 4.7 4.5  --  3.1* 3.0* 3.4*  CL 104 102 103  --  99* 98* 98*  CO2 24 20* 25  --  30 31 31   GLUCOSE 96 85 127*  --  100* 117* 140*  BUN 48* 46* 53*  --  68* 68* 67*    CREATININE 2.99* 2.89* 2.76*  --  2.43* 2.42* 2.17*  CALCIUM 10.1 10.2 9.8  --  9.3 9.2 9.6  MG 1.9  --   --  2.1 2.1 2.1 1.9  PHOS  --   --   --  4.3 3.1 3.1 2.4*   GFR: Estimated Creatinine Clearance: 26.9 mL/min (by C-G formula based on SCr of 2.17 mg/dL (H)). Liver Function Tests:  Recent Labs Lab 07/12/16 1446 07/13/16 0446 07/14/16 0210  AST  20 18 19   ALT 14 15 15   ALKPHOS 107 88 74  BILITOT 0.9 1.1 0.6  PROT 7.3 7.1 6.7  ALBUMIN 3.1* 2.7* 2.6*    Recent Labs Lab 07/12/16 1446 07/13/16 1650  LIPASE 15 14   No results for input(s): AMMONIA in the last 168 hours. Coagulation Profile:  Recent Labs Lab 07/12/16 1933 07/14/16 0210  INR 1.41 1.26   Cardiac Enzymes:  Recent Labs Lab 07/13/16 0646 07/13/16 1204 07/13/16 1752  TROPONINI 0.05* 0.05* 0.06*   BNP (last 3 results) No results for input(s): PROBNP in the last 8760 hours. HbA1C: No results for input(s): HGBA1C in the last 72 hours. CBG: No results for input(s): GLUCAP in the last 168 hours. Lipid Profile: No results for input(s): CHOL, HDL, LDLCALC, TRIG, CHOLHDL, LDLDIRECT in the last 72 hours. Thyroid Function Tests: No results for input(s): TSH, T4TOTAL, FREET4, T3FREE, THYROIDAB in the last 72 hours. Anemia Panel: No results for input(s): VITAMINB12, FOLATE, FERRITIN, TIBC, IRON, RETICCTPCT in the last 72 hours. Sepsis Labs:  Recent Labs Lab 07/12/16 1447 07/12/16 1716 07/12/16 1933 07/13/16 1650  PROCALCITON  --   --  40.47  --   LATICACIDVEN 1.96* 1.23  --  1.7    Recent Results (from the past 240 hour(s))  Culture, blood (Routine x 2)     Status: Abnormal   Collection Time: 07/12/16  2:37 PM  Result Value Ref Range Status   Specimen Description BLOOD RIGHT ANTECUBITAL  Final   Special Requests BOTTLES DRAWN AEROBIC AND ANAEROBIC 5CC  Final   Culture  Setup Time   Final    GRAM NEGATIVE RODS IN BOTH AEROBIC AND ANAEROBIC BOTTLES Organism ID to follow CRITICAL RESULT CALLED  TO, READ BACK BY AND VERIFIED WITH: GREG ABBOTT,PHARMD @0717  07/13/16 MKELLY,MLT    Culture ESCHERICHIA COLI (A)  Final   Report Status 07/15/2016 FINAL  Final   Organism ID, Bacteria ESCHERICHIA COLI  Final      Susceptibility   Escherichia coli - MIC*    AMPICILLIN <=2 SENSITIVE Sensitive     CEFAZOLIN <=4 SENSITIVE Sensitive     CEFEPIME <=1 SENSITIVE Sensitive     CEFTAZIDIME <=1 SENSITIVE Sensitive     CEFTRIAXONE <=1 SENSITIVE Sensitive     CIPROFLOXACIN <=0.25 SENSITIVE Sensitive     GENTAMICIN <=1 SENSITIVE Sensitive     IMIPENEM <=0.25 SENSITIVE Sensitive     TRIMETH/SULFA <=20 SENSITIVE Sensitive     AMPICILLIN/SULBACTAM <=2 SENSITIVE Sensitive     PIP/TAZO <=4 SENSITIVE Sensitive     Extended ESBL NEGATIVE Sensitive     * ESCHERICHIA COLI  Blood Culture ID Panel (Reflexed)     Status: Abnormal   Collection Time: 07/12/16  2:37 PM  Result Value Ref Range Status   Enterococcus species NOT DETECTED NOT DETECTED Final   Listeria monocytogenes NOT DETECTED NOT DETECTED Final   Staphylococcus species NOT DETECTED NOT DETECTED Final   Staphylococcus aureus NOT DETECTED NOT DETECTED Final   Streptococcus species NOT DETECTED NOT DETECTED Final   Streptococcus agalactiae NOT DETECTED NOT DETECTED Final   Streptococcus pneumoniae NOT DETECTED NOT DETECTED Final   Streptococcus pyogenes NOT DETECTED NOT DETECTED Final   Acinetobacter baumannii NOT DETECTED NOT DETECTED Final   Enterobacteriaceae species DETECTED (A) NOT DETECTED Final    Comment: CRITICAL RESULT CALLED TO, READ BACK BY AND VERIFIED WITH: GREG ABBOTT, PHARMD @0717  07/13/16 MKELLY,MLT    Enterobacter cloacae complex NOT DETECTED NOT DETECTED Final   Escherichia coli DETECTED (  A) NOT DETECTED Final    Comment: CRITICAL RESULT CALLED TO, READ BACK BY AND VERIFIED WITH: GREG ABBOTT, PHARMD @0717  07/13/16 MKELLY,MLT    Klebsiella oxytoca NOT DETECTED NOT DETECTED Final   Klebsiella pneumoniae NOT DETECTED NOT  DETECTED Final   Proteus species NOT DETECTED NOT DETECTED Final   Serratia marcescens NOT DETECTED NOT DETECTED Final   Carbapenem resistance NOT DETECTED NOT DETECTED Final   Haemophilus influenzae NOT DETECTED NOT DETECTED Final   Neisseria meningitidis NOT DETECTED NOT DETECTED Final   Pseudomonas aeruginosa NOT DETECTED NOT DETECTED Final   Candida albicans NOT DETECTED NOT DETECTED Final   Candida glabrata NOT DETECTED NOT DETECTED Final   Candida krusei NOT DETECTED NOT DETECTED Final   Candida parapsilosis NOT DETECTED NOT DETECTED Final   Candida tropicalis NOT DETECTED NOT DETECTED Final  Culture, blood (Routine x 2)     Status: Abnormal   Collection Time: 07/12/16  3:12 PM  Result Value Ref Range Status   Specimen Description BLOOD RIGHT FOREARM  Final   Special Requests BOTTLES DRAWN AEROBIC AND ANAEROBIC 5CC  Final   Culture  Setup Time   Final    GRAM NEGATIVE RODS IN BOTH AEROBIC AND ANAEROBIC BOTTLES CRITICAL VALUE NOTED.  VALUE IS CONSISTENT WITH PREVIOUSLY REPORTED AND CALLED VALUE.    Culture (A)  Final    ESCHERICHIA COLI SUSCEPTIBILITIES PERFORMED ON PREVIOUS CULTURE WITHIN THE LAST 5 DAYS.    Report Status 07/15/2016 FINAL  Final  Urine culture     Status: Abnormal   Collection Time: 07/12/16  3:21 PM  Result Value Ref Range Status   Specimen Description URINE, CLEAN CATCH  Final   Special Requests NONE  Final   Culture >=100,000 COLONIES/mL ESCHERICHIA COLI (A)  Final   Report Status 07/14/2016 FINAL  Final   Organism ID, Bacteria ESCHERICHIA COLI (A)  Final      Susceptibility   Escherichia coli - MIC*    AMPICILLIN <=2 SENSITIVE Sensitive     CEFAZOLIN <=4 SENSITIVE Sensitive     CEFTRIAXONE <=1 SENSITIVE Sensitive     CIPROFLOXACIN <=0.25 SENSITIVE Sensitive     GENTAMICIN <=1 SENSITIVE Sensitive     IMIPENEM <=0.25 SENSITIVE Sensitive     NITROFURANTOIN <=16 SENSITIVE Sensitive     TRIMETH/SULFA <=20 SENSITIVE Sensitive     AMPICILLIN/SULBACTAM  <=2 SENSITIVE Sensitive     PIP/TAZO <=4 SENSITIVE Sensitive     Extended ESBL NEGATIVE Sensitive     * >=100,000 COLONIES/mL ESCHERICHIA COLI  MRSA PCR Screening     Status: None   Collection Time: 07/12/16  6:53 PM  Result Value Ref Range Status   MRSA by PCR NEGATIVE NEGATIVE Final    Comment:        The GeneXpert MRSA Assay (FDA approved for NASAL specimens only), is one component of a comprehensive MRSA colonization surveillance program. It is not intended to diagnose MRSA infection nor to guide or monitor treatment for MRSA infections.   Culture, sputum-assessment     Status: None   Collection Time: 07/13/16  6:29 AM  Result Value Ref Range Status   Specimen Description EXPECTORATED SPUTUM  Final   Special Requests NONE  Final   Sputum evaluation   Final    Sputum specimen not acceptable for testing.  Please recollect.   RESULT CALLED TO, READ BACK BY AND VERIFIED WITH: Luella Cook, NURSE AT [place] ON [id number] BY Rhea Bleacher    Report Status 07/13/2016 FINAL  Final  Radiology Studies: No results found.      Scheduled Meds: . allopurinol  300 mg Oral Daily  . atorvastatin  10 mg Oral Daily  . cefTRIAXone (ROCEPHIN)  IV  2 g Intravenous Q24H  . hydroxychloroquine  200 mg Oral BID  . metoprolol succinate  12.5 mg Oral Daily  . neomycin-polymyxin b-dexamethasone  1 application Both Eyes QHS  . omega-3 acid ethyl esters  1 g Oral Daily  . polyvinyl alcohol  1 drop Both Eyes QHS  . potassium chloride  40 mEq Oral BID  . sodium chloride flush  3 mL Intravenous Q12H  . torsemide  60 mg Oral q1800  . torsemide  80 mg Oral Daily   Continuous Infusions:   LOS: 6 days    Time spent: 30 minutes    Loretha Stapler, MD [facility] Pager [phone]  If 7PM-7AM, please contact night-coverage [email or web address] Password [facility] 07/18/2016, 8:44 AM

## 2016-07-18 NOTE — Progress Notes (Signed)
Advanced Heart Failure Rounding Note   Subjective:     70 y.o. 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   Admitted with ecoli urosespis. WBC 17.5. Procalcitonin 40.47.Transferred to ICU on 12/8 for respiratory failure and volume overload.   Yesterday transitioned to po diuretics. Weight unchanged. Able to get OOB to the chair. PT recommending SNF.    Remains SOB with exertion.   Objective:   Weight Range:  Vital Signs:   Temp:  [97.7 F (36.5 C)-98.6 F (37 C)] 98.3 F (36.8 C) (12/13 0401) Pulse Rate:  [77-97] 88 (12/13 0700) Resp:  [22-36] 22 (12/13 0700) BP: (81-108)/(50-78) 102/70 (12/13 0700) SpO2:  [92 %-99 %] 95 % (12/13 0700) Weight:  [198 lb 6.4 oz (90 kg)-198 lb 13.7 oz (90.2 kg)] 198 lb 6.4 oz (90 kg) (12/13 0500) Last BM Date: 07/17/16  Weight change: Filed Weights   07/17/16 0407 07/17/16 0759 07/18/16 0500  Weight: 208 lb 8.9 oz (94.6 kg) 198 lb 13.7 oz (90.2 kg) 198 lb 6.4 oz (90 kg)    Intake/Output:   Intake/Output Summary (Last 24 hours) at 07/18/16 0734 Last data filed at 07/17/16 2018  Gross per 24 hour  Intake              350 ml  Output              250 ml  Net              100 ml     Physical Exam: General:   In bed. 6L [place]  Resp status stable.   HEENT: Normal Neck: supple. JVP 7-8 cm.  Carotids 2+ bilat; no bruits. No lymphadenopathy or thyromegaly appreciated. Cor: PMI nondisplaced. Irregular. 3/6 MR.  Lungs: Diminished throughout. Clear anteriorly  Abdomen: soft, NT, ND, no HSM. No bruits or masses. +BS  Extremities: no cyanosis, clubbing, rash. No edema. .  Neuro: alert & orientedx3, cranial nerves grossly intact. moves all 4 extremities w/o difficulty. Affect flat.  Telemetry: Reviewed personally, Afib 80s  Labs: Basic Metabolic Panel:  Recent Labs Lab 07/13/16 0446 07/13/16 1650 07/14/16 0210 07/15/16 0241  07/16/16 0440 07/17/16 0246 07/18/16 0216  NA 137 135 136  --  139 140 140  K 4.6 4.7 4.5  --  3.1* 3.0* 3.4*  CL 104 102 103  --  99* 98* 98*  CO2 24 20* 25  --  30 31 31   GLUCOSE 96 85 127*  --  100* 117* 140*  BUN 48* 46* 53*  --  68* 68* 67*  CREATININE 2.99* 2.89* 2.76*  --  2.43* 2.42* 2.17*  CALCIUM 10.1 10.2 9.8  --  9.3 9.2 9.6  MG 1.9  --   --  2.1 2.1 2.1 1.9  PHOS  --   --   --  4.3 3.1 3.1 2.4*    Liver Function Tests:  Recent Labs Lab 07/12/16 1446 07/13/16 0446 07/14/16 0210  AST 20 18 19   ALT 14 15 15   ALKPHOS 107 88 74  BILITOT 0.9 1.1 0.6  PROT 7.3 7.1 6.7  ALBUMIN 3.1* 2.7* 2.6*    Recent Labs Lab 07/12/16 1446 07/13/16 1650  LIPASE 15 14   No results for input(s): AMMONIA in the last 168 hours.  CBC:  Recent Labs Lab 07/12/16 1933 07/13/16 0446 07/14/16 0210 07/16/16 0440 07/17/16 0246 07/18/16 0216  WBC 21.3* 20.2*  12.8* 10.4 10.4 11.7*  NEUTROABS 19.5* 18.6*  --  8.4* 8.2* 9.4*  HGB 10.4* 10.8* 10.4* 10.6* 10.5* 10.5*  HCT 33.9* 35.7* 34.2* 34.7* 34.1* 33.9*  MCV 98.0 98.1 97.2 96.1 94.2 94.7  PLT 75* 73* 59* 75* 74* 94*    Cardiac Enzymes:  Recent Labs Lab 07/13/16 0646 07/13/16 1204 07/13/16 1752  TROPONINI 0.05* 0.05* 0.06*    BNP: BNP (last 3 results)  Recent Labs  04/02/16 0909 04/11/16 0922 07/12/16 1447  BNP 317.2* 180.4* 318.6*    ProBNP (last 3 results) No results for input(s): PROBNP in the last 8760 hours.    Other results:  Imaging: No results found.   Medications:     Scheduled Medications: . allopurinol  300 mg Oral Daily  . atorvastatin  10 mg Oral Daily  . cefTRIAXone (ROCEPHIN)  IV  2 g Intravenous Q24H  . hydroxychloroquine  200 mg Oral BID  . neomycin-polymyxin b-dexamethasone  1 application Both Eyes QHS  . omega-3 acid ethyl esters  1 g Oral Daily  . polyvinyl alcohol  1 drop Both Eyes QHS  . sodium chloride flush  3 mL Intravenous Q12H  . torsemide  60 mg Oral q1800  .  torsemide  80 mg Oral Daily    Infusions:   PRN Medications: acetaminophen, albuterol, metoprolol, ondansetron **OR** ondansetron (ZOFRAN) IV   Assessment:   1. Sepsis 2/2 E-Coli Bacteremia due to UTI.  UCx and BCx both positive for E-Coli - Continue Ceftriaxone per primary.  Got Vanc/Zosyn on admission.  - CT ABD/Pelvis with scattered hepatic cysts (increased in size from prior exam), bilateral renal cysts, ?inflamatory changes around head of pancreas, Normal appendix, no acute colitis or diverticulitis.  - CCM following. 2. Acute on Chronic Diastolic CHF: Echo [id number] with EF 55-60%, mild MR, PASP 66 mmHg, mildly decreased RV systolic function. Creatinine 2.1 BUN 67.  Volume status stable. Continue torsemide 80 mg am and 60 mg in pm.  - Need strict I/Os.  3. Acute on chronic hypoxic respiratory failure: Baseline COPD and restriction from body habitus, on 3-4L home oxygen.  Suspect pulmonary edema as cause for worsening.  Much improved after diuresis.  - Currently on 6 liters oxygen.   - Diuretics as above - CCM following as above. 4. ARF on CKD stage III:  Improving. Creatinine 2.4>2.1 Baseline creatinine 2.0-2.2 5. MR: Suspected to be severe in past but more recently has seemed less significant despite murmur.  Mild to moderate MR by Echo 1/17, echo this admission with mild MR.  6. Atrial Fib: Chronic.  Rate back down with treatment of sepsis.  Currently off Toprol XL.  - Not AC candidate with recurrent severe GI bleeding.  7. PAH: primarily [place] group 2 based on [place] 3/17.  Not on pulmonary vasodilators.  8. Hypokalemia: Supplement potassium.  9. Deconditioning: PT recommending SNF  We will set up HF follow up for next week.   Length of Stay: [place] NP-C 07/18/2016, 7:34 AM  Advanced Heart Failure Team Pager [phone] (M-F; 7a - 4p)  Please contact [facility] for night-coverage after hours (4p -7a ) and weekends on [email or web address]  Patient seen with NP, agree with the  above note.  She is improving but still weak.  Continue po torsemide, will add back a low dose of Toprol XL, 12.5 mg daily.  She could go to telemetry, to SNF soon.   [name] 07/18/2016 8:09 AM

## 2016-07-18 NOTE — Progress Notes (Signed)
Occupational Therapy Evaluation Patient Details Name: SHAKARRA DUNKLE MRN: [id number] DOB: 01/19/1946 Today's Date: 07/18/2016    History of Present Illness Pt adm with sepsis due to UTI and acute on chronic diastolic heart failure. PMH - chronic resp failure, ckd, copd, chf   Clinical Impression   PTA, pt lived alone and was modified independent with mobility and had PCA who assisted as needed with IADL tasks. Pt demonstrates a functional decline with ADL and will benefit from rehab at SNF to facilitate return to PLOF. Pt states she would like to go to [facility]. Will follow acutely to address established goals.     Follow Up Recommendations  SNF;Supervision/Assistance - 24 hour    Equipment Recommendations  None recommended by OT    Recommendations for Other Services       Precautions / Restrictions Precautions Precautions: Fall Precaution Comments: At baseline pt incr supplemental O2 with activity      Mobility Bed Mobility               General bed mobility comments: Pt up in recliner  Transfers Overall transfer level: Needs assistance   Transfers: Sit to/from Stand Sit to Stand: Min assist              Balance     Sitting balance-Leahy Scale: Good       Standing balance-Leahy Scale: Fair                              ADL Overall ADL's : Needs assistance/impaired Eating/Feeding: Modified independent   Grooming: Set up   Upper Body Bathing: Set up;Sitting   Lower Body Bathing: Minimal assistance;Sit to/from stand   Upper Body Dressing : Set up;Sitting   Lower Body Dressing: Moderate assistance;Sit to/from stand   Toilet Transfer: Minimal assistance   Toileting- Clothing Manipulation and Hygiene: Set up;Supervision/safety       Functional mobility during ADLs: Minimal assistance;Rolling walker General ADL Comments: Fatigues eaily with minimal activity     Vision     Perception     Praxis      Pertinent  Vitals/Pain Pain Assessment: No/denies pain     Hand Dominance Right   Extremity/Trunk Assessment Upper Extremity Assessment Upper Extremity Assessment: Generalized weakness   Lower Extremity Assessment Lower Extremity Assessment: Generalized weakness   Cervical / Trunk Assessment Cervical / Trunk Assessment: Normal   Communication Communication Communication: No difficulties   Cognition Arousal/Alertness: Awake/alert Behavior During Therapy: WFL for tasks assessed/performed Overall Cognitive Status: Within Functional Limits for tasks assessed                     General Comments       Exercises       Shoulder Instructions      Home Living Family/patient expects to be discharged to:: Skilled nursing facility                                        Prior Functioning/Environment Level of Independence: Needs assistance;Independent with assistive device(s)  Gait / Transfers Assistance Needed: ambulated without AD unless she had a "bad day", then she used her rollator ADL's / Homemaking Assistance Needed: Has aid to assist with bathing and housework as needed   Comments: uses scat transportation to go grocery shopping  OT Problem List: Decreased strength;Decreased activity tolerance;Impaired balance (sitting and/or standing);Decreased knowledge of use of DME or AE;Cardiopulmonary status limiting activity   OT Treatment/Interventions: Self-care/ADL training;Therapeutic exercise;Energy conservation;DME and/or AE instruction;Therapeutic activities;Patient/family education;Balance training    OT Goals(Current goals can be found in the care plan section) Acute Rehab OT Goals Patient Stated Goal: go to rehab to get stronger prior to returning home OT Goal Formulation: With patient Time For Goal Achievement: 08/01/16 Potential to Achieve Goals: Good ADL Goals Pt Will Perform Lower Body Bathing: with modified independence;sit to/from stand Pt  Will Perform Lower Body Dressing: with modified independence;sit to/from stand Pt Will Perform Toileting - Clothing Manipulation and hygiene: with modified independence;sit to/from stand;sitting/lateral leans Additional ADL Goal #1: Pt will independently verbalize 3 energy consrvation techniques for ADL  OT Frequency: Min 2X/week   Barriers to D/C: Decreased caregiver support          Co-evaluation              End of Session Nurse Communication: Mobility status  Activity Tolerance: Patient tolerated treatment well Patient left: in chair;with call bell/phone within reach   Time: 1714-1736 OT Time Calculation (min): 22 min Charges:  OT General Charges $OT Visit: 1 Procedure OT Evaluation $OT Eval Moderate Complexity: 1 Procedure G-Codes:    Aime Meloche,HILLARY Jul 24, 2016, 5:43 PM   [facility], OT/L  [id number] 07-24-2016

## 2016-07-18 NOTE — Progress Notes (Signed)
Physical Therapy Treatment Patient Details Name: April Mueller MRN: [id number] DOB: 09/17/1945 Today's Date: 07/18/2016    History of Present Illness Pt adm with sepsis due to UTI and acute on chronic diastolic heart failure. PMH - chronic resp failure, ckd, copd, chf    PT Comments    Pt presented sitting OOB in recliner when PT entered room. Pt making slow progress with mobility and fatigues quickly, continuing to require standing rest breaks and increase in supplemental O2. Pt's SPO2 decreased to 86% during ambulation but quickly returned to 90% with standing rest breaks. Pt would continue to benefit from skilled physical therapy services at this time while admitted and after d/c to address her limitations in order to improve her overall safety and independence with functional mobility.   Follow Up Recommendations  SNF     Equipment Recommendations  None recommended by PT    Recommendations for Other Services       Precautions / Restrictions Precautions Precautions: Fall Precaution Comments: At baseline pt incr supplemental O2 with activity Restrictions Weight Bearing Restrictions: No    Mobility  Bed Mobility               General bed mobility comments: Pt up in recliner  Transfers Overall transfer level: Needs assistance Equipment used: 4-wheeled walker Transfers: Sit to/from Stand Sit to Stand: Min guard         General transfer comment: pt required increased time and min guard for safety  Ambulation/Gait Ambulation/Gait assistance: Min guard Ambulation Distance (Feet): 150 Feet Assistive device: 4-wheeled walker Gait Pattern/deviations: Step-through pattern;Decreased step length - right;Decreased step length - left;Trunk flexed Gait velocity: decreased Gait velocity interpretation: Below normal speed for age/gender General Gait Details: Min guard for safety, no physical assistance needed. Pt with two standing rest breaks with forearms propped on  rollator in tripod position. Pt ambulated on 6L of O2. SpO2 decreased to 86% during ambulation but quickly returned to 90% with standing rest breaks.   Stairs            Wheelchair Mobility    Modified Rankin (Stroke Patients Only)       Balance Overall balance assessment: Needs assistance Sitting-balance support: No upper extremity supported;Feet supported Sitting balance-Leahy Scale: Good     Standing balance support: Single extremity supported Standing balance-Leahy Scale: Poor Standing balance comment: pt requires on UE support and supervision with static standing                    Cognition Arousal/Alertness: Awake/alert Behavior During Therapy: WFL for tasks assessed/performed Overall Cognitive Status: Within Functional Limits for tasks assessed                      Exercises      General Comments        Pertinent Vitals/Pain Pain Assessment: No/denies pain    Home Living                      Prior Function            PT Goals (current goals can now be found in the care plan section) Acute Rehab PT Goals Patient Stated Goal: go to rehab to get stronger prior to returning home PT Goal Formulation: With patient Time For Goal Achievement: 07/31/16 Potential to Achieve Goals: Good Progress towards PT goals: Progressing toward goals    Frequency    Min 3X/week      PT  Plan Current plan remains appropriate    Co-evaluation             End of Session Equipment Utilized During Treatment: Gait belt;Oxygen;Other (comment) (3L of O2 at rest; 6L of O2 with ambulation) Activity Tolerance: Patient limited by fatigue Patient left: in chair;with call bell/phone within reach     Time: 0939-1012 PT Time Calculation (min) (ACUTE ONLY): 33 min  Charges:  $Gait Training: 23-37 mins                    G CodesClearnce Sorrel Niara Bunker August 02, 2016, 10:29 AM Sherie Don, PT, DPT [phone]

## 2016-07-18 NOTE — Progress Notes (Signed)
Set pt up on Cpap 6.0 H20.  Pt is using her nasal pillows from home.  Has 3 lpm bled in.  Tolerating well.  No issues to report at this time.

## 2016-07-18 NOTE — NC FL2 (Signed)
[place] LEVEL OF CARE SCREENING TOOL     IDENTIFICATION  Patient Name: April Mueller Birthdate: Apr 25, 1946 Sex: female Admission Date (Current Location): 07/12/2016  [facility] and [place] Number:  Herbalist and Address:  [facility], [place] [address] [place], [place] [address]      Provider Number: [id number]  Attending Physician Name and Address:  Eber Jones, MD  Relative Name and Phone Number:       Current Level of Care: Hospital Recommended Level of Care: [place] Prior Approval Number:    Date Approved/Denied:   PASRR Number: [id number]  Discharge Plan: SNF    Current Diagnoses: Patient Active Problem List   Diagnosis Date Noted  . Sepsis due to Escherichia coli ([place])   . Acute on chronic respiratory failure with hypoxia ([place])   . Sepsis ([place]) 07/12/2016  . Thrombocytopenia ([place]) 07/12/2016  . Urinary tract infection with hematuria   . Fever   . Hypotension   . Iron deficiency anemia due to chronic blood loss 11/03/2015  . Pulmonary hypertension 10/22/2015  . Chronic diastolic CHF (congestive heart failure) ([place]) 09/26/2015  . Acute on chronic diastolic CHF (congestive heart failure) ([place])   . Persistent atrial fibrillation ([place])   . History of colonic polyps   . Benign neoplasm of cecum   . Benign neoplasm of ascending colon   . Heme positive stool   . Anemia 08/17/2015  . Symptomatic anemia 08/16/2015  . HTN (hypertension) 08/16/2015  . Leukocytosis 08/16/2015  . Acute hyperkalemia 08/16/2015  . 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 Anemia of chronic disease 04/26/2015  . CKD (chronic kidney disease) stage 3, GFR 30-59 ml/min 04/22/2015  . Normocytic anemia 04/04/2015  . Morbid obesity ([place]) 01/22/2015  . Mitral regurgitation 12/29/2014  . Dyspnea 08/18/2014  . Chronic respiratory failure with hypoxia ([place]) 08/09/2014  . Chronic diastolic heart failure ([place])  04/28/2014  . 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 Rheumatoid arthritis ([place]) 11/06/2012  . COPD 11/06/2012  . OSA on CPAP 11/06/2012    Orientation RESPIRATION BLADDER Height & Weight     Self, Time, Situation, Place  O2 (Nasal canula 3 L) Incontinent Weight: 198 lb 6.4 oz (90 kg) Height:  5\' 4"  (162.6 cm)  BEHAVIORAL SYMPTOMS/MOOD NEUROLOGICAL BOWEL NUTRITION STATUS   (None)  (None) Continent Diet (Heart healthy)  AMBULATORY STATUS COMMUNICATION OF NEEDS Skin   Limited Assist Verbally Other (Comment) (MASD)                       Personal Care Assistance Level of Assistance  Bathing, Feeding, Dressing Bathing Assistance: Limited assistance Feeding assistance: Independent Dressing Assistance: Limited assistance     Functional Limitations Info  Sight, Hearing, Speech Sight Info: Adequate Hearing Info: Adequate Speech Info: Adequate    SPECIAL CARE FACTORS FREQUENCY  Blood pressure, PT (By licensed PT), OT (By licensed OT)     PT Frequency: 5 x week OT Frequency: 5 x week            Contractures Contractures Info: Not present    Additional Factors Info  Code Status, Allergies Code Status Info: Full Allergies Info: NKDA           Current Medications (07/18/2016):  This is the current hospital active medication list Current Facility-Administered Medications  Medication Dose Route Frequency Provider Last Rate Last Dose  . acetaminophen (TYLENOL) tablet 650 mg  650 mg Oral Q6H  PRN Robbie Lis, MD      . albuterol (PROVENTIL) (2.5 MG/3ML) 0.083% nebulizer solution 2.5 mg  2.5 mg Nebulization Q4H PRN Gardiner Barefoot, NP      . allopurinol (ZYLOPRIM) tablet 300 mg  300 mg Oral Daily Amy D Clegg, NP   300 mg at 07/18/16 1026  . atorvastatin (LIPITOR) tablet 10 mg  10 mg Oral Daily Lavina Hamman, MD   10 mg at 07/18/16 1028  . cefTRIAXone (ROCEPHIN) 2 g in dextrose 5 % 50 mL IVPB  2 g Intravenous Q24H Collene Gobble, MD   2 g at  07/18/16 1026  . hydroxychloroquine (PLAQUENIL) tablet 200 mg  200 mg Oral BID Amy D Clegg, NP   200 mg at 07/18/16 1027  . loperamide (IMODIUM) 1 MG/5ML solution 2 mg  2 mg Oral PRN Eber Jones, MD   2 mg at 07/18/16 1051  . metoprolol (LOPRESSOR) injection 2.5-5 mg  2.5-5 mg Intravenous Q3H PRN Colbert Coyer, MD      . metoprolol succinate (TOPROL-XL) 24 hr tablet 12.5 mg  12.5 mg Oral Daily Larey [place], MD   12.5 mg at 07/18/16 1027  . neomycin-polymyxin b-dexamethasone (MAXITROL) ophthalmic ointment 1 application  1 application Both Eyes QHS Lavina Hamman, MD   1 application at [id number]  . omega-3 acid ethyl esters (LOVAZA) capsule 1 g  1 g Oral Daily Lavina Hamman, MD   1 g at 07/18/16 1027  . ondansetron (ZOFRAN) tablet 4 mg  4 mg Oral Q6H PRN Lavina Hamman, MD       Or  . ondansetron [facility]) injection 4 mg  4 mg Intravenous Q6H PRN Lavina Hamman, MD      . polyvinyl alcohol (LIQUIFILM TEARS) 1.4 % ophthalmic solution 1 drop  1 drop Both Eyes QHS Lavina Hamman, MD   1 drop at 07/17/16 2120  . potassium chloride SA (K-DUR,KLOR-CON) CR tablet 40 mEq  40 mEq Oral BID Amy D Clegg, NP   40 mEq at 07/18/16 1026  . sodium chloride flush (NS) 0.9 % injection 3 mL  3 mL Intravenous Q12H Lavina Hamman, MD   3 mL at 07/18/16 1000  . torsemide (DEMADEX) tablet 60 mg  60 mg Oral q1800 Amy D Clegg, NP   60 mg at 07/17/16 1720  . torsemide (DEMADEX) tablet 80 mg  80 mg Oral Daily Amy D Clegg, NP   80 mg at 07/18/16 1026     Discharge Medications: Please see discharge summary for a list of discharge medications.  Relevant Imaging Results:  Relevant Lab Results:   Additional Information SS#: [id number]  Candie Chroman, LCSW

## 2016-07-19 LAB — CBC WITH DIFFERENTIAL/PLATELET
BASOS PCT: 0 %
Basophils Absolute: 0 10*3/uL (ref 0.0–0.1)
EOS PCT: 2 %
Eosinophils Absolute: 0.2 10*3/uL (ref 0.0–0.7)
HEMATOCRIT: 33.2 % — AB (ref 36.0–46.0)
HEMOGLOBIN: 10.2 g/dL — AB (ref 12.0–15.0)
LYMPHS ABS: 1.3 10*3/uL (ref 0.7–4.0)
Lymphocytes Relative: 12 %
MCH: 29 pg (ref 26.0–34.0)
MCHC: 30.7 g/dL (ref 30.0–36.0)
MCV: 94.3 fL (ref 78.0–100.0)
MONO ABS: 0.8 10*3/uL (ref 0.1–1.0)
Monocytes Relative: 7 %
NEUTROS ABS: 8.7 10*3/uL — AB (ref 1.7–7.7)
Neutrophils Relative %: 79 %
Platelets: 142 10*3/uL — ABNORMAL LOW (ref 150–400)
RBC: 3.52 MIL/uL — ABNORMAL LOW (ref 3.87–5.11)
RDW: 16.4 % — ABNORMAL HIGH (ref 11.5–15.5)
WBC: 11 10*3/uL — ABNORMAL HIGH (ref 4.0–10.5)

## 2016-07-19 LAB — BASIC METABOLIC PANEL
Anion gap: 11 (ref 5–15)
BUN: 64 mg/dL — ABNORMAL HIGH (ref 6–20)
CO2: 32 mmol/L (ref 22–32)
CREATININE: 2.25 mg/dL — AB (ref 0.44–1.00)
Calcium: 9.5 mg/dL (ref 8.9–10.3)
Chloride: 95 mmol/L — ABNORMAL LOW (ref 101–111)
GFR calc non Af Amer: 21 mL/min — ABNORMAL LOW (ref >60)
GFR, EST AFRICAN AMERICAN: 25 mL/min — AB (ref >60)
Glucose, Bld: 119 mg/dL — ABNORMAL HIGH (ref 65–99)
Potassium: 3.8 mmol/L (ref 3.5–5.1)
Sodium: 138 mmol/L (ref 135–145)

## 2016-07-19 MED ORDER — CEPHALEXIN 250 MG PO CAPS
250.0000 mg | ORAL_CAPSULE | Freq: Three times a day (TID) | ORAL | Status: AC
  Administered 2016-07-19 – 2016-07-22 (×9): 250 mg via ORAL
  Filled 2016-07-19 (×11): qty 1

## 2016-07-19 MED ORDER — POTASSIUM CHLORIDE CRYS ER 20 MEQ PO TBCR
40.0000 meq | EXTENDED_RELEASE_TABLET | Freq: Once | ORAL | Status: AC
  Administered 2016-07-19: 40 meq via ORAL
  Filled 2016-07-19: qty 2

## 2016-07-19 MED ORDER — ALPRAZOLAM 0.25 MG PO TABS: 0.2500 mg | ORAL_TABLET | Freq: Two times a day (BID) | ORAL | 0 refills | Status: DC | PRN

## 2016-07-19 MED ORDER — TORSEMIDE 20 MG PO TABS: 60.0000 mg | ORAL_TABLET | Freq: Every day | ORAL | 0 refills | Status: DC

## 2016-07-19 MED ORDER — TORSEMIDE 20 MG PO TABS: 80.0000 mg | ORAL_TABLET | Freq: Every day | ORAL | 0 refills | Status: DC

## 2016-07-19 MED ORDER — METOPROLOL SUCCINATE ER 25 MG PO TB24: 12.5000 mg | ORAL_TABLET | Freq: Every day | ORAL | 0 refills | Status: DC

## 2016-07-19 MED ORDER — CEPHALEXIN 250 MG PO CAPS: 250.0000 mg | ORAL_CAPSULE | Freq: Three times a day (TID) | ORAL | 0 refills | Status: DC

## 2016-07-19 MED ORDER — ALBUTEROL SULFATE (2.5 MG/3ML) 0.083% IN NEBU: 2.5000 mg | INHALATION_SOLUTION | RESPIRATORY_TRACT | 12 refills | Status: DC | PRN

## 2016-07-19 NOTE — Clinical Social Work Note (Addendum)
CSW confirmed patient's interest in [facility]. That referral is still pending. CSW has left a message for admissions coordinator at [facility] to return call regarding referral. CSW confirmed with MD that patient is stable for discharge today.  Dayton Scrape, [place] [phone]  10:34 am Philippa Sicks is covering for admissions coordinator at [facility] today. She will review referral and call CSW back.  Dayton Scrape, [place] [phone]  11:08 am [facility] can extend a bed offer to patient. CSW has faxed clinicals to [facility] for insurance authorization. It should take about 4 hours to get an answer and patient cannot discharge to SNF without authorization or the authorization will be denied.  Dayton Scrape, [place]

## 2016-07-19 NOTE — Progress Notes (Signed)
Patient placed on CPAP for the night without complication. RT will continue to monitor as needed. 

## 2016-07-19 NOTE — Care Management Important Message (Signed)
Important Message  Patient Details  Name: April Mueller MRN: [id number] Date of Birth: Dec 31, 1945   Medicare Important Message Given:  Yes    Adela Esteban 07/19/2016, 10:07 AM

## 2016-07-19 NOTE — Progress Notes (Signed)
Advanced Heart Failure Rounding Note   Subjective:     70 y.o. 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   Admitted with ecoli urosespis. WBC 17.5. Procalcitonin 40.47.Transferred to ICU on 12/8 for respiratory failure and volume overload.   Transitioned to po diuretics 07/17/16.  Weight down 1 lb.   Wants to go today. Got up to toilet without SOB. Going to SNF at [facility].  Creatinine 2.42 -> 2.17 -> 2.25. Baseline ~2.0 - 2.1  Objective:   Weight Range:  Vital Signs:   Temp:  [98.5 F (36.9 C)-98.8 F (37.1 C)] 98.8 F (37.1 C) (12/14 0447) Pulse Rate:  [68-92] 68 (12/14 0447) Resp:  [18-28] 18 (12/14 0447) BP: (93-110)/(58-81) 102/58 (12/14 0447) SpO2:  [95 %-100 %] 95 % (12/14 0447) Weight:  [197 lb 8 oz (89.6 kg)] 197 lb 8 oz (89.6 kg) (12/14 0447) Last BM Date: 07/18/16  Weight change: Filed Weights   07/17/16 0759 07/18/16 0500 07/19/16 0447  Weight: 198 lb 13.7 oz (90.2 kg) 198 lb 6.4 oz (90 kg) 197 lb 8 oz (89.6 kg)    Intake/Output:   Intake/Output Summary (Last 24 hours) at 07/19/16 0803 Last data filed at 07/19/16 0450  Gross per 24 hour  Intake               50 ml  Output              550 ml  Net             -500 ml     Physical Exam: General: Lying in bed. 6L [place]  NAD  HEENT: Normal Neck: supple. JVP ~7-8 cm.  Carotids 2+ bilat; no bruits. No thyromegaly or nodule noted.  Cor: PMI nondisplaced. Irregular. 3/6 MR.  Lungs: Decreased throughout.  Abdomen: soft, NT, ND, no HSM. No bruits or masses. +BS  Extremities: no cyanosis, clubbing, rash. No peripheral edema.  Neuro: alert & orientedx3, cranial nerves grossly intact. moves all 4 extremities w/o difficulty. Affect flat.   Telemetry: Reviewed, Afib 70-80s   Labs: Basic Metabolic Panel:  Recent Labs Lab 07/13/16 0446  07/14/16 0210 07/15/16 0241 07/16/16 0440 07/17/16 0246  07/18/16 0216 07/19/16 0236  NA 137  < > 136  --  139 140 140 138  K 4.6  < > 4.5  --  3.1* 3.0* 3.4* 3.8  CL 104  < > 103  --  99* 98* 98* 95*  CO2 24  < > 25  --  30 31 31  32  GLUCOSE 96  < > 127*  --  100* 117* 140* 119*  BUN 48*  < > 53*  --  68* 68* 67* 64*  CREATININE 2.99*  < > 2.76*  --  2.43* 2.42* 2.17* 2.25*  CALCIUM 10.1  < > 9.8  --  9.3 9.2 9.6 9.5  MG 1.9  --   --  2.1 2.1 2.1 1.9  --   PHOS  --   --   --  4.3 3.1 3.1 2.4*  --   < > = values in this interval not displayed.  Liver Function Tests:  Recent Labs Lab 07/12/16 1446 07/13/16 0446 07/14/16 0210  AST 20 18 19   ALT 14 15 15   ALKPHOS 107 88 74  BILITOT 0.9 1.1 0.6  PROT 7.3 7.1 6.7  ALBUMIN 3.1* 2.7* 2.6*    Recent  Labs Lab 07/12/16 1446 07/13/16 1650  LIPASE 15 14   No results for input(s): AMMONIA in the last 168 hours.  CBC:  Recent Labs Lab 07/13/16 0446 07/14/16 0210 07/16/16 0440 07/17/16 0246 07/18/16 0216 07/19/16 0236  WBC 20.2* 12.8* 10.4 10.4 11.7* 11.0*  NEUTROABS 18.6*  --  8.4* 8.2* 9.4* 8.7*  HGB 10.8* 10.4* 10.6* 10.5* 10.5* 10.2*  HCT 35.7* 34.2* 34.7* 34.1* 33.9* 33.2*  MCV 98.1 97.2 96.1 94.2 94.7 94.3  PLT 73* 59* 75* 74* 94* 142*    Cardiac Enzymes:  Recent Labs Lab 07/13/16 0646 07/13/16 1204 07/13/16 1752  TROPONINI 0.05* 0.05* 0.06*    BNP: BNP (last 3 results)  Recent Labs  04/02/16 0909 04/11/16 0922 07/12/16 1447  BNP 317.2* 180.4* 318.6*    ProBNP (last 3 results) No results for input(s): PROBNP in the last 8760 hours.    Other results:  Imaging: No results found.   Medications:     Scheduled Medications: . allopurinol  300 mg Oral Daily  . atorvastatin  10 mg Oral Daily  . cefTRIAXone (ROCEPHIN)  IV  2 g Intravenous Q24H  . hydroxychloroquine  200 mg Oral BID  . metoprolol succinate  12.5 mg Oral Daily  . neomycin-polymyxin b-dexamethasone  1 application Both Eyes QHS  . omega-3 acid ethyl esters  1 g Oral Daily  .  polyvinyl alcohol  1 drop Both Eyes QHS  . sodium chloride flush  3 mL Intravenous Q12H  . torsemide  60 mg Oral q1800  . torsemide  80 mg Oral Daily    Infusions:   PRN Medications: acetaminophen, albuterol, loperamide, metoprolol, ondansetron **OR** ondansetron (ZOFRAN) IV   Assessment:   1. Sepsis 2/2 E-Coli Bacteremia due to UTI.  UCx and BCx both positive for E-Coli - Continue Ceftriaxone per primary.  Got Vanc/Zosyn on admission.  - CT ABD/Pelvis with scattered hepatic cysts (increased in size from prior exam), bilateral renal cysts, ?inflamatory changes around head of pancreas, Normal appendix, no acute colitis or diverticulitis.  - CCM have signed off. Now on IM service.  2. Acute on Chronic Diastolic CHF: Echo [id number] with EF 55-60%, mild MR, PASP 66 mmHg, mildly decreased RV systolic function.  - Volume status stable. Creatinine near baseline.  - Creatinine 2.2 BUN 64.   - Continue torsemide 80 mg am and 60 mg in pm.  - Needs daily weights at SNF.  3. Acute on chronic hypoxic respiratory failure: Baseline COPD and restriction from body habitus, on 3-4L home oxygen.  Suspect pulmonary edema as cause for worsening.  Much improved after diuresis.  - Currently on 6 liters oxygen.   - Diuretics as above - Now on IM service.  To SNF today.  4. ARF on CKD stage III:  - Improved from admission. Near Baseline of 2.0 - 2.2  5. MR: Suspected to be severe in past but more recently has seemed less significant despite murmur.  Mild to moderate MR by Echo 1/17, echo this admission with mild MR.  6. Atrial Fib: Chronic.  Rate back down with treatment of sepsis.   - Continue Toprol XL 12.5 daily.  - Not AC candidate with recurrent severe GI bleeding.  7. PAH: primarily [place] group 2 based on [place] 3/17.  Not on pulmonary vasodilators.  8. Hypokalemia: Supplement potassium.  9. Deconditioning: PT recommending SNF  We will set up HF follow up for next week.   HF meds for d/c Torsemide 80 mg  q am and  60 mg q pm Toprol XL 12.5 mg daily Atorvastatin 10 mg daily KCl 40 qam, 20 qpm  Length of Stay: [place], PA-C 07/19/2016, 8:03 AM  Advanced Heart Failure Team Pager [phone] (M-F; 7a - 4p)  Please contact [facility] for night-coverage after hours (4p -7a ) and weekends on [email or web address]  Patient seen with PA, agree with the above note.  She is stable for discharge to SNF today.  Would send out on the above cardiac meds.   [name] 07/19/2016 9:25 AM

## 2016-07-19 NOTE — Discharge Summary (Addendum)
Physician Discharge Summary  April Mueller [id number] DOB: Dec 08, 1945 DOA: 07/12/2016  PCP: Melinda Crutch, MD  Admit date: 07/12/2016 Discharge date: 07/20/16  Admitted From: Home  Disposition:  SNF- [facility]  Recommendations for Outpatient Follow-up:  1. Follow up with PCP in 1-2 weeks 2. Heart Failure clinic follow up next week 3. Take antibiotics for 3 more days 4. Please obtain daily weights at SNF 5. Please obtain BMP/CBC in one week 6. CPAP settings of IPAP and EPAP at 6, medium nasal pillows   Home Health: No Equipment/Devices: CPAP   Discharge Condition:Stable CODE STATUS: Full Code Diet recommendation: Heart Healthy  Brief/Interim Summary: 70 year old female with past medical history significant for CKD stage III, COPD, chronic respiratory failure, pulmonary hypertension group 2. Pt presented to hospital 07/12/16 with fevers, chills, nausea and poor by mouth intake for 2 days prior to this admission. She also had shortness of breath, dizziness and lightheadedness. Critical care was consulted by ED physician due to concern for volume status with sepsis and respiratory failure. She was started on empiric antibiotics while awaiting culture results. Patient subsequently found to have Escherichia coli UTI and bacteremia.  As patient has a history of heart failure and needed diuresis Cardiology's Heart Failure team was consulted.  TR[facility] assumed care 07/16/2016.  Patient progressed, diuresed and was stable for discharge on 12/14.  She will need daily weights at SNF and will need to follow up with the HF clinic next week.  She will participate in PT/OT per facility protocol.  Discharge Diagnoses:  Active Problems:   Secondary pulmonary arterial hypertension   Rheumatoid arthritis (HCC)   COPD   OSA on CPAP   Chronic diastolic heart failure (HCC)   Chronic respiratory failure with hypoxia (HCC)   Normocytic anemia   CKD (chronic kidney disease) stage 3, GFR 30-59 ml/min   HTN  (hypertension)   Myelodysplastic syndrome (HCC)   Persistent atrial fibrillation (HCC)   Sepsis (HCC)   Thrombocytopenia (HCC)   Urinary tract infection with hematuria   Fever   Hypotension   Acute on chronic respiratory failure with hypoxia (HCC)   Sepsis due to Escherichia coli (HCC)  Sepsis secondary to Escherichia coli UTI and Escherichia coli bacteremia / Leukocytosis  - Blood cx and urine cx from 12/7 are growing E.Coli - Switch rocephin to keflex for 3 additional days - Hemodynamically stable  Acute on chronic diastolic CHF - 2-D echo with ejection fraction 55%, mild MR, mildly decreased RV systolic function - Per cardiology continue PO diuretics 80mg  torsemide qAM and 60mg  qPM - Continue daily weight and strict intake and output - Net -516ml yesterday - Weight down since yesterday  - Patient to follow up in HF clinic next week  Rheumatoid arthritis ([place]) - On Plaquenil at home  Acute on chronic respiratory failure with hypoxia (HCC) / COPD, OSA on CPAP / Secondary pulmonary arterial hypertension - BiPAP intermittently. Currently on nasal cannula oxygen - Baseline is 3-4L [place] - Respiratory status stable   Acute renal failure superimposed on CKD (chronic kidney disease) stage 3, GFR 30-59 ml/min - Baseline creatinine between 2 and 2.2 - Monitor renal function daily sincepatient on Lasix - Repeat BMP in 1 week  Troponin elevation - Likely demand ischemia due to CKD, sepsis - Cardio following   Dyslipidemia - Continue atorvastatin   Hypokalemia - Due to Lasix  - Continue to supplement with 104mEq  In am and 20mg  in PM  HTN (hypertension), essential  - BP controlled  Persistent atrial fibrillation (HCC) - CHADS vasc 4 - Has been taken off of Eliquis in past by cardio in the setting of GI bleed  - Low dose Toprol XL 12.5mg  restarted  Anemia of chronic kidney disease / IDA - Hemoglobin stable - Pt follows with Dr. Irene Limbo of  hematology - She was taken off of Eliquis in the setting of chronic GI bleed   Thrombocytopenia ([place]) / Myelodysplastic syndrome ([place]) - Likely related to MDS - She follows with Dr. Irene Limbo of hematology/ oncology  Discharge Instructions  Discharge Instructions    Activity as tolerated - No restrictions    Complete by:  As directed    Call MD for:  difficulty breathing, headache or visual disturbances    Complete by:  As directed    Call MD for:  extreme fatigue    Complete by:  As directed    Call MD for:  hives    Complete by:  As directed    Call MD for:  persistant dizziness or light-headedness    Complete by:  As directed    Call MD for:  persistant nausea and vomiting    Complete by:  As directed    Call MD for:  severe uncontrolled pain    Complete by:  As directed    Call MD for:  temperature >100.4    Complete by:  As directed    Diet - low sodium heart healthy    Complete by:  As directed    Increase activity slowly    Complete by:  As directed        Medication List    TAKE these medications   acetaminophen 325 MG tablet Commonly known as:  TYLENOL Take 2 tablets (650 mg total) by mouth every 6 (six) hours as needed for mild pain (or Fever >/= 101).   albuterol (2.5 MG/3ML) 0.083% nebulizer solution Commonly known as:  PROVENTIL Take 3 mLs (2.5 mg total) by nebulization every 4 (four) hours as needed for wheezing or shortness of breath.   ALPRAZolam 0.25 MG tablet Commonly known as:  XANAX Take 1 tablet (0.25 mg total) by mouth 2 (two) times daily as needed for anxiety or sleep.   atorvastatin 10 MG tablet Commonly known as:  LIPITOR TAKE 1 TABLET DAILY   carboxymethylcellulose 0.5 % Soln Commonly known as:  REFRESH PLUS Place 1 drop into both eyes at bedtime.   cephALEXin 250 MG capsule Commonly known as:  KEFLEX Take 1 capsule (250 mg total) by mouth every 8 (eight) hours.   Fish Oil 1200 MG Caps Take 1,200 mg by mouth daily.    hydroxychloroquine 200 MG tablet Commonly known as:  PLAQUENIL Take 200 mg by mouth 2 (two) times daily.   metoprolol succinate 25 MG 24 hr tablet Commonly known as:  TOPROL-XL Take 0.5 tablets (12.5 mg total) by mouth daily. Start taking on:  07/20/2016 What changed:  See the new instructions.   neomycin-polymyxin b-dexamethasone 3.5-10000-0.1 Oint Commonly known as:  MAXITROL Place 1 application into both eyes at bedtime.   OXYGEN Inhale 3-6 L into the lungs continuous.   potassium chloride 10 MEQ tablet Commonly known as:  K-DUR Take 4 tabs in AM and 2 tabs in PM What changed:  how much to take  how to take this  when to take this  additional instructions   torsemide 20 MG tablet Commonly known as:  DEMADEX Take 3 tablets (60 mg total) by mouth daily at 6 PM.  What changed:  You were already taking a medication with the same name, and this prescription was added. Make sure you understand how and when to take each.   torsemide 20 MG tablet Commonly known as:  DEMADEX Take 4 tablets (80 mg total) by mouth daily. Start taking on:  07/20/2016 What changed:  how much to take  how to take this  when to take this  additional instructions   ZYLOPRIM 300 MG tablet Generic drug:  allopurinol Take 300 mg by mouth daily.      Follow-up Information    Darrick Grinder, NP Follow up on 07/25/2016.   Specialty:  Cardiology Why:  at 2:40 Heart Failure Clinic Contact information: [address] [address] [phone]        Sullivan Lone, MD. Schedule an appointment as soon as possible for a visit in 3 week(s).   Specialties:  Hematology, Oncology Contact information: [place] [address] [phone]        Melinda Crutch, MD. Schedule an appointment as soon as possible for a visit in 2 week(s).   Specialty:  Family Medicine Contact information: [place] [address] [phone]          No Known  Allergies  Consultations:  Heart Failure- Cardiology  PT/OT   Procedures/Studies: Ct Abdomen Pelvis Wo Contrast  Result Date: 07/13/2016 CLINICAL DATA:  Sepsis, diarrhea EXAM: CT ABDOMEN AND PELVIS WITHOUT CONTRAST TECHNIQUE: Multidetector CT imaging of the abdomen and pelvis was performed following the standard protocol without IV contrast. COMPARISON:  CT chest 05/06/2013 FINDINGS: Lower chest: There is dilatation of descending aorta up to 4.1 cm stable from prior exam. There is small right pleural effusion with right base posterior atelectasis. Mild bronchitic changes are noted bilateral lower lobe. Cardiomegaly is noted. Hepatobiliary: Study is limited without IV contrast. Unenhanced liver shows no biliary ductal dilatation. Scattered hepatic cysts are again noted with progression in size from prior exam. The largest cyst in anterior aspect of the right hepatic dome measures 4 cm. Largest cyst in lateral segment of left hepatic lobe measures 3.4 cm. No calcified gallstones are noted within gallbladder. There is micro nodular liver contour. Chronic liver disease or cirrhosis cannot be excluded. Pancreas: No focal abnormality is noted in unenhanced pancreas. Axial image 33 there is mild stranding of the fat surrounding the pancreatic head region. There is mild stranding of the fat inferior to pancreatic head region and just above the distal duodenum please see coronal image 66. Mild focal pancreatitis or duodenal inflammation cannot be excluded. Clinical correlation is necessary. Spleen: Unenhanced spleen is normal. Adrenals/Urinary Tract: Again noted mild thickening of left adrenal gland. The right adrenal gland is stable. There is a cyst in midpole posterior aspect of the left kidney measures 4.1 cm. There is cortical scarring midpole of the right kidney. A cyst in lower pole of the right kidney measures 4.4 cm. No hydronephrosis or hydroureter. No nephrolithiasis. No calcified calculi are noted  within under distended urinary bladder. There is nonobstructive calcified calculus in lower pole of the left kidney measures 4 mm. Stomach/Bowel: Oral contrast material was given to the patient. There is no evidence of small bowel obstruction. No thickened or dilated small bowel loops are noted. There is no pericecal inflammation. Normal appendix is partially visualized in axial image 51. Some colonic gas noted in transverse colon. Multiple descending colon diverticula. The descending colon and sigmoid colon are empty collapsed. Multiple colonic diverticula are  noted sigmoid colon. There is redundant sigmoid colon. No evidence of acute pancreatitis. No distal colitis. Some liquid stool noted within rectum. Diarrhea cannot be excluded. Vascular/Lymphatic: No aortic aneurysm. Atherosclerotic calcifications of abdominal aorta and iliac arteries. Reproductive: The uterus is atrophic. There is a cyst/follicle within right ovary measures 2 cm. Other: No free abdominal air.  No large abdominal ascites. Musculoskeletal: Sagittal images of the spine shows degenerative changes thoracolumbar spine. IMPRESSION: 1. Cardiomegaly is noted. Again noted aneurysmal dilatation of descending aorta stable from prior exam. Small right pleural effusion with right base posterior medially atelectasis. 2. Scattered hepatic cysts are noted progression in size from prior exam. 3. Bilateral renal cysts.  Left nonobstructive nephrolithiasis. 4. Micro nodular liver contour. Chronic liver disease or cirrhosis cannot be excluded. 5. There is subtle mild stranding of the fat surrounding the pancreatic head region in medial and inferior to duodenum. Mild focal pancreatitis or duodenal inflammation cannot be excluded. Clinical correlation is necessary. Study is limited without IV contrast. 6. No small bowel or colonic obstruction. No pericecal inflammation. Normal appendix. 7. Distal colonic diverticula. No evidence of acute colitis or diverticulitis.  8. There is a dominant follicle/cyst within right ovary measures 2 cm. Electronically Signed   By: Lahoma Crocker M.D.   On: 07/13/2016 13:43   Dg Chest 2 View  Result Date: 07/13/2016 CLINICAL DATA:  Fever.  Sepsis.  Shortness of breath. EXAM: CHEST  2 VIEW COMPARISON:  07/12/2016 and 02/20/2016 and CT scan of the chest dated 05/06/2013 FINDINGS: Chronic cardiomegaly. Tortuosity and chronic aneurysmal dilatation of the thoracic aorta. No acute infiltrates or effusions. Slight linear scarring in both lungs, stable. No acute bone abnormality. IMPRESSION: No acute abnormalities. Chronic cardiomegaly. 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 Electronically Signed   By: Lorriane Shire M.D.   On: 07/13/2016 11:29   US Renal  Result Date: 07/13/2016 CLINICAL DATA:  Sepsis. EXAM: RENAL / URINARY TRACT ULTRASOUND COMPLETE COMPARISON:  None. FINDINGS: Right Kidney: Length: 11.0 cm. Thinning of the renal parenchyma with increased echogenicity. Capsular contours are lobular. No hydronephrosis. There is a 4.3 x 3.9 x 3.7 cm cyst from the lower kidney. Left Kidney: Length: 11.1 cm. There is increased renal echogenicity and mild lobular contours. No hydronephrosis. Two lower pole cysts in measuring 3.6 x 3.5 x 4.6 cm, with an adjacent 1.7 cm cyst. Bladder: Appears normal for degree of bladder distention. IMPRESSION: Findings consistent with chronic medical renal disease. No hydronephrosis. Bilateral renal cysts. Electronically Signed   By: Jeb Levering M.D.   On: 07/13/2016 03:23   Dg Chest Port 1 View  Result Date: 07/12/2016 CLINICAL DATA:  Sepsis.  Fever, shortness of Breath EXAM: PORTABLE CHEST 1 VIEW COMPARISON:  02/20/2016 FINDINGS: Cardiomegaly with vascular congestion and mild bilateral airspace opacities, left slightly greater than right. I favor this represents asymmetric edema. No visible effusions or acute bony abnormality. IMPRESSION: Suspect mild asymmetric pulmonary edema.  Electronically Signed   By: Rolm Baptise M.D.   On: 07/12/2016 15:49   Dg Chest Port 1v Same Day  Result Date: 07/14/2016 CLINICAL DATA:  Respiratory failure with hypoxia EXAM: PORTABLE CHEST 1 VIEW COMPARISON:  07/13/2016 FINDINGS: Sternotomy wires unchanged. Lungs are adequately inflated demonstrate patchy bilateral hilar opacification which may be due to mild interstitial edema, although cannot exclude infection. No evidence of effusion. Stable cardiomegaly. Remainder of the exam is unchanged. IMPRESSION: Patchy perihilar opacification which may be due to mild interstitial edema versus infection. Stable cardiomegaly. Electronically Signed  By: Marin Olp M.D.   On: 07/14/2016 13:29      Subjective: Patient seen and evaluated this am.  Says she feels ready to discharge to SNF today.  Wants to get there to start working on her strength.  No new concerns.  Discharge Exam: Vitals:   07/19/16 0447 07/19/16 1231  BP: (!) 102/58 100/62  Pulse: 68 71  Resp: 18 18  Temp: 98.8 F (37.1 C) 98.7 F (37.1 C)   Vitals:   07/18/16 1144 07/18/16 2024 07/19/16 0447 07/19/16 1231  BP: 101/61 93/60 (!) 102/58 100/62  Pulse: 89 77 68 71  Resp:  18 18 18   Temp: 98.5 F (36.9 C) 98.6 F (37 C) 98.8 F (37.1 C) 98.7 F (37.1 C)  TempSrc: Oral Oral Oral Oral  SpO2: 100% 97% 95% 95%  Weight:   89.6 kg (197 lb 8 oz)   Height:        General: Pt is alert, awake, not in acute distress Cardiovascular: RRR, [id number] +, III/VI systolic murmur,  no rubs, no gallops Respiratory: CTA bilaterally, no wheezing, no rhonchi Abdominal: Soft, NT, ND, bowel sounds + Extremities: no edema, no cyanosis    The results of significant diagnostics from this hospitalization (including imaging, microbiology, ancillary and laboratory) are listed below for reference.     Microbiology: Recent Results (from the past 240 hour(s))  Culture, blood (Routine x 2)     Status: Abnormal   Collection Time: 07/12/16  2:37  PM  Result Value Ref Range Status   Specimen Description BLOOD RIGHT ANTECUBITAL  Final   Special Requests BOTTLES DRAWN AEROBIC AND ANAEROBIC 5CC  Final   Culture  Setup Time   Final    GRAM NEGATIVE RODS IN BOTH AEROBIC AND ANAEROBIC BOTTLES Organism ID to follow CRITICAL RESULT CALLED TO, READ BACK BY AND VERIFIED WITH: GREG ABBOTT,PHARMD @0717  07/13/16 MKELLY,MLT    Culture ESCHERICHIA COLI (A)  Final   Report Status 07/15/2016 FINAL  Final   Organism ID, Bacteria ESCHERICHIA COLI  Final      Susceptibility   Escherichia coli - MIC*    AMPICILLIN <=2 SENSITIVE Sensitive     CEFAZOLIN <=4 SENSITIVE Sensitive     CEFEPIME <=1 SENSITIVE Sensitive     CEFTAZIDIME <=1 SENSITIVE Sensitive     CEFTRIAXONE <=1 SENSITIVE Sensitive     CIPROFLOXACIN <=0.25 SENSITIVE Sensitive     GENTAMICIN <=1 SENSITIVE Sensitive     IMIPENEM <=0.25 SENSITIVE Sensitive     TRIMETH/SULFA <=20 SENSITIVE Sensitive     AMPICILLIN/SULBACTAM <=2 SENSITIVE Sensitive     PIP/TAZO <=4 SENSITIVE Sensitive     Extended ESBL NEGATIVE Sensitive     * ESCHERICHIA COLI  Blood Culture ID Panel (Reflexed)     Status: Abnormal   Collection Time: 07/12/16  2:37 PM  Result Value Ref Range Status   Enterococcus species NOT DETECTED NOT DETECTED Final   Listeria monocytogenes NOT DETECTED NOT DETECTED Final   Staphylococcus species NOT DETECTED NOT DETECTED Final   Staphylococcus aureus NOT DETECTED NOT DETECTED Final   Streptococcus species NOT DETECTED NOT DETECTED Final   Streptococcus agalactiae NOT DETECTED NOT DETECTED Final   Streptococcus pneumoniae NOT DETECTED NOT DETECTED Final   Streptococcus pyogenes NOT DETECTED NOT DETECTED Final   Acinetobacter baumannii NOT DETECTED NOT DETECTED Final   Enterobacteriaceae species DETECTED (A) NOT DETECTED Final    Comment: CRITICAL RESULT CALLED TO, READ BACK BY AND VERIFIED WITH: GREG ABBOTT, PHARMD @0717  07/13/16 MKELLY,MLT  Enterobacter cloacae complex NOT  DETECTED NOT DETECTED Final   Escherichia coli DETECTED (A) NOT DETECTED Final    Comment: CRITICAL RESULT CALLED TO, READ BACK BY AND VERIFIED WITH: GREG ABBOTT, PHARMD @0717  07/13/16 MKELLY,MLT    Klebsiella oxytoca NOT DETECTED NOT DETECTED Final   Klebsiella pneumoniae NOT DETECTED NOT DETECTED Final   Proteus species NOT DETECTED NOT DETECTED Final   Serratia marcescens NOT DETECTED NOT DETECTED Final   Carbapenem resistance NOT DETECTED NOT DETECTED Final   Haemophilus influenzae NOT DETECTED NOT DETECTED Final   Neisseria meningitidis NOT DETECTED NOT DETECTED Final   Pseudomonas aeruginosa NOT DETECTED NOT DETECTED Final   Candida albicans NOT DETECTED NOT DETECTED Final   Candida glabrata NOT DETECTED NOT DETECTED Final   Candida krusei NOT DETECTED NOT DETECTED Final   Candida parapsilosis NOT DETECTED NOT DETECTED Final   Candida tropicalis NOT DETECTED NOT DETECTED Final  Culture, blood (Routine x 2)     Status: Abnormal   Collection Time: 07/12/16  3:12 PM  Result Value Ref Range Status   Specimen Description BLOOD RIGHT FOREARM  Final   Special Requests BOTTLES DRAWN AEROBIC AND ANAEROBIC 5CC  Final   Culture  Setup Time   Final    GRAM NEGATIVE RODS IN BOTH AEROBIC AND ANAEROBIC BOTTLES CRITICAL VALUE NOTED.  VALUE IS CONSISTENT WITH PREVIOUSLY REPORTED AND CALLED VALUE.    Culture (A)  Final    ESCHERICHIA COLI SUSCEPTIBILITIES PERFORMED ON PREVIOUS CULTURE WITHIN THE LAST 5 DAYS.    Report Status 07/15/2016 FINAL  Final  Urine culture     Status: Abnormal   Collection Time: 07/12/16  3:21 PM  Result Value Ref Range Status   Specimen Description URINE, CLEAN CATCH  Final   Special Requests NONE  Final   Culture >=100,000 COLONIES/mL ESCHERICHIA COLI (A)  Final   Report Status 07/14/2016 FINAL  Final   Organism ID, Bacteria ESCHERICHIA COLI (A)  Final      Susceptibility   Escherichia coli - MIC*    AMPICILLIN <=2 SENSITIVE Sensitive     CEFAZOLIN <=4  SENSITIVE Sensitive     CEFTRIAXONE <=1 SENSITIVE Sensitive     CIPROFLOXACIN <=0.25 SENSITIVE Sensitive     GENTAMICIN <=1 SENSITIVE Sensitive     IMIPENEM <=0.25 SENSITIVE Sensitive     NITROFURANTOIN <=16 SENSITIVE Sensitive     TRIMETH/SULFA <=20 SENSITIVE Sensitive     AMPICILLIN/SULBACTAM <=2 SENSITIVE Sensitive     PIP/TAZO <=4 SENSITIVE Sensitive     Extended ESBL NEGATIVE Sensitive     * >=100,000 COLONIES/mL ESCHERICHIA COLI  MRSA PCR Screening     Status: None   Collection Time: 07/12/16  6:53 PM  Result Value Ref Range Status   MRSA by PCR NEGATIVE NEGATIVE Final    Comment:        The GeneXpert MRSA Assay (FDA approved for NASAL specimens only), is one component of a comprehensive MRSA colonization surveillance program. It is not intended to diagnose MRSA infection nor to guide or monitor treatment for MRSA infections.   Culture, sputum-assessment     Status: None   Collection Time: 07/13/16  6:29 AM  Result Value Ref Range Status   Specimen Description EXPECTORATED SPUTUM  Final   Special Requests NONE  Final   Sputum evaluation   Final    Sputum specimen not acceptable for testing.  Please recollect.   RESULT CALLED TO, READ BACK BY AND VERIFIED WITH: [place], NURSE AT 1035 ON [phone]  BY Rhea Bleacher    Report Status 07/13/2016 FINAL  Final     Labs: BNP (last 3 results)  Recent Labs  04/02/16 0909 04/11/16 0922 07/12/16 1447  BNP 317.2* 180.4* 0000000*   Basic Metabolic Panel:  Recent Labs Lab 07/13/16 0446  07/14/16 0210 07/15/16 0241 07/16/16 0440 07/17/16 0246 07/18/16 0216 07/19/16 0236  NA 137  < > 136  --  139 140 140 138  K 4.6  < > 4.5  --  3.1* 3.0* 3.4* 3.8  CL 104  < > 103  --  99* 98* 98* 95*  CO2 24  < > 25  --  30 31 31  32  GLUCOSE 96  < > 127*  --  100* 117* 140* 119*  BUN 48*  < > 53*  --  68* 68* 67* 64*  CREATININE 2.99*  < > 2.76*  --  2.43* 2.42* 2.17* 2.25*  CALCIUM 10.1  < > 9.8  --  9.3 9.2 9.6 9.5  MG 1.9  --   --   2.1 2.1 2.1 1.9  --   PHOS  --   --   --  4.3 3.1 3.1 2.4*  --   < > = values in this interval not displayed. Liver Function Tests:  Recent Labs Lab 07/12/16 1446 07/13/16 0446 07/14/16 0210  AST 20 18 19   ALT 14 15 15   ALKPHOS 107 88 74  BILITOT 0.9 1.1 0.6  PROT 7.3 7.1 6.7  ALBUMIN 3.1* 2.7* 2.6*    Recent Labs Lab 07/12/16 1446 07/13/16 1650  LIPASE 15 14   No results for input(s): AMMONIA in the last 168 hours. CBC:  Recent Labs Lab 07/13/16 0446 07/14/16 0210 07/16/16 0440 07/17/16 0246 07/18/16 0216 07/19/16 0236  WBC 20.2* 12.8* 10.4 10.4 11.7* 11.0*  NEUTROABS 18.6*  --  8.4* 8.2* 9.4* 8.7*  HGB 10.8* 10.4* 10.6* 10.5* 10.5* 10.2*  HCT 35.7* 34.2* 34.7* 34.1* 33.9* 33.2*  MCV 98.1 97.2 96.1 94.2 94.7 94.3  PLT 73* 59* 75* 74* 94* 142*   Cardiac Enzymes:  Recent Labs Lab 07/13/16 0646 07/13/16 1204 07/13/16 1752  TROPONINI 0.05* 0.05* 0.06*   BNP: Invalid input(s): POCBNP CBG: No results for input(s): GLUCAP in the last 168 hours. D-Dimer No results for input(s): DDIMER in the last 72 hours. Hgb A1c No results for input(s): HGBA1C in the last 72 hours. Lipid Profile No results for input(s): CHOL, HDL, LDLCALC, TRIG, CHOLHDL, LDLDIRECT in the last 72 hours. Thyroid function studies No results for input(s): TSH, T4TOTAL, T3FREE, THYROIDAB in the last 72 hours.  Invalid input(s): FREET3 Anemia work up No results for input(s): VITAMINB12, FOLATE, FERRITIN, TIBC, IRON, RETICCTPCT in the last 72 hours. Urinalysis    Component Value Date/Time   COLORURINE YELLOW 07/12/2016 2046   APPEARANCEUR CLOUDY (A) 07/12/2016 2046   LABSPEC 1.010 07/12/2016 2046   PHURINE 5.0 07/12/2016 2046   GLUCOSEU NEGATIVE 07/12/2016 2046   HGBUR LARGE (A) 07/12/2016 2046   BILIRUBINUR NEGATIVE 07/12/2016 2046   [place] NEGATIVE 07/12/2016 2046   PROTEINUR 30 (A) 07/12/2016 2046   NITRITE NEGATIVE 07/12/2016 2046   LEUKOCYTESUR LARGE (A) 07/12/2016 2046    Sepsis Labs Invalid input(s): PROCALCITONIN,  WBC,  LACTICIDVEN Microbiology Recent Results (from the past 240 hour(s))  Culture, blood (Routine x 2)     Status: Abnormal   Collection Time: 07/12/16  2:37 PM  Result Value Ref Range Status   Specimen Description BLOOD RIGHT ANTECUBITAL  Final  Special Requests BOTTLES DRAWN AEROBIC AND ANAEROBIC 5CC  Final   Culture  Setup Time   Final    GRAM NEGATIVE RODS IN BOTH AEROBIC AND ANAEROBIC BOTTLES Organism ID to follow CRITICAL RESULT CALLED TO, READ BACK BY AND VERIFIED WITH: GREG ABBOTT,PHARMD @0717  07/13/16 MKELLY,MLT    Culture ESCHERICHIA COLI (A)  Final   Report Status 07/15/2016 FINAL  Final   Organism ID, Bacteria ESCHERICHIA COLI  Final      Susceptibility   Escherichia coli - MIC*    AMPICILLIN <=2 SENSITIVE Sensitive     CEFAZOLIN <=4 SENSITIVE Sensitive     CEFEPIME <=1 SENSITIVE Sensitive     CEFTAZIDIME <=1 SENSITIVE Sensitive     CEFTRIAXONE <=1 SENSITIVE Sensitive     CIPROFLOXACIN <=0.25 SENSITIVE Sensitive     GENTAMICIN <=1 SENSITIVE Sensitive     IMIPENEM <=0.25 SENSITIVE Sensitive     TRIMETH/SULFA <=20 SENSITIVE Sensitive     AMPICILLIN/SULBACTAM <=2 SENSITIVE Sensitive     PIP/TAZO <=4 SENSITIVE Sensitive     Extended ESBL NEGATIVE Sensitive     * ESCHERICHIA COLI  Blood Culture ID Panel (Reflexed)     Status: Abnormal   Collection Time: 07/12/16  2:37 PM  Result Value Ref Range Status   Enterococcus species NOT DETECTED NOT DETECTED Final   Listeria monocytogenes NOT DETECTED NOT DETECTED Final   Staphylococcus species NOT DETECTED NOT DETECTED Final   Staphylococcus aureus NOT DETECTED NOT DETECTED Final   Streptococcus species NOT DETECTED NOT DETECTED Final   Streptococcus agalactiae NOT DETECTED NOT DETECTED Final   Streptococcus pneumoniae NOT DETECTED NOT DETECTED Final   Streptococcus pyogenes NOT DETECTED NOT DETECTED Final   Acinetobacter baumannii NOT DETECTED NOT DETECTED Final    Enterobacteriaceae species DETECTED (A) NOT DETECTED Final    Comment: CRITICAL RESULT CALLED TO, READ BACK BY AND VERIFIED WITH: GREG ABBOTT, PHARMD @0717  07/13/16 MKELLY,MLT    Enterobacter cloacae complex NOT DETECTED NOT DETECTED Final   Escherichia coli DETECTED (A) NOT DETECTED Final    Comment: CRITICAL RESULT CALLED TO, READ BACK BY AND VERIFIED WITH: GREG ABBOTT, PHARMD @0717  07/13/16 MKELLY,MLT    Klebsiella oxytoca NOT DETECTED NOT DETECTED Final   Klebsiella pneumoniae NOT DETECTED NOT DETECTED Final   Proteus species NOT DETECTED NOT DETECTED Final   Serratia marcescens NOT DETECTED NOT DETECTED Final   Carbapenem resistance NOT DETECTED NOT DETECTED Final   Haemophilus influenzae NOT DETECTED NOT DETECTED Final   Neisseria meningitidis NOT DETECTED NOT DETECTED Final   Pseudomonas aeruginosa NOT DETECTED NOT DETECTED Final   Candida albicans NOT DETECTED NOT DETECTED Final   Candida glabrata NOT DETECTED NOT DETECTED Final   Candida krusei NOT DETECTED NOT DETECTED Final   Candida parapsilosis NOT DETECTED NOT DETECTED Final   Candida tropicalis NOT DETECTED NOT DETECTED Final  Culture, blood (Routine x 2)     Status: Abnormal   Collection Time: 07/12/16  3:12 PM  Result Value Ref Range Status   Specimen Description BLOOD RIGHT FOREARM  Final   Special Requests BOTTLES DRAWN AEROBIC AND ANAEROBIC 5CC  Final   Culture  Setup Time   Final    GRAM NEGATIVE RODS IN BOTH AEROBIC AND ANAEROBIC BOTTLES CRITICAL VALUE NOTED.  VALUE IS CONSISTENT WITH PREVIOUSLY REPORTED AND CALLED VALUE.    Culture (A)  Final    ESCHERICHIA COLI SUSCEPTIBILITIES PERFORMED ON PREVIOUS CULTURE WITHIN THE LAST 5 DAYS.    Report Status 07/15/2016 FINAL  Final  Urine culture  Status: Abnormal   Collection Time: 07/12/16  3:21 PM  Result Value Ref Range Status   Specimen Description URINE, CLEAN CATCH  Final   Special Requests NONE  Final   Culture >=100,000 COLONIES/mL ESCHERICHIA COLI  (A)  Final   Report Status 07/14/2016 FINAL  Final   Organism ID, Bacteria ESCHERICHIA COLI (A)  Final      Susceptibility   Escherichia coli - MIC*    AMPICILLIN <=2 SENSITIVE Sensitive     CEFAZOLIN <=4 SENSITIVE Sensitive     CEFTRIAXONE <=1 SENSITIVE Sensitive     CIPROFLOXACIN <=0.25 SENSITIVE Sensitive     GENTAMICIN <=1 SENSITIVE Sensitive     IMIPENEM <=0.25 SENSITIVE Sensitive     NITROFURANTOIN <=16 SENSITIVE Sensitive     TRIMETH/SULFA <=20 SENSITIVE Sensitive     AMPICILLIN/SULBACTAM <=2 SENSITIVE Sensitive     PIP/TAZO <=4 SENSITIVE Sensitive     Extended ESBL NEGATIVE Sensitive     * >=100,000 COLONIES/mL ESCHERICHIA COLI  MRSA PCR Screening     Status: None   Collection Time: 07/12/16  6:53 PM  Result Value Ref Range Status   MRSA by PCR NEGATIVE NEGATIVE Final    Comment:        The GeneXpert MRSA Assay (FDA approved for NASAL specimens only), is one component of a comprehensive MRSA colonization surveillance program. It is not intended to diagnose MRSA infection nor to guide or monitor treatment for MRSA infections.   Culture, sputum-assessment     Status: None   Collection Time: 07/13/16  6:29 AM  Result Value Ref Range Status   Specimen Description EXPECTORATED SPUTUM  Final   Special Requests NONE  Final   Sputum evaluation   Final    Sputum specimen not acceptable for testing.  Please recollect.   RESULT CALLED TO, READ BACK BY AND VERIFIED WITH: Luella Cook, NURSE AT 1035 ON [id number] BY Rhea Bleacher    Report Status 07/13/2016 FINAL  Final     Time coordinating discharge: Over 30 minutes  SIGNED:   Loretha Stapler, MD  [facility] 07/19/2016, 12:52 PM Pager [phone] If 7PM-7AM, please contact night-coverage [email or web address] Password [email or web address]

## 2016-07-20 DIAGNOSIS — M069 Rheumatoid arthritis, unspecified: Secondary | ICD-10-CM

## 2016-07-20 LAB — CBC WITH DIFFERENTIAL/PLATELET
Basophils Absolute: 0 10*3/uL (ref 0.0–0.1)
Basophils Relative: 0 %
Eosinophils Absolute: 0.2 10*3/uL (ref 0.0–0.7)
Eosinophils Relative: 2 %
HCT: 34.3 % — ABNORMAL LOW (ref 36.0–46.0)
Hemoglobin: 10.5 g/dL — ABNORMAL LOW (ref 12.0–15.0)
LYMPHS ABS: 1.3 10*3/uL (ref 0.7–4.0)
LYMPHS PCT: 14 %
MCH: 28.8 pg (ref 26.0–34.0)
MCHC: 30.6 g/dL (ref 30.0–36.0)
MCV: 94 fL (ref 78.0–100.0)
MONOS PCT: 7 %
Monocytes Absolute: 0.6 10*3/uL (ref 0.1–1.0)
Neutro Abs: 7.3 10*3/uL (ref 1.7–7.7)
Neutrophils Relative %: 77 %
PLATELETS: 164 10*3/uL (ref 150–400)
RBC: 3.65 MIL/uL — AB (ref 3.87–5.11)
RDW: 16.3 % — ABNORMAL HIGH (ref 11.5–15.5)
WBC: 9.4 10*3/uL (ref 4.0–10.5)

## 2016-07-20 LAB — BASIC METABOLIC PANEL
Anion gap: 10 (ref 5–15)
BUN: 62 mg/dL — AB (ref 6–20)
CHLORIDE: 94 mmol/L — AB (ref 101–111)
CO2: 36 mmol/L — ABNORMAL HIGH (ref 22–32)
CREATININE: 2.38 mg/dL — AB (ref 0.44–1.00)
Calcium: 9.8 mg/dL (ref 8.9–10.3)
GFR calc Af Amer: 23 mL/min — ABNORMAL LOW (ref >60)
GFR, EST NON AFRICAN AMERICAN: 20 mL/min — AB (ref >60)
GLUCOSE: 98 mg/dL (ref 65–99)
POTASSIUM: 3.5 mmol/L (ref 3.5–5.1)
Sodium: 140 mmol/L (ref 135–145)

## 2016-07-20 MED ORDER — POTASSIUM CHLORIDE CRYS ER 20 MEQ PO TBCR: 20.0000 meq | EXTENDED_RELEASE_TABLET | Freq: Every day | ORAL | Status: DC

## 2016-07-20 MED ORDER — POTASSIUM CHLORIDE CRYS ER 20 MEQ PO TBCR
20.0000 meq | EXTENDED_RELEASE_TABLET | Freq: Every day | ORAL | Status: DC
  Administered 2016-07-20 – 2016-07-22 (×3): 20 meq via ORAL
  Filled 2016-07-20 (×3): qty 1

## 2016-07-20 MED ORDER — TORSEMIDE 20 MG PO TABS: 80.0000 mg | ORAL_TABLET | Freq: Every day | ORAL | 0 refills | Status: DC

## 2016-07-20 MED ORDER — POTASSIUM CHLORIDE CRYS ER 20 MEQ PO TBCR
40.0000 meq | EXTENDED_RELEASE_TABLET | Freq: Every day | ORAL | Status: DC
  Administered 2016-07-20 – 2016-07-23 (×4): 40 meq via ORAL
  Filled 2016-07-20 (×4): qty 2

## 2016-07-20 MED ORDER — POTASSIUM CHLORIDE CRYS ER 20 MEQ PO TBCR: 40.0000 meq | EXTENDED_RELEASE_TABLET | Freq: Every day | ORAL | Status: DC

## 2016-07-20 NOTE — Progress Notes (Signed)
Pt. Is alert and oriented, blood pressure low at baseline, SNF states that they will not take pt with low b/p. MD aware is comfortable. SW contacted.to follow up

## 2016-07-20 NOTE — Progress Notes (Signed)
Advanced Heart Failure Rounding Note   Subjective:     70 y.o. 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   Admitted with ecoli urosespis. WBC 17.5. Procalcitonin 40.47.Transferred to ICU on 12/8 for respiratory failure and volume overload.   Transitioned to po diuretics 07/17/16.  Weight stable.   Feeling much better. Had insurance issues last night. Planning on SNF today.   Creatinine 2.42 -> 2.17 -> 2.25 -> 2.38. Baseline ~2.0 - 2.2  Objective:   Weight Range:  Vital Signs:   Temp:  [98.1 F (36.7 C)-98.7 F (37.1 C)] 98.1 F (36.7 C) (12/15 0442) Pulse Rate:  [71-77] 73 (12/15 0442) Resp:  [18] 18 (12/15 0442) BP: (92-106)/(51-62) 106/51 (12/15 0442) SpO2:  [95 %-98 %] 98 % (12/15 0442) Weight:  [198 lb 3.2 oz (89.9 kg)] 198 lb 3.2 oz (89.9 kg) (12/15 0442) Last BM Date: 07/19/16  Weight change: Filed Weights   07/18/16 0500 07/19/16 0447 07/20/16 0442  Weight: 198 lb 6.4 oz (90 kg) 197 lb 8 oz (89.6 kg) 198 lb 3.2 oz (89.9 kg)    Intake/Output:   Intake/Output Summary (Last 24 hours) at 07/20/16 1033 Last data filed at 07/20/16 0757  Gross per 24 hour  Intake              120 ml  Output             1000 ml  Net             -880 ml     Physical Exam: General: Well appearing today. 6L [place]  NAD  HEENT: Normal Neck: supple. JVP appears around 7-8 cm. Carotids 2+ bilat; no bruits. No thyromegaly or nodule noted.  Cor: PMI nondisplaced. Irregular. 3/6 MR.  Lungs: Mildly diminished basilar sounds.  Otherwise clear.  Abdomen: soft, NT, ND, no HSM. No bruits or masses. +BS  Extremities: no cyanosis, clubbing, rash. No edema.   Neuro: alert & orientedx3, cranial nerves grossly intact. moves all 4 extremities w/o difficulty. Affect flat.   Telemetry: Reviewed, Afib 70-80s  Labs: Basic Metabolic Panel:  Recent Labs Lab 07/15/16 0241 07/16/16 0440  07/17/16 0246 07/18/16 0216 07/19/16 0236 07/20/16 0307  NA  --  139 140 140 138 140  K  --  3.1* 3.0* 3.4* 3.8 3.5  CL  --  99* 98* 98* 95* 94*  CO2  --  30 31 31  32 36*  GLUCOSE  --  100* 117* 140* 119* 98  BUN  --  68* 68* 67* 64* 62*  CREATININE  --  2.43* 2.42* 2.17* 2.25* 2.38*  CALCIUM  --  9.3 9.2 9.6 9.5 9.8  MG 2.1 2.1 2.1 1.9  --   --   PHOS 4.3 3.1 3.1 2.4*  --   --     Liver Function Tests:  Recent Labs Lab 07/14/16 0210  AST 19  ALT 15  ALKPHOS 74  BILITOT 0.6  PROT 6.7  ALBUMIN 2.6*    Recent Labs Lab 07/13/16 1650  LIPASE 14   No results for input(s): AMMONIA in the last 168 hours.  CBC:  Recent Labs Lab 07/16/16 0440 07/17/16 0246 07/18/16 0216 07/19/16 0236 07/20/16 0307  WBC 10.4 10.4 11.7* 11.0* 9.4  NEUTROABS 8.4* 8.2* 9.4* 8.7* 7.3  HGB 10.6* 10.5* 10.5* 10.2* 10.5*  HCT 34.7* 34.1* 33.9* 33.2* 34.3*  MCV 96.1 94.2 94.7 94.3  94.0  PLT 75* 74* 94* 142* 164    Cardiac Enzymes:  Recent Labs Lab 07/13/16 1204 07/13/16 1752  TROPONINI 0.05* 0.06*    BNP: BNP (last 3 results)  Recent Labs  04/02/16 0909 04/11/16 0922 07/12/16 1447  BNP 317.2* 180.4* 318.6*    ProBNP (last 3 results) No results for input(s): PROBNP in the last 8760 hours.    Other results:  Imaging: No results found.   Medications:     Scheduled Medications: . allopurinol  300 mg Oral Daily  . atorvastatin  10 mg Oral Daily  . cephALEXin  250 mg Oral Q8H  . hydroxychloroquine  200 mg Oral BID  . metoprolol succinate  12.5 mg Oral Daily  . neomycin-polymyxin b-dexamethasone  1 application Both Eyes QHS  . omega-3 acid ethyl esters  1 g Oral Daily  . polyvinyl alcohol  1 drop Both Eyes QHS  . potassium chloride  40 mEq Oral Daily   And  . potassium chloride  20 mEq Oral Daily  . sodium chloride flush  3 mL Intravenous Q12H  . torsemide  60 mg Oral q1800  . torsemide  80 mg Oral Daily    Infusions:   PRN  Medications: acetaminophen, albuterol, loperamide, metoprolol, ondansetron **OR** ondansetron (ZOFRAN) IV   Assessment:   1. Sepsis 2/2 E-Coli Bacteremia due to UTI.  UCx and BCx both positive for E-Coli - Continue Ceftriaxone per primary.  Got Vanc/Zosyn on admission.  - CT ABD/Pelvis with scattered hepatic cysts (increased in size from prior exam), bilateral renal cysts, ?inflamatory changes around head of pancreas, Normal appendix, no acute colitis or diverticulitis.  - CCM have signed off. Now on IM service.  2. Acute on Chronic Diastolic CHF: Echo [id number] with EF 55-60%, mild MR, PASP 66 mmHg, mildly decreased RV systolic function.  - Volume status stable. Creatinine near baseline.  - Creatinine 2.38. BUN 62. Mildly up from baseline but would not change at this time.  Repeat labs next week in clinic.    - Continue torsemide 80 mg am and 60 mg in pm.  - Needs daily weights at SNF.  3. Acute on chronic hypoxic respiratory failure: Baseline COPD and restriction from body habitus, on 3-4L home oxygen.  Suspect pulmonary edema as cause for worsening.  Much improved after diuresis.  - Currently on 6 liters oxygen.   - Diuretics as above - To SNF today.  4. ARF on CKD stage III:  - mild uptrend, but near baseline of 2.0 - 2.2  5. MR: Suspected to be severe in past but more recently has seemed less significant despite murmur.  Mild to moderate MR by Echo 1/17, echo this admission with mild MR.  6. Atrial Fib: Chronic.  Rate back down with treatment of sepsis.   - Continue Toprol XL 12.5 daily.  - Not AC candidate with recurrent severe GI bleeding.  7. PAH: primarily [place] group 2 based on [place] 3/17.  Not on pulmonary vasodilators.  8. Hypokalemia: Supplement potassium.  9. Deconditioning: PT recommending SNF  We have set up HF follow up for next week.   HF meds for d/c Torsemide 80 mg q am and 60 mg q pm Toprol XL 12.5 mg daily Atorvastatin 10 mg daily KCl 40 qam, 20 qpm  Length of Stay:  [place], PA-C 07/20/2016, 10:33 AM  Advanced Heart Failure Team Pager [phone] (M-F; 7a - 4p)  Please contact [facility] for night-coverage after hours (4p -7a )  and weekends on [email or web address]  Patient seen with PA, agree with the above note.  She is stable for SNF on the above meds.  We will see in followup.   [name] 07/20/2016 1:17 PM

## 2016-07-20 NOTE — Clinical Social Work Note (Signed)
CSW facilitated patient discharge including contacting patient family and facility to confirm patient discharge plans. Clinical information faxed to facility and family agreeable with plan. CSW arranged ambulance transport via [facility] to [place] at 6:00 pm. RN to call report prior to discharge [phone]).  CSW will sign off for now as social work intervention is no longer needed. Please consult [place] again if new needs arise.  Dayton Scrape, [place]

## 2016-07-20 NOTE — Progress Notes (Signed)
Provider paged to resume patients cardiac monitoring. Patient is not getting discharged tonight.  SNF will not take patient due to low blood pressure.

## 2016-07-20 NOTE — Clinical Social Work Note (Addendum)
Late entry:  CSW found out around 3:30 pm yesterday that patient's Blue Medicare is not under [employer] and is instead [employer] from [place]. CSW notified patient. Patient is agreeable to going to [place] with an LOG. [facility] called patient's insurance company to find out if patient has [place] benefits. They stated that she does but it could take up to 14 days to get authorization. [facility] agreeable to taking patient under a 5-day LOG today. They were unable to take yesterday because it was too late in the day to order a cpap. Per MD, patient had been using her home cpap at the hospital. CSW asked patient about it and she stated that the tubing on her home cpap had broken so she had been using the hospital cpap.   Patient is adamant that she does not want home health because she does not feel safe going home alone at this time. Patient reports that she has a supportive neighbor that helps her when needed. Patient reports feeling concerned that she does not know how her medical problems started and that is what makes her most nervous about going home right now.  [facility] will need updated therapy notes to send to insurance company to start auth.  Dayton Scrape, [place] [phone]  10:43 am [facility] now requesting a 14-day LOG since it takes 14 days to obtain insurance authorization. Discussed with Surveyor, quantity of social work. He wants to wait and see how patient does in PT today before doing this. CSW discussed with patient. She is still refusing home health.  Dayton Scrape, [place]  12:47 pm Got approval for 14-day LOG to SNF. SNF, patient, RN, and MD notified. CSW paged MD to update dc summary with today's date.  Dayton Scrape, [place] [phone]  1:36 pm [facility] is working on ordering her cpap machine. She are hopeful that they will have it by 3:00 today. They will notify CSW when to set up transport.  Dayton Scrape, [place]

## 2016-07-20 NOTE — Clinical Social Work Placement (Signed)
   CLINICAL SOCIAL WORK PLACEMENT  NOTE  Date:  07/20/2016  Patient Details  Name: April Mueller MRN: [id number] Date of Birth: November 30, 1945  Clinical Social Work is seeking post-discharge placement for this patient at the [place] level of care (*CSW will initial, date and re-position this form in  chart as items are completed):  Yes   Patient/family provided with [place] Work Department's list of facilities offering this level of care within the geographic area requested by the patient (or if unable, by the patient's family).  Yes   Patient/family informed of their freedom to choose among providers that offer the needed level of care, that participate in Medicare, Medicaid or managed care program needed by the patient, have an available bed and are willing to accept the patient.  Yes   Patient/family informed of 's ownership interest in [facility] and [facility], as well as of the fact that they are under no obligation to receive care at these facilities.  PASRR submitted to EDS on 07/18/16     PASRR number received on 07/18/16     Existing PASRR number confirmed on       FL2 transmitted to all facilities in geographic area requested by pt/family on 07/18/16     FL2 transmitted to all facilities within larger geographic area on       Patient informed that his/her managed care company has contracts with or will negotiate with certain facilities, including the following:        Yes   Patient/family informed of bed offers received.  Patient chooses bed at [place]     Physician recommends and patient chooses bed at      Patient to be transferred to [facility] on 07/20/16.  Patient to be transferred to facility by [facility]     Patient family notified on 07/20/16 of transfer.  Name of family member notified:        PHYSICIAN       Additional Comment:     _______________________________________________ Candie Chroman, LCSW 07/20/2016, 3:16 PM

## 2016-07-20 NOTE — Progress Notes (Signed)
Confirmed with [facility]'s admission coordinator that the facility does not feel comfortable taking pt tonight because of her low BP.  Weekend CSW to f/u in am and facilitate NH tx as appropriate.  Creta Levin, LCSW Evening/ED Coverage [id number]

## 2016-07-20 NOTE — Progress Notes (Signed)
Spoke with Al Corpus at [place] [facility] she stated that patient low blood pressures could not be admitted, Explained that her Pressures are low normal and MD was Aware. Attending night MD is aware. SW will follow-up with weekend Education officer, museum.

## 2016-07-20 NOTE — Procedures (Signed)
Placed patient on BIPAP auto titrate IPAP max 25, EPAP min 7 for the night.  Patient is tolerating well at this time.

## 2016-07-20 NOTE — Progress Notes (Signed)
Agree with discharge summary on 12/14.  Patient stable overnight- no new concerns.  Will discharge today to SNF with follow up as noted in d/c summary.

## 2016-07-20 NOTE — Progress Notes (Signed)
Physical Therapy Treatment Patient Details Name: April Mueller MRN: [id number] DOB: [birth year] Today's Date: 07/20/2016    History of Present Illness Pt adm with sepsis due to UTI and acute on chronic diastolic heart failure. PMH - chronic resp failure, ckd, copd, chf    PT Comments    Patient seen for therapy progression. Continues to require min assist for functional tasks and min guard to ambulate. During ambulation patient reports increased dizziness with standing activity. Attempted to obtain BP x3, difficulty to obtain standing BP (BP in sitting remains soft, 90s/50s with + dizziness in upright position. patient became nauseated and dry heeved x2 in standing. reports increased headache behind right eye. Nsg aware. Performed neuro screen with patient, patient with symmetrical strength UE and LE, no drift, no speech deficits, no visual changes. Does have mild left eye assymetry but had this at baseline per nsg.  At this time, contonie to feel patient would benefit from [place] SNF upon acute discharge as patient continues to required assist with basic functional tasks and remains limited with activity and safety. Patient remains increased risk of falls and resides alone with limited access to assist.  Will continue to see and progress as tolerated.  Follow Up Recommendations  SNF     Equipment Recommendations  None recommended by PT    Recommendations for Other Services       Precautions / Restrictions Precautions Precautions: Fall Precaution Comments: At baseline pt incr supplemental O2 with activity Restrictions Weight Bearing Restrictions: No    Mobility  Bed Mobility               General bed mobility comments: pt received on BSC  Transfers Overall transfer level: Needs assistance Equipment used: Rolling walker (2 wheeled) Transfers: Sit to/from Stand Sit to Stand: Min assist         General transfer comment: increased time to elevate to standing, performed  from [facility] as well as bed. Min assist for stability upon elevation  Ambulation/Gait Ambulation/Gait assistance: Min guard Ambulation Distance (Feet): 12 Feet Assistive device: Rolling walker (2 wheeled) Gait Pattern/deviations: Step-through pattern;Decreased step length - right;Decreased step length - left;Trunk flexed Gait velocity: decreased Gait velocity interpretation: Below normal speed for age/gender General Gait Details: min guard to ambulate, during ambulation patient reports increased dizziness with standing activity. Attempted to obtain BP x3, difficulty to obtain standing BP as patient because nauseated and dry heeved x2 in standing. reports increased headache behind right eye. Nsg aware. Performed neuro screen with patient, patient with symmetrical strength UE and LE, no speech deficits, no visual changes. Does have mild left eye assymetry but had this at baseline per nsg.    Stairs            Wheelchair Mobility    Modified Rankin (Stroke Patients Only)       Balance Overall balance assessment: Needs assistance Sitting-balance support: No upper extremity supported;Feet supported Sitting balance-Leahy Scale: Good     Standing balance support: Single extremity supported Standing balance-Leahy Scale: Fair Standing balance comment: continues to have heavy reliance on UE support                    Cognition Arousal/Alertness: Awake/alert Behavior During Therapy: WFL for tasks assessed/performed Overall Cognitive Status: Within Functional Limits for tasks assessed                      Exercises      General Comments  Pertinent Vitals/Pain Pain Assessment: 0-10 Pain Score: 5  Pain Location: headache behind right eye Pain Descriptors / Indicators: Headache Pain Intervention(s): Monitored during session    Home Living                      Prior Function            PT Goals (current goals can now be found in the care plan  section) Acute Rehab PT Goals Patient Stated Goal: go to rehab to get stronger prior to returning home PT Goal Formulation: With patient Time For Goal Achievement: 07/31/16 Potential to Achieve Goals: Good Progress towards PT goals: Progressing toward goals (limited today due to dizziness)    Frequency    Min 3X/week      PT Plan Current plan remains appropriate    Co-evaluation             End of Session Equipment Utilized During Treatment: Gait belt;Oxygen;Other (comment) (3L of O2 at rest; 6L of O2 with ambulation) Activity Tolerance: Treatment limited secondary to medical complications (Comment) (headache, dizziness and dry heeves) Patient left: in bed;with call bell/phone within reach;with bed alarm set     Time: [id number] PT Time Calculation (min) (ACUTE ONLY): 21 min  Charges:  $Therapeutic Activity: 8-22 mins                    G Codes:      Duncan Dull 2016-08-07, 11:37 AM Alben Deeds, PT DPT  [phone]

## 2016-07-21 ENCOUNTER — Telehealth: Payer: Self-pay | Admitting: Physician Assistant

## 2016-07-21 DIAGNOSIS — J449 Chronic obstructive pulmonary disease, unspecified: Secondary | ICD-10-CM

## 2016-07-21 LAB — CBC WITH DIFFERENTIAL/PLATELET
BASOS ABS: 0 10*3/uL (ref 0.0–0.1)
BASOS PCT: 0 %
Eosinophils Absolute: 0.1 10*3/uL (ref 0.0–0.7)
Eosinophils Relative: 2 %
HEMATOCRIT: 33.3 % — AB (ref 36.0–46.0)
Hemoglobin: 10.3 g/dL — ABNORMAL LOW (ref 12.0–15.0)
LYMPHS PCT: 15 %
Lymphs Abs: 1.4 10*3/uL (ref 0.7–4.0)
MCH: 29.2 pg (ref 26.0–34.0)
MCHC: 30.9 g/dL (ref 30.0–36.0)
MCV: 94.3 fL (ref 78.0–100.0)
MONO ABS: 0.8 10*3/uL (ref 0.1–1.0)
Monocytes Relative: 8 %
NEUTROS ABS: 7.2 10*3/uL (ref 1.7–7.7)
NEUTROS PCT: 75 %
Platelets: 181 10*3/uL (ref 150–400)
RBC: 3.53 MIL/uL — AB (ref 3.87–5.11)
RDW: 16.2 % — AB (ref 11.5–15.5)
WBC: 9.5 10*3/uL (ref 4.0–10.5)

## 2016-07-21 LAB — BASIC METABOLIC PANEL
Anion gap: 10 (ref 5–15)
BUN: 61 mg/dL — ABNORMAL HIGH (ref 6–20)
CALCIUM: 9.4 mg/dL (ref 8.9–10.3)
CO2: 34 mmol/L — AB (ref 22–32)
Chloride: 94 mmol/L — ABNORMAL LOW (ref 101–111)
Creatinine, Ser: 2.66 mg/dL — ABNORMAL HIGH (ref 0.44–1.00)
GFR calc non Af Amer: 17 mL/min — ABNORMAL LOW (ref >60)
GFR, EST AFRICAN AMERICAN: 20 mL/min — AB (ref >60)
Glucose, Bld: 111 mg/dL — ABNORMAL HIGH (ref 65–99)
POTASSIUM: 3.7 mmol/L (ref 3.5–5.1)
Sodium: 138 mmol/L (ref 135–145)

## 2016-07-21 LAB — TROPONIN I: Troponin I: 0.04 ng/mL (ref <0.03)

## 2016-07-21 MED ORDER — MORPHINE SULFATE (PF) 2 MG/ML IV SOLN
2.0000 mg | Freq: Once | INTRAVENOUS | Status: AC
  Administered 2016-07-21: 2 mg via INTRAVENOUS

## 2016-07-21 MED ORDER — MORPHINE SULFATE (PF) 2 MG/ML IV SOLN
INTRAVENOUS | Status: AC
  Administered 2016-07-21: 2 mg via INTRAVENOUS
  Filled 2016-07-21: qty 1

## 2016-07-21 MED ORDER — NITROGLYCERIN 0.4 MG SL SUBL
SUBLINGUAL_TABLET | SUBLINGUAL | Status: AC
  Filled 2016-07-21: qty 1

## 2016-07-21 MED ORDER — SODIUM CHLORIDE 0.9 % IV BOLUS (SEPSIS)
250.0000 mL | Freq: Once | INTRAVENOUS | Status: AC
  Administered 2016-07-21: 250 mL via INTRAVENOUS

## 2016-07-21 NOTE — Progress Notes (Signed)
CRITICAL VALUE ALERT  Critical value received:  [id number]  Date of notification:  07-21-2016  Time of notification:  [id number]  Critical value read back:Yes.    Nurse who received alert:  Shellee Milo, RN  MD notified (1st page):  Adair Patter, MD

## 2016-07-21 NOTE — Progress Notes (Signed)
Arrived in pt room to assess and give morning meds. Patient reports dizziness and palpitations. Obtaining ECG and paged [facility] with cardiology via [place].

## 2016-07-21 NOTE — Progress Notes (Signed)
Patient Name: April Mueller Date of Encounter: 07/21/2016  Primary Cardiologist: Dr. Selmer Dominion Problem List     Active Problems:   Secondary pulmonary arterial hypertension   Rheumatoid arthritis ([place])   COPD   OSA on CPAP   Chronic diastolic heart failure (HCC)   Chronic respiratory failure with hypoxia (HCC)   Normocytic anemia   CKD (chronic kidney disease) stage 3, GFR 30-59 ml/min   HTN (hypertension)   Myelodysplastic syndrome (HCC)   Persistent atrial fibrillation (HCC)   Sepsis (HCC)   Thrombocytopenia (HCC)   Urinary tract infection with hematuria   Fever   Hypotension   Acute on chronic respiratory failure with hypoxia ([place])   Sepsis due to Escherichia coli ([place])     Subjective   Plan was for discharge to SNF but now feeling very weak and dizzy with chest pain, palpitations and SOB.  Inpatient Medications    Scheduled Meds: . allopurinol  300 mg Oral Daily  . atorvastatin  10 mg Oral Daily  . cephALEXin  250 mg Oral Q8H  . hydroxychloroquine  200 mg Oral BID  . metoprolol succinate  12.5 mg Oral Daily  . neomycin-polymyxin b-dexamethasone  1 application Both Eyes QHS  . nitroGLYCERIN      . omega-3 acid ethyl esters  1 g Oral Daily  . polyvinyl alcohol  1 drop Both Eyes QHS  . potassium chloride  40 mEq Oral Daily   And  . potassium chloride  20 mEq Oral Daily  . sodium chloride flush  3 mL Intravenous Q12H  . torsemide  60 mg Oral q1800  . torsemide  80 mg Oral Daily   Continuous Infusions:  PRN Meds: acetaminophen, albuterol, loperamide, metoprolol, ondansetron **OR** ondansetron (ZOFRAN) IV   Vital Signs    Vitals:   07/20/16 2306 07/21/16 0510 07/21/16 0846 07/21/16 0913  BP:  (!) 102/52 (!) 103/56 (!) 86/60  Pulse: 78 61 71   Resp: 18 18    Temp:  98.2 F (36.8 C)    TempSrc:  Oral    SpO2: 93% 96%    Weight:  195 lb 11.2 oz (88.8 kg)    Height:        Intake/Output Summary (Last 24 hours) at 07/21/16 1031 Last data  filed at 07/21/16 0917  Gross per 24 hour  Intake              600 ml  Output              950 ml  Net             -350 ml   Filed Weights   07/19/16 0447 07/20/16 0442 07/21/16 0510  Weight: 197 lb 8 oz (89.6 kg) 198 lb 3.2 oz (89.9 kg) 195 lb 11.2 oz (88.8 kg)    Physical Exam   GEN: Well nourished, well developed, in no acute distress. Chronically ill appearing HEENT: Grossly normal.  Neck: Supple, no JVD, carotid bruits, or masses. Cardiac: irreg irreg, +murmur, rubs, or gallops. No clubbing, cyanosis, edema.  Radials/DP/PT 2+ and equal bilaterally.  Respiratory:  Respirations regular and unlabored, clear to auscultation bilaterally. GI: Soft, nontender, nondistended, BS + x 4. MS: no deformity or atrophy. Skin: warm and dry, no rash. Neuro:  Strength and sensation are intact. Psych: AAOx3.  Normal affect.  Labs    CBC  Recent Labs  07/20/16 0307 07/21/16 0223  WBC 9.4 9.5  NEUTROABS 7.3 7.2  HGB 10.5*  10.3*  HCT 34.3* 33.3*  MCV 94.0 94.3  PLT 164 0000000   Basic Metabolic Panel  Recent Labs  07/20/16 0307 07/21/16 0223  NA 140 138  K 3.5 3.7  CL 94* 94*  CO2 36* 34*  GLUCOSE 98 111*  BUN 62* 61*  CREATININE 2.38* 2.66*  CALCIUM 9.8 9.4     Telemetry    afib with PVCs - Personally Reviewed  ECG    afib with PVCs and prolonged QT- Personally Reviewed  Radiology    No results found.  Cardiac Studies   2D ECHO: 07/13/2016 LV EF: 55% -   60% Study Conclusions - Left ventricle: The cavity size was normal. There was mild   concentric hypertrophy. Systolic function was normal. The   estimated ejection fraction was in the range of 55% to 60%. Wall   motion was normal; there were no regional wall motion   abnormalities. Features are consistent with a pseudonormal left   ventricular filling pattern, with concomitant abnormal relaxation   and increased filling pressure (grade 2 diastolic dysfunction).   Doppler parameters are consistent with high  ventricular filling   pressure. - Descending aorta: The descending aorta was mildly dilated. - Mitral valve: There was mild regurgitation. - Left atrium: The atrium was severely dilated. - Right ventricle: Systolic function was mildly reduced. 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 - Tricuspid valve: There was mild-moderate regurgitation. - Pulmonary arteries: PA peak pressure: 66 mm Hg (S). Impressions: - The right ventricular systolic pressure was increased consistent   with moderate pulmonary hypertension.   Patient Profile     70 y.o.femalewith history of Chronic Diastolic CHF LVEF 0000000 (Moderate TR and Cor Pulmonale), severe COPD, CKD stage III, previous dissection s/p repair (2005), Chronic respiratory failure, and PAH.   Admitted with ecoli urosespis. WBC 17.5. Procalcitonin 40.47.Transferred to ICU on 12/8 for respiratory failure and volume overload.   Transitioned to po diuretics 07/17/16.  Weight stable.   Plan was for discharge to SNF but now feeling very weak and dizzy with chest pain, palpitations and SOB.   Assessment & Plan    1. Sepsis 2/2 E-Coli Bacteremia due to UTI.  UCx and BCx both positive for E-Coli - Continue Ceftriaxone per primary. Got Vanc/Zosyn on admission.  - CT ABD/Pelvis with scattered hepatic cysts (increased in size from prior exam), bilateral renal cysts, ?inflamatory changes around head of pancreas, Normal appendix, no acute colitis or diverticulitis.  - CCM signed off. Now on IM service.   2. Acute on Chronic Diastolic CHF: Echo [id number] with EF 55-60%, mild MR, PASP 66 mmHg, mildly decreased RV systolic function.  - Volume status stable. Creatinine near baseline.  - Creatinine 2.66 today. BUN 61. Mildly up from baseline but would not change at this time. - Continue torsemide 80 mg am and 60 mg in pm.   3. Acute on chronic hypoxic  respiratory failure: Baseline COPD and restriction from body habitus, on 3-4L home oxygen.  Suspect pulmonary edema as cause for worsening.  Much improved after diuresis.  - Currently on 6 liters oxygen.   - Diuretics as above  4. ARF on CKD stage III: Creatinine 2.42 -> 2.17 -> 2.25 -> 2.38 -> 2.66. Baseline ~2.0 - 2.2  5. MR: Suspected to be severe in past but more recently has seemed less significant despite murmur.  Mild to moderate MR by Echo  1/17, echo this admission with mild MR.   6. Atrial Fib: Chronic.  Rate back down with treatment of sepsis.   - Continue Toprol XL 12.5 daily.  - Not AC candidate with recurrent severe GI bleeding.   7. PAH: primarily [place] group 2 based on [place] 3/17.  Not on pulmonary vasodilators.   8. Hypokalemia: Supplement potassium.   9. Deconditioning: PT recommending SNF. Was supposed to go yesterday but now hypotensive and feeling poorly.   10. Hypotension: only BP med is Toprol 12.5mg  daily. Feeling dizzy and weak. Will stop this for now.   11. Palpitations: she has chronic afib and now with PVCs. Cannot titrate meds 2/2 low BP  12. Chest pain: now with chest tightness. Does not appear volume overloaded. No SL NTG given hypotension. Morphine didn't help much .   Dispo: will try to see if a small fluid bolus helps     Signed, Angelena Form, PA-C  07/21/2016, 10:31 AM    Attending Note:   The patient was seen and examined.  Agree with assessment and plan as noted above.  Changes made to the above note as needed.  Patient seen and independently examined with Nell Range, PA.   We discussed all aspects of the encounter. I agree with the assessment and plan as stated above.  1. Chronic diastolic CHF : She is still weak and short of breath.   Is dizzy this am - likely due to her hypotension  Will try giving 250 cc NS to see if that helps.  She has a JVP of about 8 which would seem adequate but with her pulmonary HTN, she may be underfilling  her LV.  She may ultimately need another right heart cath with measurement of her LVEDP also .   2. Pulmonary HTN:   Has moderate - severe Pulmonary HTN secondary to her severe COPD.    I have spent a total of 40 minutes with patient reviewing hospital  notes , telemetry, EKGs, labs and examining patient as well as establishing an assessment and plan that was discussed with the patient. > 50% of time was spent in direct patient care.    Thayer Headings, Brooke Bonito., MD, [facility] 07/21/2016, 11:16 AM [address]  [place] Pager [phone]

## 2016-07-21 NOTE — Progress Notes (Signed)
PROGRESS NOTE    April Mueller  [id number] DOB: Oct 29, 1945 DOA: 07/12/2016 PCP: Melinda Crutch, MD    Brief Narrative:  69 year old female with past medical history significant for CKD stage III, COPD, chronic respiratory failure, pulmonary hypertension group 2. Pt presented to hospital 07/12/16 with fevers, chills, nausea and poor by mouth intake for 2 days prior to this admission. She also had shortness of breath, dizziness and lightheadedness. Critical care was consulted by ED physician due to concern for volume status with sepsis and respiratory failure. She was started on empiric antibiotics while awaiting culture results. Patient subsequently found to have Escherichia coli UTI and bacteremia.  TRH assumed care 07/16/2016.   Assessment & Plan:   Active Problems:   Secondary pulmonary arterial hypertension   Rheumatoid arthritis (HCC)   COPD   OSA on CPAP   Chronic diastolic heart failure (HCC)   Chronic respiratory failure with hypoxia (HCC)   Normocytic anemia   CKD (chronic kidney disease) stage 3, GFR 30-59 ml/min   HTN (hypertension)   Myelodysplastic syndrome (HCC)   Persistent atrial fibrillation (HCC)   Sepsis (HCC)   Thrombocytopenia (HCC)   Urinary tract infection with hematuria   Fever   Hypotension   Acute on chronic respiratory failure with hypoxia ([place])   Sepsis due to Escherichia coli (HCC)  Hypotension with palpitations Toprol held Trend troponins EKG ordered Per cardiology note patient may need right heart cath   Sepsis secondary to Escherichia coli UTI and Escherichia coli bacteremia / Leukocytosis  - Blood cx and urine cx from 12/7 are growing E.Coli - Continue Keflex ( tomorrow is last day)    Acute on chronic diastolic CHF - 2-D echo with ejection fraction 55%, mild MR, mildly decreased RV systolic function - Per cardiology continue PO diuretics 80mg  torsemide qAM and 60mg  qPM - Continue daily weight and strict intake and output - Weight  (on 12/15 in evening) down 1.1kg from previous weight - HF team is following       Rheumatoid arthritis ([place]) - On Plaquenil at home    Acute on chronic respiratory failure with hypoxia (HCC) / COPD, OSA on CPAP / Secondary pulmonary arterial hypertension - CPAP intermittently. Currently on nasal cannula oxygen - Baseline is 3-4L [place] - Respiratory status stable      Acute renal failure superimposed on CKD (chronic kidney disease) stage 3, GFR 30-59 ml/min - Baseline creatinine between 2 and 2.2 - Creatinine at 2.66 this am - Monitor renal function daily since patient on Lasix    Troponin elevation - Likely demand ischemia due to CKD, sepsis - Cardio following     Dyslipidemia - Continue atorvastatin     Hypokalemia - Due to Laisx  - Continue to supplement with 85mEq BID - potassium WNL this am    HTN (hypertension), essential  - On IV lasix     Persistent atrial fibrillation (HCC) - CHADS vasc 4 - Has been taken off of Eliquis in past by cardio in the setting of GI bleed  - Toprol held due to hypotension      Anemia of chronic kidney disease / IDA - Hemoglobin stable - Pt follows with Dr. Irene Limbo of hematology - She was taken off of Eliquis in the setting of chronic GI bleed     Thrombocytopenia ([place]) / Myelodysplastic syndrome ([place]) - Likely related to MDS -  She follows with Dr. Irene Limbo of hematology/ oncology    DVT prophylaxis: SCD's bilaterally  Code Status: full  code  Family Communication: no family at the bedside this am Disposition Plan: plan to go to SNF when not hypotensive and patient cleared by cardiology  Consultants:   Cardiology  PT/OT  Procedures:   Intermittent Cpap  Antimicrobials:   Rocephin 12/8>12/14  Vanco, Zosyn 12/7, 12/8  Meropenem 12/8  Keflex 12/14>   Subjective: Seen this morning.  Patient at that time states she just feels anxious.  She is worried about discharging given everything that has happened in the past  day.  Wants to ensure she is on the right medications at time of discharge.  Mentions to me that she just wants to be safe.  However, after I saw patient she began having palpitations and feeling lightheaded and dizzy.  Cardiology held toprol, ordered EKG and troponins.   Objective: Vitals:   07/21/16 0510 07/21/16 0846 07/21/16 0913 07/21/16 1202  BP: (!) 102/52 (!) 103/56 (!) 86/60 102/62  Pulse: 61 71  73  Resp: 18   18  Temp: 98.2 F (36.8 C)   98.1 F (36.7 C)  TempSrc: Oral   Oral  SpO2: 96%   97%  Weight: 88.8 kg (195 lb 11.2 oz)     Height:        Intake/Output Summary (Last 24 hours) at 07/21/16 1403 Last data filed at 07/21/16 1334  Gross per 24 hour  Intake              720 ml  Output             1050 ml  Net             -330 ml   Filed Weights   07/19/16 0447 07/20/16 0442 07/21/16 0510  Weight: 89.6 kg (197 lb 8 oz) 89.9 kg (198 lb 3.2 oz) 88.8 kg (195 lb 11.2 oz)    Examination:  General exam: Appears calm and comfortable  Respiratory system: Respiratory effort normal. Few crackles at bases Cardiovascular system: S1 & S2 heard, IRR. III/VI systolic murmur best heard in mitral area. Gastrointestinal system: Abdomen is nondistended, soft and nontender. No organomegaly or masses felt. Normal bowel sounds heard. Central nervous system: Alert and oriented. No focal neurological deficits. Extremities: Symmetric 5 x 5 power. Skin: No rashes, lesions or ulcers Psychiatry: Judgement and insight appear normal. Mood & affect appropriate.     Data Reviewed: I have personally reviewed following labs and imaging studies  CBC:  Recent Labs Lab 07/17/16 0246 07/18/16 0216 07/19/16 0236 07/20/16 0307 07/21/16 0223  WBC 10.4 11.7* 11.0* 9.4 9.5  NEUTROABS 8.2* 9.4* 8.7* 7.3 7.2  HGB 10.5* 10.5* 10.2* 10.5* 10.3*  HCT 34.1* 33.9* 33.2* 34.3* 33.3*  MCV 94.2 94.7 94.3 94.0 94.3  PLT 74* 94* 142* 164 0000000   Basic Metabolic Panel:  Recent Labs Lab 07/15/16 0241   07/16/16 0440 07/17/16 0246 07/18/16 0216 07/19/16 0236 07/20/16 0307 07/21/16 0223  NA  --   < > 139 140 140 138 140 138  K  --   < > 3.1* 3.0* 3.4* 3.8 3.5 3.7  CL  --   < > 99* 98* 98* 95* 94* 94*  CO2  --   < > 30 31 31  32 36* 34*  GLUCOSE  --   < > 100* 117* 140* 119* 98 111*  BUN  --   < > 68* 68* 67* 64* 62* 61*  CREATININE  --   < > 2.43* 2.42* 2.17* 2.25* 2.38* 2.66*  CALCIUM  --   < >  9.3 9.2 9.6 9.5 9.8 9.4  MG 2.1  --  2.1 2.1 1.9  --   --   --   PHOS 4.3  --  3.1 3.1 2.4*  --   --   --   < > = values in this interval not displayed. GFR: Estimated Creatinine Clearance: 21.8 mL/min (by C-G formula based on SCr of 2.66 mg/dL (H)). Liver Function Tests: No results for input(s): AST, ALT, ALKPHOS, BILITOT, PROT, ALBUMIN in the last 168 hours. No results for input(s): LIPASE, AMYLASE in the last 168 hours. No results for input(s): AMMONIA in the last 168 hours. Coagulation Profile: No results for input(s): INR, PROTIME in the last 168 hours. Cardiac Enzymes:  Recent Labs Lab 07/21/16 1119  TROPONINI 0.04*   BNP (last 3 results) No results for input(s): PROBNP in the last 8760 hours. HbA1C: No results for input(s): HGBA1C in the last 72 hours. CBG: No results for input(s): GLUCAP in the last 168 hours. Lipid Profile: No results for input(s): CHOL, HDL, LDLCALC, TRIG, CHOLHDL, LDLDIRECT in the last 72 hours. Thyroid Function Tests: No results for input(s): TSH, T4TOTAL, FREET4, T3FREE, THYROIDAB in the last 72 hours. Anemia Panel: No results for input(s): VITAMINB12, FOLATE, FERRITIN, TIBC, IRON, RETICCTPCT in the last 72 hours. Sepsis Labs: No results for input(s): PROCALCITON, LATICACIDVEN in the last 168 hours.  Recent Results (from the past 240 hour(s))  Culture, blood (Routine x 2)     Status: Abnormal   Collection Time: 07/12/16  2:37 PM  Result Value Ref Range Status   Specimen Description BLOOD RIGHT ANTECUBITAL  Final   Special Requests BOTTLES  DRAWN AEROBIC AND ANAEROBIC 5CC  Final   Culture  Setup Time   Final    GRAM NEGATIVE RODS IN BOTH AEROBIC AND ANAEROBIC BOTTLES Organism ID to follow CRITICAL RESULT CALLED TO, READ BACK BY AND VERIFIED WITH: GREG ABBOTT,PHARMD @0717  07/13/16 MKELLY,MLT    Culture ESCHERICHIA COLI (A)  Final   Report Status 07/15/2016 FINAL  Final   Organism ID, Bacteria ESCHERICHIA COLI  Final      Susceptibility   Escherichia coli - MIC*    AMPICILLIN <=2 SENSITIVE Sensitive     CEFAZOLIN <=4 SENSITIVE Sensitive     CEFEPIME <=1 SENSITIVE Sensitive     CEFTAZIDIME <=1 SENSITIVE Sensitive     CEFTRIAXONE <=1 SENSITIVE Sensitive     CIPROFLOXACIN <=0.25 SENSITIVE Sensitive     GENTAMICIN <=1 SENSITIVE Sensitive     IMIPENEM <=0.25 SENSITIVE Sensitive     TRIMETH/SULFA <=20 SENSITIVE Sensitive     AMPICILLIN/SULBACTAM <=2 SENSITIVE Sensitive     PIP/TAZO <=4 SENSITIVE Sensitive     Extended ESBL NEGATIVE Sensitive     * ESCHERICHIA COLI  Blood Culture ID Panel (Reflexed)     Status: Abnormal   Collection Time: 07/12/16  2:37 PM  Result Value Ref Range Status   Enterococcus species NOT DETECTED NOT DETECTED Final   Listeria monocytogenes NOT DETECTED NOT DETECTED Final   Staphylococcus species NOT DETECTED NOT DETECTED Final   Staphylococcus aureus NOT DETECTED NOT DETECTED Final   Streptococcus species NOT DETECTED NOT DETECTED Final   Streptococcus agalactiae NOT DETECTED NOT DETECTED Final   Streptococcus pneumoniae NOT DETECTED NOT DETECTED Final   Streptococcus pyogenes NOT DETECTED NOT DETECTED Final   Acinetobacter baumannii NOT DETECTED NOT DETECTED Final   Enterobacteriaceae species DETECTED (A) NOT DETECTED Final    Comment: CRITICAL RESULT CALLED TO, READ BACK BY AND VERIFIED WITH: GREG  ABBOTT, PHARMD @0717  07/13/16 MKELLY,MLT    Enterobacter cloacae complex NOT DETECTED NOT DETECTED Final   Escherichia coli DETECTED (A) NOT DETECTED Final    Comment: CRITICAL RESULT CALLED TO,  READ BACK BY AND VERIFIED WITH: GREG ABBOTT, PHARMD @0717  07/13/16 MKELLY,MLT    Klebsiella oxytoca NOT DETECTED NOT DETECTED Final   Klebsiella pneumoniae NOT DETECTED NOT DETECTED Final   Proteus species NOT DETECTED NOT DETECTED Final   Serratia marcescens NOT DETECTED NOT DETECTED Final   Carbapenem resistance NOT DETECTED NOT DETECTED Final   Haemophilus influenzae NOT DETECTED NOT DETECTED Final   Neisseria meningitidis NOT DETECTED NOT DETECTED Final   Pseudomonas aeruginosa NOT DETECTED NOT DETECTED Final   Candida albicans NOT DETECTED NOT DETECTED Final   Candida glabrata NOT DETECTED NOT DETECTED Final   Candida krusei NOT DETECTED NOT DETECTED Final   Candida parapsilosis NOT DETECTED NOT DETECTED Final   Candida tropicalis NOT DETECTED NOT DETECTED Final  Culture, blood (Routine x 2)     Status: Abnormal   Collection Time: 07/12/16  3:12 PM  Result Value Ref Range Status   Specimen Description BLOOD RIGHT FOREARM  Final   Special Requests BOTTLES DRAWN AEROBIC AND ANAEROBIC 5CC  Final   Culture  Setup Time   Final    GRAM NEGATIVE RODS IN BOTH AEROBIC AND ANAEROBIC BOTTLES CRITICAL VALUE NOTED.  VALUE IS CONSISTENT WITH PREVIOUSLY REPORTED AND CALLED VALUE.    Culture (A)  Final    ESCHERICHIA COLI SUSCEPTIBILITIES PERFORMED ON PREVIOUS CULTURE WITHIN THE LAST 5 DAYS.    Report Status 07/15/2016 FINAL  Final  Urine culture     Status: Abnormal   Collection Time: 07/12/16  3:21 PM  Result Value Ref Range Status   Specimen Description URINE, CLEAN CATCH  Final   Special Requests NONE  Final   Culture >=100,000 COLONIES/mL ESCHERICHIA COLI (A)  Final   Report Status 07/14/2016 FINAL  Final   Organism ID, Bacteria ESCHERICHIA COLI (A)  Final      Susceptibility   Escherichia coli - MIC*    AMPICILLIN <=2 SENSITIVE Sensitive     CEFAZOLIN <=4 SENSITIVE Sensitive     CEFTRIAXONE <=1 SENSITIVE Sensitive     CIPROFLOXACIN <=0.25 SENSITIVE Sensitive     GENTAMICIN <=1  SENSITIVE Sensitive     IMIPENEM <=0.25 SENSITIVE Sensitive     NITROFURANTOIN <=16 SENSITIVE Sensitive     TRIMETH/SULFA <=20 SENSITIVE Sensitive     AMPICILLIN/SULBACTAM <=2 SENSITIVE Sensitive     PIP/TAZO <=4 SENSITIVE Sensitive     Extended ESBL NEGATIVE Sensitive     * >=100,000 COLONIES/mL ESCHERICHIA COLI  MRSA PCR Screening     Status: None   Collection Time: 07/12/16  6:53 PM  Result Value Ref Range Status   MRSA by PCR NEGATIVE NEGATIVE Final    Comment:        The GeneXpert MRSA Assay (FDA approved for NASAL specimens only), is one component of a comprehensive MRSA colonization surveillance program. It is not intended to diagnose MRSA infection nor to guide or monitor treatment for MRSA infections.   Culture, sputum-assessment     Status: None   Collection Time: 07/13/16  6:29 AM  Result Value Ref Range Status   Specimen Description EXPECTORATED SPUTUM  Final   Special Requests NONE  Final   Sputum evaluation   Final    Sputum specimen not acceptable for testing.  Please recollect.   RESULT CALLED TO, READ BACK BY AND VERIFIED  WITH: Luella Cook, NURSE AT 1035 ON [id number] BY Rhea Bleacher    Report Status 07/13/2016 FINAL  Final         Radiology Studies: No results found.      Scheduled Meds: . allopurinol  300 mg Oral Daily  . atorvastatin  10 mg Oral Daily  . cephALEXin  250 mg Oral Q8H  . hydroxychloroquine  200 mg Oral BID  . neomycin-polymyxin b-dexamethasone  1 application Both Eyes QHS  . nitroGLYCERIN      . omega-3 acid ethyl esters  1 g Oral Daily  . polyvinyl alcohol  1 drop Both Eyes QHS  . potassium chloride  40 mEq Oral Daily   And  . potassium chloride  20 mEq Oral Daily  . sodium chloride flush  3 mL Intravenous Q12H  . torsemide  60 mg Oral q1800  . torsemide  80 mg Oral Daily   Continuous Infusions:   LOS: 9 days    Time spent: 30 minutes    Loretha Stapler, MD [facility] Pager [phone]  If 7PM-7AM, please  contact night-coverage [email or web address] Password [email or web address] 07/21/2016, 2:03 PM

## 2016-07-21 NOTE — Telephone Encounter (Signed)
Entered in error

## 2016-07-22 DIAGNOSIS — I5043 Acute on chronic combined systolic (congestive) and diastolic (congestive) heart failure: Secondary | ICD-10-CM

## 2016-07-22 LAB — CBC
HEMATOCRIT: 33.4 % — AB (ref 36.0–46.0)
HEMOGLOBIN: 10.1 g/dL — AB (ref 12.0–15.0)
MCH: 29 pg (ref 26.0–34.0)
MCHC: 30.2 g/dL (ref 30.0–36.0)
MCV: 96 fL (ref 78.0–100.0)
Platelets: 189 10*3/uL (ref 150–400)
RBC: 3.48 MIL/uL — ABNORMAL LOW (ref 3.87–5.11)
RDW: 16.5 % — AB (ref 11.5–15.5)
WBC: 9.7 10*3/uL (ref 4.0–10.5)

## 2016-07-22 LAB — BASIC METABOLIC PANEL
ANION GAP: 10 (ref 5–15)
BUN: 54 mg/dL — AB (ref 6–20)
CHLORIDE: 97 mmol/L — AB (ref 101–111)
CO2: 33 mmol/L — AB (ref 22–32)
Calcium: 9.4 mg/dL (ref 8.9–10.3)
Creatinine, Ser: 2.62 mg/dL — ABNORMAL HIGH (ref 0.44–1.00)
GFR calc Af Amer: 20 mL/min — ABNORMAL LOW (ref >60)
GFR calc non Af Amer: 18 mL/min — ABNORMAL LOW (ref >60)
GLUCOSE: 120 mg/dL — AB (ref 65–99)
POTASSIUM: 4 mmol/L (ref 3.5–5.1)
Sodium: 140 mmol/L (ref 135–145)

## 2016-07-22 MED ORDER — METOPROLOL SUCCINATE ER 25 MG PO TB24: 12.5000 mg | ORAL_TABLET | Freq: Every day | ORAL | 0 refills | Status: DC

## 2016-07-22 MED ORDER — METOPROLOL SUCCINATE ER 25 MG PO TB24
12.5000 mg | ORAL_TABLET | Freq: Every day | ORAL | Status: DC
  Administered 2016-07-22: 12.5 mg via ORAL
  Filled 2016-07-22: qty 1

## 2016-07-22 MED ORDER — METOPROLOL SUCCINATE ER 25 MG PO TB24: 12.5000 mg | ORAL_TABLET | Freq: Every day | ORAL | Status: DC

## 2016-07-22 NOTE — Plan of Care (Signed)
Problem: Safety: Goal: Ability to remain free from injury will improve Outcome: Progressing No safety issues noted  Problem: Pain Managment: Goal: General experience of comfort will improve Outcome: Progressing Denies pain  Problem: Tissue Perfusion: Goal: Risk factors for ineffective tissue perfusion will decrease Outcome: Progressing No S/S of DVT noted  Problem: Bowel/Gastric: Goal: Will not experience complications related to bowel motility Outcome: Progressing Denies gastric and bowel issues  Problem: Activity: Goal: Capacity to carry out activities will improve Outcome: Progressing OOB to [facility] with assistance and tolerated well

## 2016-07-22 NOTE — Progress Notes (Signed)
PROGRESS NOTE    April Mueller  [id number] DOB: 02-Aug-1946 DOA: 07/12/2016 PCP: Melinda Crutch, MD    Brief Narrative:  70 year old female with past medical history significant for CKD stage III, COPD, chronic respiratory failure, pulmonary hypertension group 2. Pt presented to hospital 07/12/16 with fevers, chills, nausea and poor by mouth intake for 2 days prior to this admission. She also had shortness of breath, dizziness and lightheadedness. Critical care was consulted by ED physician due to concern for volume status with sepsis and respiratory failure. She was started on empiric antibiotics while awaiting culture results. Patient subsequently found to have Escherichia coli UTI and bacteremia.  TRH assumed care 07/16/2016.  Patient was diuresed and was initially stable for discharge on 12/14 however due to low blood pressures [facility] did not feel comfortable accepting patient with hypotension.  Toprol was held due to low blood pressures however patient later developed palpitations and chest pain.  Toprol was restarted on 07/22/16 in pm.   Assessment & Plan:   Active Problems:   Secondary pulmonary arterial hypertension   Rheumatoid arthritis (HCC)   COPD   OSA on CPAP   Chronic diastolic heart failure (HCC)   Chronic respiratory failure with hypoxia (HCC)   Normocytic anemia   CKD (chronic kidney disease) stage 3, GFR 30-59 ml/min   HTN (hypertension)   Myelodysplastic syndrome (HCC)   Persistent atrial fibrillation (HCC)   Sepsis (HCC)   Thrombocytopenia (HCC)   Urinary tract infection with hematuria   Fever   Hypotension   Acute on chronic respiratory failure with hypoxia (HCC)   Sepsis due to Escherichia coli (HCC)    Sepsis secondary to Escherichia coli UTI and Escherichia coli bacteremia / Leukocytosis  - Blood cx and urine cx from 12/7 are growing E.Coli - Last day of Keflex    Acute on chronic diastolic CHF - 2-D echo with ejection fraction 55%, mild MR,  mildly decreased RV systolic function - Per cardiology continue PO diuretics 80mg  torsemide qAM and 60mg  qPM - Continue daily weight and strict intake and output - HF team is following  - creatinine stable this am    Diarrhea Told nurse to monitor No fever, no elevated WBC, no abdominal pain   Rheumatoid arthritis (HCC) - On Plaquenil at home    Acute on chronic respiratory failure with hypoxia (HCC) / COPD, OSA on CPAP / Secondary pulmonary arterial hypertension - CPAP intermittently. Currently on nasal cannula oxygen - Baseline is 3-4L [place] - Respiratory status stable      Acute renal failure superimposed on CKD (chronic kidney disease) stage 3, GFR 30-59 ml/min - Baseline creatinine between 2 and 2.2 - Creatinine at 2.62 this am - Monitor renal function daily since patient on Lasix    Troponin elevation - Likely demand ischemia due to CKD, sepsis - Cardio following     Dyslipidemia - Continue atorvastatin     Hypokalemia - Due to Laisx  - Continue to supplement with 76mEq BID - potassium WNL this am    HTN (hypertension), essential  - On IV lasix     Persistent atrial fibrillation (HCC) - CHADS vasc 4 - Has been taken off of Eliquis in past by cardio in the setting of GI bleed  - Toprol to be restarted this PM      Anemia of chronic kidney disease / IDA - Hemoglobin stable - Pt follows with Dr. Irene Limbo of hematology - She was taken off of Eliquis in the setting of  chronic GI bleed     Thrombocytopenia (HCC) / Myelodysplastic syndrome ([place]) - Likely related to MDS -  She follows with Dr. Irene Limbo of hematology/ oncology    DVT prophylaxis: SCD's bilaterally  Code Status: full code  Family Communication: no family at the bedside this am Disposition Plan: plan to go to SNF if blood pressures stable tomorrow  Consultants:   Cardiology  PT/OT  Procedures:   Intermittent Cpap  Antimicrobials:   Rocephin 12/8>12/14  Vanco, Zosyn 12/7,  12/8  Meropenem 12/8  Keflex 12/14> 12/17   Subjective: Patient seen and evaluated.  She is very teary and anxious.  She is worried that her blood pressures will go low again and is fearful of discharging prior to knowing how her body will react to the toprol being restarted. Voices she is having diarrhea.   Objective: Vitals:   07/21/16 1202 07/21/16 2021 07/22/16 0445 07/22/16 1149  BP: 102/62 (!) 96/53 120/63 (!) 120/54  Pulse: 73 88 64 87  Resp: 18 18 18 18   Temp: 98.1 F (36.7 C) 98.6 F (37 C) 98.6 F (37 C) 98.2 F (36.8 C)  TempSrc: Oral Oral Oral Oral  SpO2: 97% 97% 97% 97%  Weight:   89 kg (196 lb 4.8 oz)   Height:        Intake/Output Summary (Last 24 hours) at 07/22/16 1345 Last data filed at 07/22/16 1330  Gross per 24 hour  Intake              900 ml  Output             1201 ml  Net             -301 ml   Filed Weights   07/20/16 0442 07/21/16 0510 07/22/16 0445  Weight: 89.9 kg (198 lb 3.2 oz) 88.8 kg (195 lb 11.2 oz) 89 kg (196 lb 4.8 oz)    Examination:  General exam: Appears calm and comfortable  Respiratory system: Respiratory effort normal. Few crackles at bases Cardiovascular system: S1 & S2 heard, IRR. III/VI systolic murmur best heard in mitral area. Gastrointestinal system: Abdomen is nondistended, soft and nontender. No organomegaly or masses felt. Normal bowel sounds heard. Central nervous system: Alert and oriented. No focal neurological deficits. Extremities: Symmetric 5 x 5 power. Skin: No rashes, lesions or ulcers Psychiatry: Judgement and insight appear normal. Mood & affect appropriate.     Data Reviewed: I have personally reviewed following labs and imaging studies  CBC:  Recent Labs Lab 07/17/16 0246 07/18/16 0216 07/19/16 0236 07/20/16 0307 07/21/16 0223 07/22/16 0849  WBC 10.4 11.7* 11.0* 9.4 9.5 9.7  NEUTROABS 8.2* 9.4* 8.7* 7.3 7.2  --   HGB 10.5* 10.5* 10.2* 10.5* 10.3* 10.1*  HCT 34.1* 33.9* 33.2* 34.3* 33.3*  33.4*  MCV 94.2 94.7 94.3 94.0 94.3 96.0  PLT 74* 94* 142* 164 181 [id number]   Basic Metabolic Panel:  Recent Labs Lab 07/16/16 0440 07/17/16 0246 07/18/16 0216 07/19/16 0236 07/20/16 0307 07/21/16 0223 07/22/16 0849  NA 139 140 140 138 140 138 140  K 3.1* 3.0* 3.4* 3.8 3.5 3.7 4.0  CL 99* 98* 98* 95* 94* 94* 97*  CO2 30 31 31  32 36* 34* 33*  GLUCOSE 100* 117* 140* 119* 98 111* 120*  BUN 68* 68* 67* 64* 62* 61* 54*  CREATININE 2.43* 2.42* 2.17* 2.25* 2.38* 2.66* 2.62*  CALCIUM 9.3 9.2 9.6 9.5 9.8 9.4 9.4  MG 2.1 2.1 1.9  --   --   --   --  PHOS 3.1 3.1 2.4*  --   --   --   --    GFR: Estimated Creatinine Clearance: 22.2 mL/min (by C-G formula based on SCr of 2.62 mg/dL (H)). Liver Function Tests: No results for input(s): AST, ALT, ALKPHOS, BILITOT, PROT, ALBUMIN in the last 168 hours. No results for input(s): LIPASE, AMYLASE in the last 168 hours. No results for input(s): AMMONIA in the last 168 hours. Coagulation Profile: No results for input(s): INR, PROTIME in the last 168 hours. Cardiac Enzymes:  Recent Labs Lab 07/21/16 1119  TROPONINI 0.04*   BNP (last 3 results) No results for input(s): PROBNP in the last 8760 hours. HbA1C: No results for input(s): HGBA1C in the last 72 hours. CBG: No results for input(s): GLUCAP in the last 168 hours. Lipid Profile: No results for input(s): CHOL, HDL, LDLCALC, TRIG, CHOLHDL, LDLDIRECT in the last 72 hours. Thyroid Function Tests: No results for input(s): TSH, T4TOTAL, FREET4, T3FREE, THYROIDAB in the last 72 hours. Anemia Panel: No results for input(s): VITAMINB12, FOLATE, FERRITIN, TIBC, IRON, RETICCTPCT in the last 72 hours. Sepsis Labs: No results for input(s): PROCALCITON, LATICACIDVEN in the last 168 hours.  Recent Results (from the past 240 hour(s))  Culture, blood (Routine x 2)     Status: Abnormal   Collection Time: 07/12/16  2:37 PM  Result Value Ref Range Status   Specimen Description BLOOD RIGHT ANTECUBITAL   Final   Special Requests BOTTLES DRAWN AEROBIC AND ANAEROBIC 5CC  Final   Culture  Setup Time   Final    GRAM NEGATIVE RODS IN BOTH AEROBIC AND ANAEROBIC BOTTLES Organism ID to follow CRITICAL RESULT CALLED TO, READ BACK BY AND VERIFIED WITH: GREG ABBOTT,PHARMD @0717  07/13/16 MKELLY,MLT    Culture ESCHERICHIA COLI (A)  Final   Report Status 07/15/2016 FINAL  Final   Organism ID, Bacteria ESCHERICHIA COLI  Final      Susceptibility   Escherichia coli - MIC*    AMPICILLIN <=2 SENSITIVE Sensitive     CEFAZOLIN <=4 SENSITIVE Sensitive     CEFEPIME <=1 SENSITIVE Sensitive     CEFTAZIDIME <=1 SENSITIVE Sensitive     CEFTRIAXONE <=1 SENSITIVE Sensitive     CIPROFLOXACIN <=0.25 SENSITIVE Sensitive     GENTAMICIN <=1 SENSITIVE Sensitive     IMIPENEM <=0.25 SENSITIVE Sensitive     TRIMETH/SULFA <=20 SENSITIVE Sensitive     AMPICILLIN/SULBACTAM <=2 SENSITIVE Sensitive     PIP/TAZO <=4 SENSITIVE Sensitive     Extended ESBL NEGATIVE Sensitive     * ESCHERICHIA COLI  Blood Culture ID Panel (Reflexed)     Status: Abnormal   Collection Time: 07/12/16  2:37 PM  Result Value Ref Range Status   Enterococcus species NOT DETECTED NOT DETECTED Final   Listeria monocytogenes NOT DETECTED NOT DETECTED Final   Staphylococcus species NOT DETECTED NOT DETECTED Final   Staphylococcus aureus NOT DETECTED NOT DETECTED Final   Streptococcus species NOT DETECTED NOT DETECTED Final   Streptococcus agalactiae NOT DETECTED NOT DETECTED Final   Streptococcus pneumoniae NOT DETECTED NOT DETECTED Final   Streptococcus pyogenes NOT DETECTED NOT DETECTED Final   Acinetobacter baumannii NOT DETECTED NOT DETECTED Final   Enterobacteriaceae species DETECTED (A) NOT DETECTED Final    Comment: CRITICAL RESULT CALLED TO, READ BACK BY AND VERIFIED WITH: GREG ABBOTT, PHARMD @0717  07/13/16 MKELLY,MLT    Enterobacter cloacae complex NOT DETECTED NOT DETECTED Final   Escherichia coli DETECTED (A) NOT DETECTED Final     Comment: CRITICAL RESULT CALLED TO,  READ BACK BY AND VERIFIED WITH: GREG ABBOTT, PHARMD @0717  07/13/16 MKELLY,MLT    Klebsiella oxytoca NOT DETECTED NOT DETECTED Final   Klebsiella pneumoniae NOT DETECTED NOT DETECTED Final   Proteus species NOT DETECTED NOT DETECTED Final   Serratia marcescens NOT DETECTED NOT DETECTED Final   Carbapenem resistance NOT DETECTED NOT DETECTED Final   Haemophilus influenzae NOT DETECTED NOT DETECTED Final   Neisseria meningitidis NOT DETECTED NOT DETECTED Final   Pseudomonas aeruginosa NOT DETECTED NOT DETECTED Final   Candida albicans NOT DETECTED NOT DETECTED Final   Candida glabrata NOT DETECTED NOT DETECTED Final   Candida krusei NOT DETECTED NOT DETECTED Final   Candida parapsilosis NOT DETECTED NOT DETECTED Final   Candida tropicalis NOT DETECTED NOT DETECTED Final  Culture, blood (Routine x 2)     Status: Abnormal   Collection Time: 07/12/16  3:12 PM  Result Value Ref Range Status   Specimen Description BLOOD RIGHT FOREARM  Final   Special Requests BOTTLES DRAWN AEROBIC AND ANAEROBIC 5CC  Final   Culture  Setup Time   Final    GRAM NEGATIVE RODS IN BOTH AEROBIC AND ANAEROBIC BOTTLES CRITICAL VALUE NOTED.  VALUE IS CONSISTENT WITH PREVIOUSLY REPORTED AND CALLED VALUE.    Culture (A)  Final    ESCHERICHIA COLI SUSCEPTIBILITIES PERFORMED ON PREVIOUS CULTURE WITHIN THE LAST 5 DAYS.    Report Status 07/15/2016 FINAL  Final  Urine culture     Status: Abnormal   Collection Time: 07/12/16  3:21 PM  Result Value Ref Range Status   Specimen Description URINE, CLEAN CATCH  Final   Special Requests NONE  Final   Culture >=100,000 COLONIES/mL ESCHERICHIA COLI (A)  Final   Report Status 07/14/2016 FINAL  Final   Organism ID, Bacteria ESCHERICHIA COLI (A)  Final      Susceptibility   Escherichia coli - MIC*    AMPICILLIN <=2 SENSITIVE Sensitive     CEFAZOLIN <=4 SENSITIVE Sensitive     CEFTRIAXONE <=1 SENSITIVE Sensitive     CIPROFLOXACIN <=0.25  SENSITIVE Sensitive     GENTAMICIN <=1 SENSITIVE Sensitive     IMIPENEM <=0.25 SENSITIVE Sensitive     NITROFURANTOIN <=16 SENSITIVE Sensitive     TRIMETH/SULFA <=20 SENSITIVE Sensitive     AMPICILLIN/SULBACTAM <=2 SENSITIVE Sensitive     PIP/TAZO <=4 SENSITIVE Sensitive     Extended ESBL NEGATIVE Sensitive     * >=100,000 COLONIES/mL ESCHERICHIA COLI  MRSA PCR Screening     Status: None   Collection Time: 07/12/16  6:53 PM  Result Value Ref Range Status   MRSA by PCR NEGATIVE NEGATIVE Final    Comment:        The GeneXpert MRSA Assay (FDA approved for NASAL specimens only), is one component of a comprehensive MRSA colonization surveillance program. It is not intended to diagnose MRSA infection nor to guide or monitor treatment for MRSA infections.   Culture, sputum-assessment     Status: None   Collection Time: 07/13/16  6:29 AM  Result Value Ref Range Status   Specimen Description EXPECTORATED SPUTUM  Final   Special Requests NONE  Final   Sputum evaluation   Final    Sputum specimen not acceptable for testing.  Please recollect.   RESULT CALLED TO, READ BACK BY AND VERIFIED WITH: Luella Cook, NURSE AT 1035 ON [id number] BY Rhea Bleacher    Report Status 07/13/2016 FINAL  Final         Radiology Studies: No results found.  Scheduled Meds: . allopurinol  300 mg Oral Daily  . atorvastatin  10 mg Oral Daily  . hydroxychloroquine  200 mg Oral BID  . metoprolol succinate  12.5 mg Oral Daily  . neomycin-polymyxin b-dexamethasone  1 application Both Eyes QHS  . omega-3 acid ethyl esters  1 g Oral Daily  . polyvinyl alcohol  1 drop Both Eyes QHS  . potassium chloride  40 mEq Oral Daily   And  . potassium chloride  20 mEq Oral Daily  . sodium chloride flush  3 mL Intravenous Q12H  . torsemide  60 mg Oral q1800  . torsemide  80 mg Oral Daily   Continuous Infusions:   LOS: 10 days    Time spent: 30 minutes    Loretha Stapler, MD [facility] Pager  [phone]  If 7PM-7AM, please contact night-coverage [email or web address] Password [email or web address] 07/22/2016, 1:45 PM

## 2016-07-22 NOTE — Progress Notes (Signed)
Pt states she is nervous about going to rehab very tearful.

## 2016-07-22 NOTE — Progress Notes (Signed)
Patient Name: April Mueller Date of Encounter: 07/22/2016  Primary Cardiologist: Dr. Selmer Dominion Problem List     Active Problems:   Secondary pulmonary arterial hypertension   Rheumatoid arthritis ([place])   COPD   OSA on CPAP   Chronic diastolic heart failure (HCC)   Chronic respiratory failure with hypoxia (HCC)   Normocytic anemia   CKD (chronic kidney disease) stage 3, GFR 30-59 ml/min   HTN (hypertension)   Myelodysplastic syndrome (HCC)   Persistent atrial fibrillation (HCC)   Sepsis (HCC)   Thrombocytopenia (HCC)   Urinary tract infection with hematuria   Fever   Hypotension   Acute on chronic respiratory failure with hypoxia ([place])   Sepsis due to Escherichia coli ([place])     Subjective   Plan was for discharge to SNF but started feeling very weak and dizzy with chest pain, palpitations and SOB yesterday and discharge put off. Feeling a little better today but still with chest pain and palpitations.   Inpatient Medications    Scheduled Meds: . allopurinol  300 mg Oral Daily  . atorvastatin  10 mg Oral Daily  . hydroxychloroquine  200 mg Oral BID  . neomycin-polymyxin b-dexamethasone  1 application Both Eyes QHS  . omega-3 acid ethyl esters  1 g Oral Daily  . polyvinyl alcohol  1 drop Both Eyes QHS  . potassium chloride  40 mEq Oral Daily   And  . potassium chloride  20 mEq Oral Daily  . sodium chloride flush  3 mL Intravenous Q12H  . torsemide  60 mg Oral q1800  . torsemide  80 mg Oral Daily   Continuous Infusions:  PRN Meds: acetaminophen, albuterol, loperamide, metoprolol, ondansetron **OR** ondansetron (ZOFRAN) IV   Vital Signs    Vitals:   07/21/16 0913 07/21/16 1202 07/21/16 2021 07/22/16 0445  BP: (!) 86/60 102/62 (!) 96/53 120/63  Pulse:  73 88 64  Resp:  18 18 18   Temp:  98.1 F (36.7 C) 98.6 F (37 C) 98.6 F (37 C)  TempSrc:  Oral Oral Oral  SpO2:  97% 97% 97%  Weight:    196 lb 4.8 oz (89 kg)  Height:         Intake/Output Summary (Last 24 hours) at 07/22/16 0829 Last data filed at 07/22/16 0448  Gross per 24 hour  Intake              900 ml  Output             1350 ml  Net             -450 ml   Filed Weights   07/20/16 0442 07/21/16 0510 07/22/16 0445  Weight: 198 lb 3.2 oz (89.9 kg) 195 lb 11.2 oz (88.8 kg) 196 lb 4.8 oz (89 kg)    Physical Exam   GEN: Well nourished, well developed, in no acute distress. Chronically ill appearing HEENT: Grossly normal.  Neck: Supple, no JVD, carotid bruits, or masses. Cardiac: irreg irreg, +murmur, rubs, or gallops. No clubbing, cyanosis, edema.  Radials/DP/PT 2+ and equal bilaterally.  Respiratory:  Respirations regular and unlabored, clear to auscultation bilaterally. GI: Soft, nontender, nondistended, BS + x 4. MS: no deformity or atrophy. Skin: warm and dry, no rash. Neuro:  Strength and sensation are intact. Psych: AAOx3.  Normal affect.  Labs    CBC  Recent Labs  07/20/16 0307 07/21/16 0223  WBC 9.4 9.5  NEUTROABS 7.3 7.2  HGB 10.5* 10.3*  HCT 34.3* 33.3*  MCV 94.0 94.3  PLT 164 0000000   Basic Metabolic Panel  Recent Labs  07/20/16 0307 07/21/16 0223  NA 140 138  K 3.5 3.7  CL 94* 94*  CO2 36* 34*  GLUCOSE 98 111*  BUN 62* 61*  CREATININE 2.38* 2.66*  CALCIUM 9.8 9.4     Telemetry    afib with PVCs - Personally Reviewed  ECG    afib with PVCs and prolonged QT- Personally Reviewed  Radiology    No results found.  Cardiac Studies   2D ECHO: 07/13/2016 LV EF: 55% -   60% Study Conclusions - Left ventricle: The cavity size was normal. There was mild   concentric hypertrophy. Systolic function was normal. The   estimated ejection fraction was in the range of 55% to 60%. Wall   motion was normal; there were no regional wall motion   abnormalities. Features are consistent with a pseudonormal left   ventricular filling pattern, with concomitant abnormal relaxation   and increased filling pressure (grade 2  diastolic dysfunction).   Doppler parameters are consistent with high ventricular filling   pressure. - Descending aorta: The descending aorta was mildly dilated. - Mitral valve: There was mild regurgitation. - Left atrium: The atrium was severely dilated. - Right ventricle: Systolic function was mildly reduced. 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 - Tricuspid valve: There was mild-moderate regurgitation. - Pulmonary arteries: PA peak pressure: 66 mm Hg (S). Impressions: - The right ventricular systolic pressure was increased consistent   with moderate pulmonary hypertension.   Patient Profile     70 y.o.femalewith history of Chronic Diastolic CHF LVEF 0000000 (Moderate TR and Cor Pulmonale), severe COPD, CKD stage III, previous dissection s/p repair (2005), Chronic respiratory failure, and PAH.   Admitted with ecoli urosespis. WBC 17.5. Procalcitonin 40.47.Transferred to ICU on 12/8 for respiratory failure and volume overload.   Transitioned to po diuretics 07/17/16.  Weight stable.   Plan was for discharge to SNF but now feeling very weak and dizzy with chest pain, palpitations and SOB.   Assessment & Plan    1. Sepsis 2/2 E-Coli Bacteremia due to UTI.  UCx and BCx both positive for E-Coli - Continue Ceftriaxone per primary. Got Vanc/Zosyn on admission.  - CT ABD/Pelvis with scattered hepatic cysts (increased in size from prior exam), bilateral renal cysts, ?inflamatory changes around head of pancreas, Normal appendix, no acute colitis or diverticulitis.  - CCM signed off. Now on IM service.   2. Acute on Chronic Diastolic CHF: Echo [id number] with EF 55-60%, mild MR, PASP 66 mmHg, mildly decreased RV systolic function.  - Volume status stable. Creatinine near baseline.  - Creatinine 2.66/ BUN 61 yesterday. BMET pending today.  - Continue torsemide 80 mg am and 60 mg in  pm.   3. Acute on chronic hypoxic respiratory failure: Baseline COPD and restriction from body habitus, on 3-4L home oxygen.  Suspect pulmonary edema as cause for worsening.  Much improved after diuresis.  - Currently on 6 liters oxygen.   - Diuretics as above  4. ARF on CKD stage III: Creatinine 2.42 -> 2.17 -> 2.25 -> 2.38 -> 2.66. Baseline ~2.0 - 2.2  5. MR: Suspected to be severe in past but more recently has seemed less significant despite murmur.  Mild to moderate MR by Echo 1/17, echo this admission with mild MR.  6. Atrial Fib: Chronic.  Rate back down with treatment of sepsis.   - Toprol XL 12.5 daily discontinued 2/2 symptomatic hypotension yesterday (BP 86/60). Given 250cc bolus and now BP better. Will restart Toprol XL 12.5 daily.  - Not AC candidate with recurrent severe GI bleeding.   7. PAH: primarily Gregory group 2 based on [place] 3/17.  Not on pulmonary vasodilators.   8. Hypokalemia: Supplement potassium.   9. Deconditioning: PT recommending SNF. Was supposed to go 12/15 but started feeling poorly and found to be hypotensive.  10. Symptomatic hypotension: resolved after fluids and holding toprol. She had a JVP of about 8 which would seem adequate but with her pulmonary HTN, she was felt to possibly be underfilling her LV. She was given 250 cc NS with some improvement of BP.   11. Palpitations: she has chronic afib and now with PVCs. Cannot titrate meds 2/2 low BP  12. Chest pain: troponin flat and low c/w previous elevations. ECG with no acute changes.   Signed, Angelena Form, PA-C  07/22/2016, 8:29 AM   Patient seen with PA, agree with the above note.  She is stable.  BP fluctuates but not particularly symptomatic.  Palpitations due to PVCs with chronic atrial fibrillation.  Does not need RHC.  Restart Toprol XL 12.5 mg daily, give in the evening.   No labs done today.  If creatinine stable to lower, no change to diuretic.  If rising creatinine, may have to hold  evening dose.   Stable for SNF from my standpoint as long as creatinine not significantly higher.  [name] 07/22/2016 8:53 AM

## 2016-07-23 DIAGNOSIS — D696 Thrombocytopenia, unspecified: Secondary | ICD-10-CM

## 2016-07-23 DIAGNOSIS — R0902 Hypoxemia: Secondary | ICD-10-CM

## 2016-07-23 DIAGNOSIS — J9620 Acute and chronic respiratory failure, unspecified whether with hypoxia or hypercapnia: Secondary | ICD-10-CM

## 2016-07-23 DIAGNOSIS — I2789 Other specified pulmonary heart diseases: Secondary | ICD-10-CM

## 2016-07-23 LAB — CBC
HCT: 32.8 % — ABNORMAL LOW (ref 36.0–46.0)
Hemoglobin: 10.2 g/dL — ABNORMAL LOW (ref 12.0–15.0)
MCH: 29.3 pg (ref 26.0–34.0)
MCHC: 31.1 g/dL (ref 30.0–36.0)
MCV: 94.3 fL (ref 78.0–100.0)
PLATELETS: 172 10*3/uL (ref 150–400)
RBC: 3.48 MIL/uL — ABNORMAL LOW (ref 3.87–5.11)
RDW: 16.6 % — AB (ref 11.5–15.5)
WBC: 8.9 10*3/uL (ref 4.0–10.5)

## 2016-07-23 LAB — BASIC METABOLIC PANEL
Anion gap: 11 (ref 5–15)
BUN: 55 mg/dL — AB (ref 6–20)
CALCIUM: 9.5 mg/dL (ref 8.9–10.3)
CO2: 31 mmol/L (ref 22–32)
Chloride: 97 mmol/L — ABNORMAL LOW (ref 101–111)
Creatinine, Ser: 2.76 mg/dL — ABNORMAL HIGH (ref 0.44–1.00)
GFR calc Af Amer: 19 mL/min — ABNORMAL LOW (ref >60)
GFR calc non Af Amer: 17 mL/min — ABNORMAL LOW (ref >60)
GLUCOSE: 111 mg/dL — AB (ref 65–99)
Potassium: 4.3 mmol/L (ref 3.5–5.1)
Sodium: 139 mmol/L (ref 135–145)

## 2016-07-23 MED ORDER — TORSEMIDE 20 MG PO TABS
60.0000 mg | ORAL_TABLET | Freq: Two times a day (BID) | ORAL | Status: DC
Start: 2016-07-23 — End: 2016-07-23
  Administered 2016-07-23: 60 mg via ORAL
  Filled 2016-07-23: qty 3

## 2016-07-23 MED ORDER — TORSEMIDE 20 MG PO TABS: 60.0000 mg | ORAL_TABLET | Freq: Two times a day (BID) | ORAL | 0 refills | Status: DC

## 2016-07-23 MED ORDER — METOPROLOL SUCCINATE ER 25 MG PO TB24
12.5000 mg | ORAL_TABLET | Freq: Two times a day (BID) | ORAL | Status: DC
  Administered 2016-07-23: 12.5 mg via ORAL
  Filled 2016-07-23: qty 1

## 2016-07-23 MED ORDER — CIPROFLOXACIN HCL 500 MG PO TABS: 500.0000 mg | ORAL_TABLET | Freq: Every day | ORAL | 0 refills | Status: DC

## 2016-07-23 MED ORDER — CIPROFLOXACIN HCL 500 MG PO TABS
500.0000 mg | ORAL_TABLET | Freq: Every day | ORAL | Status: DC
Start: 2016-07-23 — End: 2016-07-23
  Administered 2016-07-23: 500 mg via ORAL
  Filled 2016-07-23: qty 1

## 2016-07-23 MED ORDER — METOPROLOL SUCCINATE ER 25 MG PO TB24: 12.5000 mg | ORAL_TABLET | Freq: Two times a day (BID) | ORAL | 0 refills | Status: DC

## 2016-07-23 NOTE — Progress Notes (Signed)
Physical Therapy Treatment Patient Details Name: April Mueller MRN: [id number] DOB: [birth year] Today's Date: 07/23/2016    History of Present Illness Pt adm with sepsis due to UTI and acute on chronic diastolic heart failure. PMH - chronic resp failure, ckd, copd, chf    PT Comments    Pt able to ambulate 100 ft with rw on 6L O2, (SpO2 95% following). Pt does continue to fatigue quickly, requiring standing breaks X2 and one seated rest during session. Recommending SNF for further rehabilitation following acute stay.   Follow Up Recommendations  SNF     Equipment Recommendations  None recommended by PT    Recommendations for Other Services       Precautions / Restrictions Precautions Precautions: Fall Precaution Comments: 6L O2 PTA with activity Restrictions Weight Bearing Restrictions: No    Mobility  Bed Mobility Overal bed mobility: Needs Assistance Bed Mobility: Supine to Sit     Supine to sit: Supervision     General bed mobility comments: HOB elevated  Transfers Overall transfer level: Needs assistance Equipment used: Rolling walker (2 wheeled) Transfers: Sit to/from Stand Sit to Stand: Min guard         General transfer comment: cues for hand placement, repeat X2  Ambulation/Gait Ambulation/Gait assistance: Min guard Ambulation Distance (Feet): 100 Feet (+15'X1) Assistive device: Rolling walker (2 wheeled) Gait Pattern/deviations: Step-through pattern;Decreased step length - right;Decreased step length - left;Trunk flexed Gait velocity: decreased   General Gait Details: Pt taking 2 standing breaks, leaning forward onto rw for support. Ambulated 15 ft in room with HHA to chair. Using 6LO2 during ambulation, SpO2 95% following.    Stairs            Wheelchair Mobility    Modified Rankin (Stroke Patients Only)       Balance Overall balance assessment: Needs assistance Sitting-balance support: No upper extremity supported Sitting  balance-Leahy Scale: Good     Standing balance support: Single extremity supported Standing balance-Leahy Scale: Fair Standing balance comment: using rw during ambulation, able to stand static with single UE support                    Cognition Arousal/Alertness: Awake/alert Behavior During Therapy: WFL for tasks assessed/performed Overall Cognitive Status: Within Functional Limits for tasks assessed                      Exercises      General Comments        Pertinent Vitals/Pain Pain Assessment: No/denies pain    Home Living                      Prior Function            PT Goals (current goals can now be found in the care plan section) Acute Rehab PT Goals Patient Stated Goal: get more rehab before going home.  PT Goal Formulation: With patient Time For Goal Achievement: 07/31/16 Potential to Achieve Goals: Good Progress towards PT goals: Progressing toward goals    Frequency    Min 3X/week      PT Plan Current plan remains appropriate    Co-evaluation             End of Session Equipment Utilized During Treatment: Gait belt;Oxygen Activity Tolerance: Patient limited by fatigue;Other (comment) (reports mild dizziness at end of ambulation. ) Patient left: in chair;with call bell/phone within reach;with nursing/sitter in room  Time: [id number] PT Time Calculation (min) (ACUTE ONLY): 31 min  Charges:  $Gait Training: 23-37 mins                    G Codes:      Cassell Clement, PT, CSCS Pager [phone] Office [phone]  07/23/2016, 10:05 AM

## 2016-07-23 NOTE — Clinical Social Work Note (Addendum)
CSW met with patient. No supports at bedside. Patient was unable to discharge to [facility] on Friday due to dizziness and low BP. Patient's BP better today and patient reports that her dizziness is better. Will discharge to SNF once cleared by attending.  Dayton Scrape, CSW [phone]  1:27 pm Patient's BP down to 90/58 with a heart rate of 67. CSW notified SNF.  Dayton Scrape, [place]

## 2016-07-23 NOTE — Discharge Summary (Signed)
Physician Discharge Summary  LOURI JURGENSON [id number] DOB: 1946/06/15 DOA: 07/12/2016  PCP: Melinda Crutch, MD  Admit date: 07/12/2016 Discharge date: 07/23/2016  Admitted From: Home Disposition:  SNF  Recommendations for Outpatient Follow-up:  1. Follow up with PCP in 1-2 weeks 2. Please obtain BMP/CBC in one week 3. Ciprofloxacin 500 mg once daily x 4 days    Discharge Condition:Stable CODE STATUS:FULL Diet recommendation: Heart Healthy   Brief/Interim Summary: 70 year old female with past medical history significant for CKD stage III, COPD, chronic respiratory failure, pulmonary hypertension group 2. Pt presented to hospital 07/12/16 with fevers, chills, nausea and poor by mouth intake for 2 days prior to this admission. She also had shortness of breath, dizziness and lightheadedness. Critical care was consulted by ED physician due to concern for volume status with sepsis and respiratory failure. She was started on empiric antibiotics while awaiting culture results. Patient subsequently found to have Escherichia coli UTI and bacteremia.  TR[facility] assumed care 07/16/2016.  Patient was diuresed and was initially stable for discharge on 12/14 however due to low blood pressures [facility] did not feel comfortable accepting patient with hypotension.  Toprol was held due to low blood pressures however patient later developed palpitations and chest pain.  Toprol was restarted on 07/22/16 in pm and pt's BP remained stable  Discharge Diagnoses:  Sepsis secondary to Escherichia coli UTI and Escherichia coli bacteremia / Leukocytosis  - Blood cx and urine cx from 12/7 are growing E.Coli - 4 more days ciprofloxacin for bacteremia after d/c to complete 14 days of therapy  Acute on chronic diastolic CHF - 2-D echo with ejection fraction 55%, mild MR, mildly decreased RV systolic function - Per cardiology continue PO diuretics torsemide 60 mg bid - Continue daily weight and strict intake and  output - HF team is following  - creatinine stable  -discharge weight 196 lbs  Diarrhea Told nurse to monitor No fever, no elevated WBC, no abdominal pain  Rheumatoid arthritis (HCC) - On Plaquenil at home  Acute on chronic respiratory failure with hypoxia (HCC) / COPD, OSA on CPAP / Secondary pulmonary arterial hypertension - CPAP intermittently. Currently on nasal cannula oxygen - Baseline is 3-4L [place] prior to admission - Respiratory status stable   Acute renal failure superimposed on CKD (chronic kidney disease) stage 3, GFR 30-59 ml/min - Baseline creatinine between 2.2-2.4 - Creatinine at 2.76 this am - Monitor renal function daily sincepatient on Lasix  Troponin elevation - Likely demand ischemia due to CKD, sepsis - Cardio following   Dyslipidemia - Continue atorvastatin   Hypokalemia - Due to Laisx  - Continue to supplement with 66mEq BID - potassium WNL this am  HTN (hypertension), essential  - controlled -metoprolol succinate 12.5 mg bid  Persistent atrial fibrillation (HCC) - CHADS vasc 4 - Has been taken off of Eliquis in past by cardio in the setting of GI bleed  - Toprol to be restarted this PM  Anemia of chronic kidney disease / IDA - Hemoglobin stable - Pt follows with Dr. 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 Irene Limbo of hematology/ oncology     Code Status:full code    Consultants:   Cardiology  Procedures:   Intermittent Cpap  Antimicrobials:   Rocephin 12/8>12/14  Vanco, Zosyn 12/7, 12/8  Meropenem 12/8  Keflex 12/14> 12/17  ciprofloxacine  12/18>>>07/26/16    Discharge Instructions  Discharge Instructions    Activity as tolerated - No restrictions    Complete by:  As directed    Call MD for:  difficulty breathing, headache or  visual disturbances    Complete by:  As directed    Call MD for:  extreme fatigue    Complete by:  As directed    Call MD for:  hives    Complete by:  As directed    Call MD for:  persistant dizziness or light-headedness    Complete by:  As directed    Call MD for:  persistant nausea and vomiting    Complete by:  As directed    Call MD for:  severe uncontrolled pain    Complete by:  As directed    Call MD for:  temperature >100.4    Complete by:  As directed    Diet - low sodium heart healthy    Complete by:  As directed    Diet - low sodium heart healthy    Complete by:  As directed    Increase activity slowly    Complete by:  As directed    Increase activity slowly    Complete by:  As directed      Allergies as of 07/23/2016   No Known Allergies     Medication List    TAKE these medications   acetaminophen 325 MG tablet Commonly known as:  TYLENOL Take 2 tablets (650 mg total) by mouth every 6 (six) hours as needed for mild pain (or Fever >/= 101).   albuterol (2.5 MG/3ML) 0.083% nebulizer solution Commonly known as:  PROVENTIL Take 3 mLs (2.5 mg total) by nebulization every 4 (four) hours as needed for wheezing or shortness of breath.   ALPRAZolam 0.25 MG tablet Commonly known as:  XANAX Take 1 tablet (0.25 mg total) by mouth 2 (two) times daily as needed for anxiety or sleep.   atorvastatin 10 MG tablet Commonly known as:  LIPITOR TAKE 1 TABLET DAILY   carboxymethylcellulose 0.5 % Soln Commonly known as:  REFRESH PLUS Place 1 drop into both eyes at bedtime.   ciprofloxacin 500 MG tablet Commonly known as:  CIPRO Take 1 tablet (500 mg total) by mouth daily with breakfast. X 4 days   Fish Oil 1200 MG Caps Take 1,200 mg by mouth daily.   hydroxychloroquine 200 MG tablet Commonly known as:  PLAQUENIL Take 200 mg by mouth 2 (two) times daily.   metoprolol succinate 25 MG 24 hr tablet Commonly known as:  TOPROL-XL Take 0.5 tablets (12.5 mg total) by mouth  2 (two) times daily. At 1800 What changed:  See the new instructions.   neomycin-polymyxin b-dexamethasone 3.5-10000-0.1 Oint Commonly known as:  MAXITROL Place 1 application into both eyes at bedtime.   OXYGEN Inhale 3-6 L into the lungs continuous.   potassium chloride 10 MEQ tablet Commonly known as:  K-DUR Take 4 tabs in AM and 2 tabs in PM What changed:  how much to take  how to take this  when to take this  additional instructions   torsemide 20 MG tablet Commonly known as:  DEMADEX Take 3 tablets (60 mg total) by mouth 2 (two) times daily. What changed:  how much to take  how to take this  when to take this  additional instructions   ZYLOPRIM 300 MG tablet Generic drug:  allopurinol Take 300 mg by mouth daily.  Contact information for follow-up providers    Darrick Grinder, NP Follow up on 07/25/2016.   Specialty:  Cardiology Why:  at 2:40 [facility] Contact information: [address] [address] [phone]        Sullivan Lone, MD. Schedule an appointment as soon as possible for a visit in 3 week(s).   Specialties:  Hematology, Oncology Contact information: [place] [address] [phone]        Melinda Crutch, MD. Schedule an appointment as soon as possible for a visit in 2 week(s).   Specialty:  Family Medicine Contact information: [place] [address] [phone]            Contact information for after-discharge care    [facility] .   Specialty:  [place] information: [address] [place] [phone]                 No Known Allergies  Consultations:  cardiology   Procedures/Studies: Ct Abdomen Pelvis Wo Contrast  Result Date: 07/13/2016 CLINICAL DATA:  Sepsis, diarrhea EXAM: CT ABDOMEN AND PELVIS WITHOUT CONTRAST TECHNIQUE: Multidetector CT imaging of  the abdomen and pelvis was performed following the standard protocol without IV contrast. COMPARISON:  CT chest 05/06/2013 FINDINGS: Lower chest: There is dilatation of descending aorta up to 4.1 cm stable from prior exam. There is small right pleural effusion with right base posterior atelectasis. Mild bronchitic changes are noted bilateral lower lobe. Cardiomegaly is noted. Hepatobiliary: Study is limited without IV contrast. Unenhanced liver shows no biliary ductal dilatation. Scattered hepatic cysts are again noted with progression in size from prior exam. The largest cyst in anterior aspect of the right hepatic dome measures 4 cm. Largest cyst in lateral segment of left hepatic lobe measures 3.4 cm. No calcified gallstones are noted within gallbladder. There is micro nodular liver contour. Chronic liver disease or cirrhosis cannot be excluded. Pancreas: No focal abnormality is noted in unenhanced pancreas. Axial image 33 there is mild stranding of the fat surrounding the pancreatic head region. There is mild stranding of the fat inferior to pancreatic head region and just above the distal duodenum please see coronal image 66. Mild focal pancreatitis or duodenal inflammation cannot be excluded. Clinical correlation is necessary. Spleen: Unenhanced spleen is normal. Adrenals/Urinary Tract: Again noted mild thickening of left adrenal gland. The right adrenal gland is stable. There is a cyst in midpole posterior aspect of the left kidney measures 4.1 cm. There is cortical scarring midpole of the right kidney. A cyst in lower pole of the right kidney measures 4.4 cm. No hydronephrosis or hydroureter. No nephrolithiasis. No calcified calculi are noted within under distended urinary bladder. There is nonobstructive calcified calculus in lower pole of the left kidney measures 4 mm. Stomach/Bowel: Oral contrast material was given to the patient. There is no evidence of small bowel obstruction. No thickened or dilated  small bowel loops are noted. There is no pericecal inflammation. Normal appendix is partially visualized in axial image 51. Some colonic gas noted in transverse colon. Multiple descending colon diverticula. The descending colon and sigmoid colon are empty collapsed. Multiple colonic diverticula are noted sigmoid colon. There is redundant sigmoid colon. No evidence of acute pancreatitis. No distal colitis. Some liquid stool noted within rectum. Diarrhea cannot be excluded. Vascular/Lymphatic: No aortic aneurysm. Atherosclerotic calcifications of abdominal aorta and iliac arteries. Reproductive: The  uterus is atrophic. There is a cyst/follicle within right ovary measures 2 cm. Other: No free abdominal air.  No large abdominal ascites. Musculoskeletal: Sagittal images of the spine shows degenerative changes thoracolumbar spine. IMPRESSION: 1. Cardiomegaly is noted. Again noted aneurysmal dilatation of descending aorta stable from prior exam. Small right pleural effusion with right base posterior medially atelectasis. 2. Scattered hepatic cysts are noted progression in size from prior exam. 3. Bilateral renal cysts.  Left nonobstructive nephrolithiasis. 4. Micro nodular liver contour. Chronic liver disease or cirrhosis cannot be excluded. 5. There is subtle mild stranding of the fat surrounding the pancreatic head region in medial and inferior to duodenum. Mild focal pancreatitis or duodenal inflammation cannot be excluded. Clinical correlation is necessary. Study is limited without IV contrast. 6. No small bowel or colonic obstruction. No pericecal inflammation. Normal appendix. 7. Distal colonic diverticula. No evidence of acute colitis or diverticulitis. 8. There is a dominant follicle/cyst within right ovary measures 2 cm. Electronically Signed   By: Lahoma Crocker M.D.   On: 07/13/2016 13:43   Dg Chest 2 View  Result Date: 07/13/2016 CLINICAL DATA:  Fever.  Sepsis.  Shortness of breath. EXAM: CHEST  2 VIEW  COMPARISON:  07/12/2016 and 02/20/2016 and CT scan of the chest dated 05/06/2013 FINDINGS: Chronic cardiomegaly. Tortuosity and chronic aneurysmal dilatation of the thoracic aorta. No acute infiltrates or effusions. Slight linear scarring in both lungs, stable. No acute bone abnormality. IMPRESSION: No acute abnormalities. Chronic cardiomegaly. 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 Electronically Signed   By: Lorriane Shire M.D.   On: 07/13/2016 11:29   US Renal  Result Date: 07/13/2016 CLINICAL DATA:  Sepsis. EXAM: RENAL / URINARY TRACT ULTRASOUND COMPLETE COMPARISON:  None. FINDINGS: Right Kidney: Length: 11.0 cm. Thinning of the renal parenchyma with increased echogenicity. Capsular contours are lobular. No hydronephrosis. There is a 4.3 x 3.9 x 3.7 cm cyst from the lower kidney. Left Kidney: Length: 11.1 cm. There is increased renal echogenicity and mild lobular contours. No hydronephrosis. Two lower pole cysts in measuring 3.6 x 3.5 x 4.6 cm, with an adjacent 1.7 cm cyst. Bladder: Appears normal for degree of bladder distention. IMPRESSION: Findings consistent with chronic medical renal disease. No hydronephrosis. Bilateral renal cysts. Electronically Signed   By: Jeb Levering M.D.   On: 07/13/2016 03:23   Dg Chest Port 1 View  Result Date: 07/12/2016 CLINICAL DATA:  Sepsis.  Fever, shortness of Breath EXAM: PORTABLE CHEST 1 VIEW COMPARISON:  02/20/2016 FINDINGS: Cardiomegaly with vascular congestion and mild bilateral airspace opacities, left slightly greater than right. I favor this represents asymmetric edema. No visible effusions or acute bony abnormality. IMPRESSION: Suspect mild asymmetric pulmonary edema. Electronically Signed   By: Rolm Baptise M.D.   On: 07/12/2016 15:49   Dg Chest Port 1v Same Day  Result Date: 07/14/2016 CLINICAL DATA:  Respiratory failure with hypoxia EXAM: PORTABLE CHEST 1 VIEW COMPARISON:  07/13/2016 FINDINGS: Sternotomy wires unchanged.  Lungs are adequately inflated demonstrate patchy bilateral hilar opacification which may be due to mild interstitial edema, although cannot exclude infection. No evidence of effusion. Stable cardiomegaly. Remainder of the exam is unchanged. IMPRESSION: Patchy perihilar opacification which may be due to mild interstitial edema versus infection. Stable cardiomegaly. Electronically Signed   By: Marin Olp M.D.   On: 07/14/2016 13:29        Discharge Exam: Vitals:   07/22/16 2001 07/23/16 0432  BP: (!) 104/50 109/64  Pulse: 71 63  Resp: 18  18  Temp: 98.3 F (36.8 C) 98.4 F (36.9 C)   Vitals:   07/22/16 0445 07/22/16 1149 07/22/16 2001 07/23/16 0432  BP: 120/63 (!) 120/54 (!) 104/50 109/64  Pulse: 64 87 71 63  Resp: 18 18 18 18   Temp: 98.6 F (37 C) 98.2 F (36.8 C) 98.3 F (36.8 C) 98.4 F (36.9 C)  TempSrc: Oral Oral Oral Oral  SpO2: 97% 97% 95% 97%  Weight: 89 kg (196 lb 4.8 oz)   89.1 kg (196 lb 8 oz)  Height:        General: Pt is alert, awake, not in acute distress Cardiovascular: RRR, S1/S2 +, no rubs, no gallops Respiratory: CTA bilaterally, no wheezing, no rhonchi Abdominal: Soft, NT, ND, bowel sounds + Extremities: no edema, no cyanosis   The results of significant diagnostics from this hospitalization (including imaging, microbiology, ancillary and laboratory) are listed below for reference.    Significant Diagnostic Studies: Ct Abdomen Pelvis Wo Contrast  Result Date: 07/13/2016 CLINICAL DATA:  Sepsis, diarrhea EXAM: CT ABDOMEN AND PELVIS WITHOUT CONTRAST TECHNIQUE: Multidetector CT imaging of the abdomen and pelvis was performed following the standard protocol without IV contrast. COMPARISON:  CT chest 05/06/2013 FINDINGS: Lower chest: There is dilatation of descending aorta up to 4.1 cm stable from prior exam. There is small right pleural effusion with right base posterior atelectasis. Mild bronchitic changes are noted bilateral lower lobe. Cardiomegaly is  noted. Hepatobiliary: Study is limited without IV contrast. Unenhanced liver shows no biliary ductal dilatation. Scattered hepatic cysts are again noted with progression in size from prior exam. The largest cyst in anterior aspect of the right hepatic dome measures 4 cm. Largest cyst in lateral segment of left hepatic lobe measures 3.4 cm. No calcified gallstones are noted within gallbladder. There is micro nodular liver contour. Chronic liver disease or cirrhosis cannot be excluded. Pancreas: No focal abnormality is noted in unenhanced pancreas. Axial image 33 there is mild stranding of the fat surrounding the pancreatic head region. There is mild stranding of the fat inferior to pancreatic head region and just above the distal duodenum please see coronal image 66. Mild focal pancreatitis or duodenal inflammation cannot be excluded. Clinical correlation is necessary. Spleen: Unenhanced spleen is normal. Adrenals/Urinary Tract: Again noted mild thickening of left adrenal gland. The right adrenal gland is stable. There is a cyst in midpole posterior aspect of the left kidney measures 4.1 cm. There is cortical scarring midpole of the right kidney. A cyst in lower pole of the right kidney measures 4.4 cm. No hydronephrosis or hydroureter. No nephrolithiasis. No calcified calculi are noted within under distended urinary bladder. There is nonobstructive calcified calculus in lower pole of the left kidney measures 4 mm. Stomach/Bowel: Oral contrast material was given to the patient. There is no evidence of small bowel obstruction. No thickened or dilated small bowel loops are noted. There is no pericecal inflammation. Normal appendix is partially visualized in axial image 51. Some colonic gas noted in transverse colon. Multiple descending colon diverticula. The descending colon and sigmoid colon are empty collapsed. Multiple colonic diverticula are noted sigmoid colon. There is redundant sigmoid colon. No evidence of  acute pancreatitis. No distal colitis. Some liquid stool noted within rectum. Diarrhea cannot be excluded. Vascular/Lymphatic: No aortic aneurysm. Atherosclerotic calcifications of abdominal aorta and iliac arteries. Reproductive: The uterus is atrophic. There is a cyst/follicle within right ovary measures 2 cm. Other: No free abdominal air.  No large abdominal ascites. Musculoskeletal: Sagittal images of  the spine shows degenerative changes thoracolumbar spine. IMPRESSION: 1. Cardiomegaly is noted. Again noted aneurysmal dilatation of descending aorta stable from prior exam. Small right pleural effusion with right base posterior medially atelectasis. 2. Scattered hepatic cysts are noted progression in size from prior exam. 3. Bilateral renal cysts.  Left nonobstructive nephrolithiasis. 4. Micro nodular liver contour. Chronic liver disease or cirrhosis cannot be excluded. 5. There is subtle mild stranding of the fat surrounding the pancreatic head region in medial and inferior to duodenum. Mild focal pancreatitis or duodenal inflammation cannot be excluded. Clinical correlation is necessary. Study is limited without IV contrast. 6. No small bowel or colonic obstruction. No pericecal inflammation. Normal appendix. 7. Distal colonic diverticula. No evidence of acute colitis or diverticulitis. 8. There is a dominant follicle/cyst within right ovary measures 2 cm. Electronically Signed   By: Lahoma Crocker M.D.   On: 07/13/2016 13:43   Dg Chest 2 View  Result Date: 07/13/2016 CLINICAL DATA:  Fever.  Sepsis.  Shortness of breath. EXAM: CHEST  2 VIEW COMPARISON:  07/12/2016 and 02/20/2016 and CT scan of the chest dated 05/06/2013 FINDINGS: Chronic cardiomegaly. Tortuosity and chronic aneurysmal dilatation of the thoracic aorta. No acute infiltrates or effusions. Slight linear scarring in both lungs, stable. No acute bone abnormality. IMPRESSION: No acute abnormalities. Chronic cardiomegaly. Chronic aneurysmal dilatation  of the descending thoracic aorta. Electronically Signed   By: Lorriane Shire M.D.   On: 07/13/2016 11:29   US Renal  Result Date: 07/13/2016 CLINICAL DATA:  Sepsis. EXAM: RENAL / URINARY TRACT ULTRASOUND COMPLETE COMPARISON:  None. FINDINGS: Right Kidney: Length: 11.0 cm. Thinning of the renal parenchyma with increased echogenicity. Capsular contours are lobular. No hydronephrosis. There is a 4.3 x 3.9 x 3.7 cm cyst from the lower kidney. Left Kidney: Length: 11.1 cm. There is increased renal echogenicity and mild lobular contours. No hydronephrosis. Two lower pole cysts in measuring 3.6 x 3.5 x 4.6 cm, with an adjacent 1.7 cm cyst. Bladder: Appears normal for degree of bladder distention. IMPRESSION: Findings consistent with chronic medical renal disease. No hydronephrosis. Bilateral renal cysts. Electronically Signed   By: Jeb Levering M.D.   On: 07/13/2016 03:23   Dg Chest Port 1 View  Result Date: 07/12/2016 CLINICAL DATA:  Sepsis.  Fever, shortness of Breath EXAM: PORTABLE CHEST 1 VIEW COMPARISON:  02/20/2016 FINDINGS: Cardiomegaly with vascular congestion and mild bilateral airspace opacities, left slightly greater than right. I favor this represents asymmetric edema. No visible effusions or acute bony abnormality. IMPRESSION: Suspect mild asymmetric pulmonary edema. Electronically Signed   By: Rolm Baptise M.D.   On: 07/12/2016 15:49   Dg Chest Port 1v Same Day  Result Date: 07/14/2016 CLINICAL DATA:  Respiratory failure with hypoxia EXAM: PORTABLE CHEST 1 VIEW COMPARISON:  07/13/2016 FINDINGS: Sternotomy wires unchanged. Lungs are adequately inflated demonstrate patchy bilateral hilar opacification which may be due to mild interstitial edema, although cannot exclude infection. No evidence of effusion. Stable cardiomegaly. Remainder of the exam is unchanged. IMPRESSION: Patchy perihilar opacification which may be due to mild interstitial edema versus infection. Stable cardiomegaly.  Electronically Signed   By: Marin Olp M.D.   On: 07/14/2016 13:29     Microbiology: No results found for this or any previous visit (from the past 240 hour(s)).   Labs: Basic Metabolic Panel:  Recent Labs Lab 07/17/16 0246 07/18/16 0216 07/19/16 0236 07/20/16 0307 07/21/16 0223 07/22/16 0849 07/23/16 0507  NA 140 140 138 140 138 140 139  K 3.0*  3.4* 3.8 3.5 3.7 4.0 4.3  CL 98* 98* 95* 94* 94* 97* 97*  CO2 31 31 32 36* 34* 33* 31  GLUCOSE 117* 140* 119* 98 111* 120* 111*  BUN 68* 67* 64* 62* 61* 54* 55*  CREATININE 2.42* 2.17* 2.25* 2.38* 2.66* 2.62* 2.76*  CALCIUM 9.2 9.6 9.5 9.8 9.4 9.4 9.5  MG 2.1 1.9  --   --   --   --   --   PHOS 3.1 2.4*  --   --   --   --   --    Liver Function Tests: No results for input(s): AST, ALT, ALKPHOS, BILITOT, PROT, ALBUMIN in the last 168 hours. No results for input(s): LIPASE, AMYLASE in the last 168 hours. No results for input(s): AMMONIA in the last 168 hours. CBC:  Recent Labs Lab 07/17/16 0246 07/18/16 0216 07/19/16 0236 07/20/16 0307 07/21/16 0223 07/22/16 0849 07/23/16 0507  WBC 10.4 11.7* 11.0* 9.4 9.5 9.7 8.9  NEUTROABS 8.2* 9.4* 8.7* 7.3 7.2  --   --   HGB 10.5* 10.5* 10.2* 10.5* 10.3* 10.1* 10.2*  HCT 34.1* 33.9* 33.2* 34.3* 33.3* 33.4* 32.8*  MCV 94.2 94.7 94.3 94.0 94.3 96.0 94.3  PLT 74* 94* 142* 164 181 189 172   Cardiac Enzymes:  Recent Labs Lab 07/21/16 1119  TROPONINI 0.04*   BNP: Invalid input(s): POCBNP CBG: No results for input(s): GLUCAP in the last 168 hours.  Time coordinating discharge:  Greater than 30 minutes  Signed:  Thao Vanover, DO [facility] Pager: [phone] 07/23/2016, 11:44 AM

## 2016-07-23 NOTE — Care Management Important Message (Signed)
Important Message  Patient Details  Name: April Mueller MRN: [id number] Date of Birth: May 30, 1946   Medicare Important Message Given:  Yes    Orbie Pyo 07/23/2016, 11:31 AM

## 2016-07-23 NOTE — Clinical Social Work Placement (Signed)
   CLINICAL SOCIAL WORK PLACEMENT  NOTE  Date:  07/23/2016  Patient Details  Name: April Mueller MRN: [id number] Date of Birth: 20-Jan-1946  Clinical Social Work is seeking post-discharge placement for this patient at the [place] level of care (*CSW will initial, date and re-position this form in  chart as items are completed):  Yes   Patient/family provided with [facility] Work Department's list of facilities offering this level of care within the geographic area requested by the patient (or if unable, by the patient's family).  Yes   Patient/family informed of their freedom to choose among providers that offer the needed level of care, that participate in Medicare, Medicaid or managed care program needed by the patient, have an available bed and are willing to accept the patient.  Yes   Patient/family informed of [facility]'s ownership interest in [facility] and [facility], as well as of the fact that they are under no obligation to receive care at these facilities.  PASRR submitted to EDS on 07/18/16     PASRR number received on 07/18/16     Existing PASRR number confirmed on       FL2 transmitted to all facilities in geographic area requested by pt/family on 07/18/16     FL2 transmitted to all facilities within larger geographic area on       Patient informed that his/her managed care company has contracts with or will negotiate with certain facilities, including the following:        Yes   Patient/family informed of bed offers received.  Patient chooses bed at [facility]     Physician recommends and patient chooses bed at      Patient to be transferred to [facility] on 07/20/16.  Patient to be transferred to facility by [facility]     Patient family notified on 07/23/16 of transfer.  Name of family member notified:        PHYSICIAN       Additional Comment:     _______________________________________________ Candie Chroman, LCSW 07/23/2016, 2:56 PM

## 2016-07-23 NOTE — Clinical Social Work Note (Signed)
CSW facilitated patient discharge including contacting patient family and facility to confirm patient discharge plans. Clinical information faxed to facility and family agreeable with plan. CSW arranged ambulance transport via [facility] to [facility]. RN to call report prior to discharge ([phone]).  CSW will sign off for now as social work intervention is no longer needed. Please consult us again if new needs arise.  Tad Fancher, CSW [phone]  

## 2016-07-23 NOTE — Progress Notes (Signed)
Pt discharged to [facility].  Attempted to call report to receiving nurse at [facility] at [phone], and after initial answer, no one answered at transfer to room.  IV removed without complication.  Telemetry removed and CCMD notified.  Pt in no acute distress.

## 2016-07-23 NOTE — Progress Notes (Addendum)
Patient ID: April Mueller, female   DOB: 03-16-1946, 70 y.o.   MRN: [id number]    Advanced Heart Failure Rounding Note   Subjective:     70 y.o. 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   Admitted with ecoli urosespis. WBC 17.5. Procalcitonin 40.47.Transferred to ICU on 12/8 for respiratory failure and volume overload.   Transitioned to po diuretics 07/17/16.  Weight stable.   Creatinine fairly stable at 2.76.    Feels palpitations, has chronic afib with PVCs.  Rate controlled.   Objective:   Weight Range:  Vital Signs:   Temp:  [98.2 F (36.8 C)-98.4 F (36.9 C)] 98.4 F (36.9 C) (12/18 0432) Pulse Rate:  [63-87] 63 (12/18 0432) Resp:  [18] 18 (12/18 0432) BP: (104-120)/(50-64) 109/64 (12/18 0432) SpO2:  [95 %-97 %] 97 % (12/18 0432) Weight:  [196 lb 8 oz (89.1 kg)] 196 lb 8 oz (89.1 kg) (12/18 0432) Last BM Date: 07/22/16  Weight change: Filed Weights   07/21/16 0510 07/22/16 0445 07/23/16 0432  Weight: 195 lb 11.2 oz (88.8 kg) 196 lb 4.8 oz (89 kg) 196 lb 8 oz (89.1 kg)    Intake/Output:   Intake/Output Summary (Last 24 hours) at 07/23/16 0751 Last data filed at 07/23/16 [id number]  Gross per 24 hour  Intake              960 ml  Output              801 ml  Net              159 ml     Physical Exam: General: Well appearing today. 3L [place]  NAD  HEENT: Normal Neck: supple. JVP appears around 8 cm. Carotids 2+ bilat; no bruits. No thyromegaly or nodule noted.  Cor: PMI nondisplaced. Irregular. 3/6 MR.  Lungs: Mildly diminished basilar sounds.  Otherwise clear.  Abdomen: soft, NT, ND, no HSM. No bruits or masses. +BS  Extremities: no cyanosis, clubbing, rash. No edema.   Neuro: alert & orientedx3, cranial nerves grossly intact. moves all 4 extremities w/o difficulty. Affect flat.   Telemetry: Reviewed, Afib 70-80s with occasional PVCs  Labs: Basic  Metabolic Panel:  Recent Labs Lab 07/17/16 0246 07/18/16 0216 07/19/16 0236 07/20/16 0307 07/21/16 0223 07/22/16 0849 07/23/16 0507  NA 140 140 138 140 138 140 139  K 3.0* 3.4* 3.8 3.5 3.7 4.0 4.3  CL 98* 98* 95* 94* 94* 97* 97*  CO2 31 31 32 36* 34* 33* 31  GLUCOSE 117* 140* 119* 98 111* 120* 111*  BUN 68* 67* 64* 62* 61* 54* 55*  CREATININE 2.42* 2.17* 2.25* 2.38* 2.66* 2.62* 2.76*  CALCIUM 9.2 9.6 9.5 9.8 9.4 9.4 9.5  MG 2.1 1.9  --   --   --   --   --   PHOS 3.1 2.4*  --   --   --   --   --     Liver Function Tests: No results for input(s): AST, ALT, ALKPHOS, BILITOT, PROT, ALBUMIN in the last 168 hours. No results for input(s): LIPASE, AMYLASE in the last 168 hours. No results for input(s): AMMONIA in the last 168 hours.  CBC:  Recent Labs Lab 07/17/16 0246 07/18/16 0216 07/19/16 0236 07/20/16 0307 07/21/16 0223 07/22/16 0849 07/23/16 0507  WBC 10.4 11.7* 11.0* 9.4 9.5 9.7 8.9  NEUTROABS 8.2* 9.4* 8.7* 7.3 7.2  --   --  HGB 10.5* 10.5* 10.2* 10.5* 10.3* 10.1* 10.2*  HCT 34.1* 33.9* 33.2* 34.3* 33.3* 33.4* 32.8*  MCV 94.2 94.7 94.3 94.0 94.3 96.0 94.3  PLT 74* 94* 142* 164 181 189 172    Cardiac Enzymes:  Recent Labs Lab 07/21/16 1119  TROPONINI 0.04*    BNP: BNP (last 3 results)  Recent Labs  04/02/16 0909 04/11/16 0922 07/12/16 1447  BNP 317.2* 180.4* 318.6*    ProBNP (last 3 results) No results for input(s): PROBNP in the last 8760 hours.    Other results:  Imaging: No results found.   Medications:     Scheduled Medications: . allopurinol  300 mg Oral Daily  . atorvastatin  10 mg Oral Daily  . hydroxychloroquine  200 mg Oral BID  . metoprolol succinate  12.5 mg Oral Daily  . neomycin-polymyxin b-dexamethasone  1 application Both Eyes QHS  . omega-3 acid ethyl esters  1 g Oral Daily  . polyvinyl alcohol  1 drop Both Eyes QHS  . potassium chloride  40 mEq Oral Daily   And  . potassium chloride  20 mEq Oral Daily  .  sodium chloride flush  3 mL Intravenous Q12H  . torsemide  60 mg Oral q1800  . torsemide  80 mg Oral Daily    Infusions:   PRN Medications: acetaminophen, albuterol, loperamide, metoprolol, ondansetron **OR** ondansetron (ZOFRAN) IV   Assessment:   1. Sepsis 2/2 E-Coli Bacteremia due to UTI.  UCx and BCx both positive for E-Coli - Continue Ceftriaxone per primary.  Got Vanc/Zosyn on admission.  - CT ABD/Pelvis with scattered hepatic cysts (increased in size from prior exam), bilateral renal cysts, ?inflamatory changes around head of pancreas, Normal appendix, no acute colitis or diverticulitis.  - CCM have signed off. Now on IM service.  2. Acute on Chronic Diastolic CHF: Echo [id number] with EF 55-60%, mild MR, PASP 66 mmHg, mildly decreased RV systolic fuline but would not change at this time.  Volume status stable. Creatinine mildly up.   - Change torsemide to 60 mg bid.  3. Acute on chronic hypoxic respiratory failure: Baseline COPD and restriction from body habitus, on 3-4L home oxygen.  Suspect pulmonary edema as cause for worsening.  Much improved after diuresis.  - On baseline oxygen.   - Diuretics as above.  4. ARF on CKD stage III:  - mild uptrend, decreasing torsemide a bit.  5. MR: Suspected to be severe in past but more recently has seemed less significant despite murmur.  Mild to moderate MR by Echo 1/17, echo this admission with mild MR.  6. Atrial Fib: Chronic.  Rate back down with treatment of sepsis.  Feels palpitations, likely due to PVCs and less Toprol XL.  - Increase Toprol XL to 12.5 mg bid.  - Not AC candidate with recurrent severe GI bleeding. Was evaluated for [place] but decided against.  7. PAH: primarily [place] group 2 based on [place] 3/17.  Not on pulmonary vasodilators.  8. Hypokalemia: Supplement potassium.  9. Deconditioning: PT recommending SNF  Stable for SNF   HF meds for d/c Torsemide 60 bid Toprol XL 12.5 mg bid Atorvastatin 10 mg daily KCl 40 qam,  20 qpm  Length of Stay: [address]  07/23/2016, 7:51 AM  Advanced Heart Failure Team Pager [phone] (M-F; 7a - 4p)  Please contact [facility] for night-coverage after hours (4p -7a ) and weekends on [email or web address]

## 2016-07-24 ENCOUNTER — Other Ambulatory Visit: Payer: Self-pay

## 2016-07-24 MED ORDER — ALPRAZOLAM 0.25 MG PO TABS: 0.2500 mg | ORAL_TABLET | Freq: Two times a day (BID) | ORAL | 5 refills | Status: AC | PRN

## 2016-07-25 ENCOUNTER — Ambulatory Visit (HOSPITAL_COMMUNITY)
Admit: 2016-07-25 | Discharge: 2016-07-25 | Disposition: A | Discharge status: Home / Self Care | Payer: Medicare Other | Attending: Internal Medicine | Admitting: Internal Medicine

## 2016-07-25 ENCOUNTER — Encounter: Payer: Self-pay | Admitting: Adult Health

## 2016-07-25 ENCOUNTER — Encounter (HOSPITAL_COMMUNITY): Payer: Medicare Other

## 2016-07-25 ENCOUNTER — Non-Acute Institutional Stay (SKILLED_NURSING_FACILITY): Payer: Medicare Other | Admitting: Adult Health

## 2016-07-25 VITALS — BP 92/56 | HR 71 | Wt 201.4 lb

## 2016-07-25 DIAGNOSIS — J449 Chronic obstructive pulmonary disease, unspecified: Secondary | ICD-10-CM | POA: Diagnosis not present

## 2016-07-25 DIAGNOSIS — N183 Chronic kidney disease, stage 3 unspecified: Secondary | ICD-10-CM

## 2016-07-25 DIAGNOSIS — G4733 Obstructive sleep apnea (adult) (pediatric): Secondary | ICD-10-CM | POA: Diagnosis not present

## 2016-07-25 DIAGNOSIS — I5032 Chronic diastolic (congestive) heart failure: Secondary | ICD-10-CM | POA: Insufficient documentation

## 2016-07-25 DIAGNOSIS — I2721 Secondary pulmonary arterial hypertension: Secondary | ICD-10-CM | POA: Diagnosis not present

## 2016-07-25 DIAGNOSIS — I10 Essential (primary) hypertension: Secondary | ICD-10-CM | POA: Diagnosis not present

## 2016-07-25 DIAGNOSIS — Z9981 Dependence on supplemental oxygen: Secondary | ICD-10-CM | POA: Diagnosis not present

## 2016-07-25 DIAGNOSIS — D649 Anemia, unspecified: Secondary | ICD-10-CM | POA: Diagnosis not present

## 2016-07-25 DIAGNOSIS — I481 Persistent atrial fibrillation: Secondary | ICD-10-CM | POA: Diagnosis not present

## 2016-07-25 DIAGNOSIS — J9611 Chronic respiratory failure with hypoxia: Secondary | ICD-10-CM | POA: Insufficient documentation

## 2016-07-25 DIAGNOSIS — I482 Chronic atrial fibrillation: Secondary | ICD-10-CM | POA: Insufficient documentation

## 2016-07-25 DIAGNOSIS — I340 Nonrheumatic mitral (valve) insufficiency: Secondary | ICD-10-CM | POA: Insufficient documentation

## 2016-07-25 DIAGNOSIS — Z87891 Personal history of nicotine dependence: Secondary | ICD-10-CM | POA: Insufficient documentation

## 2016-07-25 DIAGNOSIS — A4151 Sepsis due to Escherichia coli [E. coli]: Secondary | ICD-10-CM | POA: Diagnosis not present

## 2016-07-25 DIAGNOSIS — M069 Rheumatoid arthritis, unspecified: Secondary | ICD-10-CM

## 2016-07-25 DIAGNOSIS — I4891 Unspecified atrial fibrillation: Secondary | ICD-10-CM | POA: Insufficient documentation

## 2016-07-25 DIAGNOSIS — N184 Chronic kidney disease, stage 4 (severe): Secondary | ICD-10-CM | POA: Insufficient documentation

## 2016-07-25 DIAGNOSIS — I272 Pulmonary hypertension, unspecified: Secondary | ICD-10-CM | POA: Diagnosis not present

## 2016-07-25 DIAGNOSIS — E669 Obesity, unspecified: Secondary | ICD-10-CM | POA: Insufficient documentation

## 2016-07-25 DIAGNOSIS — Z9989 Dependence on other enabling machines and devices: Secondary | ICD-10-CM

## 2016-07-25 DIAGNOSIS — I130 Hypertensive heart and chronic kidney disease with heart failure and stage 1 through stage 4 chronic kidney disease, or unspecified chronic kidney disease: Secondary | ICD-10-CM | POA: Insufficient documentation

## 2016-07-25 DIAGNOSIS — I4819 Other persistent atrial fibrillation: Secondary | ICD-10-CM

## 2016-07-25 NOTE — Patient Instructions (Signed)
Your physician recommends that you schedule a follow-up appointment in: 2 weeks with Amy Clegg,NP

## 2016-07-25 NOTE — Progress Notes (Signed)
Patient ID: April Mueller, female   DOB: Jun 09, 1946, 70 y.o.   MRN: [id number]    Location:   [facility] Room Number: 116-A Place of Service:  SNF (31)   CODE STATUS: Full Code  No Known Allergies  Chief Complaint  Patient presents with  . Hospitalization Follow-up    Hospital Follow up    HPI:  She has been hospitalized for acute on chronic renal failure; sepsis secondary to e-coli uti and bacteremia.  She is here for short term rehab with her goal to return back home. She is complaining of a painful rash on her right buttock suspicious for shingles.    Past Medical History:  Diagnosis Date  . Anemia 03/23/15   transfusion  . Anxiety   . Aortic aneurysm ([place])   . Arthritis    "qwhere" (02/20/2016)  . CHF (congestive heart failure) ([place])   . Chronic lower back pain   . CKD (chronic kidney disease) stage 3, GFR 30-59 ml/min    "on dialysis for 3 months after my aneurysm in 2005" (02/20/2016)  . COPD (chronic obstructive pulmonary disease) ([place])   . Gout   . Heart murmur   . History of blood transfusion "several"  . Hypertension   . Lupus   . On home oxygen therapy    "3L; 24/7" (02/20/2016)  . OSA on CPAP   . Pulmonary HTN   . Rheumatoid arthritis(714.0) 11/06/2012  . Tuberculosis    dormant carrier    Past Surgical History:  Procedure Laterality Date  . ABDOMINAL AORTIC ANEURYSM REPAIR  2005  . BREAST LUMPECTOMY Right   . CARDIAC CATHETERIZATION N/A 10/24/2015   Procedure: Right Heart Cath;  Surgeon: Larey Dresser, MD;  Location: [facility] CV [facility];  Service: Cardiovascular;  Laterality: N/A;  . CATARACT EXTRACTION W/ INTRAOCULAR LENS  IMPLANT, BILATERAL Bilateral 2016  . COLONOSCOPY N/A 08/19/2015   Procedure: COLONOSCOPY;  Surgeon: Jerene Bears, MD;  Location: [facility] ENDOSCOPY;  Service: Endoscopy;  Laterality: N/A;  . ENTEROSCOPY N/A 02/22/2016   Procedure: ENTEROSCOPY;  Surgeon: Milus Banister, MD;  Location: [facility];  Service: Endoscopy;   Laterality: N/A;  . ESOPHAGOGASTRODUODENOSCOPY N/A 08/19/2015   Procedure: ESOPHAGOGASTRODUODENOSCOPY (EGD);  Surgeon: Jerene Bears, MD;  Location: [facility] ENDOSCOPY;  Service: Endoscopy;  Laterality: N/A;  patient scheduled, anesthesia aware of 1500 case, per [place].  08/18/15 DP  . GIVENS CAPSULE STUDY N/A 08/21/2015   Procedure: GIVENS CAPSULE STUDY;  Surgeon: Gatha Mayer, MD;  Location: [facility];  Service: Endoscopy;  Laterality: N/A;  . HEMORRHOID SURGERY    . RIGHT HEART CATHETERIZATION N/A 08/18/2014   Procedure: RIGHT HEART CATH;  Surgeon: Jolaine Artist, MD;  Location: [facility] C[facility] LAB;  Service: Cardiovascular;  Laterality: N/A;  . TEE WITHOUT CARDIOVERSION N/A 01/05/2015   Procedure: TRANSESOPHAGEAL ECHOCARDIOGRAM (TEE);  Surgeon: Larey Dresser, MD;  Location: [facility];  Service: Cardiovascular;  Laterality: N/A;  . WISDOM TOOTH EXTRACTION      Social History   Social History  . Marital status: Legally Separated    Spouse name: N/A  . Number of children: 1  . Years of education: N/A   Occupational History  . Disability     Office Work   Social History Main Topics  . Smoking status: Former Smoker    Packs/day: 1.50    Years: 14.00    Types: Cigarettes    Quit date: 08/09/2003  . Smokeless tobacco: Never Used  . Alcohol  use 0.0 oz/week     Comment: 02/20/2016 "drank a little in my teens"  . Drug use: No  . Sexual activity: No   Other Topics Concern  . Not on file   Social History Narrative  . No narrative on file   Family History  Problem Relation Age of Onset  . Heart attack Mother   . Prostate cancer Father   . Diabetes Brother   . Kidney failure Brother    Vitals:   07/25/16 2203  BP: (!) 91/58  Pulse: 98  Resp: 16  SpO2: 95%     Patient's Medications  New Prescriptions   No medications on file  Previous Medications   ACETAMINOPHEN (TYLENOL) 325 MG TABLET    Take 2 tablets (650 mg total) by mouth every 6 (six) hours as needed for mild pain (or  Fever >/= 101).   ALBUTEROL (PROVENTIL) (2.5 MG/3ML) 0.083% NEBULIZER SOLUTION    Take 3 mLs (2.5 mg total) by nebulization every 4 (four) hours as needed for wheezing or shortness of breath.   ALLOPURINOL (ZYLOPRIM) 300 MG TABLET    Take 300 mg by mouth daily.   ALPRAZOLAM (XANAX) 0.25 MG TABLET    Take 1 tablet (0.25 mg total) by mouth every 12 (twelve) hours as needed for anxiety or sleep.   ATORVASTATIN (LIPITOR) 10 MG TABLET    TAKE 1 TABLET DAILY   CARBOXYMETHYLCELLULOSE (REFRESH PLUS) 0.5 % SOLN    Place 1 drop into both eyes at bedtime.   CIPROFLOXACIN (CIPRO) 500 MG TABLET    Take 1 tablet (500 mg total) by mouth daily with breakfast. X 4 days   HYDROXYCHLOROQUINE (PLAQUENIL) 200 MG TABLET    Take 200 mg by mouth 2 (two) times daily.   METOPROLOL SUCCINATE (TOPROL-XL) 25 MG 24 HR TABLET    Take 0.5 tablets (12.5 mg total) by mouth 2 (two) times daily. At 1800   NEOMYCIN-POLYMYXIN B-DEXAMETHASONE (MAXITROL) 3.5-10000-0.1 OINT    Place 1 application into both eyes at bedtime.   OMEGA-3 FATTY ACIDS (FISH OIL) 1200 MG CAPS    Take 1,200 mg by mouth daily.   OXYGEN    Inhale 3-6 L into the lungs continuous.    POTASSIUM CHLORIDE (K-DUR) 10 MEQ TABLET    Take 4 tabs in AM and 2 tabs in PM   TORSEMIDE (DEMADEX) 20 MG TABLET    Take 3 tablets (60 mg total) by mouth 2 (two) times daily.  Modified Medications   No medications on file  Discontinued Medications   No medications on file     SIGNIFICANT DIAGNOSTIC EXAMS  07-13-16: 2-d echo: - Left ventricle: The cavity size was normal. There was mild concentric hypertrophy. Systolic function was normal. The estimated ejection fraction was in the range of 55% to 60%. Wall motion was normal; there were no regional wall motion abnormalities. Features are consistent with a pseudonormal left ventricular filling pattern, with concomitant abnormal relaxation and increased filling pressure (grade 2 diastolic dysfunction).   Doppler parameters are  consistent with high ventricular filling pressure. - Descending aorta: The descending aorta was mildly dilated. - Mitral valve: There was mild regurgitation. - Left atrium: The atrium was severely dilated. - Right ventricle: Systolic function was mildly reduced. - Atrial septum: There was a medium-sized atrial septal aneurysm, with free respirophasic mobility between right and left atrial cavities. - Tricuspid valve: There was mild-moderate regurgitation. - Pulmonary arteries: PA peak pressure: 66 mm Hg (S). Impressions: - The right ventricular systolic  pressure was increased consistent with moderate pulmonary hypertension.  07-13-16: renal ultrasound: Findings consistent with chronic medical renal disease. No hydronephrosis. Bilateral renal cysts.  07-13-16: chest x-ray; No acute abnormalities. Chronic cardiomegaly. 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   07-13-16; ct of abdomen and pelvis: 1. Cardiomegaly is noted. Again noted aneurysmal dilatation of descending aorta stable from prior exam. Small right pleural effusion with right base posterior medially atelectasis. 2. Scattered hepatic cysts are noted progression in size from prior exam. 3. Bilateral renal cysts.  Left nonobstructive nephrolithiasis. 4. Micro nodular liver contour. Chronic liver disease or cirrhosis cannot be excluded. 5. There is subtle mild stranding of the fat surrounding the pancreatic head region in medial and inferior to duodenum. Mild focal pancreatitis or duodenal inflammation cannot be excluded. Clinical correlation is necessary. Study is limited without IV contrast. 6. No small bowel or colonic obstruction. No pericecal inflammation. Normal appendix. 7. Distal colonic diverticula. No evidence of acute colitis or diverticulitis. 8. There is a dominant follicle/cyst within right ovary measures 2 cm.   07-14-16: chest x-ray: Patchy perihilar opacification which may be due to mild interstitial  edema versus infection. Stable cardiomegaly  LABS REVIEWED:   07-12-16: wbc 17.5; hgb 11.4; hct 37.4; mcv 96.9; plt 68; glucose 116; bun 46; creat 2.94; k+ 4.5; na++ 137; liver normal albumin 3.1; urine culture: e-coli; blood culture: e-coli.  07-14-16: wbc 12.8; hgb 10.4; hct 34.7; mcv 97.2; plt 59; glucose 127; bun 53; creat 2.76; k+ 4.5; na++ 136; liver normal albumin 2.6 07-18-16: wbc 11.7; hgb 10.5; hct 33.9; mcv 94.7; plt 94; glucose 140; bun 67; creat 2.17; k+ 3.4; na++ 140; phos 2.4; mag 1.9 07-23-16: wbc 8.9; hgb 10.2 hct 32.8; mcv 94.3 plt 172; glucose 111; bun 55; creat 2.76; k+ 4.3; na++ 139    Review of Systems  Constitutional: Negative for malaise/fatigue.  Respiratory: Negative for cough and shortness of breath.   Cardiovascular: Negative for chest pain, palpitations and leg swelling.  Gastrointestinal: Negative for abdominal pain, constipation and heartburn.  Musculoskeletal: Negative for back pain, joint pain and myalgias.  Skin: Negative.        Has painful rash on right buttock   Neurological: Negative for dizziness.  Psychiatric/Behavioral: The patient is not nervous/anxious.     Physical Exam  Constitutional: She is oriented to person, place, and time. No distress.  Eyes: Conjunctivae are normal.  Neck: Neck supple. No JVD present. No thyromegaly present.  Cardiovascular: Normal rate, regular rhythm and intact distal pulses.   Respiratory: Effort normal and breath sounds normal. No respiratory distress. She has no wheezes.  Is 02 dependent   GI: Soft. Bowel sounds are normal. She exhibits no distension. There is no tenderness.  Musculoskeletal: She exhibits no edema.  Able to move all extremities   Lymphadenopathy:    She has no cervical adenopathy.  Neurological: She is alert and oriented to person, place, and time.  Skin: Skin is warm and dry. She is not diaphoretic.  Has shingles rash on right buttock   Psychiatric: She has a normal mood and affect.      ASSESSMENT/ PLAN:  1. Diastolic heart failure: EF 55-60% (07-13-16) will continue demadex 60 mg twice daily with k+ 40 meq in the AM nad 20 meq in the PM will continue toprol xl 12.5 mg daily   2. Hypertension: has pulmonary hypertension: will continue toprol xl 12. 5 mg daily   3. COPD with  Chronic respiratory failure: is on chronic  02 at 3-4L/[place] has albuterol neb treatment every 4 hours as needed  4. Dyslipidemia: will continue lipitor 10 mg daily   5. Gout: no recent flair will continue allopurinol 300 mg daily   6. RA: is without change will continue plaquenil 200 mg twice daily   7. Anxiety: will continue xanax 0.25 mg twice daily as needed  8. UTI sepsis: will complete 4  More days of cipro and will monitor   9. Shingles rash will begin valtrex 1 gm twice daily for 7 days  10. CKD stage III: bun 55 creat 2.76  11. Afib: heart rate stable will continue toprol xl 12.5 mg daily for rate control    Will place her on daily weights  Time spent with patient  50   minutes >50% time spent counseling; reviewing medical record; tests; labs; and developing future plan of care   MD is aware of resident's narcotic use and is in agreement with current plan of care. We will attempt to wean resident as apropriate   Ok Edwards NP [facility]  Contact [phone] Monday through Friday 8am- 5pm  After hours call [phone]

## 2016-07-25 NOTE — Progress Notes (Signed)
Patient ID: April Mueller, female   DOB: 1946-04-19, 70 y.o.   MRN: [id number]   HPI  PCP: Dr Harrington Challenger Pulmonary: Dr Byrum/Dr Melvyn Novas  Cardiology: Dr. Aundra Dubin  April Mueller is a 70 yo with COPD on home oxygen, rheumatoid arthritis, chronic atrial fibrillation, chronic diastolic CHF, CKD.    Moved back from [place] and saw Dr. Lamonte Sakai. In [place] was followed by cardiology and pulmonary. Started on Tracleer in 2006. Revatio was added, however was discontinued without any change in her symptoms. Stopped smoking in January 2005 when she had aortic aneurysm repair. Smoked 1.5-2 ppd x 15 - 20 years.  Later on macitentan.  I had her do a TEE in 6/16.  She had a loud MR murmur.  TEE showed very eccentric, posteriorly-directed MR that may be from subtle prolapse of the anterior mitral leaflet.  I am concerned that the MR could be severe but very difficult to fully visualize.  She additionally had PFTs done that were abnormal, but according to her pulmonologist Melvyn Novas), the obstruction was really only minimal and the restriction was primarily related to body habitus.  He does not think that we can explain her hypoxemia and dyspnea primarily from parenchymal lung pathology.  I sent her to [facility] for evaluation for percutaneous mitral valve clip.  They did not think that she was a good candidate.   She has been anemic and received 1 unit PRBCs in 8/16.  She saw GI and was noted to be heme negative, no scopes were done (heme negative and high risk). She has had iron infusions.   Opsumit was stopped in 9/16 due to suspicion for group 2 pulmonary hypertension only (low PVR) and she felt better off it.   She was admitted in 1/17 with increased dyspnea and was found to be profoundly anemic.  She had a total of 5 units PRBCs and an iron infusion in the hospital.  Stool was FOBT+, melena.  EGD, colonoscopy, and capsule endoscopy were all done without clear source of blood loss.  While in the hospital, she was noted to go into atrial  fibrillation with RVR.  She initially required amiodarone for rate control, but was transitioned over to Toprol XL. She was not anticoagulated due to concern for GI blood loss and anemia.  Anticoagulation was attempted later.   RHC in 3/17 showed elevated right heart filling pressures and mildly elevated PCWP.  There was mild pulmonary hypertension, likely pulmonary venous hypertension with PVR 1.9 WU.   She was admitted in 7/17 with symptomatic anemia, received 3 units PRBCs.  Anticoagulation was stopped.  Small bowel enteroscopy did not show a source of bleeding.    She was seen by Dr Rayann Heman, he recommended against Watchman. She is now off anticoagulation.  Getting Fe infusions.  Admitted 12/7 through 07/23/16. Treated for UTI and volume overload. Discharged to [facility] ongoing rehab. Discharge weight  196 pounds.   She returns for post hospital follow up. Overall feeling She  Says she is at her baseline. Able to participate with therapy.   No BRBPR or melena.  No chest pain.  Stable orthopnea.  Using CPAP at night and oxygen during the day. She is currently at [facility]. Plans to return home when she is stronger.   Labs 02/05/13 K 3.8 Creatinine 1.37 05/08/13 K 3.8, Creatinine 1.58 11/14 K 4.1, creatinine 1.82 08/09/2014: K 4.0 Creatinine 1.45  5/16: K 3.7, creatinine 1.76, BNP 447 6/16: K 5 => 4.6, creatinine 2.09 => 2.13,  BNP 291 8/16: K 4.7, creatinine 2.1, HCT 30.7 9/16: K 4.5, creatinine 2.3, HCT 34.1 1/17: K 4.8, creatinine 1.84 => 1.87, HCT 29.7, plts 136, FOBT+, BNP 403 2/17: K 3.5, creatinine 1.89, BNP 329, hgb 10 3/17: K 3.8, creatinine 2.5 => 2.3, HCT 35.4 => 33.5 5/17: K 3.7, creatinine 2.0, hgb 10.6 7/17: K 3.7, creatinine 2, hgb 9.9 8/17: K 3.8, creatinine 1.9, HCT 31.3 9/17: K 3.9, creatinine 2  PMH:  1) COPD       --former smoker (28 pk-yrs)       --on home O2 - 4L/min rest, up to 5L/min exertion.        --spirometry 6/13 in [place] = 0.85L FVC = 1.3L no DLCO  measured. FEV1 in 2014 = 0.73        --CT chest 10/14 - moderate centrilobular emphysema. No pulm fibrosis but there was evidence for post-infectious/inflammatory scarring. Stable Asc Ao repair       --PFTs (11/14) FVC 46%, FEV1 0.8L (40%), ratio 87%, FEF 25-75% 0.44L DLCO 22%, TLC 50% => PFTs in 06/2013 did NOT suggest significant airflow obstruction according to Dr Gustavus Bryant last note despite "moderate emphysema" on CT.        --PFTs (6/16) were abnormal with severe obstruction, severe restriction, severely decreased DLCO.  Reviewed with Dr Melvyn Novas => he thinks obstruction is actually fairly minimal and that the restriction is due to body habitus.   2) Obesity 3) RA       --on plaquenil, sees Dr Lenna Gilford.  4) Type A aortic dissection s/p repair 2005     --c/b renal failure requiring HD x 3 months 5) PAH - previously treated in [place].  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      --on macitentan (revatio stopped 10/14).     --V/Q scan 11/14 no chronic PE     -- 2015 Tracleer stopped and started on macitentan. 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      -- RHC 08/18/2014: No role for pulmonary vasodilators.  RA = 14 RV = 62/3/15 PA = 63/21 (38) PCW = mean 23 v waves to 40 Fick cardiac output/index = 6.3/3.2 Thermodilution CO/CI = 6.1/3.1, PVR = 2.0 WU, FA sat = 91% PA sat = 57%, 62%     -- RHC 3/17: mean RA 13, PA 44/2 mean 29, mean PCWP 17, CI 3.04, PVR 1.9 WU => pulmonary venous hypertension. 6) OSA 7) Diastolic CHF    --ECHO [id number] EF 55-60% RV mildly dilated with normal function. Mild to moderate posterior MR. RVSP 56. Probable restrictive diastolic filling pattern    --ECHO 9/15 EF 60-65% RV mildly dilated with normal function. Mild to moderate posterior MR. RVSP 40. Ao Root ok    --TEE 6/16 with EF 55-60%, D-shaped interventricular septum suggestive of RV pressure/volume overload, RV mildly dilated with normal systolic function, peak RV-RA gradient 57 mmHg, very eccentric  posteriorly-directed mitral regurgitation, possibly severe, etiology may be a very small area of prolapse on the anterior leaflet, s/p ascending aorta repair with residual dissection flap in the descending thoracic aorta.       --Echo (1/17) with EF 55-60%, mild to moderate MR, PASP 66 8) CKD 9) Mitral regurgitation: Possibly severe by 6/16 TEE, very eccentric.  Not good candidate for percutaneous MV clip (seen at [facility]).  Echo (1/17) with only mild to moderate MR.  10) Anemia: Suspect from chronic disease/renal disease and well as chronic GI bleeding.  EGD/colonoscopy/capsule endoscopy in  1/17 did not show any definite site for blood loss.  Small bowel enteroscopy in 7/17 did not show etiology of bleeding. 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  Getting Fe infusions.  11) Atrial fibrillation: Persistent, 1st noted in 1/17.  12) ECHO - EF 55-60% Grade II DD. Peak PA pressure 66 mmhg. Mild MR. RV mildly reduced.   ROS: All systems reviewed and negative except as per HPI.  Current Outpatient Prescriptions on File Prior to Encounter  Medication Sig Dispense Refill  . acetaminophen (TYLENOL) 325 MG tablet Take 2 tablets (650 mg total) by mouth every 6 (six) hours as needed for mild pain (or Fever >/= 101).    [name] albuterol (PROVENTIL) (2.5 MG/3ML) 0.083% nebulizer solution Take 3 mLs (2.5 mg total) by nebulization every 4 (four) hours as needed for wheezing or shortness of breath. 75 mL 12  . allopurinol (ZYLOPRIM) 300 MG tablet Take 300 mg by mouth daily.    [name] ALPRAZolam (XANAX) 0.25 MG tablet Take 1 tablet (0.25 mg total) by mouth every 12 (twelve) hours as needed for anxiety or sleep. 60 tablet 5  . atorvastatin (LIPITOR) 10 MG tablet TAKE 1 TABLET DAILY 90 tablet 2  . carboxymethylcellulose (REFRESH PLUS) 0.5 % SOLN Place 1 drop into both eyes at bedtime.    . ciprofloxacin (CIPRO) 500 MG tablet Take 1 tablet (500 mg total) by mouth daily with breakfast. X 4 days 4 tablet 0  . hydroxychloroquine  (PLAQUENIL) 200 MG tablet Take 200 mg by mouth 2 (two) times daily.    . metoprolol succinate (TOPROL-XL) 25 MG 24 hr tablet Take 0.5 tablets (12.5 mg total) by mouth 2 (two) times daily. At 1800 (Patient taking differently: Take 12.5 mg by mouth daily. At 1800) 60 tablet 0  . neomycin-polymyxin b-dexamethasone (MAXITROL) 3.5-10000-0.1 OINT Place 1 application into both eyes at bedtime.    . Omega-3 Fatty Acids (FISH OIL) 1200 MG CAPS Take 1,200 mg by mouth daily.    . OXYGEN Inhale 3-6 L into the lungs continuous.     . potassium chloride (K-DUR) 10 MEQ tablet Take 4 tabs in AM and 2 tabs in PM (Patient taking differently: Take 10 mEq by mouth See admin instructions. Take 4 tabs in AM and 2 tabs in PM) 360 tablet 3  . torsemide (DEMADEX) 20 MG tablet Take 3 tablets (60 mg total) by mouth 2 (two) times daily. (Patient taking differently: Give 3 tablets in the morning and 2 tablets at bedtime) 180 tablet 0   No current facility-administered medications on file prior to encounter.    Social History   Social History  . Marital status: Legally Separated    Spouse name: N/A  . Number of children: 1  . Years of education: N/A   Occupational History  . Disability     Office Work   Social History Main Topics  . Smoking status: Former Smoker    Packs/day: 1.50    Years: 14.00    Types: Cigarettes    Quit date: 08/09/2003  . Smokeless tobacco: Never Used  . Alcohol use 0.0 oz/week     Comment: 02/20/2016 "drank a little in my teens"  . Drug use: No  . Sexual activity: No   Other Topics Concern  . Not on file   Social History Narrative  . No narrative on file   Family History  Problem Relation Age of Onset  . Heart attack Mother   . Prostate cancer Father   . Diabetes Brother   .  Kidney failure Brother     Vitals:   07/25/16 1504  BP: (!) 92/56  BP Location: Left Arm  Patient Position: Sitting  Cuff Size: Large  Pulse: 71  SpO2: 97%  Weight: 201 lb 6.4 oz (91.4 kg)    Physical Exam Gen: Pleasant, well-nourished, in no distress, normal affect; on O2 by [place].  Arrived in a wheel chair.  HEENT: normal Neck: JVP 7-8 cm  Lungs: CTAB in 4 liters oxygen.  Cardiovascular: PMI normal. Irregular S1S2, no S3/S4, 2/6 HSM LLSB/apex. 1+ ankle edema bilaterally. Extremities: No deformities, no cyanosis or clubbing.  Neuro: alert, non focal. Cranial nerves intact. Moves all 4 extremities without difficulty Skin: Warm, no lesions or rashes  Assessment/Plan: 1. 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  This appeared to be primarily pulmonary venous hypertension.  Interestingly, there were very prominent V waves in the PCWP tracing. On exam, she has a prominent MR murmur.  I was concerned that significant MR may be playing a major role in her symptomatology.TEE in 6/16 showing very eccentric, posteriorly-directed mitral regurgitation possibly from subtle anterior leaflet prolapse.  MR could be severe but was difficult to fully visualize.  She has significant COPD to help explain her hypoxemia and dyspnea. She was seen at [facility] for consideration of percutaneous MV clip and was not thought to be a good candidate => concern that MV disease may not actually be severe and concern that her dyspnea is coming from COPD and would not be improved by MV clipping.  Most recent echo in 1/17 showed normal LV EF and only visualized mild to moderate MR.  Last RHC in 3/17 showed mildly elevated filling pressures and pulmonary venous hypertension (mild).   -Volume status stable. Continue torsemide to 60/60 mg twice a day.   Continue potassium 40 qam/20 qpm.   2. Chronic respiratory failure:  She is on 4-5 L home oxygen by [place]. She has COPD and restriction from her body habitus.  3. OSA: Continue CPAP nightly.  4. CKD: Stage III-IV.  Repeat BMET next visit.  5. COPD: See  #2 above, per pulmonary.     6. Aortic dissection s/p repair 2005: Although complete records are not available, it appears that the patient developed a Type A dissection in 2005 with ascending aorta repair (can see the surgically repaired ascending aorta well on TEE).  She has residual dissection in the descending thoracic aorta.  7. Pulmonary hypertension: This is primarily Mutual group 2 (pulmonary venous hypertension) based on RHC in 3/17.  She is not a good candidate for pulmonary vasodilators.   8. Mitral regurgitation:  TEE concerning for possibly severe, eccentric mitral regurgitation, most likely due to subtle prolapse of the anterior mitral valve leaflet.  I have been concerned that the MR is playing a role in her CHF. She had prominent V-waves on RHC in 1/16, and she has a loud mitral area murmur.  I do not think that she is a good candidate for open MV surgery. I had her evaluated at [facility] for possible percutaneous mitral clipping.  She was turned down for this as her MR was not clearly severe and fixing it may not help her hypoxemia appreciably.  Of note, most recent echo in 1/17 actually visualized only mild to moderate MR.  Mild MR on ECHO.  9. Anemia: Suspect anemia of chronic disease/renal disease but also with component of GI  blood loss.  She has had GI bleeding with FOBT+, but unable to visualized bleeding source on EGD, colonoscopy, enteroscopy, or capsule endoscopy (most recently in 7/17).  Seeing hematology, getting Aranesp and Fe infusions. Some concern now for possible myelodysplastic syndrome.  10. Atrial fibrillation: Persistent.  Rate is controlled.  CHADSVASC = 3.  She was evaluated for Watchman but decided not to be a good candidate. Due to recurrent severe GI bleeding, anticoagulation was stopped altogether. No stroke-like symptoms.  11. Deconditioning- continue PT at SNF.   Follow up in 2-3 weeks with Dr Aundra Dubin.   Amy Clegg NP_C  07/25/2016

## 2016-07-30 LAB — CBC AND DIFFERENTIAL
HEMATOCRIT: 33 % — AB (ref 36–46)
HEMOGLOBIN: 10.1 g/dL — AB (ref 12.0–16.0)
Platelets: 122 10*3/uL — AB (ref 150–399)
WBC: 6.3 10^3/mL

## 2016-08-02 LAB — BASIC METABOLIC PANEL
BUN: 38 mg/dL — AB (ref 4–21)
Creatinine: 2 mg/dL — AB (ref <=1.1)
GLUCOSE: 83 mg/dL
POTASSIUM: 4.7 mmol/L (ref 3.4–5.3)
SODIUM: 143 mmol/L (ref 137–147)

## 2016-08-03 ENCOUNTER — Other Ambulatory Visit: Payer: Self-pay | Admitting: *Deleted

## 2016-08-07 ENCOUNTER — Non-Acute Institutional Stay (SKILLED_NURSING_FACILITY): Payer: Medicare Other | Admitting: Internal Medicine

## 2016-08-07 ENCOUNTER — Encounter: Payer: Self-pay | Admitting: Internal Medicine

## 2016-08-07 DIAGNOSIS — R234 Changes in skin texture: Secondary | ICD-10-CM | POA: Diagnosis not present

## 2016-08-07 DIAGNOSIS — I129 Hypertensive chronic kidney disease with stage 1 through stage 4 chronic kidney disease, or unspecified chronic kidney disease: Secondary | ICD-10-CM | POA: Diagnosis not present

## 2016-08-07 DIAGNOSIS — I5032 Chronic diastolic (congestive) heart failure: Secondary | ICD-10-CM | POA: Diagnosis not present

## 2016-08-07 DIAGNOSIS — I480 Paroxysmal atrial fibrillation: Secondary | ICD-10-CM | POA: Diagnosis not present

## 2016-08-07 DIAGNOSIS — J449 Chronic obstructive pulmonary disease, unspecified: Secondary | ICD-10-CM | POA: Diagnosis not present

## 2016-08-07 DIAGNOSIS — I10 Essential (primary) hypertension: Secondary | ICD-10-CM | POA: Diagnosis not present

## 2016-08-07 DIAGNOSIS — M069 Rheumatoid arthritis, unspecified: Secondary | ICD-10-CM

## 2016-08-07 DIAGNOSIS — D638 Anemia in other chronic diseases classified elsewhere: Secondary | ICD-10-CM

## 2016-08-07 DIAGNOSIS — J9611 Chronic respiratory failure with hypoxia: Secondary | ICD-10-CM

## 2016-08-07 DIAGNOSIS — D469 Myelodysplastic syndrome, unspecified: Secondary | ICD-10-CM

## 2016-08-07 DIAGNOSIS — N184 Chronic kidney disease, stage 4 (severe): Secondary | ICD-10-CM | POA: Diagnosis not present

## 2016-08-07 NOTE — Progress Notes (Signed)
Patient ID: April Mueller, female   DOB: 01-Dec-1945, 71 y.o.   MRN: [id number]    HISTORY AND PHYSICAL   DATE: 08/07/2016  Location:    [facility] Room Number: 116 A Place of Service: SNF (31)   Extended Emergency Contact Information Primary Emergency Contact: Hobbs,Jayson  [place] Mobile Phone: [phone] Relation: Son Secondary Emergency Contact: Scott,Bonnie  [place] Mobile Phone: [phone] Relation: Neighbor  Advanced Directive information Does Patient Have a Medical Advance Directive?: Yes, Type of Advance Directive: Out of facility DNR (pink MOST or yellow form), Pre-existing out of facility DNR order (yellow form or pink MOST form): Pink MOST form placed in chart (order not valid for inpatient use)  Chief Complaint  Patient presents with  . New Admit To SNF    HPI:  71 yo female seen today as a new admission into SNF following hospital stay for sepsis 2/2 E coli UTI, bacteremia, A/CKD stage 3, A/C dHF, A/chronic respiratory failure with hypoxia, elevated troponin level, OSA/CPAP, O2 dependent 3-4L/min, hyperlipidemia, hypokalemia, PAF, AOCD, MDS. Infection initially tx with IV abx --> po keflex and cipro. Blood cx and urine cx (+) E coli. 2D echo revealed EF 55%; mild MR; mildly reduced RV function. WBC 9.4K; H/H 10.5/34.3; BUN 55 with Cr 2.76; albumin 2.6 at d/c. She presents to SNF for short term rehab.  Today she reports c/a bleeding right buttock. She also has epistaxis and nasal congestion. She was tx for shingles on 12/20th and completed 7 days of valtrex. She completed all of cipro for sepsis. No f/c. No nursing issues. No falls. Appetite ok. Sleeps well.  Hypertension/ pulmonary hypertension - stable on toprol xl 12. 5 mg daily   COPD/Chronic respiratory failure with hypoxia - stable on [place] O2 @ 3-4L/; albuterol neb treatment every 4 hours as needed  Dyslipidemia - stable on lipitor 10 mg daily. LDL 43 in Aug  2017  Gout - stable on allopurinol 300 mg daily. No recent flares  RA - stable on plaquenil 200 mg twice daily. 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 Cr  2.00  PAF - rate controlled on toprol xl 12.5 mg daily; followed by cardio  Chronic Diastolic heart failure  -  EF 55-60% (07-13-16); takes demadex 60 mg twice daily with k+ 40 meq in the AM nad 20 meq in the PM; toprol xl 12.5 mg daily; ASA daily; followed by HF clinic  Anemia of chronic disease/MDS - Hgb stable at 10.1. Followed by H/O  Past Medical History:  Diagnosis Date  . Anemia 03/23/15   transfusion  . Anxiety   . Aortic aneurysm ([place])   . Arthritis    "qwhere" (02/20/2016)  . CHF (congestive heart failure) ([place])   . Chronic lower back pain   . CKD (chronic kidney disease) stage 3, GFR 30-59 ml/min    "on dialysis for 3 months after my aneurysm in 2005" (02/20/2016)  . COPD (chronic obstructive pulmonary disease) ([place])   . Gout   . Heart murmur   . History of blood transfusion "several"  . Hypertension   . Lupus   . On home oxygen therapy    "3L; 24/7" (02/20/2016)  . OSA on CPAP   . Pulmonary HTN   . Rheumatoid arthritis(714.0) 11/06/2012  . Tuberculosis    dormant carrier    Past Surgical History:  Procedure Laterality Date  . ABDOMINAL AORTIC ANEURYSM REPAIR  2005  . BREAST LUMPECTOMY Right   . CARDIAC CATHETERIZATION N/A 10/24/2015   Procedure: Right Heart Cath;  Surgeon: Larey Dresser, MD;  Location: [facility];  Service: Cardiovascular;  Laterality: N/A;  . CATARACT EXTRACTION W/ INTRAOCULAR LENS  IMPLANT, BILATERAL Bilateral 2016  . COLONOSCOPY N/A 08/19/2015   Procedure: COLONOSCOPY;  Surgeon: Jerene Bears, MD;  Location: [facility] ENDOSCOPY;  Service: Endoscopy;  Laterality: N/A;  . ENTEROSCOPY N/A 02/22/2016   Procedure: ENTEROSCOPY;  Surgeon: Milus Banister, MD;  Location: [facility];  Service: Endoscopy;  Laterality: N/A;  .  ESOPHAGOGASTRODUODENOSCOPY N/A 08/19/2015   Procedure: ESOPHAGOGASTRODUODENOSCOPY (EGD);  Surgeon: Jerene Bears, MD;  Location: [facility] ENDOSCOPY;  Service: Endoscopy;  Laterality: N/A;  patient scheduled, anesthesia aware of 1500 case, per [place].  08/18/15 DP  . GIVENS CAPSULE STUDY N/A 08/21/2015   Procedure: GIVENS CAPSULE STUDY;  Surgeon: Gatha Mayer, MD;  Location: [facility];  Service: Endoscopy;  Laterality: N/A;  . HEMORRHOID SURGERY    . RIGHT HEART CATHETERIZATION N/A 08/18/2014   Procedure: RIGHT HEART CATH;  Surgeon: Jolaine Artist, MD;  Location: [facility] CATH LAB;  Service: Cardiovascular;  Laterality: N/A;  . TEE WITHOUT CARDIOVERSION N/A 01/05/2015   Procedure: TRANSESOPHAGEAL ECHOCARDIOGRAM (TEE);  Surgeon: Larey Dresser, MD;  Location: [facility];  Service: Cardiovascular;  Laterality: N/A;  . [place] EXTRACTION      Patient Care Team: Lawerance Cruel, MD as PCP - General (Family Medicine)  Social History   Social History  . Marital status: Legally Separated    Spouse name: N/A  . Number of children: 1  . Years of education: N/A   Occupational History  . Disability     Office Work   Social History Main Topics  . Smoking status: Former Smoker    Packs/day: 1.50    Years: 14.00    Types: Cigarettes    Quit date: 08/09/2003  . Smokeless tobacco: Never Used  . Alcohol use 0.0 oz/week     Comment: 02/20/2016 "drank a little in my teens"  . Drug use: No  . Sexual activity: No   Other Topics Concern  . Not on file   Social History Narrative  . No narrative on file     reports that she quit smoking about 13 years ago. Her smoking use included Cigarettes. She has a 21.00 pack-year smoking history. She has never used smokeless tobacco. She reports that she drinks alcohol. She reports that she does not use drugs.  Family History  Problem Relation Age of Onset  . Heart attack Mother   . Prostate cancer Father   . Diabetes Brother   . Kidney failure Brother     Family Status  Relation Status  . Mother   . Father   . Brother   . Brother     Immunization History  Administered Date(s) Administered  . Influenza Split 05/06/2012  . Influenza,inj,Quad PF,36+ Mos 06/09/2013, 05/18/2015  . Influenza-Unspecified 05/18/2014  . Pneumococcal Conjugate-13 09/16/2015  . Pneumococcal Polysaccharide-23 08/08/2009, 06/09/2013  . Zoster 05/06/2012    No Known Allergies  Medications: Patient's Medications  New Prescriptions   No medications on file  Previous Medications   ACETAMINOPHEN (TYLENOL) 325 MG TABLET    Take 2 tablets (650 mg total) by mouth every 6 (six) hours as needed for mild pain (or Fever >/= 101).   ALBUTEROL (PROVENTIL) (2.5 MG/3ML) 0.083% NEBULIZER  SOLUTION    Take 3 mLs (2.5 mg total) by nebulization every 4 (four) hours as needed for wheezing or shortness of breath.   ALLOPURINOL (ZYLOPRIM) 300 MG TABLET    Take 300 mg by mouth daily.   ALPRAZOLAM (XANAX) 0.25 MG TABLET    Take 1 tablet (0.25 mg total) by mouth every 12 (twelve) hours as needed for anxiety or sleep.   ATORVASTATIN (LIPITOR) 10 MG TABLET    TAKE 1 TABLET DAILY   CARBOXYMETHYLCELLULOSE (REFRESH PLUS) 0.5 % SOLN    Place 1 drop into both eyes at bedtime.   HYDROXYCHLOROQUINE (PLAQUENIL) 200 MG TABLET    Take 200 mg by mouth 2 (two) times daily.   METOPROLOL SUCCINATE (TOPROL-XL) 25 MG 24 HR TABLET    Take 12.5 mg by mouth daily.   NEOMYCIN-POLYMYXIN B-DEXAMETHASONE (MAXITROL) 3.5-10000-0.1 OINT    Place 1 application into both eyes at bedtime.   OMEGA-3 FATTY ACIDS (FISH OIL) 1200 MG CAPS    Take 1,200 mg by mouth daily.   OXYGEN    Inhale 3-6 L into the lungs continuous.    POTASSIUM CHLORIDE (K-DUR,KLOR-CON) 10 MEQ TABLET    Take 20-40 mEq by mouth 2 (two) times daily. 40 meq in the AM and 20 in the PM   TORSEMIDE (DEMADEX) 20 MG TABLET    Take 40-60 mg by mouth daily. 60 mg in the AM and 40 mg in the PM   VALACYCLOVIR (VALTREX) 1000 MG TABLET    Take 1,000 mg by  mouth 2 (two) times daily.  Modified Medications   No medications on file  Discontinued Medications   CIPROFLOXACIN (CIPRO) 500 MG TABLET    Take 1 tablet (500 mg total) by mouth daily with breakfast. X 4 days    Review of Systems  HENT: Positive for congestion and nosebleeds.   Musculoskeletal: Positive for arthralgias.  All other systems reviewed and are negative.   Vitals:   08/07/16 1200  BP: 99/66  Pulse: 68  Resp: 18  Temp: 98.8 F (37.1 C)  TempSrc: Oral  SpO2: 97%  Weight: 200 lb 9.6 oz (91 kg)  Height: _0  (1.626 m)   Body mass index is 34.43 kg/m.  Physical Exam  Constitutional: She is oriented to person, place, and time. She appears well-developed.  Frail appearing, sitting in w/c in NAD  HENT:  Mouth/Throat: Oropharynx is clear and moist. No oropharyngeal exudate.  Eyes: Pupils are equal, round, and reactive to light. No scleral icterus.  Neck: Neck supple. Carotid bruit is present (b/l systolic from chest ). No tracheal deviation present. No thyromegaly present.  Cardiovascular: Normal rate, regular rhythm and intact distal pulses.  Exam reveals no gallop and no friction rub.   Murmur (3/6 SEM) heard. +1 pitting LE edema b/l. No calf TTP  Pulmonary/Chest: Effort normal and breath sounds normal. No stridor. No respiratory distress. She has no wheezes. She has no rales.  Abdominal: Soft. Bowel sounds are normal. She exhibits no distension and no mass. There is no hepatomegaly. There is no tenderness. There is no rebound and no guarding.  Musculoskeletal: She exhibits edema.  Lymphadenopathy:    She has no cervical adenopathy.  Neurological: She is alert and oriented to person, place, and time.  Skin: Skin is warm and dry. No rash noted.  Right buttock unroofed eschar min bleeding. No secondary signs of infection  Psychiatric: She has a normal mood and affect. Her behavior is normal. Judgment and thought content normal.  Labs reviewed: Lab on  08/03/2016  Component Date Value Ref Range Status  . Glucose 08/02/2016 83  mg/dL Final  . BUN 08/02/2016 38* 4 - 21 mg/dL Final  . Creatinine 08/02/2016 2.0* 0.5 - 1.1 mg/dL Final  . Potassium 08/02/2016 4.7  3.4 - 5.3 mmol/L Final  . Sodium 08/02/2016 143  137 - 147 mmol/L Final  Lab on 08/03/2016  Component Date Value Ref Range Status  . Hemoglobin 07/30/2016 10.1* 12.0 - 16.0 g/dL Final  . HCT 07/30/2016 33* 36 - 46 % Final  . Platelets 07/30/2016 122* 150 - 399 K/L Final  . WBC 07/30/2016 6.3  10^3/mL Final  Admission on 07/12/2016, Discharged on 07/23/2016  No results displayed because visit has over 200 results.    Appointment on 07/09/2016  Component Date Value Ref Range Status  . WBC 07/09/2016 5.9  3.9 - 10.3 10e3/uL Final  . NEUT# 07/09/2016 4.6  1.5 - 6.5 10e3/uL Final  . HGB 07/09/2016 11.4* 11.6 - 15.9 g/dL Final  . HCT 07/09/2016 37.9  34.8 - 46.6 % Final  . Platelets 07/09/2016 88* 145 - 400 10e3/uL Final  . MCV 07/09/2016 98.4  79.5 - 101.0 fL Final  . MCH 07/09/2016 29.6  25.1 - 34.0 pg Final  . MCHC 07/09/2016 30.1* 31.5 - 36.0 g/dL Final  . RBC 07/09/2016 3.85  3.70 - 5.45 10e6/uL Final  . RDW 07/09/2016 16.1* 11.2 - 14.5 % Final  . lymph# 07/09/2016 0.7* 0.9 - 3.3 10e3/uL Final  . MONO# 07/09/2016 0.5  0.1 - 0.9 10e3/uL Final  . Eosinophils Absolute 07/09/2016 0.0  0.0 - 0.5 10e3/uL Final  . Basophils Absolute 07/09/2016 0.0  0.0 - 0.1 10e3/uL Final  . NEUT% 07/09/2016 78.4* 38.4 - 76.8 % Final  . LYMPH% 07/09/2016 12.5* 14.0 - 49.7 % Final  . MONO% 07/09/2016 8.8  0.0 - 14.0 % Final  . EOS% 07/09/2016 0.0  0.0 - 7.0 % Final  . BASO% 07/09/2016 0.3  0.0 - 2.0 % Final  . Retic % 07/09/2016 0.96  0.70 - 2.10 % Final  . Retic Ct Abs 07/09/2016 36.96  33.70 - 90.70 10e3/uL Final  . Immature Retic Fract 07/09/2016 2.70  1.60 - 10.00 % Final  . Sodium 07/09/2016 144  136 - 145 mEq/L Final  . Potassium 07/09/2016 4.2  3.5 - 5.1 mEq/L Final  . Chloride  07/09/2016 104  98 - 109 mEq/L Final  . CO2 07/09/2016 30* 22 - 29 mEq/L Final  . Glucose 07/09/2016 118  70 - 140 mg/dl Final  . BUN 07/09/2016 39.0* 7.0 - 26.0 mg/dL Final  . Creatinine 07/09/2016 2.0* 0.6 - 1.1 mg/dL Final  . Total Bilirubin 07/09/2016 0.59  0.20 - 1.20 mg/dL Final  . Alkaline Phosphatase 07/09/2016 127  40 - 150 U/L Final  . AST 07/09/2016 16  5 - 34 U/L Final  . ALT 07/09/2016 12  0 - 55 U/L Final  . Total Protein 07/09/2016 7.6  6.4 - 8.3 g/dL Final  . Albumin 07/09/2016 3.2* 3.5 - 5.0 g/dL Final  . Calcium 07/09/2016 10.8* 8.4 - 10.4 mg/dL Final  . Anion Gap 07/09/2016 9  3 - 11 mEq/L Final  . EGFR 07/09/2016 29* >90 ml/min/1.73 m2 Final  . Ferritin 07/09/2016 239  9 - 269 ng/ml Final  . Iron 07/09/2016 31* 41 - 142 ug/dL Final  . TIBC 07/09/2016 255  236 - 444 ug/dL Final  . UIBC 07/09/2016 224  120 - 384 ug/dL  Final  . %SAT 07/09/2016 12* 21 - 57 % Final  Appointment on 05/28/2016  Component Date Value Ref Range Status  . Iron 05/28/2016 52  41 - 142 ug/dL Final  . TIBC 05/28/2016 266  236 - 444 ug/dL Final  . UIBC 05/28/2016 215  120 - 384 ug/dL Final  . %SAT 05/28/2016 19* 21 - 57 % Final  . Ferritin 05/28/2016 338* 9 - 269 ng/ml Final  . WBC 05/28/2016 6.0  3.9 - 10.3 10e3/uL Final  . NEUT# 05/28/2016 4.9  1.5 - 6.5 10e3/uL Final  . HGB 05/28/2016 11.4* 11.6 - 15.9 g/dL Final  . HCT 05/28/2016 38.2  34.8 - 46.6 % Final  . Platelets 05/28/2016 87* 145 - 400 10e3/uL Final  . MCV 05/28/2016 97.4  79.5 - 101.0 fL Final  . MCH 05/28/2016 29.1  25.1 - 34.0 pg Final  . MCHC 05/28/2016 29.8* 31.5 - 36.0 g/dL Final  . RBC 05/28/2016 3.92  3.70 - 5.45 10e6/uL Final  . RDW 05/28/2016 18.2* 11.2 - 14.5 % Final  . lymph# 05/28/2016 0.7* 0.9 - 3.3 10e3/uL Final  . MONO# 05/28/2016 0.4  0.1 - 0.9 10e3/uL Final  . Eosinophils Absolute 05/28/2016 0.0  0.0 - 0.5 10e3/uL Final  . Basophils Absolute 05/28/2016 0.0  0.0 - 0.1 10e3/uL Final  . NEUT% 05/28/2016 81.1*  38.4 - 76.8 % Final  . LYMPH% 05/28/2016 12.1* 14.0 - 49.7 % Final  . MONO% 05/28/2016 6.5  0.0 - 14.0 % Final  . EOS% 05/28/2016 0.0  0.0 - 7.0 % Final  . BASO% 05/28/2016 0.3  0.0 - 2.0 % Final  . Retic % 05/28/2016 0.90  0.70 - 2.10 % Final  . Retic Ct Abs 05/28/2016 35.28  33.70 - 90.70 10e3/uL Final  . Immature Retic Fract 05/28/2016 1.60  1.60 - 10.00 % Final  . Sodium 05/28/2016 144  136 - 145 mEq/L Final  . Potassium 05/28/2016 4.5  3.5 - 5.1 mEq/L Final  . Chloride 05/28/2016 105  98 - 109 mEq/L Final  . CO2 05/28/2016 31* 22 - 29 mEq/L Final  . Glucose 05/28/2016 119  70 - 140 mg/dl Final  . BUN 05/28/2016 36.4* 7.0 - 26.0 mg/dL Final  . Creatinine 05/28/2016 2.2* 0.6 - 1.1 mg/dL Final  . Total Bilirubin 05/28/2016 0.36  0.20 - 1.20 mg/dL Final  . Alkaline Phosphatase 05/28/2016 128  40 - 150 U/L Final  . AST 05/28/2016 17  5 - 34 U/L Final  . ALT 05/28/2016 13  0 - 55 U/L Final  . Total Protein 05/28/2016 7.9  6.4 - 8.3 g/dL Final  . Albumin 05/28/2016 3.3* 3.5 - 5.0 g/dL Final  . Calcium 05/28/2016 10.9* 8.4 - 10.4 mg/dL Final  . Anion Gap 05/28/2016 8  3 - 11 mEq/L Final  . EGFR 05/28/2016 26* >90 ml/min/1.73 m2 Final  . Technologist Review 05/28/2016 few shistocytes, few teardrops   Final  Hospital Outpatient Visit on 05/16/2016  Component Date Value Ref Range Status  . Sodium 05/16/2016 142  135 - 145 mmol/L Final  . Potassium 05/16/2016 3.8  3.5 - 5.1 mmol/L Final  . Chloride 05/16/2016 106  101 - 111 mmol/L Final  . CO2 05/16/2016 29  22 - 32 mmol/L Final  . Glucose, Bld 05/16/2016 115* 65 - 99 mg/dL Final  . BUN 05/16/2016 30* 6 - 20 mg/dL Final  . Creatinine, Ser 05/16/2016 1.82* 0.44 - 1.00 mg/dL Final  . Calcium 05/16/2016 10.6* 8.9 - 10.3  mg/dL Final  . GFR calc non Af Amer 05/16/2016 27* >60 mL/min Final  . GFR calc Af Amer 05/16/2016 32* >60 mL/min Final   Comment: (NOTE) The eGFR has been calculated using the CKD EPI equation. This calculation has not  been validated in all clinical situations. eGFR's persistently <60 mL/min signify possible Chronic Kidney Disease.   . Anion gap 05/16/2016 7  5 - 15 Final    Ct Abdomen Pelvis Wo Contrast  Result Date: 07/13/2016 CLINICAL DATA:  Sepsis, diarrhea EXAM: CT ABDOMEN AND PELVIS WITHOUT CONTRAST TECHNIQUE: Multidetector CT imaging of the abdomen and pelvis was performed following the standard protocol without IV contrast. COMPARISON:  CT chest 05/06/2013 FINDINGS: Lower chest: There is dilatation of descending aorta up to 4.1 cm stable from prior exam. There is small right pleural effusion with right base posterior atelectasis. Mild bronchitic changes are noted bilateral lower lobe. Cardiomegaly is noted. Hepatobiliary: Study is limited without IV contrast. Unenhanced liver shows no biliary ductal dilatation. Scattered hepatic cysts are again noted with progression in size from prior exam. The largest cyst in anterior aspect of the right hepatic dome measures 4 cm. Largest cyst in lateral segment of left hepatic lobe measures 3.4 cm. No calcified gallstones are noted within gallbladder. There is micro nodular liver contour. Chronic liver disease or cirrhosis cannot be excluded. Pancreas: No focal abnormality is noted in unenhanced pancreas. Axial image 33 there is mild stranding of the fat surrounding the pancreatic head region. There is mild stranding of the fat inferior to pancreatic head region and just above the distal duodenum please see coronal image 66. Mild focal pancreatitis or duodenal inflammation cannot be excluded. Clinical correlation is necessary. Spleen: Unenhanced spleen is normal. Adrenals/Urinary Tract: Again noted mild thickening of left adrenal gland. The right adrenal gland is stable. There is a cyst in midpole posterior aspect of the left kidney measures 4.1 cm. There is cortical scarring midpole of the right kidney. A cyst in lower pole of the right kidney measures 4.4 cm. No  hydronephrosis or hydroureter. No nephrolithiasis. No calcified calculi are noted within under distended urinary bladder. There is nonobstructive calcified calculus in lower pole of the left kidney measures 4 mm. Stomach/Bowel: Oral contrast material was given to the patient. There is no evidence of small bowel obstruction. No thickened or dilated small bowel loops are noted. There is no pericecal inflammation. Normal appendix is partially visualized in axial image 51. Some colonic gas noted in transverse colon. Multiple descending colon diverticula. The descending colon and sigmoid colon are empty collapsed. Multiple colonic diverticula are noted sigmoid colon. There is redundant sigmoid colon. No evidence of acute pancreatitis. No distal colitis. Some liquid stool noted within rectum. Diarrhea cannot be excluded. Vascular/Lymphatic: No aortic aneurysm. Atherosclerotic calcifications of abdominal aorta and iliac arteries. Reproductive: The uterus is atrophic. There is a cyst/follicle within right ovary measures 2 cm. Other: No free abdominal air.  No large abdominal ascites. Musculoskeletal: Sagittal images of the spine shows degenerative changes thoracolumbar spine. IMPRESSION: 1. Cardiomegaly is noted. Again noted aneurysmal dilatation of descending aorta stable from prior exam. Small right pleural effusion with right base posterior medially atelectasis. 2. Scattered hepatic cysts are noted progression in size from prior exam. 3. Bilateral renal cysts.  Left nonobstructive nephrolithiasis. 4. Micro nodular liver contour. Chronic liver disease or cirrhosis cannot be excluded. 5. There is subtle mild stranding of the fat surrounding the pancreatic head region in medial and inferior to duodenum. Mild focal pancreatitis  or duodenal inflammation cannot be excluded. Clinical correlation is necessary. Study is limited without IV contrast. 6. No small bowel or colonic obstruction. No pericecal inflammation. Normal  appendix. 7. Distal colonic diverticula. No evidence of acute colitis or diverticulitis. 8. There is a dominant follicle/cyst within right ovary measures 2 cm. Electronically Signed   By: Lahoma Crocker M.D.   On: 07/13/2016 13:43   Dg Chest 2 View  Result Date: 07/13/2016 CLINICAL DATA:  Fever.  Sepsis.  Shortness of breath. EXAM: CHEST  2 VIEW COMPARISON:  07/12/2016 and 02/20/2016 and CT scan of the chest dated 05/06/2013 FINDINGS: Chronic cardiomegaly. Tortuosity and chronic aneurysmal dilatation of the thoracic aorta. No acute infiltrates or effusions. Slight linear scarring in both lungs, stable. No acute bone abnormality. IMPRESSION: No acute abnormalities. Chronic cardiomegaly. 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 Electronically Signed   By: Lorriane Shire M.D.   On: 07/13/2016 11:29   US Renal  Result Date: 07/13/2016 CLINICAL DATA:  Sepsis. EXAM: RENAL / URINARY TRACT ULTRASOUND COMPLETE COMPARISON:  None. FINDINGS: Right Kidney: Length: 11.0 cm. Thinning of the renal parenchyma with increased echogenicity. Capsular contours are lobular. No hydronephrosis. There is a 4.3 x 3.9 x 3.7 cm cyst from the lower kidney. Left Kidney: Length: 11.1 cm. There is increased renal echogenicity and mild lobular contours. No hydronephrosis. Two lower pole cysts in measuring 3.6 x 3.5 x 4.6 cm, with an adjacent 1.7 cm cyst. Bladder: Appears normal for degree of bladder distention. IMPRESSION: Findings consistent with chronic medical renal disease. No hydronephrosis. Bilateral renal cysts. Electronically Signed   By: Jeb Levering M.D.   On: 07/13/2016 03:23   Dg Chest Port 1 View  Result Date: 07/12/2016 CLINICAL DATA:  Sepsis.  Fever, shortness of Breath EXAM: PORTABLE CHEST 1 VIEW COMPARISON:  02/20/2016 FINDINGS: Cardiomegaly with vascular congestion and mild bilateral airspace opacities, left slightly greater than right. I favor this represents asymmetric edema. No visible  effusions or acute bony abnormality. IMPRESSION: Suspect mild asymmetric pulmonary edema. Electronically Signed   By: Rolm Baptise M.D.   On: 07/12/2016 15:49   Dg Chest Port 1v Same Day  Result Date: 07/14/2016 CLINICAL DATA:  Respiratory failure with hypoxia EXAM: PORTABLE CHEST 1 VIEW COMPARISON:  07/13/2016 FINDINGS: Sternotomy wires unchanged. Lungs are adequately inflated demonstrate patchy bilateral hilar opacification which may be due to mild interstitial edema, although cannot exclude infection. No evidence of effusion. Stable cardiomegaly. Remainder of the exam is unchanged. IMPRESSION: Patchy perihilar opacification which may be due to mild interstitial edema versus infection. Stable cardiomegaly. Electronically Signed   By: Marin Olp M.D.   On: 07/14/2016 13:29     Assessment/Plan     ICD-9-CM ICD-10-CM   1. Eschar 782.8 R23.4    bleeding  2. CKD stage 4 secondary to hypertension (HCC) 403.90 I12.9    585.4 N18.4   3. PAF (paroxysmal atrial fibrillation) (HCC) 427.31 I48.0   4. Chronic diastolic CHF (congestive heart failure) (HCC) 428.32 I50.32    428.0    5. Chronic respiratory failure with hypoxia (HCC) 518.83 J96.11    799.02    6. Chronic obstructive pulmonary disease, unspecified COPD type ([place]) 496 J44.9   7. Rheumatoid arthritis, involving unspecified site, unspecified rheumatoid factor presence (HCC) 714.0 M06.9   8. Essential hypertension 401.9 I10   9. 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 Anemia of chronic disease 285.29 D63.8     Wound care to right buttock  Follow CBC  f/u with cardio and HF clinic, Heme/Onc  Cont current meds as ordered  PT/OT/ST as ordered  GOAL: short term rehab and d/c home when medically appropriate. Communicated with pt and nursing.  Will follow  Kemba Hoppes S. Perlie Gold  [facility] [address] [place], [place] [address] [phone] Cell (Monday-Friday 8 AM -  5 PM) [phone] After 5 PM and follow prompts

## 2016-08-08 ENCOUNTER — Encounter: Payer: Self-pay | Admitting: Adult Health

## 2016-08-08 ENCOUNTER — Ambulatory Visit (HOSPITAL_COMMUNITY)
Admission: RE | Admit: 2016-08-08 | Discharge: 2016-08-08 | Disposition: A | Discharge status: Home / Self Care | Payer: Medicare Other | Source: Ambulatory Visit | Attending: Cardiology | Admitting: Cardiology

## 2016-08-08 ENCOUNTER — Non-Acute Institutional Stay (SKILLED_NURSING_FACILITY): Payer: Medicare Other | Admitting: Adult Health

## 2016-08-08 VITALS — BP 120/74 | HR 76 | Wt 202.0 lb

## 2016-08-08 DIAGNOSIS — E669 Obesity, unspecified: Secondary | ICD-10-CM | POA: Diagnosis not present

## 2016-08-08 DIAGNOSIS — G4733 Obstructive sleep apnea (adult) (pediatric): Secondary | ICD-10-CM | POA: Diagnosis not present

## 2016-08-08 DIAGNOSIS — Z9989 Dependence on other enabling machines and devices: Secondary | ICD-10-CM

## 2016-08-08 DIAGNOSIS — I272 Pulmonary hypertension, unspecified: Secondary | ICD-10-CM | POA: Insufficient documentation

## 2016-08-08 DIAGNOSIS — Z9981 Dependence on supplemental oxygen: Secondary | ICD-10-CM | POA: Insufficient documentation

## 2016-08-08 DIAGNOSIS — I340 Nonrheumatic mitral (valve) insufficiency: Secondary | ICD-10-CM | POA: Insufficient documentation

## 2016-08-08 DIAGNOSIS — J9610 Chronic respiratory failure, unspecified whether with hypoxia or hypercapnia: Secondary | ICD-10-CM | POA: Diagnosis not present

## 2016-08-08 DIAGNOSIS — N184 Chronic kidney disease, stage 4 (severe): Secondary | ICD-10-CM | POA: Diagnosis not present

## 2016-08-08 DIAGNOSIS — A419 Sepsis, unspecified organism: Secondary | ICD-10-CM

## 2016-08-08 DIAGNOSIS — J449 Chronic obstructive pulmonary disease, unspecified: Secondary | ICD-10-CM

## 2016-08-08 DIAGNOSIS — N183 Chronic kidney disease, stage 3 unspecified: Secondary | ICD-10-CM

## 2016-08-08 DIAGNOSIS — I5033 Acute on chronic diastolic (congestive) heart failure: Secondary | ICD-10-CM | POA: Diagnosis not present

## 2016-08-08 DIAGNOSIS — J9611 Chronic respiratory failure with hypoxia: Secondary | ICD-10-CM

## 2016-08-08 DIAGNOSIS — B029 Zoster without complications: Secondary | ICD-10-CM | POA: Insufficient documentation

## 2016-08-08 DIAGNOSIS — M069 Rheumatoid arthritis, unspecified: Secondary | ICD-10-CM | POA: Insufficient documentation

## 2016-08-08 DIAGNOSIS — I5032 Chronic diastolic (congestive) heart failure: Secondary | ICD-10-CM | POA: Insufficient documentation

## 2016-08-08 DIAGNOSIS — I481 Persistent atrial fibrillation: Secondary | ICD-10-CM | POA: Diagnosis not present

## 2016-08-08 DIAGNOSIS — Z87891 Personal history of nicotine dependence: Secondary | ICD-10-CM | POA: Diagnosis not present

## 2016-08-08 DIAGNOSIS — I4819 Other persistent atrial fibrillation: Secondary | ICD-10-CM

## 2016-08-08 DIAGNOSIS — I130 Hypertensive heart and chronic kidney disease with heart failure and stage 1 through stage 4 chronic kidney disease, or unspecified chronic kidney disease: Secondary | ICD-10-CM | POA: Insufficient documentation

## 2016-08-08 DIAGNOSIS — D649 Anemia, unspecified: Secondary | ICD-10-CM | POA: Diagnosis not present

## 2016-08-08 MED ORDER — TORSEMIDE 20 MG PO TABS: 60.0000 mg | ORAL_TABLET | Freq: Two times a day (BID) | ORAL | 3 refills | Status: DC

## 2016-08-08 NOTE — Progress Notes (Signed)
Location:   [facility]   Place of Service:  SNF (31)    CODE STATUS: dnr  No Known Allergies  Chief Complaint  Patient presents with  . Discharge Note    HPI:  She is being discharged to home with home health for pt/ot/rn. She will need a neb machine and 3:1 commode. She will need her prescriptions to be written and will need to follow up with her medical provider.  She had been hospitalized for acute on chronic heart failure and sepsis from uti. She was admitted to this facility for short term rehab.     Past Medical History:  Diagnosis Date  . Anemia 03/23/15   transfusion  . Anxiety   . Aortic aneurysm ([place])   . Arthritis    "qwhere" (02/20/2016)  . CHF (congestive heart failure) ([place])   . Chronic lower back pain   . CKD (chronic kidney disease) stage 3, GFR 30-59 ml/min    "on dialysis for 3 months after my aneurysm in 2005" (02/20/2016)  . COPD (chronic obstructive pulmonary disease) ([place])   . Gout   . Heart murmur   . History of blood transfusion "several"  . Hypertension   . Lupus   . On home oxygen therapy    "3L; 24/7" (02/20/2016)  . OSA on CPAP   . Pulmonary HTN   . Rheumatoid arthritis(714.0) 11/06/2012  . Tuberculosis    dormant carrier    Past Surgical History:  Procedure Laterality Date  . ABDOMINAL AORTIC ANEURYSM REPAIR  2005  . BREAST LUMPECTOMY Right   . CARDIAC CATHETERIZATION N/A 10/24/2015   Procedure: Right Heart Cath;  Surgeon: Larey Dresser, MD;  Location: [facility];  Service: Cardiovascular;  Laterality: N/A;  . CATARACT EXTRACTION W/ INTRAOCULAR LENS  IMPLANT, BILATERAL Bilateral 2016  . COLONOSCOPY N/A 08/19/2015   Procedure: COLONOSCOPY;  Surgeon: Jerene Bears, MD;  Location: [facility] ENDOSCOPY;  Service: Endoscopy;  Laterality: N/A;  . ENTEROSCOPY N/A 02/22/2016   Procedure: ENTEROSCOPY;  Surgeon: Milus Banister, MD;  Location: [facility];  Service: Endoscopy;  Laterality: N/A;  . ESOPHAGOGASTRODUODENOSCOPY N/A 08/19/2015   Procedure: ESOPHAGOGASTRODUODENOSCOPY (EGD);  Surgeon: Jerene Bears, MD;  Location: [facility] ENDOSCOPY;  Service: Endoscopy;  Laterality: N/A;  patient scheduled, anesthesia aware of 1500 case, per [place].  08/18/15 DP  . GIVENS CAPSULE STUDY N/A 08/21/2015   Procedure: GIVENS CAPSULE STUDY;  Surgeon: Gatha Mayer, MD;  Location: [place];  Service: Endoscopy;  Laterality: N/A;  . HEMORRHOID SURGERY    . RIGHT HEART CATHETERIZATION N/A 08/18/2014   Procedure: RIGHT HEART CATH;  Surgeon: Jolaine Artist, MD;  Location: [facility] CATH LAB;  Service: Cardiovascular;  Laterality: N/A;  . TEE WITHOUT CARDIOVERSION N/A 01/05/2015   Procedure: TRANSESOPHAGEAL ECHOCARDIOGRAM (TEE);  Surgeon: Larey Dresser, MD;  Location: [place];  Service: Cardiovascular;  Laterality: N/A;  . WISDOM TOOTH EXTRACTION      Social History   Social History  . Marital status: Legally Separated    Spouse name: N/A  . Number of children: 1  . Years of education: N/A   Occupational History  . Disability     Office Work   Social History Main Topics  . Smoking status: Former Smoker    Packs/day: 1.50    Years: 14.00    Types: Cigarettes    Quit date: 08/09/2003  . Smokeless tobacco: Never Used  . Alcohol use 0.0 oz/week     Comment: 02/20/2016 "drank  a little in my teens"  . Drug use: No  . Sexual activity: No   Other Topics Concern  . Not on file   Social History Narrative  . No narrative on file   Family History  Problem Relation Age of Onset  . Heart attack Mother   . Prostate cancer Father   . Diabetes Brother   . Kidney failure Brother     VITAL SIGNS BP 100/60   Pulse 68   Temp 98.8 F (37.1 C)   Resp 18   Ht 5\' 4"  (1.626 m)   Wt 199 lb 12.8 oz (90.6 kg)   SpO2 96%   BMI 34.30 kg/m   Patient's Medications  New Prescriptions   No medications on file  Previous Medications   ACETAMINOPHEN (TYLENOL) 325 MG TABLET    Take 2 tablets (650 mg total) by mouth every 6 (six) hours as needed for  mild pain (or Fever >/= 101).   ALBUTEROL (PROVENTIL) (2.5 MG/3ML) 0.083% NEBULIZER SOLUTION    Take 3 mLs (2.5 mg total) by nebulization every 4 (four) hours as needed for wheezing or shortness of breath.   ALLOPURINOL (ZYLOPRIM) 300 MG TABLET    Take 300 mg by mouth daily.   ALPRAZOLAM (XANAX) 0.25 MG TABLET    Take 1 tablet (0.25 mg total) by mouth every 12 (twelve) hours as needed for anxiety or sleep.   ATORVASTATIN (LIPITOR) 10 MG TABLET    TAKE 1 TABLET DAILY   CARBOXYMETHYLCELLULOSE (REFRESH PLUS) 0.5 % SOLN    Place 1 drop into both eyes at bedtime.   HYDROXYCHLOROQUINE (PLAQUENIL) 200 MG TABLET    Take 200 mg by mouth 2 (two) times daily.   METOPROLOL SUCCINATE (TOPROL-XL) 25 MG 24 HR TABLET    Take 12.5 mg by mouth daily.   NEOMYCIN-POLYMYXIN B-DEXAMETHASONE (MAXITROL) 3.5-10000-0.1 OINT    Place 1 application into both eyes at bedtime.   OMEGA-3 FATTY ACIDS (FISH OIL) 1200 MG CAPS    Take 1,200 mg by mouth daily.   OXYGEN    Inhale 3-6 L into the lungs continuous.    POTASSIUM CHLORIDE (K-DUR,KLOR-CON) 10 MEQ TABLET    Take 20-40 mEq by mouth 2 (two) times daily. 40 meq in the AM and 20 in the PM   TORSEMIDE (DEMADEX) 20 MG TABLET    Take 3 tablets (60 mg total) by mouth 2 (two) times daily.  Modified Medications   No medications on file  Discontinued Medications   VALACYCLOVIR (VALTREX) 1000 MG TABLET    Take 1,000 mg by mouth 2 (two) times daily.     SIGNIFICANT DIAGNOSTIC EXAMS   07-13-16: 2-d echo: - Left ventricle: The cavity size was normal. There was mild concentric hypertrophy. Systolic function was normal. The estimated ejection fraction was in the range of 55% to 60%. Wall motion was normal; there were no regional wall motion abnormalities. Features are consistent with a pseudonormal left ventricular filling pattern, with concomitant abnormal relaxation and increased filling pressure (grade 2 diastolic dysfunction).   Doppler parameters are consistent with high  ventricular filling pressure. - Descending aorta: The descending aorta was mildly dilated. - Mitral valve: There was mild regurgitation. - Left atrium: The atrium was severely dilated. - Right ventricle: Systolic function was mildly reduced. - Atrial septum: There was a medium-sized atrial septal aneurysm, with free respirophasic mobility between right and left atrial cavities. - Tricuspid valve: There was mild-moderate regurgitation. - Pulmonary arteries: PA peak pressure: 66 mm Hg (S).  Impressions: - The right ventricular systolic pressure was increased consistent with moderate pulmonary hypertension.  07-13-16: renal ultrasound: Findings consistent with chronic medical renal disease. No hydronephrosis. Bilateral renal cysts.  07-13-16: chest x-ray; No acute abnormalities. Chronic cardiomegaly. 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   07-13-16; ct of abdomen and pelvis: 1. Cardiomegaly is noted. Again noted aneurysmal dilatation of descending aorta stable from prior exam. Small right pleural effusion with right base posterior medially atelectasis. 2. Scattered hepatic cysts are noted progression in size from prior exam. 3. Bilateral renal cysts.  Left nonobstructive nephrolithiasis. 4. Micro nodular liver contour. Chronic liver disease or cirrhosis cannot be excluded. 5. There is subtle mild stranding of the fat surrounding the pancreatic head region in medial and inferior to duodenum. Mild focal pancreatitis or duodenal inflammation cannot be excluded. Clinical correlation is necessary. Study is limited without IV contrast. 6. No small bowel or colonic obstruction. No pericecal inflammation. Normal appendix. 7. Distal colonic diverticula. No evidence of acute colitis or diverticulitis. 8. There is a dominant follicle/cyst within right ovary measures 2 cm.   07-14-16: chest x-ray: Patchy perihilar opacification which may be due to mild interstitial edema versus  infection. Stable cardiomegaly  LABS REVIEWED:   07-12-16: wbc 17.5; hgb 11.4; hct 37.4; mcv 96.9; plt 68; glucose 116; bun 46; creat 2.94; k+ 4.5; na++ 137; liver normal albumin 3.1; urine culture: e-coli; blood culture: e-coli.  07-14-16: wbc 12.8; hgb 10.4; hct 34.7; mcv 97.2; plt 59; glucose 127; bun 53; creat 2.76; k+ 4.5; na++ 136; liver normal albumin 2.6 07-18-16: wbc 11.7; hgb 10.5; hct 33.9; mcv 94.7; plt 94; glucose 140; bun 67; creat 2.17; k+ 3.4; na++ 140; phos 2.4; mag 1.9 07-23-16: wbc 8.9; hgb 10.2 hct 32.8; mcv 94.3 plt 172; glucose 111; bun 55; creat 2.76; k+ 4.3; na++ 139    Review of Systems  Constitutional: Negative for malaise/fatigue.  Respiratory: Negative for cough and shortness of breath.   Cardiovascular: Negative for chest pain, palpitations and leg swelling.  Gastrointestinal: Negative for abdominal pain, constipation and heartburn.  Musculoskeletal: Negative for back pain, joint pain and myalgias.  Skin: Negative.   Neurological: Negative for dizziness.  Psychiatric/Behavioral: The patient is not nervous/anxious.     Physical Exam  Constitutional: She is oriented to person, place, and time. No distress.  Eyes: Conjunctivae are normal.  Neck: Neck supple. No JVD present. No thyromegaly present.  Cardiovascular: Normal rate, regular rhythm and intact distal pulses.   Respiratory: Effort normal and breath sounds normal. No respiratory distress. She has no wheezes.  Is 02 dependent   GI: Soft. Bowel sounds are normal. She exhibits no distension. There is no tenderness.  Musculoskeletal: She exhibits no edema.  Able to move all extremities   Lymphadenopathy:    She has no cervical adenopathy.  Neurological: She is alert and oriented to person, place, and time.  Skin: Skin is warm and dry. She is not diaphoretic.  Psychiatric: She has a normal mood and affect.    ASSESSMENT/ PLAN:  Patient is being discharged with the following home health services:   Pt/ot/rn: to evaluate and treat as indicated for gait balance strength adl training and medication management   Patient is being discharged with the following durable medical equipment:   Neb machine and 3:1 commode   Patient has been advised to f/u with their PCP in 1-2 weeks to bring them up to date on their rehab stay.  Social services at facility was responsible  for arranging this appointment.  Pt was provided with a 30 day supply of prescriptions for medications and refills must be obtained from their PCP.  For controlled substances, a more limited supply may be provided adequate until PCP appointment only. #20 xanax 0.25 mg tabs.     Time spent with patient  40  minutes >50% time spent counseling; reviewing medical record; tests; labs; and developing future plan of care    Ok Edwards NP [facility]  Contact [phone] Monday through Friday 8am- 5pm  After hours call [phone]

## 2016-08-08 NOTE — Patient Instructions (Signed)
INCREASE Torsemide to 60 mg twice daily.  Cover right buttock with aleven foam dressing and change once weekly and as needed.  Will refer to [place] services for transition to home from facility.  Follow up 2 weeks with Amy Clegg NP-C.  Do the following things EVERYDAY: 1) Weigh yourself in the morning before breakfast. Write it down and keep it in a log. 2) Take your medicines as prescribed 3) Eat low salt foods-Limit salt (sodium) to 2000 mg per day.  4) Stay as active as you can everyday 5) Limit all fluids for the day to less than 2 liters

## 2016-08-08 NOTE — Progress Notes (Signed)
Patient ID: April Mueller, female   DOB: 02-11-1946, 71 y.o.   MRN: [id number]   HPI  PCP: Dr Harrington Challenger Pulmonary: Dr Byrum/Dr Melvyn Novas  Cardiology: Dr. Aundra Dubin  April Mueller is a 71 yo with COPD on home oxygen, rheumatoid arthritis, chronic atrial fibrillation, chronic diastolic CHF, CKD.    Moved back from [place] and saw Dr. Lamonte Sakai. In [place] was followed by cardiology and pulmonary. Started on Tracleer in 2006. Revatio was added, however was discontinued without any change in her symptoms. Stopped smoking in January 2005 when she had aortic aneurysm repair. Smoked 1.5-2 ppd x 15 - 20 years.  Later on macitentan.  I had her do a TEE in 6/16.  She had a loud MR murmur.  TEE showed very eccentric, posteriorly-directed MR that may be from subtle prolapse of the anterior mitral leaflet.  I am concerned that the MR could be severe but very difficult to fully visualize.  She additionally had PFTs done that were abnormal, but according to her pulmonologist Melvyn Novas), the obstruction was really only minimal and the restriction was primarily related to body habitus.  He does not think that we can explain her hypoxemia and dyspnea primarily from parenchymal lung pathology.  I sent her to [facility] for evaluation for percutaneous mitral valve clip.  They did not think that she was a good candidate.   She has been anemic and received 1 unit PRBCs in 8/16.  She saw GI and was noted to be heme negative, no scopes were done (heme negative and high risk). She has had iron infusions.   Opsumit was stopped in 9/16 due to suspicion for group 2 pulmonary hypertension only (low PVR) and she felt better off it.   She was admitted in 1/17 with increased dyspnea and was found to be profoundly anemic.  She had a total of 5 units PRBCs and an iron infusion in the hospital.  Stool was FOBT+, melena.  EGD, colonoscopy, and capsule endoscopy were all done without clear source of blood loss.  While in the hospital, she was noted to go into atrial  fibrillation with RVR.  She initially required amiodarone for rate control, but was transitioned over to Toprol XL. She was not anticoagulated due to concern for GI blood loss and anemia.  Anticoagulation was attempted later.   RHC in 3/17 showed elevated right heart filling pressures and mildly elevated PCWP.  There was mild pulmonary hypertension, likely pulmonary venous hypertension with PVR 1.9 WU.   She was admitted in 7/17 with symptomatic anemia, received 3 units PRBCs.  Anticoagulation was stopped.  Small bowel enteroscopy did not show a source of bleeding.    She was seen by Dr Rayann Heman, he recommended against Watchman. She is now off anticoagulation.  Getting Fe infusions.  Admitted 12/7 through 07/23/16. Treated for UTI and volume overload. Discharged to [facility] ongoing rehab. Discharge weight  196 pounds.   She returns for follow up. Since the last visit she has developed shingles on R buttock. Plans to be discharged home tomorrow.Completing therapy at SNF.  Complaining of fatigue. Remains SOB with exertion. Weight at SNF 199 pounds.  Appetite poor. Uses CPAP intermittently. She is currently at [facility].  Labs 02/05/13 K 3.8 Creatinine 1.37 05/08/13 K 3.8, Creatinine 1.58 11/14 K 4.1, creatinine 1.82 08/09/2014: K 4.0 Creatinine 1.45  5/16: K 3.7, creatinine 1.76, BNP 447 6/16: K 5 => 4.6, creatinine 2.09 => 2.13, BNP 291 8/16: K 4.7, creatinine 2.1, HCT 30.7 9/16:  K 4.5, creatinine 2.3, HCT 34.1 1/17: K 4.8, creatinine 1.84 => 1.87, HCT 29.7, plts 136, FOBT+, BNP 403 2/17: K 3.5, creatinine 1.89, BNP 329, hgb 10 3/17: K 3.8, creatinine 2.5 => 2.3, HCT 35.4 => 33.5 5/17: K 3.7, creatinine 2.0, hgb 10.6 7/17: K 3.7, creatinine 2, hgb 9.9 8/17: K 3.8, creatinine 1.9, HCT 31.3 9/17: K 3.9, creatinine 2 07/23/2016: K 4.3 Creatinine 2.76  08/02/2016: K 4.7 Creatinine 2.0   PMH:  1) COPD       --former smoker (28 pk-yrs)       --on home O2 - 4L/min rest, up to 5L/min  exertion.        --spirometry 6/13 in [place] = 0.85L FVC = 1.3L no DLCO measured. FEV1 in 2014 = 0.73        --CT chest 10/14 - moderate centrilobular emphysema. No pulm fibrosis but there was evidence for post-infectious/inflammatory scarring. Stable Asc Ao repair       --PFTs (11/14) FVC 46%, FEV1 0.8L (40%), ratio 87%, FEF 25-75% 0.44L DLCO 22%, TLC 50% => PFTs in 06/2013 did NOT suggest significant airflow obstruction according to Dr Gustavus Bryant last note despite "moderate emphysema" on CT.        --PFTs (6/16) were abnormal with severe obstruction, severe restriction, severely decreased DLCO.  Reviewed with Dr Melvyn Novas => he thinks obstruction is actually fairly minimal and that the restriction is due to body habitus.   2) Obesity 3) RA       --on plaquenil, sees Dr Lenna Gilford.  4) Type A aortic dissection s/p repair 2005     --c/b renal failure requiring HD x 3 months 5) PAH - previously treated in [place].  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      --on macitentan (revatio stopped 10/14).     --V/Q scan 11/14 no chronic PE     -- 2015 Tracleer stopped and started on macitentan. 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      -- RHC 08/18/2014: No role for pulmonary vasodilators.  RA = 14 RV = 62/3/15 PA = 63/21 (38) PCW = mean 23 v waves to 40 Fick cardiac output/index = 6.3/3.2 Thermodilution CO/CI = 6.1/3.1, PVR = 2.0 WU, FA sat = 91% PA sat = 57%, 62%     -- RHC 3/17: mean RA 13, PA 44/2 mean 29, mean PCWP 17, CI 3.04, PVR 1.9 WU => pulmonary venous hypertension. 6) OSA 7) Diastolic CHF    --ECHO [id number] EF 55-60% RV mildly dilated with normal function. Mild to moderate posterior MR. RVSP 56. Probable restrictive diastolic filling pattern    --ECHO 9/15 EF 60-65% RV mildly dilated with normal function. Mild to moderate posterior MR. RVSP 40. Ao Root ok    --TEE 6/16 with EF 55-60%, D-shaped interventricular septum suggestive of RV pressure/volume overload, RV mildly dilated with  normal systolic function, peak RV-RA gradient 57 mmHg, very eccentric posteriorly-directed mitral regurgitation, possibly severe, etiology may be a very small area of prolapse on the anterior leaflet, s/p ascending aorta repair with residual dissection flap in the descending thoracic aorta.       --Echo (1/17) with EF 55-60%, mild to moderate MR, PASP 66 8) CKD 9) Mitral regurgitation: Possibly severe by 6/16 TEE, very eccentric.  Not good candidate for percutaneous MV clip (seen at [facility]).  Echo (1/17) with only mild to moderate MR.  10) Anemia: Suspect from chronic disease/renal disease and well as chronic GI bleeding.  EGD/colonoscopy/capsule  endoscopy in 1/17 did not show any definite site for blood loss.  Small bowel enteroscopy in 7/17 did not show etiology of bleeding. 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  Getting Fe infusions.  11) Atrial fibrillation: Persistent, 1st noted in 1/17.  12) ECHO - EF 55-60% Grade II DD. Peak PA pressure 66 mmhg. Mild MR. RV mildly reduced.   ROS: All systems reviewed and negative except as per HPI.  Current Outpatient Prescriptions on File Prior to Encounter  Medication Sig Dispense Refill  . acetaminophen (TYLENOL) 325 MG tablet Take 2 tablets (650 mg total) by mouth every 6 (six) hours as needed for mild pain (or Fever >/= 101).    [name] albuterol (PROVENTIL) (2.5 MG/3ML) 0.083% nebulizer solution Take 3 mLs (2.5 mg total) by nebulization every 4 (four) hours as needed for wheezing or shortness of breath. 75 mL 12  . allopurinol (ZYLOPRIM) 300 MG tablet Take 300 mg by mouth daily.    [name] ALPRAZolam (XANAX) 0.25 MG tablet Take 1 tablet (0.25 mg total) by mouth every 12 (twelve) hours as needed for anxiety or sleep. 60 tablet 5  . atorvastatin (LIPITOR) 10 MG tablet TAKE 1 TABLET DAILY 90 tablet 2  . carboxymethylcellulose (REFRESH PLUS) 0.5 % SOLN Place 1 drop into both eyes at bedtime.    . hydroxychloroquine (PLAQUENIL) 200 MG tablet Take 200 mg by mouth 2 (two)  times daily.    . metoprolol succinate (TOPROL-XL) 25 MG 24 hr tablet Take 12.5 mg by mouth daily.    [name] neomycin-polymyxin b-dexamethasone (MAXITROL) 3.5-10000-0.1 OINT Place 1 application into both eyes at bedtime.    . Omega-3 Fatty Acids (FISH OIL) 1200 MG CAPS Take 1,200 mg by mouth daily.    . OXYGEN Inhale 3-6 L into the lungs continuous.     . potassium chloride (K-DUR,KLOR-CON) 10 MEQ tablet Take 20-40 mEq by mouth 2 (two) times daily. 40 meq in the AM and 20 in the PM    . torsemide (DEMADEX) 20 MG tablet Take 40-60 mg by mouth daily. 60 mg in the AM and 40 mg in the PM    . valACYclovir (VALTREX) 1000 MG tablet Take 1,000 mg by mouth 2 (two) times daily.     No current facility-administered medications on file prior to encounter.    Social History   Social History  . Marital status: Legally Separated    Spouse name: N/A  . Number of children: 1  . Years of education: N/A   Occupational History  . Disability     Office Work   Social History Main Topics  . Smoking status: Former Smoker    Packs/day: 1.50    Years: 14.00    Types: Cigarettes    Quit date: 08/09/2003  . Smokeless tobacco: Never Used  . Alcohol use 0.0 oz/week     Comment: 02/20/2016 "drank a little in my teens"  . Drug use: No  . Sexual activity: No   Other Topics Concern  . Not on file   Social History Narrative  . No narrative on file   Family History  Problem Relation Age of Onset  . Heart attack Mother   . Prostate cancer Father   . Diabetes Brother   . Kidney failure Brother     Vitals:   08/08/16 0934  BP: 120/74  BP Location: Right Arm  Patient Position: Sitting  Cuff Size: Normal  Pulse: 76  SpO2: 92%  Weight: 202 lb (91.6 kg)   Physical Exam Gen:  Pleasant, well-nourished, in no distress, normal affect; on O2 by [place].  Arrived in a wheel chair.  HEENT: normal Neck: JVP 11-12 cm  Lungs: Clear on 3 liters oxygen.   Cardiovascular: PMI normal. Irregular S1S2, no S3/S4, 2/6 HSM  LLSB/apex. Rand LLE 1-2 edema bilaterally. Extremities: No deformities, no cyanosis or clubbing.  Neuro: alert, non focal. Cranial nerves intact. Moves all 4 extremities without difficulty Skin: Warm, no lesions . R buttock shingles.   Assessment/Plan: 1. Chronic diastolic CHF with pulmonary hypertension/RV failure:  NYHA IIIb. Volume status elevated. Increase torsemide to 60 mg twice a day.  Continue potassium 40 qam/20 qpm.   Referred to [facility] for [facility] and tele monitoring with diuretic protocol.  2. Chronic respiratory failure:  She is on 3 liters at rest. 4-5 L with exertion.  home oxygen by [place]. She has COPD and restriction from her body habitus.  3. OSA: Continue CPAP nightly.  4. CKD: Stage III-IV. Creatinine baseline 2-2.2 5. Pulmonary hypertension: This is primarily [place] group 2 (pulmonary venous hypertension) based on RHC in 3/17.  She is not a good candidate for pulmonary vasodilators.   6 Mitral regurgitation:   recent echo in 07/2016 mild MR on ECHO.  7. Anemia: Suspect anemia of chronic disease/renal disease but also with component of GI blood loss.  She has had GI bleeding with FOBT+, but unable to visualized bleeding source on EGD, colonoscopy, enteroscopy, or capsule endoscopy (most recently in 7/17).  Seeing hematology, getting Aranesp and Fe infusions. Some concern now for possible myelodysplastic syndrome.  8. Atrial fibrillation: Persistent.  Rate is controlled.  CHADSVASC = 3.  She was evaluated for Watchman but decided not to be a good candidate. Due to recurrent severe GI bleeding, anticoagulation was stopped altogether. No stroke-like symptoms.  9. Deconditioning- continue PT at SNF followed by HHPT 10: Shingles- Treated by SNF MD- Cover open wounds on R buttock with allevyn foam.   Follow up in 2-3 weeks .  Amy Clegg NP_C  08/08/2016

## 2016-08-09 ENCOUNTER — Telehealth (HOSPITAL_COMMUNITY): Payer: Self-pay | Admitting: *Deleted

## 2016-08-09 MED ORDER — METOPROLOL SUCCINATE ER 25 MG PO TB24
25.0000 mg | ORAL_TABLET | Freq: Two times a day (BID) | ORAL | Status: DC
Start: 2016-08-09 — End: 2016-10-22

## 2016-08-09 NOTE — Telephone Encounter (Signed)
Pt called to report that this AM her heart is racing and she is a little more SOB.  She states this has been going on off/on for about 3-4 days.  She did not mention it at her appt yesterday b/c she was fine at that time and didn't really think about it.  She feels this is happening b/c her metoprolol was cut back to 12.5 mg daily.  She is unable to check HR now but states it is faster than normal.  She denies any dizziness or lightheadedness.  Discussed all above w/Dr Aundra Dubin, he would like pt to increase Metoprolol to 25 mg BID, her dose prior to being hospitalized.  Pt aware and agreeable.

## 2016-08-10 ENCOUNTER — Telehealth: Payer: Self-pay | Admitting: Hematology

## 2016-08-10 DIAGNOSIS — I5032 Chronic diastolic (congestive) heart failure: Secondary | ICD-10-CM | POA: Diagnosis not present

## 2016-08-10 DIAGNOSIS — N189 Chronic kidney disease, unspecified: Secondary | ICD-10-CM | POA: Diagnosis not present

## 2016-08-10 DIAGNOSIS — I130 Hypertensive heart and chronic kidney disease with heart failure and stage 1 through stage 4 chronic kidney disease, or unspecified chronic kidney disease: Secondary | ICD-10-CM | POA: Diagnosis not present

## 2016-08-10 DIAGNOSIS — J449 Chronic obstructive pulmonary disease, unspecified: Secondary | ICD-10-CM | POA: Diagnosis not present

## 2016-08-10 NOTE — Telephone Encounter (Signed)
Pt called to cxl 1/8 appt. per pt just got out the hospital and don't feel up to coming in

## 2016-08-13 ENCOUNTER — Other Ambulatory Visit: Payer: Medicare Other

## 2016-08-13 ENCOUNTER — Ambulatory Visit: Payer: Medicare Other

## 2016-08-16 ENCOUNTER — Telehealth (HOSPITAL_COMMUNITY): Payer: Self-pay

## 2016-08-16 NOTE — Telephone Encounter (Signed)
[facility] RN called to report patient weight stable at 198 lbs, no edema noted, however, "a little fluid" auscultated over RLL. Patient on 3L home O2 continuous (baseline). No more SOB than normal. Per Dr. Aundra Dubin, advised to take 1 extra torsemide tablet (takes 60 mg BID) now and follow up with our clinic in the morning to update s/s. Advised if s/s become worse to seek emergency medical care.  Renee Pain, RN

## 2016-08-21 ENCOUNTER — Ambulatory Visit (HOSPITAL_COMMUNITY)
Admission: RE | Admit: 2016-08-21 | Discharge: 2016-08-21 | Disposition: A | Discharge status: Home / Self Care | Payer: Medicare Other | Source: Ambulatory Visit | Attending: Cardiology | Admitting: Cardiology

## 2016-08-21 VITALS — BP 102/70 | HR 101 | Wt 200.4 lb

## 2016-08-21 DIAGNOSIS — J9610 Chronic respiratory failure, unspecified whether with hypoxia or hypercapnia: Secondary | ICD-10-CM | POA: Insufficient documentation

## 2016-08-21 DIAGNOSIS — I482 Chronic atrial fibrillation: Secondary | ICD-10-CM | POA: Insufficient documentation

## 2016-08-21 DIAGNOSIS — Z87891 Personal history of nicotine dependence: Secondary | ICD-10-CM | POA: Insufficient documentation

## 2016-08-21 DIAGNOSIS — I4819 Other persistent atrial fibrillation: Secondary | ICD-10-CM

## 2016-08-21 DIAGNOSIS — I130 Hypertensive heart and chronic kidney disease with heart failure and stage 1 through stage 4 chronic kidney disease, or unspecified chronic kidney disease: Secondary | ICD-10-CM | POA: Insufficient documentation

## 2016-08-21 DIAGNOSIS — M069 Rheumatoid arthritis, unspecified: Secondary | ICD-10-CM | POA: Insufficient documentation

## 2016-08-21 DIAGNOSIS — G4733 Obstructive sleep apnea (adult) (pediatric): Secondary | ICD-10-CM | POA: Insufficient documentation

## 2016-08-21 DIAGNOSIS — N184 Chronic kidney disease, stage 4 (severe): Secondary | ICD-10-CM | POA: Insufficient documentation

## 2016-08-21 DIAGNOSIS — I272 Pulmonary hypertension, unspecified: Secondary | ICD-10-CM | POA: Insufficient documentation

## 2016-08-21 DIAGNOSIS — D649 Anemia, unspecified: Secondary | ICD-10-CM | POA: Diagnosis not present

## 2016-08-21 DIAGNOSIS — I5032 Chronic diastolic (congestive) heart failure: Secondary | ICD-10-CM | POA: Diagnosis not present

## 2016-08-21 DIAGNOSIS — I340 Nonrheumatic mitral (valve) insufficiency: Secondary | ICD-10-CM | POA: Insufficient documentation

## 2016-08-21 DIAGNOSIS — E669 Obesity, unspecified: Secondary | ICD-10-CM | POA: Insufficient documentation

## 2016-08-21 DIAGNOSIS — I350 Nonrheumatic aortic (valve) stenosis: Secondary | ICD-10-CM | POA: Diagnosis not present

## 2016-08-21 DIAGNOSIS — Z9981 Dependence on supplemental oxygen: Secondary | ICD-10-CM | POA: Insufficient documentation

## 2016-08-21 DIAGNOSIS — I481 Persistent atrial fibrillation: Secondary | ICD-10-CM

## 2016-08-21 DIAGNOSIS — J449 Chronic obstructive pulmonary disease, unspecified: Secondary | ICD-10-CM | POA: Diagnosis not present

## 2016-08-21 NOTE — Addendum Note (Signed)
Encounter addended by: Kerry Dory, CMA on: 08/21/2016  9:51 AM<BR>    Actions taken: Sign clinical note

## 2016-08-21 NOTE — Patient Instructions (Signed)
Your physician recommends that you schedule a follow-up appointment in: 2 months  

## 2016-08-21 NOTE — Progress Notes (Signed)
Patient ID: April Mueller, female   DOB: 14-Nov-1945, 71 y.o.   MRN: [id number]   HPI  PCP: Dr Harrington Challenger Pulmonary: Dr Byrum/Dr Melvyn Novas  Cardiology: Dr. Aundra Dubin  April Mueller is a 71 yo with COPD on home oxygen, rheumatoid arthritis, chronic atrial fibrillation, chronic diastolic CHF, CKD.    Moved back from [place] and saw Dr. Lamonte Sakai. In [place] was followed by cardiology and pulmonary. Started on Tracleer in 2006. Revatio was added, however was discontinued without any change in her symptoms. Stopped smoking in January 2005 when she had aortic aneurysm repair. Smoked 1.5-2 ppd x 15 - 20 years.  Later on macitentan.  I had her do a TEE in 6/16.  She had a loud MR murmur.  TEE showed very eccentric, posteriorly-directed MR that may be from subtle prolapse of the anterior mitral leaflet.  I am concerned that the MR could be severe but very difficult to fully visualize.  She additionally had PFTs done that were abnormal, but according to her pulmonologist Melvyn Novas), the obstruction was really only minimal and the restriction was primarily related to body habitus.  He does not think that we can explain her hypoxemia and dyspnea primarily from parenchymal lung pathology.  I sent her to [facility] for evaluation for percutaneous mitral valve clip.  They did not think that she was a good candidate.   She has been anemic and received 1 unit PRBCs in 8/16.  She saw GI and was noted to be heme negative, no scopes were done (heme negative and high risk). She has had iron infusions.   Opsumit was stopped in 9/16 due to suspicion for group 2 pulmonary hypertension only (low PVR) and she felt better off it.   She was admitted in 1/17 with increased dyspnea and was found to be profoundly anemic.  She had a total of 5 units PRBCs and an iron infusion in the hospital.  Stool was FOBT+, melena.  EGD, colonoscopy, and capsule endoscopy were all done without clear source of blood loss.  While in the hospital, she was noted to go into atrial  fibrillation with RVR.  She initially required amiodarone for rate control, but was transitioned over to Toprol XL. She was not anticoagulated due to concern for GI blood loss and anemia.  Anticoagulation was attempted later.   RHC in 3/17 showed elevated right heart filling pressures and mildly elevated PCWP.  There was mild pulmonary hypertension, likely pulmonary venous hypertension with PVR 1.9 WU.   She was admitted in 7/17 with symptomatic anemia, received 3 units PRBCs.  Anticoagulation was stopped.  Small bowel enteroscopy did not show a source of bleeding.    She was seen by Dr Rayann Heman, he recommended against Watchman. She is now off anticoagulation.  Getting Fe infusions.  Admitted 12/7 through 07/23/16. Treated for UTI, sepsisand volume overload. Discharged to [facility] ongoing rehab. Discharge weight  196 pounds.   She returns for follow up. Discharged from SNF on 08/09/16. Feels much better. Back home. Taking torsemide 60 bid. Weight stable 196. HHRN coming to house. Pending HHPT. Breathing ok. Only uses walker when she feels she needs it. No orthopnea or PND. No edema. HHRN measuring ankles and abdomen.  Labs 02/05/13 K 3.8 Creatinine 1.37 05/08/13 K 3.8, Creatinine 1.58 11/14 K 4.1, creatinine 1.82 08/09/2014: K 4.0 Creatinine 1.45  5/16: K 3.7, creatinine 1.76, BNP 447 6/16: K 5 => 4.6, creatinine 2.09 => 2.13, BNP 291 8/16: K 4.7, creatinine 2.1, HCT 30.7 9/16:  K 4.5, creatinine 2.3, HCT 34.1 1/17: K 4.8, creatinine 1.84 => 1.87, HCT 29.7, plts 136, FOBT+, BNP 403 2/17: K 3.5, creatinine 1.89, BNP 329, hgb 10 3/17: K 3.8, creatinine 2.5 => 2.3, HCT 35.4 => 33.5 5/17: K 3.7, creatinine 2.0, hgb 10.6 7/17: K 3.7, creatinine 2, hgb 9.9 8/17: K 3.8, creatinine 1.9, HCT 31.3 9/17: K 3.9, creatinine 2 07/23/2016: K 4.3 Creatinine 2.76  08/02/2016: K 4.7 Creatinine 2.0   PMH:  1) COPD       --former smoker (28 pk-yrs)       --on home O2 - 4L/min rest, up to 5L/min exertion.         --spirometry 6/13 in [place] = 0.85L FVC = 1.3L no DLCO measured. FEV1 in 2014 = 0.73        --CT chest 10/14 - moderate centrilobular emphysema. No pulm fibrosis but there was evidence for post-infectious/inflammatory scarring. Stable Asc Ao repair       --PFTs (11/14) FVC 46%, FEV1 0.8L (40%), ratio 87%, FEF 25-75% 0.44L DLCO 22%, TLC 50% => PFTs in 06/2013 did NOT suggest significant airflow obstruction according to Dr Gustavus Bryant last note despite "moderate emphysema" on CT.        --PFTs (6/16) were abnormal with severe obstruction, severe restriction, severely decreased DLCO.  Reviewed with Dr Melvyn Novas => he thinks obstruction is actually fairly minimal and that the restriction is due to body habitus.   2) Obesity 3) RA       --on plaquenil, sees Dr Lenna Gilford.  4) Type A aortic dissection s/p repair 2005     --c/b renal failure requiring HD x 3 months 5) PAH - previously treated in [place].  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      --Revatio stopped 10/14.     --V/Q scan 11/14 no chronic PE     -- 2015 Tracleer stopped and started on macitentan. 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      -- RHC 08/18/2014: No role for pulmonary vasodilators.  RA = 14 RV = 62/3/15 PA = 63/21 (38) PCW = mean 23 v waves to 40 Fick cardiac output/index = 6.3/3.2 Thermodilution CO/CI = 6.1/3.1, PVR = 2.0 WU, FA sat = 91% PA sat = 57%, 62%     -- RHC 3/17: mean RA 13, PA 44/2 mean 29, mean PCWP 17, CI 3.04, PVR 1.9 WU => pulmonary venous hypertension. 6) OSA 7) Diastolic CHF    --ECHO [id number] EF 55-60% RV mildly dilated with normal function. Mild to moderate posterior MR. RVSP 56. Probable restrictive diastolic filling pattern    --ECHO 9/15 EF 60-65% RV mildly dilated with normal function. Mild to moderate posterior MR. RVSP 40. Ao Root ok    --TEE 6/16 with EF 55-60%, D-shaped interventricular septum suggestive of RV pressure/volume overload, RV mildly dilated with normal systolic function,  peak RV-RA gradient 57 mmHg, very eccentric posteriorly-directed mitral regurgitation, possibly severe, etiology may be a very small area of prolapse on the anterior leaflet, s/p ascending aorta repair with residual dissection flap in the descending thoracic aorta.       --Echo (1/17) with EF 55-60%, mild to moderate MR, PASP 66 8) CKD 9) Mitral regurgitation: Possibly severe by 6/16 TEE, very eccentric.  Not good candidate for percutaneous MV clip (seen at [facility]).  Echo (1/17) with only mild to moderate MR.  10) Anemia: Suspect from chronic disease/renal disease and well as chronic GI bleeding.  EGD/colonoscopy/capsule endoscopy in  1/17 did not show any definite site for blood loss.  Small bowel enteroscopy in 7/17 did not show etiology of bleeding. 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  Getting Fe infusions.  11) Atrial fibrillation: Persistent, 1st noted in 1/17.  12) ECHO - EF 55-60% Grade II DD. Peak PA pressure 66 mmhg. Mild MR. RV mildly reduced.   ROS: All systems reviewed and negative except as per HPI.  Current Outpatient Prescriptions on File Prior to Encounter  Medication Sig Dispense Refill  . acetaminophen (TYLENOL) 325 MG tablet Take 2 tablets (650 mg total) by mouth every 6 (six) hours as needed for mild pain (or Fever >/= 101).    [name] albuterol (PROVENTIL) (2.5 MG/3ML) 0.083% nebulizer solution Take 3 mLs (2.5 mg total) by nebulization every 4 (four) hours as needed for wheezing or shortness of breath. 75 mL 12  . allopurinol (ZYLOPRIM) 300 MG tablet Take 300 mg by mouth daily.    [name] ALPRAZolam (XANAX) 0.25 MG tablet Take 1 tablet (0.25 mg total) by mouth every 12 (twelve) hours as needed for anxiety or sleep. 60 tablet 5  . atorvastatin (LIPITOR) 10 MG tablet TAKE 1 TABLET DAILY 90 tablet 2  . carboxymethylcellulose (REFRESH PLUS) 0.5 % SOLN Place 1 drop into both eyes at bedtime.    . hydroxychloroquine (PLAQUENIL) 200 MG tablet Take 200 mg by mouth 2 (two) times daily.    .  metoprolol succinate (TOPROL-XL) 25 MG 24 hr tablet Take 1 tablet (25 mg total) by mouth 2 (two) times daily.    [name] neomycin-polymyxin b-dexamethasone (MAXITROL) 3.5-10000-0.1 OINT Place 1 application into both eyes at bedtime.    . Omega-3 Fatty Acids (FISH OIL) 1200 MG CAPS Take 1,200 mg by mouth daily.    . OXYGEN Inhale 3-6 L into the lungs continuous.     . potassium chloride (K-DUR,KLOR-CON) 10 MEQ tablet Take 20-40 mEq by mouth 2 (two) times daily. 40 meq in the AM and 20 in the PM    . torsemide (DEMADEX) 20 MG tablet Take 3 tablets (60 mg total) by mouth 2 (two) times daily. 180 tablet 3   No current facility-administered medications on file prior to encounter.    Social History   Social History  . Marital status: Legally Separated    Spouse name: N/A  . Number of children: 1  . Years of education: N/A   Occupational History  . Disability     Office Work   Social History Main Topics  . Smoking status: Former Smoker    Packs/day: 1.50    Years: 14.00    Types: Cigarettes    Quit date: 08/09/2003  . Smokeless tobacco: Never Used  . Alcohol use 0.0 oz/week     Comment: 02/20/2016 "drank a little in my teens"  . Drug use: No  . Sexual activity: No   Other Topics Concern  . Not on file   Social History Narrative  . No narrative on file   Family History  Problem Relation Age of Onset  . Heart attack Mother   . Prostate cancer Father   . Diabetes Brother   . Kidney failure Brother     Vitals:   08/21/16 0926  BP: 102/70  BP Location: Left Arm  Patient Position: Sitting  Cuff Size: Large  Pulse: (!) 101  SpO2: (!) 80%  Weight: 200 lb 6.4 oz (90.9 kg)   Physical Exam Gen: Pleasant, well-nourished, in no distress, normal affect; on O2 by [place].   HEENT:  normal Neck: JVP 6-7 cm. Carotids 2+ with bilateral radiated bruits Lungs: Clear on 3 liters oxygen.   Cardiovascular: PMI normal. Irregular S1S2, no S3/S4, 2/6 SEM RSB. R and LLE trace edema  bilaterally. Extremities: No deformities, no cyanosis or clubbing.  Neuro: alert, non-focal. Cranial nerves intact. Moves all 4 extremities without difficulty Skin: Warm, no lesions .   Assessment/Plan: 1. Chronic diastolic CHF with pulmonary hypertension/RV failure:  NYHA III. Volume status stable. Continue torsemide to 60 mg twice a day.  Continue potassium 40 qam/20 qpm. Will get labs with Hematology next Monday   Referred to [facility] and tele monitoring with diuretic protocol.  2. Chronic respiratory failure:  She is on 3 liters at rest. 4-5 L with exertion.  home oxygen by [place]. She has COPD and restriction from her body habitus.  3. OSA: Continue CPAP nightly.  4. CKD: Stage III-IV. Creatinine baseline 2-2.2. This has been stable 5. Pulmonary hypertension: This is primarily [place] group 2 (pulmonary venous hypertension) based on RHC in 3/17.  She is not a good candidate for pulmonary vasodilators.   6 Mitral regurgitation:   recent echo in 07/2016 mild MR on ECHO.  7. Anemia: Suspect anemia of chronic disease/renal disease but also with component of GI blood loss.  She has had GI bleeding with FOBT+, but unable to visualized bleeding source on EGD, colonoscopy, enteroscopy, or capsule endoscopy (most recently in 7/17).  Seeing hematology (Dr. Irene Limbo), getting Aranesp and Fe infusions. Some concern now for possible myelodysplastic syndrome.  8. Atrial fibrillation: Persistent.  Rate is controlled.  CHADSVASC = 3.  She was evaluated for Watchman but decided not to be a good candidate. Due to recurrent severe GI bleeding, anticoagulation was stopped altogether. No stroke-like symptoms.  9. Deconditioning- Encouraged her to engage with HHPT 10. Aortic stenosis murmur - echo with mild sclerosis No significant stenosis   Glori Bickers MD 08/21/2016

## 2016-08-24 ENCOUNTER — Telehealth (HOSPITAL_COMMUNITY): Payer: Self-pay | Admitting: *Deleted

## 2016-08-24 NOTE — Telephone Encounter (Signed)
Cindy with [facility] called reporting patient's HR running between 54-57.  Patient's metoprolol was recently increased to 25 mg Twice daily.  I will send this to Dr. Aundra Dubin to review and set parameters for patient's HR.

## 2016-08-26 NOTE — Telephone Encounter (Signed)
That is ok 

## 2016-08-27 ENCOUNTER — Ambulatory Visit (HOSPITAL_BASED_OUTPATIENT_CLINIC_OR_DEPARTMENT_OTHER): Payer: Medicare Other | Admitting: Hematology

## 2016-08-27 ENCOUNTER — Other Ambulatory Visit: Payer: Self-pay | Admitting: *Deleted

## 2016-08-27 ENCOUNTER — Encounter: Payer: Self-pay | Admitting: Hematology

## 2016-08-27 ENCOUNTER — Other Ambulatory Visit (HOSPITAL_BASED_OUTPATIENT_CLINIC_OR_DEPARTMENT_OTHER): Payer: Medicare Other

## 2016-08-27 VITALS — BP 123/65 | HR 75 | Temp 98.1°F | Resp 18 | Ht 64.0 in | Wt 201.1 lb

## 2016-08-27 DIAGNOSIS — D631 Anemia in chronic kidney disease: Secondary | ICD-10-CM | POA: Diagnosis not present

## 2016-08-27 DIAGNOSIS — D696 Thrombocytopenia, unspecified: Secondary | ICD-10-CM

## 2016-08-27 DIAGNOSIS — N183 Chronic kidney disease, stage 3 unspecified: Secondary | ICD-10-CM

## 2016-08-27 DIAGNOSIS — D500 Iron deficiency anemia secondary to blood loss (chronic): Secondary | ICD-10-CM

## 2016-08-27 DIAGNOSIS — D469 Myelodysplastic syndrome, unspecified: Secondary | ICD-10-CM

## 2016-08-27 DIAGNOSIS — M069 Rheumatoid arthritis, unspecified: Secondary | ICD-10-CM | POA: Diagnosis not present

## 2016-08-27 DIAGNOSIS — D638 Anemia in other chronic diseases classified elsewhere: Secondary | ICD-10-CM | POA: Diagnosis not present

## 2016-08-27 DIAGNOSIS — I509 Heart failure, unspecified: Secondary | ICD-10-CM

## 2016-08-27 DIAGNOSIS — I272 Pulmonary hypertension, unspecified: Secondary | ICD-10-CM

## 2016-08-27 LAB — CBC & DIFF AND RETIC
BASO%: 0.4 % (ref 0.0–2.0)
Basophils Absolute: 0 10*3/uL (ref 0.0–0.1)
EOS ABS: 0.2 10*3/uL (ref 0.0–0.5)
EOS%: 2.7 % (ref 0.0–7.0)
HCT: 34.4 % — ABNORMAL LOW (ref 34.8–46.6)
HEMOGLOBIN: 10.5 g/dL — AB (ref 11.6–15.9)
Immature Retic Fract: 3.8 % (ref 1.60–10.00)
LYMPH%: 15.3 % (ref 14.0–49.7)
MCH: 30.2 pg (ref 25.1–34.0)
MCHC: 30.5 g/dL — AB (ref 31.5–36.0)
MCV: 98.9 fL (ref 79.5–101.0)
MONO#: 0.4 10*3/uL (ref 0.1–0.9)
MONO%: 6.2 % (ref 0.0–14.0)
NEUT%: 75.4 % (ref 38.4–76.8)
NEUTROS ABS: 4.3 10*3/uL (ref 1.5–6.5)
Platelets: 112 10*3/uL — ABNORMAL LOW (ref 145–400)
RBC: 3.48 10*6/uL — AB (ref 3.70–5.45)
RDW: 17.1 % — AB (ref 11.2–14.5)
RETIC %: 1.19 % (ref 0.70–2.10)
Retic Ct Abs: 41.41 10*3/uL (ref 33.70–90.70)
WBC: 5.6 10*3/uL (ref 3.9–10.3)
lymph#: 0.9 10*3/uL (ref 0.9–3.3)

## 2016-08-27 LAB — COMPREHENSIVE METABOLIC PANEL
ALK PHOS: 123 U/L (ref 40–150)
ALT: 10 U/L (ref 0–55)
AST: 16 U/L (ref 5–34)
Albumin: 3 g/dL — ABNORMAL LOW (ref 3.5–5.0)
Anion Gap: 9 mEq/L (ref 3–11)
BILIRUBIN TOTAL: 0.44 mg/dL (ref 0.20–1.20)
BUN: 49.2 mg/dL — ABNORMAL HIGH (ref 7.0–26.0)
CHLORIDE: 101 meq/L (ref 98–109)
CO2: 29 mEq/L (ref 22–29)
Calcium: 10.4 mg/dL (ref 8.4–10.4)
Creatinine: 2.8 mg/dL — ABNORMAL HIGH (ref 0.6–1.1)
EGFR: 20 mL/min/{1.73_m2} — ABNORMAL LOW (ref >90)
GLUCOSE: 112 mg/dL (ref 70–140)
POTASSIUM: 4.1 meq/L (ref 3.5–5.1)
SODIUM: 139 meq/L (ref 136–145)
Total Protein: 8.2 g/dL (ref 6.4–8.3)

## 2016-08-27 LAB — FERRITIN: FERRITIN: 270 ng/mL — AB (ref 9–269)

## 2016-08-27 MED ORDER — DARBEPOETIN ALFA 100 MCG/0.5ML IJ SOSY
50.0000 ug | PREFILLED_SYRINGE | Freq: Once | INTRAMUSCULAR | Status: AC
  Administered 2016-08-27: 50 ug via SUBCUTANEOUS
  Filled 2016-08-27: qty 0.5

## 2016-08-27 NOTE — Telephone Encounter (Signed)
Jenny Reichmann, RN aware she will set HR parameters to alert [place] if HR is 49 or lower or pt is symptomatic

## 2016-08-27 NOTE — Progress Notes (Signed)
[name]    HEMATOLOGY/ONCOLOGY CLINIC NOTE  Date of Service: .08/27/2016   Patient Care Team: Lawerance Cruel, MD as PCP - General (Family Medicine)  CHIEF COMPLAINTS/PURPOSE OF CONSULTATION: f/u for Anemia  Diagnosis: Normocytic Normochromic Anemia likely multifactorial - CKD/RA/GIB/MDS  Treatment: Aranesp q4weeks for Hgb <11 IV feraheme as needed to maintain ferritin >100.  HISTORY OF PRESENTING ILLNESS: Please see my initial consultation for details on initial presentation.  INTERVAL HISTORY  April Mueller is here for her scheduled follow-up for her anemia. Her hemoglobin today is 10.5 with a normal MCV. She was admitted to the hospital with an Escherichia coli sepsis due to urinary tract infection in early December 2017 that required ICU stay and aggressive inpatient management. She then required inpatient rehabilitation and has transitioned to home with home care's. Patient notes she feels better than she did when she was in the hospital. No evidence of overt bleeding. Platelets are somewhat lower likely related to antibiotics and her recent infection.  MEDICAL HISTORY:  Past Medical History:  Diagnosis Date  . Anemia 03/23/15   transfusion  . Anxiety   . Aortic aneurysm ([place])   . Arthritis    "qwhere" (02/20/2016)  . CHF (congestive heart failure) ([place])   . Chronic lower back pain   . CKD (chronic kidney disease) stage 3, GFR 30-59 ml/min    "on dialysis for 3 months after my aneurysm in 2005" (02/20/2016)  . COPD (chronic obstructive pulmonary disease) ([place])   . Gout   . Heart murmur   . History of blood transfusion "several"  . Hypertension   . Lupus   . On home oxygen therapy    "3L; 24/7" (02/20/2016)  . OSA on CPAP   . Pulmonary HTN   . Rheumatoid arthritis(714.0) 11/06/2012  . 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 ABDOMINAL AORTIC ANEURYSM REPAIR  2005  . BREAST LUMPECTOMY Right   . CARDIAC  CATHETERIZATION N/A 10/24/2015   Procedure: Right Heart Cath;  Surgeon: Larey Dresser, MD;  Location: [facility] CV LAB;  Service: Cardiovascular;  Laterality: N/A;  . CATARACT EXTRACTION W/ INTRAOCULAR LENS  IMPLANT, BILATERAL Bilateral 2016  . COLONOSCOPY N/A 08/19/2015   Procedure: COLONOSCOPY;  Surgeon: Jerene Bears, MD;  Location[facility] ENDOSCOPY;  Service: Endoscopy;  Laterality: N/A;  . ENTEROSCOPY N/A 02/22/2016   Procedure: ENTEROSCOPY;  Surgeon: Milus Banister, MD;  Location: [facility];  Service: Endoscopy;  Laterality: N/A;  . ESOPHAGOGASTRODUODENOSCOPY N/A 08/19/2015   Procedure: ESOPHAGOGASTRODUODENOSCOPY (EGD);  Surgeon: Jerene Bears, MD;  Location: [facility] ENDOSCOPY;  Service: Endoscopy;  Laterality: N/A;  patient scheduled, anesthesia aware of 1500 case, per [place].  08/18/15 DP  . GIVENS CAPSULE STUDY N/A 08/21/2015   Procedure: GIVENS CAPSULE STUDY;  Surgeon: Gatha Mayer, MD;  Location: [facility];  Service: Endoscopy;  Laterality: N/A;  . HEMORRHOID SURGERY    . RIGHT HEART CATHETERIZATION N/A 08/18/2014   Procedure: RIGHT HEART CATH;  Surgeon: Jolaine Artist, MD;  Location: [facility] CATH LAB;  Service: Cardiovascular;  Laterality: N/A;  . TEE WITHOUT CARDIOVERSION N/A 01/05/2015   Procedure: TRANSESOPHAGEAL ECHOCARDIOGRAM (TEE);  Surgeon: Larey Dresser, MD;  Location: [facility];  Service: Cardiovascular;  Laterality: N/A;  . WISDOM TOOTH EXTRACTION      SOCIAL HISTORY: Social History   Social History  . Marital status: Legally Separated    Spouse name: N/A  . Number of  children: 1  . Years of education: N/A   Occupational History  . Disability     Office Work   Social History Main Topics  . Smoking status: Former Smoker    Packs/day: 1.50    Years: 14.00    Types: Cigarettes    Quit date: 08/09/2003  . Smokeless tobacco: Never Used  . Alcohol use 0.0 oz/week     Comment: 02/20/2016 "drank a little in my teens"  . Drug use: No  . Sexual activity: No   Other  Topics Concern  . Not on file   Social History Narrative  . No narrative on file    FAMILY HISTORY: Family History  Problem Relation Age of Onset  . Heart attack Mother   . Prostate cancer Father   . Diabetes Brother   . Kidney failure Brother     ALLERGIES:  has No Known Allergies.  MEDICATIONS:  Current Outpatient Prescriptions  Medication Sig Dispense Refill  . acetaminophen (TYLENOL) 325 MG tablet Take 2 tablets (650 mg total) by mouth every 6 (six) hours as needed for mild pain (or Fever >/= 101).    [name] albuterol (PROVENTIL) (2.5 MG/3ML) 0.083% nebulizer solution Take 3 mLs (2.5 mg total) by nebulization every 4 (four) hours as needed for wheezing or shortness of breath. 75 mL 12  . allopurinol (ZYLOPRIM) 300 MG tablet Take 300 mg by mouth daily.    [name] ALPRAZolam (XANAX) 0.25 MG tablet Take 1 tablet (0.25 mg total) by mouth every 12 (twelve) hours as needed for anxiety or sleep. 60 tablet 5  . atorvastatin (LIPITOR) 10 MG tablet TAKE 1 TABLET DAILY 90 tablet 2  . carboxymethylcellulose (REFRESH PLUS) 0.5 % SOLN Place 1 drop into both eyes at bedtime.    . hydroxychloroquine (PLAQUENIL) 200 MG tablet Take 200 mg by mouth 2 (two) times daily.    . metoprolol succinate (TOPROL-XL) 25 MG 24 hr tablet Take 1 tablet (25 mg total) by mouth 2 (two) times daily.    [name] neomycin-polymyxin b-dexamethasone (MAXITROL) 3.5-10000-0.1 OINT Place 1 application into both eyes at bedtime.    . Omega-3 Fatty Acids (FISH OIL) 1200 MG CAPS Take 1,200 mg by mouth daily.    . OXYGEN Inhale 3-6 L into the lungs continuous.     . potassium chloride (K-DUR,KLOR-CON) 10 MEQ tablet Take 20-40 mEq by mouth 2 (two) times daily. 40 meq in the AM and 20 in the PM    . torsemide (DEMADEX) 20 MG tablet Take 3 tablets (60 mg total) by mouth 2 (two) times daily. 180 tablet 3   No current facility-administered medications for this visit.    Facility-Administered Medications Ordered in Other Visits  Medication Dose  Route Frequency Provider Last Rate Last Dose  . Darbepoetin Alfa (ARANESP) injection 50 mcg  50 mcg Subcutaneous Once Brunetta Genera, MD        REVIEW OF SYSTEMS:    10 Point review of Systems was done is negative except as noted above.  PHYSICAL EXAMINATION: ECOG PERFORMANCE STATUS: 3 - Symptomatic, >50% confined to bed  . Vitals:   08/27/16 1044  Weight: 201 lb 1.6 oz (91.2 kg)  Height: 5\' 4"  (1.626 m)   Filed Weights   08/27/16 1044  Weight: 201 lb 1.6 oz (91.2 kg)   .Body mass index is 34.52 kg/m.  GENERAL:alert, in no acute distress and comfortable on [place] oxygen SKIN: skin color, texture, turgor are normal, no rashes or significant lesions EYES: normal, conjunctiva  are pink and non-injected, sclera clear OROPHARYNX:no exudate, no erythema and lips, buccal mucosa, and tongue normal  NECK: supple, no JVD, thyroid normal size, non-tender, without nodularity LYMPH:  no palpable lymphadenopathy in the cervical, axillary or inguinal LUNGS: clear to auscultation with normal respiratory effort HEART: regular rate & rhythm,  no murmurs and no lower extremity edema ABDOMEN: abdomen soft, non-tender, normoactive bowel sounds  Musculoskeletal: no cyanosis of digits and no clubbing  PSYCH: alert & oriented x 3 with fluent speech NEURO: no focal motor/sensory deficits  LABORATORY DATA:  I have reviewed the data as listed . CBC Latest Ref Rng & Units 08/27/2016 07/30/2016 07/23/2016  WBC 3.9 - 10.3 10e3/uL 5.6 6.3 8.9  Hemoglobin 11.6 - 15.9 g/dL 10.5(L) 10.1(A) 10.2(L)  Hematocrit 34.8 - 46.6 % 34.4(L) 33(A) 32.8(L)  Platelets 145 - 400 10e3/uL 112(L) 122(A) 172   . CBC    Component Value Date/Time   WBC 5.6 08/27/2016 0945   WBC 8.9 07/23/2016 0507   RBC 3.48 (L) 08/27/2016 0945   RBC 3.48 (L) 07/23/2016 0507   HGB 10.5 (L) 08/27/2016 0945   HCT 34.4 (L) 08/27/2016 0945   PLT 112 (L) 08/27/2016 0945   MCV 98.9 08/27/2016 0945   MCH 30.2 08/27/2016 0945   MCH 29.3  07/23/2016 0507   MCHC 30.5 (L) 08/27/2016 0945   MCHC 31.1 07/23/2016 0507   RDW 17.1 (H) 08/27/2016 0945   LYMPHSABS 0.9 08/27/2016 0945   MONOABS 0.4 08/27/2016 0945   EOSABS 0.2 08/27/2016 0945   BASOSABS 0.0 08/27/2016 0945    . CMP Latest Ref Rng & Units 08/27/2016 08/02/2016 07/23/2016  Glucose 70 - 140 mg/dl 112 - 111(H)  BUN 7.0 - 26.0 mg/dL 49.2(H) 38(A) 55(H)  Creatinine 0.6 - 1.1 mg/dL 2.8(H) 2.0(A) 2.76(H)  Sodium 136 - 145 mEq/L 139 143 139  Potassium 3.5 - 5.1 mEq/L 4.1 4.7 4.3  Chloride 101 - 111 mmol/L - - 97(L)  CO2 22 - 29 mEq/L 29 - 31  Calcium 8.4 - 10.4 mg/dL 10.4 - 9.5  Total Protein 6.4 - 8.3 g/dL 8.2 - -  Total Bilirubin 0.20 - 1.20 mg/dL 0.44 - -  Alkaline Phos 40 - 150 U/L 123 - -  AST 5 - 34 U/L 16 - -  ALT 0 - 55 U/L 10 - -    RADIOGRAPHIC STUDIES: I have personally reviewed the radiological images as listed and agreed with the findings in the report. No results found.  ASSESSMENT & PLAN:   71 year old female with multiple medical comorbidities including hypertension, COPD, CHF, gout, rheumatoid arthritis, moderate mitral regurgitation with  #1 Normocytic Normochromic anemia.-Her anemia is likely multifactorial from her chronic kidney disease plus rheumatoid arthritis. Her allopurinol and plaquenil could certainly be an additional factors especially if there is worsening renal function. 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 Also has intermittent acute on chronic GI bleeds while on anticoagulation with her Eliquis with the last one being in July 2017 requiring 5 units of PRBCs. She remains off her anticoagulation.  SPEP with no monoclonal protein. No clinical evidence of splenomegaly to suggest overt hypersplenism. Hemoglobin stable today at 10.5 Ferritin levels from today --pending  #2 Mild thrombocytopenia -a little lower in the setting of recent sepsis (E.COli UTI) and antibiotic use -- will  monitor #2 rheumatoid arthritis - on plaquenil #3 chronic kidney disease #4 pulmonary hypertension #5 CHF  Plan --continue Aranesp q4weeks (hold for hgb>11) -rpt CBC in 4 weeks -  RTC with Dr Irene Limbo in 2 months with labs - we will f/u on ferritin level from today and determine need for additional IV Iron if ferritin <100 -Continue optimal treatment of rheumatoid arthritis -Continue follow-up with primary care physician for other medical cares.  RTC for labs/Aranesp shot in 4 weeks RTC with Dr Da Michelle in 8 weeks with labs  All of the patients questions were answered to her apparent satisfaction. The patient knows to call the clinic with any problems, questions or concerns.  I spent 20 minutes counseling the patient face to face. The total time spent in the appointment was 20 minutes and more than 50% was on counseling and direct patient cares and reviewed her extensive inpatient records and results    April Lone MD [place] AAHIVMS [facility] Hematology/Oncology Physician [facility]  (Office):       [phone] (Work cell):  [phone] (Fax):           [phone]

## 2016-08-31 ENCOUNTER — Encounter: Payer: Self-pay | Admitting: Hematology

## 2016-08-31 ENCOUNTER — Telehealth (HOSPITAL_COMMUNITY): Payer: Self-pay | Admitting: *Deleted

## 2016-08-31 NOTE — Telephone Encounter (Signed)
Cindy from [facility] called saying patient needed a letter from her MD for a higher toilet seat to be placed in her apartment.  I called her back and said she would need to call her PCP for this. No further questions t this time.

## 2016-09-07 ENCOUNTER — Telehealth: Payer: Self-pay | Admitting: Hematology

## 2016-09-07 ENCOUNTER — Telehealth (HOSPITAL_COMMUNITY): Payer: Self-pay

## 2016-09-07 NOTE — Telephone Encounter (Signed)
The Appointment on 2/19 will not work for April Mueller she needs to reschedule that day for any other day and she need First thing in the morning. She has to be home by 11am

## 2016-09-07 NOTE — Telephone Encounter (Signed)
Patient wants to know if her levels are ok does she have to come once a month.  I advised her to talk to the Dr about this.  Her next appointment is scheduled for 2/19

## 2016-09-07 NOTE — Telephone Encounter (Signed)
VO given for palliative care referral per Dr. Aundra Dubin as patient requested.  Renee Pain, RN

## 2016-09-07 NOTE — Telephone Encounter (Signed)
Returned call topt per telephone encounter to r/s lab/inj appt. Gave pt new appt date/time per request

## 2016-09-11 ENCOUNTER — Other Ambulatory Visit: Payer: Self-pay | Admitting: Family Medicine

## 2016-09-11 DIAGNOSIS — Z1231 Encounter for screening mammogram for malignant neoplasm of breast: Secondary | ICD-10-CM

## 2016-09-24 ENCOUNTER — Other Ambulatory Visit: Payer: Medicare Other

## 2016-09-24 ENCOUNTER — Ambulatory Visit: Payer: Medicare Other

## 2016-09-25 ENCOUNTER — Other Ambulatory Visit: Payer: Medicare Other

## 2016-09-25 ENCOUNTER — Ambulatory Visit: Payer: Medicare Other

## 2016-09-27 ENCOUNTER — Ambulatory Visit: Payer: Medicare Other | Admitting: Emergency Medicine

## 2016-10-01 ENCOUNTER — Ambulatory Visit (HOSPITAL_BASED_OUTPATIENT_CLINIC_OR_DEPARTMENT_OTHER): Payer: Medicare Other

## 2016-10-01 ENCOUNTER — Other Ambulatory Visit (HOSPITAL_BASED_OUTPATIENT_CLINIC_OR_DEPARTMENT_OTHER): Payer: Medicare Other

## 2016-10-01 VITALS — BP 116/74 | HR 67 | Temp 98.6°F | Resp 18

## 2016-10-01 DIAGNOSIS — D500 Iron deficiency anemia secondary to blood loss (chronic): Secondary | ICD-10-CM

## 2016-10-01 DIAGNOSIS — N183 Chronic kidney disease, stage 3 unspecified: Secondary | ICD-10-CM

## 2016-10-01 DIAGNOSIS — D469 Myelodysplastic syndrome, unspecified: Secondary | ICD-10-CM

## 2016-10-01 DIAGNOSIS — D638 Anemia in other chronic diseases classified elsewhere: Secondary | ICD-10-CM

## 2016-10-01 DIAGNOSIS — D631 Anemia in chronic kidney disease: Secondary | ICD-10-CM | POA: Diagnosis not present

## 2016-10-01 LAB — CBC & DIFF AND RETIC
BASO%: 0.4 % (ref 0.0–2.0)
Basophils Absolute: 0 10*3/uL (ref 0.0–0.1)
EOS%: 0 % (ref 0.0–7.0)
Eosinophils Absolute: 0 10*3/uL (ref 0.0–0.5)
HCT: 34.7 % — ABNORMAL LOW (ref 34.8–46.6)
HGB: 10.3 g/dL — ABNORMAL LOW (ref 11.6–15.9)
Immature Retic Fract: 5.1 % (ref 1.60–10.00)
LYMPH%: 15.9 % (ref 14.0–49.7)
MCH: 29.7 pg (ref 25.1–34.0)
MCHC: 29.7 g/dL — AB (ref 31.5–36.0)
MCV: 100 fL (ref 79.5–101.0)
MONO#: 0.5 10*3/uL (ref 0.1–0.9)
MONO%: 10 % (ref 0.0–14.0)
NEUT%: 73.7 % (ref 38.4–76.8)
NEUTROS ABS: 3.6 10*3/uL (ref 1.5–6.5)
Platelets: 97 10*3/uL — ABNORMAL LOW (ref 145–400)
RBC: 3.47 10*6/uL — AB (ref 3.70–5.45)
RDW: 16.8 % — ABNORMAL HIGH (ref 11.2–14.5)
Retic %: 1.2 % (ref 0.70–2.10)
Retic Ct Abs: 41.64 10*3/uL (ref 33.70–90.70)
WBC: 4.9 10*3/uL (ref 3.9–10.3)
lymph#: 0.8 10*3/uL — ABNORMAL LOW (ref 0.9–3.3)

## 2016-10-01 LAB — COMPREHENSIVE METABOLIC PANEL
ALK PHOS: 119 U/L (ref 40–150)
ALT: 11 U/L (ref 0–55)
AST: 15 U/L (ref 5–34)
Albumin: 3 g/dL — ABNORMAL LOW (ref 3.5–5.0)
Anion Gap: 8 mEq/L (ref 3–11)
BUN: 35.2 mg/dL — ABNORMAL HIGH (ref 7.0–26.0)
CHLORIDE: 104 meq/L (ref 98–109)
CO2: 30 meq/L — AB (ref 22–29)
Calcium: 10.3 mg/dL (ref 8.4–10.4)
Creatinine: 2.1 mg/dL — ABNORMAL HIGH (ref 0.6–1.1)
EGFR: 27 mL/min/{1.73_m2} — AB (ref >90)
GLUCOSE: 121 mg/dL (ref 70–140)
Potassium: 4 mEq/L (ref 3.5–5.1)
SODIUM: 142 meq/L (ref 136–145)
Total Bilirubin: 0.64 mg/dL (ref 0.20–1.20)
Total Protein: 8 g/dL (ref 6.4–8.3)

## 2016-10-01 LAB — FERRITIN: Ferritin: 223 ng/ml (ref 9–269)

## 2016-10-01 MED ORDER — DARBEPOETIN ALFA 100 MCG/0.5ML IJ SOSY
50.0000 ug | PREFILLED_SYRINGE | Freq: Once | INTRAMUSCULAR | Status: AC
  Administered 2016-10-01: 50 ug via SUBCUTANEOUS
  Filled 2016-10-01: qty 0.5

## 2016-10-01 NOTE — Patient Instructions (Signed)
Darbepoetin Alfa injection What is this medicine? DARBEPOETIN ALFA (dar be POE e tin AL fa) helps your body make more red blood cells. It is used to treat anemia caused by chronic kidney failure and chemotherapy. This medicine may be used for other purposes; ask your health care provider or pharmacist if you have questions. What should I tell my health care provider before I take this medicine? They need to know if you have any of these conditions: -blood clotting disorders or history of blood clots -cancer patient not on chemotherapy -cystic fibrosis -heart disease, such as angina, heart failure, or a history of a heart attack -hemoglobin level of 12 g/dL or greater -high blood pressure -low levels of folate, iron, or vitamin B12 -seizures -an unusual or allergic reaction to darbepoetin, erythropoietin, albumin, hamster proteins, latex, other medicines, foods, dyes, or preservatives -pregnant or trying to get pregnant -breast-feeding How should I use this medicine? This medicine is for injection into a vein or under the skin. It is usually given by a health care professional in a hospital or clinic setting. If you get this medicine at home, you will be taught how to prepare and give this medicine. Do not shake the solution before you withdraw a dose. Use exactly as directed. Take your medicine at regular intervals. Do not take your medicine more often than directed. It is important that you put your used needles and syringes in a special sharps container. Do not put them in a trash can. If you do not have a sharps container, call your pharmacist or healthcare provider to get one. Talk to your pediatrician regarding the use of this medicine in children. While this medicine may be used in children as young as 1 year for selected conditions, precautions do apply. Overdosage: If you think you have taken too much of this medicine contact a poison control center or emergency room at once. NOTE:  This medicine is only for you. Do not share this medicine with others. What if I miss a dose? If you miss a dose, take it as soon as you can. If it is almost time for your next dose, take only that dose. Do not take double or extra doses. What may interact with this medicine? Do not take this medicine with any of the following medications: -epoetin alfa This list may not describe all possible interactions. Give your health care provider a list of all the medicines, herbs, non-prescription drugs, or dietary supplements you use. Also tell them if you smoke, drink alcohol, or use illegal drugs. Some items may interact with your medicine. What should I watch for while using this medicine? Visit your prescriber or health care professional for regular checks on your progress and for the needed blood tests and blood pressure measurements. It is especially important for the doctor to make sure your hemoglobin level is in the desired range, to limit the risk of potential side effects and to give you the best benefit. Keep all appointments for any recommended tests. Check your blood pressure as directed. Ask your doctor what your blood pressure should be and when you should contact him or her. As your body makes more red blood cells, you may need to take iron, folic acid, or vitamin B supplements. Ask your doctor or health care provider which products are right for you. If you have kidney disease continue dietary restrictions, even though this medication can make you feel better. Talk with your doctor or health care professional about the   foods you eat and the vitamins that you take. What side effects may I notice from receiving this medicine? Side effects that you should report to your doctor or health care professional as soon as possible: -allergic reactions like skin rash, itching or hives, swelling of the face, lips, or tongue -breathing problems -changes in vision -chest pain -confusion, trouble speaking  or understanding -feeling faint or lightheaded, falls -high blood pressure -muscle aches or pains -pain, swelling, warmth in the leg -rapid weight gain -severe headaches -sudden numbness or weakness of the face, arm or leg -trouble walking, dizziness, loss of balance or coordination -seizures (convulsions) -swelling of the ankles, feet, hands -unusually weak or tired Side effects that usually do not require medical attention (report to your doctor or health care professional if they continue or are bothersome): -diarrhea -fever, chills (flu-like symptoms) -headaches -nausea, vomiting -redness, stinging, or swelling at site where injected This list may not describe all possible side effects. Call your doctor for medical advice about side effects. You may report side effects to FDA at 1-800-FDA-1088. Where should I keep my medicine? Keep out of the reach of children. Store in a refrigerator between 2 and 8 degrees C (36 and 46 degrees F). Do not freeze. Do not shake. Throw away any unused portion if using a single-dose vial. Throw away any unused medicine after the expiration date. NOTE: This sheet is a summary. It may not cover all possible information. If you have questions about this medicine, talk to your doctor, pharmacist, or health care provider.    2016, [employer]vier/[employer]. (2008-07-06 10:23:57)  

## 2016-10-13 ENCOUNTER — Encounter: Payer: Self-pay | Admitting: Hematology

## 2016-10-15 ENCOUNTER — Ambulatory Visit: Payer: Medicare Other | Admitting: Emergency Medicine

## 2016-10-16 ENCOUNTER — Telehealth: Payer: Self-pay | Admitting: Hematology

## 2016-10-16 NOTE — Telephone Encounter (Signed)
Patient called to say that the appoinment made for her on 3/14 will not work for her and she needs to reschedule it

## 2016-10-17 ENCOUNTER — Ambulatory Visit: Payer: Medicare Other

## 2016-10-17 ENCOUNTER — Ambulatory Visit: Payer: Medicare Other | Admitting: Hematology

## 2016-10-17 ENCOUNTER — Other Ambulatory Visit: Payer: Medicare Other

## 2016-10-18 ENCOUNTER — Encounter (HOSPITAL_COMMUNITY): Payer: Medicare Other

## 2016-10-22 ENCOUNTER — Telehealth (HOSPITAL_COMMUNITY): Payer: Self-pay | Admitting: Vascular Surgery

## 2016-10-22 ENCOUNTER — Other Ambulatory Visit: Payer: Medicare Other

## 2016-10-22 ENCOUNTER — Ambulatory Visit: Payer: Medicare Other | Admitting: Hematology

## 2016-10-22 ENCOUNTER — Ambulatory Visit: Payer: Medicare Other

## 2016-10-22 ENCOUNTER — Telehealth: Payer: Self-pay | Admitting: Hematology

## 2016-10-22 MED ORDER — METOPROLOL SUCCINATE ER 25 MG PO TB24: 25.0000 mg | ORAL_TABLET | Freq: Two times a day (BID) | ORAL | 3 refills | Status: DC

## 2016-10-22 MED ORDER — METOPROLOL SUCCINATE ER 25 MG PO TB24: 25.0000 mg | ORAL_TABLET | Freq: Two times a day (BID) | ORAL | 0 refills | Status: DC

## 2016-10-22 NOTE — Telephone Encounter (Signed)
GK covering AP - called patient to reschedule appointments - patient couldn't come 3/14 and wanted to push appointments back to April. Due to lab/inj q4w patient will come in 3/26 for lab/inj and then 4/23 for lab/GK/inj. Patient has both appointment dates/times - ok per desk nurse.

## 2016-10-22 NOTE — Telephone Encounter (Signed)
Pt called she states [facility] sent a fax last week for refill metoprolol 25 mg please advise pt is completly out

## 2016-10-24 ENCOUNTER — Ambulatory Visit: Payer: Medicare Other

## 2016-10-25 ENCOUNTER — Ambulatory Visit: Payer: Medicare Other

## 2016-10-29 ENCOUNTER — Ambulatory Visit: Payer: Medicare Other

## 2016-10-29 ENCOUNTER — Other Ambulatory Visit (HOSPITAL_BASED_OUTPATIENT_CLINIC_OR_DEPARTMENT_OTHER): Payer: Medicare Other

## 2016-10-29 DIAGNOSIS — D500 Iron deficiency anemia secondary to blood loss (chronic): Secondary | ICD-10-CM | POA: Diagnosis not present

## 2016-10-29 DIAGNOSIS — D638 Anemia in other chronic diseases classified elsewhere: Secondary | ICD-10-CM

## 2016-10-29 DIAGNOSIS — N183 Chronic kidney disease, stage 3 unspecified: Secondary | ICD-10-CM

## 2016-10-29 DIAGNOSIS — K922 Gastrointestinal hemorrhage, unspecified: Secondary | ICD-10-CM | POA: Diagnosis not present

## 2016-10-29 DIAGNOSIS — D469 Myelodysplastic syndrome, unspecified: Secondary | ICD-10-CM

## 2016-10-29 LAB — COMPREHENSIVE METABOLIC PANEL
ALBUMIN: 3.2 g/dL — AB (ref 3.5–5.0)
ALK PHOS: 135 U/L (ref 40–150)
ALT: 13 U/L (ref 0–55)
ANION GAP: 8 meq/L (ref 3–11)
AST: 18 U/L (ref 5–34)
BUN: 27 mg/dL — ABNORMAL HIGH (ref 7.0–26.0)
CHLORIDE: 104 meq/L (ref 98–109)
CO2: 32 mEq/L — ABNORMAL HIGH (ref 22–29)
Calcium: 10.9 mg/dL — ABNORMAL HIGH (ref 8.4–10.4)
Creatinine: 2.2 mg/dL — ABNORMAL HIGH (ref 0.6–1.1)
EGFR: 26 mL/min/{1.73_m2} — ABNORMAL LOW (ref >90)
Glucose: 120 mg/dl (ref 70–140)
POTASSIUM: 4 meq/L (ref 3.5–5.1)
Sodium: 144 mEq/L (ref 136–145)
Total Bilirubin: 0.7 mg/dL (ref 0.20–1.20)
Total Protein: 8.3 g/dL (ref 6.4–8.3)

## 2016-10-29 LAB — IRON AND TIBC
%SAT: 16 % — ABNORMAL LOW (ref 21–57)
IRON: 40 ug/dL — AB (ref 41–142)
TIBC: 244 ug/dL (ref 236–444)
UIBC: 204 ug/dL (ref 120–384)

## 2016-10-29 LAB — CBC & DIFF AND RETIC
BASO%: 0.5 % (ref 0.0–2.0)
Basophils Absolute: 0 10*3/uL (ref 0.0–0.1)
EOS ABS: 0.1 10*3/uL (ref 0.0–0.5)
EOS%: 1.2 % (ref 0.0–7.0)
HEMATOCRIT: 37.4 % (ref 34.8–46.6)
HEMOGLOBIN: 11.2 g/dL — AB (ref 11.6–15.9)
Immature Retic Fract: 5.2 % (ref 1.60–10.00)
LYMPH%: 20 % (ref 14.0–49.7)
MCH: 29.9 pg (ref 25.1–34.0)
MCHC: 29.9 g/dL — ABNORMAL LOW (ref 31.5–36.0)
MCV: 100 fL (ref 79.5–101.0)
MONO#: 0.3 10*3/uL (ref 0.1–0.9)
MONO%: 6.6 % (ref 0.0–14.0)
NEUT%: 71.7 % (ref 38.4–76.8)
NEUTROS ABS: 3 10*3/uL (ref 1.5–6.5)
NRBC: 0 % (ref 0–0)
PLATELETS: 95 10*3/uL — AB (ref 145–400)
RBC: 3.74 10*6/uL (ref 3.70–5.45)
RDW: 15.8 % — AB (ref 11.2–14.5)
Retic %: 1.09 % (ref 0.70–2.10)
Retic Ct Abs: 40.77 10*3/uL (ref 33.70–90.70)
WBC: 4.1 10*3/uL (ref 3.9–10.3)
lymph#: 0.8 10*3/uL — ABNORMAL LOW (ref 0.9–3.3)

## 2016-10-29 LAB — FERRITIN: FERRITIN: 213 ng/mL (ref 9–269)

## 2016-10-29 MED ORDER — DARBEPOETIN ALFA 100 MCG/0.5ML IJ SOSY: 50.0000 ug | PREFILLED_SYRINGE | Freq: Once | INTRAMUSCULAR | Status: DC

## 2016-10-29 NOTE — Progress Notes (Signed)
Hemoglobin noted at 11.2 today. No injection needed per protocol order. Pt given copy of current labs and schedule. Instructed to follow schedule and call office should issues occur. Pt verbalized understanding of instructions

## 2016-10-29 NOTE — Patient Instructions (Signed)
Darbepoetin Alfa injection What is this medicine? DARBEPOETIN ALFA (dar be POE e tin AL fa) helps your body make more red blood cells. It is used to treat anemia caused by chronic kidney failure and chemotherapy. This medicine may be used for other purposes; ask your health care provider or pharmacist if you have questions. What should I tell my health care provider before I take this medicine? They need to know if you have any of these conditions: -blood clotting disorders or history of blood clots -cancer patient not on chemotherapy -cystic fibrosis -heart disease, such as angina, heart failure, or a history of a heart attack -hemoglobin level of 12 g/dL or greater -high blood pressure -low levels of folate, iron, or vitamin B12 -seizures -an unusual or allergic reaction to darbepoetin, erythropoietin, albumin, hamster proteins, latex, other medicines, foods, dyes, or preservatives -pregnant or trying to get pregnant -breast-feeding How should I use this medicine? This medicine is for injection into a vein or under the skin. It is usually given by a health care professional in a hospital or clinic setting. If you get this medicine at home, you will be taught how to prepare and give this medicine. Do not shake the solution before you withdraw a dose. Use exactly as directed. Take your medicine at regular intervals. Do not take your medicine more often than directed. It is important that you put your used needles and syringes in a special sharps container. Do not put them in a trash can. If you do not have a sharps container, call your pharmacist or healthcare provider to get one. Talk to your pediatrician regarding the use of this medicine in children. While this medicine may be used in children as young as 1 year for selected conditions, precautions do apply. Overdosage: If you think you have taken too much of this medicine contact a poison control center or emergency room at once. NOTE:  This medicine is only for you. Do not share this medicine with others. What if I miss a dose? If you miss a dose, take it as soon as you can. If it is almost time for your next dose, take only that dose. Do not take double or extra doses. What may interact with this medicine? Do not take this medicine with any of the following medications: -epoetin alfa This list may not describe all possible interactions. Give your health care provider a list of all the medicines, herbs, non-prescription drugs, or dietary supplements you use. Also tell them if you smoke, drink alcohol, or use illegal drugs. Some items may interact with your medicine. What should I watch for while using this medicine? Visit your prescriber or health care professional for regular checks on your progress and for the needed blood tests and blood pressure measurements. It is especially important for the doctor to make sure your hemoglobin level is in the desired range, to limit the risk of potential side effects and to give you the best benefit. Keep all appointments for any recommended tests. Check your blood pressure as directed. Ask your doctor what your blood pressure should be and when you should contact him or her. As your body makes more red blood cells, you may need to take iron, folic acid, or vitamin B supplements. Ask your doctor or health care provider which products are right for you. If you have kidney disease continue dietary restrictions, even though this medication can make you feel better. Talk with your doctor or health care professional about the   foods you eat and the vitamins that you take. What side effects may I notice from receiving this medicine? Side effects that you should report to your doctor or health care professional as soon as possible: -allergic reactions like skin rash, itching or hives, swelling of the face, lips, or tongue -breathing problems -changes in vision -chest pain -confusion, trouble speaking  or understanding -feeling faint or lightheaded, falls -high blood pressure -muscle aches or pains -pain, swelling, warmth in the leg -rapid weight gain -severe headaches -sudden numbness or weakness of the face, arm or leg -trouble walking, dizziness, loss of balance or coordination -seizures (convulsions) -swelling of the ankles, feet, hands -unusually weak or tired Side effects that usually do not require medical attention (report to your doctor or health care professional if they continue or are bothersome): -diarrhea -fever, chills (flu-like symptoms) -headaches -nausea, vomiting -redness, stinging, or swelling at site where injected This list may not describe all possible side effects. Call your doctor for medical advice about side effects. You may report side effects to FDA at 1-800-FDA-1088. Where should I keep my medicine? Keep out of the reach of children. Store in a refrigerator between 2 and 8 degrees C (36 and 46 degrees F). Do not freeze. Do not shake. Throw away any unused portion if using a single-dose vial. Throw away any unused medicine after the expiration date. NOTE: This sheet is a summary. It may not cover all possible information. If you have questions about this medicine, talk to your doctor, pharmacist, or health care provider.    2016, [employer]vier/[employer]. (2008-07-06 10:23:57)  

## 2016-11-06 DIAGNOSIS — I480 Paroxysmal atrial fibrillation: Secondary | ICD-10-CM | POA: Insufficient documentation

## 2016-11-21 ENCOUNTER — Ambulatory Visit: Payer: Medicare Other

## 2016-11-22 ENCOUNTER — Ambulatory Visit
Admission: RE | Admit: 2016-11-22 | Discharge: 2016-11-22 | Disposition: A | Discharge status: Home / Self Care | Payer: Medicare Other | Source: Ambulatory Visit | Attending: Family Medicine | Admitting: Family Medicine

## 2016-11-22 DIAGNOSIS — Z1231 Encounter for screening mammogram for malignant neoplasm of breast: Secondary | ICD-10-CM

## 2016-11-26 ENCOUNTER — Ambulatory Visit (HOSPITAL_BASED_OUTPATIENT_CLINIC_OR_DEPARTMENT_OTHER): Payer: Medicare Other | Admitting: Hematology

## 2016-11-26 ENCOUNTER — Other Ambulatory Visit (HOSPITAL_BASED_OUTPATIENT_CLINIC_OR_DEPARTMENT_OTHER): Payer: Medicare Other

## 2016-11-26 ENCOUNTER — Ambulatory Visit: Payer: Medicare Other

## 2016-11-26 ENCOUNTER — Telehealth: Payer: Self-pay | Admitting: Hematology

## 2016-11-26 ENCOUNTER — Encounter: Payer: Self-pay | Admitting: Hematology

## 2016-11-26 VITALS — BP 118/41 | HR 6 | Temp 98.3°F | Resp 18 | Wt 198.2 lb

## 2016-11-26 DIAGNOSIS — N183 Chronic kidney disease, stage 3 (moderate): Secondary | ICD-10-CM | POA: Diagnosis not present

## 2016-11-26 DIAGNOSIS — D638 Anemia in other chronic diseases classified elsewhere: Secondary | ICD-10-CM | POA: Diagnosis not present

## 2016-11-26 DIAGNOSIS — M069 Rheumatoid arthritis, unspecified: Secondary | ICD-10-CM

## 2016-11-26 DIAGNOSIS — D696 Thrombocytopenia, unspecified: Secondary | ICD-10-CM

## 2016-11-26 DIAGNOSIS — I509 Heart failure, unspecified: Secondary | ICD-10-CM | POA: Diagnosis not present

## 2016-11-26 DIAGNOSIS — K922 Gastrointestinal hemorrhage, unspecified: Secondary | ICD-10-CM

## 2016-11-26 DIAGNOSIS — D500 Iron deficiency anemia secondary to blood loss (chronic): Secondary | ICD-10-CM

## 2016-11-26 DIAGNOSIS — D631 Anemia in chronic kidney disease: Secondary | ICD-10-CM

## 2016-11-26 DIAGNOSIS — I272 Pulmonary hypertension, unspecified: Secondary | ICD-10-CM

## 2016-11-26 DIAGNOSIS — D469 Myelodysplastic syndrome, unspecified: Secondary | ICD-10-CM

## 2016-11-26 LAB — CBC & DIFF AND RETIC
BASO%: 0.4 % (ref 0.0–2.0)
Basophils Absolute: 0 10*3/uL (ref 0.0–0.1)
EOS%: 1.7 % (ref 0.0–7.0)
Eosinophils Absolute: 0.1 10*3/uL (ref 0.0–0.5)
HCT: 35.4 % (ref 34.8–46.6)
HGB: 10.7 g/dL — ABNORMAL LOW (ref 11.6–15.9)
IMMATURE RETIC FRACT: 6.9 % (ref 1.60–10.00)
LYMPH#: 0.9 10*3/uL (ref 0.9–3.3)
LYMPH%: 19.5 % (ref 14.0–49.7)
MCH: 29.6 pg (ref 25.1–34.0)
MCHC: 30.2 g/dL — AB (ref 31.5–36.0)
MCV: 98.1 fL (ref 79.5–101.0)
MONO#: 0.4 10*3/uL (ref 0.1–0.9)
MONO%: 9.3 % (ref 0.0–14.0)
NEUT%: 69.1 % (ref 38.4–76.8)
NEUTROS ABS: 3.2 10*3/uL (ref 1.5–6.5)
PLATELETS: 92 10*3/uL — AB (ref 145–400)
RBC: 3.61 10*6/uL — AB (ref 3.70–5.45)
RDW: 15 % — ABNORMAL HIGH (ref 11.2–14.5)
RETIC %: 0.98 % (ref 0.70–2.10)
RETIC CT ABS: 35.38 10*3/uL (ref 33.70–90.70)
WBC: 4.6 10*3/uL (ref 3.9–10.3)
nRBC: 0 % (ref 0–0)

## 2016-11-26 LAB — COMPREHENSIVE METABOLIC PANEL
ALT: 14 U/L (ref 0–55)
AST: 18 U/L (ref 5–34)
Albumin: 3.1 g/dL — ABNORMAL LOW (ref 3.5–5.0)
Alkaline Phosphatase: 139 U/L (ref 40–150)
Anion Gap: 11 mEq/L (ref 3–11)
BILIRUBIN TOTAL: 0.56 mg/dL (ref 0.20–1.20)
BUN: 43.4 mg/dL — AB (ref 7.0–26.0)
CHLORIDE: 104 meq/L (ref 98–109)
CO2: 29 meq/L (ref 22–29)
CREATININE: 2.3 mg/dL — AB (ref 0.6–1.1)
Calcium: 10.5 mg/dL — ABNORMAL HIGH (ref 8.4–10.4)
EGFR: 24 mL/min/{1.73_m2} — ABNORMAL LOW (ref >90)
GLUCOSE: 115 mg/dL (ref 70–140)
Potassium: 4 mEq/L (ref 3.5–5.1)
SODIUM: 144 meq/L (ref 136–145)
TOTAL PROTEIN: 8 g/dL (ref 6.4–8.3)

## 2016-11-26 LAB — FERRITIN: Ferritin: 213 ng/ml (ref 9–269)

## 2016-11-26 NOTE — Telephone Encounter (Signed)
Scheduled appt per 4/23 los. - Patient is aware of appt date and time.

## 2016-11-26 NOTE — Progress Notes (Signed)
April Mueller Kitchen    HEMATOLOGY/ONCOLOGY CLINIC NOTE  Date of Service: .11/26/2016   Patient Care Team: April Cruel, MD as PCP - General (Family Medicine)  CHIEF COMPLAINTS/PURPOSE OF CONSULTATION: f/u for Anemia  Diagnosis: Normocytic Normochromic Anemia likely multifactorial - CKD/RA/GIB/MDS  Treatment: Aranesp q4weeks for Hgb <11 IV feraheme as needed to maintain ferritin >100.  HISTORY OF PRESENTING ILLNESS: Please see my initial consultation for details on initial presentation.  INTERVAL HISTORY  [name] is here for her scheduled follow-up for her anemia. Her hemoglobin today is 10.7 with a normal MCV. She notes no acute new symptoms and feels well overall. Her hemoglobin as close to 11 and she prefers holding off on her Aranesp shot today which is quite reasonable. She has had no acute bleeding issues since being off the Eliquis.  MEDICAL HISTORY:  Past Medical History:  Diagnosis Date  . Anemia 03/23/15   transfusion  . Anxiety   . Aortic aneurysm ([place])   . Arthritis    "qwhere" (02/20/2016)  . CHF (congestive heart failure) ([place])   . Chronic lower back pain   . CKD (chronic kidney disease) stage 3, GFR 30-59 ml/min    "on dialysis for 3 months after my aneurysm in 2005" (02/20/2016)  . COPD (chronic obstructive pulmonary disease) ([place])   . Gout   . Heart murmur   . History of blood transfusion "several"  . Hypertension   . Lupus   . On home oxygen therapy    "3L; 24/7" (02/20/2016)  . OSA on CPAP   . Pulmonary HTN ([place])   . Rheumatoid arthritis(714.0) 11/06/2012  . 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 ABDOMINAL AORTIC ANEURYSM REPAIR  2005  . BREAST EXCISIONAL BIOPSY Right    benign  . BREAST LUMPECTOMY Right   . CARDIAC CATHETERIZATION N/A 10/24/2015   Procedure: Right Heart Cath;  Surgeon: Larey Dresser, MD;  Location: [facility];  Service: Cardiovascular;  Laterality: N/A;  .  CATARACT EXTRACTION W/ INTRAOCULAR LENS  IMPLANT, BILATERAL Bilateral 2016  . COLONOSCOPY N/A 08/19/2015   Procedure: COLONOSCOPY;  Surgeon: Jerene Bears, MD;  Location[facility] ENDOSCOPY;  Service: Endoscopy;  Laterality: N/A;  . ENTEROSCOPY N/A 02/22/2016   Procedure: ENTEROSCOPY;  Surgeon: Milus Banister, MD;  Location: [facility];  Service: Endoscopy;  Laterality: N/A;  . ESOPHAGOGASTRODUODENOSCOPY N/A 08/19/2015   Procedure: ESOPHAGOGASTRODUODENOSCOPY (EGD);  Surgeon: Jerene Bears, MD;  Location: [facility] ENDOSCOPY;  Service: Endoscopy;  Laterality: N/A;  patient scheduled, anesthesia aware of 1500 case, per April Mueller.  08/18/15 DP  . GIVENS CAPSULE STUDY N/A 08/21/2015   Procedure: GIVENS CAPSULE STUDY;  Surgeon: Gatha Mayer, MD;  Location: [facility];  Service: Endoscopy;  Laterality: N/A;  . HEMORRHOID SURGERY    . RIGHT HEART CATHETERIZATION N/A 08/18/2014   Procedure: RIGHT HEART CATH;  Surgeon: Jolaine Artist, MD;  Location[facility] CATH LAB;  Service: Cardiovascular;  Laterality: N/A;  . TEE WITHOUT CARDIOVERSION N/A 01/05/2015   Procedure: TRANSESOPHAGEAL ECHOCARDIOGRAM (TEE);  Surgeon: Larey Dresser, MD;  Location: [facility];  Service: Cardiovascular;  Laterality: N/A;  . WISDOM TOOTH EXTRACTION      SOCIAL HISTORY: Social History   Social History  . Marital status: Legally Separated    Spouse name: N/A  . Number of children: 1  . Years of education: N/A   Occupational History  . Disability  Office Work   Social History Main Topics  . Smoking status: Former Smoker    Packs/day: 1.50    Years: 14.00    Types: Cigarettes    Quit date: 08/09/2003  . Smokeless tobacco: Never Used  . Alcohol use 0.0 oz/week     Comment: 02/20/2016 "drank a little in my teens"  . Drug use: No  . Sexual activity: No   Other Topics Concern  . Not on file   Social History Narrative  . No narrative on file    FAMILY HISTORY: Family History  Problem Relation Age of Onset  . Heart attack  Mother   . Prostate cancer Father   . Diabetes Brother   . Kidney failure Brother   . Breast cancer Maternal Aunt     ALLERGIES:  has No Known Allergies.  MEDICATIONS:  Current Outpatient Prescriptions  Medication Sig Dispense Refill  . acetaminophen (TYLENOL) 325 MG tablet Take 2 tablets (650 mg total) by mouth every 6 (six) hours as needed for mild pain (or Fever >/= 101).    April Mueller Kitchen allopurinol (ZYLOPRIM) 300 MG tablet Take 300 mg by mouth daily.    April Mueller Kitchen ALPRAZolam (XANAX) 0.25 MG tablet Take 1 tablet (0.25 mg total) by mouth every 12 (twelve) hours as needed for anxiety or sleep. 60 tablet 5  . atorvastatin (LIPITOR) 10 MG tablet TAKE 1 TABLET DAILY 90 tablet 2  . carboxymethylcellulose (REFRESH PLUS) 0.5 % SOLN Place 1 drop into both eyes at bedtime.    . hydroxychloroquine (PLAQUENIL) 200 MG tablet Take 200 mg by mouth 2 (two) times daily.    . metoprolol succinate (TOPROL-XL) 25 MG 24 hr tablet Take 1 tablet (25 mg total) by mouth 2 (two) times daily. 10 tablet 0  . neomycin-polymyxin b-dexamethasone (MAXITROL) 3.5-10000-0.1 OINT Place 1 application into both eyes at bedtime.    . Omega-3 Fatty Acids (FISH OIL) 1200 MG CAPS Take 1,200 mg by mouth daily.    . OXYGEN Inhale 3-6 L into the lungs continuous.     . potassium chloride (K-DUR,KLOR-CON) 10 MEQ tablet Take 20-40 mEq by mouth 2 (two) times daily. 40 meq in the AM and 20 in the PM    . torsemide (DEMADEX) 20 MG tablet Take 3 tablets (60 mg total) by mouth 2 (two) times daily. 180 tablet 3   No current facility-administered medications for this visit.     REVIEW OF SYSTEMS:    10 Point review of Systems was done is negative except as noted above.  PHYSICAL EXAMINATION: ECOG PERFORMANCE STATUS: 3 - Symptomatic, >50% confined to bed  . Vitals:   11/26/16 0907  Weight: 198 lb 4 oz (89.9 kg)   Filed Weights   11/26/16 0907  Weight: 198 lb 4 oz (89.9 kg)   .Body mass index is 34.03 kg/m.  GENERAL:alert, in no acute  distress and comfortable on [place] oxygen SKIN: skin color, texture, turgor are normal, no rashes or significant lesions EYES: normal, conjunctiva are pink and non-injected, sclera clear OROPHARYNX:no exudate, no erythema and lips, buccal mucosa, and tongue normal  NECK: supple, no JVD, thyroid normal size, non-tender, without nodularity LYMPH:  no palpable lymphadenopathy in the cervical, axillary or inguinal LUNGS: clear to auscultation with normal respiratory effort HEART: regular rate & rhythm,  no murmurs and no lower extremity edema ABDOMEN: abdomen soft, non-tender, normoactive bowel sounds  Musculoskeletal: no cyanosis of digits and no clubbing  PSYCH: alert & oriented x 3 with fluent speech NEURO: no  focal motor/sensory deficits  LABORATORY DATA:  I have reviewed the data as listed . CBC Latest Ref Rng & Units 11/26/2016 10/29/2016 10/01/2016  WBC 3.9 - 10.3 10e3/uL 4.6 4.1 4.9  Hemoglobin 11.6 - 15.9 g/dL 10.7(L) 11.2(L) 10.3(L)  Hematocrit 34.8 - 46.6 % 35.4 37.4 34.7(L)  Platelets 145 - 400 10e3/uL 92(L) 95(L) 97(L)   . CBC    Component Value Date/Time   WBC 4.6 11/26/2016 0852   WBC 8.9 07/23/2016 0507   RBC 3.61 (L) 11/26/2016 0852   RBC 3.48 (L) 07/23/2016 0507   HGB 10.7 (L) 11/26/2016 0852   HCT 35.4 11/26/2016 0852   PLT 92 (L) 11/26/2016 0852   MCV 98.1 11/26/2016 0852   MCH 29.6 11/26/2016 0852   MCH 29.3 07/23/2016 0507   MCHC 30.2 (L) 11/26/2016 0852   MCHC 31.1 07/23/2016 0507   RDW 15.0 (H) 11/26/2016 0852   LYMPHSABS 0.9 11/26/2016 0852   MONOABS 0.4 11/26/2016 0852   EOSABS 0.1 11/26/2016 0852   BASOSABS 0.0 11/26/2016 0852    . CMP Latest Ref Rng & Units 11/26/2016 10/29/2016 10/01/2016  Glucose 70 - 140 mg/dl 115 120 121  BUN 7.0 - 26.0 mg/dL 43.4(H) 27.0(H) 35.2(H)  Creatinine 0.6 - 1.1 mg/dL 2.3(H) 2.2(H) 2.1(H)  Sodium 136 - 145 mEq/L 144 144 142  Potassium 3.5 - 5.1 mEq/L 4.0 4.0 4.0  Chloride 101 - 111 mmol/L - - -  CO2 22 - 29 mEq/L 29  32(H) 30(H)  Calcium 8.4 - 10.4 mg/dL 10.5(H) 10.9(H) 10.3  Total Protein 6.4 - 8.3 g/dL 8.0 8.3 8.0  Total Bilirubin 0.20 - 1.20 mg/dL 0.56 0.70 0.64  Alkaline Phos 40 - 150 U/L 139 135 119  AST 5 - 34 U/L 18 18 15   ALT 0 - 55 U/L 14 13 11     RADIOGRAPHIC STUDIES: I have personally reviewed the radiological images as listed and agreed with the findings in the report. Mm Screening Breast Tomo Bilateral  Result Date: 11/22/2016 CLINICAL DATA:  Screening. EXAM: 2D DIGITAL SCREENING BILATERAL MAMMOGRAM WITH CAD AND ADJUNCT TOMO COMPARISON:  Previous exam(s). ACR Breast Density Category b: There are scattered areas of fibroglandular density. FINDINGS: There are no findings suspicious for malignancy. Images were processed with CAD. IMPRESSION: No mammographic evidence of malignancy. A result letter of this screening mammogram will be mailed directly to the patient. RECOMMENDATION: Screening mammogram in one year. (Code:SM-B-01Y) BI-RADS CATEGORY  1: Negative. Electronically Signed   By: Ammie Ferrier M.D.   On: 11/22/2016 13:56    ASSESSMENT & PLAN:   71 year old female with multiple medical comorbidities including hypertension, COPD, CHF, gout, rheumatoid arthritis, moderate mitral regurgitation with  #1 Normocytic Normochromic anemia.-Her anemia is likely multifactorial from her chronic kidney disease plus rheumatoid arthritis. Her allopurinol and plaquenil could certainly be an additional factors especially if there is worsening renal function. 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 Also has intermittent acute on chronic GI bleeds while on anticoagulation with her Eliquis with the last one being in July 2017 requiring 5 units of PRBCs. She remains off her anticoagulation.  SPEP with no monoclonal protein. No clinical evidence of splenomegaly to suggest overt hypersplenism. Hemoglobin stable today at 10.7 Ferritin levels from today  --pending  #2 Mild thrombocytopenia -a little lower in the setting of recent sepsis (E.COli UTI) and antibiotic use -- will monitor #2 rheumatoid arthritis - on plaquenil #3 chronic kidney disease #4 pulmonary hypertension #5 CHF  Plan --continue Aranesp  q4weeks (hold for hgb>11). Patient prefers to hold off on those today which is quite reasonable . Continue CBC in 4 weeks with Aranesp as needed - we will f/u on ferritin level from today and determine need for additional IV Iron if ferritin <100 -Continue optimal treatment of rheumatoid arthritis -Continue follow-up with primary care physician for other medical cares.  RTC for labs/Aranesp shot q 4 weeks RTC with Dr Irene Limbo in 3 months with labs  All of the patients questions were answered to her apparent satisfaction. The patient knows to call the clinic with any problems, questions or concerns.  I spent 20 minutes counseling the patient face to face. The total time spent in the appointment was 20 minutes and more than 50% was on counseling and direct patient cares and reviewed her extensive inpatient records and results    Sullivan Lone MD [place] [facility]AHIV[facility] Hematology/Oncology Physician [facility] [place]  (Office):       [phone] (Work cell):  [phone] (Fax):           [phone]

## 2016-11-26 NOTE — Progress Notes (Signed)
Injection of Aranesp cancelled today by Dr. Irene Limbo

## 2016-11-27 ENCOUNTER — Ambulatory Visit (HOSPITAL_COMMUNITY)
Admission: RE | Admit: 2016-11-27 | Discharge: 2016-11-27 | Disposition: A | Discharge status: Home / Self Care | Payer: Medicare Other | Source: Ambulatory Visit | Attending: Cardiology | Admitting: Cardiology

## 2016-11-27 ENCOUNTER — Encounter (HOSPITAL_COMMUNITY): Payer: Self-pay

## 2016-11-27 VITALS — BP 120/71 | HR 62 | Wt 197.8 lb

## 2016-11-27 DIAGNOSIS — I481 Persistent atrial fibrillation: Secondary | ICD-10-CM | POA: Insufficient documentation

## 2016-11-27 DIAGNOSIS — I350 Nonrheumatic aortic (valve) stenosis: Secondary | ICD-10-CM | POA: Insufficient documentation

## 2016-11-27 DIAGNOSIS — M069 Rheumatoid arthritis, unspecified: Secondary | ICD-10-CM | POA: Diagnosis not present

## 2016-11-27 DIAGNOSIS — N183 Chronic kidney disease, stage 3 unspecified: Secondary | ICD-10-CM

## 2016-11-27 DIAGNOSIS — Z9981 Dependence on supplemental oxygen: Secondary | ICD-10-CM | POA: Insufficient documentation

## 2016-11-27 DIAGNOSIS — D649 Anemia, unspecified: Secondary | ICD-10-CM | POA: Insufficient documentation

## 2016-11-27 DIAGNOSIS — N184 Chronic kidney disease, stage 4 (severe): Secondary | ICD-10-CM | POA: Insufficient documentation

## 2016-11-27 DIAGNOSIS — I130 Hypertensive heart and chronic kidney disease with heart failure and stage 1 through stage 4 chronic kidney disease, or unspecified chronic kidney disease: Secondary | ICD-10-CM | POA: Diagnosis not present

## 2016-11-27 DIAGNOSIS — I4819 Other persistent atrial fibrillation: Secondary | ICD-10-CM

## 2016-11-27 DIAGNOSIS — I340 Nonrheumatic mitral (valve) insufficiency: Secondary | ICD-10-CM | POA: Insufficient documentation

## 2016-11-27 DIAGNOSIS — I5032 Chronic diastolic (congestive) heart failure: Secondary | ICD-10-CM | POA: Diagnosis not present

## 2016-11-27 DIAGNOSIS — J9611 Chronic respiratory failure with hypoxia: Secondary | ICD-10-CM

## 2016-11-27 DIAGNOSIS — J9610 Chronic respiratory failure, unspecified whether with hypoxia or hypercapnia: Secondary | ICD-10-CM | POA: Insufficient documentation

## 2016-11-27 DIAGNOSIS — J449 Chronic obstructive pulmonary disease, unspecified: Secondary | ICD-10-CM | POA: Diagnosis not present

## 2016-11-27 DIAGNOSIS — Z87891 Personal history of nicotine dependence: Secondary | ICD-10-CM | POA: Diagnosis not present

## 2016-11-27 DIAGNOSIS — G4733 Obstructive sleep apnea (adult) (pediatric): Secondary | ICD-10-CM | POA: Insufficient documentation

## 2016-11-27 DIAGNOSIS — I272 Pulmonary hypertension, unspecified: Secondary | ICD-10-CM

## 2016-11-27 NOTE — Progress Notes (Signed)
Patient ID: April Mueller, female   DOB: Sep 19, 1945, 71 y.o.   MRN: [id number]   HPI  PCP: Dr Harrington Challenger Pulmonary: Dr Byrum/Dr Melvyn Novas  Cardiology: Dr. Aundra Dubin  April Mueller is a 71 yo with COPD on home oxygen, rheumatoid arthritis, chronic atrial fibrillation, chronic diastolic CHF, CKD.    Moved back from [place] and saw Dr. Lamonte Sakai. In [place] was followed by cardiology and pulmonary. Started on Tracleer in 2006. Revatio was added, however was discontinued without any change in her symptoms. Stopped smoking in January 2005 when she had aortic aneurysm repair. Smoked 1.5-2 ppd x 15 - 20 years.  Later on macitentan.  I had her do a TEE in 6/16.  She had a loud MR murmur.  TEE showed very eccentric, posteriorly-directed MR that may be from subtle prolapse of the anterior mitral leaflet.  I am concerned that the MR could be severe but very difficult to fully visualize.  She additionally had PFTs done that were abnormal, but according to her pulmonologist Melvyn Novas), the obstruction was really only minimal and the restriction was primarily related to body habitus.  He does not think that we can explain her hypoxemia and dyspnea primarily from parenchymal lung pathology.  I sent her to [facility] for evaluation for percutaneous mitral valve clip.  They did not think that she was a good candidate.   She has been anemic and received 1 unit PRBCs in 8/16.  She saw GI and was noted to be heme negative, no scopes were done (heme negative and high risk). She has had iron infusions.   Opsumit was stopped in 9/16 due to suspicion for group 2 pulmonary hypertension only (low PVR) and she felt better off it.   She was admitted in 1/17 with increased dyspnea and was found to be profoundly anemic.  She had a total of 5 units PRBCs and an iron infusion in the hospital.  Stool was FOBT+, melena.  EGD, colonoscopy, and capsule endoscopy were all done without clear source of blood loss.  While in the hospital, she was noted to go into atrial  fibrillation with RVR.  She initially required amiodarone for rate control, but was transitioned over to Toprol XL. She was not anticoagulated due to concern for GI blood loss and anemia.  Anticoagulation was attempted later.   RHC in 3/17 showed elevated right heart filling pressures and mildly elevated PCWP.  There was mild pulmonary hypertension, likely pulmonary venous hypertension with PVR 1.9 WU.   She was admitted in 7/17 with symptomatic anemia, received 3 units PRBCs.  Anticoagulation was stopped.  Small bowel enteroscopy did not show a source of bleeding.    She was seen by Dr Rayann Heman, he recommended against Watchman. She is now off anticoagulation.  Has had Fe infusions and getting Aranesp.   Admitted 12/7 through 07/23/16. Treated for UTI, sepsisand volume overload. Discharged to [facility] ongoing rehab. Discharge weight  196 pounds.   She returns for follow up.  Feeling good overall.  Has not been admitted recently.  Fatigues with long walks but generally no dyspnea walking on flat ground.  No chest pain.  Continues to use home oxygen.  No orthopnea/PND.  Weight down 3 lbs.  No BRBPR or melena.   Labs 02/05/13 K 3.8 Creatinine 1.37 05/08/13 K 3.8, Creatinine 1.58 11/14 K 4.1, creatinine 1.82 08/09/2014: K 4.0 Creatinine 1.45  5/16: K 3.7, creatinine 1.76, BNP 447 6/16: K 5 => 4.6, creatinine 2.09 => 2.13, BNP 291 8/16:  K 4.7, creatinine 2.1, HCT 30.7 9/16: K 4.5, creatinine 2.3, HCT 34.1 1/17: K 4.8, creatinine 1.84 => 1.87, HCT 29.7, plts 136, FOBT+, BNP 403 2/17: K 3.5, creatinine 1.89, BNP 329, hgb 10 3/17: K 3.8, creatinine 2.5 => 2.3, HCT 35.4 => 33.5 5/17: K 3.7, creatinine 2.0, hgb 10.6 7/17: K 3.7, creatinine 2, hgb 9.9 8/17: K 3.8, creatinine 1.9, HCT 31.3 9/17: K 3.9, creatinine 2 07/23/2016: K 4.3 Creatinine 2.76  08/02/2016: K 4.7 Creatinine 2.0  4/18: K 4, creatinine 2.3, hgb 10.7  1) COPD       --former smoker (28 pk-yrs)       --on home O2 - 4L/min rest,  up to 5L/min exertion.        --spirometry 6/13 in [place] = 0.85L FVC = 1.3L no DLCO measured. FEV1 in 2014 = 0.73        --CT chest 10/14 - moderate centrilobular emphysema. No pulm fibrosis but there was evidence for post-infectious/inflammatory scarring. Stable Asc Ao repair       --PFTs (11/14) FVC 46%, FEV1 0.8L (40%), ratio 87%, FEF 25-75% 0.44L DLCO 22%, TLC 50% => PFTs in 06/2013 did NOT suggest significant airflow obstruction according to Dr Gustavus Bryant last note despite "moderate emphysema" on CT.        --PFTs (6/16) were abnormal with severe obstruction, severe restriction, severely decreased DLCO.  Reviewed with Dr Melvyn Novas => he thinks obstruction is actually fairly minimal and that the restriction is due to body habitus.   2) Obesity 3) RA       --on plaquenil, sees Dr Lenna Gilford.  4) Type A aortic dissection s/p repair 2005     --c/b renal failure requiring HD x 3 months 5) PAH - previously treated in [place].  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      --on macitentan (revatio stopped 10/14).     --V/Q scan 11/14 no chronic PE     -- 2015 Tracleer stopped and started on macitentan. 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      -- RHC 08/18/2014: No role for pulmonary vasodilators.  RA = 14 RV = 62/3/15 PA = 63/21 (38) PCW = mean 23 v waves to 40 Fick cardiac output/index = 6.3/3.2 Thermodilution CO/CI = 6.1/3.1, PVR = 2.0 WU, FA sat = 91% PA sat = 57%, 62%     -- RHC 3/17: mean RA 13, PA 44/2 mean 29, mean PCWP 17, CI 3.04, PVR 1.9 WU => pulmonary venous hypertension. 6) OSA 7) Diastolic CHF    --ECHO 4/27 EF 55-60% RV mildly dilated with normal function. Mild to moderate posterior MR. RVSP 56. Probable restrictive diastolic filling pattern    --ECHO 9/15 EF 60-65% RV mildly dilated with normal function. Mild to moderate posterior MR. RVSP 40. Ao Root ok    --TEE 6/16 with EF 55-60%, D-shaped interventricular septum suggestive of RV pressure/volume overload, RV mildly  dilated with normal systolic function, peak RV-RA gradient 57 mmHg, very eccentric posteriorly-directed mitral regurgitation, possibly severe, etiology may be a very small area of prolapse on the anterior leaflet, s/p ascending aorta repair with residual dissection flap in the descending thoracic aorta.       --Echo (1/17) with EF 55-60%, mild to moderate MR, PASP 66     --Echo (12/17) with EF 55-60%, grade II diastolic dysfunction, mild MR, mildly decreased RV systolic function, PASP 66 mmHg.  8) CKD 9) Mitral regurgitation: Possibly severe by 6/16 TEE, very eccentric.  Not good candidate for percutaneous MV clip (seen at [facility]).  Echo (1/17) with only mild to moderate MR.  10) Anemia: Suspect from chronic disease/renal disease and well as chronic GI bleeding.  EGD/colonoscopy/capsule endoscopy in 1/17 did not show any definite site for blood loss.  Small bowel enteroscopy in 7/17 did not show etiology of bleeding. 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  Getting Fe infusions.  11) Atrial fibrillation: Persistent, 1st noted in 1/17.  ROS: All systems reviewed and negative except as per HPI.  Current Outpatient Prescriptions on File Prior to Encounter  Medication Sig Dispense Refill  . acetaminophen (TYLENOL) 325 MG tablet Take 2 tablets (650 mg total) by mouth every 6 (six) hours as needed for mild pain (or Fever >/= 101).    [name] allopurinol (ZYLOPRIM) 300 MG tablet Take 300 mg by mouth daily.    [name] ALPRAZolam (XANAX) 0.25 MG tablet Take 1 tablet (0.25 mg total) by mouth every 12 (twelve) hours as needed for anxiety or sleep. 60 tablet 5  . atorvastatin (LIPITOR) 10 MG tablet TAKE 1 TABLET DAILY 90 tablet 2  . carboxymethylcellulose (REFRESH PLUS) 0.5 % SOLN Place 1 drop into both eyes at bedtime.    . hydroxychloroquine (PLAQUENIL) 200 MG tablet Take 200 mg by mouth 2 (two) times daily.    . metoprolol succinate (TOPROL-XL) 25 MG 24 hr tablet Take 1 tablet (25 mg total) by mouth 2 (two) times daily. 10  tablet 0  . neomycin-polymyxin b-dexamethasone (MAXITROL) 3.5-10000-0.1 OINT Place 1 application into both eyes at bedtime.    . Omega-3 Fatty Acids (FISH OIL) 1200 MG CAPS Take 1,200 mg by mouth daily.    . OXYGEN Inhale 3-6 L into the lungs continuous.     . potassium chloride (K-DUR,KLOR-CON) 10 MEQ tablet Take 20-40 mEq by mouth 2 (two) times daily. 40 meq in the AM and 20 in the PM    . torsemide (DEMADEX) 20 MG tablet Take 3 tablets (60 mg total) by mouth 2 (two) times daily. 180 tablet 3   No current facility-administered medications on file prior to encounter.    Social History   Social History  . Marital status: Legally Separated    Spouse name: N/A  . Number of children: 1  . Years of education: N/A   Occupational History  . Disability     Office Work   Social History Main Topics  . Smoking status: Former Smoker    Packs/day: 1.50    Years: 14.00    Types: Cigarettes    Quit date: 08/09/2003  . Smokeless tobacco: Never Used  . Alcohol use 0.0 oz/week     Comment: 02/20/2016 "drank a little in my teens"  . Drug use: No  . Sexual activity: No   Other Topics Concern  . None   Social History Narrative  . None   Family History  Problem Relation Age of Onset  . Heart attack Mother   . Prostate cancer Father   . Diabetes Brother   . Kidney failure Brother   . Breast cancer Maternal Aunt     Vitals:   11/27/16 0859  BP: 120/71  Pulse: 62  SpO2: 91%  Weight: 197 lb 12 oz (89.7 kg)   Physical Exam Gen: Pleasant, well-nourished, in no distress, normal affect; on O2 by [place].   HEENT: normal Neck: JVP 8-9 cm. Carotids 2+ with bilateral radiated bruits Lungs: Clear on 3 liters oxygen.   Cardiovascular: PMI normal. Irregular S1S2, no S3/S4, 2/6  SEM RUSB. 1+ ankle edema.  Extremities: No deformities, no cyanosis or clubbing.  Neuro: alert, non-focal. Cranial nerves intact. Moves all 4 extremities without difficulty Skin: Warm, no lesions .   Assessment/Plan: 1.  Chronic diastolic CHF with pulmonary hypertension/RV failure: NYHA II-III. Probably mild volume overload but weight is down and she is feeling good.  - Continue torsemide to 60 mg twice a day.  - Continue potassium 40 qam/20 qpm. - Recent BMET ok.   2. Chronic respiratory failure:  She is on 3 liters at rest. 4-5 L with exertion.  home oxygen by [place]. She has COPD and restriction from her body habitus (OHS).  3. OSA: Continue CPAP nightly.  4. CKD: Stage III-IV. Creatinine baseline 2-2.3. This has been stable 5. Pulmonary hypertension: This is primarily [place] group 2 (pulmonary venous hypertension) based on RHC in 3/17.  She is not a good candidate for pulmonary vasodilators.   6 Mitral regurgitation:  Recent echo in 07/2016 mild MR.  7. Anemia: Suspect anemia of chronic disease/renal disease but also with component of GI blood loss.  She has had GI bleeding with FOBT+, but unable to visualized bleeding source on EGD, colonoscopy, enteroscopy, or capsule endoscopy (most recently in 7/17).  Seeing hematology (Dr. Irene Limbo), getting Aranesp and Fe infusions.  8. Atrial fibrillation: Persistent.  Rate is controlled.  CHADSVASC = 3.  She was evaluated for Watchman but decided not to be a good candidate. Due to recurrent severe GI bleeding, anticoagulation was stopped altogether. No stroke-like symptoms.  9. Aortic stenosis murmur: No significant stenosis on echo.   Followup in 3 months.   [name] MD 11/27/2016

## 2016-11-27 NOTE — Patient Instructions (Signed)
We will contact you in 3 months to schedule your next appointment.  

## 2016-12-24 ENCOUNTER — Other Ambulatory Visit (HOSPITAL_BASED_OUTPATIENT_CLINIC_OR_DEPARTMENT_OTHER): Payer: Medicare Other

## 2016-12-24 ENCOUNTER — Ambulatory Visit: Payer: Medicare Other

## 2016-12-24 DIAGNOSIS — D469 Myelodysplastic syndrome, unspecified: Secondary | ICD-10-CM

## 2016-12-24 DIAGNOSIS — K922 Gastrointestinal hemorrhage, unspecified: Secondary | ICD-10-CM

## 2016-12-24 DIAGNOSIS — D638 Anemia in other chronic diseases classified elsewhere: Secondary | ICD-10-CM

## 2016-12-24 DIAGNOSIS — N183 Chronic kidney disease, stage 3 unspecified: Secondary | ICD-10-CM

## 2016-12-24 DIAGNOSIS — D500 Iron deficiency anemia secondary to blood loss (chronic): Secondary | ICD-10-CM

## 2016-12-24 LAB — FERRITIN: FERRITIN: 202 ng/mL (ref 9–269)

## 2016-12-24 LAB — COMPREHENSIVE METABOLIC PANEL
ALK PHOS: 134 U/L (ref 40–150)
ALT: 13 U/L (ref 0–55)
AST: 16 U/L (ref 5–34)
Albumin: 3 g/dL — ABNORMAL LOW (ref 3.5–5.0)
Anion Gap: 10 mEq/L (ref 3–11)
BUN: 36.9 mg/dL — ABNORMAL HIGH (ref 7.0–26.0)
CHLORIDE: 105 meq/L (ref 98–109)
CO2: 29 mEq/L (ref 22–29)
Calcium: 10.5 mg/dL — ABNORMAL HIGH (ref 8.4–10.4)
Creatinine: 2.3 mg/dL — ABNORMAL HIGH (ref 0.6–1.1)
EGFR: 25 mL/min/{1.73_m2} — AB (ref >90)
GLUCOSE: 130 mg/dL (ref 70–140)
POTASSIUM: 4.2 meq/L (ref 3.5–5.1)
SODIUM: 145 meq/L (ref 136–145)
Total Bilirubin: 0.64 mg/dL (ref 0.20–1.20)
Total Protein: 8 g/dL (ref 6.4–8.3)

## 2016-12-24 LAB — CBC & DIFF AND RETIC
BASO%: 0.5 % (ref 0.0–2.0)
Basophils Absolute: 0 10*3/uL (ref 0.0–0.1)
EOS%: 2.1 % (ref 0.0–7.0)
Eosinophils Absolute: 0.1 10*3/uL (ref 0.0–0.5)
HCT: 35.4 % (ref 34.8–46.6)
HGB: 10.5 g/dL — ABNORMAL LOW (ref 11.6–15.9)
Immature Retic Fract: 2 % (ref 1.60–10.00)
LYMPH%: 22.4 % (ref 14.0–49.7)
MCH: 29.6 pg (ref 25.1–34.0)
MCHC: 29.7 g/dL — AB (ref 31.5–36.0)
MCV: 99.7 fL (ref 79.5–101.0)
MONO#: 0.4 10*3/uL (ref 0.1–0.9)
MONO%: 9.2 % (ref 0.0–14.0)
NEUT%: 65.8 % (ref 38.4–76.8)
NEUTROS ABS: 2.5 10*3/uL (ref 1.5–6.5)
Platelets: 79 10*3/uL — ABNORMAL LOW (ref 145–400)
RBC: 3.55 10*6/uL — ABNORMAL LOW (ref 3.70–5.45)
RDW: 15.6 % — AB (ref 11.2–14.5)
Retic %: 0.79 % (ref 0.70–2.10)
Retic Ct Abs: 28.05 10*3/uL — ABNORMAL LOW (ref 33.70–90.70)
WBC: 3.8 10*3/uL — AB (ref 3.9–10.3)
lymph#: 0.9 10*3/uL (ref 0.9–3.3)

## 2016-12-24 MED ORDER — DARBEPOETIN ALFA 60 MCG/0.3ML IJ SOSY: 50.0000 ug | PREFILLED_SYRINGE | Freq: Once | INTRAMUSCULAR | Status: DC

## 2016-12-24 MED ORDER — DARBEPOETIN ALFA 100 MCG/0.5ML IJ SOSY
50.0000 ug | PREFILLED_SYRINGE | Freq: Once | INTRAMUSCULAR | Status: DC
  Filled 2016-12-24: qty 0.5

## 2016-12-24 NOTE — Patient Instructions (Signed)
Darbepoetin Alfa injection What is this medicine? DARBEPOETIN ALFA (dar be POE e tin AL fa) helps your body make more red blood cells. It is used to treat anemia caused by chronic kidney failure and chemotherapy. This medicine may be used for other purposes; ask your health care provider or pharmacist if you have questions. What should I tell my health care provider before I take this medicine? They need to know if you have any of these conditions: -blood clotting disorders or history of blood clots -cancer patient not on chemotherapy -cystic fibrosis -heart disease, such as angina, heart failure, or a history of a heart attack -hemoglobin level of 12 g/dL or greater -high blood pressure -low levels of folate, iron, or vitamin B12 -seizures -an unusual or allergic reaction to darbepoetin, erythropoietin, albumin, hamster proteins, latex, other medicines, foods, dyes, or preservatives -pregnant or trying to get pregnant -breast-feeding How should I use this medicine? This medicine is for injection into a vein or under the skin. It is usually given by a health care professional in a hospital or clinic setting. If you get this medicine at home, you will be taught how to prepare and give this medicine. Do not shake the solution before you withdraw a dose. Use exactly as directed. Take your medicine at regular intervals. Do not take your medicine more often than directed. It is important that you put your used needles and syringes in a special sharps container. Do not put them in a trash can. If you do not have a sharps container, call your pharmacist or healthcare provider to get one. Talk to your pediatrician regarding the use of this medicine in children. While this medicine may be used in children as young as 1 year for selected conditions, precautions do apply. Overdosage: If you think you have taken too much of this medicine contact a poison control center or emergency room at once. NOTE:  This medicine is only for you. Do not share this medicine with others. What if I miss a dose? If you miss a dose, take it as soon as you can. If it is almost time for your next dose, take only that dose. Do not take double or extra doses. What may interact with this medicine? Do not take this medicine with any of the following medications: -epoetin alfa This list may not describe all possible interactions. Give your health care provider a list of all the medicines, herbs, non-prescription drugs, or dietary supplements you use. Also tell them if you smoke, drink alcohol, or use illegal drugs. Some items may interact with your medicine. What should I watch for while using this medicine? Visit your prescriber or health care professional for regular checks on your progress and for the needed blood tests and blood pressure measurements. It is especially important for the doctor to make sure your hemoglobin level is in the desired range, to limit the risk of potential side effects and to give you the best benefit. Keep all appointments for any recommended tests. Check your blood pressure as directed. Ask your doctor what your blood pressure should be and when you should contact him or her. As your body makes more red blood cells, you may need to take iron, folic acid, or vitamin B supplements. Ask your doctor or health care provider which products are right for you. If you have kidney disease continue dietary restrictions, even though this medication can make you feel better. Talk with your doctor or health care professional about the   foods you eat and the vitamins that you take. What side effects may I notice from receiving this medicine? Side effects that you should report to your doctor or health care professional as soon as possible: -allergic reactions like skin rash, itching or hives, swelling of the face, lips, or tongue -breathing problems -changes in vision -chest pain -confusion, trouble speaking  or understanding -feeling faint or lightheaded, falls -high blood pressure -muscle aches or pains -pain, swelling, warmth in the leg -rapid weight gain -severe headaches -sudden numbness or weakness of the face, arm or leg -trouble walking, dizziness, loss of balance or coordination -seizures (convulsions) -swelling of the ankles, feet, hands -unusually weak or tired Side effects that usually do not require medical attention (report to your doctor or health care professional if they continue or are bothersome): -diarrhea -fever, chills (flu-like symptoms) -headaches -nausea, vomiting -redness, stinging, or swelling at site where injected This list may not describe all possible side effects. Call your doctor for medical advice about side effects. You may report side effects to FDA at 1-800-FDA-1088. Where should I keep my medicine? Keep out of the reach of children. Store in a refrigerator between 2 and 8 degrees C (36 and 46 degrees F). Do not freeze. Do not shake. Throw away any unused portion if using a single-dose vial. Throw away any unused medicine after the expiration date. NOTE: This sheet is a summary. It may not cover all possible information. If you have questions about this medicine, talk to your doctor, pharmacist, or health care provider.    2016, [employer]vier/[employer]. (2008-07-06 10:23:57)  

## 2016-12-24 NOTE — Progress Notes (Signed)
Pt did not injection today. Stated Dr. Irene Limbo left the decision up to her if her Hemoglobin is greater than 10.0

## 2017-01-21 ENCOUNTER — Ambulatory Visit: Payer: Medicare Other

## 2017-01-21 ENCOUNTER — Other Ambulatory Visit (HOSPITAL_BASED_OUTPATIENT_CLINIC_OR_DEPARTMENT_OTHER): Payer: Medicare Other

## 2017-01-21 ENCOUNTER — Other Ambulatory Visit: Payer: Medicare Other

## 2017-01-21 DIAGNOSIS — K922 Gastrointestinal hemorrhage, unspecified: Secondary | ICD-10-CM

## 2017-01-21 DIAGNOSIS — D638 Anemia in other chronic diseases classified elsewhere: Secondary | ICD-10-CM

## 2017-01-21 DIAGNOSIS — D500 Iron deficiency anemia secondary to blood loss (chronic): Secondary | ICD-10-CM

## 2017-01-21 LAB — CBC & DIFF AND RETIC
BASO%: 1.1 % (ref 0.0–2.0)
Basophils Absolute: 0 10*3/uL (ref 0.0–0.1)
EOS ABS: 0 10*3/uL (ref 0.0–0.5)
EOS%: 0.6 % (ref 0.0–7.0)
HCT: 33.9 % — ABNORMAL LOW (ref 34.8–46.6)
HEMOGLOBIN: 10.5 g/dL — AB (ref 11.6–15.9)
IMMATURE RETIC FRACT: 3.8 % (ref 1.60–10.00)
LYMPH#: 0.8 10*3/uL — AB (ref 0.9–3.3)
LYMPH%: 19.6 % (ref 14.0–49.7)
MCH: 29.8 pg (ref 25.1–34.0)
MCHC: 31.1 g/dL — ABNORMAL LOW (ref 31.5–36.0)
MCV: 95.8 fL (ref 79.5–101.0)
MONO#: 0.5 10*3/uL (ref 0.1–0.9)
MONO%: 10.9 % (ref 0.0–14.0)
NEUT%: 67.8 % (ref 38.4–76.8)
NEUTROS ABS: 2.9 10*3/uL (ref 1.5–6.5)
Platelets: 75 10*3/uL — ABNORMAL LOW (ref 145–400)
RBC: 3.54 10*6/uL — ABNORMAL LOW (ref 3.70–5.45)
RDW: 16.2 % — ABNORMAL HIGH (ref 11.2–14.5)
RETIC %: 0.98 % (ref 0.70–2.10)
RETIC CT ABS: 34.69 10*3/uL (ref 33.70–90.70)
WBC: 4.3 10*3/uL (ref 3.9–10.3)

## 2017-01-21 LAB — COMPREHENSIVE METABOLIC PANEL
ALBUMIN: 3 g/dL — AB (ref 3.5–5.0)
ALK PHOS: 138 U/L (ref 40–150)
ALT: 13 U/L (ref 0–55)
AST: 18 U/L (ref 5–34)
Anion Gap: 9 mEq/L (ref 3–11)
BILIRUBIN TOTAL: 0.52 mg/dL (ref 0.20–1.20)
BUN: 41.4 mg/dL — AB (ref 7.0–26.0)
CO2: 27 meq/L (ref 22–29)
Calcium: 10.3 mg/dL (ref 8.4–10.4)
Chloride: 105 mEq/L (ref 98–109)
Creatinine: 2.3 mg/dL — ABNORMAL HIGH (ref 0.6–1.1)
EGFR: 24 mL/min/{1.73_m2} — ABNORMAL LOW (ref >90)
GLUCOSE: 112 mg/dL (ref 70–140)
Potassium: 4.2 mEq/L (ref 3.5–5.1)
SODIUM: 141 meq/L (ref 136–145)
TOTAL PROTEIN: 7.7 g/dL (ref 6.4–8.3)

## 2017-01-21 LAB — FERRITIN: Ferritin: 193 ng/ml (ref 9–269)

## 2017-01-21 NOTE — Progress Notes (Signed)
Hemoglobin noted at 10.5 today. Pt declined to taken Aranesp injection. Stated that she would call office if she felt as if her hemoglobin was low. Copy of current labs and schedule given to patient.

## 2017-01-29 ENCOUNTER — Other Ambulatory Visit (HOSPITAL_COMMUNITY): Payer: Self-pay | Admitting: Cardiology

## 2017-01-29 DIAGNOSIS — I158 Other secondary hypertension: Secondary | ICD-10-CM

## 2017-02-18 ENCOUNTER — Ambulatory Visit: Payer: Medicare Other

## 2017-02-18 ENCOUNTER — Other Ambulatory Visit: Payer: Medicare Other

## 2017-02-18 ENCOUNTER — Ambulatory Visit: Payer: Medicare Other | Admitting: Hematology and Oncology

## 2017-02-18 ENCOUNTER — Ambulatory Visit: Payer: Medicare Other | Admitting: Hematology

## 2017-03-11 ENCOUNTER — Encounter: Payer: Self-pay | Admitting: Hematology

## 2017-03-11 ENCOUNTER — Other Ambulatory Visit (HOSPITAL_BASED_OUTPATIENT_CLINIC_OR_DEPARTMENT_OTHER): Payer: Medicare Other

## 2017-03-11 ENCOUNTER — Ambulatory Visit (HOSPITAL_BASED_OUTPATIENT_CLINIC_OR_DEPARTMENT_OTHER): Payer: Medicare Other | Admitting: Hematology

## 2017-03-11 ENCOUNTER — Ambulatory Visit: Payer: Medicare Other

## 2017-03-11 VITALS — BP 109/45 | HR 54 | Temp 98.7°F | Resp 18 | Ht 64.0 in | Wt 193.1 lb

## 2017-03-11 DIAGNOSIS — D649 Anemia, unspecified: Secondary | ICD-10-CM | POA: Diagnosis not present

## 2017-03-11 DIAGNOSIS — M069 Rheumatoid arthritis, unspecified: Secondary | ICD-10-CM | POA: Diagnosis not present

## 2017-03-11 DIAGNOSIS — I272 Pulmonary hypertension, unspecified: Secondary | ICD-10-CM | POA: Diagnosis not present

## 2017-03-11 DIAGNOSIS — N189 Chronic kidney disease, unspecified: Secondary | ICD-10-CM | POA: Diagnosis not present

## 2017-03-11 DIAGNOSIS — D696 Thrombocytopenia, unspecified: Secondary | ICD-10-CM | POA: Diagnosis not present

## 2017-03-11 DIAGNOSIS — D638 Anemia in other chronic diseases classified elsewhere: Secondary | ICD-10-CM

## 2017-03-11 DIAGNOSIS — I509 Heart failure, unspecified: Secondary | ICD-10-CM

## 2017-03-11 DIAGNOSIS — D469 Myelodysplastic syndrome, unspecified: Secondary | ICD-10-CM

## 2017-03-11 DIAGNOSIS — D500 Iron deficiency anemia secondary to blood loss (chronic): Secondary | ICD-10-CM

## 2017-03-11 DIAGNOSIS — K922 Gastrointestinal hemorrhage, unspecified: Secondary | ICD-10-CM

## 2017-03-11 LAB — COMPREHENSIVE METABOLIC PANEL
ALBUMIN: 3.1 g/dL — AB (ref 3.5–5.0)
ALK PHOS: 133 U/L (ref 40–150)
ALT: 18 U/L (ref 0–55)
ANION GAP: 8 meq/L (ref 3–11)
AST: 22 U/L (ref 5–34)
BILIRUBIN TOTAL: 0.66 mg/dL (ref 0.20–1.20)
BUN: 41.1 mg/dL — ABNORMAL HIGH (ref 7.0–26.0)
CALCIUM: 10.3 mg/dL (ref 8.4–10.4)
CO2: 30 meq/L — AB (ref 22–29)
CREATININE: 2.5 mg/dL — AB (ref 0.6–1.1)
Chloride: 106 mEq/L (ref 98–109)
EGFR: 22 mL/min/{1.73_m2} — AB (ref >90)
Glucose: 118 mg/dl (ref 70–140)
Potassium: 3.8 mEq/L (ref 3.5–5.1)
Sodium: 143 mEq/L (ref 136–145)
TOTAL PROTEIN: 7.5 g/dL (ref 6.4–8.3)

## 2017-03-11 LAB — CBC & DIFF AND RETIC
BASO%: 0.2 % (ref 0.0–2.0)
BASOS ABS: 0 10*3/uL (ref 0.0–0.1)
EOS ABS: 0 10*3/uL (ref 0.0–0.5)
EOS%: 0.2 % (ref 0.0–7.0)
HCT: 34.7 % — ABNORMAL LOW (ref 34.8–46.6)
HEMOGLOBIN: 10.4 g/dL — AB (ref 11.6–15.9)
IMMATURE RETIC FRACT: 1.5 % — AB (ref 1.60–10.00)
LYMPH%: 20.5 % (ref 14.0–49.7)
MCH: 29.7 pg (ref 25.1–34.0)
MCHC: 30 g/dL — ABNORMAL LOW (ref 31.5–36.0)
MCV: 99.1 fL (ref 79.5–101.0)
MONO#: 0.4 10*3/uL (ref 0.1–0.9)
MONO%: 8.9 % (ref 0.0–14.0)
NEUT%: 70.2 % (ref 38.4–76.8)
NEUTROS ABS: 3.4 10*3/uL (ref 1.5–6.5)
PLATELETS: 83 10*3/uL — AB (ref 145–400)
RBC: 3.5 10*6/uL — AB (ref 3.70–5.45)
RDW: 16 % — ABNORMAL HIGH (ref 11.2–14.5)
RETIC CT ABS: 37.1 10*3/uL (ref 33.70–90.70)
Retic %: 1.06 % (ref 0.70–2.10)
WBC: 4.8 10*3/uL (ref 3.9–10.3)
lymph#: 1 10*3/uL (ref 0.9–3.3)
nRBC: 0 % (ref 0–0)

## 2017-03-11 LAB — IRON AND TIBC
%SAT: 22 % (ref 21–57)
IRON: 56 ug/dL (ref 41–142)
TIBC: 255 ug/dL (ref 236–444)
UIBC: 199 ug/dL (ref 120–384)

## 2017-03-11 LAB — FERRITIN: FERRITIN: 206 ng/mL (ref 9–269)

## 2017-03-11 NOTE — Progress Notes (Signed)
April Mueller    HEMATOLOGY/ONCOLOGY CLINIC NOTE  Date of Service: .03/11/2017   Patient Care Team: Lawerance Cruel, MD as PCP - General (Family Medicine)  CHIEF COMPLAINTS/PURPOSE OF CONSULTATION: f/u for Anemia  Diagnosis: Normocytic Normochromic Anemia likely multifactorial - CKD/RA/GIB/MDS  Treatment: Aranesp q4weeks for Hgb <11 IV feraheme as needed to maintain ferritin >100.  HISTORY OF PRESENTING ILLNESS: Please see my initial consultation for details on initial presentation.  INTERVAL HISTORY  [name] is here for her scheduled follow-up for her anemia. Her hemoglobin today is 10.4 with a normal MCV. She notes no acute new symptoms and feels well overall. Her  She has not received Aranesp for the last 3 months and repeat hemoglobin has been fairly stable. She has had no acute/overt bleeding issues since being off the Eliquis. She prefers to space out her follow-ups which is quite reasonable and wants to hold off on further Aranesp shots. " I'm seeing too many doctors on the co-pays are getting too much"   MEDICAL HISTORY:  Past Medical History:  Diagnosis Date  . Anemia 03/23/15   transfusion  . Anxiety   . Aortic aneurysm ([place])   . Arthritis    "qwhere" (02/20/2016)  . CHF (congestive heart failure) ([place])   . Chronic lower back pain   . CKD (chronic kidney disease) stage 3, GFR 30-59 ml/min    "on dialysis for 3 months after my aneurysm in 2005" (02/20/2016)  . COPD (chronic obstructive pulmonary disease) ([place])   . Gout   . Heart murmur   . History of blood transfusion "several"  . Hypertension   . Lupus   . On home oxygen therapy    "3L; 24/7" (02/20/2016)  . OSA on CPAP   . Pulmonary HTN ([place])   . Rheumatoid arthritis(714.0) 11/06/2012  . 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 ABDOMINAL AORTIC ANEURYSM REPAIR  2005  . BREAST EXCISIONAL BIOPSY Right    benign  . BREAST LUMPECTOMY Right   .  CARDIAC CATHETERIZATION N/A 10/24/2015   Procedure: Right Heart Cath;  Surgeon: Larey Dresser, MD;  Location: [place] CV LAB;  Service: Cardiovascular;  Laterality: N/A;  . CATARACT EXTRACTION W/ INTRAOCULAR LENS  IMPLANT, BILATERAL Bilateral 2016  . COLONOSCOPY N/A 08/19/2015   Procedure: COLONOSCOPY;  Surgeon: Jerene Bears, MD;  Location: [facility] ENDOSCOPY;  Service: Endoscopy;  Laterality: N/A;  . ENTEROSCOPY N/A 02/22/2016   Procedure: ENTEROSCOPY;  Surgeon: Milus Banister, MD;  Location: [place];  Service: Endoscopy;  Laterality: N/A;  . ESOPHAGOGASTRODUODENOSCOPY N/A 08/19/2015   Procedure: ESOPHAGOGASTRODUODENOSCOPY (EGD);  Surgeon: Jerene Bears, MD;  [facility] END[facility]COPY;  Service: Endoscopy;  Laterality: N/A;  patient scheduled, anesthesia aware of 1500 case, per [place].  08/18/15 DP  . GIVENS CAPSULE STUDY N/A 08/21/2015   Procedure: GIVENS CAPSULE STUDY;  Surgeon: Gatha Mayer, MD;  Location: [place];  Service: Endoscopy;  Laterality: N/A;  . HEMORRHOID SURGERY    . RIGHT HEART CATHETERIZATION N/A 08/18/2014   Procedure: RIGHT HEART CATH;  Surgeon: Jolaine Artist, MD;  Location: [facility] CATH LAB;  Service: Cardiovascular;  Laterality: N/A;  . TEE WITHOUT CARDIOVERSION N/A 01/05/2015   Procedure: TRANSESOPHAGEAL ECHOCARDIOGRAM (TEE);  Surgeon: Larey Dresser, MD;  Location: [place];  Service: Cardiovascular;  Laterality: N/A;  . WISDOM TOOTH EXTRACTION      SOCIAL HISTORY: Social History   Social History  .  Marital status: Legally Separated    Spouse name: N/A  . Number of children: 1  . Years of education: N/A   Occupational History  . Disability     Office Work   Social History Main Topics  . Smoking status: Former Smoker    Packs/day: 1.50    Years: 14.00    Types: Cigarettes    Quit date: 08/09/2003  . Smokeless tobacco: Never Used  . Alcohol use 0.0 oz/week     Comment: 02/20/2016 "drank a little in my teens"  . Drug use: No  . Sexual activity: No    Other Topics Concern  . Not on file   Social History Narrative  . No narrative on file    FAMILY HISTORY: Family History  Problem Relation Age of Onset  . Heart attack Mother   . Prostate cancer Father   . Diabetes Brother   . Kidney failure Brother   . Breast cancer Maternal Aunt     ALLERGIES:  has No Known Allergies.  MEDICATIONS:  Current Outpatient Prescriptions  Medication Sig Dispense Refill  . acetaminophen (TYLENOL) 325 MG tablet Take 2 tablets (650 mg total) by mouth every 6 (six) hours as needed for mild pain (or Fever >/= 101).    April Mueller allopurinol (ZYLOPRIM) 300 MG tablet Take 300 mg by mouth daily.    April Mueller ALPRAZolam (XANAX) 0.25 MG tablet Take 1 tablet (0.25 mg total) by mouth every 12 (twelve) hours as needed for anxiety or sleep. 60 tablet 5  . atorvastatin (LIPITOR) 10 MG tablet TAKE 1 TABLET DAILY 90 tablet 2  . carboxymethylcellulose (REFRESH PLUS) 0.5 % SOLN Place 1 drop into both eyes at bedtime.    . hydroxychloroquine (PLAQUENIL) 200 MG tablet Take 200 mg by mouth 2 (two) times daily.    . metoprolol succinate (TOPROL-XL) 25 MG 24 hr tablet Take 1 tablet (25 mg total) by mouth 2 (two) times daily. 10 tablet 0  . neomycin-polymyxin b-dexamethasone (MAXITROL) 3.5-10000-0.1 OINT Place 1 application into both eyes at bedtime.    . Omega-3 Fatty Acids (FISH OIL) 1200 MG CAPS Take 1,200 mg by mouth daily.    . OXYGEN Inhale 3-6 L into the lungs continuous.     . potassium chloride (K-DUR,KLOR-CON) 10 MEQ tablet Take 20-40 mEq by mouth 2 (two) times daily. 40 meq in the AM and 20 in the PM    . torsemide (DEMADEX) 20 MG tablet Take 3 tablets (60 mg total) by mouth 2 (two) times daily. 180 tablet 3   No current facility-administered medications for this visit.     REVIEW OF SYSTEMS:    10 Point review of Systems was done is negative except as noted above.  PHYSICAL EXAMINATION: ECOG PERFORMANCE STATUS: 3 - Symptomatic, >50% confined to bed  . Vitals:    03/11/17 0832  Weight: 193 lb 1.6 oz (87.6 kg)  Height: 5\' 4"  (1.626 m)   Filed Weights   03/11/17 0832  Weight: 193 lb 1.6 oz (87.6 kg)   .Body mass index is 33.15 kg/m.  GENERAL:alert, in no acute distress and comfortable on [place] oxygen SKIN: skin color, texture, turgor are normal, no rashes or significant lesions EYES: normal, conjunctiva are pink and non-injected, sclera clear OROPHARYNX:no exudate, no erythema and lips, buccal mucosa, and tongue normal  NECK: supple, no JVD, thyroid normal size, non-tender, without nodularity LYMPH:  no palpable lymphadenopathy in the cervical, axillary or inguinal LUNGS: clear to auscultation with normal respiratory effort HEART: regular  rate & rhythm,  no murmurs and no lower extremity edema ABDOMEN: abdomen soft, non-tender, normoactive bowel sounds  Musculoskeletal: no cyanosis of digits and no clubbing  PSYCH: alert & oriented x 3 with fluent speech NEURO: no focal motor/sensory deficits  LABORATORY DATA:  I have reviewed the data as listed . CBC Latest Ref Rng & Units 03/11/2017 01/21/2017 12/24/2016  WBC 3.9 - 10.3 10e3/uL 4.8 4.3 3.8(L)  Hemoglobin 11.6 - 15.9 g/dL 10.4(L) 10.5(L) 10.5(L)  Hematocrit 34.8 - 46.6 % 34.7(L) 33.9(L) 35.4  Platelets 145 - 400 10e3/uL 83(L) 75(L) 79(L)   . CBC    Component Value Date/Time   WBC 4.8 03/11/2017 0811   WBC 8.9 07/23/2016 0507   RBC 3.50 (L) 03/11/2017 0811   RBC 3.48 (L) 07/23/2016 0507   HGB 10.4 (L) 03/11/2017 0811   HCT 34.7 (L) 03/11/2017 0811   PLT 83 (L) 03/11/2017 0811   MCV 99.1 03/11/2017 0811   MCH 29.7 03/11/2017 0811   MCH 29.3 07/23/2016 0507   MCHC 30.0 (L) 03/11/2017 0811   MCHC 31.1 07/23/2016 0507   RDW 16.0 (H) 03/11/2017 0811   LYMPHSABS 1.0 03/11/2017 0811   MONOABS 0.4 03/11/2017 0811   EOSABS 0.0 03/11/2017 0811   BASOSABS 0.0 03/11/2017 0811    . CMP Latest Ref Rng & Units 03/11/2017 01/21/2017 12/24/2016  Glucose 70 - 140 mg/dl 118 112 130  BUN 7.0 - 26.0  mg/dL 41.1(H) 41.4(H) 36.9(H)  Creatinine 0.6 - 1.1 mg/dL 2.5(H) 2.3(H) 2.3(H)  Sodium 136 - 145 mEq/L 143 141 145  Potassium 3.5 - 5.1 mEq/L 3.8 4.2 4.2  Chloride 101 - 111 mmol/L - - -  CO2 22 - 29 mEq/L 30(H) 27 29  Calcium 8.4 - 10.4 mg/dL 10.3 10.3 10.5(H)  Total Protein 6.4 - 8.3 g/dL 7.5 7.7 8.0  Total Bilirubin 0.20 - 1.20 mg/dL 0.66 0.52 0.64  Alkaline Phos 40 - 150 U/L 133 138 134  AST 5 - 34 U/L 22 18 16   ALT 0 - 55 U/L 18 13 13     RADIOGRAPHIC STUDIES: I have personally reviewed the radiological images as listed and agreed with the findings in the report. No results found.  ASSESSMENT & PLAN:   71 year old female with multiple medical comorbidities including hypertension, COPD, CHF, gout, rheumatoid arthritis, moderate mitral regurgitation with  #1 Normocytic Normochromic anemia.-Her anemia is likely multifactorial from her chronic kidney disease plus rheumatoid arthritis. Her allopurinol and plaquenil could certainly be an additional factors especially if there is worsening renal function. 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 Also has intermittent acute on chronic GI bleeds while on anticoagulation with her Eliquis with the last one being in July 2017 requiring 5 units of PRBCs. She remains off her anticoagulation.  SPEP with no monoclonal protein. No clinical evidence of splenomegaly to suggest overt hypersplenism. Hemoglobin stable today at 10.4 Ferritin levels from today --pending. Ferritin level on 01/21/2017 were 193   #2 Mild thrombocytopenia -platelet count stable at 83k-- will monitor #2 rheumatoid arthritis - on plaquenil #3 chronic kidney disease #4 pulmonary hypertension #5 CHF  Plan - Patient's hemoglobin remained stable for several months. She prefers to space out her follow-ups which is quite reasonable and to hold off on Aranesp at this time . -We will have her come back for labs in 3 months  -We'll  follow-up on the on ferritin level from today and determine need for additional IV Iron if ferritin <100 -Continue optimal treatment  of rheumatoid arthritis -Continue follow-up with primary care physician for other medical cares.   RTC with Dr Irene Limbo in 3 months with labsLABS IN 27months RTC with Dr Irene Limbo with labs in 6 months  All of the patients questions were answered to her apparent satisfaction. The patient knows to call the clinic with any problems, questions or concerns.  I spent 20 minutes counseling the patient face to face. The total time spent in the appointment was 20 minutes and more than 50% was on counseling and direct patient cares and reviewed her extensive inpatient records and results    Sullivan Lone MD [place] [facility]AHIVMS [facility] Hematology/Oncology Physician [facility]  (Office):       [phone] (Work cell):  [phone] (Fax):           [phone]

## 2017-03-11 NOTE — Patient Instructions (Signed)
Thank you for choosing [facility] to provide your oncology and hematology care.  To afford each patient quality time with our providers, please arrive 30 minutes before your scheduled appointment time.  If you arrive late for your appointment, you may be asked to reschedule.  We strive to give you quality time with our providers, and arriving late affects you and other patients whose appointments are after yours.   If you are a no show for multiple scheduled visits, you may be dismissed from the clinic at the providers discretion.    Again, thank you for choosing [facility], our hope is that these requests will decrease the amount of time that you wait before being seen by our physicians.  ______________________________________________________________________  Should you have questions after your visit to the [facility], please contact our office at [phone] between the hours of 8:30 and 4:30 p.m.    Voicemails left after 4:30p.m will not be returned until the following business day.    For prescription refill requests, please have your pharmacy contact us directly.  Please also try to allow 48 hours for prescription requests.    Please contact the scheduling department for questions regarding scheduling.  For scheduling of procedures such as PET scans, CT scans, MRI, Ultrasound, etc please contact central scheduling at [phone].    Resources For Cancer Patients and Caregivers:   Oncolink.org:  A wonderful resource for patients and healthcare providers for information regarding your disease, ways to tract your treatment, what to expect, etc.     American Cancer Society:  800-227-2345  Can help patients locate various types of support and financial assistance  Cancer Care: 1-800-813-HOPE (4673) Provides financial assistance, online support groups, medication/co-pay assistance.    [place] DSS:  336-641-3447 Where to apply for food  stamps, Medicaid, and utility assistance  Medicare Rights Center: 800-333-4114 Helps people with Medicare understand their rights and benefits, navigate the Medicare system, and secure the quality healthcare they deserve  SCAT: 336-333-6589 [place] [employer]'s shared-ride transportation service for eligible riders who have a disability that prevents them from riding the fixed route bus.    For additional information on assistance programs please contact our social worker:   Grier Hock/Abigail Elmore:  [phone]            

## 2017-03-18 ENCOUNTER — Telehealth: Payer: Self-pay | Admitting: Emergency Medicine

## 2017-03-18 NOTE — Telephone Encounter (Signed)
lmtcb x1 for pt. 

## 2017-03-19 ENCOUNTER — Telehealth: Payer: Self-pay | Admitting: Emergency Medicine

## 2017-03-19 NOTE — Telephone Encounter (Signed)
Spoke with patient concerning upcoming appointments for 06/11/17 and 09/11/17. Patient viewed appointments in [place].

## 2017-03-19 NOTE — Telephone Encounter (Signed)
Called and spoke to pt. Pt is requesting an appt to f/u for CPAP. Pt last seen in 03/2016 and is needing her yearly appt. Appt made with BO on 04/03/17. Pt verbalized understanding and denied any further questions or concerns at this time.

## 2017-03-21 IMAGING — CR DG CHEST 2V
2 series · 2 of 2 positions shown · non-contrast
Comparison: Chest radiograph, [DATE].  Chest CT, 05/06/2013.

CLINICAL DATA: Dyspnea. History of hypertension. Ex-smoker. History
of COPD.

EXAM:
CHEST  2 VIEW

[view not recorded (1 of 2)]
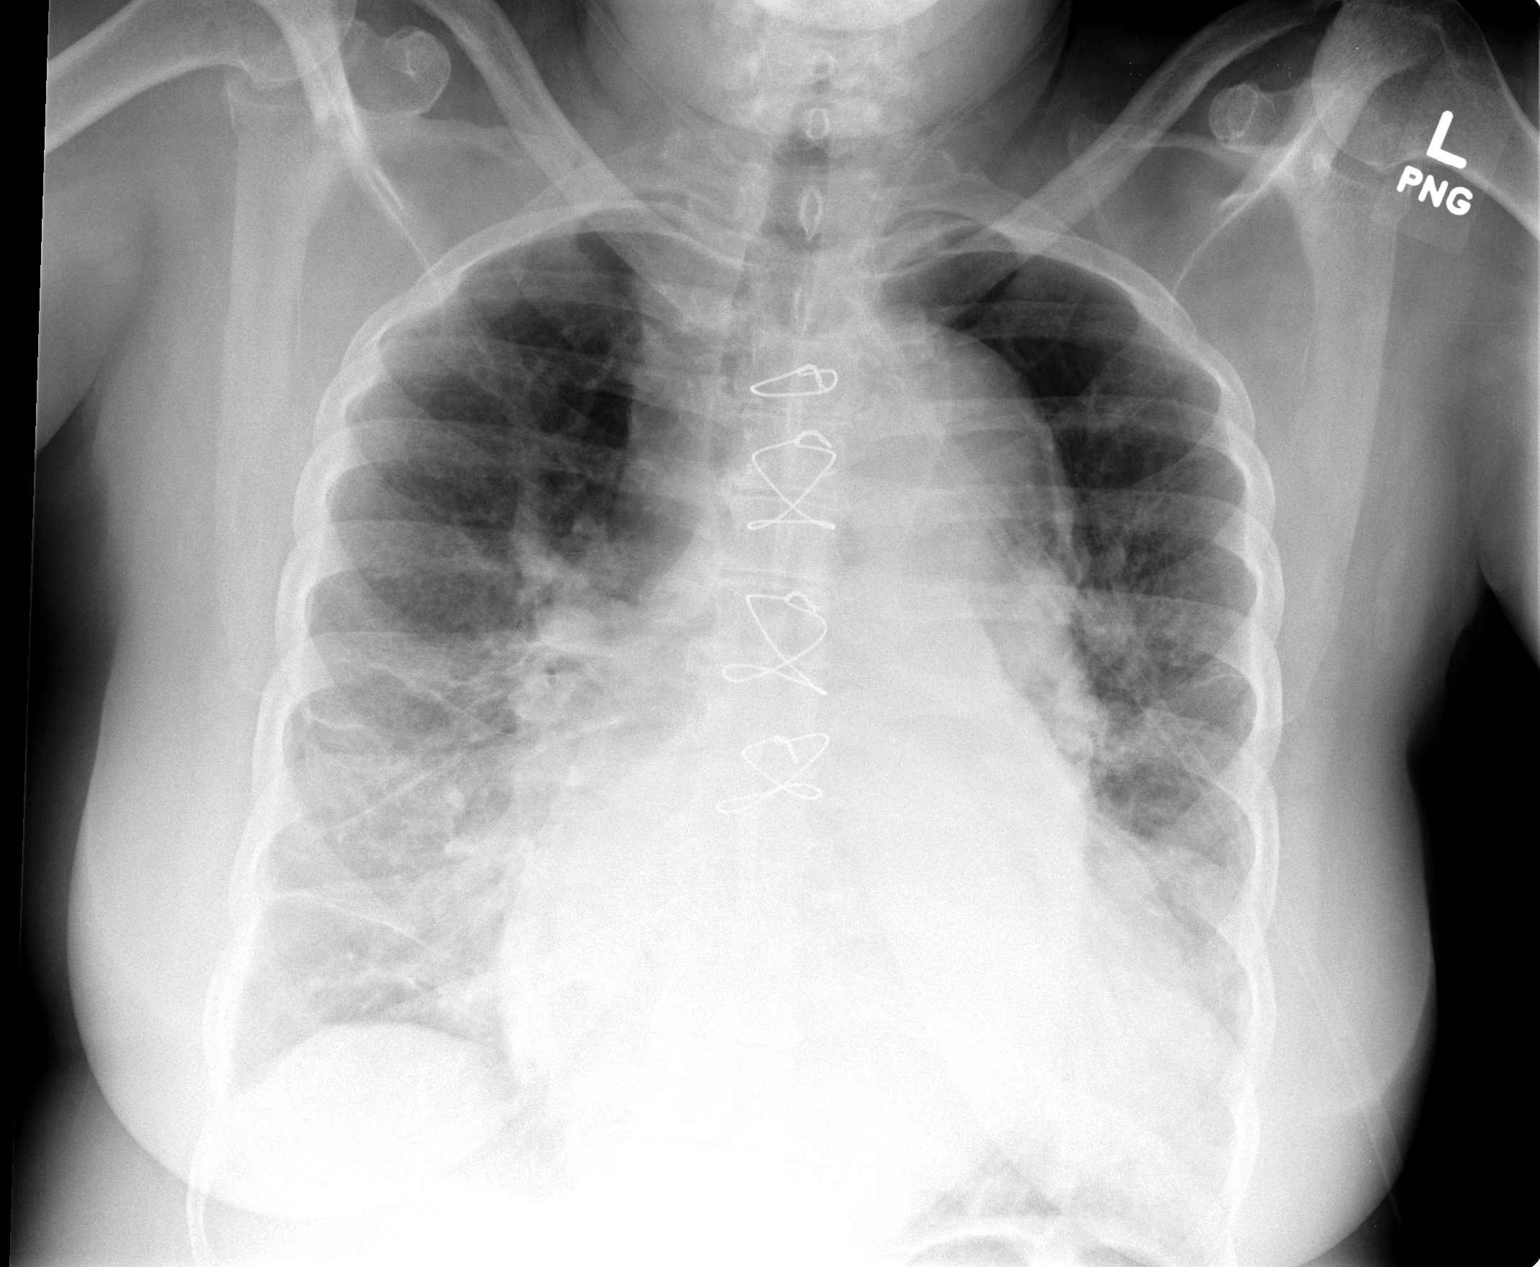

[view not recorded (2 of 2)]
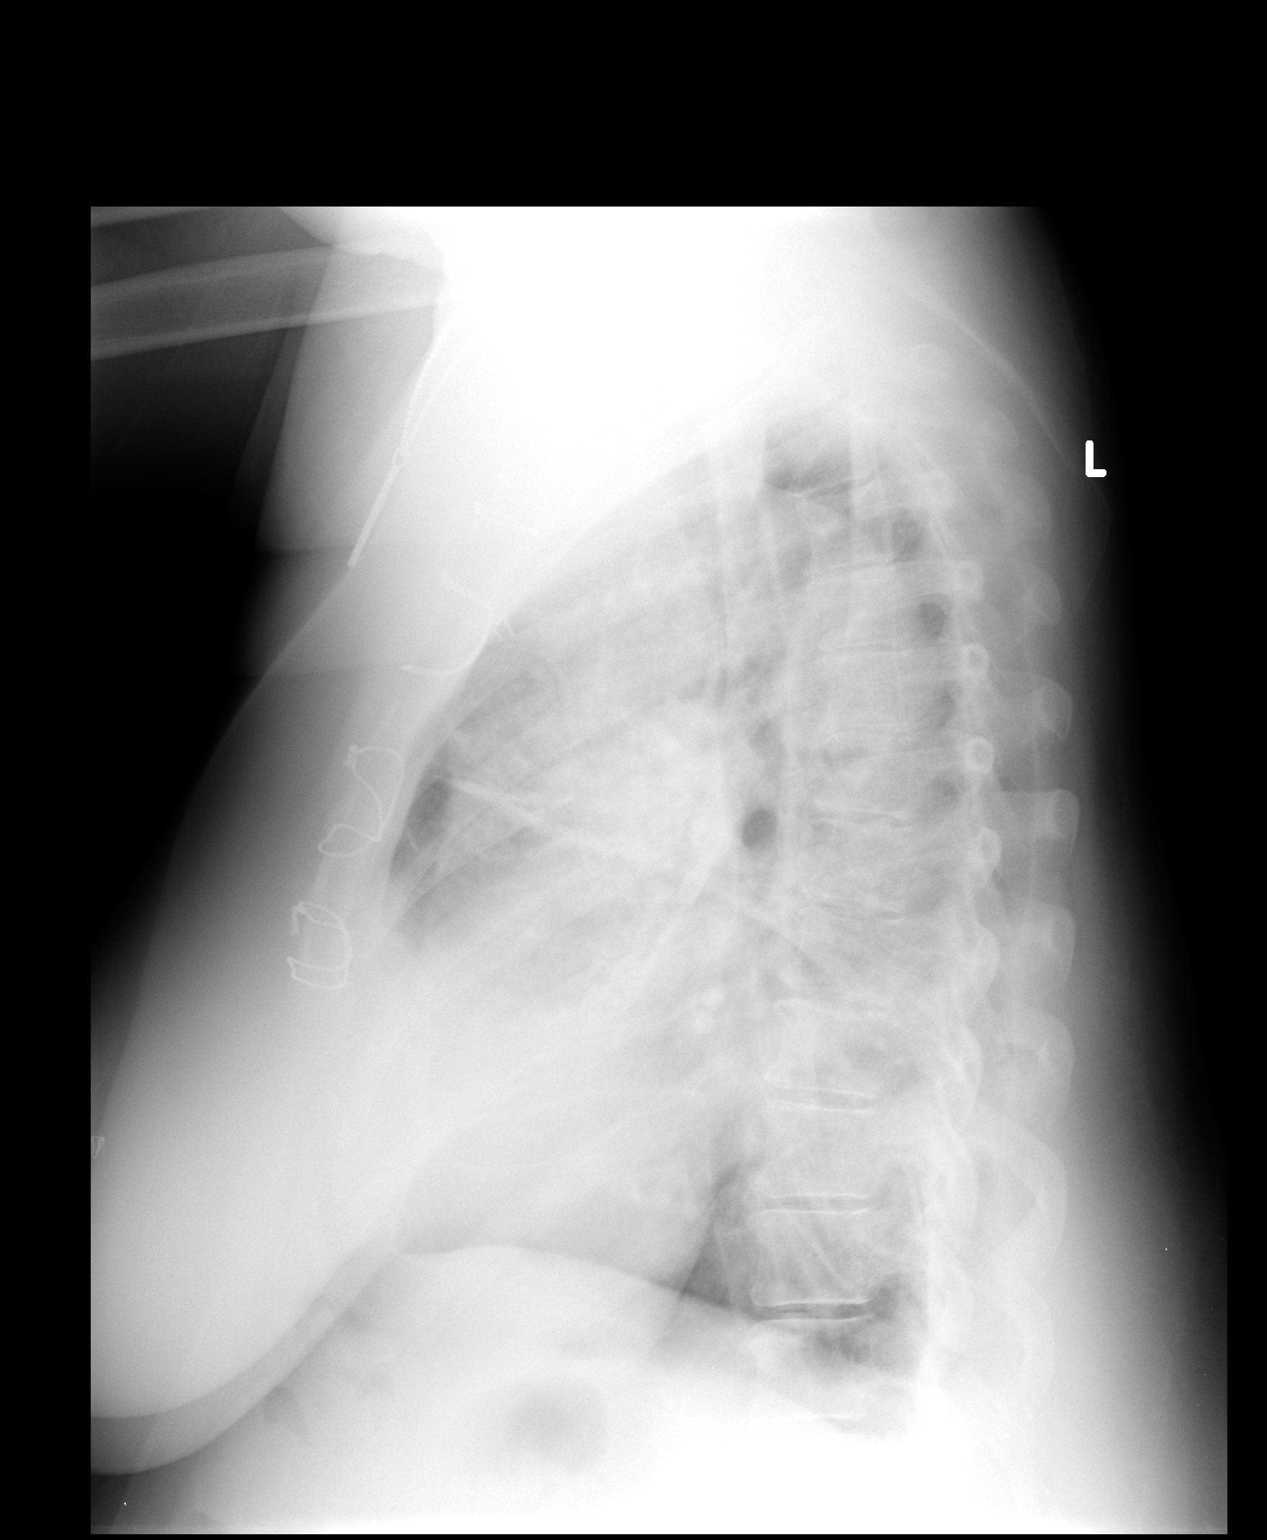

[2 of 2 positions shown; findings below may reference images not displayed]

FINDINGS: Moderate enlargement of the cardiopericardial silhouette. There
changes from median sternotomy. Aorta is dilated along the arch and
descending portion. This is stable.

There are areas of linear opacity in the right mid and lower lung
and the left lung base that are stable likely scarring. There is
mild central vascular congestion and interstitial prominence,
without overt pulmonary edema. Lungs are hyperexpanded. There is no
pleural effusion or pneumothorax.

Bony thorax is intact.
IMPRESSION: 1. No convincing acute cardiopulmonary disease.
2. Stable cardiomegaly. Stable areas of lung scarring. Stable
interstitial prominence and central vascular congestion. Findings
supports COPD. No change from the prior exam.

## 2017-04-02 ENCOUNTER — Other Ambulatory Visit (HOSPITAL_COMMUNITY): Payer: Self-pay | Admitting: Cardiology

## 2017-04-02 NOTE — Progress Notes (Signed)
[facility]    Chief Complaint  Patient presents with  . Follow-up    CPAP follow-up. Pt states that she has been doing great on CPAP.      Current Outpatient Prescriptions on File Prior to Visit  Medication Sig  . acetaminophen (TYLENOL) 325 MG tablet Take 2 tablets (650 mg total) by mouth every 6 (six) hours as needed for mild pain (or Fever >/= 101).  [name] allopurinol (ZYLOPRIM) 300 MG tablet Take 300 mg by mouth daily.  [name] ALPRAZolam (XANAX) 0.25 MG tablet Take 1 tablet (0.25 mg total) by mouth every 12 (twelve) hours as needed for anxiety or sleep.  [name] atorvastatin (LIPITOR) 10 MG tablet TAKE 1 TABLET DAILY  . hydroxychloroquine (PLAQUENIL) 200 MG tablet Take 200 mg by mouth 2 (two) times daily.  . metoprolol succinate (TOPROL-XL) 25 MG 24 hr tablet Take 1 tablet (25 mg total) by mouth 2 (two) times daily.  [name] neomycin-polymyxin b-dexamethasone (MAXITROL) 3.5-10000-0.1 OINT Place 1 application into both eyes at bedtime.  . Omega-3 Fatty Acids (FISH OIL) 1200 MG CAPS Take 1,200 mg by mouth daily.  . OXYGEN Inhale 3-6 L into the lungs continuous.   . potassium chloride (K-DUR,KLOR-CON) 10 MEQ tablet Take 20-40 mEq by mouth 2 (two) times daily. 40 meq in the AM and 20 in the PM  . torsemide (DEMADEX) 20 MG tablet Take 3 tablets (60 mg total) by mouth 2 (two) times daily.   No current facility-administered medications on file prior to visit.      Past Medical Hx:  has a past medical history of Anemia (03/23/15); Anxiety; Aortic aneurysm ([place]); Arthritis; CHF (congestive heart failure) ([place]); Chronic lower back pain; CKD (chronic kidney disease) stage 3, GFR 30-59 ml/min; COPD (chronic obstructive pulmonary disease) ([place]); Gout; Heart murmur; History of blood transfusion ("several"); Hypertension; Lupus; On home oxygen therapy; OSA on CPAP; Pulmonary HTN ([place]); Rheumatoid arthritis(714.0) (11/06/2012); and Tuberculosis.   Past Surgical hx, Allergies, Family hx, Social hx all  reviewed.  Vital Signs BP 118/68 (BP Location: Right Arm, Cuff Size: Normal)   Pulse (!) 55   Ht 5\' 4"  (1.626 m)   Wt 189 lb (85.7 kg)   SpO2 96%   BMI 32.44 kg/m   History of Present Illness April Mueller is a 71 y.o. 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    Reports she has had a rough year > admitted early in the year with urosepsis and had to go to a rehab briefly.  She has improved overall.  Currently, she reports she feels "fabulous".  Reports she has lost weight - weighed 210 lbs in 03/2016, now 189lbs.  She feels her medications are "doing their jobs".  Feels grateful that she is feeling well.    Equipment - new machine in Nov 2016, wears nasal pillows and loves them.  [employer] supplies her with new pillows and equipment monthly / every month  Sleep Study - last study in 2006/2007 but not having any new problems, loves her machine Driving - no sleepiness with driving Sleeping - no daytime drowsiness, sleeps 7-8 hours per night but has  interruptions for the bathroom.  Uses xanax for sleep. Does take planned naps during the day.    Physical Exam  General - well developed adult F in no acute distress ENT - No sinus tenderness, no oral exudate, no LAN Cardiac - s1s2 regular, SEM 2/6 Chest - even/non-labored, lungs bilaterally clear. No wheeze/rales Back - No focal tenderness Abd - Soft, non-tender Ext - 1+ BLE edema  Neuro - Normal strength Skin - No rashes Psych - normal mood, and behavior  Studies: 08/18/14 -- RHC RA = 14 RV = 62/3/15 PA = 63/21 (38) PCW = mean 23 v waves  to 40 Fick cardiac output/index = 6.3/3.2 Thermodilution CO/CI = 6.1/3.1 PVR = 2.0 WU FA sat = 91% PA sat = 57%, 62%  1. Mild to moderate pulmonary HTN with normal PVR which suggests pulmonary venous HTN 2. Moderately elevated left-sided pressures in the setting of her not taking lasix this am 3. Normal cardiac output  Assessment/Plan: Primary Pulmonary - Dr. 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  Continue to wear your CPAP every night 2.  We will send an order in for new supplies to [place] 3.  Your CPAP settings are 5-15 cm H2O 4.  Get the high dose flu shot.  You can get from a pharmacy, your primary care MD or come back to our office for injection 5.  Follow up in one year for review of your sleep apnea.  You are doing so well!  Keep up the good work! 6.  If you have new or worsening symptoms, please call to be seen 7.  Continue your Oxygen as previously prescribed      Noe Gens, NP-C [place]  [phone] 04/03/2017, 12:34 PM

## 2017-04-03 ENCOUNTER — Encounter: Payer: Self-pay | Admitting: Pulmonary Disease

## 2017-04-03 ENCOUNTER — Ambulatory Visit (INDEPENDENT_AMBULATORY_CARE_PROVIDER_SITE_OTHER): Payer: Medicare Other | Admitting: Pulmonary Disease

## 2017-04-03 VITALS — BP 118/68 | HR 55 | Ht 64.0 in | Wt 189.0 lb

## 2017-04-03 DIAGNOSIS — Z9989 Dependence on other enabling machines and devices: Secondary | ICD-10-CM

## 2017-04-03 DIAGNOSIS — G4733 Obstructive sleep apnea (adult) (pediatric): Secondary | ICD-10-CM

## 2017-04-03 DIAGNOSIS — J449 Chronic obstructive pulmonary disease, unspecified: Secondary | ICD-10-CM | POA: Diagnosis not present

## 2017-04-03 DIAGNOSIS — J9611 Chronic respiratory failure with hypoxia: Secondary | ICD-10-CM

## 2017-04-03 NOTE — Patient Instructions (Addendum)
1.  Continue to wear your CPAP every night 2.  We will send an order in for new supplies to [place] 3.  Your CPAP settings are 5-15 cm H2O 4.  Get the high dose flu shot.  You can get from a pharmacy, your primary care MD or come back to our office for injection 5.  Follow up in one year for review of your sleep apnea.  You are doing so well!  Keep up the good work! 6.  If you have new or worsening symptoms, please call to be seen 7.  Continue your Oxygen as previously prescribed

## 2017-05-07 ENCOUNTER — Ambulatory Visit (HOSPITAL_COMMUNITY)
Admission: RE | Admit: 2017-05-07 | Discharge: 2017-05-07 | Disposition: A | Discharge status: Home / Self Care | Payer: Medicare Other | Source: Ambulatory Visit | Attending: Cardiology | Admitting: Cardiology

## 2017-05-07 ENCOUNTER — Encounter (HOSPITAL_COMMUNITY): Payer: Self-pay | Admitting: Cardiology

## 2017-05-07 VITALS — BP 116/65 | HR 62 | Wt 187.8 lb

## 2017-05-07 DIAGNOSIS — M069 Rheumatoid arthritis, unspecified: Secondary | ICD-10-CM | POA: Insufficient documentation

## 2017-05-07 DIAGNOSIS — I482 Chronic atrial fibrillation: Secondary | ICD-10-CM | POA: Insufficient documentation

## 2017-05-07 DIAGNOSIS — Z833 Family history of diabetes mellitus: Secondary | ICD-10-CM | POA: Insufficient documentation

## 2017-05-07 DIAGNOSIS — G4733 Obstructive sleep apnea (adult) (pediatric): Secondary | ICD-10-CM | POA: Diagnosis not present

## 2017-05-07 DIAGNOSIS — I272 Pulmonary hypertension, unspecified: Secondary | ICD-10-CM | POA: Insufficient documentation

## 2017-05-07 DIAGNOSIS — J9610 Chronic respiratory failure, unspecified whether with hypoxia or hypercapnia: Secondary | ICD-10-CM | POA: Insufficient documentation

## 2017-05-07 DIAGNOSIS — I4892 Unspecified atrial flutter: Secondary | ICD-10-CM | POA: Diagnosis not present

## 2017-05-07 DIAGNOSIS — D696 Thrombocytopenia, unspecified: Secondary | ICD-10-CM | POA: Insufficient documentation

## 2017-05-07 DIAGNOSIS — Z841 Family history of disorders of kidney and ureter: Secondary | ICD-10-CM | POA: Diagnosis not present

## 2017-05-07 DIAGNOSIS — I481 Persistent atrial fibrillation: Secondary | ICD-10-CM | POA: Diagnosis not present

## 2017-05-07 DIAGNOSIS — Z8042 Family history of malignant neoplasm of prostate: Secondary | ICD-10-CM | POA: Diagnosis not present

## 2017-05-07 DIAGNOSIS — Z803 Family history of malignant neoplasm of breast: Secondary | ICD-10-CM | POA: Diagnosis not present

## 2017-05-07 DIAGNOSIS — D649 Anemia, unspecified: Secondary | ICD-10-CM | POA: Insufficient documentation

## 2017-05-07 DIAGNOSIS — J449 Chronic obstructive pulmonary disease, unspecified: Secondary | ICD-10-CM | POA: Insufficient documentation

## 2017-05-07 DIAGNOSIS — Z87891 Personal history of nicotine dependence: Secondary | ICD-10-CM | POA: Diagnosis not present

## 2017-05-07 DIAGNOSIS — I130 Hypertensive heart and chronic kidney disease with heart failure and stage 1 through stage 4 chronic kidney disease, or unspecified chronic kidney disease: Secondary | ICD-10-CM | POA: Insufficient documentation

## 2017-05-07 DIAGNOSIS — I340 Nonrheumatic mitral (valve) insufficiency: Secondary | ICD-10-CM | POA: Insufficient documentation

## 2017-05-07 DIAGNOSIS — E669 Obesity, unspecified: Secondary | ICD-10-CM | POA: Diagnosis not present

## 2017-05-07 DIAGNOSIS — Z9981 Dependence on supplemental oxygen: Secondary | ICD-10-CM | POA: Diagnosis not present

## 2017-05-07 DIAGNOSIS — Z79899 Other long term (current) drug therapy: Secondary | ICD-10-CM | POA: Insufficient documentation

## 2017-05-07 DIAGNOSIS — Z8249 Family history of ischemic heart disease and other diseases of the circulatory system: Secondary | ICD-10-CM | POA: Insufficient documentation

## 2017-05-07 DIAGNOSIS — I5032 Chronic diastolic (congestive) heart failure: Secondary | ICD-10-CM | POA: Diagnosis not present

## 2017-05-07 DIAGNOSIS — I4819 Other persistent atrial fibrillation: Secondary | ICD-10-CM

## 2017-05-07 DIAGNOSIS — N184 Chronic kidney disease, stage 4 (severe): Secondary | ICD-10-CM | POA: Diagnosis not present

## 2017-05-07 NOTE — Patient Instructions (Signed)
Your physician recommends that you schedule a follow-up appointment in: 6 months  

## 2017-05-07 NOTE — Progress Notes (Signed)
Patient ID: April Mueller, female   DOB: 1946/02/04, 71 y.o.   MRN: [id number]   HPI  PCP: Dr Harrington Challenger Pulmonary: Dr Byrum/Dr Melvyn Novas  Cardiology: Dr. Aundra Dubin  April Mueller is a 71 y.o. with COPD on home oxygen, rheumatoid arthritis, chronic atrial fibrillation, chronic diastolic CHF, CKD.    Moved back from [place] and saw Dr. Lamonte Sakai. In [place] was followed by cardiology and pulmonary. Started on Tracleer in 2006. Revatio was added, however was discontinued without any change in her symptoms. Stopped smoking in January 2005 when she had aortic aneurysm repair. Smoked 1.5-2 ppd x 15 - 20 years.  Later on macitentan.  I had her do a TEE in 6/16.  She had a loud MR murmur.  TEE showed very eccentric, posteriorly-directed MR that may be from subtle prolapse of the anterior mitral leaflet.  I am concerned that the MR could be severe but very difficult to fully visualize.  She additionally had PFTs done that were abnormal, but according to her pulmonologist Melvyn Novas), the obstruction was really only minimal and the restriction was primarily related to body habitus.  He does not think that we can explain her hypoxemia and dyspnea primarily from parenchymal lung pathology.  I sent her to [facility] for evaluation for percutaneous mitral valve clip.  They did not think that she was a good candidate.   She has been anemic and received 1 unit PRBCs in 8/16.  She saw GI and was noted to be heme negative, no scopes were done (heme negative and high risk). She has had iron infusions.   Opsumit was stopped in 9/16 due to suspicion for group 2 pulmonary hypertension only (low PVR) and she felt better off it.   She was admitted in 1/17 with increased dyspnea and was found to be profoundly anemic.  She had a total of 5 units PRBCs and an iron infusion in the hospital.  Stool was FOBT+, melena.  EGD, colonoscopy, and capsule endoscopy were all done without clear source of blood loss.  While in the hospital, she was noted to go into atrial  fibrillation with RVR.  She initially required amiodarone for rate control, but was transitioned over to Toprol XL. She was not anticoagulated due to concern for GI blood loss and anemia.  Anticoagulation was attempted later.   RHC in 3/17 showed elevated right heart filling pressures and mildly elevated PCWP.  There was mild pulmonary hypertension, likely pulmonary venous hypertension with PVR 1.9 WU.   She was admitted in 7/17 with symptomatic anemia, received 3 units PRBCs.  Anticoagulation was stopped.  Small bowel enteroscopy did not show a source of bleeding.    She was seen by Dr Rayann Heman, he recommended against Watchman. She is now off anticoagulation.  Has had Fe infusions and getting Aranesp.   Admitted 12/7 through 07/23/16. Treated for UTI, sepsis and volume overload. Discharged to [facility] SN[facility] ongoing rehab. Discharge weight  196 pounds.   She returns for follow up of CHF and atrial fibrillation. She has been doing well overall, has not been back in the hospital since 12/17.  No BRBPR/melena. 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  Weight down 10 lbs.  No dyspnea walking on flat ground with her oxygen.  No chest pain.  She remains in chronic atrial flutter.  No stroke-like symptoms.   ECG (personally reviewed): atrial flutter at 56 bpm  Labs 02/05/13 K 3.8 Creatinine 1.37 05/08/13 K 3.8, Creatinine 1.58  11/14 K 4.1, creatinine 1.82 08/09/2014: K 4.0 Creatinine 1.45  5/16: K 3.7, creatinine 1.76, BNP 447 6/16: K 5 => 4.6, creatinine 2.09 => 2.13, BNP 291 8/16: K 4.7, creatinine 2.1, HCT 30.7 9/16: K 4.5, creatinine 2.3, HCT 34.1 1/17: K 4.8, creatinine 1.84 => 1.87, HCT 29.7, plts 136, FOBT+, BNP 403 2/17: K 3.5, creatinine 1.89, BNP 329, hgb 10 3/17: K 3.8, creatinine 2.5 => 2.3, HCT 35.4 => 33.5 5/17: K 3.7, creatinine 2.0, hgb 10.6 7/17: K 3.7, creatinine 2, hgb 9.9 8/17: K 3.8, creatinine 1.9, HCT 31.3 9/17: K 3.9,  creatinine 2 07/23/2016: K 4.3 Creatinine 2.76  08/02/2016: K 4.7 Creatinine 2.0  4/18: K 4, creatinine 2.3, hgb 10.7 8/18: K 4, creatinine 2.13, hgb 10.4, plts 83 9/18: LDL 59  1) COPD       --former smoker (28 pk-yrs)       --on home O2 - 4L/min rest, up to 5L/min exertion.        --spirometry 6/13 in [place] = 0.85L FVC = 1.3L no DLCO measured. FEV1 in 2014 = 0.73        --CT chest 10/14 - moderate centrilobular emphysema. No pulm fibrosis but there was evidence for post-infectious/inflammatory scarring. Stable Asc Ao repair       --PFTs (11/14) FVC 46%, FEV1 0.8L (40%), ratio 87%, FEF 25-75% 0.44L DLCO 22%, TLC 50% => PFTs in 06/2013 did NOT suggest significant airflow obstruction according to Dr Gustavus Bryant last note despite "moderate emphysema" on CT.        --PFTs (6/16) were abnormal with severe obstruction, severe restriction, severely decreased DLCO.  Reviewed with Dr Melvyn Novas => he thinks obstruction is actually fairly minimal and that the restriction is due to body habitus.   2) Obesity 3) RA       --on plaquenil, sees Dr Lenna Gilford.  4) Type A aortic dissection s/p repair 2005     --c/b renal failure requiring HD x 3 months 5) PAH - previously treated in [place].  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      --on macitentan (revatio stopped 10/14).     --V/Q scan 11/14 no chronic PE     -- 2015 Tracleer stopped and started on macitentan. 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      -- RHC 08/18/2014: No role for pulmonary vasodilators.  RA = 14 RV = 62/3/15 PA = 63/21 (38) PCW = mean 23 v waves to 40 Fick cardiac output/index = 6.3/3.2 Thermodilution CO/CI = 6.1/3.1, PVR = 2.0 WU, FA sat = 91% PA sat = 57%, 62%     -- RHC 3/17: mean RA 13, PA 44/2 mean 29, mean PCWP 17, CI 3.04, PVR 1.9 WU => pulmonary venous hypertension. 6) OSA 7) Diastolic CHF    --ECHO 1/96 EF 55-60% RV mildly dilated with normal function. Mild to moderate posterior MR. RVSP 56. Probable  restrictive diastolic filling pattern    --ECHO 9/15 EF 60-65% RV mildly dilated with normal function. Mild to moderate posterior MR. RVSP 40. Ao Root ok    --TEE 6/16 with EF 55-60%, D-shaped interventricular septum suggestive of RV pressure/volume overload, RV mildly dilated with normal systolic function, peak RV-RA gradient 57 mmHg, very eccentric posteriorly-directed mitral regurgitation, possibly severe, etiology may be a very small area of prolapse on the anterior leaflet, s/p ascending aorta repair with residual dissection flap in the descending thoracic aorta.       --Echo (1/17) with EF 55-60%,  mild to moderate MR, PASP 66     --Echo (12/17) with EF 55-60%, grade II diastolic dysfunction, mild MR, mildly decreased RV systolic function, PASP 66 mmHg.  8) CKD 9) Mitral regurgitation: Possibly severe by 6/16 TEE, very eccentric.  Not good candidate for percutaneous MV clip (seen at [facility]).  Echo (1/17) with only mild to moderate MR.  10) Anemia: Suspect from chronic disease/renal disease and well as chronic GI bleeding.  EGD/colonoscopy/capsule endoscopy in 1/17 did not show any definite site for blood loss.  Small bowel enteroscopy in 7/17 did not show etiology of bleeding. 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  Getting Fe infusions.  11) Atrial fibrillation: Persistent, 1st noted in 1/17.  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   ROS: All systems reviewed and negative except as per HPI.  Current Outpatient Prescriptions on File Prior to Encounter  Medication Sig Dispense Refill  . acetaminophen (TYLENOL) 325 MG tablet Take 2 tablets (650 mg total) by mouth every 6 (six) hours as needed for mild pain (or Fever >/= 101).    [name] allopurinol (ZYLOPRIM) 300 MG tablet Take 300 mg by mouth daily.    [name] ALPRAZolam (XANAX) 0.25 MG tablet Take 1 tablet (0.25 mg total) by mouth every 12 (twelve) hours as needed for anxiety or sleep. 60 tablet 5  . atorvastatin (LIPITOR) 10 MG  tablet TAKE 1 TABLET DAILY 90 tablet 2  . Biotin 5000 MCG CAPS Take 1 capsule by mouth daily.    . hydroxychloroquine (PLAQUENIL) 200 MG tablet Take 200 mg by mouth 2 (two) times daily.    . metoprolol succinate (TOPROL-XL) 25 MG 24 hr tablet Take 1 tablet (25 mg total) by mouth 2 (two) times daily. 10 tablet 0  . neomycin-polymyxin b-dexamethasone (MAXITROL) 3.5-10000-0.1 OINT Place 1 application into both eyes at bedtime.    . Omega-3 Fatty Acids (FISH OIL) 1200 MG CAPS Take 1,200 mg by mouth daily.    . OXYGEN Inhale 3-6 L into the lungs continuous.     . torsemide (DEMADEX) 20 MG tablet Take 3 tablets (60 mg total) by mouth 2 (two) times daily. 180 tablet 3   No current facility-administered medications on file prior to encounter.    Social History   Social History  . Marital status: Legally Separated    Spouse name: N/A  . Number of children: 1  . Years of education: N/A   Occupational History  . Disability     Office Work   Social History Main Topics  . Smoking status: Former Smoker    Packs/day: 1.50    Years: 14.00    Types: Cigarettes    Quit date: 08/09/2003  . Smokeless tobacco: Never Used  . Alcohol use 0.0 oz/week     Comment: 02/20/2016 "drank a little in my teens"  . Drug use: No  . Sexual activity: No   Other Topics Concern  . None   Social History Narrative  . None   Family History  Problem Relation Age of Onset  . Heart attack Mother   . Prostate cancer Father   . Diabetes Brother   . Kidney failure Brother   . Breast cancer Maternal Aunt     Vitals:   05/07/17 0854  BP: 116/65  Pulse: 62  SpO2: 99%  Weight: 187 lb 12 oz (85.2 kg)   General: NAD Neck: No JVD, no thyromegaly or thyroid nodule.  Lungs: Clear to auscultation bilaterally with normal respiratory effort. CV: Nondisplaced PMI.  Heart irregular  S1/S2, no S3/S4, 3/6 HSM apex.  Trace ankle edema.  No carotid bruit.  Normal pedal pulses.  Abdomen: Soft, nontender, no  hepatosplenomegaly, no distention.  Skin: Intact without lesions or rashes.  Neurologic: Alert and oriented x 3.  Psych: Normal affect. Extremities: No clubbing or cyanosis.  HEENT: Normal.   Assessment/Plan: 1. Chronic diastolic CHF with pulmonary hypertension/RV failure: NYHA II. Weight is down 10 lbs.  Doubt significant volume overload. - Continue torsemide to 60 mg twice a day.  - Recent BMET ok.   2. Chronic respiratory failure:  She is on 3 liters at rest, 4-5 L with exertion home oxygen by [place]. She has COPD and restriction from her body habitus (OHS).  3. OSA: Continue CPAP nightly.  4. CKD: Stage III-IV. Creatinine baseline 2-2.3. This has been stable 5. Pulmonary hypertension: This is primarily [place] group 2 (pulmonary venous hypertension) based on RHC in 3/17.  She is not a good candidate for pulmonary vasodilators.   6 Mitral regurgitation:  Recent echo in 07/2016 mild MR.  7. Anemia: Suspect anemia of chronic disease/renal disease but also with component of GI blood loss.  She has had GI bleeding with FOBT+, but unable to visualize bleeding source on EGD, colonoscopy, enteroscopy, or capsule endoscopy (most recently in 7/17).  Seeing hematology (Dr. Irene Limbo), getting Aranesp and Fe infusions with Fe deficiency and possible MDS.  8. Atrial fibrillation: Persistent.  Rate is controlled.  CHADSVASC = 3.  She was evaluated for Watchman but decided not to be a good candidate. Due to recurrent severe GI bleeding, anticoagulation was stopped altogether. No stroke-like symptoms.  9. 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   Followup in 6 months.   [name] MD 05/07/2017

## 2017-06-11 ENCOUNTER — Other Ambulatory Visit (HOSPITAL_BASED_OUTPATIENT_CLINIC_OR_DEPARTMENT_OTHER): Payer: Medicare Other

## 2017-06-11 DIAGNOSIS — D469 Myelodysplastic syndrome, unspecified: Secondary | ICD-10-CM | POA: Diagnosis not present

## 2017-06-11 DIAGNOSIS — D638 Anemia in other chronic diseases classified elsewhere: Secondary | ICD-10-CM

## 2017-06-11 DIAGNOSIS — D696 Thrombocytopenia, unspecified: Secondary | ICD-10-CM

## 2017-06-11 LAB — COMPREHENSIVE METABOLIC PANEL
ALT: 21 U/L (ref 0–55)
ANION GAP: 7 meq/L (ref 3–11)
AST: 24 U/L (ref 5–34)
Albumin: 3 g/dL — ABNORMAL LOW (ref 3.5–5.0)
Alkaline Phosphatase: 152 U/L — ABNORMAL HIGH (ref 40–150)
BUN: 35.6 mg/dL — ABNORMAL HIGH (ref 7.0–26.0)
CHLORIDE: 107 meq/L (ref 98–109)
CO2: 28 meq/L (ref 22–29)
Calcium: 10 mg/dL (ref 8.4–10.4)
Creatinine: 2.2 mg/dL — ABNORMAL HIGH (ref 0.6–1.1)
EGFR: 26 mL/min/{1.73_m2} — AB (ref >60)
GLUCOSE: 114 mg/dL (ref 70–140)
POTASSIUM: 4 meq/L (ref 3.5–5.1)
SODIUM: 143 meq/L (ref 136–145)
Total Bilirubin: 0.64 mg/dL (ref 0.20–1.20)
Total Protein: 7.7 g/dL (ref 6.4–8.3)

## 2017-06-11 LAB — CBC & DIFF AND RETIC
BASO%: 0.9 % (ref 0.0–2.0)
BASOS ABS: 0 10*3/uL (ref 0.0–0.1)
EOS ABS: 0 10*3/uL (ref 0.0–0.5)
EOS%: 0.5 % (ref 0.0–7.0)
HEMATOCRIT: 33.9 % — AB (ref 34.8–46.6)
HEMOGLOBIN: 10.5 g/dL — AB (ref 11.6–15.9)
IMMATURE RETIC FRACT: 5.9 % (ref 1.60–10.00)
LYMPH#: 0.8 10*3/uL — AB (ref 0.9–3.3)
LYMPH%: 21.2 % (ref 14.0–49.7)
MCH: 29.4 pg (ref 25.1–34.0)
MCHC: 30.9 g/dL — ABNORMAL LOW (ref 31.5–36.0)
MCV: 95.3 fL (ref 79.5–101.0)
MONO#: 0.3 10*3/uL (ref 0.1–0.9)
MONO%: 8.5 % (ref 0.0–14.0)
NEUT#: 2.6 10*3/uL (ref 1.5–6.5)
NEUT%: 68.9 % (ref 38.4–76.8)
PLATELETS: 66 10*3/uL — AB (ref 145–400)
RBC: 3.56 10*6/uL — ABNORMAL LOW (ref 3.70–5.45)
RDW: 16.6 % — AB (ref 11.2–14.5)
RETIC %: 1.22 % (ref 0.70–2.10)
RETIC CT ABS: 43.43 10*3/uL (ref 33.70–90.70)
WBC: 3.7 10*3/uL — ABNORMAL LOW (ref 3.9–10.3)

## 2017-06-11 LAB — FERRITIN: Ferritin: 185 ng/ml (ref 9–269)

## 2017-06-11 LAB — IRON AND TIBC
%SAT: 14 % — AB (ref 21–57)
Iron: 36 ug/dL — ABNORMAL LOW (ref 41–142)
TIBC: 263 ug/dL (ref 236–444)
UIBC: 227 ug/dL (ref 120–384)

## 2017-06-17 ENCOUNTER — Other Ambulatory Visit (HOSPITAL_COMMUNITY): Payer: Self-pay | Admitting: *Deleted

## 2017-06-17 MED ORDER — POTASSIUM CHLORIDE CRYS ER 10 MEQ PO TBCR: EXTENDED_RELEASE_TABLET | ORAL | 3 refills | Status: DC

## 2017-07-22 ENCOUNTER — Other Ambulatory Visit (HOSPITAL_COMMUNITY): Payer: Self-pay | Admitting: *Deleted

## 2017-07-22 MED ORDER — TORSEMIDE 20 MG PO TABS: 60.0000 mg | ORAL_TABLET | Freq: Two times a day (BID) | ORAL | 3 refills | Status: DC

## 2017-07-26 ENCOUNTER — Encounter: Payer: Self-pay | Admitting: Physician Assistant

## 2017-07-26 ENCOUNTER — Ambulatory Visit: Payer: Medicare Other | Admitting: Physician Assistant

## 2017-07-26 VITALS — BP 112/72 | HR 56 | Ht 64.0 in | Wt 187.4 lb

## 2017-07-26 DIAGNOSIS — K648 Other hemorrhoids: Secondary | ICD-10-CM | POA: Diagnosis not present

## 2017-07-26 DIAGNOSIS — K625 Hemorrhage of anus and rectum: Secondary | ICD-10-CM | POA: Diagnosis not present

## 2017-07-26 DIAGNOSIS — D638 Anemia in other chronic diseases classified elsewhere: Secondary | ICD-10-CM | POA: Diagnosis not present

## 2017-07-26 NOTE — Progress Notes (Signed)
Assessment and plans reviewed  

## 2017-07-26 NOTE — Progress Notes (Signed)
Chief Complaint: Rectal Bleeding  HPI:     April Mueller is a 71 year old African-American female with a past medical history as listed below including CKD, CHF and COPD on chronic oxygen, who was referred to me by Lawerance Cruel, MD for a complaint of rectal bleeding.      She recently saw her PCP on 07/24/17 and CBC which showed a hemoglobin low at 10.7 although her MCV was high at 95.5, she was referred to [place] after describing an episode of bright red blood in her toilet and her "sanitary pad".  At time of that visit patient had a rectal exam which showed Hemoccult positive stool.    Per review of chart patient had a complete workup for anemia last year.  She was assigned to Dr. Henrene Pastor at her first visit here.  She had a colonoscopy, EGD, small bowel enteroscopy and pill capsule endoscopy.  All of these were unrevealing and it was suspected that her anemia was multifactorial with chronic disease component likely.    Today, the patient presents to clinic and explains that on Wednesday afternoon she went to urinate and saw some blood on her sanitary pad and also had blood dripping in the toilet.  The patient had a couple of more episodes and a scant amount of bright red blood on her sanitary pad the next morning, but after that time the patient has seen no further bleeding and has had a regular bowel movement.  She denies any abdominal pain, rectal pain or similar instances prior to this.  Overall the patient feels the same as prior to these episodes. Patient tells me she saw her PCP who did "testing of my stool and it was positive for blood".    Patient denies fever, chills, melena, weight loss, anorexia, nausea, vomiting, heartburn, reflux or symptoms that awaken her at night.  GI History: 08/20/15 Colonoscopy for anemia/FOBT +/hx polyps, Dr Hilarie Fredrickson.  Sessile polyps at cecum, 3 and 10 mm, larger polypectomy site endoclipped.  Sessile polyps x 3 (5 - 7 mm) at ascending. )Path: tubular adenomas without  HGD). Severe sigmoid diverticulosis.  08/19/15 EGD, Dr Hilarie Fredrickson.  Normal study. 08/21/15 Capsule endoscopy, Dr Carlean Purl: normal study.  Left stomach at 23 m, left SB at 2 hours 8 minutes 02/12/16-Small bowel enteroscopy-Dr. Ardis Hughs: normal  Past Medical History:  Diagnosis Date  . Anemia 03/23/15   transfusion  . Anxiety   . Aortic aneurysm ([place])   . Arthritis    "qwhere" (02/20/2016)  . CHF (congestive heart failure) ([place])   . Chronic lower back pain   . CKD (chronic kidney disease) stage 3, GFR 30-59 ml/min ([facility])    "on dialysis for 3 months after my aneurysm in 2005" (02/20/2016)  . COPD (chronic obstructive pulmonary disease) ([place])   . Gout   . Heart murmur   . History of blood transfusion "several"  . Hypertension   . Lupus   . On home oxygen therapy    "3L; 24/7" (02/20/2016)  . OSA on CPAP   . Pulmonary HTN ([place])   . Rheumatoid arthritis(714.0) 11/06/2012  . Tuberculosis    dormant carrier    Past Surgical History:  Procedure Laterality Date  . ABDOMINAL AORTIC ANEURYSM REPAIR  2005  . BREAST EXCISIONAL BIOPSY Right    benign  . BREAST LUMPECTOMY Right   . CARDIAC CATHETERIZATION N/A 10/24/2015   Procedure: Right Heart Cath;  Surgeon: Larey Dresser, MD;  Location: [place] [facility];  Service: Cardiovascular;  Laterality: N/A;  . CATARACT EXTRACTION W/ INTRAOCULAR LENS  IMPLANT, BILATERAL Bilateral 2016  . COLONOSCOPY N/A 08/19/2015   Procedure: COLONOSCOPY;  Surgeon: Jerene Bears, MD;  Location: [facility] ENDOSCOPY;  Service: Endoscopy;  Laterality: N/A;  . ENTEROSCOPY N/A 02/22/2016   Procedure: ENTEROSCOPY;  Surgeon: Milus Banister, MD;  Location: [place];  Service: Endoscopy;  Laterality: N/A;  . ESOPHAGOGASTRODUODENOSCOPY N/A 08/19/2015   Procedure: ESOPHAGOGASTRODUODENOSCOPY (EGD);  Surgeon: Jerene Bears, MD;  Location: [facility] ENDOSCOPY;  Service: Endoscopy;  Laterality: N/A;  patient scheduled, anesthesia aware of 1500 case, per [place].  08/18/15 DP  . GIVENS CAPSULE STUDY N/A  08/21/2015   Procedure: GIVENS CAPSULE STUDY;  Surgeon: Gatha Mayer, MD;  Location: [place];  Service: Endoscopy;  Laterality: N/A;  . HEMORRHOID SURGERY    . RIGHT HEART CATHETERIZATION N/A 08/18/2014   Procedure: RIGHT HEART CATH;  Surgeon: Jolaine Artist, MD;  Location: [facility] CATH LAB;  Service: Cardiovascular;  Laterality: N/A;  . TEE WITHOUT CARDIOVERSION N/A 01/05/2015   Procedure: TRANSESOPHAGEAL ECHOCARDIOGRAM (TEE);  Surgeon: Larey Dresser, MD;  Location: [place];  Service: Cardiovascular;  Laterality: N/A;  . WISDOM TOOTH EXTRACTION      Current Outpatient Medications  Medication Sig Dispense Refill  . acetaminophen (TYLENOL) 325 MG tablet Take 2 tablets (650 mg total) by mouth every 6 (six) hours as needed for mild pain (or Fever >/= 101).    [name] allopurinol (ZYLOPRIM) 300 MG tablet Take 300 mg by mouth daily.    [name] ALPRAZolam (XANAX) 0.25 MG tablet Take 1 tablet (0.25 mg total) by mouth every 12 (twelve) hours as needed for anxiety or sleep. 60 tablet 5  . atorvastatin (LIPITOR) 10 MG tablet TAKE 1 TABLET DAILY 90 tablet 2  . Biotin 5000 MCG CAPS Take 1 capsule by mouth daily.    . hydroxychloroquine (PLAQUENIL) 200 MG tablet Take 200 mg by mouth 2 (two) times daily.    . metoprolol succinate (TOPROL-XL) 25 MG 24 hr tablet Take 1 tablet (25 mg total) by mouth 2 (two) times daily. 10 tablet 0  . neomycin-polymyxin b-dexamethasone (MAXITROL) 3.5-10000-0.1 OINT Place 1 application into both eyes at bedtime.    . Omega-3 Fatty Acids (FISH OIL) 1200 MG CAPS Take 1,200 mg by mouth daily.    . OXYGEN Inhale 3-6 L into the lungs continuous.     . potassium chloride (K-DUR,KLOR-CON) 10 MEQ tablet Take 61meq in the AM and 44meq in the PM 270 tablet 3  . torsemide (DEMADEX) 20 MG tablet Take 3 tablets (60 mg total) by mouth 2 (two) times daily. 180 tablet 3   No current facility-administered medications for this visit.     Allergies as of 07/26/2017  . (No Known Allergies)     Family History  Problem Relation Age of Onset  . Heart attack Mother   . Prostate cancer Father   . Diabetes Brother   . Kidney failure Brother   . Breast cancer Maternal Aunt   . Colon cancer Neg Hx   . Stomach cancer Neg Hx     Social History   Socioeconomic History  . Marital status: Legally Separated    Spouse name: Not on file  . Number of children: 1  . Years of education: Not on file  . Highest education level: Not on file  Social Needs  . Financial resource strain: Not on file  . Food insecurity - worry: Not on file  . Food insecurity - inability:  Not on file  . Transportation needs - medical: Not on file  . Transportation needs - non-medical: Not on file  Occupational History  . Occupation: Disability    Comment: Office Work  Tobacco Use  . Smoking status: Former Smoker    Packs/day: 1.50    Years: 14.00    Pack years: 21.00    Types: Cigarettes    Last attempt to quit: 08/09/2003    Years since quitting: 13.9  . Smokeless tobacco: Never Used  Substance and Sexual Activity  . Alcohol use: Yes    Alcohol/week: 0.0 oz    Comment: 02/20/2016 "drank a little in my teens"  . Drug use: No  . Sexual activity: No  Other Topics Concern  . Not on file  Social History Narrative  . Not on file    Review of Systems:    Constitutional: No weight loss, fever or chills Skin: No rash Cardiovascular: No chest pain Respiratory: Chronic SOB Gastrointestinal: See HPI and otherwise negative Genitourinary: No dysuria  Neurological: No headache, dizziness or syncope Musculoskeletal: No new muscle or joint pain Hematologic: No bruising Psychiatric: No history of depression or anxiety   Physical Exam:  Vital signs: BP 112/72   Pulse (!) 56   Ht 5\' 4"  (1.626 m)   Wt 187 lb 6.4 oz (85 kg)   BMI 32.17 kg/m    Constitutional:   Pleasant AA female appears to be in NAD, Well developed, Well nourished, alert and cooperative on O2 via [place] Respiratory: Respirations even  and unlabored. Lungs clear to auscultation bilaterally.   No wheezes, crackles, or rhonchi.  Cardiovascular: Normal S1, S2. No MRG. Regular rate and rhythm. No peripheral edema, cyanosis or pallor.  Gastrointestinal:  Soft, nondistended, nontender. No rebound or guarding. Normal bowel sounds. No appreciable masses or hepatomegaly. Rectal:  External: brown stool residue; Internal: no ttp or fullness, no mass; Anoscopy: thrombosed internal hemorrhoid, soft, stigmata of recent bleeding Psychiatric:  Demonstrates good judgement and reason without abnormal affect or behaviors.  RELEVANT LABS AND IMAGING: CBC    Component Value Date/Time   WBC 3.7 (L) 06/11/2017 0810   WBC 8.9 07/23/2016 0507   RBC 3.56 (L) 06/11/2017 0810   RBC 3.48 (L) 07/23/2016 0507   HGB 10.5 (L) 06/11/2017 0810   HCT 33.9 (L) 06/11/2017 0810   PLT 66 (L) 06/11/2017 0810   MCV 95.3 06/11/2017 0810   MCH 29.4 06/11/2017 0810   MCH 29.3 07/23/2016 0507   MCHC 30.9 (L) 06/11/2017 0810   MCHC 31.1 07/23/2016 0507   RDW 16.6 (H) 06/11/2017 0810   LYMPHSABS 0.8 (L) 06/11/2017 0810   MONOABS 0.3 06/11/2017 0810   EOSABS 0.0 06/11/2017 0810   BASOSABS 0.0 06/11/2017 0810    CMP     Component Value Date/Time   NA 143 06/11/2017 0809   K 4.0 06/11/2017 0809   CL 97 (L) 07/23/2016 0507   CO2 28 06/11/2017 0809   GLUCOSE 114 06/11/2017 0809   BUN 35.6 (H) 06/11/2017 0809   CREATININE 2.2 (H) 06/11/2017 0809   CALCIUM 10.0 06/11/2017 0809   PROT 7.7 06/11/2017 0809   ALBUMIN 3.0 (L) 06/11/2017 0809   AST 24 06/11/2017 0809   ALT 21 06/11/2017 0809   ALKPHOS 152 (H) 06/11/2017 0809   BILITOT 0.64 06/11/2017 0809   GFRNONAA 17 (L) 07/23/2016 0507   GFRAA 19 (L) 07/23/2016 0507    Assessment: 1.  Rectal bleeding: One episode of "quite a lot" of bright red  blood in the toilet, a small amount continued on sanitary pad over the next day, over the past 48 hours she has seen no further bleeding and denies any abdominal  or rectal pain; anoscopy reveals stigmata of recent bleeding from thrombosed internal hemorrhoid, this is thought likely cause 2.  Chronic anemia: Full workup including colonoscopy, EGD, small bowel enteroscopy and pill capsule endoscopy which were all unrevealing last year, anemia thought related to chronic disease 3. Internal hemorrhoids  Plan: 1.  Discussed internal hemorrhoids with the patient today.  She does admit that she sometimes strains to have a bowel movement.  She asked what she can do for this today. 2.  Recommend patient increase fiber in her diet to 25-35 g a day with use of fiber supplement and/or through her diet.  We did discuss Metamucil and Citrucel. She would like to try prunes first. 3.  If patient would not like to try a laxative she can try MiraLAX 1 dose per day titrated to a soft formed bowel movement 4.  If she sees a large amount of bleeding again and this continues over 2448-hour period or she starts to have symptoms of increased shortness of breath, syncope, abdominal or rectal pain, she should call our clinic and or proceed to the ER. 5.  Patient to return to clinic as needed with Dr. Henrene Pastor or myself  Ellouise Newer, PA-C [facility] 07/26/2017, 8:41 AM  Cc: Lawerance Cruel, MD

## 2017-07-26 NOTE — Patient Instructions (Signed)
Try Prune 2-3 times a day.   When you run out of prunes buy either Citrucel or Metamucil and use 1 tbsp daily mixed in water or juice.   If you would like to try a laxative instead, you can use Miralax 1 dose daily titrate as needed.

## 2017-07-31 ENCOUNTER — Other Ambulatory Visit (HOSPITAL_COMMUNITY): Payer: Self-pay | Admitting: *Deleted

## 2017-07-31 MED ORDER — TORSEMIDE 20 MG PO TABS: 60.0000 mg | ORAL_TABLET | Freq: Two times a day (BID) | ORAL | 3 refills | Status: DC

## 2017-09-11 ENCOUNTER — Other Ambulatory Visit: Payer: Medicare Other

## 2017-09-11 ENCOUNTER — Ambulatory Visit: Payer: Medicare Other | Admitting: Hematology

## 2017-10-28 ENCOUNTER — Other Ambulatory Visit (HOSPITAL_COMMUNITY): Payer: Self-pay | Admitting: *Deleted

## 2017-10-28 DIAGNOSIS — I158 Other secondary hypertension: Secondary | ICD-10-CM

## 2017-10-28 MED ORDER — METOPROLOL SUCCINATE ER 25 MG PO TB24: 25.0000 mg | ORAL_TABLET | Freq: Two times a day (BID) | ORAL | 3 refills | Status: DC

## 2017-10-28 MED ORDER — ATORVASTATIN CALCIUM 10 MG PO TABS
10.0000 mg | ORAL_TABLET | Freq: Every day | ORAL | 2 refills | Status: AC
Start: 2017-10-28 — End: ?

## 2017-11-12 ENCOUNTER — Encounter (HOSPITAL_COMMUNITY): Payer: Self-pay | Admitting: Cardiology

## 2017-11-12 ENCOUNTER — Ambulatory Visit (HOSPITAL_COMMUNITY)
Admission: RE | Admit: 2017-11-12 | Discharge: 2017-11-12 | Disposition: A | Discharge status: Home / Self Care | Payer: Medicare Other | Source: Ambulatory Visit | Attending: Cardiology | Admitting: Cardiology

## 2017-11-12 ENCOUNTER — Other Ambulatory Visit: Payer: Self-pay

## 2017-11-12 VITALS — BP 123/63 | HR 62 | Wt 189.5 lb

## 2017-11-12 DIAGNOSIS — I481 Persistent atrial fibrillation: Secondary | ICD-10-CM | POA: Diagnosis not present

## 2017-11-12 DIAGNOSIS — Z9989 Dependence on other enabling machines and devices: Secondary | ICD-10-CM

## 2017-11-12 DIAGNOSIS — G4733 Obstructive sleep apnea (adult) (pediatric): Secondary | ICD-10-CM | POA: Insufficient documentation

## 2017-11-12 DIAGNOSIS — J449 Chronic obstructive pulmonary disease, unspecified: Secondary | ICD-10-CM | POA: Diagnosis not present

## 2017-11-12 DIAGNOSIS — E785 Hyperlipidemia, unspecified: Secondary | ICD-10-CM | POA: Diagnosis not present

## 2017-11-12 DIAGNOSIS — N183 Chronic kidney disease, stage 3 unspecified: Secondary | ICD-10-CM

## 2017-11-12 DIAGNOSIS — Z79899 Other long term (current) drug therapy: Secondary | ICD-10-CM | POA: Insufficient documentation

## 2017-11-12 DIAGNOSIS — I5032 Chronic diastolic (congestive) heart failure: Secondary | ICD-10-CM | POA: Insufficient documentation

## 2017-11-12 DIAGNOSIS — I4819 Other persistent atrial fibrillation: Secondary | ICD-10-CM

## 2017-11-12 DIAGNOSIS — I340 Nonrheumatic mitral (valve) insufficiency: Secondary | ICD-10-CM | POA: Diagnosis not present

## 2017-11-12 DIAGNOSIS — M7989 Other specified soft tissue disorders: Secondary | ICD-10-CM | POA: Insufficient documentation

## 2017-11-12 DIAGNOSIS — M069 Rheumatoid arthritis, unspecified: Secondary | ICD-10-CM | POA: Insufficient documentation

## 2017-11-12 DIAGNOSIS — D696 Thrombocytopenia, unspecified: Secondary | ICD-10-CM | POA: Insufficient documentation

## 2017-11-12 DIAGNOSIS — I130 Hypertensive heart and chronic kidney disease with heart failure and stage 1 through stage 4 chronic kidney disease, or unspecified chronic kidney disease: Secondary | ICD-10-CM | POA: Insufficient documentation

## 2017-11-12 DIAGNOSIS — I482 Chronic atrial fibrillation: Secondary | ICD-10-CM | POA: Insufficient documentation

## 2017-11-12 DIAGNOSIS — N189 Chronic kidney disease, unspecified: Secondary | ICD-10-CM | POA: Insufficient documentation

## 2017-11-12 DIAGNOSIS — Z87891 Personal history of nicotine dependence: Secondary | ICD-10-CM | POA: Insufficient documentation

## 2017-11-12 DIAGNOSIS — D649 Anemia, unspecified: Secondary | ICD-10-CM | POA: Diagnosis not present

## 2017-11-12 DIAGNOSIS — Z9981 Dependence on supplemental oxygen: Secondary | ICD-10-CM | POA: Insufficient documentation

## 2017-11-12 DIAGNOSIS — J9610 Chronic respiratory failure, unspecified whether with hypoxia or hypercapnia: Secondary | ICD-10-CM | POA: Diagnosis not present

## 2017-11-12 DIAGNOSIS — I272 Pulmonary hypertension, unspecified: Secondary | ICD-10-CM | POA: Diagnosis not present

## 2017-11-12 LAB — BASIC METABOLIC PANEL
Anion gap: 7 (ref 5–15)
BUN: 31 mg/dL — AB (ref 6–20)
CALCIUM: 9.9 mg/dL (ref 8.9–10.3)
CO2: 28 mmol/L (ref 22–32)
CREATININE: 2.13 mg/dL — AB (ref 0.44–1.00)
Chloride: 107 mmol/L (ref 101–111)
GFR calc Af Amer: 26 mL/min — ABNORMAL LOW (ref >60)
GFR, EST NON AFRICAN AMERICAN: 23 mL/min — AB (ref >60)
Glucose, Bld: 122 mg/dL — ABNORMAL HIGH (ref 65–99)
Potassium: 3.9 mmol/L (ref 3.5–5.1)
Sodium: 142 mmol/L (ref 135–145)

## 2017-11-12 LAB — CBC
HCT: 34.8 % — ABNORMAL LOW (ref 36.0–46.0)
Hemoglobin: 10.2 g/dL — ABNORMAL LOW (ref 12.0–15.0)
MCH: 28.9 pg (ref 26.0–34.0)
MCHC: 29.3 g/dL — AB (ref 30.0–36.0)
MCV: 98.6 fL (ref 78.0–100.0)
PLATELETS: 74 10*3/uL — AB (ref 150–400)
RBC: 3.53 MIL/uL — ABNORMAL LOW (ref 3.87–5.11)
RDW: 16.7 % — AB (ref 11.5–15.5)
WBC: 4.2 10*3/uL (ref 4.0–10.5)

## 2017-11-12 LAB — LIPID PANEL
Cholesterol: 105 mg/dL (ref 0–200)
HDL: 53 mg/dL (ref >40)
LDL Cholesterol: 43 mg/dL (ref 0–99)
TRIGLYCERIDES: 45 mg/dL (ref <150)
Total CHOL/HDL Ratio: 2 RATIO
VLDL: 9 mg/dL (ref 0–40)

## 2017-11-12 MED ORDER — TORSEMIDE 20 MG PO TABS: 80.0000 mg | ORAL_TABLET | Freq: Two times a day (BID) | ORAL | 3 refills | Status: DC

## 2017-11-12 MED ORDER — POTASSIUM CHLORIDE CRYS ER 10 MEQ PO TBCR: 20.0000 meq | EXTENDED_RELEASE_TABLET | Freq: Two times a day (BID) | ORAL | 3 refills | Status: DC

## 2017-11-12 MED ORDER — METOLAZONE 2.5 MG PO TABS: 2.5000 mg | ORAL_TABLET | Freq: Every day | ORAL | 0 refills | Status: DC

## 2017-11-12 NOTE — Patient Instructions (Addendum)
Start Metolazone 2.5 mg (1 tab) tomorrow with extra Potassium 20 meq (1 tab)  Increase Torsemide 80 mg (4 tabs), twice a day  Increase Potassium 20 meq (2 tabs), twice a day  Labs drawn today (if we do not call you, then your lab work was stable)   Your physician recommends that you return for lab work in: 1 week  Your physician recommends that you schedule a follow-up appointment in: 1 week with APP and lab  Your physician has requested that you have an echocardiogram. Echocardiography is a painless test that uses sound waves to create images of your heart. It provides your doctor with information about the size and shape of your heart and how well your heart's chambers and valves are working. This procedure takes approximately one hour. There are no restrictions for this procedure.  Your physician recommends that you schedule a follow-up appointment in: 8 weeks with Dr. Aundra Dubin

## 2017-11-12 NOTE — Progress Notes (Signed)
Patient ID: April Mueller, female   DOB: 20-Apr-1946, 72 y.o.   MRN: [id number]   HPI  PCP: Dr Harrington Challenger Pulmonary: Dr Byrum/Dr Melvyn Novas  Cardiology: Dr. Aundra Dubin  April Mueller is a 72 y.o. with COPD on home oxygen, rheumatoid arthritis, chronic atrial fibrillation, chronic diastolic CHF, CKD.    Moved back from [place] and saw Dr. Lamonte Sakai. In [place] was followed by cardiology and pulmonary. Started on Tracleer in 2006. Revatio was added, however was discontinued without any change in her symptoms. Stopped smoking in January 2005 when she had aortic aneurysm repair. Smoked 1.5-2 ppd x 15 - 20 years.  Later on macitentan.  I had her do a TEE in 6/16.  She had a loud MR murmur.  TEE showed very eccentric, posteriorly-directed MR that may be from subtle prolapse of the anterior mitral leaflet.  I am concerned that the MR could be severe but very difficult to fully visualize.  She additionally had PFTs done that were abnormal, but according to her pulmonologist Melvyn Novas), the obstruction was really only minimal and the restriction was primarily related to body habitus.  He does not think that we can explain her hypoxemia and dyspnea primarily from parenchymal lung pathology.  I sent her to [facility] for evaluation for percutaneous mitral valve clip.  They did not think that she was a good candidate.   She has been anemic and received 1 unit PRBCs in 8/16.  She saw GI and was noted to be heme negative, no scopes were done (heme negative and high risk). She has had iron infusions.   Opsumit was stopped in 9/16 due to suspicion for group 2 pulmonary hypertension only (low PVR) and she felt better off it.   She was admitted in 1/17 with increased dyspnea and was found to be profoundly anemic.  She had a total of 5 units PRBCs and an iron infusion in the hospital.  Stool was FOBT+, melena.  EGD, colonoscopy, and capsule endoscopy were all done without clear source of blood loss.  While in the hospital, she was noted to go into atrial  fibrillation with RVR.  She initially required amiodarone for rate control, but was transitioned over to Toprol XL. She was not anticoagulated due to concern for GI blood loss and anemia.  Anticoagulation was attempted later.   RHC in 3/17 showed elevated right heart filling pressures and mildly elevated PCWP.  There was mild pulmonary hypertension, likely pulmonary venous hypertension with PVR 1.9 WU.   She was admitted in 7/17 with symptomatic anemia, received 3 units PRBCs.  Anticoagulation was stopped.  Small bowel enteroscopy did not show a source of bleeding.    She was seen by Dr Rayann Heman, he recommended against Watchman. She is now off anticoagulation.  Has had Fe infusions and getting Aranesp.   Admitted 12/7 through 07/23/16. Treated for UTI, sepsis and volume overload. Discharged to [facility] then later home. Discharge weight  196 pounds.   She returns for follow up of CHF and atrial fibrillation. She remains in chronic atrial fibrillation. Weight is only up 2 lbs but she reports significantly increased dyspnea for the last 2 months.  She is ok in the house but gets short of breath walking any longer distances.  No orthopnea/PND.  No lightheadedness/dizziness.  No palpitations.  Feet/legs are more swollen.    Labs 02/05/13 K 3.8 Creatinine 1.37 05/08/13 K 3.8, Creatinine 1.58 11/14 K 4.1, creatinine 1.82 08/09/2014: K 4.0 Creatinine 1.45  5/16: K 3.7, creatinine  1.76, BNP 447 6/16: K 5 => 4.6, creatinine 2.09 => 2.13, BNP 291 8/16: K 4.7, creatinine 2.1, HCT 30.7 9/16: K 4.5, creatinine 2.3, HCT 34.1 1/17: K 4.8, creatinine 1.84 => 1.87, HCT 29.7, plts 136, FOBT+, BNP 403 2/17: K 3.5, creatinine 1.89, BNP 329, hgb 10 3/17: K 3.8, creatinine 2.5 => 2.3, HCT 35.4 => 33.5 5/17: K 3.7, creatinine 2.0, hgb 10.6 7/17: K 3.7, creatinine 2, hgb 9.9 8/17: K 3.8, creatinine 1.9, HCT 31.3 9/17: K 3.9, creatinine 2 07/23/2016: K 4.3 Creatinine 2.76  08/02/2016: K 4.7 Creatinine 2.0  4/18:  K 4, creatinine 2.3, hgb 10.7 8/18: K 4, creatinine 2.13, hgb 10.4, plts 83 9/18: LDL 59 11/18: K 4, creatinine 2.2 12/18: hgb 10.7  1) COPD: Former smoker (28 pk-yrs).  On home O2 - 4L/min rest, up to 5L/min exertion.  - spirometry 6/13 in [place] = 0.85L FVC = 1.3L no DLCO measured. FEV1 in 2014 = 0.73  - CT chest 10/14 - moderate centrilobular emphysema. No pulm fibrosis but there was evidence for post-infectious/inflammatory scarring. Stable Asc Ao repair - PFTs (11/14) FVC 46%, FEV1 0.8L (40%), ratio 87%, FEF 25-75% 0.44L DLCO 22%, TLC 50% => PFTs in 06/2013 did NOT suggest significant airflow obstruction according to Dr Gustavus Bryant last note despite "moderate emphysema" on CT.  - PFTs (6/16) were abnormal with severe obstruction, severe restriction, severely decreased DLCO.  Reviewed with Dr Melvyn Novas => he thinks obstruction is actually fairly minimal and that the restriction is due to body habitus.   2) Obesity 3) RA: On plaquenil, sees Dr Lenna Gilford.  4) Type A aortic dissection s/p repair 2005: c/b renal failure requiring HD x 3 months 5) PAH - previously treated in [place].  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  - V/Q scan 11/14 no chronic PE - 2015 Tracleer stopped and started on macitentan. 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  - RHC 08/18/2014: No role for pulmonary vasodilators.  RA = 14 RV = 62/3/15 PA = 63/21 (38) PCW = mean 23 v waves to 40 Fick cardiac output/index = 6.3/3.2 Thermodilution CO/CI = 6.1/3.1, PVR = 2.0 WU, FA sat = 91% PA sat = 57%, 62% - RHC 3/17: mean RA 13, PA 44/2 mean 29, mean PCWP 17, CI 3.04, PVR 1.9 WU => pulmonary venous hypertension. 6) OSA 7) Diastolic CHF - ECHO 8/46 EF 55-60% RV mildly dilated with normal function. Mild to moderate posterior MR. RVSP 56. Probable restrictive diastolic filling pattern - ECHO 9/15 EF 60-65% RV mildly dilated with normal function. Mild to moderate posterior MR. RVSP 40. Ao Root ok - TEE 6/16 with EF  55-60%, D-shaped interventricular septum suggestive of RV pressure/volume overload, RV mildly dilated with normal systolic function, peak RV-RA gradient 57 mmHg, very eccentric posteriorly-directed mitral regurgitation, possibly severe, etiology may be a very small area of prolapse on the anterior leaflet, s/p ascending aorta repair with residual dissection flap in the descending thoracic aorta.   - Echo (1/17) with EF 55-60%, mild to moderate MR, PASP 66 - Echo (12/17) with EF 55-60%, grade II diastolic dysfunction, mild MR, mildly decreased RV systolic function, PASP 66 mmHg.  8) CKD 9) Mitral regurgitation: Possibly severe by 6/16 TEE, very eccentric.  Not good candidate for percutaneous MV clip (seen at [facility]).  Echo (1/17) with only mild to moderate MR.  10) Anemia: Suspect from chronic disease/renal disease and well as chronic GI bleeding.  EGD/colonoscopy/capsule endoscopy in 1/17 did not show  any definite site for blood loss.  Small bowel enteroscopy in 7/17 did not show etiology of bleeding. 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  Getting Fe infusions.  11) Atrial fibrillation: Persistent, 1st noted in 1/17.  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   ROS: All systems reviewed and negative except as per HPI.  Current Outpatient Medications on File Prior to Encounter  Medication Sig Dispense Refill  . acetaminophen (TYLENOL) 325 MG tablet Take 2 tablets (650 mg total) by mouth every 6 (six) hours as needed for mild pain (or Fever >/= 101).    [name] allopurinol (ZYLOPRIM) 300 MG tablet Take 300 mg by mouth daily.    [name] ALPRAZolam (XANAX) 0.25 MG tablet Take 1 tablet (0.25 mg total) by mouth every 12 (twelve) hours as needed for anxiety or sleep. 60 tablet 5  . atorvastatin (LIPITOR) 10 MG tablet Take 1 tablet (10 mg total) by mouth daily. 90 tablet 2  . Biotin 5000 MCG CAPS Take 1 capsule by mouth daily.    . cephALEXin (KEFLEX) 500 MG capsule Take 500 mg by mouth 3 (three)  times daily.    . hydroxychloroquine (PLAQUENIL) 200 MG tablet Take 200 mg by mouth 2 (two) times daily.    . metoprolol succinate (TOPROL-XL) 25 MG 24 hr tablet Take 1 tablet (25 mg total) by mouth 2 (two) times daily. 180 tablet 3  . neomycin-polymyxin b-dexamethasone (MAXITROL) 3.5-10000-0.1 OINT Place 1 application into both eyes at bedtime.    . Omega-3 Fatty Acids (FISH OIL) 1200 MG CAPS Take 1,200 mg by mouth daily.    . OXYGEN Inhale 3-6 L into the lungs continuous.      No current facility-administered medications on file prior to encounter.    Social History   Socioeconomic History  . Marital status: Legally Separated    Spouse name: Not on file  . Number of children: 1  . Years of education: Not on file  . Highest education level: Not on file  Occupational History  . Occupation: Disability    Comment: Office Work  Scientific laboratory technician  . Financial resource strain: Not on file  . Food insecurity:    Worry: Not on file    Inability: Not on file  . Transportation needs:    Medical: Not on file    Non-medical: Not on file  Tobacco Use  . Smoking status: Former Smoker    Packs/day: 1.50    Years: 14.00    Pack years: 21.00    Types: Cigarettes    Last attempt to quit: 08/09/2003    Years since quitting: 14.2  . Smokeless tobacco: Never Used  Substance and Sexual Activity  . Alcohol use: Yes    Alcohol/week: 0.0 oz    Comment: 02/20/2016 "drank a little in my teens"  . Drug use: No  . Sexual activity: Never  Lifestyle  . Physical activity:    Days per week: Not on file    Minutes per session: Not on file  . Stress: Not on file  Relationships  . Social connections:    Talks on phone: Not on file    Gets together: Not on file    Attends religious service: Not on file    Active member of club or organization: Not on file    Attends meetings of clubs or organizations: Not on file    Relationship status: Not on file  Other Topics Concern  . Not on file  Social History  Narrative  . Not on  file   Family History  Problem Relation Age of Onset  . Heart attack Mother   . Prostate cancer Father   . Diabetes Brother   . Kidney failure Brother   . Breast cancer Maternal Aunt   . Colon cancer Neg Hx   . Stomach cancer Neg Hx     Vitals:   11/12/17 0914  BP: 123/63  Pulse: 62  SpO2: 93%  Weight: 189 lb 8 oz (86 kg)   General: NAD Neck: JVP 12 cm, no thyromegaly or thyroid nodule.  Lungs: Clear to auscultation bilaterally with normal respiratory effort. CV: Nondisplaced PMI.  Heart irregular S1/S2, no S3/S4, 3/6 HSM apex/LLSB.  2+ edema to knees bilaterally.  No carotid bruit.  Normal pedal pulses.  Abdomen: Soft, nontender, no hepatosplenomegaly, no distention.  Skin: Intact without lesions or rashes.  Neurologic: Alert and oriented x 3.  Psych: Normal affect. Extremities: No clubbing or cyanosis.  HEENT: Normal.    Assessment/Plan: 1. Chronic diastolic CHF with pulmonary hypertension/RV failure: NYHA III. Weight is up and she looks volume overloaded on exam.  - Increase torsemide to 80 mg bid with BMET today and in 1 week.   - She will take metolazone 2.5 mg x 1 tomorrow along with an extra KCL 20.  - I will have her wear graded compression stockings during the day.  - I will obtain and echocardiogram.    2. Chronic respiratory failure:  She is on 3 liters at rest, 4-5 L with exertion home oxygen by [place]. She has COPD and restriction from her body habitus (OHS).  3. OSA: Continue CPAP nightly.  4. CKD: Stage III-IV. Creatinine baseline 2-2.3. This has been stable 5. Pulmonary hypertension: This is primarily [place] group 2 (pulmonary venous hypertension) based on RHC in 3/17.  She is not a good candidate for pulmonary vasodilators.   6. Mitral regurgitation:  Last echo in 07/2016 mild MR.  - Has MR murmur, as above will repeat echo.  7. Anemia: Suspect anemia of chronic disease/renal disease but also with component of GI blood loss.  She has had GI  bleeding with FOBT+, but unable to visualize bleeding source on EGD, colonoscopy, enteroscopy, or capsule endoscopy (most recently in 7/17).  Seeing hematology (Dr. Irene Limbo), getting Aranesp and Fe infusions with Fe deficiency and possible MDS.  - CBC today.  8. Atrial fibrillation: Persistent.  Rate is controlled.  CHADSVASC = 3.  She was evaluated for Watchman but decided not to be a good candidate. Due to recurrent severe GI bleeding, anticoagulation was stopped altogether. No stroke-like symptoms.  9. 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   Followup in 1 week with NP/PA, see me in 1 month.   [name] MD 11/12/2017

## 2017-11-12 NOTE — H&P (View-Only) (Signed)
Patient ID: April Mueller, female   DOB: [birth year], 72 y.o.   MRN: [id number]   HPI  PCP: Dr Harrington Challenger Pulmonary: Dr Byrum/Dr Melvyn Novas  Cardiology: Dr. Aundra Dubin  April Mueller is a 72 y.o. with COPD on home oxygen, rheumatoid arthritis, chronic atrial fibrillation, chronic diastolic CHF, CKD.    Moved back from [place] and saw Dr. Lamonte Sakai. In [place] was followed by cardiology and pulmonary. Started on Tracleer in 2006. Revatio was added, however was discontinued without any change in her symptoms. Stopped smoking in January 2005 when she had aortic aneurysm repair. Smoked 1.5-2 ppd x 15 - 20 years.  Later on macitentan.  I had her do a TEE in 6/16.  She had a loud MR murmur.  TEE showed very eccentric, posteriorly-directed MR that may be from subtle prolapse of the anterior mitral leaflet.  I am concerned that the MR could be severe but very difficult to fully visualize.  She additionally had PFTs done that were abnormal, but according to her pulmonologist Melvyn Novas), the obstruction was really only minimal and the restriction was primarily related to body habitus.  He does not think that we can explain her hypoxemia and dyspnea primarily from parenchymal lung pathology.  I sent her to [facility] for evaluation for percutaneous mitral valve clip.  They did not think that she was a good candidate.   She has been anemic and received 1 unit PRBCs in 8/16.  She saw GI and was noted to be heme negative, no scopes were done (heme negative and high risk). She has had iron infusions.   Opsumit was stopped in 9/16 due to suspicion for group 2 pulmonary hypertension only (low PVR) and she felt better off it.   She was admitted in 1/17 with increased dyspnea and was found to be profoundly anemic.  She had a total of 5 units PRBCs and an iron infusion in the hospital.  Stool was FOBT+, melena.  EGD, colonoscopy, and capsule endoscopy were all done without clear source of blood loss.  While in the hospital, she was noted to go into atrial  fibrillation with RVR.  She initially required amiodarone for rate control, but was transitioned over to Toprol XL. She was not anticoagulated due to concern for GI blood loss and anemia.  Anticoagulation was attempted later.   RHC in 3/17 showed elevated right heart filling pressures and mildly elevated PCWP.  There was mild pulmonary hypertension, likely pulmonary venous hypertension with PVR 1.9 WU.   She was admitted in 7/17 with symptomatic anemia, received 3 units PRBCs.  Anticoagulation was stopped.  Small bowel enteroscopy did not show a source of bleeding.    She was seen by Dr Rayann Heman, he recommended against Watchman. She is now off anticoagulation.  Has had Fe infusions and getting Aranesp.   Admitted 12/7 through 07/23/16. Treated for UTI, sepsis and volume overload. Discharged to [facility] then later home. Discharge weight  196 pounds.   She returns for follow up of CHF and atrial fibrillation. She remains in chronic atrial fibrillation. Weight is only up 2 lbs but she reports significantly increased dyspnea for the last 2 months.  She is ok in the house but gets short of breath walking any longer distances.  No orthopnea/PND.  No lightheadedness/dizziness.  No palpitations.  Feet/legs are more swollen.    Labs 02/05/13 K 3.8 Creatinine 1.37 05/08/13 K 3.8, Creatinine 1.58 11/14 K 4.1, creatinine 1.82 08/09/2014: K 4.0 Creatinine 1.45  5/16: K 3.7, creatinine  1.76, BNP 447 6/16: K 5 => 4.6, creatinine 2.09 => 2.13, BNP 291 8/16: K 4.7, creatinine 2.1, HCT 30.7 9/16: K 4.5, creatinine 2.3, HCT 34.1 1/17: K 4.8, creatinine 1.84 => 1.87, HCT 29.7, plts 136, FOBT+, BNP 403 2/17: K 3.5, creatinine 1.89, BNP 329, hgb 10 3/17: K 3.8, creatinine 2.5 => 2.3, HCT 35.4 => 33.5 5/17: K 3.7, creatinine 2.0, hgb 10.6 7/17: K 3.7, creatinine 2, hgb 9.9 8/17: K 3.8, creatinine 1.9, HCT 31.3 9/17: K 3.9, creatinine 2 07/23/2016: K 4.3 Creatinine 2.76  08/02/2016: K 4.7 Creatinine 2.0  4/18:  K 4, creatinine 2.3, hgb 10.7 8/18: K 4, creatinine 2.13, hgb 10.4, plts 83 9/18: LDL 59 11/18: K 4, creatinine 2.2 12/18: hgb 10.7  1) COPD: Former smoker (28 pk-yrs).  On home O2 - 4L/min rest, up to 5L/min exertion.  - spirometry 6/13 in [place] = 0.85L FVC = 1.3L no DLCO measured. FEV1 in 2014 = 0.73  - CT chest 10/14 - moderate centrilobular emphysema. No pulm fibrosis but there was evidence for post-infectious/inflammatory scarring. Stable Asc Ao repair - PFTs (11/14) FVC 46%, FEV1 0.8L (40%), ratio 87%, FEF 25-75% 0.44L DLCO 22%, TLC 50% => PFTs in 06/2013 did NOT suggest significant airflow obstruction according to Dr Gustavus Bryant last note despite "moderate emphysema" on CT.  - PFTs (6/16) were abnormal with severe obstruction, severe restriction, severely decreased DLCO.  Reviewed with Dr Melvyn Novas => he thinks obstruction is actually fairly minimal and that the restriction is due to body habitus.   2) Obesity 3) RA: On plaquenil, sees Dr Lenna Gilford.  4) Type A aortic dissection s/p repair 2005: c/b renal failure requiring HD x 3 months 5) PAH - previously treated in [place].  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  - V/Q scan 11/14 no chronic PE - 2015 Tracleer stopped and started on macitentan. 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  - RHC 08/18/2014: No role for pulmonary vasodilators.  RA = 14 RV = 62/3/15 PA = 63/21 (38) PCW = mean 23 v waves to 40 Fick cardiac output/index = 6.3/3.2 Thermodilution CO/CI = 6.1/3.1, PVR = 2.0 WU, FA sat = 91% PA sat = 57%, 62% - RHC 3/17: mean RA 13, PA 44/2 mean 29, mean PCWP 17, CI 3.04, PVR 1.9 WU => pulmonary venous hypertension. 6) OSA 7) Diastolic CHF - ECHO 1/61 EF 55-60% RV mildly dilated with normal function. Mild to moderate posterior MR. RVSP 56. Probable restrictive diastolic filling pattern - ECHO 9/15 EF 60-65% RV mildly dilated with normal function. Mild to moderate posterior MR. RVSP 40. Ao Root ok - TEE 6/16 with EF  55-60%, D-shaped interventricular septum suggestive of RV pressure/volume overload, RV mildly dilated with normal systolic function, peak RV-RA gradient 57 mmHg, very eccentric posteriorly-directed mitral regurgitation, possibly severe, etiology may be a very small area of prolapse on the anterior leaflet, s/p ascending aorta repair with residual dissection flap in the descending thoracic aorta.   - Echo (1/17) with EF 55-60%, mild to moderate MR, PASP 66 - Echo (12/17) with EF 55-60%, grade II diastolic dysfunction, mild MR, mildly decreased RV systolic function, PASP 66 mmHg.  8) CKD 9) Mitral regurgitation: Possibly severe by 6/16 TEE, very eccentric.  Not good candidate for percutaneous MV clip (seen at [facility]).  Echo (1/17) with only mild to moderate MR.  10) Anemia: Suspect from chronic disease/renal disease and well as chronic GI bleeding.  EGD/colonoscopy/capsule endoscopy in 1/17 did not show  any definite site for blood loss.  Small bowel enteroscopy in 7/17 did not show etiology of bleeding. 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  Getting Fe infusions.  11) Atrial fibrillation: Persistent, 1st noted in 1/17.  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   ROS: All systems reviewed and negative except as per HPI.  Current Outpatient Medications on File Prior to Encounter  Medication Sig Dispense Refill  . acetaminophen (TYLENOL) 325 MG tablet Take 2 tablets (650 mg total) by mouth every 6 (six) hours as needed for mild pain (or Fever >/= 101).    [name] allopurinol (ZYLOPRIM) 300 MG tablet Take 300 mg by mouth daily.    [name] ALPRAZolam (XANAX) 0.25 MG tablet Take 1 tablet (0.25 mg total) by mouth every 12 (twelve) hours as needed for anxiety or sleep. 60 tablet 5  . atorvastatin (LIPITOR) 10 MG tablet Take 1 tablet (10 mg total) by mouth daily. 90 tablet 2  . Biotin 5000 MCG CAPS Take 1 capsule by mouth daily.    . cephALEXin (KEFLEX) 500 MG capsule Take 500 mg by mouth 3 (three)  times daily.    . hydroxychloroquine (PLAQUENIL) 200 MG tablet Take 200 mg by mouth 2 (two) times daily.    . metoprolol succinate (TOPROL-XL) 25 MG 24 hr tablet Take 1 tablet (25 mg total) by mouth 2 (two) times daily. 180 tablet 3  . neomycin-polymyxin b-dexamethasone (MAXITROL) 3.5-10000-0.1 OINT Place 1 application into both eyes at bedtime.    . Omega-3 Fatty Acids (FISH OIL) 1200 MG CAPS Take 1,200 mg by mouth daily.    . OXYGEN Inhale 3-6 L into the lungs continuous.      No current facility-administered medications on file prior to encounter.    Social History   Socioeconomic History  . Marital status: Legally Separated    Spouse name: Not on file  . Number of children: 1  . Years of education: Not on file  . Highest education level: Not on file  Occupational History  . Occupation: Disability    Comment: Office Work  Scientific laboratory technician  . Financial resource strain: Not on file  . Food insecurity:    Worry: Not on file    Inability: Not on file  . Transportation needs:    Medical: Not on file    Non-medical: Not on file  Tobacco Use  . Smoking status: Former Smoker    Packs/day: 1.50    Years: 14.00    Pack years: 21.00    Types: Cigarettes    Last attempt to quit: 08/09/2003    Years since quitting: 14.2  . Smokeless tobacco: Never Used  Substance and Sexual Activity  . Alcohol use: Yes    Alcohol/week: 0.0 oz    Comment: 02/20/2016 "drank a little in my teens"  . Drug use: No  . Sexual activity: Never  Lifestyle  . Physical activity:    Days per week: Not on file    Minutes per session: Not on file  . Stress: Not on file  Relationships  . Social connections:    Talks on phone: Not on file    Gets together: Not on file    Attends religious service: Not on file    Active member of club or organization: Not on file    Attends meetings of clubs or organizations: Not on file    Relationship status: Not on file  Other Topics Concern  . Not on file  Social History  Narrative  . Not on  file   Family History  Problem Relation Age of Onset  . Heart attack Mother   . Prostate cancer Father   . Diabetes Brother   . Kidney failure Brother   . Breast cancer Maternal Aunt   . Colon cancer Neg Hx   . Stomach cancer Neg Hx     Vitals:   11/12/17 0914  BP: 123/63  Pulse: 62  SpO2: 93%  Weight: 189 lb 8 oz (86 kg)   General: NAD Neck: JVP 12 cm, no thyromegaly or thyroid nodule.  Lungs: Clear to auscultation bilaterally with normal respiratory effort. CV: Nondisplaced PMI.  Heart irregular S1/S2, no S3/S4, 3/6 HSM apex/LLSB.  2+ edema to knees bilaterally.  No carotid bruit.  Normal pedal pulses.  Abdomen: Soft, nontender, no hepatosplenomegaly, no distention.  Skin: Intact without lesions or rashes.  Neurologic: Alert and oriented x 3.  Psych: Normal affect. Extremities: No clubbing or cyanosis.  HEENT: Normal.    Assessment/Plan: 1. Chronic diastolic CHF with pulmonary hypertension/RV failure: NYHA III. Weight is up and she looks volume overloaded on exam.  - Increase torsemide to 80 mg bid with BMET today and in 1 week.   - She will take metolazone 2.5 mg x 1 tomorrow along with an extra KCL 20.  - I will have her wear graded compression stockings during the day.  - I will obtain and echocardiogram.    2. Chronic respiratory failure:  She is on 3 liters at rest, 4-5 L with exertion home oxygen by [address]. She has COPD and restriction from her body habitus (OHS).  3. OSA: Continue CPAP nightly.  4. CKD: Stage III-IV. Creatinine baseline 2-2.3. This has been stable 5. Pulmonary hypertension: This is primarily [place] group 2 (pulmonary venous hypertension) based on RHC in 3/17.  She is not a good candidate for pulmonary vasodilators.   6. Mitral regurgitation:  Last echo in 07/2016 mild MR.  - Has MR murmur, as above will repeat echo.  7. Anemia: Suspect anemia of chronic disease/renal disease but also with component of GI blood loss.  She has had GI  bleeding with FOBT+, but unable to visualize bleeding source on EGD, colonoscopy, enteroscopy, or capsule endoscopy (most recently in 7/17).  Seeing hematology (Dr. Irene Limbo), getting Aranesp and Fe infusions with Fe deficiency and possible MDS.  - CBC today.  8. Atrial fibrillation: Persistent.  Rate is controlled.  CHADSVASC = 3.  She was evaluated for Watchman but decided not to be a good candidate. Due to recurrent severe GI bleeding, anticoagulation was stopped altogether. No stroke-like symptoms.  9. 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   Followup in 1 week with NP/PA, see me in 1 month.   [name] MD 11/12/2017

## 2017-11-13 ENCOUNTER — Other Ambulatory Visit: Payer: Self-pay

## 2017-11-13 ENCOUNTER — Ambulatory Visit (HOSPITAL_COMMUNITY): Payer: Medicare Other | Attending: Internal Medicine

## 2017-11-13 DIAGNOSIS — G4733 Obstructive sleep apnea (adult) (pediatric): Secondary | ICD-10-CM | POA: Diagnosis not present

## 2017-11-13 DIAGNOSIS — Z87891 Personal history of nicotine dependence: Secondary | ICD-10-CM | POA: Insufficient documentation

## 2017-11-13 DIAGNOSIS — I110 Hypertensive heart disease with heart failure: Secondary | ICD-10-CM | POA: Insufficient documentation

## 2017-11-13 DIAGNOSIS — I071 Rheumatic tricuspid insufficiency: Secondary | ICD-10-CM | POA: Diagnosis not present

## 2017-11-13 DIAGNOSIS — J449 Chronic obstructive pulmonary disease, unspecified: Secondary | ICD-10-CM | POA: Insufficient documentation

## 2017-11-13 DIAGNOSIS — I5032 Chronic diastolic (congestive) heart failure: Secondary | ICD-10-CM | POA: Diagnosis not present

## 2017-11-13 DIAGNOSIS — Z8249 Family history of ischemic heart disease and other diseases of the circulatory system: Secondary | ICD-10-CM | POA: Diagnosis not present

## 2017-11-13 DIAGNOSIS — I4891 Unspecified atrial fibrillation: Secondary | ICD-10-CM | POA: Diagnosis not present

## 2017-11-13 DIAGNOSIS — I253 Aneurysm of heart: Secondary | ICD-10-CM | POA: Insufficient documentation

## 2017-11-13 DIAGNOSIS — M329 Systemic lupus erythematosus, unspecified: Secondary | ICD-10-CM | POA: Diagnosis not present

## 2017-11-14 ENCOUNTER — Encounter (HOSPITAL_COMMUNITY): Payer: Self-pay

## 2017-11-17 NOTE — Progress Notes (Signed)
Patient ID: April Mueller, female   DOB: Mar 15, 1946, 72 y.o.   MRN: [id number]    Advanced Heart Failure Clinic Note   HPI  PCP: Dr Harrington Challenger Pulmonary: Dr Byrum/Dr Melvyn Novas  Cardiology: Dr. Aundra Dubin  Ms Keithley is a 72 y.o. with COPD on home oxygen, rheumatoid arthritis, chronic atrial fibrillation, chronic diastolic CHF, CKD.    Moved back from [place] and saw Dr. Lamonte Sakai. In [place] was followed by cardiology and pulmonary. Started on Tracleer in 2006. Revatio was added, however was discontinued without any change in her symptoms. Stopped smoking in January 2005 when she had aortic aneurysm repair. Smoked 1.5-2 ppd x 15 - 20 years.  Later on macitentan.  TEE in 6/16.  She had a loud MR murmur.  TEE showed very eccentric, posteriorly-directed MR that may be from subtle prolapse of the anterior mitral leaflet.  I am concerned that the MR could be severe but very difficult to fully visualize.  She additionally had PFTs done that were abnormal, but according to her pulmonologist Melvyn Novas), the obstruction was really only minimal and the restriction was primarily related to body habitus.  He does not think that we can explain her hypoxemia and dyspnea primarily from parenchymal lung pathology.  I sent her to [facility] for evaluation for percutaneous mitral valve clip.  They did not think that she was a good candidate.   She has been anemic and received 1 unit PRBCs in 8/16.  She saw GI and was noted to be heme negative, no scopes were done (heme negative and high risk). She has had iron infusions.   Opsumit was stopped in 9/16 due to suspicion for group 2 pulmonary hypertension only (low PVR) and she felt better off it.   She was admitted in 1/17 with increased dyspnea and was found to be profoundly anemic.  She had a total of 5 units PRBCs and an iron infusion in the hospital.  Stool was FOBT+, melena.  EGD, colonoscopy, and capsule endoscopy were all done without clear source of blood loss.  While in the hospital, she was  noted to go into atrial fibrillation with RVR.  She initially required amiodarone for rate control, but was transitioned over to Toprol XL. She was not anticoagulated due to concern for GI blood loss and anemia.  Anticoagulation was attempted later.   RHC in 3/17 showed elevated right heart filling pressures and mildly elevated PCWP.  There was mild pulmonary hypertension, likely pulmonary venous hypertension with PVR 1.9 WU.   She was admitted in 7/17 with symptomatic anemia, received 3 units PRBCs.  Anticoagulation was stopped.  Small bowel enteroscopy did not show a source of bleeding.    She was seen by Dr Rayann Heman, he recommended against Watchman. She is now off anticoagulation.  Has had Fe infusions and getting Aranesp.   Admitted 12/7 through 07/23/16. Treated for UTI, sepsis and volume overload. Discharged to [facility] then later home. Discharge weight  196 pounds.   She presents today for 1 week follow up for CHF and Afib. Last week Torsemide increased. Her weight is down 6 lbs from last visit. She feels "much better". Breathing back to baseline. She is OK walking in the house and short distances. Denies orthopnea or PND. No lightheadedness or palpitations. Edema has greatly improved with compression hose.   Labs 02/05/13 K 3.8 Creatinine 1.37 05/08/13 K 3.8, Creatinine 1.58 11/14 K 4.1, creatinine 1.82 08/09/2014: K 4.0 Creatinine 1.45  5/16: K 3.7, creatinine 1.76, BNP 447  6/16: K 5 => 4.6, creatinine 2.09 => 2.13, BNP 291 8/16: K 4.7, creatinine 2.1, HCT 30.7 9/16: K 4.5, creatinine 2.3, HCT 34.1 1/17: K 4.8, creatinine 1.84 => 1.87, HCT 29.7, plts 136, FOBT+, BNP 403 2/17: K 3.5, creatinine 1.89, BNP 329, hgb 10 3/17: K 3.8, creatinine 2.5 => 2.3, HCT 35.4 => 33.5 5/17: K 3.7, creatinine 2.0, hgb 10.6 7/17: K 3.7, creatinine 2, hgb 9.9 8/17: K 3.8, creatinine 1.9, HCT 31.3 9/17: K 3.9, creatinine 2 07/23/2016: K 4.3 Creatinine 2.76  08/02/2016: K 4.7 Creatinine 2.0  4/18: K  4, creatinine 2.3, hgb 10.7 8/18: K 4, creatinine 2.13, hgb 10.4, plts 83 9/18: LDL 59 11/18: K 4, creatinine 2.2 12/18: hgb 10.7  1) COPD: Former smoker (28 pk-yrs).  On home O2 - 4L/min rest, up to 5L/min exertion.  - spirometry 6/13 in [place] = 0.85L FVC = 1.3L no DLCO measured. FEV1 in 2014 = 0.73  - CT chest 10/14 - moderate centrilobular emphysema. No pulm fibrosis but there was evidence for post-infectious/inflammatory scarring. Stable Asc Ao repair - PFTs (11/14) FVC 46%, FEV1 0.8L (40%), ratio 87%, FEF 25-75% 0.44L DLCO 22%, TLC 50% => PFTs in 06/2013 did NOT suggest significant airflow obstruction according to Dr Gustavus Bryant last note despite "moderate emphysema" on CT.  - PFTs (6/16) were abnormal with severe obstruction, severe restriction, severely decreased DLCO.  Reviewed with Dr Melvyn Novas => he thinks obstruction is actually fairly minimal and that the restriction is due to body habitus.   2) Obesity 3) RA: On plaquenil, sees Dr Lenna Gilford.  4) Type A aortic dissection s/p repair 2005: c/b renal failure requiring HD x 3 months 5) PAH - previously treated in [place].  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  - V/Q scan 11/14 no chronic PE - 2015 Tracleer stopped and started on macitentan. 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  - RHC 08/18/2014: No role for pulmonary vasodilators.  RA = 14 RV = 62/3/15 PA = 63/21 (38) PCW = mean 23 v waves to 40 Fick cardiac output/index = 6.3/3.2 Thermodilution CO/CI = 6.1/3.1, PVR = 2.0 WU, FA sat = 91% PA sat = 57%, 62% - RHC 3/17: mean RA 13, PA 44/2 mean 29, mean PCWP 17, CI 3.04, PVR 1.9 WU => pulmonary venous hypertension. 6) OSA 7) Diastolic CHF - ECHO 3/15 EF 55-60% RV mildly dilated with normal function. Mild to moderate posterior MR. RVSP 56. Probable restrictive diastolic filling pattern - ECHO 9/15 EF 60-65% RV mildly dilated with normal function. Mild to moderate posterior MR. RVSP 40. Ao Root ok - TEE 6/16 with EF  55-60%, D-shaped interventricular septum suggestive of RV pressure/volume overload, RV mildly dilated with normal systolic function, peak RV-RA gradient 57 mmHg, very eccentric posteriorly-directed mitral regurgitation, possibly severe, etiology may be a very small area of prolapse on the anterior leaflet, s/p ascending aorta repair with residual dissection flap in the descending thoracic aorta.   - Echo (1/17) with EF 55-60%, mild to moderate MR, PASP 66 - Echo (12/17) with EF 55-60%, grade II diastolic dysfunction, mild MR, mildly decreased RV systolic function, PASP 66 mmHg.  8) CKD 9) Mitral regurgitation: Possibly severe by 6/16 TEE, very eccentric.  Not good candidate for percutaneous MV clip (seen at [facility]).  Echo (1/17) with only mild to moderate MR.  10) Anemia: Suspect from chronic disease/renal disease and well as chronic GI bleeding.  EGD/colonoscopy/capsule endoscopy in 1/17 did not show any definite site  for blood loss.  Small bowel enteroscopy in 7/17 did not show etiology of bleeding. 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  Getting Fe infusions.  11) Atrial fibrillation: Persistent, 1st noted in 1/17.  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   Review of systems complete and found to be negative unless listed in HPI.    Current Outpatient Medications on File Prior to Encounter  Medication Sig Dispense Refill  . acetaminophen (TYLENOL) 325 MG tablet Take 2 tablets (650 mg total) by mouth every 6 (six) hours as needed for mild pain (or Fever >/= 101).    [name] allopurinol (ZYLOPRIM) 300 MG tablet Take 300 mg by mouth daily.    [name] ALPRAZolam (XANAX) 0.25 MG tablet Take 1 tablet (0.25 mg total) by mouth every 12 (twelve) hours as needed for anxiety or sleep. 60 tablet 5  . atorvastatin (LIPITOR) 10 MG tablet Take 1 tablet (10 mg total) by mouth daily. 90 tablet 2  . Biotin 5000 MCG CAPS Take 1 capsule by mouth daily.    . cephALEXin (KEFLEX) 500 MG capsule Take 500 mg  by mouth 3 (three) times daily.    . hydroxychloroquine (PLAQUENIL) 200 MG tablet Take 200 mg by mouth 2 (two) times daily.    . metolazone (ZAROXOLYN) 2.5 MG tablet Take 1 tablet (2.5 mg total) by mouth daily. Take with extra Potassium 20 meq 1 tablet 0  . metoprolol succinate (TOPROL-XL) 25 MG 24 hr tablet Take 1 tablet (25 mg total) by mouth 2 (two) times daily. 180 tablet 3  . neomycin-polymyxin b-dexamethasone (MAXITROL) 3.5-10000-0.1 OINT Place 1 application into both eyes at bedtime.    . Omega-3 Fatty Acids (FISH OIL) 1200 MG CAPS Take 1,200 mg by mouth daily.    . OXYGEN Inhale 3-6 L into the lungs continuous.     . potassium chloride (K-DUR,KLOR-CON) 10 MEQ tablet Take 2 tablets (20 mEq total) by mouth 2 (two) times daily. 120 tablet 3  . torsemide (DEMADEX) 20 MG tablet Take 4 tablets (80 mg total) by mouth 2 (two) times daily. 240 tablet 3   No current facility-administered medications on file prior to encounter.    Social History   Socioeconomic History  . Marital status: Legally Separated    Spouse name: Not on file  . Number of children: 1  . Years of education: Not on file  . Highest education level: Not on file  Occupational History  . Occupation: Disability    Comment: Office Work  Scientific laboratory technician  . Financial resource strain: Not on file  . Food insecurity:    Worry: Not on file    Inability: Not on file  . Transportation needs:    Medical: Not on file    Non-medical: Not on file  Tobacco Use  . Smoking status: Former Smoker    Packs/day: 1.50    Years: 14.00    Pack years: 21.00    Types: Cigarettes    Last attempt to quit: 08/09/2003    Years since quitting: 14.2  . Smokeless tobacco: Never Used  Substance and Sexual Activity  . Alcohol use: Yes    Alcohol/week: 0.0 oz    Comment: 02/20/2016 "drank a little in my teens"  . Drug use: No  . Sexual activity: Never  Lifestyle  . Physical activity:    Days per week: Not on file    Minutes per session: Not on  file  . Stress: Not on file  Relationships  . Social connections:  Talks on phone: Not on file    Gets together: Not on file    Attends religious service: Not on file    Active member of club or organization: Not on file    Attends meetings of clubs or organizations: Not on file    Relationship status: Not on file  Other Topics Concern  . Not on file  Social History Narrative  . Not on file   Family History  Problem Relation Age of Onset  . Heart attack Mother   . Prostate cancer Father   . Diabetes Brother   . Kidney failure Brother   . Breast cancer Maternal Aunt   . Colon cancer Neg Hx   . Stomach cancer Neg Hx     Vitals:   11/18/17 0852  BP: (!) 96/54  Pulse: (!) 56  Weight: 183 lb 3.2 oz (83.1 kg)   Wt Readings from Last 3 Encounters:  11/18/17 183 lb 3.2 oz (83.1 kg)  11/12/17 189 lb 8 oz (86 kg)  07/26/17 187 lb 6.4 oz (85 kg)    Physical Exam General: Well appearing. No resp difficulty. HEENT: Normal Neck: Supple. JVP 7-8. Carotids 2+ bilat; no bruits. No thyromegaly or nodule noted. Cor: PMI nondisplaced. RRR, 3/6 HSM apex/LLSB. Trace to 1+ edema with compression hose.  Lungs: CTAB, normal effort. Abdomen: Soft, non-tender, non-distended, no HSM. No bruits or masses. +BS  Extremities: No cyanosis, clubbing, or rash.  Neuro: Alert & orientedx3, cranial nerves grossly intact. moves all 4 extremities w/o difficulty. Affect pleasant   Assessment/Plan: 1. Chronic diastolic CHF with pulmonary hypertension/RV failure:  - Echo 11/13/17 with LVEF 65-70%, Grade 3 DD, ?severe MR, Mod/Severe TR.  - NYHA III symptoms.  - Volume status improved. Weight is down 6 lbs from last visit - Continue torsemide 80 mg BID. BMET today. - Can take metolazone as needed.  - Continue compression hose.  2. Chronic respiratory failure:  - She is on 3L at rest and 4-5 L with exertion - Component of COPD and OHS.  3. OSA - Encouraged nightly CPAP.  4. CKD Stage III-IV -  Baseline stable Around 2.0-2.2.  5. Pulmonary hypertension: This is primarily Vergas group 2 (pulmonary venous hypertension) based on RHC in 3/17.   - Not a good candidate for pulmonary vasodilators.  6. Mitral regurgitation:  Last echo in 07/2016 mild MR.  - ? Severe MR on Echo 11/13/17. Will need TEE to further quantify.  7. Anemia: Suspect anemia of chronic disease/renal disease but also with component of GI blood loss.  She has had GI bleeding with FOBT+, but unable to visualize bleeding source on EGD, colonoscopy, enteroscopy, or capsule endoscopy (most recently in 7/17).  Seeing hematology (Dr. Irene Limbo), getting Aranesp and Fe infusions with Fe deficiency and possible MDS.  - CBC stable 11/12/17.  8. Atrial fibrillation: Persistent.  Rate is controlled.  CHADSVASC = 3.   - Not good candidate for Watchman due to severe recurrent GI bleeding. AC stopped.  - No stroke like symptoms.  9. Chronic thrombocytopenia:  - Suspect related to myelodysplastic syndrome (MDS).   BMET today. Will need repeat TEE. Keep 3 week appt with Dr. Aundra Dubin. No room or need to up-titrate meds at this time.   Shirley Friar, PA-C  11/18/2017  Greater than 50% of the 25 minute visit was spent in counseling/coordination of care regarding disease state education, salt/fluid restriction, sliding scale diuretics, and medication compliance.

## 2017-11-18 ENCOUNTER — Other Ambulatory Visit (HOSPITAL_COMMUNITY): Payer: Self-pay

## 2017-11-18 ENCOUNTER — Encounter (HOSPITAL_COMMUNITY): Payer: Self-pay

## 2017-11-18 ENCOUNTER — Ambulatory Visit (HOSPITAL_COMMUNITY)
Admission: RE | Admit: 2017-11-18 | Discharge: 2017-11-18 | Disposition: A | Discharge status: Home / Self Care | Payer: Medicare Other | Source: Ambulatory Visit | Attending: Cardiology | Admitting: Cardiology

## 2017-11-18 VITALS — BP 96/54 | HR 56 | Wt 183.2 lb

## 2017-11-18 DIAGNOSIS — I480 Paroxysmal atrial fibrillation: Secondary | ICD-10-CM

## 2017-11-18 DIAGNOSIS — M069 Rheumatoid arthritis, unspecified: Secondary | ICD-10-CM | POA: Diagnosis not present

## 2017-11-18 DIAGNOSIS — D649 Anemia, unspecified: Secondary | ICD-10-CM | POA: Insufficient documentation

## 2017-11-18 DIAGNOSIS — I340 Nonrheumatic mitral (valve) insufficiency: Secondary | ICD-10-CM | POA: Diagnosis not present

## 2017-11-18 DIAGNOSIS — Z79899 Other long term (current) drug therapy: Secondary | ICD-10-CM | POA: Insufficient documentation

## 2017-11-18 DIAGNOSIS — J9611 Chronic respiratory failure with hypoxia: Secondary | ICD-10-CM | POA: Diagnosis not present

## 2017-11-18 DIAGNOSIS — I481 Persistent atrial fibrillation: Secondary | ICD-10-CM | POA: Diagnosis not present

## 2017-11-18 DIAGNOSIS — Z87891 Personal history of nicotine dependence: Secondary | ICD-10-CM | POA: Diagnosis not present

## 2017-11-18 DIAGNOSIS — G4733 Obstructive sleep apnea (adult) (pediatric): Secondary | ICD-10-CM | POA: Insufficient documentation

## 2017-11-18 DIAGNOSIS — J449 Chronic obstructive pulmonary disease, unspecified: Secondary | ICD-10-CM | POA: Insufficient documentation

## 2017-11-18 DIAGNOSIS — N183 Chronic kidney disease, stage 3 unspecified: Secondary | ICD-10-CM

## 2017-11-18 DIAGNOSIS — Z8249 Family history of ischemic heart disease and other diseases of the circulatory system: Secondary | ICD-10-CM | POA: Diagnosis not present

## 2017-11-18 DIAGNOSIS — D696 Thrombocytopenia, unspecified: Secondary | ICD-10-CM | POA: Insufficient documentation

## 2017-11-18 DIAGNOSIS — Z803 Family history of malignant neoplasm of breast: Secondary | ICD-10-CM | POA: Diagnosis not present

## 2017-11-18 DIAGNOSIS — I482 Chronic atrial fibrillation: Secondary | ICD-10-CM | POA: Insufficient documentation

## 2017-11-18 DIAGNOSIS — I130 Hypertensive heart and chronic kidney disease with heart failure and stage 1 through stage 4 chronic kidney disease, or unspecified chronic kidney disease: Secondary | ICD-10-CM | POA: Insufficient documentation

## 2017-11-18 DIAGNOSIS — Z841 Family history of disorders of kidney and ureter: Secondary | ICD-10-CM | POA: Insufficient documentation

## 2017-11-18 DIAGNOSIS — I272 Pulmonary hypertension, unspecified: Secondary | ICD-10-CM | POA: Diagnosis not present

## 2017-11-18 DIAGNOSIS — J9610 Chronic respiratory failure, unspecified whether with hypoxia or hypercapnia: Secondary | ICD-10-CM | POA: Insufficient documentation

## 2017-11-18 DIAGNOSIS — I10 Essential (primary) hypertension: Secondary | ICD-10-CM

## 2017-11-18 DIAGNOSIS — Z833 Family history of diabetes mellitus: Secondary | ICD-10-CM | POA: Diagnosis not present

## 2017-11-18 DIAGNOSIS — Z9981 Dependence on supplemental oxygen: Secondary | ICD-10-CM | POA: Insufficient documentation

## 2017-11-18 DIAGNOSIS — I5032 Chronic diastolic (congestive) heart failure: Secondary | ICD-10-CM | POA: Diagnosis not present

## 2017-11-18 DIAGNOSIS — N184 Chronic kidney disease, stage 4 (severe): Secondary | ICD-10-CM | POA: Diagnosis not present

## 2017-11-18 DIAGNOSIS — Z9989 Dependence on other enabling machines and devices: Secondary | ICD-10-CM

## 2017-11-18 LAB — BASIC METABOLIC PANEL
ANION GAP: 10 (ref 5–15)
BUN: 39 mg/dL — AB (ref 6–20)
CO2: 32 mmol/L (ref 22–32)
Calcium: 9.5 mg/dL (ref 8.9–10.3)
Chloride: 101 mmol/L (ref 101–111)
Creatinine, Ser: 2.74 mg/dL — ABNORMAL HIGH (ref 0.44–1.00)
GFR calc Af Amer: 19 mL/min — ABNORMAL LOW (ref >60)
GFR calc non Af Amer: 17 mL/min — ABNORMAL LOW (ref >60)
GLUCOSE: 112 mg/dL — AB (ref 65–99)
POTASSIUM: 3.6 mmol/L (ref 3.5–5.1)
Sodium: 143 mmol/L (ref 135–145)

## 2017-11-18 NOTE — Patient Instructions (Signed)
You have been scheduled for a transesophageal echocardiogram. See instruction sheet for additional details.  Routine lab work today. Will notify you of abnormal results, otherwise no news is good news!  Follow up as scheduled.  Take all medication as prescribed the day of your appointment. Bring all medications with you to your appointment.  Do the following things EVERYDAY: 1) Weigh yourself in the morning before breakfast. Write it down and keep it in a log. 2) Take your medicines as prescribed 3) Eat low salt foods-Limit salt (sodium) to 2000 mg per day.  4) Stay as active as you can everyday 5) Limit all fluids for the day to less than 2 liters

## 2017-11-19 ENCOUNTER — Telehealth (HOSPITAL_COMMUNITY): Payer: Self-pay | Admitting: Cardiology

## 2017-11-19 DIAGNOSIS — I5032 Chronic diastolic (congestive) heart failure: Secondary | ICD-10-CM

## 2017-11-19 MED ORDER — TORSEMIDE 20 MG PO TABS: ORAL_TABLET | ORAL | 3 refills | Status: DC

## 2017-11-19 NOTE — Telephone Encounter (Signed)
-----   Message from Shirley Friar, PA-C sent at 11/18/2017 11:24 AM EDT ----- Not far from baseline.  Cut torsemide back to 80/60 and recheck 7-10 days.     Legrand Como [address]" [place], PA-C 11/18/2017 11:24 AM

## 2017-11-19 NOTE — Telephone Encounter (Signed)
Patient aware/ repeat labs 4/24

## 2017-11-20 ENCOUNTER — Other Ambulatory Visit (HOSPITAL_COMMUNITY): Payer: Self-pay

## 2017-11-20 ENCOUNTER — Other Ambulatory Visit: Payer: Self-pay

## 2017-11-20 ENCOUNTER — Ambulatory Visit (HOSPITAL_COMMUNITY)
Admission: RE | Admit: 2017-11-20 | Discharge: 2017-11-20 | Disposition: A | Discharge status: Home / Self Care | Payer: Medicare Other | Source: Ambulatory Visit | Attending: Cardiology | Admitting: Cardiology

## 2017-11-20 ENCOUNTER — Encounter (HOSPITAL_COMMUNITY): Payer: Self-pay

## 2017-11-20 ENCOUNTER — Ambulatory Visit (HOSPITAL_BASED_OUTPATIENT_CLINIC_OR_DEPARTMENT_OTHER): Payer: Medicare Other

## 2017-11-20 ENCOUNTER — Encounter (HOSPITAL_COMMUNITY)
Admission: RE | Disposition: A | Discharge status: Home / Self Care | Payer: Self-pay | Source: Ambulatory Visit | Attending: Cardiology

## 2017-11-20 DIAGNOSIS — I340 Nonrheumatic mitral (valve) insufficiency: Secondary | ICD-10-CM | POA: Diagnosis not present

## 2017-11-20 DIAGNOSIS — J449 Chronic obstructive pulmonary disease, unspecified: Secondary | ICD-10-CM | POA: Insufficient documentation

## 2017-11-20 DIAGNOSIS — I5032 Chronic diastolic (congestive) heart failure: Secondary | ICD-10-CM | POA: Diagnosis not present

## 2017-11-20 DIAGNOSIS — M069 Rheumatoid arthritis, unspecified: Secondary | ICD-10-CM | POA: Diagnosis not present

## 2017-11-20 DIAGNOSIS — J9610 Chronic respiratory failure, unspecified whether with hypoxia or hypercapnia: Secondary | ICD-10-CM | POA: Insufficient documentation

## 2017-11-20 DIAGNOSIS — G4733 Obstructive sleep apnea (adult) (pediatric): Secondary | ICD-10-CM | POA: Insufficient documentation

## 2017-11-20 DIAGNOSIS — Z87891 Personal history of nicotine dependence: Secondary | ICD-10-CM | POA: Insufficient documentation

## 2017-11-20 DIAGNOSIS — I2722 Pulmonary hypertension due to left heart disease: Secondary | ICD-10-CM | POA: Diagnosis not present

## 2017-11-20 DIAGNOSIS — D649 Anemia, unspecified: Secondary | ICD-10-CM | POA: Insufficient documentation

## 2017-11-20 DIAGNOSIS — Z6831 Body mass index (BMI) 31.0-31.9, adult: Secondary | ICD-10-CM | POA: Diagnosis not present

## 2017-11-20 DIAGNOSIS — N184 Chronic kidney disease, stage 4 (severe): Secondary | ICD-10-CM | POA: Insufficient documentation

## 2017-11-20 DIAGNOSIS — E669 Obesity, unspecified: Secondary | ICD-10-CM | POA: Diagnosis not present

## 2017-11-20 DIAGNOSIS — I481 Persistent atrial fibrillation: Secondary | ICD-10-CM | POA: Insufficient documentation

## 2017-11-20 DIAGNOSIS — I130 Hypertensive heart and chronic kidney disease with heart failure and stage 1 through stage 4 chronic kidney disease, or unspecified chronic kidney disease: Secondary | ICD-10-CM | POA: Diagnosis present

## 2017-11-20 DIAGNOSIS — Z9981 Dependence on supplemental oxygen: Secondary | ICD-10-CM | POA: Diagnosis not present

## 2017-11-20 DIAGNOSIS — D696 Thrombocytopenia, unspecified: Secondary | ICD-10-CM | POA: Diagnosis not present

## 2017-11-20 DIAGNOSIS — Z8249 Family history of ischemic heart disease and other diseases of the circulatory system: Secondary | ICD-10-CM | POA: Diagnosis not present

## 2017-11-20 HISTORY — PX: TEE WITHOUT CARDIOVERSION: SHX5443

## 2017-11-20 SURGERY — ECHOCARDIOGRAM, TRANSESOPHAGEAL
Anesthesia: Moderate Sedation

## 2017-11-20 MED ORDER — FENTANYL CITRATE (PF) 100 MCG/2ML IJ SOLN
INTRAMUSCULAR | Status: AC
  Filled 2017-11-20: qty 2

## 2017-11-20 MED ORDER — MIDAZOLAM HCL 5 MG/5ML IJ SOLN
INTRAMUSCULAR | Status: DC | PRN
  Administered 2017-11-20 (×3): 0.5 mg via INTRAVENOUS
  Administered 2017-11-20: 2 mg via INTRAVENOUS

## 2017-11-20 MED ORDER — FENTANYL CITRATE (PF) 100 MCG/2ML IJ SOLN
INTRAMUSCULAR | Status: DC | PRN
  Administered 2017-11-20: 25 ug via INTRAVENOUS

## 2017-11-20 MED ORDER — MIDAZOLAM HCL 5 MG/ML IJ SOLN
INTRAMUSCULAR | Status: AC
  Filled 2017-11-20: qty 2

## 2017-11-20 MED ORDER — BUTAMBEN-TETRACAINE-BENZOCAINE 2-2-14 % EX AERO
INHALATION_SPRAY | CUTANEOUS | Status: DC | PRN
  Administered 2017-11-20: 2 via TOPICAL

## 2017-11-20 MED ORDER — SODIUM CHLORIDE 0.9 % IV SOLN
INTRAVENOUS | Status: DC
  Administered 2017-11-20: 09:00:00 via INTRAVENOUS

## 2017-11-20 NOTE — CV Procedure (Signed)
Procedure: TEE  Indication: Mitral regurgitation.   Sedation: Versed 3.5 mg, Fentanyl 25 mcg  Findings: Please see echo section for full report.  Mildly dilated left ventricle with normal systolic function, EF 60-45%.  No regional wall motion abnormalities.  Normal wall thickness.  Mildly dilated right ventricle with mildly decreased systolic function.  Moderate-severe TR with peak RV-RA gradient 43 mmHg.  Moderately dilated left atrium, no LA appendage thrombus.  Moderately dilated right atrium.  No ASD/PFO by color doppler.  Trileaflet aortic valve with trivial regurgitation, no stenosis.  There was severe, eccentric, posteriorly-directed mitral regurgitation.  There appeared to be a partial flail segment of the anterior leaflet in the region of A2 scallop.  There was systolic flow reversal in the pulmonary vein doppler signal.  Unable to measure PISA given eccentricity of the jet.  S/p ascending aorta replacement.  There was residual dissection noted in the descending thoracic aorta.   Impression:  Severe mitral regurgitation, partial flail anterior leaflet segment (region of A2).   I will have her evaluated for mitral valve clip.   [name] 11/20/2017 10:06 AM

## 2017-11-20 NOTE — Progress Notes (Signed)
Call placed to Dr. Aundra Dubin, advised unable to print AVS for patient discharge. Per Dr. Aundra Dubin he is unable to view this at this time. Advised medication not reconciled metolazone. Dr. Aundra Dubin states unable to address this at this time. Advised patient will be discharged without printed AVS. Call ended by Dr. Aundra Dubin at that time.

## 2017-11-20 NOTE — Discharge Instructions (Signed)

## 2017-11-20 NOTE — Interval H&P Note (Signed)
History and Physical Interval Note:  11/20/2017 9:25 AM  April Mueller  has presented today for surgery, with the diagnosis of MITRAL REGURGITATION  The various methods of treatment have been discussed with the patient and family. After consideration of risks, benefits and other options for treatment, the patient has consented to  Procedure(s): TRANSESOPHAGEAL ECHOCARDIOGRAM (TEE) (N/A) as a surgical intervention .  The patient's history has been reviewed, patient examined, no change in status, stable for surgery.  I have reviewed the patient's chart and labs.  Questions were answered to the patient's satisfaction.     Thursa Emme [employer]

## 2017-11-27 ENCOUNTER — Other Ambulatory Visit (HOSPITAL_COMMUNITY): Payer: Medicare Other

## 2017-11-29 ENCOUNTER — Other Ambulatory Visit (HOSPITAL_COMMUNITY): Payer: Medicare Other

## 2017-11-29 ENCOUNTER — Ambulatory Visit (INDEPENDENT_AMBULATORY_CARE_PROVIDER_SITE_OTHER): Payer: Medicare Other | Admitting: Cardiovascular Disease

## 2017-11-29 ENCOUNTER — Encounter: Payer: Self-pay | Admitting: Cardiovascular Disease

## 2017-11-29 VITALS — BP 120/88 | HR 61 | Ht 64.0 in | Wt 180.0 lb

## 2017-11-29 DIAGNOSIS — I340 Nonrheumatic mitral (valve) insufficiency: Secondary | ICD-10-CM | POA: Diagnosis not present

## 2017-11-29 LAB — BASIC METABOLIC PANEL
BUN / CREAT RATIO: 17 (ref 12–28)
BUN: 45 mg/dL — AB (ref 8–27)
CALCIUM: 10.2 mg/dL (ref 8.7–10.3)
CHLORIDE: 103 mmol/L (ref 96–106)
CO2: 27 mmol/L (ref 20–29)
Creatinine, Ser: 2.6 mg/dL — ABNORMAL HIGH (ref 0.57–1.00)
GFR calc Af Amer: 21 mL/min/{1.73_m2} — ABNORMAL LOW (ref >59)
GFR calc non Af Amer: 18 mL/min/{1.73_m2} — ABNORMAL LOW (ref >59)
GLUCOSE: 97 mg/dL (ref 65–99)
Potassium: 4.2 mmol/L (ref 3.5–5.2)
Sodium: 144 mmol/L (ref 134–144)

## 2017-11-29 NOTE — Progress Notes (Signed)
Cardiology Office Note Date:  11/29/2017   ID:  April Mueller, DOB Aug 20, 1945, MRN [id number]  PCP:  Lawerance Cruel, MD  Cardiologist:  Dr Aundra Dubin  Chief Complaint  Patient presents with  . Shortness of Breath     History of Present Illness: April Mueller is a 72 y.o. female who presents for evaluation of severe mitral regurgitation, referred by Dr Aundra Dubin.   The patient has a complex history which includes O2 dependent chronic lung disease with history of smoking, severe COPD, rheumatoid arthritis, and obstructive sleep apnea.  She has chronic diastolic heart failure and long-standing mitral and tricuspid regurgitation.  She was evaluated approximately 3 years ago at [facility] for consideration of MitraClip but was turned to help because at that time her mitral regurgitation was felt to be only moderate and she was noted to have severe tricuspid regurgitation as well as very severe COPD.  The patient's last PFTs on file or in 2016 when her FEV1 was 0.74 L.  The patient was last seen in the heart failure clinic November 18, 2017 for close follow-up after adjustment of her diuretic regimen.  An echocardiogram was performed and demonstrated severe mitral regurgitation.  A TEE was performed and demonstrated primary dysfunction of the mitral valve with a flail A2 segment and associated severe eccentric mitral regurgitation.  Other pertinent medical problems have included chronic anemia with a component of chronic kidney disease but also GI blood loss.  She has undergone extensive testing to include EGD, colonoscopy, and capsule endoscopy.  She is followed by hematology.  Patient has persistent atrial fibrillation and she has been a poor candidate for chronic oral anticoagulation with hx of GI bleeding on apixaban.  She was not felt to be an appropriate candidate for [facility] because of severe comorbid medical conditions.   The patient is here with her social worker today. She  remains functionally independent. She's been on home oxygen since approximately 2005 when she was living in [place]. 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 She was on dialysis for about 3 months and then recovered to a point where she was able to discontinue dialysis.   The patient complains of shortness of breath with minimal activity and generalized fatigue. She is short of breath with walking short distances on level ground. She sleeps with CPAP and O2 on 2 pillows without significant orthopnea or PND. She admits to chronic leg swelling. No chest pain or pressure.  Denies any problems with her teeth over the years and sees a dentist on a fairly regular basis - hasn't lost any teeth.   Past Medical History:  Diagnosis Date  . Anemia 03/23/15   transfusion  . Anxiety   . Aortic aneurysm ([place])   . Arthritis    "qwhere" (02/20/2016)  . CHF (congestive heart failure) ([place])   . Chronic lower back pain   . CKD (chronic kidney disease) stage 3, GFR 30-59 ml/min (H[facility])    "on dialysis for 3 months after my aneurysm in 2005" (02/20/2016)  . COPD (chronic obstructive pulmonary disease) ([place])   . Gout   . Heart murmur   . History of blood transfusion "several"  . Hypertension   . Lupus ([place])   . On home oxygen therapy    "3L; 24/7" (02/20/2016)  . OSA on CPAP   . Pulmonary HTN ([place])   . Rheumatoid arthritis(714.0) 11/06/2012  . Tuberculosis  dormant carrier    Past Surgical History:  Procedure Laterality Date  . ABDOMINAL AORTIC ANEURYSM REPAIR  2005  . BREAST EXCISIONAL BIOPSY Right    benign  . BREAST LUMPECTOMY Right   . CARDIAC CATHETERIZATION N/A 10/24/2015   Procedure: Right Heart Cath;  Surgeon: Larey Dresser, MD;  Location: [place] CV LAB;  Service: Cardiovascular;  Laterality: N/A;  . CATARACT EXTRACTION W/ INTRAOCULAR LENS  IMPLANT, BILATERAL Bilateral 2016  . COLONOSCOPY N/A 08/19/2015   Procedure:  COLONOSCOPY;  Surgeon: Jerene Bears, MD;  Location: [facility] ENDOSCOPY;  Service: Endoscopy;  Laterality: N/A;  . ENTEROSCOPY N/A 02/22/2016   Procedure: ENTEROSCOPY;  Surgeon: Milus Banister, MD;  Location: [place];  Service: Endoscopy;  Laterality: N/A;  . ESOPHAGOGASTRODUODENOSCOPY N/A 08/19/2015   Procedure: ESOPHAGOGASTRODUODENOSCOPY (EGD);  Surgeon: Jerene Bears, MD;  Location: [facility] ENDOSCOPY;  Service: Endoscopy;  Laterality: N/A;  patient scheduled, anesthesia aware of 1500 case, per [place].  08/18/15 DP  . GIVENS CAPSULE STUDY N/A 08/21/2015   Procedure: GIVENS CAPSULE STUDY;  Surgeon: Gatha Mayer, MD;  Location: [place];  Service: Endoscopy;  Laterality: N/A;  . HEMORRHOID SURGERY    . RIGHT HEART CATHETERIZATION N/A 08/18/2014   Procedure: RIGHT HEART CATH;  Surgeon: Jolaine Artist, MD;  Location: [facility] CATH LAB;  Service: Cardiovascular;  Laterality: N/A;  . TEE WITHOUT CARDIOVERSION N/A 01/05/2015   Procedure: TRANSESOPHAGEAL ECHOCARDIOGRAM (TEE);  Surgeon: Larey Dresser, MD;  Location: [place];  Service: Cardiovascular;  Laterality: N/A;  . TEE WITHOUT CARDIOVERSION N/A 11/20/2017   Procedure: TRANSESOPHAGEAL ECHOCARDIOGRAM (TEE);  Surgeon: Larey Dresser, MD;  Location: [facility] ENDOSCOPY;  Service: Cardiovascular;  Laterality: N/A;  . WISDOM TOOTH EXTRACTION      Current Outpatient Medications  Medication Sig Dispense Refill  . acetaminophen (TYLENOL) 500 MG tablet Take 1,000 mg by mouth every 6 (six) hours as needed for moderate pain or headache.    . allopurinol (ZYLOPRIM) 300 MG tablet Take 300 mg by mouth daily.    [name] ALPRAZolam (XANAX) 0.25 MG tablet Take 1 tablet (0.25 mg total) by mouth every 12 (twelve) hours as needed for anxiety or sleep. (Patient taking differently: Take 0.25 mg by mouth at bedtime. ) 60 tablet 5  . atorvastatin (LIPITOR) 10 MG tablet Take 1 tablet (10 mg total) by mouth daily. 90 tablet 2  . Biotin 5000 MCG CAPS Take 5,000 mcg by mouth daily.       . hydroxychloroquine (PLAQUENIL) 200 MG tablet Take 200 mg by mouth 2 (two) times daily.    . metoprolol succinate (TOPROL-XL) 25 MG 24 hr tablet Take 1 tablet (25 mg total) by mouth 2 (two) times daily. 180 tablet 3  . Omega-3 Fatty Acids (FISH OIL) 1200 MG CAPS Take 1,200 mg by mouth daily.    . OXYGEN Inhale 3 L into the lungs continuous.     . Polyethylene Glycol 400 (BLINK TEARS OP) Place 1-2 drops into both eyes at bedtime.    . potassium chloride (K-DUR,KLOR-CON) 10 MEQ tablet Take 2 tablets (20 mEq total) by mouth 2 (two) times daily. (Patient taking differently: Take 40 mEq by mouth 2 (two) times daily. ) 120 tablet 3  . torsemide (DEMADEX) 20 MG tablet Take 4 tablets (80 mg total) by mouth every morning AND 3 tablets (60 mg total) every evening. 210 tablet 3   No current facility-administered medications for this visit.     Allergies:   Patient has no known  allergies.   Social History:  The patient  reports that she quit smoking about 14 years ago. Her smoking use included cigarettes. She has a 21.00 pack-year smoking history. She has never used smokeless tobacco. She reports that she drinks alcohol. She reports that she does not use drugs.   Family History:  The patient's family history includes Breast cancer in her maternal aunt; Diabetes in her brother; Heart attack in her mother; Kidney failure in her brother; Prostate cancer in her father.   ROS:  Please see the history of present illness.  Otherwise, review of systems is positive for easy bruising, balance problems.  All other systems are reviewed and negative.   PHYSICAL EXAM: VS:  BP 120/88 (BP Location: Left Arm, Patient Position: Sitting)   Pulse 61   Ht 5\' 4"  (1.626 m)   Wt 180 lb (81.6 kg)   SpO2 95%   BMI 30.90 kg/m  , BMI Body mass index is 30.9 kg/m. GEN: Pleasant woman, in no acute distress, on O2 HEENT: normal  Neck: JVP elevated with HJR, no masses. BL carotid bruits Cardiac: irregular with 3/6  holosystolic murmur at the LLSB and 3/6 late systolic murmur at the apex                Respiratory:  clear to auscultation bilaterally, normal work of breathing GI: soft, nontender, nondistended, + BS MS: no deformity or atrophy  Ext: 2+ BL pretibial edema Skin: warm and dry, no rash Neuro:  Strength and sensation are intact Psych: euthymic mood, full affect  EKG:  EKG is not ordered today.  Recent Labs: 06/11/2017: ALT 21 11/12/2017: Hemoglobin 10.2; Platelets 74 11/18/2017: BUN 39; Creatinine, Ser 2.74; Potassium 3.6; Sodium 143   Lipid Panel     Component Value Date/Time   CHOL 105 11/12/2017 1012   CHOL 114 04/02/2016 0909   TRIG 45 11/12/2017 1012   HDL 53 11/12/2017 1012   HDL 61 04/02/2016 0909   CHOLHDL 2.0 11/12/2017 1012   VLDL 9 11/12/2017 1012   LDLCALC 43 11/12/2017 1012   LDLCALC 43 04/02/2016 0909      Wt Readings from Last 3 Encounters:  11/29/17 180 lb (81.6 kg)  11/20/17 183 lb (83 kg)  11/18/17 183 lb 3.2 oz (83.1 kg)     Cardiac Studies Reviewed: Right Heart Catheterization 10-24-2015: Procedures   Right Heart Cath  Conclusion   1. Mildly elevated left heart filling pressure.  2. Mild pulmonary hypertension, primarily pulmonary venous hypertension with PVR 1.9 WU. 3. Elevated right heart filling pressure.   Mrs Calame appears to have primarily RV failure.  Her PCWP is fairly optimized, I am going to continue her current diuretic regimen.  She is not a candidate for selective pulmonary vasodilators.   Complications   Complications documented in old activity   Procedure: Right Heart Cath   Indication: Pulmonary hypertension, CHF.   Procedural Details: The right brachial area was prepped, draped, and anesthetized with 1% lidocaine. There was a pre-existing peripheral IV in the right brachial area. This was replaced with a 46F venous sheath. A Swan-Ganz catheter was used for the right heart catheterization. Standard protocol was followed for  recording of right heart pressures and sampling of oxygen saturations. Fick cardiac output was calculated. There were no immediate procedural complications. The patient was transferred to the post catheterization recovery area for further monitoring.  Estimated blood loss <50 mL.      Right Heart   Right Heart Pressures RHC  Procedural Findings: Hemodynamics (mmHg) RA mean 13 RV 49/12 PA 44/22, mean 29 PCWP mean 17  Oxygen saturations: PA 66% AO 96%  Cardiac Output (Fick) 6.18  Cardiac Index (Fick) 3.04  PVR 1.9 WU   Echo 11-13-2017: Study Conclusions  - Left ventricle: The cavity size was normal. Wall thickness was   normal. Systolic function was vigorous. The estimated ejection   fraction was in the range of 65% to 70%. Wall motion was normal;   there were no regional wall motion abnormalities. Doppler   parameters are consistent with restrictive left ventricular   relaxation (grade 3 diastolic dysfunction). The E/e&' ratio is   >20, suggesting markedly elevated LV filling pressure. - Aortic valve: There was trivial regurgitation. - Aorta: Aortic root dimension: 39 mm (ED). - Aortic root: The aortic root is dilated. - Mitral valve: Late prolapse of the AMVL. Moderate to severe   eccentric MR - tracking posterolaterally. - Left atrium: Severely dilated. - Right ventricle: The cavity size was mildly dilated. Mildly   reduced systolic function. - Right atrium: Severely dilated. - Atrial septum: Type 1R atrial septal aneurysm - bows right to   left, suggestive of high LA pressure. - Tricuspid valve: There was moderate to severe regurgitation. - Pulmonary arteries: PA peak pressure: 59 mm Hg (S). - Inferior vena cava: The vessel was dilated. The respirophasic   diameter changes were blunted (< 50%), consistent with elevated   central venous pressure.  Impressions:  - Compared to a prior study in 2017, the LVEF is higher at 65-70%,   there is restrictive diastology  with elevated LV filling   pressure. The degree of MR is likely severe and eccentric,   tracking along the posterolateral wall with anterior leaflet   prolapse - the LA is severely dilated as is the RA. There is   moderate to severe TR. RVSP is 59 mmHg with a dilated IVC.   Recommend TEE to further evaluate MR.  TEE 11-20-2017: Study Conclusions  - Left ventricle: The cavity size was mildly dilated. Wall   thickness was normal. Systolic function was normal. The estimated   ejection fraction was in the range of 55% to 60%. Wall motion was   normal; there were no regional wall motion abnormalities. - Aortic valve: There was no stenosis. There was trivial   regurgitation. - Aorta: S/p ascending aorta replacement. There was residual   dissection noted in the descending thoracic aorta. - Mitral valve: There was severe, eccentric, posteriorly-directed   mitral regurgitation. There appeared to be a partial flail   segment of the anterior leaflet in the region of A2 scallop.   There was systolic flow reversal in the pulmonary vein doppler   signal. Unable to measure PISA given eccentricity of the jet. - Left atrium: The atrium was moderately dilated. No evidence of   thrombus in the atrial cavity or appendage. - Right ventricle: The cavity size was mildly dilated. Systolic   function was mildly reduced. - Right atrium: The atrium was moderately dilated. - Atrial septum: No ASD/PFO by color doppler. - Tricuspid valve: Moderate-severe TR with peak RV-RA gradient 43   mmHg.  Impressions:  - Severe mitral regurgitation, partial flail anterior leaflet   segment (region of A2).  STS Risk Calculator: Risk of Mortality: 8.434% Renal Failure: 37.858% Permanent Stroke: 2.657% Prolonged Ventilation: 38.617% DSW Infection: 0.191% Reoperation: 5.613% Morbidity or Mortality: 56.476% Short Length of Stay: 7.254% Long Length of Stay: 35.976%  ASSESSMENT AND PLAN: This is  a 72 year old woman with  chronic diastolic heart failure, New York Heart Association functional class III symptoms, and severe stage D symptomatic mitral regurgitation.  The patient has multiple severe comorbid medical conditions including O2 dependent chronic lung disease, stage III-IV chronic kidney disease, anemia which is likely factorial from chronic kidney disease and chronic GI blood loss, blood pressure myelodysplastic syndrome, and history of type a aortic dissection requiring emergency surgical repair.  In addition, patient has significant tricuspid regurgitation and  has chronic right heart failure.  We reviewed the natural history of severe mitral regurgitation today at length.  The patient's recent echo and TEE studies are reviewed and she appears to have severe eccentric mitral regurgitation based on primary abnormality of the mitral valve with a flail A2 segment of the antrior mitral leaflet. We reviewed potential treatment options including conventional surgery with mitral and tricuspid valve repair combined with maze surgery, percutaneous treatment with edge-to-edge mitral leaflet approximation (Mitra Clip), and ongoing palliative medical therapy. She is interested in pursuing further treatment options. We discussed evaluation necessary for MitraClip and discussed the procedure at length today. Other specific issues related to her case include high risk of contrast administration with advanced kidney disease and history of short term hemodialysis, as well as inability to take anticoagulant Rx. She likely would be able to take ASA 81 mg alone. As next steps in her evaluation, I have recommended the following:   Formal cardiac surgical evaluation with Dr Roxy Manns. She understands that it is unlikely she will be a candidate for surgery because of comorbidities outlined above.  Formal pulmonary follow-up with Dr Lamonte Sakai and updated PFT's to address risk of general anesthesia which would be required for MitraClip  Review of  her case with the Multidisciplinary Heart Valve Team to review anatomic feasibility of mitraclip as well as the complexity of her clinical case.  Will arrange FU here after her case has been reviewed.   Current medicines are reviewed with the patient today.  The patient does not have concerns regarding medicines.  Labs/ tests ordered today include:   Orders Placed This Encounter  Procedures  . Basic metabolic panel  . Ambulatory referral to Cardiothoracic Surgery  . Pulmonary Function Test   Signed, Sherren Mocha, MD  11/29/2017 3:04 PM    [facility] [place], [address], [place]  [address] Phone: [phone]; Fax: [phone]

## 2017-11-29 NOTE — Patient Instructions (Addendum)
Medication Instructions:  Your provider recommends that you continue on your current medications as directed. Please refer to the Current Medication list given to you today.    Labwork: TODAY: BMET  Testing/Procedures: Your physician has recommended that you have a pulmonary function test. Pulmonary Function Tests are a group of tests that measure how well air moves in and out of your lungs.  Follow-Up: Your provider recommends that you schedule a follow-up appointment with Dr. Lamonte Sakai after your PFTs are complete.  You have been referred to Dr. Roxy Manns for evaluation of severe mitral regurgitation.  You have an appointment scheduled with Dr. Burt Knack Thursday, May 30th at 11:00AM.  Any Other Special Instructions Will Be Listed Below (If Applicable).     If you need a refill on your cardiac medications before your next appointment, please call your pharmacy.

## 2017-12-04 ENCOUNTER — Ambulatory Visit (INDEPENDENT_AMBULATORY_CARE_PROVIDER_SITE_OTHER): Payer: Medicare Other | Admitting: Internal Medicine

## 2017-12-04 DIAGNOSIS — I340 Nonrheumatic mitral (valve) insufficiency: Secondary | ICD-10-CM

## 2017-12-04 LAB — PULMONARY FUNCTION TEST
DL/VA % PRED: 59 %
DL/VA: 2.87 ml/min/mmHg/L
DLCO cor % pred: 26 %
DLCO cor: 6.44 ml/min/mmHg
DLCO unc % pred: 23 %
DLCO unc: 5.7 ml/min/mmHg
FEF 25-75 Post: 0.35 L/sec
FEF 25-75 Pre: 0.39 L/sec
FEF2575-%CHANGE-POST: -10 %
FEF2575-%PRED-POST: 20 %
FEF2575-%Pred-Pre: 22 %
FEV1-%CHANGE-POST: -2 %
FEV1-%Pred-Post: 37 %
FEV1-%Pred-Pre: 38 %
FEV1-PRE: 0.7 L
FEV1-Post: 0.68 L
FEV1FVC-%Change-Post: 9 %
FEV1FVC-%PRED-PRE: 88 %
FEV6-%Change-Post: -10 %
FEV6-%Pred-Post: 40 %
FEV6-%Pred-Pre: 45 %
FEV6-PRE: 1.03 L
FEV6-Post: 0.92 L
FEV6FVC-%Pred-Post: 104 %
FEV6FVC-%Pred-Pre: 104 %
FVC-%CHANGE-POST: -10 %
FVC-%PRED-POST: 38 %
FVC-%Pred-Pre: 43 %
FVC-PRE: 1.03 L
FVC-Post: 0.92 L
POST FEV1/FVC RATIO: 74 %
PRE FEV6/FVC RATIO: 100 %
Post FEV6/FVC ratio: 100 %
Pre FEV1/FVC ratio: 68 %
RV % PRED: 65 %
RV: 1.43 L
TLC % pred: 48 %
TLC: 2.43 L

## 2017-12-04 NOTE — Progress Notes (Signed)
PFT done today. 

## 2017-12-17 ENCOUNTER — Encounter: Payer: Self-pay | Admitting: Thoracic Surgery (Cardiothoracic Vascular Surgery)

## 2017-12-17 ENCOUNTER — Encounter: Payer: Self-pay | Admitting: Emergency Medicine

## 2017-12-17 ENCOUNTER — Institutional Professional Consult (permissible substitution): Payer: Medicare Other | Admitting: Thoracic Surgery (Cardiothoracic Vascular Surgery)

## 2017-12-17 ENCOUNTER — Ambulatory Visit: Payer: Medicare Other | Admitting: Emergency Medicine

## 2017-12-17 VITALS — BP 112/64 | HR 60 | Ht 64.0 in | Wt 183.0 lb

## 2017-12-17 VITALS — BP 96/63 | HR 59 | Resp 20 | Ht 64.0 in | Wt 183.0 lb

## 2017-12-17 DIAGNOSIS — I7101 Dissection of thoracic aorta: Secondary | ICD-10-CM | POA: Insufficient documentation

## 2017-12-17 DIAGNOSIS — Z9981 Dependence on supplemental oxygen: Secondary | ICD-10-CM

## 2017-12-17 DIAGNOSIS — I5032 Chronic diastolic (congestive) heart failure: Secondary | ICD-10-CM | POA: Diagnosis not present

## 2017-12-17 DIAGNOSIS — Z9989 Dependence on other enabling machines and devices: Principal | ICD-10-CM

## 2017-12-17 DIAGNOSIS — I71019 Dissection of thoracic aorta, unspecified: Secondary | ICD-10-CM | POA: Insufficient documentation

## 2017-12-17 DIAGNOSIS — J9611 Chronic respiratory failure with hypoxia: Secondary | ICD-10-CM

## 2017-12-17 DIAGNOSIS — I4819 Other persistent atrial fibrillation: Secondary | ICD-10-CM

## 2017-12-17 DIAGNOSIS — G4733 Obstructive sleep apnea (adult) (pediatric): Secondary | ICD-10-CM

## 2017-12-17 DIAGNOSIS — I481 Persistent atrial fibrillation: Secondary | ICD-10-CM | POA: Diagnosis not present

## 2017-12-17 DIAGNOSIS — I340 Nonrheumatic mitral (valve) insufficiency: Secondary | ICD-10-CM

## 2017-12-17 DIAGNOSIS — Z9889 Other specified postprocedural states: Secondary | ICD-10-CM | POA: Insufficient documentation

## 2017-12-17 MED ORDER — TIOTROPIUM BROMIDE-OLODATEROL 2.5-2.5 MCG/ACT IN AERS: 2.0000 | INHALATION_SPRAY | Freq: Every day | RESPIRATORY_TRACT | 0 refills | Status: DC

## 2017-12-17 NOTE — Progress Notes (Signed)
Subjective:    Patient ID: April Mueller, female    DOB: 1946-04-03, 72 y.o.   MRN: [id number]  HPI  ROV 12/17/17 --this follow-up visit for 72 year old woman with a history of rheumatoid arthritis on Plaquenil, remote treated latent TB, hypertension and diastolic CHF, COPD, obstructive sleep apnea and mitral valve disease.  She has secondary pulmonary hypertension in the setting of all the above, is managed with torsemide, being adjusted by Dr Aundra Dubin.  She is been treated remotely with targeted [place] therapy but is not on this currently. She also has a history of iron deficiency anemia. Her most recent Hgb was 10.2 in April. She is under evaluation for progressive mitral regurgitation, ? Whether she would be a candidate for intervention.   She feels that she is having less energy for the last, her breathing is limiting her with almost any activity including walking, ADLs.    She had PFT 12/04/17 that I have reviewed. This shows severe mixed obstruction and restriction, restricted volumes, decreased DLCO. She has tried Anoro a few years ago, was unsure that she benefited.   She is compliant with CPAP, gets some clinical benefit. She sleeps well. She does still have to get up at night to urinate. She naps during the day. Works w [employer]. She is wearing    Review of Systems  Constitutional: Negative for fever and unexpected weight change.  HENT: Negative for congestion, dental problem, ear pain, nosebleeds, postnasal drip, rhinorrhea, sinus pressure, sneezing, sore throat and trouble swallowing.   Eyes: Negative for redness and itching.  Respiratory: Positive for shortness of breath. Negative for cough, chest tightness and wheezing.   Cardiovascular: Positive for leg swelling. Negative for palpitations.  Gastrointestinal: Negative for diarrhea, nausea and vomiting.  Genitourinary: Negative for dysuria.  Musculoskeletal: Negative for joint swelling.  Skin: Negative for rash.  Neurological:  Negative for headaches.  Hematological: Does not bruise/bleed easily.  Psychiatric/Behavioral: Negative for dysphoric mood. The patient is not nervous/anxious.     Hepatic Function Latest Ref Rng & Units 06/11/2017 03/11/2017 01/21/2017  Total Protein 6.4 - 8.3 g/dL 7.7 7.5 7.7  Albumin 3.5 - 5.0 g/dL 3.0(L) 3.1(L) 3.0(L)  AST 5 - 34 U/L 24 22 18   ALT 0 - 55 U/L 21 18 13   Alk Phosphatase 40 - 150 U/L 152(H) 133 138  Total Bilirubin 0.20 - 1.20 mg/dL 0.64 0.66 0.52  Bilirubin, Direct 0.0 - 0.3 mg/dL - - -    08/18/14 --  Findings:  RA = 14 RV = 62/3/15 PA = 63/21 (38) PCW = mean 23 v waves to 40 Fick cardiac output/index = 6.3/3.2 Thermodilution CO/CI = 6.1/3.1 PVR = 2.0 WU FA sat = 91% PA sat = 57%, 62%  Assessment: 1. Mild to moderate pulmonary HTN with normal PVR which suggests pulmonary venous HTN 2. Moderately elevated left-sided pressures in the setting of her not taking lasix this am 3. Normal cardiac output      Objective:   Physical Exam Vitals:   12/17/17 1137  BP: 112/64  Pulse: 60  SpO2: 91%  Weight: 183 lb (83 kg)  Height: 5' 4"  (1.626 m)   Gen: Pleasant, well-nourished, in no distress,  normal affect  ENT: No lesions,  mouth clear,  oropharynx clear, no postnasal drip  Neck: No JVD, no TMG, no carotid bruits  Lungs: No use of accessory muscles, distant, clear without rales or rhonchi  Cardiovascular: RRR, heart sounds normal, no murmur or gallops, no peripheral edema  Musculoskeletal:  No deformities, no cyanosis or clubbing, trace to 1+ ankle edema  Neuro: alert, non focal  Skin: Warm, no lesions or rashes   Study Conclusions 12/17/12 --  - Left ventricle: The cavity size was normal. Wall thickness was normal. Systolic function was normal. The estimated ejection fraction was in the range of 55% to 60%. Wall motion was normal; there were no regional wall motion abnormalities. - Mitral valve: MR is eccentric and directed posteriorl somewhat  hard to judge extent Mildly calcified annulus. Mildly thickened leaflets . Mild to moderate regurgitation. - Left atrium: The atrium was mildly dilated. - Right ventricle: The cavity size was mildly dilated. - Right atrium: The atrium was mildly dilated. - Atrial septum: The septum bowed from left to right, consistent with increased left atrial pressure. - Pulmonary arteries: PA peak pressure: 12m Hg (S).      Assessment & Plan:  No problem-specific Assessment & Plan notes found for this encounter.  RBaltazar Apo MD, PhD 12/17/2017, 11:46 AM [facility] [phone] if no answer [phone]

## 2017-12-17 NOTE — Progress Notes (Signed)
HEART AND [place] SURGERY CONSULTATION REPORT  Referring Provider is Sherren Mocha, MD  Primary Cardiologist is Larey Dresser, MD PCP is Lawerance Cruel, MD  Chief Complaint  Patient presents with  . Mitral Regurgitation    severe...ECHO 4/19/TEE 4/17 CATH in 2017 PFT 12/04/17...saw DR. 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  Patient's cardiac history dates back to 2005 when April Mueller presented with an acute type A aortic dissection.  At the time April Mueller lived in [place] and April Mueller underwent emergency repair with replacement of the ascending thoracic aorta and resuspension of the native aortic valve.  According to the patient April Mueller developed acute respiratory failure for which April Mueller remained ventilator dependent for several weeks and acute renal failure requiring hemodialysis for several months.  April Mueller states that April Mueller never completely recovered and April Mueller has suffered from chronic dyspnea and hypoxemic respiratory failure ever since.  April Mueller was diagnosed with pulmonary hypertension and has been on home oxygen since 2006.  April Mueller was followed by a cardiologist and pulmonologist in [place] until 2013 when April Mueller relocated to  [place].  April Mueller has been followed ever since by Dr. Lamonte Sakai in the pulmonary medicine clinic.  April Mueller was initially evaluated by Dr. Haroldine Laws and has been followed for the last several years by Dr. Aundra Dubin in the advanced heart failure clinic.  April Mueller was noted to have a prominent systolic murmur and diagnosed with mitral regurgitation several years ago.  April Mueller was felt to be a poor candidate for surgery and referred to Dr. Bridgette Habermann in [place] several years ago to consider palliative MitraClip edge to edge mitral valve repair.  However, at that time her mitral regurgitation was felt not to be severe and continued medical therapy was recommended.  April Mueller developed atrial fibrillation and atrial flutter which has become persistent, and April Mueller was eventually taken off of anticoagulation therapy because of recurrent GI bleeding.  Over the past year the patient has developed further worsening in symptoms of shortness of breath and chronic diastolic congestive heart failure with fluid overload despite careful attention to optimal medical therapy and escalating doses of diuretics.  Follow-up echocardiogram performed November 13, 2017 revealed normal left ventricular systolic function with ejection fraction estimated 65 to 70%.  There was trivial aortic insufficiency.  The aortic root measured 39 mm diameter.  There was mitral valve prolapse with moderate to severe mitral regurgitation which appeared worse than previous echocardiograms.  There was severe left atrial enlargement.  The right atrium was severely dilated.  There was moderate to severe tricuspid regurgitation.  Right ventricle was mildly dilated and there was mildly reduced right ventricular systolic function.  The patient subsequently underwent transesophageal echocardiogram November 20, 2017.  Left ventricular systolic function appeared normal.  There was trivial aortic insufficiency.  There was  chronic dissection noted in the descending thoracic aorta.  There was severe mitral  regurgitation.  There appeared to be partially  flail segment involving the middle scallop of the anterior leaflet causing severe mitral regurgitation directed posteriorly around the left atrium.  There was flow reversal in the pulmonary vein.  There was moderate to severe tricuspid regurgitation.  The patient was referred to the multidisciplinary heart valve clinic and has been evaluated previously by Dr. Burt Knack.  Cardiothoracic surgical consultation was requested.  The patient is married but has been separated from her husband for many years.  April Mueller lives alone in [place].  April Mueller has 1 adult son who lives in [place].  April Mueller does not drive an automobile and April Mueller is quite limited physically because of severe chronic hypoxemic respiratory failure.  April Mueller wears 6 L nasal cannula oxygen continuously.  April Mueller is able to cook her own meals and tend April Mueller states that April Mueller can only ambulate very short distances to simple basic needs within her home..  April Mueller has chronic lower extremity edema and orthopnea.  April Mueller denies any history of chest pain or chest tightness either with activity or at rest.  April Mueller denies any history of palpitations, dizzy spells, or syncope.  Past Medical History:  Diagnosis Date  . Anemia 03/23/15   transfusion  . Anxiety   . Aortic aneurysm ([place])   . Arthritis    "qwhere" (02/20/2016)  . CHF (congestive heart failure) ([place])   . Chronic lower back pain   . Chronic thoracic aortic dissection (HCC)    S/P repair ascending thoracic aortic dissection with chronic residual dissection involving transverse aortic arch and descending thoracic aorta  . CKD (chronic kidney disease) stage 3, GFR 30-59 ml/min (HCC)    "on dialysis for 3 months after my aneurysm in 2005" (02/20/2016)  . COPD (chronic obstructive pulmonary disease) ([place])   . Gout   . Heart murmur   . History of blood transfusion "several"  . Hypertension   . Lupus ([place])   . On home oxygen therapy    "3L; 24/7" (02/20/2016)  . OSA on CPAP    . Pulmonary HTN ([place])   . Rheumatoid arthritis(714.0) 11/06/2012  . S/P aortic dissection repair   . Tuberculosis    dormant carrier    Past Surgical History:  Procedure Laterality Date  . BREAST EXCISIONAL BIOPSY Right    benign  . BREAST LUMPECTOMY Right   . CARDIAC CATHETERIZATION N/A 10/24/2015   Procedure: Right Heart Cath;  Surgeon: Larey Dresser, MD;  Location: [facility];  Service: Cardiovascular;  Laterality: N/A;  . CATARACT EXTRACTION W/ INTRAOCULAR LENS  IMPLANT, BILATERAL Bilateral 2016  . COLONOSCOPY N/A 08/19/2015   Procedure: COLONOSCOPY;  Surgeon: Jerene Bears, MD;  Location: [facility] ENDOSCOPY;  Service: Endoscopy;  Laterality: N/A;  . ENTEROSCOPY N/A 02/22/2016   Procedure: ENTEROSCOPY;  Surgeon: Milus Banister, MD;  Location: [place];  Service: Endoscopy;  Laterality: N/A;  . ESOPHAGOGASTRODUODENOSCOPY N/A 08/19/2015   Procedure: ESOPHAGOGASTRODUODENOSCOPY (EGD);  Surgeon: Jerene Bears, MD;  Location: [facility] ENDOSCOPY;  Service: Endoscopy;  Laterality: N/A;  patient scheduled, anesthesia aware of 1500 case, per [place].  08/18/15 DP  . GIVENS CAPSULE STUDY N/A 08/21/2015   Procedure: GIVENS CAPSULE STUDY;  Surgeon: Gatha Mayer, MD;  Location: [facility];  Service: Endoscopy;  Laterality: N/A;  . HEMORRHOID SURGERY    . REPAIR OF ACUTE ASCENDING THORACIC AORTIC DISSECTION  2005  . RIGHT HEART CATHETERIZATION N/A 08/18/2014   Procedure: RIGHT HEART CATH;  Surgeon: Jolaine Artist, MD;  Location: [facility] CATH LAB;  Service: Cardiovascular;  Laterality:  N/A;  . TEE WITHOUT CARDIOVERSION N/A 01/05/2015   Procedure: TRANSESOPHAGEAL ECHOCARDIOGRAM (TEE);  Surgeon: Larey Dresser, MD;  Location: [place];  Service: Cardiovascular;  Laterality: N/A;  . TEE WITHOUT CARDIOVERSION N/A 11/20/2017   Procedure: TRANSESOPHAGEAL ECHOCARDIOGRAM (TEE);  Surgeon: Larey Dresser, MD;  Location: [facility] ENDOSCOPY;  Service: Cardiovascular;  Laterality: N/A;  . WISDOM TOOTH EXTRACTION       Family History  Problem Relation Age of Onset  . Heart attack Mother   . Prostate cancer Father   . Diabetes Brother   . Kidney failure Brother   . Breast cancer Maternal Aunt   . Colon cancer Neg Hx   . Stomach cancer Neg Hx     Social History   Socioeconomic History  . Marital status: Legally Separated    Spouse name: Not on file  . Number of children: 1  . Years of education: Not on file  . Highest education level: Not on file  Occupational History  . Occupation: Disability    Comment: Office Work  Scientific laboratory technician  . Financial resource strain: Not on file  . Food insecurity:    Worry: Not on file    Inability: Not on file  . Transportation needs:    Medical: Not on file    Non-medical: Not on file  Tobacco Use  . Smoking status: Former Smoker    Packs/day: 1.50    Years: 14.00    Pack years: 21.00    Types: Cigarettes    Last attempt to quit: 08/09/2003    Years since quitting: 14.3  . Smokeless tobacco: Never Used  Substance and Sexual Activity  . Alcohol use: Yes    Alcohol/week: 0.0 oz    Comment: 02/20/2016 "drank a little in my teens"  . Drug use: No  . Sexual activity: Never  Lifestyle  . Physical activity:    Days per week: Not on file    Minutes per session: Not on file  . Stress: Not on file  Relationships  . Social connections:    Talks on phone: Not on file    Gets together: Not on file    Attends religious service: Not on file    Active member of club or organization: Not on file    Attends meetings of clubs or organizations: Not on file    Relationship status: Not on file  . Intimate partner violence:    Fear of current or ex partner: Not on file    Emotionally abused: Not on file    Physically abused: Not on file    Forced sexual activity: Not on file  Other Topics Concern  . Not on file  Social History Narrative  . Not on file    Current Outpatient Medications  Medication Sig Dispense Refill  . acetaminophen (TYLENOL) 500 MG  tablet Take 1,000 mg by mouth every 6 (six) hours as needed for moderate pain or headache.    . allopurinol (ZYLOPRIM) 300 MG tablet Take 300 mg by mouth daily.    [name] ALPRAZolam (XANAX) 0.25 MG tablet Take 1 tablet (0.25 mg total) by mouth every 12 (twelve) hours as needed for anxiety or sleep. (Patient taking differently: Take 0.25 mg by mouth at bedtime. ) 60 tablet 5  . atorvastatin (LIPITOR) 10 MG tablet Take 1 tablet (10 mg total) by mouth daily. 90 tablet 2  . Biotin 5000 MCG CAPS Take 5,000 mcg by mouth daily.     . hydroxychloroquine (PLAQUENIL) 200 MG  tablet Take 200 mg by mouth 2 (two) times daily.    . metoprolol succinate (TOPROL-XL) 25 MG 24 hr tablet Take 1 tablet (25 mg total) by mouth 2 (two) times daily. 180 tablet 3  . Omega-3 Fatty Acids (FISH OIL) 1200 MG CAPS Take 1,200 mg by mouth daily.    . OXYGEN Inhale 3 L into the lungs continuous.     . Polyethylene Glycol 400 (BLINK TEARS OP) Place 1-2 drops into both eyes at bedtime.    . potassium chloride (K-DUR,KLOR-CON) 10 MEQ tablet Take 2 tablets (20 mEq total) by mouth 2 (two) times daily. (Patient taking differently: Take 40 mEq by mouth 2 (two) times daily. ) 120 tablet 3  . Tiotropium Bromide-Olodaterol (STIOLTO RESPIMAT) 2.5-2.5 MCG/ACT AERS Inhale 2 puffs into the lungs daily. 2 Inhaler 0  . torsemide (DEMADEX) 20 MG tablet Take 4 tablets (80 mg total) by mouth every morning AND 3 tablets (60 mg total) every evening. 210 tablet 3   No current facility-administered medications for this visit.     No Known Allergies    Review of Systems:   General:  decreased appetite, decreased energy, no weight gain, no weight loss, no fever  Cardiac:  no chest pain with exertion, no chest pain at rest, + SOB with exertion, + resting SOB, no PND, + orthopnea, no palpitations, + arrhythmia, + atrial fibrillation, + LE edema, no dizzy spells, no syncope  Respiratory:  + shortness of breath, + home oxygen, no productive cough, no dry  cough, no bronchitis, no wheezing, no hemoptysis, no asthma, no pain with inspiration or cough, + sleep apnea, + CPAP at night  GI:   no difficulty swallowing, no reflux, no frequent heartburn, no hiatal hernia, no abdominal pain, no constipation, no diarrhea, + hematochezia, no hematemesis, no melena  GU:   no dysuria,  + frequency, no urinary tract infection, no hematuria, no kidney stones, + kidney disease  Vascular:  no pain suggestive of claudication, + pain in feet, no leg cramps, no varicose veins, no DVT, no non-healing foot ulcer  Neuro:   no stroke, no TIA's, no seizures, no headaches, no temporary blindness one eye,  no slurred speech, no peripheral neuropathy, no chronic pain, no instability of gait, no memory/cognitive dysfunction  Musculoskeletal: + mild arthritis, no joint swelling, no myalgias, + difficulty walking, reduced mobility   Skin:   no rash, no itching, no skin infections, no pressure sores or ulcerations  Psych:   no anxiety, no depression, no nervousness, no unusual recent stress  Eyes:   no blurry vision, no floaters, no recent vision changes, + wears glasses or contacts  ENT:   no hearing loss, no loose or painful teeth, no dentures, last saw dentist approximately 1 year ago  Hematologic:  + easy bruising, no abnormal bleeding, no clotting disorder, no frequent epistaxis  Endocrine:  no diabetes, does not check CBG's at home           Physical Exam:   BP 96/63 (BP Location: Right Arm, Patient Position: Sitting, Cuff Size: Normal)   Pulse (!) 59   Resp 20   Ht 5\' 4"  (1.626 m)   Wt 183 lb (83 kg)   SpO2 92% Comment: ON 6L O2  BMI 31.41 kg/m   General:  Moderately obese, chronically ill-appearing  HEENT:  Unremarkable   Neck:   no JVD, no bruits, no adenopathy   Chest:   clear to auscultation, symmetrical breath sounds, no wheezes, no  rhonchi   CV:   Irregular rate and rhythm, grade III/VI holosystolic murmur heard best at apex,  no diastolic  murmur  Abdomen:  soft, non-tender, no masses   Extremities:  warm, well-perfused, pulses not palpable, 1+/2+ bilateral LE edema  Rectal/GU  Deferred  Neuro:   Grossly non-focal and symmetrical throughout  Skin:   Clean and dry, no rashes, no breakdown   Diagnostic Tests:  Transthoracic Echocardiography  Patient:    Jani, Moronta MR #:       [id number] Study Date: 11/13/2017 Gender:     F Age:        65 Height:     162.6 cm Weight:     86 kg BSA:        2 m^2 Pt. Status: Room:   Elenore Paddy, M.D.  REFERRING    [name], M.D.  ATTENDING    Lyman Bishop MD  PERFORMING   [facility], Outpatient  SONOGRAPHER  [facility], RDCS  cc:  ------------------------------------------------------------------- LV EF: 65% -   70%  ------------------------------------------------------------------- Indications:      CHF (I50.32).  ------------------------------------------------------------------- History:   PMH:   Dyspnea and murmur.  Atrial fibrillation. Congestive heart failure.  Chronic obstructive pulmonary disease. Risk factors:  AAA Status Post Repair, Edema, Lupus, Chronic Kidney Disease, Obstructive Sleep Apnea. Family history of coronary artery disease. Former tobacco use. Hypertension.  ------------------------------------------------------------------- Study Conclusions  - Left ventricle: The cavity size was normal. Wall thickness was   normal. Systolic function was vigorous. The estimated ejection   fraction was in the range of 65% to 70%. Wall motion was normal;   there were no regional wall motion abnormalities. Doppler   parameters are consistent with restrictive left ventricular   relaxation (grade 3 diastolic dysfunction). The E/e&' ratio is   >20, suggesting markedly elevated LV filling pressure. - Aortic valve: There was trivial regurgitation. - Aorta: Aortic root dimension: 39 mm (ED). - Aortic root: The aortic root is  dilated. - Mitral valve: Late prolapse of the AMVL. Moderate to severe   eccentric MR - tracking posterolaterally. - Left atrium: Severely dilated. - Right ventricle: The cavity size was mildly dilated. Mildly   reduced systolic function. - Right atrium: Severely dilated. - Atrial septum: Type 1R atrial septal aneurysm - bows right to   left, suggestive of high LA pressure. - Tricuspid valve: There was moderate to severe regurgitation. - Pulmonary arteries: PA peak pressure: 59 mm Hg (S). - Inferior vena cava: The vessel was dilated. The respirophasic   diameter changes were blunted (< 50%), consistent with elevated   central venous pressure.  Impressions:  - Compared to a prior study in 2017, the LVEF is higher at 65-70%,   there is restrictive diastology with elevated LV filling   pressure. The degree of MR is likely severe and eccentric,   tracking along the posterolateral wall with anterior leaflet   prolapse - the LA is severely dilated as is the RA. There is   moderate to severe TR. RVSP is 59 mmHg with a dilated IVC.   Recommend TEE to further evaluate MR.  ------------------------------------------------------------------- Study data:  Comparison was made to the study of 07/13/2016.  Study status:  Routine.  Procedure:  Transthoracic echocardiography. Image quality was adequate.          Transthoracic echocardiography.  M-mode, complete 2D, spectral Doppler, and color Doppler.  Birthdate:  Patient birthdate: 1945/08/18.  Age:  Patient is 73 yr  old.  Sex:  Gender: female.    BMI: 32.5 kg/m^2.  Blood pressure:     123/63  Patient status:  Outpatient.  Study date: Study date: 11/13/2017. Study time: 10:43 AM.  Location:  [facility]  -------------------------------------------------------------------  ------------------------------------------------------------------- Left ventricle:  The cavity size was normal. Wall thickness was normal. Systolic function was  vigorous. The estimated ejection fraction was in the range of 65% to 70%. Wall motion was normal; there were no regional wall motion abnormalities. Doppler parameters are consistent with restrictive left ventricular relaxation (grade 3 diastolic dysfunction). The E/e&' ratio is >20, suggesting markedly elevated LV filling pressure.  ------------------------------------------------------------------- Aortic valve:   Structurally normal valve. Trileaflet. Cusp separation was normal.  Doppler:  Transvalvular velocity was within the normal range. There was no stenosis. There was trivial regurgitation.    VTI ratio of LVOT to aortic valve: 0.51. Peak velocity ratio of LVOT to aortic valve: 0.52. Mean velocity ratio of LVOT to aortic valve: 0.52.    Mean gradient (S): 9 mm Hg. Peak gradient (S): 16 mm Hg.  ------------------------------------------------------------------- Aorta:  Aortic root: The aortic root is dilated. Ascending aorta: The ascending aorta was normal in size.  ------------------------------------------------------------------- Mitral valve:  Late prolapse of the AMVL. Moderate to severe eccentric MR - tracking posterolaterally.  Doppler:     Valve area by pressure half-time: 2.78 cm^2. Indexed valve area by pressure half-time: 1.39 cm^2/m^2.    Mean gradient (D): 4 mm Hg. Peak gradient (D): 14 mm Hg.  ------------------------------------------------------------------- Left atrium:  Severely dilated.  ------------------------------------------------------------------- Atrial septum:  Type 1R atrial septal aneurysm - bows right to left, suggestive of high LA pressure.  ------------------------------------------------------------------- Right ventricle:  The cavity size was mildly dilated. Mildly reduced systolic function.  ------------------------------------------------------------------- Pulmonic valve:    The valve appears to be grossly normal. Doppler:   There was no significant regurgitation.  ------------------------------------------------------------------- Tricuspid valve:   Doppler:  There was moderate to severe regurgitation.  ------------------------------------------------------------------- Pulmonary artery:   The main pulmonary artery was normal-sized.  ------------------------------------------------------------------- Right atrium:  Severely dilated.  ------------------------------------------------------------------- Pericardium:  There was no pericardial effusion.  ------------------------------------------------------------------- Systemic veins: Inferior vena cava: The vessel was dilated. The respirophasic diameter changes were blunted (< 50%), consistent with elevated central venous pressure.  ------------------------------------------------------------------- Measurements   Left ventricle                           Value          Reference  LV ID, ED, PLAX chordal                  44.8  mm       43 - 52  LV ID, ES, PLAX chordal                  24.6  mm       23 - 38  LV fx shortening, PLAX chordal           45    %        >=29  LV PW thickness, ED                      7.35  mm       ----------  IVS/LV PW ratio, ED                      1.09           <=  1.3  LV e&', lateral                           11.3  cm/s     ----------  LV E/e&', lateral                         16.81          ----------  LV e&', medial                            8.59  cm/s     ----------  LV E/e&', medial                          22.12          ----------  LV e&', average                           9.95  cm/s     ----------  LV E/e&', average                         19.11          ----------    Ventricular septum                       Value          Reference  IVS thickness, ED                        8     mm       ----------    LVOT                                     Value          Reference  LVOT peak velocity, S                     106   cm/s     ----------  LVOT mean velocity, S                    69.7  cm/s     ----------  LVOT VTI, S                              23.6  cm       ----------    Aortic valve                             Value          Reference  Aortic valve peak velocity, S            203   cm/s     ----------  Aortic valve mean velocity, S            133   cm/s     ----------  Aortic valve VTI, S                      46.6  cm       ----------  Aortic mean gradient, S                  9     mm Hg    ----------  Aortic peak gradient, S                  16    mm Hg    ----------  VTI ratio, LVOT/AV                       0.51           ----------  Velocity ratio, peak, LVOT/AV            0.52           ----------  Velocity ratio, mean, LVOT/AV            0.52           ----------  Aortic regurg pressure half-time         599   ms       ----------    Aorta                                    Value          Reference  Aortic root ID, ED                       39    mm       ----------    Left atrium                              Value          Reference  LA ID, A-P, ES                           50    mm       ----------  LA ID/bsa, A-P                   (H)     2.5   cm/m^2   <=2.2  LA volume, S                             153   ml       ----------  LA volume/bsa, S                         76.4  ml/m^2   ----------  LA volume, ES, 1-p A4C                   152   ml       ----------  LA volume/bsa, ES, 1-p A4C               75.9  ml/m^2   ----------  LA volume, ES, 1-p A2C                   147   ml       ----------  LA volume/bsa, ES, 1-p A2C               73.4  ml/m^2   ----------    Mitral valve  Value          Reference  Mitral E-wave peak velocity              190   cm/s     ----------  Mitral A-wave peak velocity              71.1  cm/s     ----------  Mitral mean velocity, D                  81.4  cm/s     ----------  Mitral deceleration time                 176   ms        150 - 230  Mitral pressure half-time                79    ms       ----------  Mitral mean gradient, D                  4     mm Hg    ----------  Mitral peak gradient, D                  14    mm Hg    ----------  Mitral E/A ratio, peak                   2.7            ----------  Mitral valve area, PHT, DP               2.78  cm^2     ----------  Mitral valve area/bsa, PHT, DP           1.39  cm^2/m^2 ----------  Mitral annulus VTI, D                    48.7  cm       ----------  Mitral regurg VTI, PISA                  216   cm       ----------    Pulmonary arteries                       Value          Reference  PA pressure, S, DP               (H)     59    mm Hg    <=30    Tricuspid valve                          Value          Reference  Tricuspid regurg peak velocity           373   cm/s     ----------  Tricuspid peak RV-RA gradient            56    mm Hg    ----------    Right atrium                             Value          Reference  RA ID, S-I, ES, A4C              (  H)     78.7  mm       34 - 49  RA area, ES, A4C                 (H)     30.1  cm^2     8.3 - 19.5  RA volume, ES, A/L                       93.2  ml       ----------  RA volume/bsa, ES, A/L                   46.5  ml/m^2   ----------    Right ventricle                          Value          Reference  TAPSE                                    20    mm       ----------  RV s&', lateral, S                        9.64  cm/s     ----------  Legend: (L)  and  (H)  mark values outside specified reference range.  ------------------------------------------------------------------- Prepared and Electronically Authenticated by  Lyman Bishop MD 2019-04-10T14:03:21     Transesophageal Echocardiography  Patient:    April Mueller, April Mueller MR #:       [id number] Study Date: 11/20/2017 Gender:     F Age:        13 Height:     162.6 cm Weight:     83.2 kg BSA:        1.97 m^2 Pt. Status: Room:    SONOGRAPHER  Florentina Jenny, RDCS  ORDERING     [name], M.D.  PERFORMING   [name], M.D.  cc:  ------------------------------------------------------------------- LV EF: 55% -   60%  ------------------------------------------------------------------- Indications:      Mitral regurgitation 424.0.  ------------------------------------------------------------------- Study Conclusions  - Left ventricle: The cavity size was mildly dilated. Wall   thickness was normal. Systolic function was normal. The estimated   ejection fraction was in the range of 55% to 60%. Wall motion was   normal; there were no regional wall motion abnormalities. - Aortic valve: There was no stenosis. There was trivial   regurgitation. - Aorta: S/p ascending aorta replacement. There was residual   dissection noted in the descending thoracic aorta. - Mitral valve: There was severe, eccentric, posteriorly-directed   mitral regurgitation. There appeared to be a partial flail   segment of the anterior leaflet in the region of A2 scallop.   There was systolic flow reversal in the pulmonary vein doppler   signal. Unable to measure PISA given eccentricity of the jet. - Left atrium: The atrium was moderately dilated. No evidence of   thrombus in the atrial cavity or appendage. - Right ventricle: The cavity size was mildly dilated. Systolic   function was mildly reduced. - Right atrium: The atrium was moderately dilated. - Atrial septum: No ASD/PFO by color doppler. - Tricuspid valve: Moderate-severe TR with peak RV-RA gradient 43   mmHg.  Impressions:  - Severe mitral regurgitation, partial flail anterior leaflet  segment (region of A2).  ------------------------------------------------------------------- Study data:   Study status:  Routine.  Consent:  The risks, benefits, and alternatives to the procedure were explained to the patient and informed consent was obtained.  Procedure:  The  patient reported no pain pre or post test. Initial setup. The patient was brought to the laboratory. Surface ECG leads were monitored. Sedation. Conscious sedation was administered by cardiology staff. Transesophageal echocardiography. An adult multiplane transesophageal probe was inserted by the attending cardiologistwithout difficulty. Image quality was adequate.  Study completion:  The patient tolerated the procedure well. There were no complications.  Administered medications:   Fentanyl, 41mcg, IV.  Midazolam, 3.5mg , IV.          Diagnostic transesophageal echocardiography.  2D and color Doppler.  Birthdate:  Patient birthdate: [birth year].  Age:  Patient is 72 yr old.  Sex:  Gender: female.    BMI: 31.5 kg/m^2.  Blood pressure:     110/43  Patient status:  Outpatient.  Study date:  Study date: 11/20/2017. Study time: 09:04 AM.  Location:  Endoscopy.  -------------------------------------------------------------------  ------------------------------------------------------------------- Left ventricle:  The cavity size was mildly dilated. Wall thickness was normal. Systolic function was normal. The estimated ejection fraction was in the range of 55% to 60%. Wall motion was normal; there were no regional wall motion abnormalities.  ------------------------------------------------------------------- Aortic valve:   Trileaflet.  Doppler:   There was no stenosis. There was trivial regurgitation.  ------------------------------------------------------------------- Aorta:  S/p ascending aorta replacement. There was residual dissection noted in the descending thoracic aorta.  ------------------------------------------------------------------- Mitral valve:  There was severe, eccentric, posteriorly-directed mitral regurgitation. There appeared to be a partial flail segment of the anterior leaflet in the region of A2 scallop. There was systolic flow reversal in the pulmonary vein  doppler signal. Unable to measure PISA given eccentricity of the jet.  Doppler:   There was no evidence for stenosis.  ------------------------------------------------------------------- Left atrium:  The atrium was moderately dilated.  No evidence of thrombus in the atrial cavity or appendage.  ------------------------------------------------------------------- Atrial septum:  No ASD/PFO by color doppler.  ------------------------------------------------------------------- Right ventricle:  The cavity size was mildly dilated. Systolic function was mildly reduced.  ------------------------------------------------------------------- Pulmonic valve:    Structurally normal valve.   Cusp separation was normal.  Doppler:  There was trivial regurgitation.  ------------------------------------------------------------------- Tricuspid valve:  Moderate-severe TR with peak RV-RA gradient 43 mmHg.  ------------------------------------------------------------------- Right atrium:  The atrium was moderately dilated.  ------------------------------------------------------------------- Pericardium:  There was no pericardial effusion.   ------------------------------------------------------------------- Post procedure conclusions Ascending Aorta:  - S/p ascending aorta replacement. There was residual dissection   noted in the descending thoracic aorta.  ------------------------------------------------------------------- Prepared and Electronically Authenticated by  [name], M.D. 2019-04-18T11:49:57    Pulmonary Function Tests       FVC  1.03 L  (43% predicted)  FEV1  0.70 L  (38% predicted)  FEF25-75 0.39 L  (22% predicted)   TLC  2.43 L  (48% predicted) RV  1.43 L  (65% predicted) DLCO  26% predicted    STS Risk Calculator  Procedure: MV Repair CALCULATE   Risk of Mortality:  7.647% Renal Failure:  32.104% Permanent Stroke:  1.884% Prolonged  Ventilation:  41.493% DSW Infection:  0.124% Reoperation:  4.720% Morbidity or Mortality:  55.333% Short Length of Stay:  7.443% Long Length of Stay:  33.604%      Impression:  I have personally reviewed the patient's recent transthoracic and transesophageal echocardiograms and pulmonary function tests.  Patient has  stage D severe symptomatic primary mitral regurgitation, moderate to severe tricuspid regurgitation, and long-standing persistent atrial fibrillation not on warfarin anticoagulation.  April Mueller has a long history of chronic hypoxemic respiratory failure for which the patient is extremely limited and oxygen dependent due to a combination of chronic diastolic congestive heart failure, severe COPD, pulmonary hypertension, and obstructive sleep apnea.  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  April Mueller has chronic kidney disease and numerous other comorbid medical problems.  Recently April Mueller has developed worsening tricuspid regurgitation and signs of right heart congestive failure.  I would not consider this patient a candidate for conventional surgical mitral valve repair or replacement with or without concomitant tricuspid valve repair and/or Maze procedure.  Options include continued medical therapy versus palliative MitraClip edge to edge mitral valve repair.  It might be reasonable to consider diagnostic coronary angiography and follow-up right heart catheterization, although this would be associated with risk of nephrotoxicity and may not change long-term management.  The patient has not undergone formal radiographic imaging to follow-up her chronic aortic dissection in many years.  This could be considered but would also be associated with potential for nephrotoxicity, and it seems unlikely that any surgical intervention might be considered if the patient were to be found to have progressive  aneurysmal enlargement or other complications of her aortic dissection.    Plan:  I have discussed the complex nature of the patient's multiple medical problems which contribute to her long-standing struggle with chronic respiratory failure.  We discussed the fact that her recent further progression of symptoms likely represents worsening congestive heart failure.  In that setting we discussed the relative risks and benefits of continued medical therapy versus palliative MitraClip edge to edge mitral valve repair.  We discussed why April Mueller would not be considered a candidate for conventional surgery under any circumstances.  We discussed the presence of her chronic aortic dissection and the fact that follow-up imaging has not been performed for many years.  We discussed that formal radiographic imaging would require some risk of nephrotoxicity.  All of her questions have been addressed.  April Mueller desires to follow-up further with Dr. Burt Knack and pursue possible MitraClip edge to edge mitral valve repair in the near future.  At this time April Mueller is not inclined to evaluate her chronic aortic dissection with any type of formal imaging studies.  I spent in excess of 90 minutes during the conduct of this office consultation and >50% of this time involved direct face-to-face encounter with the patient for counseling and/or coordination of their care.     Valentina Gu. Roxy Manns, MD 12/17/2017 4:18 PM

## 2017-12-17 NOTE — Patient Instructions (Signed)
Continue all previous medications without any changes at this time  

## 2017-12-17 NOTE — Patient Instructions (Addendum)
Based on your breathing tests from 5/1 I believe we should retry an inhaled medication to see if we can improve lung function.  Optimizing this will help repair you for possible mitral valve procedure if you are a good candidate for this. We will perform a CPAP titration study to ensure that your CPAP pressures are adequate to control your sleep apnea.  We will also assess for best mask fit and get to a better mask if one is available. Please continue your oxygen as you have been using it Continue your cardiac and diuretic medications and follow with doctors Aundra Dubin and Roxy Manns as scheduled. Follow with Dr Lamonte Sakai in 4-6 weeks to discuss your progress on the new medication and to review your CPAP testing.

## 2018-01-02 ENCOUNTER — Encounter: Payer: Self-pay | Admitting: Cardiovascular Disease

## 2018-01-02 ENCOUNTER — Ambulatory Visit: Payer: Medicare Other | Admitting: Cardiovascular Disease

## 2018-01-02 VITALS — BP 102/80 | HR 51 | Ht 64.0 in | Wt 182.0 lb

## 2018-01-02 DIAGNOSIS — I5032 Chronic diastolic (congestive) heart failure: Secondary | ICD-10-CM | POA: Diagnosis not present

## 2018-01-02 DIAGNOSIS — I340 Nonrheumatic mitral (valve) insufficiency: Secondary | ICD-10-CM | POA: Diagnosis not present

## 2018-01-02 MED ORDER — METOLAZONE 2.5 MG PO TABS: ORAL_TABLET | ORAL | 11 refills | Status: DC

## 2018-01-02 NOTE — H&P (View-Only) (Signed)
Cardiology Office Note Date:  01/09/2018   ID:  April Mueller, DOB 1946/05/31, MRN [id number]  PCP:  Lawerance Cruel, MD  Cardiologist:  [name], MD  Chief Complaint  Patient presents with  . Shortness of Breath     History of Present Illness: April Mueller is a 72 y.o. female who presents for follow-up evaluation for severe mitral regurgitation.  Patient has a complex medical history that includes permanent atrial fibrillation not on anticoagulation, severe mitral and tricuspid regurgitation, chronic diastolic heart failure, pulmonary hypertension, chronic hypoxemic respiratory failure on home oxygen, and history of emergent surgical repair of a type a aortic dissection.  She has had recurrent GI bleeding on anticoagulant drugs.  The patient was initially seen for consideration of MitraClip and she has subsequently been referred for formal cardiac surgical evaluation.  She is not a candidate for surgical mitral and-or tricuspid valve repair or replacement.  She has too many severe comorbid medical conditions.  She returns today to discuss consideration of percutaneous edge to edge of mitral valve repair with MitraClip for palliation of severe mitral regurgitation.  The patient is here with her caregiver. She reports worsening leg swelling and shortness of breath over the past 1-2 weeks. She has had no dietary changes. She complains of orthopnea as well. No chest pain, lightheadedness, or syncope. No other complaints. Continues to take her diuretics as directed.   Past Medical History:  Diagnosis Date  . Anemia 03/23/15   transfusion  . Anxiety   . Aortic aneurysm ([place])   . Arthritis    "qwhere" (02/20/2016)  . CHF (congestive heart failure) ([place])   . Chronic lower back pain   . Chronic thoracic aortic dissection (HCC)    S/P repair ascending thoracic aortic dissection with chronic residual dissection involving transverse aortic arch and descending thoracic aorta  . CKD  (chronic kidney disease) stage 3, GFR 30-59 ml/min (HCC)    "on dialysis for 3 months after my aneurysm in 2005" (02/20/2016)  . COPD (chronic obstructive pulmonary disease) ([place])   . Gout   . Heart murmur   . History of blood transfusion "several"  . Hypertension   . Lupus ([place])   . On home oxygen therapy    "3L; 24/7" (02/20/2016)  . OSA on CPAP   . Pulmonary HTN ([place])   . Rheumatoid arthritis(714.0) 11/06/2012  . S/P aortic dissection repair   . Tuberculosis    dormant carrier    Past Surgical History:  Procedure Laterality Date  . BREAST EXCISIONAL BIOPSY Right    benign  . BREAST LUMPECTOMY Right   . CARDIAC CATHETERIZATION N/A 10/24/2015   Procedure: Right Heart Cath;  Surgeon: Larey Dresser, MD;  Location: [facility];  Service: Cardiovascular;  Laterality: N/A;  . CATARACT EXTRACTION W/ INTRAOCULAR LENS  IMPLANT, BILATERAL Bilateral 2016  . COLONOSCOPY N/A 08/19/2015   Procedure: COLONOSCOPY;  Surgeon: Jerene Bears, MD;  Location: [facility] ENDOSCOPY;  Service: Endoscopy;  Laterality: N/A;  . ENTEROSCOPY N/A 02/22/2016   Procedure: ENTEROSCOPY;  Surgeon: Milus Banister, MD;  Location: [facility];  Service: Endoscopy;  Laterality: N/A;  . ESOPHAGOGASTRODUODENOSCOPY N/A 08/19/2015   Procedure: ESOPHAGOGASTRODUODENOSCOPY (EGD);  Surgeon: Jerene Bears, MD;  Location: [facility] ENDOSCOPY;  Service: Endoscopy;  Laterality: N/A;  patient scheduled, anesthesia aware of 1500 case, per [place].  08/18/15 DP  . GIVENS CAPSULE STUDY N/A 08/21/2015   Procedure: GIVENS CAPSULE STUDY;  Surgeon: Gatha Mayer, MD;  Location: [facility] ENDOSCOPY;  Service: Endoscopy;  Laterality: N/A;  . HEMORRHOID SURGERY    . REPAIR OF ACUTE ASCENDING THORACIC AORTIC DISSECTION  2005  . RIGHT HEART CATHETERIZATION N/A 08/18/2014   Procedure: RIGHT HEART CATH;  Surgeon: Jolaine Artist, MD;  Location: [facility] CATH LAB;  Service: Cardiovascular;  Laterality: N/A;  . TEE WITHOUT CARDIOVERSION N/A 01/05/2015   Procedure:  TRANSESOPHAGEAL ECHOCARDIOGRAM (TEE);  Surgeon: Larey Dresser, MD;  Location: [facility];  Service: Cardiovascular;  Laterality: N/A;  . TEE WITHOUT CARDIOVERSION N/A 11/20/2017   Procedure: TRANSESOPHAGEAL ECHOCARDIOGRAM (TEE);  Surgeon: Larey Dresser, MD;  Location: [facility] ENDOSCOPY;  Service: Cardiovascular;  Laterality: N/A;  . WISDOM TOOTH EXTRACTION      Current Outpatient Medications  Medication Sig Dispense Refill  . acetaminophen (TYLENOL) 500 MG tablet Take 1,000 mg by mouth every 6 (six) hours as needed for moderate pain or headache.    . allopurinol (ZYLOPRIM) 300 MG tablet Take 300 mg by mouth daily.    [name] ALPRAZolam (XANAX) 0.25 MG tablet Take 1 tablet (0.25 mg total) by mouth every 12 (twelve) hours as needed for anxiety or sleep. (Patient taking differently: Take 0.25 mg by mouth at bedtime. ) 60 tablet 5  . atorvastatin (LIPITOR) 10 MG tablet Take 1 tablet (10 mg total) by mouth daily. 90 tablet 2  . Biotin 5000 MCG CAPS Take 5,000 mcg by mouth daily.     . hydroxychloroquine (PLAQUENIL) 200 MG tablet Take 200 mg by mouth 2 (two) times daily.    . metoprolol succinate (TOPROL-XL) 25 MG 24 hr tablet Take 1 tablet (25 mg total) by mouth 2 (two) times daily. 180 tablet 3  . Omega-3 Fatty Acids (FISH OIL) 1200 MG CAPS Take 1,200 mg by mouth daily.    . OXYGEN Inhale 3 L into the lungs continuous.     . Polyethylene Glycol 400 (BLINK TEARS OP) Place 1-2 drops into both eyes at bedtime.    . Tiotropium Bromide-Olodaterol (STIOLTO RESPIMAT) 2.5-2.5 MCG/ACT AERS Inhale 2 puffs into the lungs daily. 2 Inhaler 0  . torsemide (DEMADEX) 20 MG tablet Take 4 tablets (80 mg total) by mouth every morning AND 3 tablets (60 mg total) every evening. 210 tablet 3  . metolazone (ZAROXOLYN) 2.5 MG tablet Take 1 tablet (2.5 mg total) by mouth 2 (two) times a week. Every Saturday and Wednesday 15 tablet 2  . potassium chloride (K-DUR,KLOR-CON) 10 MEQ tablet Take 2 tablets (20 mEq total) by mouth 2  (two) times daily. Take an extra tab on Saturday and Wednesday when you take Metolazone 135 tablet 3   No current facility-administered medications for this visit.     Allergies:   Patient has no known allergies.   Social History:  The patient  reports that she quit smoking about 14 years ago. Her smoking use included cigarettes. She has a 21.00 pack-year smoking history. She has never used smokeless tobacco. She reports that she drinks alcohol. She reports that she does not use drugs.   Family History:  The patient's family history includes Breast cancer in her maternal aunt; Diabetes in her brother; Heart attack in her mother; Kidney failure in her brother; Prostate cancer in her father.    ROS:  Please see the history of present illness.  All other systems are reviewed and negative.    PHYSICAL EXAM: VS:  BP 102/80   Pulse (!) 51   Ht 5\' 4"  (1.626 m)   Wt 182 lb (82.6  kg)   SpO2 (!) 87%   BMI 31.24 kg/m  , BMI Body mass index is 31.24 kg/m. GEN: elderly appearing woman, in no acute distress  HEENT: normal  Neck: positive JVD, no masses. No carotid bruits Cardiac: RRR with 3/6 systolic murmur at the apex                Respiratory:  clear to auscultation bilaterally, normal work of breathing GI: soft, nontender, nondistended, + BS MS: no deformity or atrophy  Ext: 2+ bilateral pretibial edema Skin: warm and dry, no rash Neuro:  Strength and sensation are intact Psych: euthymic mood, full affect  EKG:  EKG is not ordered today.  Recent Labs: 06/11/2017: ALT 21 11/12/2017: Hemoglobin 10.2; Platelets 74 01/07/2018: BUN 56; Creatinine, Ser 3.02; Potassium 3.6; Sodium 143   Lipid Panel     Component Value Date/Time   CHOL 105 11/12/2017 1012   CHOL 114 04/02/2016 0909   TRIG 45 11/12/2017 1012   HDL 53 11/12/2017 1012   HDL 61 04/02/2016 0909   CHOLHDL 2.0 11/12/2017 1012   VLDL 9 11/12/2017 1012   LDLCALC 43 11/12/2017 1012   LDLCALC 43 04/02/2016 0909      Wt  Readings from Last 3 Encounters:  01/07/18 184 lb 4 oz (83.6 kg)  01/02/18 182 lb (82.6 kg)  12/17/17 183 lb (83 kg)     Cardiac Studies Reviewed: 2D Echo: Study Conclusions  - Left ventricle: The cavity size was normal. Wall thickness was   normal. Systolic function was vigorous. The estimated ejection   fraction was in the range of 65% to 70%. Wall motion was normal;   there were no regional wall motion abnormalities. Doppler   parameters are consistent with restrictive left ventricular   relaxation (grade 3 diastolic dysfunction). The E/e&' ratio is   >20, suggesting markedly elevated LV filling pressure. - Aortic valve: There was trivial regurgitation. - Aorta: Aortic root dimension: 39 mm (ED). - Aortic root: The aortic root is dilated. - Mitral valve: Late prolapse of the AMVL. Moderate to severe   eccentric MR - tracking posterolaterally. - Left atrium: Severely dilated. - Right ventricle: The cavity size was mildly dilated. Mildly   reduced systolic function. - Right atrium: Severely dilated. - Atrial septum: Type 1R atrial septal aneurysm - bows right to   left, suggestive of high LA pressure. - Tricuspid valve: There was moderate to severe regurgitation. - Pulmonary arteries: PA peak pressure: 59 mm Hg (S). - Inferior vena cava: The vessel was dilated. The respirophasic   diameter changes were blunted (< 50%), consistent with elevated   central venous pressure.  Impressions:  - Compared to a prior study in 2017, the LVEF is higher at 65-70%,   there is restrictive diastology with elevated LV filling   pressure. The degree of MR is likely severe and eccentric,   tracking along the posterolateral wall with anterior leaflet   prolapse - the LA is severely dilated as is the RA. There is   moderate to severe TR. RVSP is 59 mmHg with a dilated IVC.   Recommend TEE to further evaluate MR.  TEE: Study Conclusions  - Left ventricle: The cavity size was mildly  dilated. Wall   thickness was normal. Systolic function was normal. The estimated   ejection fraction was in the range of 55% to 60%. Wall motion was   normal; there were no regional wall motion abnormalities. - Aortic valve: There was no stenosis. There was  trivial   regurgitation. - Aorta: S/p ascending aorta replacement. There was residual   dissection noted in the descending thoracic aorta. - Mitral valve: There was severe, eccentric, posteriorly-directed   mitral regurgitation. There appeared to be a partial flail   segment of the anterior leaflet in the region of A2 scallop.   There was systolic flow reversal in the pulmonary vein doppler   signal. Unable to measure PISA given eccentricity of the jet. - Left atrium: The atrium was moderately dilated. No evidence of   thrombus in the atrial cavity or appendage. - Right ventricle: The cavity size was mildly dilated. Systolic   function was mildly reduced. - Right atrium: The atrium was moderately dilated. - Atrial septum: No ASD/PFO by color doppler. - Tricuspid valve: Moderate-severe TR with peak RV-RA gradient 43   mmHg.  Impressions:  - Severe mitral regurgitation, partial flail anterior leaflet   segment (region of A2).  STS Risk Calculator (MV Replacement) Risk of Mortality: 27.545% Renal Failure: 40.591% Permanent Stroke: 1.687% Prolonged Ventilation: 60.040% DSW Infection: 0.466% Reoperation: 13.401% Morbidity or Mortality: 77.265% Short Length of Stay: 2.518% Long Length of Stay:  STS RIsk Calculator (MV Repair) Risk of Mortality: 14.443% Renal Failure: 59.884% Permanent Stroke: 2.430% Prolonged Ventilation: 50.027% DSW Infection: 0.243% Reoperation: 5.752% Morbidity or Mortality: 68.369% Short Length of Stay: 5.467% Long Length of Stay:  ASSESSMENT AND PLAN: 72 yo woman with severe, stage D, symptomatic mitral regurgitation secondary to primary dysfunction of the mitral valve apparatus  (Primary MR). The patient has degenerative MR with a partially flail A2 segment and severe posteriorly directed eccentric MR. She has multiple severe comorbid medical conditions previously outlined with home O2 dependence, CKD 4, severe COPD, and previous repair of a Type A aortic dissection. She has undergone formal evaluation by a multidisciplinary heart team including cardiac surgery and advanced heart failure specialists. We agree that percutaneous mitral valve repair with Mitraclip is a reasonable treatment option for this patient.   I have reviewed the risks of percutaneous mitral valve repair with the patient today. These risks include but are not limited to stroke, MI, vascular injury, infection, arrhythmia, cardiac perforation, tamponade, emergency surgery, device embolization, and death. After review of expected benefit, risks, and indications, she provides full informed consent for the procedure.   She presents today with progressive dyspnea and volume overload, consistent with acute on chronic diastolic heart failure. I have recommended metolazone 2.5 mg today prior to taking demadex 80 mg. She will otherwise continue on her current diuretic regimen and follow-up closely with Dr Aundra Dubin. She understands to call EMS if her symptoms worsen.  Current medicines are reviewed with the patient today.  The patient does not have concerns regarding medicines.  Labs/ tests ordered today include:  No orders of the defined types were placed in this encounter.   Disposition:   Mitraclip surgery January 29, 2018  Deatra James, MD  01/09/2018 5:42 AM    [facility] [place], [place],   [address] Phone: [phone]; Fax: [phone]

## 2018-01-02 NOTE — Progress Notes (Signed)
Cardiology Office Note Date:  01/09/2018   ID:  April Mueller, DOB 08-06-1946, MRN [id number]  PCP:  Lawerance Cruel, MD  Cardiologist:  [name], MD  Chief Complaint  Patient presents with  . Shortness of Breath     History of Present Illness: April Mueller is a 72 y.o. female who presents for follow-up evaluation for severe mitral regurgitation.  Patient has a complex medical history that includes permanent atrial fibrillation not on anticoagulation, severe mitral and tricuspid regurgitation, chronic diastolic heart failure, pulmonary hypertension, chronic hypoxemic respiratory failure on home oxygen, and history of emergent surgical repair of a type a aortic dissection.  She has had recurrent GI bleeding on anticoagulant drugs.  The patient was initially seen for consideration of MitraClip and she has subsequently been referred for formal cardiac surgical evaluation.  She is not a candidate for surgical mitral and-or tricuspid valve repair or replacement.  She has too many severe comorbid medical conditions.  She returns today to discuss consideration of percutaneous edge to edge of mitral valve repair with MitraClip for palliation of severe mitral regurgitation.  The patient is here with her caregiver. She reports worsening leg swelling and shortness of breath over the past 1-2 weeks. She has had no dietary changes. She complains of orthopnea as well. No chest pain, lightheadedness, or syncope. No other complaints. Continues to take her diuretics as directed.   Past Medical History:  Diagnosis Date  . Anemia 03/23/15   transfusion  . Anxiety   . Aortic aneurysm ([place])   . Arthritis    "qwhere" (02/20/2016)  . CHF (congestive heart failure) ([place])   . Chronic lower back pain   . Chronic thoracic aortic dissection (HCC)    S/P repair ascending thoracic aortic dissection with chronic residual dissection involving transverse aortic arch and descending thoracic aorta  . CKD  (chronic kidney disease) stage 3, GFR 30-59 ml/min (HCC)    "on dialysis for 3 months after my aneurysm in 2005" (02/20/2016)  . COPD (chronic obstructive pulmonary disease) ([place])   . Gout   . Heart murmur   . History of blood transfusion "several"  . Hypertension   . Lupus ([place])   . On home oxygen therapy    "3L; 24/7" (02/20/2016)  . OSA on CPAP   . Pulmonary HTN ([place])   . Rheumatoid arthritis(714.0) 11/06/2012  . S/P aortic dissection repair   . Tuberculosis    dormant carrier    Past Surgical History:  Procedure Laterality Date  . BREAST EXCISIONAL BIOPSY Right    benign  . BREAST LUMPECTOMY Right   . CARDIAC CATHETERIZATION N/A 10/24/2015   Procedure: Right Heart Cath;  Surgeon: Larey Dresser, MD;  Location: [facility];  Service: Cardiovascular;  Laterality: N/A;  . CATARACT EXTRACTION W/ INTRAOCULAR LENS  IMPLANT, BILATERAL Bilateral 2016  . COLONOSCOPY N/A 08/19/2015   Procedure: COLONOSCOPY;  Surgeon: Jerene Bears, MD;  Location: [facility] ENDOSCOPY;  Service: Endoscopy;  Laterality: N/A;  . ENTEROSCOPY N/A 02/22/2016   Procedure: ENTEROSCOPY;  Surgeon: Milus Banister, MD;  Location: [facility];  Service: Endoscopy;  Laterality: N/A;  . ESOPHAGOGASTRODUODENOSCOPY N/A 08/19/2015   Procedure: ESOPHAGOGASTRODUODENOSCOPY (EGD);  Surgeon: Jerene Bears, MD;  Location: [facility] ENDOSCOPY;  Service: Endoscopy;  Laterality: N/A;  patient scheduled, anesthesia aware of 1500 case, per [place].  08/18/15 DP  . GIVENS CAPSULE STUDY N/A 08/21/2015   Procedure: GIVENS CAPSULE STUDY;  Surgeon: Gatha Mayer, MD;  Location: [facility] ENDOSCOPY;  Service: Endoscopy;  Laterality: N/A;  . HEMORRHOID SURGERY    . REPAIR OF ACUTE ASCENDING THORACIC AORTIC DISSECTION  2005  . RIGHT HEART CATHETERIZATION N/A 08/18/2014   Procedure: RIGHT HEART CATH;  Surgeon: Jolaine Artist, MD;  Location: [facility] CATH LAB;  Service: Cardiovascular;  Laterality: N/A;  . TEE WITHOUT CARDIOVERSION N/A 01/05/2015   Procedure:  TRANSESOPHAGEAL ECHOCARDIOGRAM (TEE);  Surgeon: Larey Dresser, MD;  Location: [facility];  Service: Cardiovascular;  Laterality: N/A;  . TEE WITHOUT CARDIOVERSION N/A 11/20/2017   Procedure: TRANSESOPHAGEAL ECHOCARDIOGRAM (TEE);  Surgeon: Larey Dresser, MD;  Location: [facility] ENDOSCOPY;  Service: Cardiovascular;  Laterality: N/A;  . WISDOM TOOTH EXTRACTION      Current Outpatient Medications  Medication Sig Dispense Refill  . acetaminophen (TYLENOL) 500 MG tablet Take 1,000 mg by mouth every 6 (six) hours as needed for moderate pain or headache.    . allopurinol (ZYLOPRIM) 300 MG tablet Take 300 mg by mouth daily.    [name] ALPRAZolam (XANAX) 0.25 MG tablet Take 1 tablet (0.25 mg total) by mouth every 12 (twelve) hours as needed for anxiety or sleep. (Patient taking differently: Take 0.25 mg by mouth at bedtime. ) 60 tablet 5  . atorvastatin (LIPITOR) 10 MG tablet Take 1 tablet (10 mg total) by mouth daily. 90 tablet 2  . Biotin 5000 MCG CAPS Take 5,000 mcg by mouth daily.     . hydroxychloroquine (PLAQUENIL) 200 MG tablet Take 200 mg by mouth 2 (two) times daily.    . metoprolol succinate (TOPROL-XL) 25 MG 24 hr tablet Take 1 tablet (25 mg total) by mouth 2 (two) times daily. 180 tablet 3  . Omega-3 Fatty Acids (FISH OIL) 1200 MG CAPS Take 1,200 mg by mouth daily.    . OXYGEN Inhale 3 L into the lungs continuous.     . Polyethylene Glycol 400 (BLINK TEARS OP) Place 1-2 drops into both eyes at bedtime.    . Tiotropium Bromide-Olodaterol (STIOLTO RESPIMAT) 2.5-2.5 MCG/ACT AERS Inhale 2 puffs into the lungs daily. 2 Inhaler 0  . torsemide (DEMADEX) 20 MG tablet Take 4 tablets (80 mg total) by mouth every morning AND 3 tablets (60 mg total) every evening. 210 tablet 3  . metolazone (ZAROXOLYN) 2.5 MG tablet Take 1 tablet (2.5 mg total) by mouth 2 (two) times a week. Every Saturday and Wednesday 15 tablet 2  . potassium chloride (K-DUR,KLOR-CON) 10 MEQ tablet Take 2 tablets (20 mEq total) by mouth 2  (two) times daily. Take an extra tab on Saturday and Wednesday when you take Metolazone 135 tablet 3   No current facility-administered medications for this visit.     Allergies:   Patient has no known allergies.   Social History:  The patient  reports that she quit smoking about 14 years ago. Her smoking use included cigarettes. She has a 21.00 pack-year smoking history. She has never used smokeless tobacco. She reports that she drinks alcohol. She reports that she does not use drugs.   Family History:  The patient's family history includes Breast cancer in her maternal aunt; Diabetes in her brother; Heart attack in her mother; Kidney failure in her brother; Prostate cancer in her father.    ROS:  Please see the history of present illness.  All other systems are reviewed and negative.    PHYSICAL EXAM: VS:  BP 102/80   Pulse (!) 51   Ht 5\' 4"  (1.626 m)   Wt 182 lb (82.6  kg)   SpO2 (!) 87%   BMI 31.24 kg/m  , BMI Body mass index is 31.24 kg/m. GEN: elderly appearing woman, in no acute distress  HEENT: normal  Neck: positive JVD, no masses. No carotid bruits Cardiac: RRR with 3/6 systolic murmur at the apex                Respiratory:  clear to auscultation bilaterally, normal work of breathing GI: soft, nontender, nondistended, + BS MS: no deformity or atrophy  Ext: 2+ bilateral pretibial edema Skin: warm and dry, no rash Neuro:  Strength and sensation are intact Psych: euthymic mood, full affect  EKG:  EKG is not ordered today.  Recent Labs: 06/11/2017: ALT 21 11/12/2017: Hemoglobin 10.2; Platelets 74 01/07/2018: BUN 56; Creatinine, Ser 3.02; Potassium 3.6; Sodium 143   Lipid Panel     Component Value Date/Time   CHOL 105 11/12/2017 1012   CHOL 114 04/02/2016 0909   TRIG 45 11/12/2017 1012   HDL 53 11/12/2017 1012   HDL 61 04/02/2016 0909   CHOLHDL 2.0 11/12/2017 1012   VLDL 9 11/12/2017 1012   LDLCALC 43 11/12/2017 1012   LDLCALC 43 04/02/2016 0909      Wt  Readings from Last 3 Encounters:  01/07/18 184 lb 4 oz (83.6 kg)  01/02/18 182 lb (82.6 kg)  12/17/17 183 lb (83 kg)     Cardiac Studies Reviewed: 2D Echo: Study Conclusions  - Left ventricle: The cavity size was normal. Wall thickness was   normal. Systolic function was vigorous. The estimated ejection   fraction was in the range of 65% to 70%. Wall motion was normal;   there were no regional wall motion abnormalities. Doppler   parameters are consistent with restrictive left ventricular   relaxation (grade 3 diastolic dysfunction). The E/e&' ratio is   >20, suggesting markedly elevated LV filling pressure. - Aortic valve: There was trivial regurgitation. - Aorta: Aortic root dimension: 39 mm (ED). - Aortic root: The aortic root is dilated. - Mitral valve: Late prolapse of the AMVL. Moderate to severe   eccentric MR - tracking posterolaterally. - Left atrium: Severely dilated. - Right ventricle: The cavity size was mildly dilated. Mildly   reduced systolic function. - Right atrium: Severely dilated. - Atrial septum: Type 1R atrial septal aneurysm - bows right to   left, suggestive of high LA pressure. - Tricuspid valve: There was moderate to severe regurgitation. - Pulmonary arteries: PA peak pressure: 59 mm Hg (S). - Inferior vena cava: The vessel was dilated. The respirophasic   diameter changes were blunted (< 50%), consistent with elevated   central venous pressure.  Impressions:  - Compared to a prior study in 2017, the LVEF is higher at 65-70%,   there is restrictive diastology with elevated LV filling   pressure. The degree of MR is likely severe and eccentric,   tracking along the posterolateral wall with anterior leaflet   prolapse - the LA is severely dilated as is the RA. There is   moderate to severe TR. RVSP is 59 mmHg with a dilated IVC.   Recommend TEE to further evaluate MR.  TEE: Study Conclusions  - Left ventricle: The cavity size was mildly  dilated. Wall   thickness was normal. Systolic function was normal. The estimated   ejection fraction was in the range of 55% to 60%. Wall motion was   normal; there were no regional wall motion abnormalities. - Aortic valve: There was no stenosis. There was  trivial   regurgitation. - Aorta: S/p ascending aorta replacement. There was residual   dissection noted in the descending thoracic aorta. - Mitral valve: There was severe, eccentric, posteriorly-directed   mitral regurgitation. There appeared to be a partial flail   segment of the anterior leaflet in the region of A2 scallop.   There was systolic flow reversal in the pulmonary vein doppler   signal. Unable to measure PISA given eccentricity of the jet. - Left atrium: The atrium was moderately dilated. No evidence of   thrombus in the atrial cavity or appendage. - Right ventricle: The cavity size was mildly dilated. Systolic   function was mildly reduced. - Right atrium: The atrium was moderately dilated. - Atrial septum: No ASD/PFO by color doppler. - Tricuspid valve: Moderate-severe TR with peak RV-RA gradient 43   mmHg.  Impressions:  - Severe mitral regurgitation, partial flail anterior leaflet   segment (region of A2).  STS Risk Calculator (MV Replacement) Risk of Mortality: 27.545% Renal Failure: 40.591% Permanent Stroke: 1.687% Prolonged Ventilation: 60.040% DSW Infection: 0.466% Reoperation: 13.401% Morbidity or Mortality: 77.265% Short Length of Stay: 2.518% Long Length of Stay:  STS RIsk Calculator (MV Repair) Risk of Mortality: 14.443% Renal Failure: 59.884% Permanent Stroke: 2.430% Prolonged Ventilation: 50.027% DSW Infection: 0.243% Reoperation: 5.752% Morbidity or Mortality: 68.369% Short Length of Stay: 5.467% Long Length of Stay:  ASSESSMENT AND PLAN: 72 yo woman with severe, stage D, symptomatic mitral regurgitation secondary to primary dysfunction of the mitral valve apparatus  (Primary MR). The patient has degenerative MR with a partially flail A2 segment and severe posteriorly directed eccentric MR. She has multiple severe comorbid medical conditions previously outlined with home O2 dependence, CKD 4, severe COPD, and previous repair of a Type A aortic dissection. She has undergone formal evaluation by a multidisciplinary heart team including cardiac surgery and advanced heart failure specialists. We agree that percutaneous mitral valve repair with Mitraclip is a reasonable treatment option for this patient.   I have reviewed the risks of percutaneous mitral valve repair with the patient today. These risks include but are not limited to stroke, MI, vascular injury, infection, arrhythmia, cardiac perforation, tamponade, emergency surgery, device embolization, and death. After review of expected benefit, risks, and indications, she provides full informed consent for the procedure.   She presents today with progressive dyspnea and volume overload, consistent with acute on chronic diastolic heart failure. I have recommended metolazone 2.5 mg today prior to taking demadex 80 mg. She will otherwise continue on her current diuretic regimen and follow-up closely with Dr Aundra Dubin. She understands to call EMS if her symptoms worsen.  Current medicines are reviewed with the patient today.  The patient does not have concerns regarding medicines.  Labs/ tests ordered today include:  No orders of the defined types were placed in this encounter.   Disposition:   Mitraclip surgery January 29, 2018  Deatra James, MD  01/09/2018 5:42 AM    [facility] [place], [place], [place]  [address] Phone: [phone]; Fax: [phone]

## 2018-01-02 NOTE — Patient Instructions (Addendum)
Medication Instructions:  1) Please take Aspirin 81 mg daily if you feel comfortable. 2) You have been given a prescription for Metolazone 2.5 mg. You may take 1 tablet twice a week as needed for swelling and shortness of breath. 3) take one Metolazone 2.5 mg tablet when you get home. Wait 20 minutes and take your Demadex 80 mg. Call us tomorrow if you are not feeling any better!  Labwork: None  Testing/Procedures: None  Follow-Up: Please call us tomorrow if you are not feeling better.   Lauren will call you to discuss MitraClip scheduling.  A note is being sent to Dr. Claris Gladden office to see if he can see you earlier.   Any Other Special Instructions Will Be Listed Below (If Applicable).     If you need a refill on your cardiac medications before your next appointment, please call your pharmacy.

## 2018-01-07 ENCOUNTER — Encounter (HOSPITAL_COMMUNITY): Payer: Self-pay | Admitting: Cardiology

## 2018-01-07 ENCOUNTER — Other Ambulatory Visit: Payer: Self-pay

## 2018-01-07 ENCOUNTER — Ambulatory Visit (HOSPITAL_COMMUNITY)
Admission: RE | Admit: 2018-01-07 | Discharge: 2018-01-07 | Disposition: A | Discharge status: Home / Self Care | Payer: Medicare Other | Source: Ambulatory Visit | Attending: Cardiology | Admitting: Cardiology

## 2018-01-07 VITALS — BP 94/52 | HR 60 | Wt 184.2 lb

## 2018-01-07 DIAGNOSIS — Z9981 Dependence on supplemental oxygen: Secondary | ICD-10-CM | POA: Insufficient documentation

## 2018-01-07 DIAGNOSIS — Z833 Family history of diabetes mellitus: Secondary | ICD-10-CM | POA: Insufficient documentation

## 2018-01-07 DIAGNOSIS — Z8249 Family history of ischemic heart disease and other diseases of the circulatory system: Secondary | ICD-10-CM | POA: Insufficient documentation

## 2018-01-07 DIAGNOSIS — D696 Thrombocytopenia, unspecified: Secondary | ICD-10-CM | POA: Diagnosis not present

## 2018-01-07 DIAGNOSIS — Z803 Family history of malignant neoplasm of breast: Secondary | ICD-10-CM | POA: Diagnosis not present

## 2018-01-07 DIAGNOSIS — J9610 Chronic respiratory failure, unspecified whether with hypoxia or hypercapnia: Secondary | ICD-10-CM | POA: Insufficient documentation

## 2018-01-07 DIAGNOSIS — E669 Obesity, unspecified: Secondary | ICD-10-CM | POA: Insufficient documentation

## 2018-01-07 DIAGNOSIS — I340 Nonrheumatic mitral (valve) insufficiency: Secondary | ICD-10-CM | POA: Insufficient documentation

## 2018-01-07 DIAGNOSIS — I5032 Chronic diastolic (congestive) heart failure: Secondary | ICD-10-CM | POA: Diagnosis not present

## 2018-01-07 DIAGNOSIS — Z87891 Personal history of nicotine dependence: Secondary | ICD-10-CM | POA: Insufficient documentation

## 2018-01-07 DIAGNOSIS — Z841 Family history of disorders of kidney and ureter: Secondary | ICD-10-CM | POA: Diagnosis not present

## 2018-01-07 DIAGNOSIS — I130 Hypertensive heart and chronic kidney disease with heart failure and stage 1 through stage 4 chronic kidney disease, or unspecified chronic kidney disease: Secondary | ICD-10-CM | POA: Diagnosis present

## 2018-01-07 DIAGNOSIS — I272 Pulmonary hypertension, unspecified: Secondary | ICD-10-CM | POA: Diagnosis not present

## 2018-01-07 DIAGNOSIS — G4733 Obstructive sleep apnea (adult) (pediatric): Secondary | ICD-10-CM | POA: Insufficient documentation

## 2018-01-07 DIAGNOSIS — Z800 Family history of malignant neoplasm of digestive organs: Secondary | ICD-10-CM | POA: Insufficient documentation

## 2018-01-07 DIAGNOSIS — M069 Rheumatoid arthritis, unspecified: Secondary | ICD-10-CM | POA: Insufficient documentation

## 2018-01-07 DIAGNOSIS — J449 Chronic obstructive pulmonary disease, unspecified: Secondary | ICD-10-CM | POA: Insufficient documentation

## 2018-01-07 DIAGNOSIS — Z8042 Family history of malignant neoplasm of prostate: Secondary | ICD-10-CM | POA: Insufficient documentation

## 2018-01-07 DIAGNOSIS — I5030 Unspecified diastolic (congestive) heart failure: Secondary | ICD-10-CM | POA: Insufficient documentation

## 2018-01-07 DIAGNOSIS — D649 Anemia, unspecified: Secondary | ICD-10-CM | POA: Insufficient documentation

## 2018-01-07 DIAGNOSIS — I481 Persistent atrial fibrillation: Secondary | ICD-10-CM | POA: Diagnosis not present

## 2018-01-07 DIAGNOSIS — N184 Chronic kidney disease, stage 4 (severe): Secondary | ICD-10-CM | POA: Insufficient documentation

## 2018-01-07 DIAGNOSIS — Z79899 Other long term (current) drug therapy: Secondary | ICD-10-CM | POA: Diagnosis not present

## 2018-01-07 DIAGNOSIS — I482 Chronic atrial fibrillation, unspecified: Secondary | ICD-10-CM

## 2018-01-07 LAB — BASIC METABOLIC PANEL
ANION GAP: 10 (ref 5–15)
BUN: 56 mg/dL — AB (ref 6–20)
CHLORIDE: 100 mmol/L — AB (ref 101–111)
CO2: 33 mmol/L — ABNORMAL HIGH (ref 22–32)
Calcium: 9.3 mg/dL (ref 8.9–10.3)
Creatinine, Ser: 3.02 mg/dL — ABNORMAL HIGH (ref 0.44–1.00)
GFR calc Af Amer: 17 mL/min — ABNORMAL LOW (ref >60)
GFR, EST NON AFRICAN AMERICAN: 15 mL/min — AB (ref >60)
Glucose, Bld: 94 mg/dL (ref 65–99)
POTASSIUM: 3.6 mmol/L (ref 3.5–5.1)
SODIUM: 143 mmol/L (ref 135–145)

## 2018-01-07 MED ORDER — POTASSIUM CHLORIDE CRYS ER 10 MEQ PO TBCR
20.0000 meq | EXTENDED_RELEASE_TABLET | Freq: Two times a day (BID) | ORAL | 3 refills | Status: DC
Start: 2018-01-07 — End: 2018-02-07

## 2018-01-07 MED ORDER — METOLAZONE 2.5 MG PO TABS: 2.5000 mg | ORAL_TABLET | ORAL | 2 refills | Status: DC

## 2018-01-07 NOTE — Patient Instructions (Signed)
Take Metolazone 2.5 mg EVERY Saturday AND Wednesday   Take an extra 20 meq (1 tab) of Potassium every Saturday and Wednesday when you take Metolazone  Labs today  Keep appointment as scheduled on 01/17/18

## 2018-01-09 NOTE — Progress Notes (Signed)
Patient ID: April Mueller, female   DOB: 08/08/1945, 72 y.o.   MRN: [id number]   HPI  PCP: Dr Harrington Challenger Pulmonary: Dr Byrum/Dr Melvyn Novas  Cardiology: Dr. Aundra Dubin  April Mueller is a 72 y.o. with COPD on home oxygen, rheumatoid arthritis, chronic atrial fibrillation, chronic diastolic CHF, CKD.    Moved back from [place] and saw Dr. Lamonte Sakai. In [place] was followed by cardiology and pulmonary. Started on Tracleer in 2006. Revatio was added, however was discontinued without any change in her symptoms. Stopped smoking in January 2005 when she had aortic aneurysm repair. Smoked 1.5-2 ppd x 15 - 20 years.  Later on macitentan.  I had her do a TEE in 6/16.  She had a loud MR murmur.  TEE showed very eccentric, posteriorly-directed MR that may be from subtle prolapse of the anterior mitral leaflet.  I am concerned that the MR could be severe but very difficult to fully visualize.  She additionally had PFTs done that were abnormal, but according to her pulmonologist Melvyn Novas), the obstruction was really only minimal and the restriction was primarily related to body habitus.  He does not think that we can explain her hypoxemia and dyspnea primarily from parenchymal lung pathology.  I sent her to [facility] for evaluation for percutaneous mitral valve clip.  They did not think that she was a good candidate.   She has been anemic and received 1 unit PRBCs in 8/16.  She saw GI and was noted to be heme negative, no scopes were done (heme negative and high risk). She has had iron infusions.   Opsumit was stopped in 9/16 due to suspicion for group 2 pulmonary hypertension only (low PVR) and she felt better off it.   She was admitted in 1/17 with increased dyspnea and was found to be profoundly anemic.  She had a total of 5 units PRBCs and an iron infusion in the hospital.  Stool was FOBT+, melena.  EGD, colonoscopy, and capsule endoscopy were all done without clear source of blood loss.  While in the hospital, she was noted to go into atrial  fibrillation with RVR.  She initially required amiodarone for rate control, but was transitioned over to Toprol XL. She was not anticoagulated due to concern for GI blood loss and anemia.  Anticoagulation was attempted later.   RHC in 3/17 showed elevated right heart filling pressures and mildly elevated PCWP.  There was mild pulmonary hypertension, likely pulmonary venous hypertension with PVR 1.9 WU.   She was admitted in 7/17 with symptomatic anemia, received 3 units PRBCs.  Anticoagulation was stopped.  Small bowel enteroscopy did not show a source of bleeding.    She was seen by Dr Rayann Heman, he recommended against Watchman. She is now off anticoagulation.  Has had Fe infusions and getting Aranesp.   Admitted 12/7 through 07/23/16. Treated for UTI, sepsis and volume overload. Discharged to [facility] then later home.   Recently seen with increased dyspnea.  Repeat echo showed severe MR so TEE was done.  TEE in 4/19 showed severe MR with partial flail A2 scallop and moderate-severe TR.   She returns for follow up of CHF and atrial fibrillation. She remains in chronic atrial fibrillation.  She saw Dr. Burt Knack last week and was noted to be volume overloaded.  She took metolazone on Thursday and Sunday.  She says that she feels better but is still "swollen."  She is short of breath walking 50 feet but does ok around the house.  She sleeps on 2 pillows chronically.  No lightheadedness or chest pain.   Labs 02/05/13 K 3.8 Creatinine 1.37 05/08/13 K 3.8, Creatinine 1.58 11/14 K 4.1, creatinine 1.82 08/09/2014: K 4.0 Creatinine 1.45  5/16: K 3.7, creatinine 1.76, BNP 447 6/16: K 5 => 4.6, creatinine 2.09 => 2.13, BNP 291 8/16: K 4.7, creatinine 2.1, HCT 30.7 9/16: K 4.5, creatinine 2.3, HCT 34.1 1/17: K 4.8, creatinine 1.84 => 1.87, HCT 29.7, plts 136, FOBT+, BNP 403 2/17: K 3.5, creatinine 1.89, BNP 329, hgb 10 3/17: K 3.8, creatinine 2.5 => 2.3, HCT 35.4 => 33.5 5/17: K 3.7, creatinine 2.0, hgb  10.6 7/17: K 3.7, creatinine 2, hgb 9.9 8/17: K 3.8, creatinine 1.9, HCT 31.3 9/17: K 3.9, creatinine 2 07/23/2016: K 4.3 Creatinine 2.76  08/02/2016: K 4.7 Creatinine 2.0  4/18: K 4, creatinine 2.3, hgb 10.7 8/18: K 4, creatinine 2.13, hgb 10.4, plts 83 9/18: LDL 59 11/18: K 4, creatinine 2.2 12/18: hgb 10.7 4/19: K 4.2, creatinine 2.6  1) COPD: Former smoker (28 pk-yrs).  On home O2 - 4L/min rest, up to 5L/min exertion.  - spirometry 6/13 in [place] = 0.85L FVC = 1.3L no DLCO measured. FEV1 in 2014 = 0.73  - CT chest 10/14 - moderate centrilobular emphysema. No pulm fibrosis but there was evidence for post-infectious/inflammatory scarring. Stable Asc Ao repair - PFTs (11/14) FVC 46%, FEV1 0.8L (40%), ratio 87%, FEF 25-75% 0.44L DLCO 22%, TLC 50% => PFTs in 06/2013 did NOT suggest significant airflow obstruction according to Dr Gustavus Bryant last note despite "moderate emphysema" on CT.  - PFTs (6/16) were abnormal with severe obstruction, severe restriction, severely decreased DLCO.  Reviewed with Dr Melvyn Novas => he thinks obstruction is actually fairly minimal and that the restriction is due to body habitus.   2) Obesity 3) RA: On plaquenil, sees Dr Lenna Gilford.  4) Type A aortic dissection s/p repair 2005: c/b renal failure requiring HD x 3 months 5) PAH - previously treated in [place].  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  - V/Q scan 11/14 no chronic PE - 2015 Tracleer stopped and started on macitentan. 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  - RHC 08/18/2014: No role for pulmonary vasodilators.  RA = 14 RV = 62/3/15 PA = 63/21 (38) PCW = mean 23 v waves to 40 Fick cardiac output/index = 6.3/3.2 Thermodilution CO/CI = 6.1/3.1, PVR = 2.0 WU, FA sat = 91% PA sat = 57%, 62% - RHC 3/17: mean RA 13, PA 44/2 mean 29, mean PCWP 17, CI 3.04, PVR 1.9 WU => pulmonary venous hypertension. 6) OSA 7) Diastolic CHF - ECHO 1/01 EF 55-60% RV mildly dilated with normal function. Mild to  moderate posterior MR. RVSP 56. Probable restrictive diastolic filling pattern - ECHO 9/15 EF 60-65% RV mildly dilated with normal function. Mild to moderate posterior MR. RVSP 40. Ao Root ok - TEE 6/16 with EF 55-60%, D-shaped interventricular septum suggestive of RV pressure/volume overload, RV mildly dilated with normal systolic function, peak RV-RA gradient 57 mmHg, very eccentric posteriorly-directed mitral regurgitation, possibly severe, etiology may be a very small area of prolapse on the anterior leaflet, s/p ascending aorta repair with residual dissection flap in the descending thoracic aorta.   - Echo (1/17) with EF 55-60%, mild to moderate MR, PASP 66 - Echo (12/17) with EF 55-60%, grade II diastolic dysfunction, mild MR, mildly decreased RV systolic function, PASP 66 mmHg.  8) CKD 9) Mitral regurgitation: Possibly severe by 6/16  TEE, very eccentric.  Not good candidate for percutaneous MV clip (seen at [facility]).  Echo (1/17) with only mild to moderate MR.  - TEE (4/19): EF 55-60%, severe MR with partial flail A2 scallop, mildly decreased RV systolic function, moderate-severe TR, peak RV-RA gradient 43 mmHg.  10) Anemia: Suspect from chronic disease/renal disease and well as chronic GI bleeding.  EGD/colonoscopy/capsule endoscopy in 1/17 did not show any definite site for blood loss.  Small bowel enteroscopy in 7/17 did not show etiology of bleeding. 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  Getting Fe infusions.  11) Atrial fibrillation: Persistent, 1st noted in 1/17.  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   ROS: All systems reviewed and negative except as per HPI.  Current Outpatient Medications on File Prior to Encounter  Medication Sig Dispense Refill  . acetaminophen (TYLENOL) 500 MG tablet Take 1,000 mg by mouth every 6 (six) hours as needed for moderate pain or headache.    . allopurinol (ZYLOPRIM) 300 MG tablet Take 300 mg by mouth daily.    [name] ALPRAZolam  (XANAX) 0.25 MG tablet Take 1 tablet (0.25 mg total) by mouth every 12 (twelve) hours as needed for anxiety or sleep. (Patient taking differently: Take 0.25 mg by mouth at bedtime. ) 60 tablet 5  . atorvastatin (LIPITOR) 10 MG tablet Take 1 tablet (10 mg total) by mouth daily. 90 tablet 2  . Biotin 5000 MCG CAPS Take 5,000 mcg by mouth daily.     . hydroxychloroquine (PLAQUENIL) 200 MG tablet Take 200 mg by mouth 2 (two) times daily.    . metoprolol succinate (TOPROL-XL) 25 MG 24 hr tablet Take 1 tablet (25 mg total) by mouth 2 (two) times daily. 180 tablet 3  . Omega-3 Fatty Acids (FISH OIL) 1200 MG CAPS Take 1,200 mg by mouth daily.    . OXYGEN Inhale 3 L into the lungs continuous.     . Polyethylene Glycol 400 (BLINK TEARS OP) Place 1-2 drops into both eyes at bedtime.    . Tiotropium Bromide-Olodaterol (STIOLTO RESPIMAT) 2.5-2.5 MCG/ACT AERS Inhale 2 puffs into the lungs daily. 2 Inhaler 0  . torsemide (DEMADEX) 20 MG tablet Take 4 tablets (80 mg total) by mouth every morning AND 3 tablets (60 mg total) every evening. 210 tablet 3   No current facility-administered medications on file prior to encounter.    Social History   Socioeconomic History  . Marital status: Legally Separated    Spouse name: Not on file  . Number of children: 1  . Years of education: Not on file  . Highest education level: Not on file  Occupational History  . Occupation: Disability    Comment: Office Work  Scientific laboratory technician  . Financial resource strain: Not on file  . Food insecurity:    Worry: Not on file    Inability: Not on file  . Transportation needs:    Medical: Not on file    Non-medical: Not on file  Tobacco Use  . Smoking status: Former Smoker    Packs/day: 1.50    Years: 14.00    Pack years: 21.00    Types: Cigarettes    Last attempt to quit: 08/09/2003    Years since quitting: 14.4  . Smokeless tobacco: Never Used  Substance and Sexual Activity  . Alcohol use: Yes    Alcohol/week: 0.0 oz     Comment: 02/20/2016 "drank a little in my teens"  . Drug use: No  . Sexual activity: Never  Lifestyle  .  Physical activity:    Days per week: Not on file    Minutes per session: Not on file  . Stress: Not on file  Relationships  . Social connections:    Talks on phone: Not on file    Gets together: Not on file    Attends religious service: Not on file    Active member of club or organization: Not on file    Attends meetings of clubs or organizations: Not on file    Relationship status: Not on file  Other Topics Concern  . Not on file  Social History Narrative  . Not on file   Family History  Problem Relation Age of Onset  . Heart attack Mother   . Prostate cancer Father   . Diabetes Brother   . Kidney failure Brother   . Breast cancer Maternal Aunt   . Colon cancer Neg Hx   . Stomach cancer Neg Hx     Vitals:   01/07/18 1446  BP: (!) 94/52  Pulse: 60  SpO2: 91%  Weight: 184 lb 4 oz (83.6 kg)   General: NAD Neck: JVP 14+ cm, no thyromegaly or thyroid nodule.  Lungs: Clear to auscultation bilaterally with normal respiratory effort. CV: Nondisplaced PMI.  Heart regular S1/S2, no S3/S4, 3/6 HSM apex.  1+ edema to knees bilaterally.  No carotid bruit.  Normal pedal pulses.  Abdomen: Soft, nontender, no hepatosplenomegaly, no distention.  Skin: Intact without lesions or rashes.  Neurologic: Alert and oriented x 3.  Psych: Normal affect. Extremities: No clubbing or cyanosis.  HEENT: Normal.   Assessment/Plan: 1. Chronic diastolic CHF with pulmonary hypertension/RV failure: NYHA IIIb. She is volume overloaded in setting of severe MR and renal dysfunction.   - Continue torsemide 80 qam/60 qpm and take metolazone twice weekly for now on Wednesday and Saturday.  She will take an extra KCl 20 on metolazone days.  BMET today and at followup on 6/14.  - I will have her wear graded compression stockings during the day.  2. Chronic respiratory failure:  She is on 3 liters at  rest, 4-5 L with exertion home oxygen by [place]. She has COPD and restriction from her body habitus (OHS).  3. OSA: Continue CPAP nightly.  4. CKD: Stage III-IV. Creatinine baseline 2-2.3. This has been rising.  Will need to follow closely with diuresis.  5. Pulmonary hypertension: This is primarily [place] group 2 (pulmonary venous hypertension) based on RHC in 3/17.  She is not a good candidate for pulmonary vasodilators.   6. Mitral regurgitation:  Severe MR on 4/19 TEE due to partial flail of the anterior leaflet (A2 scallop).   - Seen by Dr. Burt Knack, planned for Mitraclip later this month. I think that this could help her significantly.   7. Anemia: Suspect anemia of chronic disease/renal disease but also with component of GI blood loss.  She has had GI bleeding with FOBT+, but unable to visualize bleeding source on EGD, colonoscopy, enteroscopy, or capsule endoscopy (most recently in 7/17).  Seeing hematology (Dr. Irene Limbo), getting Aranesp and Fe infusions with Fe deficiency and possible MDS.  8. Atrial fibrillation: Persistent.  Rate is controlled.  CHADSVASC = 3.  She was evaluated for Watchman but decided not to be a good candidate. Due to recurrent severe GI bleeding, anticoagulation was stopped altogether. No stroke-like symptoms.  9. 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   Followup with me on 6/14.   [name] MD 01/09/2018

## 2018-01-11 ENCOUNTER — Ambulatory Visit (HOSPITAL_BASED_OUTPATIENT_CLINIC_OR_DEPARTMENT_OTHER): Payer: Medicare Other | Attending: Emergency Medicine | Admitting: Pulmonary Disease

## 2018-01-11 DIAGNOSIS — J449 Chronic obstructive pulmonary disease, unspecified: Secondary | ICD-10-CM | POA: Diagnosis not present

## 2018-01-11 DIAGNOSIS — G4733 Obstructive sleep apnea (adult) (pediatric): Secondary | ICD-10-CM

## 2018-01-11 DIAGNOSIS — I4891 Unspecified atrial fibrillation: Secondary | ICD-10-CM | POA: Insufficient documentation

## 2018-01-11 DIAGNOSIS — Z9989 Dependence on other enabling machines and devices: Secondary | ICD-10-CM

## 2018-01-11 DIAGNOSIS — M069 Rheumatoid arthritis, unspecified: Secondary | ICD-10-CM | POA: Diagnosis not present

## 2018-01-11 DIAGNOSIS — I272 Pulmonary hypertension, unspecified: Secondary | ICD-10-CM | POA: Insufficient documentation

## 2018-01-11 DIAGNOSIS — I509 Heart failure, unspecified: Secondary | ICD-10-CM | POA: Insufficient documentation

## 2018-01-13 ENCOUNTER — Ambulatory Visit (HOSPITAL_COMMUNITY)
Admission: RE | Admit: 2018-01-13 | Discharge: 2018-01-13 | Disposition: A | Discharge status: Home / Self Care | Payer: Medicare Other | Source: Ambulatory Visit | Attending: Cardiology | Admitting: Cardiology

## 2018-01-13 ENCOUNTER — Other Ambulatory Visit: Payer: Self-pay

## 2018-01-13 DIAGNOSIS — I5032 Chronic diastolic (congestive) heart failure: Secondary | ICD-10-CM | POA: Insufficient documentation

## 2018-01-13 LAB — BASIC METABOLIC PANEL
ANION GAP: 11 (ref 5–15)
BUN: 56 mg/dL — AB (ref 6–20)
CO2: 36 mmol/L — AB (ref 22–32)
Calcium: 9.9 mg/dL (ref 8.9–10.3)
Chloride: 97 mmol/L — ABNORMAL LOW (ref 101–111)
Creatinine, Ser: 3.69 mg/dL — ABNORMAL HIGH (ref 0.44–1.00)
GFR calc Af Amer: 13 mL/min — ABNORMAL LOW (ref >60)
GFR calc non Af Amer: 11 mL/min — ABNORMAL LOW (ref >60)
GLUCOSE: 116 mg/dL — AB (ref 65–99)
POTASSIUM: 3.5 mmol/L (ref 3.5–5.1)
Sodium: 144 mmol/L (ref 135–145)

## 2018-01-17 ENCOUNTER — Ambulatory Visit (HOSPITAL_COMMUNITY)
Admission: RE | Admit: 2018-01-17 | Discharge: 2018-01-17 | Disposition: A | Discharge status: Home / Self Care | Payer: Medicare Other | Source: Ambulatory Visit | Attending: Cardiology | Admitting: Cardiology

## 2018-01-17 VITALS — BP 98/60 | HR 60 | Wt 174.2 lb

## 2018-01-17 DIAGNOSIS — D696 Thrombocytopenia, unspecified: Secondary | ICD-10-CM | POA: Diagnosis not present

## 2018-01-17 DIAGNOSIS — I340 Nonrheumatic mitral (valve) insufficiency: Secondary | ICD-10-CM | POA: Insufficient documentation

## 2018-01-17 DIAGNOSIS — Z9981 Dependence on supplemental oxygen: Secondary | ICD-10-CM | POA: Diagnosis not present

## 2018-01-17 DIAGNOSIS — G4733 Obstructive sleep apnea (adult) (pediatric): Secondary | ICD-10-CM | POA: Insufficient documentation

## 2018-01-17 DIAGNOSIS — Z79899 Other long term (current) drug therapy: Secondary | ICD-10-CM | POA: Diagnosis not present

## 2018-01-17 DIAGNOSIS — J449 Chronic obstructive pulmonary disease, unspecified: Secondary | ICD-10-CM | POA: Diagnosis not present

## 2018-01-17 DIAGNOSIS — N184 Chronic kidney disease, stage 4 (severe): Secondary | ICD-10-CM | POA: Diagnosis not present

## 2018-01-17 DIAGNOSIS — I5032 Chronic diastolic (congestive) heart failure: Secondary | ICD-10-CM | POA: Insufficient documentation

## 2018-01-17 DIAGNOSIS — J9610 Chronic respiratory failure, unspecified whether with hypoxia or hypercapnia: Secondary | ICD-10-CM | POA: Insufficient documentation

## 2018-01-17 DIAGNOSIS — M069 Rheumatoid arthritis, unspecified: Secondary | ICD-10-CM | POA: Diagnosis not present

## 2018-01-17 DIAGNOSIS — D649 Anemia, unspecified: Secondary | ICD-10-CM | POA: Insufficient documentation

## 2018-01-17 DIAGNOSIS — Z87891 Personal history of nicotine dependence: Secondary | ICD-10-CM | POA: Insufficient documentation

## 2018-01-17 DIAGNOSIS — I272 Pulmonary hypertension, unspecified: Secondary | ICD-10-CM | POA: Diagnosis not present

## 2018-01-17 DIAGNOSIS — I130 Hypertensive heart and chronic kidney disease with heart failure and stage 1 through stage 4 chronic kidney disease, or unspecified chronic kidney disease: Secondary | ICD-10-CM | POA: Insufficient documentation

## 2018-01-17 DIAGNOSIS — I482 Chronic atrial fibrillation, unspecified: Secondary | ICD-10-CM

## 2018-01-17 MED ORDER — METOLAZONE 2.5 MG PO TABS: 2.5000 mg | ORAL_TABLET | ORAL | 3 refills | Status: DC

## 2018-01-17 MED ORDER — TORSEMIDE 20 MG PO TABS: ORAL_TABLET | ORAL | 3 refills | Status: DC

## 2018-01-17 NOTE — Patient Instructions (Signed)
Hold 2 Doses of Metolazone, don't take again until Saturday June 22.  Then only take once weekly on Saturdays only.  Continue Torsemide 80 mg (4 Tabs) in the AM and 60 mg (3 Tabs) in the PM.  Labs on Monday June 17th (bmet)  Follow up with Dr. Aundra Dubin in 1 month.

## 2018-01-17 NOTE — Progress Notes (Signed)
Patient ID: April Mueller, female   DOB: May 31, 1946, 72 y.o.   MRN: [id number]   HPI  PCP: April Mueller Pulmonary: April Mueller/April Mueller  Cardiology: April. Aundra Mueller  April Mueller is a 72 y.o. with COPD on home oxygen, rheumatoid arthritis, chronic atrial fibrillation, chronic diastolic CHF, CKD.    Moved back from [place] and saw April. Lamonte Mueller. In [place] was followed by cardiology and pulmonary. Started on Tracleer in 2006. Revatio was added, however was discontinued without any change in her symptoms. Stopped smoking in January 2005 when she had aortic aneurysm repair. Smoked 1.5-2 ppd x 15 - 20 years.  Later on macitentan.  I had her do a TEE in 6/16.  She had a loud MR murmur.  TEE showed very eccentric, posteriorly-directed MR that may be from subtle prolapse of the anterior mitral leaflet.  I am concerned that the MR could be severe but very difficult to fully visualize.  She additionally had PFTs done that were abnormal, but according to her pulmonologist April Mueller), the obstruction was really only minimal and the restriction was primarily related to body habitus.  He does not think that we can explain her hypoxemia and dyspnea primarily from parenchymal lung pathology.  I sent her to [facility] for evaluation for percutaneous mitral valve clip.  They did not think that she was a good candidate.   She has been anemic and received 1 unit PRBCs in 8/16.  She saw GI and was noted to be heme negative, no scopes were done (heme negative and high risk). She has had iron infusions.   Opsumit was stopped in 9/16 due to suspicion for group 2 pulmonary hypertension only (low PVR) and she felt better off it.   She was admitted in 1/17 with increased dyspnea and was found to be profoundly anemic.  She had a total of 5 units PRBCs and an iron infusion in the hospital.  Stool was FOBT+, melena.  EGD, colonoscopy, and capsule endoscopy were all done without clear source of blood loss.  While in the hospital, she was noted to go into atrial  fibrillation with RVR.  She initially required amiodarone for rate control, but was transitioned over to Toprol XL. She was not anticoagulated due to concern for GI blood loss and anemia.  Anticoagulation was attempted later.   RHC in 3/17 showed elevated right heart filling pressures and mildly elevated PCWP.  There was mild pulmonary hypertension, likely pulmonary venous hypertension with PVR 1.9 WU.   She was admitted in 7/17 with symptomatic anemia, received 3 units PRBCs.  Anticoagulation was stopped.  Small bowel enteroscopy did not show a source of bleeding.    She was seen by April Mueller, he recommended against Watchman. She is now off anticoagulation.  Has had Fe infusions and getting Aranesp.   Admitted 12/7 through 07/23/16. Treated for UTI, sepsis and volume overload. Discharged to [facility] SNF then later home.   Recently seen with increased dyspnea.  Repeat echo showed severe MR so TEE was done.  TEE in 4/19 showed severe MR with partial flail A2 scallop and moderate-severe TR.   She returns for follow up of CHF and atrial fibrillation. She remains in chronic atrial fibrillation.  We increased her metolazone recently and she has lost 10 lbs.  She feels better, walked in today (not using wheelchair).  She thinks she is back to baseline.  Now able to walk about 100 feet before she has to stop.  Continues on home oxygen.  No lightheadedness.  No chest pain.  Does not feel palpitations.  She is back to sleeping on 2 pillows which is her baseline.  Creatinine is higher with diuresis, I had her hold torsemide yesterday.   Labs 02/05/13 K 3.8 Creatinine 1.37 05/08/13 K 3.8, Creatinine 1.58 11/14 K 4.1, creatinine 1.82 08/09/2014: K 4.0 Creatinine 1.45  5/16: K 3.7, creatinine 1.76, BNP 447 6/16: K 5 => 4.6, creatinine 2.09 => 2.13, BNP 291 8/16: K 4.7, creatinine 2.1, HCT 30.7 9/16: K 4.5, creatinine 2.3, HCT 34.1 1/17: K 4.8, creatinine 1.84 => 1.87, HCT 29.7, plts 136, FOBT+, BNP 403 2/17:  K 3.5, creatinine 1.89, BNP 329, hgb 10 3/17: K 3.8, creatinine 2.5 => 2.3, HCT 35.4 => 33.5 5/17: K 3.7, creatinine 2.0, hgb 10.6 7/17: K 3.7, creatinine 2, hgb 9.9 8/17: K 3.8, creatinine 1.9, HCT 31.3 9/17: K 3.9, creatinine 2 07/23/2016: K 4.3 Creatinine 2.76  08/02/2016: K 4.7 Creatinine 2.0  4/18: K 4, creatinine 2.3, hgb 10.7 8/18: K 4, creatinine 2.13, hgb 10.4, plts 83 9/18: LDL 59 11/18: K 4, creatinine 2.2 12/18: hgb 10.7 4/19: K 4.2, creatinine 2.6 6/19: K 3.5, creatnine 3.69  1) COPD: Former smoker (28 pk-yrs).  On home O2 - 4L/min rest, up to 5L/min exertion.  - spirometry 6/13 in [place] = 0.85L FVC = 1.3L no DLCO measured. FEV1 in 2014 = 0.73  - CT chest 10/14 - moderate centrilobular emphysema. No pulm fibrosis but there was evidence for post-infectious/inflammatory scarring. Stable Asc Ao repair - PFTs (11/14) FVC 46%, FEV1 0.8L (40%), ratio 87%, FEF 25-75% 0.44L DLCO 22%, TLC 50% => PFTs in 06/2013 did NOT suggest significant airflow obstruction according to April April Mueller last note despite "moderate emphysema" on CT.  - PFTs (6/16) were abnormal with severe obstruction, severe restriction, severely decreased DLCO.  Reviewed with April Mueller => he thinks obstruction is actually fairly minimal and that the restriction is due to body habitus.   2) Obesity 3) RA: On plaquenil, sees April April Mueller.  4) Type A aortic dissection s/p repair 2005: c/b renal failure requiring HD x 3 months 5) PAH - previously treated in [place].  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  - V/Q scan 11/14 no chronic PE - 2015 Tracleer stopped and started on macitentan. 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  - RHC 08/18/2014: No role for pulmonary vasodilators.  RA = 14 RV = 62/3/15 PA = 63/21 (38) PCW = mean 23 v waves to 40 Fick cardiac output/index = 6.3/3.2 Thermodilution CO/CI = 6.1/3.1, PVR = 2.0 WU, FA sat = 91% PA sat = 57%, 62% - RHC 3/17: mean RA 13, PA 44/2 mean 29, mean PCWP  17, CI 3.04, PVR 1.9 WU => pulmonary venous hypertension. 6) OSA 7) Diastolic CHF - ECHO 1/09 EF 55-60% RV mildly dilated with normal function. Mild to moderate posterior MR. RVSP 56. Probable restrictive diastolic filling pattern - ECHO 9/15 EF 60-65% RV mildly dilated with normal function. Mild to moderate posterior MR. RVSP 40. Ao Root ok - TEE 6/16 with EF 55-60%, D-shaped interventricular septum suggestive of RV pressure/volume overload, RV mildly dilated with normal systolic function, peak RV-RA gradient 57 mmHg, very eccentric posteriorly-directed mitral regurgitation, possibly severe, etiology may be a very small area of prolapse on the anterior leaflet, s/p ascending aorta repair with residual dissection flap in the descending thoracic aorta.   - Echo (1/17) with EF 55-60%, mild to moderate MR, PASP 66 -  Echo (12/17) with EF 55-60%, grade II diastolic dysfunction, mild MR, mildly decreased RV systolic function, PASP 66 mmHg.  8) CKD 9) Mitral regurgitation: Possibly severe by 6/16 TEE, very eccentric.  Not good candidate for percutaneous MV clip (seen at [facility]).  Echo (1/17) with only mild to moderate MR.  - TEE (4/19): EF 55-60%, severe MR with partial flail A2 scallop, mildly decreased RV systolic function, moderate-severe TR, peak RV-RA gradient 43 mmHg.  10) Anemia: Suspect from chronic disease/renal disease and well as chronic GI bleeding.  EGD/colonoscopy/capsule endoscopy in 1/17 did not show any definite site for blood loss.  Small bowel enteroscopy in 7/17 did not show etiology of bleeding. 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  Getting Fe infusions.  11) Atrial fibrillation: Persistent, 1st noted in 1/17.  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   ROS: All systems reviewed and negative except as per HPI.  Current Outpatient Medications on File Prior to Encounter  Medication Sig Dispense Refill  . acetaminophen (TYLENOL) 500 MG tablet Take 1,000 mg by  mouth every 6 (six) hours as needed for moderate pain or headache.    . allopurinol (ZYLOPRIM) 300 MG tablet Take 300 mg by mouth daily.    [name] ALPRAZolam (XANAX) 0.25 MG tablet Take 1 tablet (0.25 mg total) by mouth every 12 (twelve) hours as needed for anxiety or sleep. (Patient taking differently: Take 0.25 mg by mouth at bedtime. ) 60 tablet 5  . atorvastatin (LIPITOR) 10 MG tablet Take 1 tablet (10 mg total) by mouth daily. 90 tablet 2  . Biotin 5000 MCG CAPS Take 5,000 mcg by mouth daily.     . hydroxychloroquine (PLAQUENIL) 200 MG tablet Take 200 mg by mouth 2 (two) times daily.    . metoprolol succinate (TOPROL-XL) 25 MG 24 hr tablet Take 1 tablet (25 mg total) by mouth 2 (two) times daily. 180 tablet 3  . Omega-3 Fatty Acids (FISH OIL) 1200 MG CAPS Take 1,200 mg by mouth daily.    . OXYGEN Inhale 3 L into the lungs continuous.     . Polyethylene Glycol 400 (BLINK TEARS OP) Place 1-2 drops into both eyes at bedtime.    . potassium chloride (K-DUR,KLOR-CON) 10 MEQ tablet Take 2 tablets (20 mEq total) by mouth 2 (two) times daily. Take an extra tab on Saturday and Wednesday when you take Metolazone 135 tablet 3  . Tiotropium Bromide-Olodaterol (STIOLTO RESPIMAT) 2.5-2.5 MCG/ACT AERS Inhale 2 puffs into the lungs daily. 2 Inhaler 0   No current facility-administered medications on file prior to encounter.    Social History   Socioeconomic History  . Marital status: Legally Separated    Spouse name: Not on file  . Number of children: 1  . Years of education: Not on file  . Highest education level: Not on file  Occupational History  . Occupation: Disability    Comment: Office Work  Scientific laboratory technician  . Financial resource strain: Not on file  . Food insecurity:    Worry: Not on file    Inability: Not on file  . Transportation needs:    Medical: Not on file    Non-medical: Not on file  Tobacco Use  . Smoking status: Former Smoker    Packs/day: 1.50    Years: 14.00    Pack years: 21.00     Types: Cigarettes    Last attempt to quit: 08/09/2003    Years since quitting: 14.4  . Smokeless tobacco: Never Used  Substance and Sexual Activity  .  Alcohol use: Yes    Alcohol/week: 0.0 oz    Comment: 02/20/2016 "drank a little in my teens"  . Drug use: No  . Sexual activity: Never  Lifestyle  . Physical activity:    Days per week: Not on file    Minutes per session: Not on file  . Stress: Not on file  Relationships  . Social connections:    Talks on phone: Not on file    Gets together: Not on file    Attends religious service: Not on file    Active member of club or organization: Not on file    Attends meetings of clubs or organizations: Not on file    Relationship status: Not on file  Other Topics Concern  . Not on file  Social History Narrative  . Not on file   Family History  Problem Relation Age of Onset  . Heart attack Mother   . Prostate cancer Father   . Diabetes Brother   . Kidney failure Brother   . Breast cancer Maternal Aunt   . Colon cancer Neg Hx   . Stomach cancer Neg Hx     Vitals:   01/17/18 0905  BP: 98/60  Pulse: 60  SpO2: 94%  Weight: 174 lb 4 oz (79 kg)   General: NAD Neck: JVP 8-9 cm, no thyromegaly or thyroid nodule.  Lungs: Clear to auscultation bilaterally with normal respiratory effort. CV: Nondisplaced PMI.  Heart regular S1/S2, no S3/S4, 3/6 HSM apex.  1+ edema to knees.  No carotid bruit.  Normal pedal pulses.  Abdomen: Soft, nontender, no hepatosplenomegaly, no distention.  Skin: Intact without lesions or rashes.  Neurologic: Alert and oriented x 3.  Psych: Normal affect. Extremities: No clubbing or cyanosis.  HEENT: Normal.   Assessment/Plan: 1. Chronic diastolic CHF with pulmonary hypertension/RV failure:  She has been volume overloaded in setting of severe MR and renal dysfunction.  Recently increased metolazone with 10 lb weight loss.  She feels much better, NYHA class III.  Some volume overload on exam but improved  overall. Unfortunately, creatinine now > 3.  - Continue torsemide 80 qam/60 qpm.  Held yesterday's torsemide with rise in creatinine.  Will hold the next 2 doses of metolazone and then start her back on metolazone once weekly on Saturdays (starting 6/22).  She will need to cut back on po Na intake.  - Repeat BMET Monday.  - She should wear graded compression stockings during the day.  2. Chronic respiratory failure:  She is on 3 liters at rest, 4-5 L with exertion home oxygen by [place] [facility]. She has COPD and restriction from her body habitus (OHS).  3. OSA: Continue CPAP nightly.  4. CKD: Stage III-IV. Creatinine had been baseline 2-2.3. This was up to 3.69 when last checked with slow rise.   - As above, will hold 2 doses of metolazone and cut back to once weekly.  - BMET Monday.  5. Pulmonary hypertension: This is primarily [place] group 2 (pulmonary venous hypertension) based on RHC in 3/17.  She is not a good candidate for pulmonary vasodilators.   6. Mitral regurgitation:  Severe MR on 4/19 TEE due to partial flail of the anterior leaflet (A2 scallop).   - Seen by April. Burt Knack, planned for Mitraclip later this month. I think that this could help her significantly.   7. Anemia: Suspect anemia of chronic disease/renal disease but also with component of GI blood loss.  She has had GI bleeding with FOBT+, but  unable to visualize bleeding source on EGD, colonoscopy, enteroscopy, or capsule endoscopy (most recently in 7/17).  Seeing hematology (April. Irene Limbo), getting Aranesp and Fe infusions with Fe deficiency and possible MDS.  8. Atrial fibrillation: Persistent.  Rate is controlled.  CHADSVASC = 3.  She was evaluated for Watchman but decided not to be a good candidate. Due to recurrent severe GI bleeding, anticoagulation was stopped altogether. No stroke-like symptoms.  9. 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   Followup with me in 1 month.   [name] MD 01/17/2018

## 2018-01-18 DIAGNOSIS — Z9989 Dependence on other enabling machines and devices: Secondary | ICD-10-CM

## 2018-01-18 DIAGNOSIS — G4733 Obstructive sleep apnea (adult) (pediatric): Secondary | ICD-10-CM

## 2018-01-18 NOTE — Procedures (Signed)
   Patient Name: April, Mueller Date: 01/11/2018 Gender: Female D.O.B: Mar 13, 1946 Age (years): 55 Referring Provider: Baltazar Apo Height (inches): 64 Interpreting Physician: Chesley Mires MD, ABSM Weight (lbs): 182 RPSGT: Earney Hamburg BMI: 31 MRN: [id number] Neck Size: 16.00  CLINICAL INFORMATION The patient is referred for a CPAP titration to treat sleep apnea.  She has a history of COPD, rheumatoid arthritis, atrial fibrillation, congestive heart failure and pulmonary hypertension.  SLEEP STUDY TECHNIQUE As per the AASM Manual for the Scoring of Sleep and Associated Events v2.3 (April 2016) with a hypopnea requiring 4% desaturations.  The channels recorded and monitored were frontal, central and occipital EEG, electrooculogram (EOG), submentalis EMG (chin), nasal and oral airflow, thoracic and abdominal wall motion, anterior tibialis EMG, snore microphone, electrocardiogram, and pulse oximetry. Continuous positive airway pressure (CPAP) was initiated at the beginning of the study and titrated to treat sleep-disordered breathing.  MEDICATIONS Medications self-administered by patient taken the night of the study : N/A  TECHNICIAN COMMENTS Comments added by technician: Patient had difficulty initiating sleep. Patient was restless all through the night. Comments added by scorer: N/A  RESPIRATORY PARAMETERS Optimal PAP Pressure (cm): 9 AHI at Optimal Pressure (/hr): 0.0 Overall Minimal O2 (%): 84.00 Supine % at Optimal Pressure (%): 100 Minimal O2 at Optimal Pressure (%): 87.0    The study was conducted with her using baseline 3 liters supplemental oxygen. SLEEP ARCHITECTURE The study was initiated at 10:40:54 PM and ended at 4:42:02 AM.  Sleep onset time was 52.9 minutes and the sleep efficiency was 42.6%. The total sleep time was 154 minutes.  The patient spent 4.22% of the night in stage N1 sleep, 76.30% in stage N2 sleep, 0.00% in stage N3 and 19.48% in  REM.Stage REM latency was 54.0 minutes  Wake after sleep onset was 154.2. Alpha intrusion was absent. Supine sleep was 99.35%.  CARDIAC DATA The 2 lead EKG demonstrated sinus rhythm. The mean heart rate was 49.22 beats per minute. Other EKG findings include: PVCs, Atrial Flutter.  LEG MOVEMENT DATA The total Periodic Limb Movements of Sleep (PLMS) were 0. The PLMS index was 0.00. A PLMS index of <15 is considered normal in adults.  IMPRESSIONS - She did well with CPAP 9 cm H2O. - She required 3 liters oxygen with CPAP 9 cm H2O to maintain her oxygen saturation.  DIAGNOSIS - Obstructive Sleep Apnea (327.23 [G47.33 ICD-10])  RECOMMENDATIONS - Trial of CPAP therapy on 9 cm H2O and 3 liters oxygen with a Medium Wide size Philips Respironics Full Face Mask Dreamwear mask and heated humidification.  [Electronically signed] 01/18/2018 04:20 PM  Chesley Mires MD, [place], American Board of Sleep Medicine NPI: [id number]

## 2018-01-20 ENCOUNTER — Ambulatory Visit (HOSPITAL_COMMUNITY)
Admission: RE | Admit: 2018-01-20 | Discharge: 2018-01-20 | Disposition: A | Discharge status: Home / Self Care | Payer: Medicare Other | Source: Ambulatory Visit | Attending: Cardiology | Admitting: Cardiology

## 2018-01-20 DIAGNOSIS — I5032 Chronic diastolic (congestive) heart failure: Secondary | ICD-10-CM

## 2018-01-20 LAB — BASIC METABOLIC PANEL
Anion gap: 10 (ref 5–15)
BUN: 79 mg/dL — AB (ref 6–20)
CO2: 32 mmol/L (ref 22–32)
CREATININE: 4.13 mg/dL — AB (ref 0.44–1.00)
Calcium: 9.6 mg/dL (ref 8.9–10.3)
Chloride: 99 mmol/L — ABNORMAL LOW (ref 101–111)
GFR calc Af Amer: 12 mL/min — ABNORMAL LOW (ref >60)
GFR, EST NON AFRICAN AMERICAN: 10 mL/min — AB (ref >60)
GLUCOSE: 102 mg/dL — AB (ref 65–99)
Potassium: 3.5 mmol/L (ref 3.5–5.1)
Sodium: 141 mmol/L (ref 135–145)

## 2018-01-22 ENCOUNTER — Other Ambulatory Visit (HOSPITAL_COMMUNITY): Payer: Self-pay

## 2018-01-22 DIAGNOSIS — I5032 Chronic diastolic (congestive) heart failure: Secondary | ICD-10-CM

## 2018-01-22 MED ORDER — TORSEMIDE 20 MG PO TABS: 80.0000 mg | ORAL_TABLET | Freq: Every day | ORAL | 3 refills | Status: DC

## 2018-01-22 NOTE — Telephone Encounter (Signed)
Notes recorded by Shirley Muscat, RN on 01/22/2018 at 10:57 AM EDT Lab appt 6/21 ------  Notes recorded by Shirley Muscat, RN on 01/21/2018 at 11:10 AM EDT Pt aware of results, agreeable to med changes (changes made in [facility]), also will hold metolazone, Pt will call back to make lab appt, ------  Notes recorded by Larey Dresser, MD on 01/20/2018 at 9:36 PM EDT Creatinine still higher. Hold torsemide for 3 days and decrease to 80 mg daily after that. BMET on Friday.

## 2018-01-23 ENCOUNTER — Other Ambulatory Visit: Payer: Self-pay

## 2018-01-23 DIAGNOSIS — I340 Nonrheumatic mitral (valve) insufficiency: Secondary | ICD-10-CM

## 2018-01-24 ENCOUNTER — Ambulatory Visit (HOSPITAL_COMMUNITY)
Admission: RE | Admit: 2018-01-24 | Discharge: 2018-01-24 | Disposition: A | Discharge status: Home / Self Care | Payer: Medicare Other | Source: Ambulatory Visit | Attending: Cardiology | Admitting: Cardiology

## 2018-01-24 DIAGNOSIS — I5032 Chronic diastolic (congestive) heart failure: Secondary | ICD-10-CM | POA: Diagnosis not present

## 2018-01-24 LAB — BASIC METABOLIC PANEL
ANION GAP: 11 (ref 5–15)
BUN: 75 mg/dL — AB (ref 6–20)
CALCIUM: 10 mg/dL (ref 8.9–10.3)
CO2: 30 mmol/L (ref 22–32)
Chloride: 102 mmol/L (ref 101–111)
Creatinine, Ser: 3.66 mg/dL — ABNORMAL HIGH (ref 0.44–1.00)
GFR calc Af Amer: 13 mL/min — ABNORMAL LOW (ref >60)
GFR, EST NON AFRICAN AMERICAN: 12 mL/min — AB (ref >60)
GLUCOSE: 106 mg/dL — AB (ref 65–99)
Potassium: 4 mmol/L (ref 3.5–5.1)
SODIUM: 143 mmol/L (ref 135–145)

## 2018-01-24 NOTE — Pre-Procedure Instructions (Signed)
YISEL MEGILL  01/24/2018      [facility] - [place], [place] AT [place] [address] [place] [address] Phone: [phone] Fax: [place], [place] [address] Phone: [phone] Fax: [phone]    Your procedure is scheduled on Wed., January 29, 2018 from 8:30AM-12:30AM  Report to [facility] Admitting Entrance "A" at 6:30AM  Call this number if you have problems the morning of surgery:  [phone]   Remember:  Do not eat or drink after midnight on June 25th   Take these medicines the morning of surgery with A SIP OF WATER By 5:30AM: Allopurinol (ZYLOPRIM), Hydroxychloroquine (PLAQUENIL), and Metoprolol succinate (TOPROL-XL). If needed Acetaminophen (TYLENOL)  As of today,  stop taking all Other Aspirin Products, Vitamins, Fish oils, and Herbal medications. Also stop all NSAIDS i.e. Advil, Ibuprofen, Motrin, Aleve, Anaprox, Naproxen, BC, Goody Powders, and all Supplements.    Do not wear jewelry, make-up or nail polish.  Do not wear lotions, powders, or perfumes, or deodorant.  Do not shave 48 hours prior to surgery.    Do not bring valuables to the hospital.  [facility] is not responsible for any belongings or valuables.  Contacts, dentures or bridgework may not be worn into surgery.  Leave your suitcase in the car.  After surgery it may be brought to your room.  For patients admitted to the hospital, discharge time will be determined by your treatment team.  Patients discharged the day of surgery will not be allowed to drive home.   Special instructions:  [facility]- Preparing For Surgery  Before surgery, you can play an important role. Because skin is not sterile, your skin needs to be as free of germs as possible. You can reduce the number of germs on your skin by washing with CHG  (chlorahexidine gluconate) Soap before surgery.  CHG is an antiseptic cleaner which kills germs and bonds with the skin to continue killing germs even after washing.    Oral Hygiene is also important to reduce your risk of infection.  Remember - BRUSH YOUR TEETH THE MORNING OF SURGERY WITH YOUR REGULAR TOOTHPASTE  Please do not use if you have an allergy to CHG or antibacterial soaps. If your skin becomes reddened/irritated stop using the CHG.  Do not shave (including legs and underarms) for at least 48 hours prior to first CHG shower. It is OK to shave your face.  Please follow these instructions carefully.   1. Shower the NIGHT BEFORE SURGERY and the MORNING OF SURGERY with CHG.   2. If you chose to wash your hair, wash your hair first as usual with your normal shampoo.  3. After you shampoo, rinse your hair and body thoroughly to remove the shampoo.  4. Use CHG as you would any other liquid soap. You can apply CHG directly to the skin and wash gently with a scrungie or a clean washcloth.   5. Apply the CHG Soap to your body ONLY FROM THE NECK DOWN.  Do not use on open wounds or open sores. Avoid contact with your eyes, ears, mouth and genitals (private parts). Wash Face and genitals (private parts)  with your normal soap.  6. Wash thoroughly, paying special attention to the area where your surgery will be performed.  7. Thoroughly rinse your body with warm  water from the neck down.  8. DO NOT shower/wash with your normal soap after using and rinsing off the CHG Soap.  9. Pat yourself dry with a CLEAN TOWEL.  10. Wear CLEAN PAJAMAS to bed the night before surgery, wear comfortable clothes the morning of surgery  11. Place CLEAN SHEETS on your bed the night of your first shower and DO NOT SLEEP WITH PETS.  Day of Surgery:  Do not apply any deodorants/lotions.  Please wear clean clothes to the hospital/surgery center.   Remember to brush your teeth WITH YOUR REGULAR  TOOTHPASTE.  Please read over the following fact sheets that you were given. Pain Booklet, Coughing and Deep Breathing, Blood Transfusion Information, MRSA Information and Surgical Site Infection Prevention

## 2018-01-27 ENCOUNTER — Ambulatory Visit: Payer: Medicare Other | Attending: Cardiovascular Disease | Admitting: Physical Therapy

## 2018-01-27 ENCOUNTER — Encounter: Payer: Self-pay | Admitting: Physical Therapy

## 2018-01-27 ENCOUNTER — Encounter (HOSPITAL_COMMUNITY)
Admission: RE | Admit: 2018-01-27 | Discharge: 2018-01-27 | Disposition: A | Discharge status: Home / Self Care | Payer: Medicare Other | Source: Ambulatory Visit | Attending: Cardiovascular Disease | Admitting: Cardiovascular Disease

## 2018-01-27 ENCOUNTER — Other Ambulatory Visit: Payer: Self-pay

## 2018-01-27 ENCOUNTER — Encounter (HOSPITAL_COMMUNITY): Payer: Self-pay

## 2018-01-27 ENCOUNTER — Ambulatory Visit (HOSPITAL_COMMUNITY)
Admission: RE | Admit: 2018-01-27 | Discharge: 2018-01-27 | Disposition: A | Discharge status: Home / Self Care | Payer: Medicare Other | Source: Ambulatory Visit | Attending: Cardiovascular Disease | Admitting: Cardiovascular Disease

## 2018-01-27 DIAGNOSIS — I517 Cardiomegaly: Secondary | ICD-10-CM

## 2018-01-27 DIAGNOSIS — I340 Nonrheumatic mitral (valve) insufficiency: Secondary | ICD-10-CM | POA: Insufficient documentation

## 2018-01-27 DIAGNOSIS — R9431 Abnormal electrocardiogram [ECG] [EKG]: Secondary | ICD-10-CM | POA: Insufficient documentation

## 2018-01-27 DIAGNOSIS — R2689 Other abnormalities of gait and mobility: Secondary | ICD-10-CM | POA: Diagnosis present

## 2018-01-27 LAB — BLOOD GAS, ARTERIAL
ACID-BASE EXCESS: 7.5 mmol/L — AB (ref 0.0–2.0)
Bicarbonate: 30.7 mmol/L — ABNORMAL HIGH (ref 20.0–28.0)
Drawn by: 449841
O2 CONTENT: 6 L/min
O2 SAT: 96.6 %
PCO2 ART: 37.3 mmHg (ref 32.0–48.0)
PH ART: 7.526 — AB (ref 7.350–7.450)
PO2 ART: 77.9 mmHg — AB (ref 83.0–108.0)
Patient temperature: 98.6

## 2018-01-27 LAB — COMPREHENSIVE METABOLIC PANEL
ALT: 22 U/L (ref 14–54)
AST: 34 U/L (ref 15–41)
Albumin: 3.4 g/dL — ABNORMAL LOW (ref 3.5–5.0)
Alkaline Phosphatase: 101 U/L (ref 38–126)
Anion gap: 14 (ref 5–15)
BUN: 58 mg/dL — AB (ref 6–20)
CHLORIDE: 101 mmol/L (ref 101–111)
CO2: 26 mmol/L (ref 22–32)
CREATININE: 3.24 mg/dL — AB (ref 0.44–1.00)
Calcium: 10.1 mg/dL (ref 8.9–10.3)
GFR, EST AFRICAN AMERICAN: 16 mL/min — AB (ref >60)
GFR, EST NON AFRICAN AMERICAN: 13 mL/min — AB (ref >60)
Glucose, Bld: 114 mg/dL — ABNORMAL HIGH (ref 65–99)
Potassium: 3.7 mmol/L (ref 3.5–5.1)
Sodium: 141 mmol/L (ref 135–145)
Total Bilirubin: 1.4 mg/dL — ABNORMAL HIGH (ref 0.3–1.2)
Total Protein: 7.8 g/dL (ref 6.5–8.1)

## 2018-01-27 LAB — CBC
HEMATOCRIT: 32.8 % — AB (ref 36.0–46.0)
Hemoglobin: 10 g/dL — ABNORMAL LOW (ref 12.0–15.0)
MCH: 29.6 pg (ref 26.0–34.0)
MCHC: 30.5 g/dL (ref 30.0–36.0)
MCV: 97 fL (ref 78.0–100.0)
PLATELETS: 48 10*3/uL — AB (ref 150–400)
RBC: 3.38 MIL/uL — AB (ref 3.87–5.11)
RDW: 15.2 % (ref 11.5–15.5)
WBC: 7.1 10*3/uL (ref 4.0–10.5)

## 2018-01-27 LAB — SURGICAL PCR SCREEN
MRSA, PCR: NEGATIVE
Staphylococcus aureus: POSITIVE — AB

## 2018-01-27 LAB — URINALYSIS, ROUTINE W REFLEX MICROSCOPIC
BILIRUBIN URINE: NEGATIVE
GLUCOSE, UA: NEGATIVE mg/dL
HGB URINE DIPSTICK: NEGATIVE
KETONES UR: NEGATIVE mg/dL
NITRITE: NEGATIVE
PROTEIN: NEGATIVE mg/dL
Specific Gravity, Urine: 1.008 (ref 1.005–1.030)
pH: 8 (ref 5.0–8.0)

## 2018-01-27 LAB — PROTIME-INR
INR: 1.32
Prothrombin Time: 16.3 seconds — ABNORMAL HIGH (ref 11.4–15.2)

## 2018-01-27 LAB — BRAIN NATRIURETIC PEPTIDE: B Natriuretic Peptide: 732.8 pg/mL — ABNORMAL HIGH (ref 0.0–100.0)

## 2018-01-27 LAB — HEMOGLOBIN A1C
HEMOGLOBIN A1C: 5.8 % — AB (ref 4.8–5.6)
MEAN PLASMA GLUCOSE: 119.76 mg/dL

## 2018-01-27 LAB — APTT: APTT: 33 s (ref 24–36)

## 2018-01-27 NOTE — Progress Notes (Addendum)
PCP - Dr. Lona Kettle Cardiologist - Dr. [name] Pulmonologist-Dr. Byrum  Chest x-ray - 01/27/18 EKG - 01/27/18 Stress Test - 10+ years ago ECHO - 11/20/17 Cardiac Cath - 10/24/15  Sleep Study - Yes, recent visit 12/17/17 CPAP - Yes  Blood Thinner Instructions: N/A Aspirin Instructions: N/A  Anesthesia review: Yes  Patient denies shortness of breath, fever, cough and chest pain. Patient on chronic oxygen at 6L. Pt presented with +4 BLE edema on diuretics. EKG tracing done, resulted in rate controlled A. Flutter. Ebony Hail, [place] assessed patient in PAT appointment.    Patient verbalized understanding of instructions that were given to them at the PAT appointment. Patient was also instructed that they will need to review over the PAT instructions again at home before surgery.  Jacqlyn Larsen, RN

## 2018-01-27 NOTE — Therapy (Signed)
[place], [place], [address] Phone: [phone]   Fax:  [phone]  Physical Therapy Evaluation  Patient Details  Name: April Mueller MRN: [id number] Date of Birth: [birth year] Referring Provider: Talbot Grumbling Md   Encounter Date: 01/27/2018  PT End of Session - 01/27/18 1224    Visit Number  1    Number of Visits  1    Date for PT Re-Evaluation  01/27/18    PT Start Time  1115    PT Stop Time  4034    PT Time Calculation (min)  50 min    Equipment Utilized During Treatment  Gait belt;Oxygen pt brought home oxygen    Activity Tolerance  Patient limited by fatigue;Patient limited by lethargy    Behavior During Therapy  [facility] for tasks assessed/performed       Past Medical History:  Diagnosis Date  . Anemia 03/23/15   transfusion  . Anxiety   . Aortic aneurysm ([place])   . Arthritis    "qwhere" (02/20/2016)  . CHF (congestive heart failure) ([place])   . Chronic lower back pain   . Chronic thoracic aortic dissection (HCC)    S/P repair ascending thoracic aortic dissection with chronic residual dissection involving transverse aortic arch and descending thoracic aorta  . CKD (chronic kidney disease) stage 3, GFR 30-59 ml/min (HCC)    "on dialysis for 3 months after my aneurysm in 2005" (02/20/2016)  . COPD (chronic obstructive pulmonary disease) ([place])   . Gout   . Heart murmur   . History of blood transfusion "several"  . Hypertension   . Lupus ([place])   . On home oxygen therapy    "3L; 24/7" (02/20/2016)  . OSA on CPAP   . Pulmonary HTN ([place])   . Rheumatoid arthritis(714.0) 11/06/2012  . S/P aortic dissection repair   . Tuberculosis    dormant carrier    Past Surgical History:  Procedure Laterality Date  . BREAST EXCISIONAL BIOPSY Right    benign  . BREAST LUMPECTOMY Right   . CARDIAC CATHETERIZATION N/A 10/24/2015   Procedure: Right Heart Cath;  Surgeon: Larey Dresser, MD;  Location: [place] CV LAB;  Service: Cardiovascular;  Laterality: N/A;  . CATARACT EXTRACTION W/ INTRAOCULAR LENS  IMPLANT, BILATERAL Bilateral 2016  . COLONOSCOPY N/A 08/19/2015   Procedure: COLONOSCOPY;  Surgeon: Jerene Bears, MD;  Location: [facility] ENDOSCOPY;  Service: Endoscopy;  Laterality: N/A;  . ENTEROSCOPY N/A 02/22/2016   Procedure: ENTEROSCOPY;  Surgeon: Milus Banister, MD;  Location: [place];  Service: Endoscopy;  Laterality: N/A;  . ESOPHAGOGASTRODUODENOSCOPY N/A 08/19/2015   Procedure: ESOPHAGOGASTRODUODENOSCOPY (EGD);  Surgeon: Jerene Bears, MD;  Location: [facility] ENDOSCOPY;  Service: Endoscopy;  Laterality: N/A;  patient scheduled, anesthesia aware of 1500 case, per [place].  08/18/15 DP  . GIVENS CAPSULE STUDY N/A 08/21/2015   Procedure: GIVENS CAPSULE STUDY;  Surgeon: Gatha Mayer, MD;  Location: [place];  Service: Endoscopy;  Laterality: N/A;  . HEMORRHOID SURGERY    . REPAIR OF ACUTE ASCENDING THORACIC AORTIC DISSECTION  2005  . RIGHT HEART CATHETERIZATION N/A 08/18/2014   Procedure: RIGHT HEART CATH;  Surgeon: Jolaine Artist, MD;  Location: [facility] CATH LAB;  Service: Cardiovascular;  Laterality: N/A;  . TEE WITHOUT CARDIOVERSION N/A 01/05/2015   Procedure: TRANSESOPHAGEAL ECHOCARDIOGRAM (TEE);  Surgeon: Larey Dresser, MD;  Location: [place];  Service: Cardiovascular;  Laterality: N/A;  . TEE WITHOUT CARDIOVERSION N/A 11/20/2017  Procedure: TRANSESOPHAGEAL ECHOCARDIOGRAM (TEE);  Surgeon: Larey Dresser, MD;  Location: [facility] ENDOSCOPY;  Service: Cardiovascular;  Laterality: N/A;  . WISDOM TOOTH EXTRACTION      There were no vitals filed for this visit.   Subjective Assessment - 01/27/18 1124    Subjective  pt is a 72 y.o F pt reports having a hx of SOB and fatigue that has been present seince 2006. pt reports no pain. pt currently uses home O2 and has been using it since 2006. pt currently accompanied with her social worker's assistance helping with tranportation. pt reports  standing is limited more due to her low back that has worsened over the course of the last few years.     Limitations  Standing;Walking    How long can you sit comfortably?  unlimited    How long can you stand comfortably?  5-10 if standing straight without assistance. 1 hour if in flexed position holding on to stable surface    How long can you walk comfortably?  5 min without AD    Diagnostic tests  Chest x-ray    Patient Stated Goals  to get heart better    Currently in Pain?  Yes    Pain Score  0-No pain at worst 7/10 in the low back    Pain Location  Back    Pain Orientation  Lower    Pain Descriptors / Indicators  Numbness;Throbbing    Pain Type  Chronic pain    Pain Onset  More than a month ago    Pain Frequency  Intermittent    Aggravating Factors   prolonged standing/ walking     Pain Relieving Factors  sitting as soon as possible.          [facility] PT Assessment - 01/27/18 1136      ROM / Strength   AROM / PROM / Strength  AROM;Strength      AROM   Overall AROM   Within functional limits for tasks performed    Overall AROM Comments  mild stiffness noted in R shoulder reaching behind the head      Strength   Overall Strength Comments  Bil UE strength WFL, functional strength noted in bil LE except limited strength in bil hips,    Strength Assessment Site  Hand    Right Hand Grip (lbs)  52    Left Hand Grip (lbs)  44      Ambulation/Gait   Ambulation/Gait  Yes    Gait Pattern  Antalgic;Trendelenburg;Lateral trunk lean to left;Decreased trunk rotation low to mid guard position    Gait Comments  during 6 min walk: stopped at 57 sec 95% adn 66 HR, lasting 37 seconds, again at 2:08 lasting 17 seconds 94% and HR 70, again at 2:40 lasting 1:24 94% HR 70.  and last break at 5:15 lasting 45 sec 86% and HR 70.        [facility] Pre-Surgical Assessment - 01/27/18 1136    5 Meter Walk Test- trial 1  8 sec    5 Meter Walk Test- trial 2  8 sec.     5 Meter Walk Test- trial 3  7 sec.     5 meter walk test average  7.67 sec    4 Stage Balance Test tolerated for:   3 sec.    4 Stage Balance Test Position  2    ADL/IADL Independent with:  Bathing;Dressing;Meal prep;Finances    ADL/IADL Needs Assistance with:  [place]aver work  ADL/IADL Fraility Index  Moderately frail    6 Minute Walk- Baseline  yes    BP (mmHg)  108/57    HR (bpm)  52    02 Sat (%RA)  93 % pt currently on 6 LPM O2    Modified Borg Scale for Dyspnea  7- Severe shortness of breath or very hard breathing    Perceived Rate of Exertion (Borg)  7- Very, very light    6 Minute Walk Post Test  yes    BP (mmHg)  124/59    HR (bpm)  70    02 Sat (%RA)  86 %    Modified Borg Scale for Dyspnea  4- somewhat severe    Perceived Rate of Exertion (Borg)  17- Very hard    Aerobic Endurance Distance Walked  422    Endurance additional comments  mulitple standing and sitting breaks needed during 6 min walk . pt at 72.69% disability compared to age related norm              Objective measurements completed on examination: See above findings.              PT Education - 01/27/18 1216    Education Details  dicsussed frailty score and benefit of using RW when she is out in public due to high risk fo falling and limited endurance.     Person(s) Educated  Patient    Methods  Explanation;Verbal cues    Comprehension  Verbalized understanding;Verbal cues required                  Plan - 01/27/18 1217    Clinical Impression Statement  See assessment in note    Clinical Presentation  Stable    Clinical Decision Making  Low    Rehab Potential  Good    PT Frequency  One time visit    PT Next Visit Plan  one time visit only for Mitra-clip evaluation    PT Home Exercise Plan  fall risk     Consulted and Agree with Plan of Care  Patient           Clinical Impression Statement: Pt is a 72 yo F presenting to OPPT for evaluation prior to possible Mitraclip surgery due to severe mitral  regurgitation. Pt reports onset of SOB and general fatigue with standing/ walking since 2006. Symptoms are limiting endurance. Pt presents with functional ROM and strength, limited balance and is at a high fall risk based on 4 stage balance test, mild limitation with  walking speed and significant limitation with  aerobic endurance per 6 minute walk test. Pt ambulated 422 feet and required multiple rest break limiting distance. patient's HR was 70 bpm and O2 was 86% on 6 LPM O2. Pt reported 4/10 shortness of breath on modified scale for dyspnea. SOB and fatigue increased significantly with 6 minute walk test. Based on the Short Physical Performance Battery, patient has a frailty rating of 5/12 with </= 5/12 considered frail.     Patient demonstrated the following deficits and impairments:     Visit Diagnosis: Other abnormalities of gait and mobility     Problem List Patient Active Problem List   Diagnosis Date Noted  . S/P aortic dissection repair   . Chronic thoracic aortic dissection (HCC)   . PAF (paroxysmal atrial fibrillation) ([place]) 11/06/2016  . Sepsis due to Escherichia coli ([place])   . Sepsis ([place]) 07/12/2016  . Thrombocytopenia ([place]) 07/12/2016  . Urinary  tract infection with hematuria   . Fever   . Hypotension   . Iron deficiency anemia due to chronic blood loss 11/03/2015  . Chronic diastolic CHF (congestive heart failure) ([place]) 09/26/2015  . Persistent atrial fibrillation ([place])   . History of colonic polyps   . Benign neoplasm of cecum   . Benign neoplasm of ascending colon   . Heme positive stool   . Anemia 08/17/2015  . Symptomatic anemia 08/16/2015  . HTN (hypertension) 08/16/2015  . Leukocytosis 08/16/2015  . Acute hyperkalemia 08/16/2015  . 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 Anemia of chronic disease 04/26/2015  . CKD (chronic kidney disease) stage 3, GFR 30-59 ml/min (HCC) 04/22/2015  . Normocytic anemia 04/04/2015  . Morbid obesity ([place]) 01/22/2015   . Mitral regurgitation 12/29/2014  . Dyspnea 08/18/2014  . Chronic respiratory failure with hypoxia ([place]) 08/09/2014  . 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 Rheumatoid arthritis ([place]) 11/06/2012  . COPD 11/06/2012  . OSA on CPAP 11/06/2012   [place] PT, DPT, LAT, ATC  01/27/18  12:32 PM      [facility] [address] [place], [place], [address] Phone: [phone]   Fax:  [phone]  Name: April Mueller MRN: [id number] Date of Birth: 1945/08/11

## 2018-01-27 NOTE — Progress Notes (Addendum)
Anesthesia PAT Evaluation:   Case:  [id number] Date/Time:  01/29/18 0830   Procedure:  MITRAL VALVE REPAIR (N/A )   Anesthesia type:  Monitor Anesthesia Care   Pre-op diagnosis:  Severe Mitral Regurgitation   Location:  [facility] CATH LAB 1 / [place] INVASIVE CV LAB   Provider:  Sherren Mocha, MD    (MitraClip).   DISCUSSION: Patient is a 72 year old female scheduled for the above procedure (MitraClip). She was seen in [place] by Dr. Bridgette Habermann ~ 2015 for the same procedure, but apparently MR not felt severe enough to intervene at that time. She is not a candidate for surgical mitral and/or tricuspid valve repair or replacement due to multiple severe comorbid medical conditions.   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 He wrote, "she would not be considered a candidate for conventional surgery under any circumstances" and also that "It might be reasonable to consider diagnostic coronary angiography and follow-up right heart catheterization, although this would be associated  with risk of nephrotoxicity and may not change long-term management.  The patient has not undergone formal radiographic imaging to follow-up her chronic aortic dissection in many years.  This could be considered but would also be associated with potential for nephrotoxicity, and it seems unlikely that any surgical intervention might be considered if the patient were to be found to have progressive aneurysmal enlargement or other complications of her aortic dissection." She was not inclined to evaluate her chronic dissection at that time.     At PAT this morning, she reported feeling weak and "not all there", but denied chest pain, increased SOB or presyncope/syncope. She has had good diuresis over the weekend. She was feeling bloated and did not really eat on Sunday 01/28/18 but did take in some fluids. She had a BM and bloating has improved, so feels she can increase her input today. Lungs diminished but clear. Heart sounds irregular, III/VI SEM. Edema (~ 2+) in her feet which she tells me is her baseline. Other than feeling more weak, she did not have any ongoing acute symptoms. She had her aide with her today, and did not feel her symptoms were bothersome enough to warrant further evaluation today. She denied dizziness. No conversational dyspnea noted. Advised that I would notify Dr. Antionette Char staff of this weekend's symptoms and to call cardiology or EMS if any progression of symptoms. (She lives alone and has an aide that comes by several days a week. She had a PT evaluation on 01/27/18.)   I spoke with nurse Ander Purpura at the [facility] regarding above. I also reviewed labs including ABG pH, Creatinine, PLT count. She will have Dr. Burt Knack review for any recommendations. I have  also discussed above with anesthesiologist Dr. Oren Bracket. (UPDATE 01/28/18 10:05 AM: Dr. Burt Knack has reviewed labs. He will order platelets if needed during the procedure.)   VS: BP (!) 111/46   Pulse  87   Temp 36.8 C   Resp 20   Ht 5\' 4"  (1.626 m)   Wt 169 lb 9.6 oz (76.9 kg)   SpO2 92%   BMI 29.11 kg/m   PROVIDERS: Lawerance Cruel, MD is PCP [name], MD is HF cardiologist. Last visit 01/17/18. She is not felt to be a good candidate for pulmonary vasodilators.  Baltazar Apo, MD is pulmonologist Gavin Pound, MD is rheumatologist  Lavonna Monarch, MD is hematologist. Last visit seen 03/11/17.   Scarlette Shorts, MD is GI. Last visit 07/26/17.  She reports that she does not see a nephrologist and that currently renal function is primarily managed by Dr. Aundra Dubin.  LABS: Preoperative labs and ABG noted. BUN/Cr 58/3.24 (previously 75/3.66 01/24/18, 79/4.13 01/20/18, 56/3.69 01/13/18, 56/3.02 01/07/18, 45/2.60 11/29/17. Creatinine had been in the 2.2-2.5 range until after 11/2017.). Total bilirubin 1.4, but AST/ALT WNL (total bilirubin was normal 06/2017). H/H 10/32.8, which is consistent with results in 11/2017 and 06/2017. PLT count is 48K (previously 66-83K since 01/2017). BNP > 700. (all labs ordered are listed, but only abnormal results are displayed)  Labs Reviewed  SURGICAL PCR SCREEN - Abnormal; Notable for the following components:      Result Value   Staphylococcus aureus POSITIVE (*)    All other components within normal limits  BLOOD GAS, ARTERIAL - Abnormal; Notable for the following components:   pH, Arterial 7.526 (*)    pO2, Arterial 77.9 (*)    Bicarbonate 30.7 (*)    Acid-Base Excess 7.5 (*)    All other components within normal limits  BRAIN NATRIURETIC PEPTIDE - Abnormal; Notable for the following components:   B Natriuretic Peptide 732.8 (*)    All other components within normal limits  CBC - Abnormal; Notable for the following components:   RBC 3.38 (*)    Hemoglobin 10.0 (*)    HCT 32.8 (*)    Platelets 48 (*)    All other components within normal limits  COMPREHENSIVE METABOLIC PANEL - Abnormal; Notable for the following components:   Glucose, Bld 114 (*)     BUN 58 (*)    Creatinine, Ser 3.24 (*)    Albumin 3.4 (*)    Total Bilirubin 1.4 (*)    GFR calc non Af Amer 13 (*)    GFR calc Af Amer 16 (*)    All other components within normal limits  HEMOGLOBIN A1C - Abnormal; Notable for the following components:   Hgb A1c MFr Bld 5.8 (*)    All other components within normal limits  PROTIME-INR - Abnormal; Notable for the following components:   Prothrombin Time 16.3 (*)    All other components within normal limits  URINALYSIS, ROUTINE W REFLEX MICROSCOPIC - Abnormal; Notable for the following components:   Leukocytes, UA MODERATE (*)    Bacteria, UA RARE (*)    All other components within normal limits  APTT  TYPE AND SCREEN    OTHER:  PFTs 12/04/17: FVC                 1.03 L  (43% predicted)           FEV1               0.70 L  (38% predicted)  FEF25-75        0.39 L  (22% predicted)           TLC                 2.43 L  (48% predicted) RV                   1.43 L  (65% predicted) DLCO              26% predicted    IMAGES:  CXR 01/27/18: IMPRESSION: Cardiomegaly. Probable mild pulmonary venous congestion. No other interval changes.   EKG: 01/27/18: Atrial flutter with variable block, LAD, non-specific ST/T wave abnormality. She has known aflutter.   CV: TEE 11/20/17: Study Conclusions - Left ventricle: The cavity size was mildly dilated. Wall   thickness was normal. Systolic function was normal. The estimated   ejection fraction was in the range of 55% to 60%. Wall motion was   normal; there were no regional wall motion abnormalities. - Aortic valve: There was no stenosis. There was trivial   regurgitation. - Aorta: S/p ascending aorta replacement. There was residual   dissection noted in the descending thoracic aorta. - Mitral valve: There was severe, eccentric, posteriorly-directed   mitral regurgitation. There appeared to be a partial flail   segment of the anterior leaflet in the region of A2 scallop.   There  was systolic flow reversal in the pulmonary vein doppler   signal. Unable to measure PISA given eccentricity of the jet. - Left atrium: The atrium was moderately dilated. No evidence of   thrombus in the atrial cavity or appendage. - Right ventricle: The cavity size was mildly dilated. Systolic   function was mildly reduced. - Right atrium: The atrium was moderately dilated. - Atrial septum: No ASD/PFO by color doppler. - Tricuspid valve: Moderate-severe TR with peak RV-RA gradient 43   mmHg. Impressions: - Severe mitral regurgitation, partial flail anterior leaflet   segment (region of A2).  [place] 10/24/15: 1. Mildly elevated left heart filling pressure.  2. Mild pulmonary hypertension, primarily pulmonary venous hypertension with PVR 1.9 WU. 3. Elevated right heart filling pressure.  April Mueller appears to have primarily RV failure.  Her PCWP is fairly optimized, I am going to continue her current diuretic regimen.  She is not a candidate for selective pulmonary vasodilators.   I don't have records of prior LHC. Notes appear to indicate that there would be increased risk of nephrotoxicity and results may not change long term management.   Past Medical History:  Diagnosis Date  . Anemia 03/23/15   transfusion  . Anxiety   . Aortic aneurysm ([place])   . Arthritis    "qwhere" (02/20/2016)  . CHF (congestive heart failure) ([place])   . Chronic lower back pain   . Chronic thoracic aortic dissection (HCC)    S/P repair ascending thoracic aortic dissection with chronic residual dissection involving transverse aortic arch and descending thoracic aorta  . CKD (chronic kidney disease) stage 3, GFR 30-59 ml/min (HCC)    "on dialysis for 3 months after my aneurysm in 2005" (02/20/2016)  . COPD (chronic obstructive pulmonary disease) ([place])   . Gout   . Heart murmur   . History of blood transfusion "several"  . Hypertension   . Lupus ([place])   . On home oxygen therapy    "3L; 24/7" (02/20/2016)  .  OSA on CPAP   . Pulmonary HTN ([place])   . Rheumatoid arthritis(714.0) 11/06/2012  .  S/P aortic dissection repair   . Tuberculosis    dormant carrier    Past Surgical History:  Procedure Laterality Date  . BREAST EXCISIONAL BIOPSY Right    benign  . BREAST LUMPECTOMY Right   . CARDIAC CATHETERIZATION N/A 10/24/2015   Procedure: Right Heart Cath;  Surgeon: Larey Dresser, MD;  Location: [place] CV LAB;  Service: Cardiovascular;  Laterality: N/A;  . CATARACT EXTRACTION W/ INTRAOCULAR LENS  IMPLANT, BILATERAL Bilateral 2016  . COLONOSCOPY N/A 08/19/2015   Procedure: COLONOSCOPY;  Surgeon: Jerene Bears, MD;  [facility] ENDOSCOPY;  Service: Endoscopy;  Laterality: N/A;  . ENTEROSCOPY N/A 02/22/2016   Procedure: ENTEROSCOPY;  Surgeon: Milus Banister, MD;  Location: [place];  Service: Endoscopy;  Laterality: N/A;  . ESOPHAGOGASTRODUODENOSCOPY N/A 08/19/2015   Procedure: ESOPHAGOGASTRODUODENOSCOPY (EGD);  Surgeon: Jerene Bears, MD;  Location: [facility] ENDOSCOPY;  Service: Endoscopy;  Laterality: N/A;  patient scheduled, anesthesia aware of 1500 case, per [place].  08/18/15 DP  . GIVENS CAPSULE STUDY N/A 08/21/2015   Procedure: GIVENS CAPSULE STUDY;  Surgeon: Gatha Mayer, MD;  Location: [place];  Service: Endoscopy;  Laterality: N/A;  . HEMORRHOID SURGERY    . REPAIR OF ACUTE ASCENDING THORACIC AORTIC DISSECTION  2005  . RIGHT HEART CATHETERIZATION N/A 08/18/2014   Procedure: RIGHT HEART CATH;  Surgeon: Jolaine Artist, MD;  Location: [facility] CATH LAB;  Service: Cardiovascular;  Laterality: N/A;  . TEE WITHOUT CARDIOVERSION N/A 01/05/2015   Procedure: TRANSESOPHAGEAL ECHOCARDIOGRAM (TEE);  Surgeon: Larey Dresser, MD;  Location: [place];  Service: Cardiovascular;  Laterality: N/A;  . TEE WITHOUT CARDIOVERSION N/A 11/20/2017   Procedure: TRANSESOPHAGEAL ECHOCARDIOGRAM (TEE);  Surgeon: Larey Dresser, MD;  Location: [facility] ENDOSCOPY;  Service: Cardiovascular;  Laterality: N/A;  . WISDOM TOOTH  EXTRACTION      MEDICATIONS: . acetaminophen (TYLENOL) 500 MG tablet  . allopurinol (ZYLOPRIM) 300 MG tablet  . ALPRAZolam (XANAX) 0.25 MG tablet  . atorvastatin (LIPITOR) 10 MG tablet  . Biotin 5000 MCG CAPS  . hydroxychloroquine (PLAQUENIL) 200 MG tablet  . metolazone (ZAROXOLYN) 2.5 MG tablet  . metoprolol succinate (TOPROL-XL) 25 MG 24 hr tablet  . Omega-3 Fatty Acids (FISH OIL) 1200 MG CAPS  . OXYGEN  . Polyethylene Glycol 400 (BLINK TEARS OP)  . potassium chloride (K-DUR,KLOR-CON) 10 MEQ tablet  . potassium chloride SA (K-DUR,KLOR-CON) 20 MEQ tablet  . Tiotropium Bromide-Olodaterol (STIOLTO RESPIMAT) 2.5-2.5 MCG/ACT AERS  . torsemide (DEMADEX) 20 MG tablet   No current facility-administered medications for this encounter.   Zaroxolyn is currently on hold due to her renal function. Dr. Aundra Dubin is having her take torsemide 80 mg Q AM and 60 mg Q PM.   April Gianotti, PA-C [facility]/Anesthesiology Phone [phone] 01/27/2018 3:26 PM

## 2018-01-29 ENCOUNTER — Inpatient Hospital Stay (HOSPITAL_COMMUNITY)
Admission: RE | Admit: 2018-01-29 | Discharge: 2018-02-07 | DRG: 228 | Disposition: A | Discharge status: Skilled Nursing Facility | Payer: Medicare Other | Attending: Internal Medicine | Admitting: Internal Medicine

## 2018-01-29 ENCOUNTER — Encounter (HOSPITAL_COMMUNITY): Payer: Self-pay | Admitting: Urology

## 2018-01-29 ENCOUNTER — Inpatient Hospital Stay (HOSPITAL_COMMUNITY): Payer: Medicare Other | Admitting: Certified Registered Nurse Anesthetist

## 2018-01-29 ENCOUNTER — Inpatient Hospital Stay (HOSPITAL_COMMUNITY): Payer: Medicare Other | Admitting: Vascular Surgery

## 2018-01-29 ENCOUNTER — Inpatient Hospital Stay (HOSPITAL_COMMUNITY): Payer: Medicare Other

## 2018-01-29 ENCOUNTER — Encounter (HOSPITAL_COMMUNITY)
Admission: RE | Disposition: A | Discharge status: Skilled Nursing Facility | Payer: Self-pay | Source: Home / Self Care | Attending: Internal Medicine

## 2018-01-29 DIAGNOSIS — G4733 Obstructive sleep apnea (adult) (pediatric): Secondary | ICD-10-CM | POA: Diagnosis present

## 2018-01-29 DIAGNOSIS — Z8249 Family history of ischemic heart disease and other diseases of the circulatory system: Secondary | ICD-10-CM

## 2018-01-29 DIAGNOSIS — J9622 Acute and chronic respiratory failure with hypercapnia: Secondary | ICD-10-CM | POA: Diagnosis not present

## 2018-01-29 DIAGNOSIS — N179 Acute kidney failure, unspecified: Secondary | ICD-10-CM | POA: Diagnosis not present

## 2018-01-29 DIAGNOSIS — M109 Gout, unspecified: Secondary | ICD-10-CM | POA: Diagnosis present

## 2018-01-29 DIAGNOSIS — R1310 Dysphagia, unspecified: Secondary | ICD-10-CM | POA: Diagnosis present

## 2018-01-29 DIAGNOSIS — M329 Systemic lupus erythematosus, unspecified: Secondary | ICD-10-CM | POA: Diagnosis present

## 2018-01-29 DIAGNOSIS — J9690 Respiratory failure, unspecified, unspecified whether with hypoxia or hypercapnia: Secondary | ICD-10-CM

## 2018-01-29 DIAGNOSIS — R011 Cardiac murmur, unspecified: Secondary | ICD-10-CM | POA: Diagnosis present

## 2018-01-29 DIAGNOSIS — Z9842 Cataract extraction status, left eye: Secondary | ICD-10-CM

## 2018-01-29 DIAGNOSIS — I340 Nonrheumatic mitral (valve) insufficiency: Secondary | ICD-10-CM | POA: Diagnosis present

## 2018-01-29 DIAGNOSIS — J9589 Other postprocedural complications and disorders of respiratory system, not elsewhere classified: Secondary | ICD-10-CM | POA: Diagnosis not present

## 2018-01-29 DIAGNOSIS — G8929 Other chronic pain: Secondary | ICD-10-CM | POA: Diagnosis present

## 2018-01-29 DIAGNOSIS — J9602 Acute respiratory failure with hypercapnia: Secondary | ICD-10-CM

## 2018-01-29 DIAGNOSIS — I482 Chronic atrial fibrillation, unspecified: Secondary | ICD-10-CM

## 2018-01-29 DIAGNOSIS — J810 Acute pulmonary edema: Secondary | ICD-10-CM

## 2018-01-29 DIAGNOSIS — E872 Acidosis: Secondary | ICD-10-CM | POA: Diagnosis not present

## 2018-01-29 DIAGNOSIS — M069 Rheumatoid arthritis, unspecified: Secondary | ICD-10-CM | POA: Diagnosis present

## 2018-01-29 DIAGNOSIS — J449 Chronic obstructive pulmonary disease, unspecified: Secondary | ICD-10-CM | POA: Diagnosis present

## 2018-01-29 DIAGNOSIS — Z87891 Personal history of nicotine dependence: Secondary | ICD-10-CM

## 2018-01-29 DIAGNOSIS — Z961 Presence of intraocular lens: Secondary | ICD-10-CM | POA: Diagnosis present

## 2018-01-29 DIAGNOSIS — J9621 Acute and chronic respiratory failure with hypoxia: Secondary | ICD-10-CM | POA: Diagnosis not present

## 2018-01-29 DIAGNOSIS — J441 Chronic obstructive pulmonary disease with (acute) exacerbation: Secondary | ICD-10-CM | POA: Diagnosis not present

## 2018-01-29 DIAGNOSIS — M545 Low back pain: Secondary | ICD-10-CM | POA: Diagnosis present

## 2018-01-29 DIAGNOSIS — D696 Thrombocytopenia, unspecified: Secondary | ICD-10-CM | POA: Diagnosis present

## 2018-01-29 DIAGNOSIS — Z9841 Cataract extraction status, right eye: Secondary | ICD-10-CM

## 2018-01-29 DIAGNOSIS — F419 Anxiety disorder, unspecified: Secondary | ICD-10-CM | POA: Diagnosis present

## 2018-01-29 DIAGNOSIS — N184 Chronic kidney disease, stage 4 (severe): Secondary | ICD-10-CM | POA: Diagnosis present

## 2018-01-29 DIAGNOSIS — R0902 Hypoxemia: Secondary | ICD-10-CM | POA: Diagnosis not present

## 2018-01-29 DIAGNOSIS — Z79899 Other long term (current) drug therapy: Secondary | ICD-10-CM

## 2018-01-29 DIAGNOSIS — Z6830 Body mass index (BMI) 30.0-30.9, adult: Secondary | ICD-10-CM

## 2018-01-29 DIAGNOSIS — I081 Rheumatic disorders of both mitral and tricuspid valves: Secondary | ICD-10-CM | POA: Diagnosis present

## 2018-01-29 DIAGNOSIS — R0602 Shortness of breath: Secondary | ICD-10-CM | POA: Diagnosis present

## 2018-01-29 DIAGNOSIS — Z833 Family history of diabetes mellitus: Secondary | ICD-10-CM

## 2018-01-29 DIAGNOSIS — J9601 Acute respiratory failure with hypoxia: Secondary | ICD-10-CM

## 2018-01-29 DIAGNOSIS — Z841 Family history of disorders of kidney and ureter: Secondary | ICD-10-CM

## 2018-01-29 DIAGNOSIS — I130 Hypertensive heart and chronic kidney disease with heart failure and stage 1 through stage 4 chronic kidney disease, or unspecified chronic kidney disease: Secondary | ICD-10-CM | POA: Diagnosis present

## 2018-01-29 DIAGNOSIS — M1990 Unspecified osteoarthritis, unspecified site: Secondary | ICD-10-CM | POA: Diagnosis present

## 2018-01-29 DIAGNOSIS — Z006 Encounter for examination for normal comparison and control in clinical research program: Secondary | ICD-10-CM

## 2018-01-29 DIAGNOSIS — G9340 Encephalopathy, unspecified: Secondary | ICD-10-CM

## 2018-01-29 DIAGNOSIS — E669 Obesity, unspecified: Secondary | ICD-10-CM | POA: Diagnosis present

## 2018-01-29 DIAGNOSIS — I5033 Acute on chronic diastolic (congestive) heart failure: Secondary | ICD-10-CM | POA: Diagnosis present

## 2018-01-29 DIAGNOSIS — G9341 Metabolic encephalopathy: Secondary | ICD-10-CM | POA: Diagnosis not present

## 2018-01-29 DIAGNOSIS — D631 Anemia in chronic kidney disease: Secondary | ICD-10-CM | POA: Diagnosis present

## 2018-01-29 DIAGNOSIS — J4489 Other specified chronic obstructive pulmonary disease: Secondary | ICD-10-CM

## 2018-01-29 DIAGNOSIS — I272 Pulmonary hypertension, unspecified: Secondary | ICD-10-CM | POA: Diagnosis present

## 2018-01-29 DIAGNOSIS — I361 Nonrheumatic tricuspid (valve) insufficiency: Secondary | ICD-10-CM | POA: Diagnosis not present

## 2018-01-29 DIAGNOSIS — Z9889 Other specified postprocedural states: Secondary | ICD-10-CM

## 2018-01-29 DIAGNOSIS — Z9981 Dependence on supplemental oxygen: Secondary | ICD-10-CM

## 2018-01-29 DIAGNOSIS — I5023 Acute on chronic systolic (congestive) heart failure: Secondary | ICD-10-CM | POA: Diagnosis not present

## 2018-01-29 DIAGNOSIS — Z95818 Presence of other cardiac implants and grafts: Secondary | ICD-10-CM

## 2018-01-29 HISTORY — PX: MITRAL VALVE REPAIR: CATH118311

## 2018-01-29 LAB — POCT I-STAT 3, ART BLOOD GAS (G3+)
BICARBONATE: 30.5 mmol/L — AB (ref 20.0–28.0)
O2 Saturation: 100 %
PH ART: 7.163 — AB (ref 7.350–7.450)
PO2 ART: 360 mmHg — AB (ref 83.0–108.0)
Patient temperature: 97.2
TCO2: 33 mmol/L — ABNORMAL HIGH (ref 22–32)
pCO2 arterial: 83.9 mmHg (ref 32.0–48.0)

## 2018-01-29 LAB — POCT ACTIVATED CLOTTING TIME
ACTIVATED CLOTTING TIME: 274 s
ACTIVATED CLOTTING TIME: 274 s
Activated Clotting Time: 235 seconds
Activated Clotting Time: 301 seconds
Activated Clotting Time: 318 seconds
Activated Clotting Time: 296 seconds

## 2018-01-29 LAB — COOXEMETRY PANEL
Carboxyhemoglobin: 1.1 % (ref 0.5–1.5)
METHEMOGLOBIN: 1.5 % (ref 0.0–1.5)
O2 SAT: 64.8 %
TOTAL HEMOGLOBIN: 11.6 g/dL — AB (ref 12.0–16.0)

## 2018-01-29 LAB — GLUCOSE, CAPILLARY: Glucose-Capillary: 138 mg/dL — ABNORMAL HIGH (ref 70–99)

## 2018-01-29 SURGERY — TRANSCATHETER MITRAL EDGE TO EDGE REPAIR
Anesthesia: General

## 2018-01-29 MED ORDER — SODIUM CHLORIDE 0.9 % IV SOLN
1.5000 g | INTRAVENOUS | Status: DC
  Filled 2018-01-29: qty 1.5

## 2018-01-29 MED ORDER — ATORVASTATIN CALCIUM 10 MG PO TABS
10.0000 mg | ORAL_TABLET | Freq: Every day | ORAL | Status: DC
  Administered 2018-01-30 – 2018-02-07 (×9): 10 mg via ORAL
  Filled 2018-01-29 (×9): qty 1

## 2018-01-29 MED ORDER — MILRINONE LACTATE IN DEXTROSE 20-5 MG/100ML-% IV SOLN
0.1250 ug/kg/min | INTRAVENOUS | Status: DC
  Filled 2018-01-29: qty 100

## 2018-01-29 MED ORDER — HYDROXYCHLOROQUINE SULFATE 200 MG PO TABS
200.0000 mg | ORAL_TABLET | Freq: Two times a day (BID) | ORAL | Status: DC
  Administered 2018-01-29: 200 mg
  Filled 2018-01-29 (×2): qty 1

## 2018-01-29 MED ORDER — POLYETHYLENE GLYCOL 400 0.25 % OP SOLN: Freq: Every day | OPHTHALMIC | Status: DC

## 2018-01-29 MED ORDER — MIDAZOLAM HCL 2 MG/2ML IJ SOLN
1.0000 mg | INTRAMUSCULAR | Status: DC | PRN
  Administered 2018-01-30: 1 mg via INTRAVENOUS
  Filled 2018-01-29: qty 2

## 2018-01-29 MED ORDER — SODIUM CHLORIDE 0.9 % IV SOLN
INTRAVENOUS | Status: DC | PRN
  Administered 2018-01-29: 09:00:00 via INTRAVENOUS

## 2018-01-29 MED ORDER — SODIUM CHLORIDE 0.9% FLUSH
3.0000 mL | Freq: Two times a day (BID) | INTRAVENOUS | Status: DC
  Administered 2018-01-29 – 2018-02-01 (×5): 3 mL via INTRAVENOUS

## 2018-01-29 MED ORDER — FENTANYL CITRATE (PF) 100 MCG/2ML IJ SOLN
50.0000 ug | INTRAMUSCULAR | Status: AC | PRN
  Administered 2018-01-29 (×3): 50 ug via INTRAVENOUS
  Filled 2018-01-29 (×3): qty 2

## 2018-01-29 MED ORDER — ALLOPURINOL 300 MG PO TABS: 300.0000 mg | ORAL_TABLET | Freq: Every day | ORAL | Status: DC

## 2018-01-29 MED ORDER — LEVALBUTEROL HCL 0.63 MG/3ML IN NEBU: 0.6300 mg | INHALATION_SOLUTION | Freq: Four times a day (QID) | RESPIRATORY_TRACT | Status: DC

## 2018-01-29 MED ORDER — DEXAMETHASONE SODIUM PHOSPHATE 10 MG/ML IJ SOLN
INTRAMUSCULAR | Status: DC | PRN
  Administered 2018-01-29: 10 mg via INTRAVENOUS

## 2018-01-29 MED ORDER — FENTANYL CITRATE (PF) 100 MCG/2ML IJ SOLN
INTRAMUSCULAR | Status: DC | PRN
  Administered 2018-01-29: 50 ug via INTRAVENOUS

## 2018-01-29 MED ORDER — PHENYLEPHRINE 40 MCG/ML (10ML) SYRINGE FOR IV PUSH (FOR BLOOD PRESSURE SUPPORT)
PREFILLED_SYRINGE | INTRAVENOUS | Status: DC | PRN
  Administered 2018-01-29 (×4): 80 ug via INTRAVENOUS

## 2018-01-29 MED ORDER — SODIUM CHLORIDE 0.45 % IV SOLN: INTRAVENOUS | Status: DC

## 2018-01-29 MED ORDER — ACETAMINOPHEN 500 MG PO TABS: 1000.0000 mg | ORAL_TABLET | Freq: Four times a day (QID) | ORAL | Status: DC | PRN

## 2018-01-29 MED ORDER — FENTANYL CITRATE (PF) 100 MCG/2ML IJ SOLN
INTRAMUSCULAR | Status: AC
  Filled 2018-01-29: qty 2

## 2018-01-29 MED ORDER — ALPRAZOLAM 0.25 MG PO TABS: 0.2500 mg | ORAL_TABLET | Freq: Two times a day (BID) | ORAL | Status: DC | PRN

## 2018-01-29 MED ORDER — TORSEMIDE 20 MG PO TABS: 80.0000 mg | ORAL_TABLET | Freq: Every day | ORAL | Status: DC

## 2018-01-29 MED ORDER — LEVALBUTEROL HCL 0.63 MG/3ML IN NEBU
0.6300 mg | INHALATION_SOLUTION | Freq: Four times a day (QID) | RESPIRATORY_TRACT | Status: DC
  Administered 2018-01-29 – 2018-01-30 (×3): 0.63 mg via RESPIRATORY_TRACT
  Filled 2018-01-29 (×2): qty 3

## 2018-01-29 MED ORDER — MIDAZOLAM HCL 2 MG/2ML IJ SOLN
INTRAMUSCULAR | Status: AC
  Filled 2018-01-29: qty 2

## 2018-01-29 MED ORDER — NOREPINEPHRINE 4 MG/250ML-% IV SOLN
0.0000 ug/min | INTRAVENOUS | Status: DC
  Administered 2018-01-29 – 2018-01-31 (×4): 6 ug/min via INTRAVENOUS
  Administered 2018-01-31: 5 ug/min via INTRAVENOUS
  Filled 2018-01-29 (×6): qty 250

## 2018-01-29 MED ORDER — HEPARIN (PORCINE) IN NACL 2-0.9 UNITS/ML
INTRAMUSCULAR | Status: AC | PRN
  Administered 2018-01-29 (×3): 1000 mL

## 2018-01-29 MED ORDER — HEPARIN (PORCINE) IN NACL 1000-0.9 UT/500ML-% IV SOLN
INTRAVENOUS | Status: AC
  Filled 2018-01-29: qty 1500

## 2018-01-29 MED ORDER — SODIUM CHLORIDE 0.9% FLUSH: 3.0000 mL | INTRAVENOUS | Status: DC | PRN

## 2018-01-29 MED ORDER — VANCOMYCIN HCL 1000 MG IV SOLR
INTRAVENOUS | Status: DC | PRN
  Administered 2018-01-29: 1250 mg via INTRAVENOUS

## 2018-01-29 MED ORDER — MIDAZOLAM HCL 2 MG/2ML IJ SOLN
1.0000 mg | INTRAMUSCULAR | Status: DC | PRN
  Filled 2018-01-29: qty 2

## 2018-01-29 MED ORDER — SUGAMMADEX SODIUM 200 MG/2ML IV SOLN
INTRAVENOUS | Status: DC | PRN
  Administered 2018-01-29: 200 mg via INTRAVENOUS

## 2018-01-29 MED ORDER — SODIUM CHLORIDE 0.9 % IV SOLN
INTRAVENOUS | Status: DC | PRN
  Administered 2018-01-29: 1.5 g via INTRAVENOUS

## 2018-01-29 MED ORDER — CHLORHEXIDINE GLUCONATE 4 % EX LIQD
60.0000 mL | Freq: Once | CUTANEOUS | Status: DC
  Filled 2018-01-29: qty 60

## 2018-01-29 MED ORDER — EPHEDRINE SULFATE 50 MG/ML IJ SOLN
INTRAMUSCULAR | Status: DC | PRN
  Administered 2018-01-29 (×2): 5 mg via INTRAVENOUS

## 2018-01-29 MED ORDER — CHLORHEXIDINE GLUCONATE 4 % EX LIQD
30.0000 mL | CUTANEOUS | Status: DC
  Filled 2018-01-29: qty 30

## 2018-01-29 MED ORDER — HYDROXYCHLOROQUINE SULFATE 200 MG PO TABS
200.0000 mg | ORAL_TABLET | Freq: Two times a day (BID) | ORAL | Status: DC
  Filled 2018-01-29: qty 1

## 2018-01-29 MED ORDER — PROPOFOL 10 MG/ML IV BOLUS
INTRAVENOUS | Status: DC | PRN
  Administered 2018-01-29: 80 mg via INTRAVENOUS

## 2018-01-29 MED ORDER — HEPARIN SODIUM (PORCINE) 1000 UNIT/ML IJ SOLN
INTRAMUSCULAR | Status: DC | PRN
  Administered 2018-01-29: 2000 [IU] via INTRAVENOUS
  Administered 2018-01-29: 4000 [IU] via INTRAVENOUS
  Administered 2018-01-29: 7000 [IU] via INTRAVENOUS
  Administered 2018-01-29 (×2): 4000 [IU] via INTRAVENOUS

## 2018-01-29 MED ORDER — FUROSEMIDE 10 MG/ML IJ SOLN
80.0000 mg | Freq: Once | INTRAMUSCULAR | Status: AC
  Administered 2018-01-29: 80 mg via INTRAVENOUS

## 2018-01-29 MED ORDER — ONDANSETRON HCL 4 MG/2ML IJ SOLN: 4.0000 mg | Freq: Four times a day (QID) | INTRAMUSCULAR | Status: DC | PRN

## 2018-01-29 MED ORDER — ONDANSETRON HCL 4 MG/2ML IJ SOLN
INTRAMUSCULAR | Status: DC | PRN
  Administered 2018-01-29: 4 mg via INTRAVENOUS

## 2018-01-29 MED ORDER — FENTANYL CITRATE (PF) 100 MCG/2ML IJ SOLN
50.0000 ug | INTRAMUSCULAR | Status: DC | PRN
  Administered 2018-01-30 (×2): 50 ug via INTRAVENOUS
  Filled 2018-01-29 (×2): qty 2

## 2018-01-29 MED ORDER — POTASSIUM CHLORIDE 20 MEQ/15ML (10%) PO SOLN
40.0000 meq | Freq: Two times a day (BID) | ORAL | Status: DC
  Administered 2018-01-29: 40 meq
  Filled 2018-01-29: qty 30

## 2018-01-29 MED ORDER — LIDOCAINE 2% (20 MG/ML) 5 ML SYRINGE
INTRAMUSCULAR | Status: DC | PRN
  Administered 2018-01-29: 100 mg via INTRAVENOUS

## 2018-01-29 MED ORDER — IPRATROPIUM BROMIDE 0.02 % IN SOLN
0.5000 mg | Freq: Four times a day (QID) | RESPIRATORY_TRACT | Status: DC
  Administered 2018-01-29 – 2018-01-30 (×3): 0.5 mg via RESPIRATORY_TRACT
  Filled 2018-01-29 (×3): qty 2.5

## 2018-01-29 MED ORDER — ORAL CARE MOUTH RINSE
15.0000 mL | OROMUCOSAL | Status: DC
  Administered 2018-01-29 – 2018-01-30 (×5): 15 mL via OROMUCOSAL

## 2018-01-29 MED ORDER — MILRINONE LACTATE IN DEXTROSE 20-5 MG/100ML-% IV SOLN
INTRAVENOUS | Status: DC | PRN
  Administered 2018-01-29: 0.375 ug/kg/min via INTRAVENOUS

## 2018-01-29 MED ORDER — POLYVINYL ALCOHOL 1.4 % OP SOLN
1.0000 [drp] | Freq: Every day | OPHTHALMIC | Status: DC
  Administered 2018-01-29 – 2018-02-06 (×9): 1 [drp] via OPHTHALMIC
  Filled 2018-01-29: qty 15

## 2018-01-29 MED ORDER — POTASSIUM CHLORIDE CRYS ER 20 MEQ PO TBCR: 40.0000 meq | EXTENDED_RELEASE_TABLET | Freq: Two times a day (BID) | ORAL | Status: DC

## 2018-01-29 MED ORDER — FUROSEMIDE 10 MG/ML IJ SOLN
INTRAMUSCULAR | Status: AC
  Filled 2018-01-29: qty 8

## 2018-01-29 MED ORDER — BIOTIN 5000 MCG PO CAPS: 5000.0000 ug | ORAL_CAPSULE | Freq: Every day | ORAL | Status: DC

## 2018-01-29 MED ORDER — VANCOMYCIN HCL 10 G IV SOLR
1250.0000 mg | INTRAVENOUS | Status: DC
  Filled 2018-01-29: qty 1250

## 2018-01-29 MED ORDER — NOREPINEPHRINE 4 MG/250ML-% IV SOLN
0.0000 ug/min | INTRAVENOUS | Status: DC
  Filled 2018-01-29: qty 250

## 2018-01-29 MED ORDER — MILRINONE LACTATE IN DEXTROSE 20-5 MG/100ML-% IV SOLN
0.1250 ug/kg/min | INTRAVENOUS | Status: DC
  Administered 2018-01-29 – 2018-01-31 (×2): 0.125 ug/kg/min via INTRAVENOUS
  Filled 2018-01-29 (×2): qty 100

## 2018-01-29 MED ORDER — VANCOMYCIN HCL 10 G IV SOLR: 1250.0000 mg | INTRAVENOUS | Status: DC

## 2018-01-29 MED ORDER — MUPIROCIN 2 % EX OINT: 1.0000 "application " | TOPICAL_OINTMENT | Freq: Two times a day (BID) | CUTANEOUS | Status: DC

## 2018-01-29 MED ORDER — SODIUM CHLORIDE 0.9 % IV SOLN
INTRAVENOUS | Status: DC
  Administered 2018-01-29: 20 mL via INTRAVENOUS
  Administered 2018-01-29 – 2018-01-31 (×3): via INTRAVENOUS

## 2018-01-29 MED ORDER — SODIUM CHLORIDE 0.9 % IV SOLN: 1.5000 g | INTRAVENOUS | Status: DC

## 2018-01-29 MED ORDER — LEVALBUTEROL HCL 0.63 MG/3ML IN NEBU
0.6300 mg | INHALATION_SOLUTION | RESPIRATORY_TRACT | Status: DC | PRN
Start: 2018-01-29 — End: 2018-02-07
  Filled 2018-01-29: qty 3

## 2018-01-29 MED ORDER — CHLORHEXIDINE GLUCONATE 0.12 % MT SOLN
OROMUCOSAL | Status: AC
  Administered 2018-01-29: 15 mL via OROMUCOSAL
  Filled 2018-01-29: qty 15

## 2018-01-29 MED ORDER — ASPIRIN 81 MG PO CHEW
81.0000 mg | CHEWABLE_TABLET | Freq: Every day | ORAL | Status: DC
  Administered 2018-01-30 – 2018-02-07 (×9): 81 mg via ORAL
  Filled 2018-01-29 (×9): qty 1

## 2018-01-29 MED ORDER — CHLORHEXIDINE GLUCONATE 0.12% ORAL RINSE (MEDLINE KIT)
15.0000 mL | Freq: Two times a day (BID) | OROMUCOSAL | Status: DC
  Administered 2018-01-29 – 2018-01-30 (×2): 15 mL via OROMUCOSAL

## 2018-01-29 MED ORDER — SODIUM CHLORIDE 0.9 % IV SOLN: 250.0000 mL | INTRAVENOUS | Status: DC | PRN

## 2018-01-29 MED ORDER — CHLORHEXIDINE GLUCONATE 0.12 % MT SOLN
15.0000 mL | Freq: Once | OROMUCOSAL | Status: AC
  Administered 2018-01-29: 15 mL via OROMUCOSAL
  Filled 2018-01-29: qty 15

## 2018-01-29 MED ORDER — NOREPINEPHRINE BITARTRATE 1 MG/ML IV SOLN
INTRAVENOUS | Status: DC | PRN
  Administered 2018-01-29: 1 ug/min via INTRAVENOUS

## 2018-01-29 MED ORDER — PROTAMINE SULFATE 10 MG/ML IV SOLN
INTRAVENOUS | Status: DC | PRN
  Administered 2018-01-29: 40 mg via INTRAVENOUS

## 2018-01-29 MED ORDER — FENTANYL CITRATE (PF) 100 MCG/2ML IJ SOLN
50.0000 ug | Freq: Once | INTRAMUSCULAR | Status: AC
  Administered 2018-01-29: 50 ug via INTRAVENOUS

## 2018-01-29 MED ORDER — ROCURONIUM BROMIDE 10 MG/ML (PF) SYRINGE
PREFILLED_SYRINGE | INTRAVENOUS | Status: DC | PRN
  Administered 2018-01-29: 60 mg via INTRAVENOUS
  Administered 2018-01-29 (×3): 20 mg via INTRAVENOUS

## 2018-01-29 MED ORDER — HEPARIN SODIUM (PORCINE) 1000 UNIT/ML IJ SOLN: INTRAMUSCULAR | Status: DC | PRN

## 2018-01-29 MED ORDER — METOPROLOL SUCCINATE ER 25 MG PO TB24: 25.0000 mg | ORAL_TABLET | Freq: Two times a day (BID) | ORAL | Status: DC

## 2018-01-29 MED ORDER — MIDAZOLAM HCL 2 MG/2ML IJ SOLN
2.0000 mg | Freq: Once | INTRAMUSCULAR | Status: AC
  Administered 2018-01-29: 2 mg via INTRAVENOUS

## 2018-01-29 SURGICAL SUPPLY — 17 items
BLANKET WARM UNDERBOD FULL ACC (MISCELLANEOUS) ×3
CATHETER STEERABLE GUIDE (CATHETERS) ×3
CLIP MITRACLIP NTR (Prosthesis & Implant Heart) ×6 IMPLANT
COVER PRB 48X5XTLSCP FOLD TPE (BAG) ×1
COVER PROBE 5X48 (BAG) ×2
KIT DILATOR VASC 18G NDL (KITS) ×3
KIT HEART LEFT (KITS) ×3
MITRACLIP 1 SGC 2 NTR MBR0102 (KITS) ×3
NEEDLE BAYLIS TRANSSEPTAL 71CM (NEEDLE) ×3
PACK CARDIAC CATHETERIZATION (CUSTOM PROCEDURE TRAY) ×3
PERCLOSE PROGLIDE 6F (VASCULAR PRODUCTS) ×6
SHEATH PINNACLE 6F 10CM (SHEATH) ×3
STOPCOCK MORSE 400PSI 3WAY (MISCELLANEOUS) ×18
TRANSDUCER W/STOPCOCK (MISCELLANEOUS) ×3
TUBING CIL FLEX 10 FLL-RA (TUBING) ×3
WIRE AMPLATZ WHISKJ .035X260CM (WIRE) ×3
WIRE EMERALD 3MM-J .035X150CM (WIRE) ×3

## 2018-01-29 NOTE — Consult Note (Addendum)
PULMONARY / CRITICAL CARE MEDICINE   Name: April Mueller MRN: [id number] DOB: 03-Sep-1945    ADMISSION DATE:  01/29/2018 CONSULTATION DATE:  6/26  REFERRING MD:  Dr. 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 She is followed by Dr. Burt Knack and presented to [facility] for elective percutaneous mitral valve repair for symptom palliation (she had been deemed not a candidate for open procedure). The procedure was without complication, but the postoperative course was complicated by hypoxia and lethargy. She was started on BiPAP. ABG was drawn and demonstrated profound respiratory acidosis. She was emergently intubated by anesthesia and was transferred to ICU for further care. PCCM consulted.   PAST MEDICAL HISTORY :  She  has a past medical history of Anemia (03/23/15), Anxiety, Aortic aneurysm ([place]), Arthritis, CHF (congestive heart failure) ([place]), Chronic lower back pain, Chronic thoracic aortic dissection ([place]), CKD (chronic kidney disease) stage 3, GFR 30-59 ml/min ([facility]), COPD (chronic obstructive pulmonary disease) ([place]), Gout, Heart murmur, History of blood transfusion ("several"), Hypertension, Lupus ([place]), On home oxygen therapy, OSA on CPAP, Pulmonary HTN ([place]), Rheumatoid arthritis(714.0) (11/06/2012), S/P aortic dissection repair, and Tuberculosis.  PAST SURGICAL HISTORY: She  has a past surgical history that includes right heart catheterization (N/A, 08/18/2014); TEE without cardioversion (N/A, 01/05/2015); Wisdom tooth extraction; Givens capsule study (N/A, 08/21/2015); Esophagogastroduodenoscopy (N/A, 08/19/2015); Colonoscopy (N/A, 08/19/2015); Cardiac catheterization (N/A, 10/24/2015); Hemorrhoid surgery; Breast lumpectomy (Right);  Cataract extraction w/ intraocular lens  implant, bilateral (Bilateral, 2016); enteroscopy (N/A, 02/22/2016); Breast excisional biopsy (Right); TEE without cardioversion (N/A, 11/20/2017); and Repair of acute ascending thoracic aortic dissection (2005).  No Known Allergies  No current facility-administered medications on file prior to encounter.    Current Outpatient Medications on File Prior to Encounter  Medication Sig  . allopurinol (ZYLOPRIM) 300 MG tablet Take 300 mg by mouth daily.  [name] ALPRAZolam (XANAX) 0.25 MG tablet Take 1 tablet (0.25 mg total) by mouth every 12 (twelve) hours as needed for anxiety or sleep. (Patient taking differently: Take 0.25 mg by mouth at bedtime. )  . atorvastatin (LIPITOR) 10 MG tablet Take 1 tablet (10 mg total) by mouth daily.  . Biotin 5000 MCG CAPS Take 5,000 mcg by mouth daily.   . hydroxychloroquine (PLAQUENIL) 200 MG tablet Take 200 mg by mouth 2 (two) times daily.  . metoprolol succinate (TOPROL-XL) 25 MG 24 hr tablet Take 1 tablet (25 mg total) by mouth 2 (two) times daily.  . Omega-3 Fatty Acids (FISH OIL) 1200 MG CAPS Take 1,200 mg by mouth daily.  . OXYGEN Inhale 3 L into the lungs continuous.   . Polyethylene Glycol 400 (BLINK TEARS OP) Place 1-2 drops into both eyes at bedtime.  . potassium chloride SA (K-DUR,KLOR-CON) 20 MEQ tablet Take 40 mEq by mouth 2 (two) times daily.  [name] acetaminophen (TYLENOL) 500 MG tablet Take 1,000 mg by mouth every 6 (six) hours as needed for moderate pain or headache.  . potassium chloride (K-DUR,KLOR-CON) 10 MEQ tablet Take 2 tablets (20 mEq total) by mouth 2 (two) times daily. Take an extra tab on Saturday and Wednesday when you take Metolazone (Patient not taking: Reported on 01/21/2018)  . Tiotropium Bromide-Olodaterol (STIOLTO RESPIMAT) 2.5-2.5 MCG/ACT AERS Inhale 2 puffs into the lungs daily. (Patient  not taking: Reported on 01/21/2018)    FAMILY HISTORY:  Her indicated that the status of her mother is unknown. She  indicated that the status of her father is unknown. She indicated that her maternal grandmother is deceased. She indicated that her maternal grandfather is deceased. She indicated that her paternal grandmother is deceased. She indicated that her paternal grandfather is deceased. She indicated that the status of her maternal aunt is unknown. She indicated that the status of her neg hx is unknown.   SOCIAL HISTORY: She  reports that she quit smoking about 14 years ago. Her smoking use included cigarettes. She has a 21.00 pack-year smoking history. She has never used smokeless tobacco. She reports that she drinks alcohol. She reports that she does not use drugs.  REVIEW OF SYSTEMS:   unable  SUBJECTIVE:    VITAL SIGNS: BP (!) 94/50   Pulse 76   Temp (!) 97.3 F (36.3 C) (Temporal)   Resp 18   Ht 5\' 4"  (1.626 m)   SpO2 100%   BMI 29.11 kg/m   HEMODYNAMICS:    VENTILATOR SETTINGS: Vent Mode: PRVC FiO2 (%):  [100 %] 100 % Set Rate:  [20 bmp] 20 bmp Vt Set:  [450 mL] 450 mL PEEP:  [5 cmH20] 5 cmH20 Plateau Pressure:  [19 cmH20] 19 cmH20  INTAKE / OUTPUT: No intake/output data recorded.  PHYSICAL EXAMINATION: General:  Elderly chronically ill appearing female on vent Neuro:  Sedated. RASS -2 HEENT:  /AT, PERRL, unable to appreciate JVD Cardiovascular:  IRIR, 5/6 murmur. 3+ BLE pitting edema Lungs:  Diminished breath sounds, scattered basilar crackles Abdomen:  Soft, non-distended Musculoskeletal:  No acute deformity Skin:  Grossly intact  LABS:  BMET Recent Labs  Lab 01/24/18 1418 01/27/18 0953  NA 143 141  K 4.0 3.7  CL 102 101  CO2 30 26  BUN 75* 58*  CREATININE 3.66* 3.24*  GLUCOSE 106* 114*    Electrolytes Recent Labs  Lab 01/24/18 1418 01/27/18 0953  CALCIUM 10.0 10.1    CBC Recent Labs  Lab 01/27/18 0953  WBC 7.1  HGB 10.0*  HCT 32.8*  PLT 48*    Coag's Recent Labs  Lab 01/27/18 0953  APTT 33  INR 1.32    Sepsis Markers No  results for input(s): LATICACIDVEN, PROCALCITON, O2SATVEN in the last 168 hours.  ABG Recent Labs  Lab 01/27/18 0953  PHART 7.526*  PCO2ART 37.3  PO2ART 77.9*    Liver Enzymes Recent Labs  Lab 01/27/18 0953  AST 34  ALT 22  ALKPHOS 101  BILITOT 1.4*  ALBUMIN 3.4*    Cardiac Enzymes No results for input(s): TROPONINI, PROBNP in the last 168 hours.  Glucose No results for input(s): GLUCAP in the last 168 hours.  Imaging No results found.   STUDIES:  TEE 4/17 > LVEF 55-60% Echo 6/26 >  TEE 6/26 >   CULTURES:   ANTIBIOTICS:   SIGNIFICANT EVENTS: 6/26 mitral valve repair, post op lethargy. Intubated.   LINES/TUBES: ETT 6/26 > CVL 6/26 >  DISCUSSION: 72 year old female with cardiac history who presented for elective mitral valve repair 6/26. Postoperative course complicated by lethargy, hypoxia, and hypercarbia causing acidosis. She was intubated and transferred to ICU.    ASSESSMENT / PLAN:  PULMONARY A: Acute on chronic mixed respiratory failure (on home O2) COPD without acute exacerbation OSA on CPAP  P:   STAT intubation ABG CXR VAP bundle Scheduled and PRN nebs  CARDIOVASCULAR A:  Severe  MR now s/p Mitral valve repair 3/14 Chronic diastolic CHF Severe TR Hypotension s/p intubation. At risk cardiogenic shock  P:  Cardiology primary Advanced heart failure service to see.  Continue levophed titrated to MAP goal > 1mmHg  RENAL A:   CKD: serum creatinine at baseline  P:   Follow BMP  GASTROINTESTINAL A:   No acute issues  P:   NPO for now Protonix for SUP  HEMATOLOGIC A:   Thrombocytopenia  P:  Follow CBC Monitor for signs of bleeding  INFECTIOUS A:   No acute issues  P:   Follow WBC and fever curve  ENDOCRINE A:   No acute issues  P:   Follow glucose on chemistry  NEUROLOGIC A:   Acute metabolic encephalopathy  P:   RASS goal: -1 to -2 PRN fentanyl and PRN versed for RASS goal.    FAMILY  -  Updates:   - Inter-disciplinary family meet or Palliative Care meeting due by:  7/3   Georgann Housekeeper, AGACNP-BC [facility]/Critical Care Pager [phone] or [phone]  01/29/2018 4:46 PM  Attending Note:  72 year old female with a-fib not on anticoagulation, severe MR and TR insuffiencey that were both repaired.  Post op, the patient was extubated, became hypoxemic and hypercarbic and developed respiratory failure.  Patient also has history of CKD.  Post op, patient was reintubated and PCCM was called on consultation.  On exam, patient was completely unresponsive with diffuse crackles.  I reviewed CXR myself, pulmonary edema noted.  Will admit to the ICU, full vent support, adjust vent for ABG.  Norepi for BP support.  PCCM will continue to follow.  The patient is critically ill with multiple organ systems failure and requires high complexity decision making for assessment and support, frequent evaluation and titration of therapies, application of advanced monitoring technologies and extensive interpretation of multiple databases.   Critical Care Time devoted to patient care services described in this note is  35  Minutes. This time reflects time of care of this signee Dr Jennet Maduro. This critical care time does not reflect procedure time, or teaching time or supervisory time of PA/NP/Med student/Med Resident etc but could involve care discussion time.  Rush Farmer, M.D. [facility] Pulmonary/Critical Care Medicine. Pager: [phone]. After hours pager: [phone].

## 2018-01-29 NOTE — Progress Notes (Addendum)
Anesthesia here. Intubated orally w/7.5 ET, secured at 23cm at rt upper lip

## 2018-01-29 NOTE — Anesthesia Procedure Notes (Signed)
Procedure Name: Intubation Date/Time: 01/29/2018 8:46 AM Performed by: Colin Benton, CRNA Pre-anesthesia Checklist: Patient identified, Emergency Drugs available, Suction available and Patient being monitored Patient Re-evaluated:Patient Re-evaluated prior to induction Oxygen Delivery Method: Circle system utilized Preoxygenation: Pre-oxygenation with 100% oxygen Induction Type: IV induction Ventilation: Mask ventilation without difficulty Laryngoscope Size: Miller and 2 Grade View: Grade I Tube type: Oral Tube size: 7.0 mm Number of attempts: 1 Airway Equipment and Method: Stylet Placement Confirmation: ETT inserted through vocal cords under direct vision,  positive ETCO2 and breath sounds checked- equal and bilateral Secured at: 22 cm Tube secured with: Tape Dental Injury: Teeth and Oropharynx as per pre-operative assessment

## 2018-01-29 NOTE — Progress Notes (Signed)
  Echocardiogram 2D Echocardiogram has been performed.  April Mueller 01/29/2018, 9:23 AM

## 2018-01-29 NOTE — Progress Notes (Signed)
Rt groin dressing saturated; Manual pressure held x 30 minutes then dressing changed. No hematoma.

## 2018-01-29 NOTE — Anesthesia Procedure Notes (Signed)
Procedure Name: Intubation Date/Time: 01/29/2018 4:02 PM Performed by: Oleta Mouse, MD Pre-anesthesia Checklist: Patient identified, Emergency Drugs available, Patient being monitored and Timeout performed Patient Re-evaluated:Patient Re-evaluated prior to induction Oxygen Delivery Method: Ambu bag Preoxygenation: Pre-oxygenation with 100% oxygen Laryngoscope Size: Glidescope and 3 Grade View: Grade I Tube type: Subglottic suction tube Tube size: 7.5 mm Number of attempts: 1 Placement Confirmation: ETT inserted through vocal cords under direct vision,  CO2 detector and breath sounds checked- equal and bilateral Secured at: 22 cm Dental Injury: Teeth and Oropharynx as per pre-operative assessment

## 2018-01-29 NOTE — Interval H&P Note (Signed)
History and Physical Interval Note:  01/29/2018 7:25 AM  April Mueller  has presented today for surgery, with the diagnosis of Severe Mitral Regurgitation  The various methods of treatment have been discussed with the patient and family. After consideration of risks, benefits and other options for treatment, the patient has consented to  Procedure(s): MITRAL VALVE REPAIR (N/A) as a surgical intervention .  The patient's history has been reviewed, patient examined, no change in status, stable for surgery.  I have reviewed the patient's chart and labs.  Questions were answered to the patient's satisfaction.    Pt examined in Short Stay. Daughter at bedside. Continues to struggle with dyspnea with minimal activity and at rest. Lost weight initially with metolazone, but renal function worsened and diuretics decreased. She now complains of worsening leg swelling and dyspnea. Her symptoms are consistent with acute on chronic systolic heart failure, with NYHA functional class IV limitation.   Pt with chronic thrombocytopenia, platelets lower than baseline at 48,000. Platelets typed and screened, confirmed availability with blood bank if needed.   Sherren Mocha MD

## 2018-01-29 NOTE — Progress Notes (Addendum)
Respiratory therapy here to put on BIPAP. Less responsive. Labored respirations. Gently suctioning mouth for small amount of blood tinged plelgm

## 2018-01-29 NOTE — Consult Note (Addendum)
Advanced Heart Failure Team Consult Note   Primary Physician: Lawerance Cruel, MD PCP-Cardiologist:  No primary care provider on file.  HF MD: Dr Aundra Dubin   Reason for Consultation: Heart Failure   HPI:    April Mueller is seen today for evaluation of heart failure at the request of Dr Burt Knack   [name] is a 72 y.o. with severe COPD on home oxygen, rheumatoid arthritis, chronic atrial fibrillation, chronic diastolic CHF, Type A aortic dissection 2005,  GI bleed, anemia, CKD, OSA, and MR.     Moved back from [place] and saw Dr. Lamonte Sakai. In [place] was followed by cardiology and pulmonary. Started on Tracleer in 2006. Revatio was added, however was discontinued without any change in her symptoms. Stopped smoking in January 2005 when she had aortic aneurysm repair. Smoked 1.5-2 ppd x 15 - 20 years.  Later on macitentan.  Followed by closely by Dr Burt Knack and Dr Aundra Dubin in the community for severe MR/TR and advanced HF. She was not thought be a candidate for surgical mitral/tricuspid valve repair due to muliple co morbidities.  Today she presented for for mitral clip. Post operatively she developed acute hypercapneic respiratory failure equiring intubation. CCM consulted.    TEE Intraop 01/29/2018 Preop: Severe MR with partially flail A2 segment of the anterior leaflet Normal LV systolic function Severe Primary MR, Grade 4+ Post-op: Unchanged LV systolic function trace residual MR  Review of Systems: [y] = yes, [ ]  = no Intubated information obtained from the chart.  General: Weight gain [ ] ; Weight loss [ ] ; Anorexia [ ] ; Fatigue [ ] ; Fever [ ] ; Chills [ ] ; Weakness [ ]   Cardiac: Chest pain/pressure [ ] ; Resting SOB [ ] ; Exertional SOB [ ] ; Orthopnea [ ] ; Pedal Edema [ ] ; Palpitations [ ] ; Syncope [ ] ; Presyncope [ ] ; Paroxysmal nocturnal dyspnea[ ]   Pulmonary: Cough [ ] ; Wheezing[ ] ; Hemoptysis[ ] ; Sputum [ ] ; Snoring [ ]   GI: Vomiting[ ] ; Dysphagia[ ] ; Melena[ ] ; Hematochezia [ ] ;  Heartburn[ ] ; Abdominal pain [ ] ; Constipation [ ] ; Diarrhea [ ] ; BRBPR [ ]   GU: Hematuria[ ] ; Dysuria [ ] ; Nocturia[ ]   Vascular: Pain in legs with walking [ ] ; Pain in feet with lying flat [ ] ; Non-healing sores [ ] ; Stroke [ ] ; TIA [ ] ; Slurred speech [ ] ;  Neuro: Headaches[ ] ; Vertigo[ ] ; Seizures[ ] ; Paresthesias[ ] ;Blurred vision [ ] ; Diplopia [ ] ; Vision changes [ ]   Ortho/Skin: Arthritis [ ] ; Joint pain [ ] ; Muscle pain [ ] ; Joint swelling [ ] ; Back Pain [ ] ; Rash [ ]   Psych: Depression[ ] ; Anxiety[ ]   Heme: Bleeding problems [ ] ; Clotting disorders [ ] ; Anemia [ ]   Endocrine: Diabetes [ ] ; Thyroid dysfunction[ ]   Home Medications Prior to Admission medications   Medication Sig Start Date End Date Taking? Authorizing Provider  allopurinol (ZYLOPRIM) 300 MG tablet Take 300 mg by mouth daily.   Yes [provider]  ALPRAZolam (XANAX) 0.25 MG tablet Take 1 tablet (0.25 mg total) by mouth every 12 (twelve) hours as needed for anxiety or sleep. Patient taking differently: Take 0.25 mg by mouth at bedtime.  07/24/16  Yes Lauree Chandler, NP  atorvastatin (LIPITOR) 10 MG tablet Take 1 tablet (10 mg total) by mouth daily. 10/28/17  Yes Larey Dresser, MD  Biotin 5000 MCG CAPS Take 5,000 mcg by mouth daily.    Yes [provider]  hydroxychloroquine (PLAQUENIL) 200 MG tablet Take 200  mg by mouth 2 (two) times daily.   Yes [provider]  metoprolol succinate (TOPROL-XL) 25 MG 24 hr tablet Take 1 tablet (25 mg total) by mouth 2 (two) times daily. 10/28/17  Yes Larey Dresser, MD  Omega-3 Fatty Acids (FISH OIL) 1200 MG CAPS Take 1,200 mg by mouth daily.   Yes [provider]  OXYGEN Inhale 3 L into the lungs continuous.    Yes [provider]  Polyethylene Glycol 400 (BLINK TEARS OP) Place 1-2 drops into both eyes at bedtime.   Yes [provider]  potassium chloride SA (K-DUR,KLOR-CON) 20 MEQ tablet Take 40 mEq by mouth 2 (two) times  daily.   Yes [provider]  torsemide (DEMADEX) 20 MG tablet Take 4 tablets (80 mg total) by mouth daily. 01/22/18  Yes Larey Dresser, MD  acetaminophen (TYLENOL) 500 MG tablet Take 1,000 mg by mouth every 6 (six) hours as needed for moderate pain or headache.    [provider]  metolazone (ZAROXOLYN) 2.5 MG tablet Take 1 tablet (2.5 mg total) by mouth once a week. Take Every Saturday. 01/17/18   Larey Dresser, MD  potassium chloride (K-DUR,KLOR-CON) 10 MEQ tablet Take 2 tablets (20 mEq total) by mouth 2 (two) times daily. Take an extra tab on Saturday and Wednesday when you take Metolazone Patient not taking: Reported on 01/21/2018 01/07/18   Larey Dresser, MD  Tiotropium Bromide-Olodaterol (STIOLTO RESPIMAT) 2.5-2.5 MCG/ACT AERS Inhale 2 puffs into the lungs daily. Patient not taking: Reported on 01/21/2018 12/17/17   Collene Gobble, MD    Past Medical History: Past Medical History:  Diagnosis Date  . Anemia 03/23/15   transfusion  . Anxiety   . Aortic aneurysm ([place])   . Arthritis    "qwhere" (02/20/2016)  . CHF (congestive heart failure) ([place])   . Chronic lower back pain   . Chronic thoracic aortic dissection (HCC)    S/P repair ascending thoracic aortic dissection with chronic residual dissection involving transverse aortic arch and descending thoracic aorta  . CKD (chronic kidney disease) stage 3, GFR 30-59 ml/min (H[facility])    "on dialysis for 3 months after my aneurysm in 2005" (02/20/2016)  . COPD (chronic obstructive pulmonary disease) ([place])   . Gout   . Heart murmur   . History of blood transfusion "several"  . Hypertension   . Lupus ([place])   . On home oxygen therapy    "3L; 24/7" (02/20/2016)  . OSA on CPAP   . Pulmonary HTN ([place])   . Rheumatoid arthritis(714.0) 11/06/2012  . S/P aortic dissection repair   . Tuberculosis    dormant carrier    Past Surgical History: Past Surgical History:  Procedure Laterality Date  . BREAST EXCISIONAL BIOPSY Right      benign  . BREAST LUMPECTOMY Right   . CARDIAC CATHETERIZATION N/A 10/24/2015   Procedure: Right Heart Cath;  Surgeon: Larey Dresser, MD;  Location: [facility];  Service: Cardiovascular;  Laterality: N/A;  . CATARACT EXTRACTION W/ INTRAOCULAR LENS  IMPLANT, BILATERAL Bilateral 2016  . COLONOSCOPY N/A 08/19/2015   Procedure: COLONOSCOPY;  Surgeon: Jerene Bears, MD;  Location: [facility] ENDOSCOPY;  Service: Endoscopy;  Laterality: N/A;  . ENTEROSCOPY N/A 02/22/2016   Procedure: ENTEROSCOPY;  Surgeon: Milus Banister, MD;  Location: [facility];  Service: Endoscopy;  Laterality: N/A;  . ESOPHAGOGASTRODUODENOSCOPY N/A 08/19/2015   Procedure: ESOPHAGOGASTRODUODENOSCOPY (EGD);  Surgeon: Jerene Bears, MD;  Location: [facility] ENDOSCOPY;  Service: Endoscopy;  Laterality: N/A;  patient scheduled, anesthesia aware of 1500 case, per [place].  08/18/15 DP  . GIVENS CAPSULE STUDY N/A 08/21/2015   Procedure: GIVENS CAPSULE STUDY;  Surgeon: Gatha Mayer, MD;  Location: [place];  Service: Endoscopy;  Laterality: N/A;  . HEMORRHOID SURGERY    . REPAIR OF ACUTE ASCENDING THORACIC AORTIC DISSECTION  2005  . RIGHT HEART CATHETERIZATION N/A 08/18/2014   Procedure: RIGHT HEART CATH;  Surgeon: Jolaine Artist, MD;  Location[facility] CATH LAB;  Service: Cardiovascular;  Laterality: N/A;  . TEE WITHOUT CARDIOVERSION N/A 01/05/2015   Procedure: TRANSESOPHAGEAL ECHOCARDIOGRAM (TEE);  Surgeon: Larey Dresser, MD;  Location: [facility];  Service: Cardiovascular;  Laterality: N/A;  . TEE WITHOUT CARDIOVERSION N/A 11/20/2017   Procedure: TRANSESOPHAGEAL ECHOCARDIOGRAM (TEE);  Surgeon: Larey Dresser, MD;  Location: [facility] ENDOSCOPY;  Service: Cardiovascular;  Laterality: N/A;  . WISDOM TOOTH EXTRACTION      Family History: Family History  Problem Relation Age of Onset  . Heart attack Mother   . Prostate cancer Father   . Diabetes Brother   . Kidney failure Brother   . Breast cancer Maternal Aunt   . Colon cancer Neg Hx    . Stomach cancer Neg Hx     Social History: Social History   Socioeconomic History  . Marital status: Legally Separated    Spouse name: Not on file  . Number of children: 1  . Years of education: Not on file  . Highest education level: Not on file  Occupational History  . Occupation: Disability    Comment: Office Work  Scientific laboratory technician  . Financial resource strain: Not on file  . Food insecurity:    Worry: Not on file    Inability: Not on file  . Transportation needs:    Medical: Not on file    Non-medical: Not on file  Tobacco Use  . Smoking status: Former Smoker    Packs/day: 1.50    Years: 14.00    Pack years: 21.00    Types: Cigarettes    Last attempt to quit: 08/09/2003    Years since quitting: 14.4  . Smokeless tobacco: Never Used  Substance and Sexual Activity  . Alcohol use: Yes    Alcohol/week: 0.0 oz    Comment: 02/20/2016 "drank a little in my teens"  . Drug use: No  . Sexual activity: Never  Lifestyle  . Physical activity:    Days per week: Not on file    Minutes per session: Not on file  . Stress: Not on file  Relationships  . Social connections:    Talks on phone: Not on file    Gets together: Not on file    Attends religious service: Not on file    Active member of club or organization: Not on file    Attends meetings of clubs or organizations: Not on file    Relationship status: Not on file  Other Topics Concern  . Not on file  Social History Narrative  . Not on file    Allergies:  No Known Allergies  Objective:    Vital Signs:   Temp:  [97.3 F (36.3 C)-98.7 F (37.1 C)] 97.3 F (36.3 C) (06/26 1345) Pulse Rate:  [0-182] 76 (06/26 1605) Resp:  [0-88] 18 (06/26 1605) BP: (85-146)/(12-81) 94/50 (06/26 1605) SpO2:  [0 %-100 %] 100 % (06/26 1605) FiO2 (%):  [100 %] 100 % (06/26 1605)    Weight change: There were no  vitals filed for this visit.  Intake/Output:   Intake/Output Summary (Last 24 hours) at 01/29/2018 1611 Last data  filed at 01/29/2018 1351 Gross per 24 hour  Intake 1312.5 ml  Output 300 ml  Net 1012.5 ml      Physical Exam    General:  Intubated/sedated   HEENT: ETT Neck: supple. JVP difficult to assess with distress. Carotids 2+ bilat; no bruits. No lymphadenopathy or thyromegaly appreciated. Cor: PMI nondisplaced. Irregular rate & rhythm. No rubs, gallops or murmurs. Lungs:  Abdomen: soft, nontender, nondistended. No hepatosplenomegaly. No bruits or masses. Good bowel sounds. Extremities: no cyanosis, clubbing, rash, edema Neuro: intubated/sedated   Telemetry   A fib   EKG    A fib   Labs   Basic Metabolic Panel: Recent Labs  Lab 01/24/18 1418 01/27/18 0953  NA 143 141  K 4.0 3.7  CL 102 101  CO2 30 26  GLUCOSE 106* 114*  BUN 75* 58*  CREATININE 3.66* 3.24*  CALCIUM 10.0 10.1    Liver Function Tests: Recent Labs  Lab 01/27/18 0953  AST 34  ALT 22  ALKPHOS 101  BILITOT 1.4*  PROT 7.8  ALBUMIN 3.4*   No results for input(s): LIPASE, AMYLASE in the last 168 hours. No results for input(s): AMMONIA in the last 168 hours.  CBC: Recent Labs  Lab 01/27/18 0953  WBC 7.1  HGB 10.0*  HCT 32.8*  MCV 97.0  PLT 48*    Cardiac Enzymes: No results for input(s): CKTOTAL, CKMB, CKMBINDEX, TROPONINI in the last 168 hours.  BNP: BNP (last 3 results) Recent Labs    01/27/18 0953  BNP 732.8*    ProBNP (last 3 results) No results for input(s): PROBNP in the last 8760 hours.   CBG: No results for input(s): GLUCAP in the last 168 hours.  Coagulation Studies: Recent Labs    01/27/18 0953  LABPROT 16.3*  INR 1.32     Imaging    No results found.   Medications:     Current Medications:   Infusions: . sodium chloride 20 mL/hr at 01/29/18 0715       Patient Profile   Ms Mcconnon is a 72 y.o. with COPD on home oxygen, rheumatoid arthritis, chronic atrial fibrillation, chronic diastolic CHF, Type A aortic dissection 2005,  GI bleed, anemia,  CKD, OSA, and MR.     Admitted for mitral clip.   Assessment/Plan  1. Severe MR, S/P Mitral Clip Repair Complicated by respiratory failure   2. Acute Hypoxic Respiratory Failure  Urgent intubation 01/29/2018  3. Acute/Diastolic Heart Failure Volume status elevated. Flash pulmonary edema.  Diurese with IV lasix.   4. Chronic A fib  Rate controlled She has not been on anticoagulants with history of GI Bleed.  5. 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     Length of Stay: 0  Darrick Grinder, NP  01/29/2018, 4:11 PM  Advanced Heart Failure Team Pager [phone] (M-F; 7a - 4p)  Please contact [facility] for night-coverage after hours (4p -7a ) and weekends on [email or web address]  Agree with above.  72 y/o woman with severe COPD on home O2, PAH, RA, CKD and severe MR/TR with advanced diastolic HF who has been followed closely in the HF Clinic by Dr. Aundra Dubin. She has had refractory HF in the setting of severe valvular disease.  Management complicated by her PAH and advanced lung disease and CKD. Has been turned down for surgical MVR/TVR. Presented to day for MV Clip repair. On arrival had moderate volume overload but in NAD. Started on milrinone pre-procedure but Anesthesiology  MitraClip repair went well with very mild/trivial MR post Clip. Patient extubated and moved to recovery area in cath lab. In the recovery area, patient become hypotensive and increasingly lethargic. Placed on BiPAP with minimal improvement. I evaluated the patient with Dr. Burt Knack. ABG obtained with pH 7.19 and PCO2 87. Patient emergently intubated and started on norepi for BP support with good response  Patient moved to ICU.   On exam on bipap.minimally responsive to noxious stimuli JVP to jaw LIJ TLC with bleeding around site Cor IRR no MR Lungs coarse Ab obese NT Ext 2-3+ edema.     72 y/o patient as above with acute hypercarbic respiratory failure post MitraClip procedure. Now intubated. On NE. Seems to be responding well. CCM on board. Will follow in ICU. Continue pressors and start IV diuresis. Follow co-ox and CVP. Hoepfully can extubate soon.   CRITICAL CARE Performed by: Glori Bickers  Total critical care time: 35 minutes  Critical care time was exclusive of separately billable procedures and treating other patients.  Critical care was necessary to treat or prevent imminent or life-threatening deterioration.  Critical care was time spent personally by me (independent of midlevel providers or residents) on the following activities: development of treatment plan with patient and/or surrogate as well as nursing, discussions with consultants, evaluation of patient's response to treatment, examination of patient, obtaining history from patient or surrogate, ordering and performing treatments and interventions, ordering and review of laboratory studies, ordering and review of radiographic studies, pulse oximetry and re-evaluation of patient's condition.  Glori Bickers, MD  8:28 PM

## 2018-01-29 NOTE — Progress Notes (Addendum)
Called for worsening respiratory status. Pt lethargic, O2 sats in 80% range. arousable to voice.   Initial improvement to O2 sat 90% with nonrebreather. Will try BiPap. Given lasix 80 mg IV x 1 and portable CXR ordered. If she doesn't improve quickly will call CCM. Will also ask the Advanced HF team to see her. She has advanced heart failure and COPD with FEV1 0.7 at baseline. Bed assignment changed to [place].  Sherren Mocha MD 01/29/2018 3:32 PM

## 2018-01-29 NOTE — Anesthesia Procedure Notes (Signed)
Arterial Line Insertion Start/End6/26/2019 7:50 AM, 01/29/2018 8:00 AM Performed by: Colin Benton, CRNA, CRNA  Patient location: Pre-op. Preanesthetic checklist: patient identified, IV checked, site marked, risks and benefits discussed, surgical consent, monitors and equipment checked, pre-op evaluation, timeout performed and anesthesia consent Lidocaine 1% used for infiltration Left was placed Catheter size: 20 G Hand hygiene performed , maximum sterile barriers used  and Seldinger technique used Allen's test indicative of satisfactory collateral circulation Attempts: 1 Procedure performed without using ultrasound guided technique. Following insertion, Biopatch and dressing applied. Post procedure assessment: normal and unchanged  Patient tolerated the procedure well with no immediate complications.

## 2018-01-29 NOTE — Progress Notes (Signed)
Placed on non-rebreather

## 2018-01-29 NOTE — Progress Notes (Signed)
  Echocardiogram Echocardiogram Transesophageal has been performed.  April Mueller 01/29/2018, 2:04 PM

## 2018-01-29 NOTE — Anesthesia Preprocedure Evaluation (Addendum)
Anesthesia Evaluation  Patient identified by MRN, date of birth, ID band Patient awake    Reviewed: Allergy & Precautions, NPO status , Patient's Chart, lab work & pertinent test results, reviewed documented beta blocker date and time   History of Anesthesia Complications Negative for: history of anesthetic complications  Airway Mallampati: II  TM Distance: >3 FB Neck ROM: Full    Dental  (+) Teeth Intact   Pulmonary shortness of breath and Long-Term Oxygen Therapy, sleep apnea , COPD, former smoker,    breath sounds clear to auscultation       Cardiovascular hypertension, Pt. on medications and Pt. on home beta blockers + Peripheral Vascular Disease and +CHF  + dysrhythmias Atrial Fibrillation + Valvular Problems/Murmurs MR  Rhythm:Irregular     Neuro/Psych    GI/Hepatic negative GI ROS, Neg liver ROS,   Endo/Other    Renal/GU CRFRenal disease     Musculoskeletal  (+) Arthritis ,   Abdominal   Peds  Hematology  (+) anemia ,   Anesthesia Other Findings  - Left ventricle: The cavity size was mildly dilated. Wall   thickness was normal. Systolic function was normal. The estimated   ejection fraction was in the range of 55% to 60%. Wall motion was   normal; there were no regional wall motion abnormalities. - Aortic valve: There was no stenosis. There was trivial   regurgitation. - Aorta: S/p ascending aorta replacement. There was residual   dissection noted in the descending thoracic aorta. - Mitral valve: There was severe, eccentric, posteriorly-directed   mitral regurgitation. There appeared to be a partial flail   segment of the anterior leaflet in the region of A2 scallop.   There was systolic flow reversal in the pulmonary vein doppler   signal. Unable to measure PISA given eccentricity of the jet. - Left atrium: The atrium was moderately dilated. No evidence of   thrombus in the atrial cavity or  appendage. - Right ventricle: The cavity size was mildly dilated. Systolic   function was mildly reduced. - Right atrium: The atrium was moderately dilated. - Atrial septum: No ASD/PFO by color doppler. - Tricuspid valve: Moderate-severe TR with peak RV-RA gradient 43   mmHg.  Reproductive/Obstetrics                            Anesthesia Physical Anesthesia Plan  ASA: IV  Anesthesia Plan: General   Post-op Pain Management:    Induction: Intravenous  PONV Risk Score and Plan: 3 and Ondansetron and Dexamethasone  Airway Management Planned: Oral ETT  Additional Equipment: Arterial line, CVP, Ultrasound Guidance Line Placement and TEE  Intra-op Plan:   Post-operative Plan: Extubation in OR  Informed Consent: I have reviewed the patients History and Physical, chart, labs and discussed the procedure including the risks, benefits and alternatives for the proposed anesthesia with the patient or authorized representative who has indicated his/her understanding and acceptance.   Dental advisory given  Plan Discussed with: CRNA  Anesthesia Plan Comments:         Anesthesia Quick Evaluation

## 2018-01-29 NOTE — Plan of Care (Signed)
  Problem: Clinical Measurements: Goal: Ability to maintain clinical measurements within normal limits will improve Outcome: Progressing Goal: Respiratory complications will improve Outcome: Progressing Goal: Cardiovascular complication will be avoided Outcome: Progressing   

## 2018-01-29 NOTE — Anesthesia Procedure Notes (Signed)
Central Venous Catheter Insertion Performed by: Oleta Mouse, MD, anesthesiologist Start/End6/26/2019 8:48 AM, 01/29/2018 8:58 AM Patient location: OR. Preanesthetic checklist: patient identified, IV checked, site marked, risks and benefits discussed, surgical consent, monitors and equipment checked, pre-op evaluation, timeout performed and anesthesia consent Position: supine Hand hygiene performed  and maximum sterile barriers used  Catheter size: 8 Fr Total catheter length 16. Central line was placed.Double lumen Procedure performed using ultrasound guided technique. Ultrasound Notes:anatomy identified, needle tip was noted to be adjacent to the nerve/plexus identified, no ultrasound evidence of intravascular and/or intraneural injection and image(s) printed for medical record Attempts: 1 Following insertion, dressing applied, line sutured and Biopatch. Post procedure assessment: blood return through all ports, free fluid flow and no air  Patient tolerated the procedure well with no immediate complications.

## 2018-01-29 NOTE — Transfer of Care (Signed)
Immediate Anesthesia Transfer of Care Note  Patient: April Mueller  Procedure(s) Performed: MITRAL VALVE REPAIR (N/A )  Patient Location: Cath Lab  Anesthesia Type:General  Level of Consciousness: drowsy and patient cooperative  Airway & Oxygen Therapy: Patient Spontanous Breathing and Patient connected to face mask oxygen  Post-op Assessment: Report given to RN and Post -op Vital signs reviewed and stable  Post vital signs: Reviewed and stable  Last Vitals:  Vitals Value Taken Time  BP 97/45 01/29/2018  1:49 PM  Temp    Pulse 69 01/29/2018  1:51 PM  Resp 30 01/29/2018  1:51 PM  SpO2 100 % 01/29/2018  1:51 PM  Vitals shown include unvalidated device data.  Last Pain:  Vitals:   01/29/18 0708  TempSrc:   PainSc: 0-No pain      Patients Stated Pain Goal: 0 (34/91/79 1505)  Complications: No apparent anesthesia complications

## 2018-01-29 NOTE — Progress Notes (Addendum)
ABG drawn. Dr. Sung Amabile in.

## 2018-01-29 NOTE — Progress Notes (Signed)
[facility] Progress Note Patient Name: April Mueller DOB: [birth year] MRN: [id number]   Date of Service  01/29/2018  HPI/Events of Note  Pt with OG tube  eICU Interventions  Change Plaquenil and KCL orders to via OG tube        [employer] 01/29/2018, 9:15 PM

## 2018-01-29 NOTE — Progress Notes (Addendum)
Dr. Burt Knack paged regarding labored respirations. In to see patient.  Very weak cough that sounds congested, deep and thick. Giving lasix IV. Left IJ site oozing. Dr. Burt Knack aware.

## 2018-01-30 ENCOUNTER — Other Ambulatory Visit: Payer: Self-pay | Admitting: Physician Assistant

## 2018-01-30 ENCOUNTER — Encounter (HOSPITAL_COMMUNITY): Payer: Self-pay | Admitting: Cardiovascular Disease

## 2018-01-30 ENCOUNTER — Ambulatory Visit: Payer: Medicare Other | Admitting: Emergency Medicine

## 2018-01-30 ENCOUNTER — Other Ambulatory Visit: Payer: Self-pay

## 2018-01-30 ENCOUNTER — Inpatient Hospital Stay (HOSPITAL_COMMUNITY): Payer: Medicare Other

## 2018-01-30 DIAGNOSIS — I361 Nonrheumatic tricuspid (valve) insufficiency: Secondary | ICD-10-CM

## 2018-01-30 DIAGNOSIS — J810 Acute pulmonary edema: Secondary | ICD-10-CM

## 2018-01-30 DIAGNOSIS — Z9889 Other specified postprocedural states: Secondary | ICD-10-CM

## 2018-01-30 DIAGNOSIS — J9690 Respiratory failure, unspecified, unspecified whether with hypoxia or hypercapnia: Secondary | ICD-10-CM

## 2018-01-30 LAB — ECHOCARDIOGRAM COMPLETE: HEIGHTINCHES: 64 in

## 2018-01-30 LAB — BLOOD GAS, ARTERIAL
ACID-BASE EXCESS: 0.9 mmol/L (ref 0.0–2.0)
BICARBONATE: 26.6 mmol/L (ref 20.0–28.0)
Drawn by: 535471
FIO2: 100
LHR: 20 {breaths}/min
O2 SAT: 99.3 %
PCO2 ART: 53.8 mmHg — AB (ref 32.0–48.0)
PEEP: 5 cmH2O
PH ART: 7.31 — AB (ref 7.350–7.450)
Patient temperature: 97.2
VT: 450 mL
pO2, Arterial: 387 mmHg — ABNORMAL HIGH (ref 83.0–108.0)

## 2018-01-30 LAB — CBC
HCT: 28.7 % — ABNORMAL LOW (ref 36.0–46.0)
HEMATOCRIT: 28.5 % — AB (ref 36.0–46.0)
HEMOGLOBIN: 8.6 g/dL — AB (ref 12.0–15.0)
HEMOGLOBIN: 8.7 g/dL — AB (ref 12.0–15.0)
MCH: 29.2 pg (ref 26.0–34.0)
MCH: 30.1 pg (ref 26.0–34.0)
MCHC: 30.2 g/dL (ref 30.0–36.0)
MCHC: 30.3 g/dL (ref 30.0–36.0)
MCV: 96.6 fL (ref 78.0–100.0)
MCV: 99.3 fL (ref 78.0–100.0)
PLATELETS: 75 10*3/uL — AB (ref 150–400)
Platelets: 55 10*3/uL — ABNORMAL LOW (ref 150–400)
RBC: 2.95 MIL/uL — AB (ref 3.87–5.11)
RBC: 2.89 MIL/uL — AB (ref 3.87–5.11)
RDW: 14.7 % (ref 11.5–15.5)
RDW: 17.6 % — ABNORMAL HIGH (ref 11.5–15.5)
WBC: 9.6 10*3/uL (ref 4.0–10.5)
WBC: 10.3 10*3/uL (ref 4.0–10.5)

## 2018-01-30 LAB — COOXEMETRY PANEL
Carboxyhemoglobin: 1.7 % — ABNORMAL HIGH (ref 0.5–1.5)
METHEMOGLOBIN: 1.1 % (ref 0.0–1.5)
O2 Saturation: 82.2 %
TOTAL HEMOGLOBIN: 12.2 g/dL (ref 12.0–16.0)

## 2018-01-30 LAB — BASIC METABOLIC PANEL
Anion gap: 10 (ref 5–15)
Anion gap: 10 (ref 5–15)
BUN: 71 mg/dL — AB (ref 8–23)
BUN: 81 mg/dL — AB (ref 8–23)
CHLORIDE: 101 mmol/L (ref 98–111)
CO2: 25 mmol/L (ref 22–32)
CO2: 27 mmol/L (ref 22–32)
CREATININE: 4.28 mg/dL — AB (ref 0.44–1.00)
Calcium: 9.4 mg/dL (ref 8.9–10.3)
Calcium: 9.3 mg/dL (ref 8.9–10.3)
Chloride: 99 mmol/L (ref 98–111)
Creatinine, Ser: 3.9 mg/dL — ABNORMAL HIGH (ref 0.44–1.00)
GFR calc Af Amer: 12 mL/min — ABNORMAL LOW (ref >60)
GFR calc Af Amer: 11 mL/min — ABNORMAL LOW (ref >60)
GFR calc non Af Amer: 10 mL/min — ABNORMAL LOW (ref >60)
GFR, EST NON AFRICAN AMERICAN: 11 mL/min — AB (ref >60)
GLUCOSE: 187 mg/dL — AB (ref 70–99)
GLUCOSE: 187 mg/dL — AB (ref 70–99)
POTASSIUM: 4.9 mmol/L (ref 3.5–5.1)
POTASSIUM: 4.8 mmol/L (ref 3.5–5.1)
Sodium: 136 mmol/L (ref 135–145)
Sodium: 136 mmol/L (ref 135–145)

## 2018-01-30 LAB — GLUCOSE, CAPILLARY
GLUCOSE-CAPILLARY: 165 mg/dL — AB (ref 70–99)
GLUCOSE-CAPILLARY: 149 mg/dL — AB (ref 70–99)
Glucose-Capillary: 187 mg/dL — ABNORMAL HIGH (ref 70–99)

## 2018-01-30 MED ORDER — POTASSIUM CHLORIDE CRYS ER 20 MEQ PO TBCR: 40.0000 meq | EXTENDED_RELEASE_TABLET | Freq: Two times a day (BID) | ORAL | Status: DC

## 2018-01-30 MED ORDER — HYDROXYCHLOROQUINE SULFATE 200 MG PO TABS
200.0000 mg | ORAL_TABLET | Freq: Two times a day (BID) | ORAL | Status: DC
  Filled 2018-01-30: qty 1

## 2018-01-30 MED ORDER — DEXTROSE 5 % IV SOLN
160.0000 mg | Freq: Three times a day (TID) | INTRAVENOUS | Status: DC
  Administered 2018-01-30 – 2018-01-31 (×3): 160 mg via INTRAVENOUS
  Filled 2018-01-30 (×2): qty 16
  Filled 2018-01-30 (×2): qty 10
  Filled 2018-01-30: qty 16

## 2018-01-30 NOTE — Anesthesia Postprocedure Evaluation (Signed)
Anesthesia Post Note  Patient: April Mueller  Procedure(s) Performed: MITRAL VALVE REPAIR (N/A )     Patient location during evaluation: ICU Anesthesia Type: General Level of consciousness: sedated Pain management: pain level controlled Vital Signs Assessment: post-procedure vital signs reviewed and stable Respiratory status: patient remains intubated per anesthesia plan Cardiovascular status: stable Postop Assessment: no apparent nausea or vomiting Anesthetic complications: no    Last Vitals:  Vitals:   01/30/18 0745 01/30/18 0754  BP:  (!) 109/55  Pulse:  60  Resp:  20  Temp: 36.8 C   SpO2:  100%    Last Pain:  Vitals:   01/30/18 0745  TempSrc: Oral                 Nadia Torr

## 2018-01-30 NOTE — Progress Notes (Signed)
  Echocardiogram 2D Echocardiogram has been performed.  April Mueller L Androw 01/30/2018, 8:36 AM

## 2018-01-30 NOTE — Consult Note (Signed)
[place] Date: 01/29/2018 01/30/2018 [place] Agent Requesting Physician:  Nelda Marseille MD  Reason for Consult:  [id number] HPI:  72 year old female with past history including severe mitral regurgitation/tricuspid regurgitation, permanent atrial fibrillation not on anticoagulation, chronic moderate thrombocytopenia, heart failure with preserved ejection fraction followed by Dr. Aundra Dubin with advanced heart failure services, pulmonary hypertension, COPD on chronic oxygen therapy, history of type a aortic dissection with repair in 2005, history of recurrent gastrointestinal hemorrhage on anticoagulation, lupus, obstructive sleep apnea, rheumatoid arthritis.    She presented yesterday for elective repair of the mitral valve via percutaneous approach given high surgical risk.  This went well, was technically successful, but in the postoperative setting developed hypoxia and hypercapnic respiratory failure requiring intubation after failed BiPAP.  She developed hypotension thereafter placed on norepinephrine and milrinone.  This morning her respiratory status is markedly improved and she has been extubated, now using 1 L nasal cannula.  Since admission she has made about 1 L of urine, having received furosemide 80 mg yesterday afternoon and starting on higher dose 160 mg IV TID, first given just a few hours ago.  She has baseline significant chronic kidney disease with a labile serum creatinine ranging between 2.5-4.  Preoperative serum creatinine was 3.2 on 6/24 and she has been fairly tenuous with renal function when diuretic dosing was increased.  Today her creatinine is 3.9 with a potassium of 4.9, bicarbonate 25, BUN 71, sodium 136.  She has a Foley catheter in place.  2017 renal ultrasound had a 11 cm right kidney and 11.1 cm left kidney with increased echogenicity bilaterally consistent with medical renal disease, no structural abnormalities identified at that time.  Urine analysis on 6/24 had no  protein, no blood.  Pyuria was present, no recent urine culture.  Home medications include torsemide 80 mg daily, metolazone 2.5 mg as directed (currently held).  The patient is awake, alert, no acute distress.  She recounts that in 2005 she required transient about 3 months worth of dialysis after her aortic dissection.  She is very worried about her renal function.  She has 3-4+ pitting edema in the legs, including into the proximal thighs and coccygeal region.  Most recent CVP is 17, blood pressure is stable on 5 mcg of norepinephrine and 0.125 mcg of milrinone.    Creatinine (mg/dL)  Date Value  06/11/2017 2.2 (H)  03/11/2017 2.5 (H)  01/21/2017 2.3 (H)  12/24/2016 2.3 (H)  11/26/2016 2.3 (H)  10/29/2016 2.2 (H)  10/01/2016 2.1 (H)  08/27/2016 2.8 (H)  08/02/2016 2.0 (A)  07/09/2016 2.0 (H)  05/28/2016 2.2 (H)   Creatinine, Ser (mg/dL)  Date Value  01/30/2018 3.90 (H)  01/27/2018 3.24 (H)  01/24/2018 3.66 (H)  01/20/2018 4.13 (H)  01/13/2018 3.69 (H)  01/07/2018 3.02 (H)  11/29/2017 2.60 (H)  11/18/2017 2.74 (H)  11/12/2017 2.13 (H)  07/23/2016 2.76 (H)  ] I/Os: I/O last 3 completed shifts: In: 1937.3 [I.V.:1524.8; IV Piggyback:412.5] Out: 025 [Urine:980; Blood:15]   ROS Balance of 12 systems is negative w/ exceptions as above  PMH  Past Medical History:  Diagnosis Date  . Anemia 03/23/15   transfusion  . Aortic aneurysm ([place])   . CHF (congestive heart failure) ([place])   . Chronic lower back pain   . Chronic thoracic aortic dissection (HCC)    S/P repair ascending thoracic aortic dissection with chronic residual dissection involving transverse aortic arch and descending thoracic aorta  . CKD (chronic kidney disease) stage 3, GFR  30-59 ml/min ([place])    "on dialysis for 3 months after my aneurysm in 2005" (02/20/2016)  . COPD (chronic obstructive pulmonary disease) ([place])   . Gout   . History of blood transfusion "several"  . Hypertension   . Lupus ([place])   . On  home oxygen therapy    "3L; 24/7" (02/20/2016)  . OSA on CPAP   . Pulmonary HTN ([place])   . Rheumatoid arthritis(714.0) 11/06/2012  . S/P aortic dissection repair   . Tuberculosis    dormant carrier   PSH  Past Surgical History:  Procedure Laterality Date  . BREAST EXCISIONAL BIOPSY Right    benign  . BREAST LUMPECTOMY Right   . CARDIAC CATHETERIZATION N/A 10/24/2015   Procedure: Right Heart Cath;  Surgeon: Larey Dresser, MD;  Location: [place] CV LAB;  Service: Cardiovascular;  Laterality: N/A;  . CATARACT EXTRACTION W/ INTRAOCULAR LENS  IMPLANT, BILATERAL Bilateral 2016  . COLONOSCOPY N/A 08/19/2015   Procedure: COLONOSCOPY;  Surgeon: Jerene Bears, MD;  Location: [facility] ENDOSCOPY;  Service: Endoscopy;  Laterality: N/A;  . ENTEROSCOPY N/A 02/22/2016   Procedure: ENTEROSCOPY;  Surgeon: Milus Banister, MD;  Location: [place];  Service: Endoscopy;  Laterality: N/A;  . ESOPHAGOGASTRODUODENOSCOPY N/A 08/19/2015   Procedure: ESOPHAGOGASTRODUODENOSCOPY (EGD);  Surgeon: Jerene Bears, MD;  Location: [facility] ENDOSCOPY;  Service: Endoscopy;  Laterality: N/A;  patient scheduled, anesthesia aware of 1500 case, per [place].  08/18/15 DP  . GIVENS CAPSULE STUDY N/A 08/21/2015   Procedure: GIVENS CAPSULE STUDY;  Surgeon: Gatha Mayer, MD;  Location: [place];  Service: Endoscopy;  Laterality: N/A;  . HEMORRHOID SURGERY    . MITRAL VALVE REPAIR N/A 01/29/2018   Procedure: MITRAL VALVE REPAIR;  Surgeon: Sherren Mocha, MD;  Location: [place] CV LAB;  Service: Cardiovascular;  Laterality: N/A;  . REPAIR OF ACUTE ASCENDING THORACIC AORTIC DISSECTION  2005  . RIGHT HEART CATHETERIZATION N/A 08/18/2014   Procedure: RIGHT HEART CATH;  Surgeon: Jolaine Artist, MD;  Location: [facility] CATH LAB;  Service: Cardiovascular;  Laterality: N/A;  . TEE WITHOUT CARDIOVERSION N/A 01/05/2015   Procedure: TRANSESOPHAGEAL ECHOCARDIOGRAM (TEE);  Surgeon: Larey Dresser, MD;  Location: [place];  Service:  Cardiovascular;  Laterality: N/A;  . TEE WITHOUT CARDIOVERSION N/A 11/20/2017   Procedure: TRANSESOPHAGEAL ECHOCARDIOGRAM (TEE);  Surgeon: Larey Dresser, MD;  Location: [facility] ENDOSCOPY;  Service: Cardiovascular;  Laterality: N/A;  . WISDOM TOOTH EXTRACTION     FH  Family History  Problem Relation Age of Onset  . Heart attack Mother   . Prostate cancer Father   . Diabetes Brother   . Kidney failure Brother   . Breast cancer Maternal Aunt   . Colon cancer Neg Hx   . Stomach cancer Neg Hx    SH  reports that she quit smoking about 14 years ago. Her smoking use included cigarettes. She has a 21.00 pack-year smoking history. She has never used smokeless tobacco. She reports that she drinks alcohol. She reports that she does not use drugs. Allergies No Known Allergies Home medications Prior to Admission medications   Medication Sig Start Date End Date Taking? Authorizing Provider  allopurinol (ZYLOPRIM) 300 MG tablet Take 300 mg by mouth daily.   Yes [provider]  ALPRAZolam (XANAX) 0.25 MG tablet Take 1 tablet (0.25 mg total) by mouth every 12 (twelve) hours as needed for anxiety or sleep. Patient taking differently: Take 0.25 mg by mouth at bedtime.  07/24/16  Yes Eubanks,  Carlos American, NP  atorvastatin (LIPITOR) 10 MG tablet Take 1 tablet (10 mg total) by mouth daily. 10/28/17  Yes Larey Dresser, MD  Biotin 5000 MCG CAPS Take 5,000 mcg by mouth daily.    Yes [provider]  hydroxychloroquine (PLAQUENIL) 200 MG tablet Take 200 mg by mouth 2 (two) times daily.   Yes [provider]  metoprolol succinate (TOPROL-XL) 25 MG 24 hr tablet Take 1 tablet (25 mg total) by mouth 2 (two) times daily. 10/28/17  Yes Larey Dresser, MD  Omega-3 Fatty Acids (FISH OIL) 1200 MG CAPS Take 1,200 mg by mouth daily.   Yes [provider]  OXYGEN Inhale 3 L into the lungs continuous.    Yes [provider]  Polyethylene Glycol 400 (BLINK TEARS OP) Place 1-2 drops  into both eyes at bedtime.   Yes [provider]  potassium chloride SA (K-DUR,KLOR-CON) 20 MEQ tablet Take 40 mEq by mouth 2 (two) times daily.   Yes [provider]  torsemide (DEMADEX) 20 MG tablet Take 4 tablets (80 mg total) by mouth daily. 01/22/18  Yes Larey Dresser, MD  acetaminophen (TYLENOL) 500 MG tablet Take 1,000 mg by mouth every 6 (six) hours as needed for moderate pain or headache.    [provider]  metolazone (ZAROXOLYN) 2.5 MG tablet Take 1 tablet (2.5 mg total) by mouth once a week. Take Every Saturday. 01/17/18   Larey Dresser, MD  potassium chloride (K-DUR,KLOR-CON) 10 MEQ tablet Take 2 tablets (20 mEq total) by mouth 2 (two) times daily. Take an extra tab on Saturday and Wednesday when you take Metolazone Patient not taking: Reported on 01/21/2018 01/07/18   Larey Dresser, MD  Tiotropium Bromide-Olodaterol (STIOLTO RESPIMAT) 2.5-2.5 MCG/ACT AERS Inhale 2 puffs into the lungs daily. Patient not taking: Reported on 01/21/2018 12/17/17   Collene Gobble, MD    Current Medications Scheduled Meds: . aspirin  81 mg Oral Daily  . atorvastatin  10 mg Oral Daily  . hydroxychloroquine  200 mg Oral BID  . polyvinyl alcohol  1 drop Both Eyes QHS  . potassium chloride  40 mEq Oral BID  . sodium chloride flush  3 mL Intravenous Q12H   Continuous Infusions: . sodium chloride 10 mL/hr at 01/30/18 0834  . sodium chloride    . furosemide 160 mg (01/30/18 1151)  . milrinone 0.125 mcg/kg/min (01/30/18 0800)  . norepinephrine (LEVOPHED) Adult infusion 5 mcg/min (01/30/18 1138)   PRN Meds:.sodium chloride, acetaminophen, levalbuterol, ondansetron (ZOFRAN) IV, sodium chloride flush  CBC Recent Labs  Lab 01/27/18 0953 01/30/18 0420 01/30/18 1202  WBC 7.1 9.6 10.3  HGB 10.0* 8.6* 8.7*  HCT 32.8* 28.5* 28.7*  MCV 97.0 96.6 99.3  PLT 48* 55* 75*   Basic Metabolic Panel Recent Labs  Lab 01/24/18 1418 01/27/18 0953 01/30/18 0420  NA 143 141 136   K 4.0 3.7 4.9  CL 102 101 101  CO2 30 26 25   GLUCOSE 106* 114* 187*  BUN 75* 58* 71*  CREATININE 3.66* 3.24* 3.90*  CALCIUM 10.0 10.1 9.4    Physical Exam  Blood pressure 96/65, pulse 72, temperature 98.4 F (36.9 C), temperature source Oral, resp. rate 18, height 5\' 4"  (1.626 m), SpO2 94 %. GEN: No acute distress, lying in bed ENT: Nasal cannula in place, NCAT EYES: EOMI CV: Irregular rhythm, normal rate, hyperdynamic on palpation, systolic murmur present PULM: Diminished in the bases, clear movement ABD: Soft, nontender SKIN: No rashes or  lesions EXT: 3+ pitting edema into the proximal legs   Assessment 63F with advanced CKD, BL SCr in high 2s and recent worsening likely from her valvular heart disease/ CHF s/p percutaneous MV repair, acute respiratory failure, hypotension on NE and milrinone, and AoCKD4.  1. AoCKD4 after MV repair, hypoxic/hypercapneic RF, hypotension; likely cardiorenal causing subacute and more recent changes.  Hx/o dialysis dependence 2005 x56mo after aortic dissection 2. S/p percutaneous MV repair 6/26 3. COPD, chronic hypoxia, s/p intubation 6/26-6/27 4. Hypervolemia / HFPEF 5. AFib not on AC 6. Chronic TCP  Plan 1. Not sure how much of an acute component she has at the current time; certainly over the past several months has had progressive loss of kidney failure likely related to her cardiorenal disease.  She is hypervolemic at the current time and high-dose diuretics just initiated today, too soon to see if responding adequately or not. 2. Would not make any changes to furosemide currently but will follow closely 3. I don't think we need another renal US 4. Daily weights, Daily Renal Panel, Strict I/Os, Avoid nephrotoxins (NSAIDs, judicious IV Contrast)    Pearson Grippe MD [phone] pgr 01/30/2018, 1:31 PM

## 2018-01-30 NOTE — Progress Notes (Signed)
PULMONARY / CRITICAL CARE MEDICINE   Name: April Mueller MRN: [id number] DOB: Jan 17, 1946    ADMISSION DATE:  01/29/2018 CONSULTATION DATE:  6/26  REFERRING MD:  Dr. 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 She is followed by Dr. Burt Knack and presented to [facility] for elective percutaneous mitral valve repair for symptom palliation (she had been deemed not a candidate for open procedure). The procedure was without complication, but the postoperative course was complicated by hypoxia and lethargy. She was started on BiPAP. ABG was drawn and demonstrated profound respiratory acidosis. She was emergently intubated by anesthesia and was transferred to ICU for further care. PCCM consulted.   PAST MEDICAL HISTORY :  She  has a past medical history of Anemia (03/23/15), Anxiety, Aortic aneurysm ([place]), Arthritis, CHF (congestive heart failure) ([place]), Chronic lower back pain, Chronic thoracic aortic dissection ([place]), CKD (chronic kidney disease) stage 3, GFR 30-59 ml/min (HCC), COPD (chronic obstructive pulmonary disease) ([place]), Gout, Heart murmur, History of blood transfusion ("several"), Hypertension, Lupus ([place]), On home oxygen therapy, OSA on CPAP, Pulmonary HTN ([place]), Rheumatoid arthritis(714.0) (11/06/2012), S/P aortic dissection repair, and Tuberculosis.   SUBJECTIVE:  Extubated this am 6/27 On 3L [place] Awake and alert, oriented On 6 mcg Levophed, milrinone infusions  VITAL SIGNS: BP (!) 101/55   Pulse 69   Temp 98.4 F (36.9 C) (Oral)   Resp 18   Ht 5\' 4"  (1.626 m)   SpO2 100%   BMI 29.11 kg/m   HEMODYNAMICS: CVP:  [18 mmHg-19 mmHg] 18 mmHg  VENTILATOR SETTINGS: Vent Mode: PSV;CPAP FiO2 (%):  [30 %-100 %] 30 % Set  Rate:  [20 bmp] 20 bmp Vt Set:  [450 mL] 450 mL PEEP:  [5 cmH20] 5 cmH20 Pressure Support:  [5 cmH20] 5 cmH20 Plateau Pressure:  [18 cmH20-20 cmH20] 18 cmH20  INTAKE / OUTPUT: I/O last 3 completed shifts: In: 1937.3 [I.V.:1524.8; IV Piggyback:412.5] Out: 995 [Urine:980; Blood:15]  PHYSICAL EXAMINATION: General:  Chronically ill appearing female, laying in bed, on 3L [place], in no acute distress Neuro:  Sleeping, arouses to voice, Alert and oriented x4, follows simple commands, no focal deficits  HEENT:  Atraumatic, normocephalic, PERRLA Cardiovascular: Irregularly irregular rhythm, 4/6 murmur, 2+ BLE pitting edema   Lungs:  Diminished lung sounds, even, non-labored, no assessory muscle use Abdomen:  Soft, non-tender, non-distended, BS hypoactive Musculoskeletal:  No deformities, normal bulk and tone Skin:  Warm, dry.  No obvious rashes, lesions, or ulerations  LABS:  BMET Recent Labs  Lab 01/24/18 1418 01/27/18 0953 01/30/18 0420  NA 143 141 136  K 4.0 3.7 4.9  CL 102 101 101  CO2 30 26 25   BUN 75* 58* 71*  CREATININE 3.66* 3.24* 3.90*  GLUCOSE 106* 114* 187*    Electrolytes Recent Labs  Lab 01/24/18 1418 01/27/18 0953 01/30/18 0420  CALCIUM 10.0 10.1 9.4    CBC Recent Labs  Lab 01/27/18 0953 01/30/18 0420  WBC 7.1 9.6  HGB 10.0* 8.6*  HCT 32.8* 28.5*  PLT 48* 55*    Coag's Recent Labs  Lab 01/27/18 0953  APTT 33  INR 1.32    Sepsis Markers No results for input(s): LATICACIDVEN, PROCALCITON, O2SATVEN in the last 168 hours.  ABG Recent Labs  Lab 01/27/18 0953 01/29/18 1549 01/29/18 1700  PHART 7.526* 7.163* 7.310*  PCO2ART 37.3 83.9* 53.8*  PO2ART 77.9* 360.0* 387*    Liver Enzymes Recent Labs  Lab 01/27/18 0953  AST 34  ALT 22  ALKPHOS 101  BILITOT 1.4*  ALBUMIN 3.4*    Cardiac Enzymes No results for input(s): TROPONINI, PROBNP in the last 168 hours.  Glucose Recent Labs  Lab 01/29/18 2257 01/30/18 0637 01/30/18 1053   GLUCAP 138* 165* 149*    Imaging Dg Chest Portable 1 View  Result Date: 01/30/2018 CLINICAL DATA:  Check endotracheal tube placement EXAM: PORTABLE CHEST 1 VIEW COMPARISON:  01/29/2018 FINDINGS: Cardiac shadow is enlarged. Changes consistent with recent mitral valve repair are noted. Endotracheal tube and nasogastric catheter are noted in satisfactory position. Left jugular central line is noted with the tip in the proximal superior vena cava. No pneumothorax is noted. Left atrial enlargement is again noted. Small right pleural effusion is noted. IMPRESSION: Tubes and lines as described. Stable small right pleural effusion. Electronically Signed   By: Inez Catalina M.D.   On: 01/30/2018 09:19   Dg Chest Port 1 View  Result Date: 01/29/2018 CLINICAL DATA:  Hypoxemia. Hx of aortic aneurysm, CHF, chronic thoracic aortic dissection, COPD, heart murmur, hypertension, pulmonary HTN, cardiac catheterization, aortic dissection repair(2005). Former smoker(2005). EXAM: PORTABLE CHEST 1 VIEW COMPARISON:  01/27/2018 FINDINGS: Interval intubation with the endotracheal tube 5 cm from carina. LEFT central venous line with tip projecting over the Bibasilar atelectasis and small RIGHT effusion. Mild central venous pulmonary congestion. No pneumothorax. Distal brachiocephalic vein. IMPRESSION: 1. Endotracheal tube in good position. 2. LEFT central venous line with tip projecting over the distal brachiocephalic vein. 3. No pneumothorax. 4. Increased RIGHT pleural effusion Electronically Signed   By: Suzy Bouchard M.D.   On: 01/29/2018 16:47     STUDIES:  TEE 4/17 > LVEF 55-60% Echo 6/26 >  TEE 6/26 >   CULTURES:   ANTIBIOTICS:   SIGNIFICANT EVENTS: 6/26 mitral valve repair, post op lethargy. Intubated.   LINES/TUBES: ETT 6/26 >6/27 CVL 6/26 >  DISCUSSION: 72 year old female with cardiac history who presented for elective mitral valve repair 6/26. Postoperative course complicated by lethargy,  hypoxia, and hypercarbia causing acidosis. She was intubated and transferred to ICU.    ASSESSMENT / PLAN:  PULMONARY A: Acute on chronic mixed respiratory failure (on home O2) COPD without acute exacerbation OSA on CPAP  P:   Extubated 6/27 Supplemental O2 to maintain O2 sats 88 to 92% Scheduled and PRN nebs  CARDIOVASCULAR A:  Severe MR now s/p Mitral valve repair 9/89 Chronic diastolic CHF Severe TR Hypotension s/p intubation. At risk cardiogenic shock  P:  Cardiology following (primary service) Heart failure service following Maintain MAP >65 Levophed as needed to maintain MAP goal, wean as tolerated Continue Milrinone per Cardiology and Heart Failure service  RENAL A:   CKD: serum creatinine at baseline  P:   Monitor I&O's / urine output Trend BMP Ensure adequate renal perfusion Avoid Nephrotoxic agents as able Replace electrolytes as indicated  GASTROINTESTINAL A:   No acute issues  P:   NPO for now, then bedside swallow evaluation 4 hrs post extubation D/c Protonix (SUP)  HEMATOLOGIC A:   Thrombocytopenia Anemia  P:  Monitor for s/sx of bleeding Trend CBC Transfuse for Hgb<7 Transfuse PLT for PLT <50 and bleeding  INFECTIOUS A:   No acute issues  P:   Monitor fever curve Trend WBC's  ENDOCRINE A:  Hyperglycemia  P:   CBG's SSI  NEUROLOGIC A:   No active issues  P:   Provide supportive care Avoid sedating meds as able Lights on during the day   FAMILY  - Updates: Updated pt at bedside 01/30/18.  - Inter-disciplinary family meet or Palliative Care meeting due by:  7/3   Darel Hong, AGACNP-BC [facility] 01/30/2018 11:29 AM  Attending Note:  72 year old female with extensive cardiac and renal failure.  Patient underwent a cardiac cath and was intubated after for CO2 retention.  On exam, patient is alert and interactive, moving all ext to commands and is weaning.  I reviewed CXR  myself, ETT is in good position with some pulmonary edema.  Discussed with PCCM-NP.  Will proceed with extubation today.  Will need to communicate with renal.  Lasix as ordered.  PT/OT evaluation.  OOB to chair.  PCCM will continue to follow.  The patient is critically ill with multiple organ systems failure and requires high complexity decision making for assessment and support, frequent evaluation and titration of therapies, application of advanced monitoring technologies and extensive interpretation of multiple databases.   Critical Care Time devoted to patient care services described in this note is  33  Minutes. This time reflects time of care of this signee Dr Jennet Maduro. This critical care time does not reflect procedure time, or teaching time or supervisory time of PA/NP/Med student/Med Resident etc but could involve care discussion time.  Rush Farmer, M.D. [facility] Pulmonary/Critical Care Medicine. Pager: [phone]. After hours pager: [phone].

## 2018-01-30 NOTE — Progress Notes (Signed)
Progress Note  Patient Name: April Mueller Date of Encounter: 01/30/2018  Primary Cardiologist: No primary care provider on file.   Subjective   Patient intubated but wide awake.  She is gesturing and communicating by writing.  Asking appropriate questions.  Denies pain.  Inpatient Medications    Scheduled Meds: . aspirin  81 mg Oral Daily  . atorvastatin  10 mg Oral Daily  . chlorhexidine gluconate (MEDLINE KIT)  15 mL Mouth Rinse BID  . hydroxychloroquine  200 mg Per Tube BID  . ipratropium  0.5 mg Nebulization Q6H  . levalbuterol  0.63 mg Nebulization Q6H  . mouth rinse  15 mL Mouth Rinse 10 times per day  . polyvinyl alcohol  1 drop Both Eyes QHS  . potassium chloride  40 mEq Per Tube BID  . sodium chloride flush  3 mL Intravenous Q12H  . torsemide  80 mg Oral Daily   Continuous Infusions: . sodium chloride 10 mL/hr at 01/30/18 0834  . sodium chloride    . milrinone 0.125 mcg/kg/min (01/30/18 0800)  . norepinephrine (LEVOPHED) Adult infusion 6 mcg/min (01/30/18 0857)   PRN Meds: sodium chloride, acetaminophen, fentaNYL (SUBLIMAZE) injection, levalbuterol, midazolam, midazolam, ondansetron (ZOFRAN) IV, sodium chloride flush   Vital Signs    Vitals:   01/30/18 0745 01/30/18 0754 01/30/18 0800 01/30/18 0830  BP:  (!) 109/55 112/61   Pulse:  60 62 63  Resp:  20 (!) 21 18  Temp: 98.2 F (36.8 C)     TempSrc: Oral     SpO2:  100% 99% 99%  Height:        Intake/Output Summary (Last 24 hours) at 01/30/2018 4742 Last data filed at 01/30/2018 0800 Gross per 24 hour  Intake 2032.01 ml  Output 995 ml  Net 1037.01 ml   There were no vitals filed for this visit.  Telemetry    Atrial fibrillation heart rate 60s- Personally Reviewed  ECG    Atrial fibrillation, heart rate 60 bpm- Personally Reviewed  Physical Exam  Intubated, awake and alert, following commands GEN: No acute distress.   Neck:  JVP elevated Cardiac:  Irregularly irregular with grade 2/6  early systolic ejection murmur at the right upper sternal border Respiratory: Clear to auscultation bilaterally anteriorly. GI: Soft, nontender, non-distended  MS:  1+ bilateral pretibial edema; No deformity. Neuro:  Nonfocal   Labs    Chemistry Recent Labs  Lab 01/24/18 1418 01/27/18 0953 01/30/18 0420  NA 143 141 136  K 4.0 3.7 4.9  CL 102 101 101  CO2 30 26 25   GLUCOSE 106* 114* 187*  BUN 75* 58* 71*  CREATININE 3.66* 3.24* 3.90*  CALCIUM 10.0 10.1 9.4  PROT  --  7.8  --   ALBUMIN  --  3.4*  --   AST  --  34  --   ALT  --  22  --   ALKPHOS  --  101  --   BILITOT  --  1.4*  --   GFRNONAA 12* 13* 11*  GFRAA 13* 16* 12*  ANIONGAP 11 14 10      Hematology Recent Labs  Lab 01/27/18 0953 01/30/18 0420  WBC 7.1 9.6  RBC 3.38* 2.95*  HGB 10.0* 8.6*  HCT 32.8* 28.5*  MCV 97.0 96.6  MCH 29.6 29.2  MCHC 30.5 30.2  RDW 15.2 14.7  PLT 48* 55*    Cardiac EnzymesNo results for input(s): TROPONINI in the last 168 hours. No results for input(s): TROPIPOC in  the last 168 hours.   BNP Recent Labs  Lab 01/27/18 0953  BNP 732.8*     DDimer No results for input(s): DDIMER in the last 168 hours.   Radiology    Dg Chest Port 1 View  Result Date: 01/29/2018 CLINICAL DATA:  Hypoxemia. Hx of aortic aneurysm, CHF, chronic thoracic aortic dissection, COPD, heart murmur, hypertension, pulmonary HTN, cardiac catheterization, aortic dissection repair(2005). Former smoker(2005). EXAM: PORTABLE CHEST 1 VIEW COMPARISON:  01/27/2018 FINDINGS: Interval intubation with the endotracheal tube 5 cm from carina. LEFT central venous line with tip projecting over the Bibasilar atelectasis and small RIGHT effusion. Mild central venous pulmonary congestion. No pneumothorax. Distal brachiocephalic vein. IMPRESSION: 1. Endotracheal tube in good position. 2. LEFT central venous line with tip projecting over the distal brachiocephalic vein. 3. No pneumothorax. 4. Increased RIGHT pleural effusion  Electronically Signed   By: Suzy Bouchard M.D.   On: 01/29/2018 16:47    Cardiac Studies   2D echo from this morning: Formal interpretation pending.  I have reviewed images which demonstrate dyssynchronous LV contractility but preserved LV systolic function, normal RV function, severe tricuspid regurgitation, intact mitral valve repair with mean transmitral gradient of 4 to 5 mmHg and trivial mitral regurgitation, estimated RV systolic pressure 43 mmHg, massive biatrial dilatation  Patient Profile     72 y.o. female with chronic respiratory failure on home O2 with severe mitral regurgitation presenting for percutaneous mitral valve repair with MitraClip 01/29/2018  Assessment & Plan    1.  Severe mitral regurgitation, stage D, with New York Heart Association functional class IV acute on chronic diastolic heart failure: Good technical result with MitraClip yesterday with only trivial residual mitral regurgitation and acceptable transmitral gradients.  Acute respiratory failure during early postoperative period requiring reintubation.  Continue low-dose aspirin alone in the setting of chronic thrombocytopenia.  I do not think she is a candidate for dual antiplatelet therapy.  She is clearly not a candidate for long-term anticoagulation as extensively outlined with her past bleeding history and thrombocytopenia.  2.  Acute respiratory failure (acute on chronic): Patient on home O2 with severe COPD and FEV1 of 0.7, as well as severe diastolic heart failure.  Appreciate management of CCM and the advanced heart failure team.  The patient looks good on the ventilator today with an FiO2 of 0.4.  I am hopeful that she can be extubated in the near future.  3.  Acute on chronic diastolic heart failure: Hemodynamically stable on low doses of milrinone and norepinephrine.  Concerned about low urine output.  Co-oximetry this morning is 82, CVP 18.  Will give furosemide 160 mg IV every 8 hours.  Further  management per the advanced heart failure team.  4.  Atrial fibrillation, permanent: Stable rhythm.  Not a candidate for anticoagulation  5.  Acute on chronic renal failure: Poor urine output noted.  Rising creatinine had occurred with diuresis as an outpatient.  She did respond well to metolazone as an outpatient but her creatinine increased and diuretic dosing had to be reduced.  She had subsequently presented with worsening edema.  Very tenuous balance.  We will see how she responds to high-dose diuretics and if continues to decline we will ask nephrology to see her.  6.  Thrombocytopenia: Stable without active bleeding.  7.  Anemia: Likely combination of chronic disease and phlebotomy.  No significant periprocedural blood loss.  Will follow with hemoglobin 8.6 this morning.  For questions or updates, please contact [place] Please  consult [email or web address] for contact info under Cardiology/STEMI.      Signed, Sherren Mocha, MD  01/30/2018, 9:07 AM

## 2018-01-30 NOTE — Progress Notes (Signed)
Pt in afib bradying down into low 30's but not sustaining. Pt alert when RN enters the room to check on pt. Otherwise asymptomatic. BPs stable. RN relayed concerns to MD. RN to call MD if pt becomes more symptomatic. Will continue to monitor pt.

## 2018-01-30 NOTE — Progress Notes (Addendum)
Advanced Heart Failure Rounding Note  PCP-Cardiologist: No primary care provider on file.   Subjective:    Extubated this am. Remains on milrinone 0.125 mcg/kg/min and norepinephrine 5 mcg/min. Co-ox 82%.   Sluggish UOP this am. IV lasix increased to 160 mg TID today. Creatinine trending up. 3.24 > 3.9 this am. Nephro consulted.  Denies CP, SOB. Happy to be extubated. Weak. No orthopnea or PND. Weight up about 11-12 pounds from baseline   Objective:   Weight Range: 175 lb 12.8 oz (79.7 kg) Body mass index is 30.18 kg/m.   Vital Signs:   Temp:  [97.7 F (36.5 C)-98.5 F (36.9 C)] 98.5 F (36.9 C) (06/27 1623) Pulse Rate:  [45-156] 60 (06/27 1500) Resp:  [13-28] 25 (06/27 1500) BP: (95-128)/(50-75) 100/54 (06/27 1500) SpO2:  [92 %-100 %] 96 % (06/27 1500) FiO2 (%):  [30 %-40 %] 30 % (06/27 0926) Weight:  [175 lb 12.8 oz (79.7 kg)] 175 lb 12.8 oz (79.7 kg) (06/27 1623) Last BM Date: 01/29/18  Weight change: Filed Weights   01/30/18 1623  Weight: 175 lb 12.8 oz (79.7 kg)    Intake/Output:   Intake/Output Summary (Last 24 hours) at 01/30/2018 1645 Last data filed at 01/30/2018 1600 Gross per 24 hour  Intake 1080.7 ml  Output 920 ml  Net 160.7 ml      Physical Exam    CVP 18 General:  Elderly sitting up in bed No resp difficulty HEENT: Normal Neck: Supple. JVP to jaw. Carotids 2+ bilat; no bruits. No lymphadenopathy or thyromegaly appreciated. Cor: PMI nondisplaced. Irregular rhythm. 2/6 TR Left IJ TLC Lungs: 1 L [place] Decreased BS throughout mild basilar crackles Abdomen: obese, soft, nontender, nondistended. No hepatosplenomegaly. No bruits or masses. Good bowel sounds. Extremities: No cyanosis, clubbing, rash, BLE 2+ edema Neuro: alert & oriented x 3, cranial nerves grossly intact. moves all 4 extremities w/o difficulty. Affect pleasant   Telemetry   Afib 60-70s. Personally reviewed.   EKG    No new tracings.   Labs    CBC Recent Labs     01/30/18 0420 01/30/18 1202  WBC 9.6 10.3  HGB 8.6* 8.7*  HCT 28.5* 28.7*  MCV 96.6 99.3  PLT 55* 75*   Basic Metabolic Panel Recent Labs    01/30/18 0420  NA 136  K 4.9  CL 101  CO2 25  GLUCOSE 187*  BUN 71*  CREATININE 3.90*  CALCIUM 9.4   Liver Function Tests No results for input(s): AST, ALT, ALKPHOS, BILITOT, PROT, ALBUMIN in the last 72 hours. No results for input(s): LIPASE, AMYLASE in the last 72 hours. Cardiac Enzymes No results for input(s): CKTOTAL, CKMB, CKMBINDEX, TROPONINI in the last 72 hours.  BNP: BNP (last 3 results) Recent Labs    01/27/18 0953  BNP 732.8*    ProBNP (last 3 results) No results for input(s): PROBNP in the last 8760 hours.   D-Dimer No results for input(s): DDIMER in the last 72 hours. Hemoglobin A1C No results for input(s): HGBA1C in the last 72 hours. Fasting Lipid Panel No results for input(s): CHOL, HDL, LDLCALC, TRIG, CHOLHDL, LDLDIRECT in the last 72 hours. Thyroid Function Tests No results for input(s): TSH, T4TOTAL, T3FREE, THYROIDAB in the last 72 hours.  Invalid input(s): FREET3  Other results:   Imaging    Dg Chest Portable 1 View  Result Date: 01/30/2018 CLINICAL DATA:  Check endotracheal tube placement EXAM: PORTABLE CHEST 1 VIEW COMPARISON:  01/29/2018 FINDINGS: Cardiac shadow is enlarged. Changes  consistent with recent mitral valve repair are noted. Endotracheal tube and nasogastric catheter are noted in satisfactory position. Left jugular central line is noted with the tip in the proximal superior vena cava. No pneumothorax is noted. Left atrial enlargement is again noted. Small right pleural effusion is noted. IMPRESSION: Tubes and lines as described. Stable small right pleural effusion. Electronically Signed   By: Inez Catalina M.D.   On: 01/30/2018 09:19      Medications:     Scheduled Medications: . aspirin  81 mg Oral Daily  . atorvastatin  10 mg Oral Daily  . hydroxychloroquine  200 mg Oral  BID  . polyvinyl alcohol  1 drop Both Eyes QHS  . potassium chloride  40 mEq Oral BID  . sodium chloride flush  3 mL Intravenous Q12H     Infusions: . sodium chloride 10 mL/hr at 01/30/18 1500  . sodium chloride    . furosemide Stopped (01/30/18 1248)  . milrinone 0.125 mcg/kg/min (01/30/18 1500)  . norepinephrine (LEVOPHED) Adult infusion 5 mcg/min (01/30/18 1500)     PRN Medications:  sodium chloride, acetaminophen, levalbuterol, ondansetron (ZOFRAN) IV, sodium chloride flush    Patient Profile   April Mueller is a34 y.o.with COPD on home oxygen, rheumatoid arthritis, chronic atrial fibrillation, chronic diastolic CHF, Type A aortic dissection 2005,  GI bleed, anemia, CKD, OSA, and MR.    Admitted for mitral clip.  Assessment/Plan   1. Severe MR, S/P Mitral Clip Repair Complicated by respiratory failure  - Echo today showed mild MR with mean gradient 5 mmHg  2. Acute Hypoxic Respiratory Failure  Urgent intubation 01/29/2018, extubated today - Doing well on 1 L [place]  3. Acute/Diastolic Heart Failure Volume status elevated. Flash pulmonary edema.  - Echo 6/27: EF 60-65%, mild AS, mild MR, RA and LA atria severly dilated, severe TR, PA peak pressure 60 mmHg - Continue milrinone 0.125 and norepinephrine. - Co-ox 82%. - Continue lasix 160 mg TID  4. Chronic A fib  Rate controlled 60-70s She has not been on anticoagulants with history of GI Bleed.  5. 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 No change.   6.AKI on CKD stage IV  Creatinine baseline ~3  - Creatinine 3.90 this am.  - Nephrology now following.   7. H/O GI bleed  Extensive GI work up without evidence of bleeding.  - Hemoglobin 8.6 this am.    Length of Stay: [place], NP  01/30/2018, 4:45 PM  Advanced Heart Failure Team Pager [phone] (M-F; [place])  Please contact [facility] for night-coverage after hours (4p -7a ) and weekends on  [email or web address]  Patient seen and examined with the above-signed Advanced Practice Provider and/or Housestaff. I personally reviewed laboratory data, imaging studies and relevant notes. I independently examined the patient and formulated the important aspects of the plan. I have edited the note to reflect any of my changes or salient points. I have personally discussed the plan with the patient and/or family.  She is POD#1 MitraClip. Course complicated by volume overload, AKI and hypercapneic respiratory failure. Now extubated. Urine output sluggish. Lasix increase to 160 IV tid. Will watch for response. Continue milrinone. Co-ox ok. Chronic AF stable and rate controlled. Not on [facility] due to h/o GIB. D/w Dr. Burt Knack.   Glori Bickers, MD  7:00 PM

## 2018-01-30 NOTE — Procedures (Signed)
Extubation Procedure Note  Patient Details:   Name: April Mueller DOB: 02/02/1946 MRN: [id number]   Airway Documentation:    Vent end date: 01/30/18 Vent end time: 1100   Evaluation  O2 sats: stable throughout Complications: No apparent complications Patient did tolerate procedure well. Bilateral Breath Sounds: Diminished(coarse)   Yes   Patient extubated to 3L House without complications. No stridor noted.   Herbie [place] 01/30/2018, 11:16 AM

## 2018-01-31 DIAGNOSIS — N179 Acute kidney failure, unspecified: Secondary | ICD-10-CM

## 2018-01-31 DIAGNOSIS — J9601 Acute respiratory failure with hypoxia: Secondary | ICD-10-CM

## 2018-01-31 DIAGNOSIS — I482 Chronic atrial fibrillation, unspecified: Secondary | ICD-10-CM

## 2018-01-31 DIAGNOSIS — R0902 Hypoxemia: Secondary | ICD-10-CM

## 2018-01-31 LAB — CBC
HCT: 27 % — ABNORMAL LOW (ref 36.0–46.0)
Hemoglobin: 8.1 g/dL — ABNORMAL LOW (ref 12.0–15.0)
MCH: 29.3 pg (ref 26.0–34.0)
MCHC: 30 g/dL (ref 30.0–36.0)
MCV: 97.8 fL (ref 78.0–100.0)
PLATELETS: 50 10*3/uL — AB (ref 150–400)
RBC: 2.76 MIL/uL — ABNORMAL LOW (ref 3.87–5.11)
RDW: 15 % (ref 11.5–15.5)
WBC: 10.9 10*3/uL — AB (ref 4.0–10.5)

## 2018-01-31 LAB — BASIC METABOLIC PANEL
ANION GAP: 10 (ref 5–15)
Anion gap: 11 (ref 5–15)
BUN: 81 mg/dL — ABNORMAL HIGH (ref 8–23)
BUN: 86 mg/dL — AB (ref 8–23)
CALCIUM: 9.1 mg/dL (ref 8.9–10.3)
CO2: 27 mmol/L (ref 22–32)
CO2: 27 mmol/L (ref 22–32)
CREATININE: 4.21 mg/dL — AB (ref 0.44–1.00)
Calcium: 9.2 mg/dL (ref 8.9–10.3)
Chloride: 100 mmol/L (ref 98–111)
Chloride: 99 mmol/L (ref 98–111)
Creatinine, Ser: 4.12 mg/dL — ABNORMAL HIGH (ref 0.44–1.00)
GFR calc Af Amer: 12 mL/min — ABNORMAL LOW (ref >60)
GFR calc Af Amer: 11 mL/min — ABNORMAL LOW (ref >60)
GFR calc non Af Amer: 10 mL/min — ABNORMAL LOW (ref >60)
GFR, EST NON AFRICAN AMERICAN: 10 mL/min — AB (ref >60)
GLUCOSE: 159 mg/dL — AB (ref 70–99)
GLUCOSE: 123 mg/dL — AB (ref 70–99)
POTASSIUM: 4.6 mmol/L (ref 3.5–5.1)
Potassium: 4.3 mmol/L (ref 3.5–5.1)
Sodium: 137 mmol/L (ref 135–145)
Sodium: 137 mmol/L (ref 135–145)

## 2018-01-31 LAB — COOXEMETRY PANEL
CARBOXYHEMOGLOBIN: 1.3 % (ref 0.5–1.5)
Methemoglobin: 1.9 % — ABNORMAL HIGH (ref 0.0–1.5)
O2 Saturation: 80.5 %
TOTAL HEMOGLOBIN: 8.3 g/dL — AB (ref 12.0–16.0)

## 2018-01-31 MED ORDER — ENSURE ENLIVE PO LIQD
237.0000 mL | Freq: Two times a day (BID) | ORAL | Status: DC
  Administered 2018-02-02 – 2018-02-05 (×8): 237 mL via ORAL

## 2018-01-31 MED ORDER — ALPRAZOLAM 0.25 MG PO TABS
0.2500 mg | ORAL_TABLET | Freq: Every day | ORAL | Status: DC
  Administered 2018-02-01 – 2018-02-06 (×7): 0.25 mg via ORAL
  Filled 2018-01-31 (×7): qty 1

## 2018-01-31 MED ORDER — DEXTROSE 5 % IV SOLN
160.0000 mg | Freq: Two times a day (BID) | INTRAVENOUS | Status: DC
  Administered 2018-01-31: 160 mg via INTRAVENOUS
  Filled 2018-01-31 (×2): qty 16

## 2018-01-31 NOTE — Progress Notes (Addendum)
Progress Note  Patient Name: April Mueller Date of Encounter: 01/31/2018  Primary Cardiologist: No primary care provider on file.   Subjective   Feels weak. Walked to door this am with PT. C/o sore throat from ET tube. No pain or other c/o.   Inpatient Medications    Scheduled Meds: . aspirin  81 mg Oral Daily  . atorvastatin  10 mg Oral Daily  . polyvinyl alcohol  1 drop Both Eyes QHS  . sodium chloride flush  3 mL Intravenous Q12H   Continuous Infusions: . sodium chloride 10 mL/hr at 01/31/18 0600  . sodium chloride    . furosemide Stopped (01/31/18 0501)  . milrinone 0.125 mcg/kg/min (01/31/18 0600)  . norepinephrine (LEVOPHED) Adult infusion 5 mcg/min (01/31/18 0600)   PRN Meds: sodium chloride, acetaminophen, levalbuterol, ondansetron (ZOFRAN) IV, sodium chloride flush   Vital Signs    Vitals:   01/31/18 0300 01/31/18 0400 01/31/18 0500 01/31/18 0600  BP: 107/61 110/63 (!) 105/59   Pulse: 65 70 71 62  Resp: (!) 21 (!) 23 (!) 25 (!) 24  Temp:      TempSrc:      SpO2: 95% 96% 95% (!) 88%  Weight:      Height:        Intake/Output Summary (Last 24 hours) at 01/31/2018 0651 Last data filed at 01/31/2018 0600 Gross per 24 hour  Intake 1203.05 ml  Output 1130 ml  Net 73.05 ml   Filed Weights   01/30/18 1623  Weight: 175 lb 12.8 oz (79.7 kg)    Telemetry    Atrial fibrillation, rate well-controlled in 60's - Personally Reviewed   Physical Exam  Elderly, pleasant, in NAD GEN: No acute distress.   Neck: JVP elevated Cardiac: RRR, 3/6 systolic murmur at LLSB with prominent RV heave Respiratory: Clear to auscultation bilaterally. GI: Soft, nontender, non-distended  MS: 1+ edema, compression stockings on; No deformity. Neuro:  Nonfocal  Psych: Normal affect   Labs    Chemistry Recent Labs  Lab 01/27/18 0953 01/30/18 0420 01/30/18 2026 01/31/18 0358  NA 141 136 136 137  K 3.7 4.9 4.8 4.6  CL 101 101 99 100  CO2 26 25 27 27   GLUCOSE 114*  187* 187* 159*  BUN 58* 71* 81* 81*  CREATININE 3.24* 3.90* 4.28* 4.12*  CALCIUM 10.1 9.4 9.3 9.2  PROT 7.8  --   --   --   ALBUMIN 3.4*  --   --   --   AST 34  --   --   --   ALT 22  --   --   --   ALKPHOS 101  --   --   --   BILITOT 1.4*  --   --   --   GFRNONAA 13* 11* 10* 10*  GFRAA 16* 12* 11* 12*  ANIONGAP 14 10 10 10      Hematology Recent Labs  Lab 01/30/18 0420 01/30/18 1202 01/31/18 0358  WBC 9.6 10.3 10.9*  RBC 2.95* 2.89* 2.76*  HGB 8.6* 8.7* 8.1*  HCT 28.5* 28.7* 27.0*  MCV 96.6 99.3 97.8  MCH 29.2 30.1 29.3  MCHC 30.2 30.3 30.0  RDW 14.7 17.6* 15.0  PLT 55* 75* 50*    Cardiac EnzymesNo results for input(s): TROPONINI in the last 168 hours. No results for input(s): TROPIPOC in the last 168 hours.   BNP Recent Labs  Lab 01/27/18 0953  BNP 732.8*     DDimer No results for input(s):  DDIMER in the last 168 hours.   Radiology    Dg Chest Portable 1 View  Result Date: 01/30/2018 CLINICAL DATA:  Check endotracheal tube placement EXAM: PORTABLE CHEST 1 VIEW COMPARISON:  01/29/2018 FINDINGS: Cardiac shadow is enlarged. Changes consistent with recent mitral valve repair are noted. Endotracheal tube and nasogastric catheter are noted in satisfactory position. Left jugular central line is noted with the tip in the proximal superior vena cava. No pneumothorax is noted. Left atrial enlargement is again noted. Small right pleural effusion is noted. IMPRESSION: Tubes and lines as described. Stable small right pleural effusion. Electronically Signed   By: Inez Catalina M.D.   On: 01/30/2018 09:19   Dg Chest Port 1 View  Result Date: 01/29/2018 CLINICAL DATA:  Hypoxemia. Hx of aortic aneurysm, CHF, chronic thoracic aortic dissection, COPD, heart murmur, hypertension, pulmonary HTN, cardiac catheterization, aortic dissection repair(2005). Former smoker(2005). EXAM: PORTABLE CHEST 1 VIEW COMPARISON:  01/27/2018 FINDINGS: Interval intubation with the endotracheal tube 5 cm  from carina. LEFT central venous line with tip projecting over the Bibasilar atelectasis and small RIGHT effusion. Mild central venous pulmonary congestion. No pneumothorax. Distal brachiocephalic vein. IMPRESSION: 1. Endotracheal tube in good position. 2. LEFT central venous line with tip projecting over the distal brachiocephalic vein. 3. No pneumothorax. 4. Increased RIGHT pleural effusion Electronically Signed   By: Suzy Bouchard M.D.   On: 01/29/2018 16:47    Cardiac Studies   Echo (POD #1): Study Conclusions  - Left ventricle: The cavity size was normal. Wall thickness was   normal. Systolic function was normal. The estimated ejection   fraction was in the range of 60% to 65%. Wall motion was normal;   there were no regional wall motion abnormalities. - Ventricular septum: Septal motion showed abnormal function and   dyssynergy. The contour showed diastolic flattening consistent   with right ventricular pressure/volume overload. - Aortic valve: Trileaflet; mildly thickened, mildly calcified   leaflets. There was mild stenosis. There was trivial   regurgitation. Peak velocity (S): 262 cm/s. Mean gradient (S): 13   mm Hg. Valve area (VTI): 1.78 cm^2. Valve area (Vmax): 1.68 cm^2.   Valve area (Vmean): 1.73 cm^2. - Mitral valve: Post mitra clip (2) Mobility was restricted. There   was trace to mild regurgitation. Mean gradient (D): 5 mm Hg.   Valve area by pressure half-time: 1.62 cm^2. Valve area by   continuity equation (using LVOT flow): 1.51 cm^2. - Left atrium: The atrium was severely dilated. - Right atrium: The atrium was severely dilated. - Tricuspid valve: There was severe regurgitation. - Pulmonary arteries: Systolic pressure was moderately increased.   PA peak pressure: 60 mm Hg (S).  Impressions:  - When compared to prior, mitral regurgitation is markedly   improved.   Patient Profile     72 y.o. female with chronic respiratory failure on home O2 with  severe mitral regurgitation presenting for percutaneous mitral valve repair with MitraClip 01/29/2018  Assessment & Plan    1. Severe Mitral Regurgitation with acute on chronic diastolic heart failure: now POD #2 from MitraClip. Post-procedure echo reviewed with no more than 1+ residual MR, clinically improving. Continue low-dose ASA in setting of marked thrombocytopenia. Clopidogrel or DOAC contraindicated with degree of thrombocytopenia and hx GI bleeding.   2. Acute respiratory failure: multifactorial in this compromised patient with severe COPD on home O2, pulmonary HTN, chronic heart failure. Extubated 6/27, O2 1L per [place] now - was on 3 L prior to admission.  3.  Acute on chronic diastolic CHF: diuresing with high-dose IV lasix, milrinone, and low-dose levophed. Hemodynamically stable. CVP 14-17, co-ox 80. Anticipate weaning drips but defer to Advanced HF team. Will need to be cautious with diuresing her in the setting of advanced kidney disease. She has severe TR and pulmonary HTN suspect CVP will remain elevated.  Her current weight of 174# is as low as I have seen over recent months - weight has been running 182-189# with chronic volume overload.   4. Atrial fibrillation: permanent AF, rate-controlled. Not a candidate for anticoagulation.   5. Acute on chronic renal failure: creatinine appears to be stable at 4.1 mg/dL  6. Thrombocytopenia: essentially stable at 50,000. No active bleeding  7. Anemia: acute on chronic, suspect CKD/chronic illness as primary issues.   Dispo: hopefully wean drips, transition to PO diuretics over next 24 hrs. Will need ST rehab once she is medically stable for discharge. Appreciate Advanced HF, nephrology, and CCM care.   For questions or updates, please contact [place] Please consult [email or web address] for contact info under Cardiology/STEMI.      Signed, Sherren Mocha, MD  01/31/2018, 6:51 AM

## 2018-01-31 NOTE — Plan of Care (Signed)
Neuro: Pt A&OX4, neuro exam WNL. Will continue to monitor.    Respiratory: Pt on 1L o2 via . Pt states she uses 3L o2 at home, but pt not needing home dose at this time.    Cardiovascular: Pt afebrile, BP low and pt remains on IV Levophed. Pt currently on 81mcg/min and remains in Afib at this time.   GI/GU: Pt with foley in place with 870ml of urine output throughout shift. 160mg  of IV lasix ordered BID now. Pt with good PO intake, no BM today.   Skin: Skin intact with no s/s of skin breakdown at this time. Pt able to reposition independently.    Pain: Pt with no reports of pain at this time.   Events: NO acute events throughout shift. Pts plan of care to continue with current regimen, Family updated and no further questions at this time.

## 2018-01-31 NOTE — Evaluation (Signed)
Occupational Therapy Evaluation Patient Details Name: April Mueller MRN: [id number] DOB: 06-Aug-1946 Today's Date: 01/31/2018    History of Present Illness 72 y.o. admitted for mini MVR with acute respiratory failure VDRF 6/26-6/27. PMHx: COPD on home oxygen, rheumatoid arthritis, chronic Afib, chronic diastolic CHF, Type A aortic dissection 2005,  GI bleed, anemia, CKD, OSA, and MR   Clinical Impression   Pt admitted with above. She demonstrates the below listed deficits and will benefit from continued OT to maximize safety and independence with BADLs.  Pt presents to OT with generalized weakness, decreased activity tolerance, impaired balance, and pain.  She currently requires min guard - min A for ADLs.  PTA, she was able to perform ADLs mod I.  She has PCA who assist her with transportation and IADLs, but was independent with ADLs.  She utilizes 3L 02 at home at all times.   Feel she would benefit from SNF level rehab before return to home.    02 sats 87% on 3L 02 with activity.   HR 112.       Follow Up Recommendations  SNF;Supervision/Assistance - 24 hour    Equipment Recommendations  Tub/shower seat    Recommendations for Other Services       Precautions / Restrictions Precautions Precautions: Fall Precaution Comments: watch sats      Mobility Bed Mobility               General bed mobility comments: in chair on arrival  Transfers Overall transfer level: Needs assistance Equipment used: Rolling walker (2 wheeled) Transfers: Sit to/from Stand;Stand Pivot Transfers Sit to Stand: Min guard Stand pivot transfers: Min guard       General transfer comment: cues for hand placement to reach for armrest, increased time    Balance Overall balance assessment: Needs assistance Sitting-balance support: Feet supported Sitting balance-Leahy Scale: Good     Standing balance support: Bilateral upper extremity supported;Single extremity supported Standing  balance-Leahy Scale: Poor                             ADL either performed or assessed with clinical judgement   ADL Overall ADL's : Needs assistance/impaired Eating/Feeding: Independent   Grooming: Wash/dry hands;Wash/dry face;Oral care;Brushing hair;Min guard;Standing   Upper Body Bathing: Set up;Sitting   Lower Body Bathing: Min guard;Sit to/from stand   Upper Body Dressing : Minimal assistance;Sitting   Lower Body Dressing: Minimal assistance;Sit to/from stand   Toilet Transfer: Min guard;Ambulation;Comfort height toilet;Regular Toilet;Grab bars;RW   Toileting- Clothing Manipulation and Hygiene: Minimal assistance;Sit to/from stand       Functional mobility during ADLs: Minimal assistance;Min guard;Rolling walker       Vision         Perception     Praxis      Pertinent Vitals/Pain Pain Assessment: Faces Faces Pain Scale: Hurts little more Pain Location: sore chest Pain Descriptors / Indicators: Aching;Sore Pain Intervention(s): Monitored during session     Hand Dominance Right   Extremity/Trunk Assessment Upper Extremity Assessment Upper Extremity Assessment: Overall WFL for tasks assessed   Lower Extremity Assessment Lower Extremity Assessment: Defer to PT evaluation   Cervical / Trunk Assessment Cervical / Trunk Assessment: Normal   Communication Communication Communication: No difficulties   Cognition Arousal/Alertness: Awake/alert Behavior During Therapy: WFL for tasks assessed/performed Overall Cognitive Status: Within Functional Limits for tasks assessed  General Comments  02 sats decreased to 87% with activity and 3L supplemental 02    Exercises     Shoulder Instructions      Home Living Family/patient expects to be discharged to:: Private residence Living Arrangements: Alone Available Help at Discharge: Personal care attendant Type of Home: Apartment Home Access:  Stairs to enter [employer] of Steps: 1   Home Layout: One level     Bathroom Shower/Tub: Teacher, early years/pre: Standard     Home Equipment: Environmental consultant - 4 wheels;Shower seat;Grab bars - tub/shower   Additional Comments: pt reports 3L at all times with O2      Prior Functioning/Environment Level of Independence: Needs assistance  Gait / Transfers Assistance Needed: pt reports she walks without AD in home and Rollator outside ADL's / [place] Needed: aide 3 days a week for 8 hrs for homemaking and cleaning. She performs bathing and dressing unassisted   Comments: caregivers provide transportation         OT Problem List: Decreased strength;Impaired balance (sitting and/or standing);Decreased activity tolerance;Decreased knowledge of use of DME or AE;Cardiopulmonary status limiting activity;Pain      OT Treatment/Interventions: Self-care/ADL training;Energy conservation;DME and/or AE instruction;Therapeutic activities;Patient/family education;Balance training    OT Goals(Current goals can be found in the care plan section) Acute Rehab OT Goals Patient Stated Goal: To take care of my house  OT Goal Formulation: With patient Time For Goal Achievement: 02/14/18 Potential to Achieve Goals: Good ADL Goals Pt Will Perform Grooming: with modified independence;standing Pt Will Perform Upper Body Bathing: with modified independence;standing Pt Will Perform Lower Body Bathing: with modified independence;sit to/from stand Pt Will Perform Upper Body Dressing: with modified independence;sitting;standing Pt Will Perform Lower Body Dressing: with modified independence;sit to/from stand Pt Will Transfer to Toilet: with modified independence;ambulating;regular height toilet;bedside commode;grab bars Pt Will Perform Toileting - Clothing Manipulation and hygiene: with modified independence;sit to/from stand Pt Will Perform Tub/Shower Transfer: Tub  transfer;Shower transfer;ambulating;shower seat;rolling walker;grab bars  OT Frequency: Min 2X/week   Barriers to D/C: Decreased caregiver support          Co-evaluation              AM-PAC PT "6 Clicks" Daily Activity     Outcome Measure Help from another person eating meals?: None Help from another person taking care of personal grooming?: A Little Help from another person toileting, which includes using toliet, bedpan, or urinal?: A Little Help from another person bathing (including washing, rinsing, drying)?: A Little Help from another person to put on and taking off regular upper body clothing?: A Little Help from another person to put on and taking off regular lower body clothing?: A Little 6 Click Score: 19   End of Session Equipment Utilized During Treatment: Rolling walker;Gait belt;Oxygen Nurse Communication: Mobility status  Activity Tolerance: Patient tolerated treatment well Patient left: in chair;with call bell/phone within reach;with family/visitor present  OT Visit Diagnosis: Unsteadiness on feet (R26.81)                Time: 9379-0240 OT Time Calculation (min): 38 min Charges:  OT General Charges $OT Visit: 1 Visit OT Evaluation $OT Eval Moderate Complexity: 1 Mod OT Treatments $Self Care/Home Management : 8-22 mins $Therapeutic Activity: 8-22 mins G-Codes:     [facility], OTR/L [phone]   Lucille Passy M 01/31/2018, 2:34 PM

## 2018-01-31 NOTE — Progress Notes (Signed)
Initial Nutrition Assessment  DOCUMENTATION CODES:   Not applicable  INTERVENTION:  Ensure Enlive po BID, each supplement provides 350 kcal and 20 grams of protein  Encourage adequate nutrition intake by consuming high protein foods and oral nutrition supplements.   NUTRITION DIAGNOSIS:   Inadequate oral intake related to decreased appetite as evidenced by per patient/family report.  GOAL:   Patient will meet greater than or equal to 90% of their needs  MONITOR:   PO intake, Supplement acceptance  REASON FOR ASSESSMENT:   Consult Assessment of nutrition requirement/status, Diet education  ASSESSMENT:   Pt with PMH of COPD on home O2, RA, chronic afib, CHF, CKD, OSA, CKD stage IV, and severe MR admitted for severe MR now s/p clip repair 6/25.    Pt reports that she has lived alone for the last 6 years. She only eats one meal per day due to early satiety and decreased appetite. She skips breakfast and lunch, dinner is either MOW or something she cooks. Her cooking is limited due to not being able to stand long to cook. Pt reports being lactose intolerant.  Pt drank ensure when she had a surgery in 2005 and is willing to try again.  Pt reports she has been hungry today and ate most of her breakfast and is ready for lunch at time of visit.  Pt reports her usual weight is 270 lb a year ago. In reviewing chart pt's weight was up to 200 lb in April 2018 which would indicate a 26 lb or 13% weight loss in just over a year. Per pt she thinks some of this was due to fluid.   Medications reviewed and include: 160 mg IV lasix BID 5 mcg levoped Labs reviewed: BUN/Cr 81/4.12 (H) UOP: 1280 ml x 24 hr Pt is 1 L positive    NUTRITION - FOCUSED PHYSICAL EXAM:    Most Recent Value  Orbital Region  No depletion  Upper Arm Region  No depletion  Thoracic and Lumbar Region  No depletion  Buccal Region  No depletion  Temple Region  No depletion  Clavicle Bone Region  No depletion   Clavicle and Acromion Bone Region  Mild depletion  Scapular Bone Region  No depletion  Dorsal Hand  Mild depletion  Patellar Region  No depletion  Anterior Thigh Region  No depletion  Posterior Calf Region  No depletion  Edema (RD Assessment)  Moderate  Hair  Reviewed  Eyes  Reviewed  Mouth  Reviewed  Skin  Reviewed  Nails  Reviewed       Diet Order:   Diet Order           Diet Heart Room service appropriate? Yes; Fluid consistency: Thin  Diet effective now          EDUCATION NEEDS:   Education needs have been addressed  Skin:  Skin Assessment: Reviewed RN Assessment  Last BM:  6/26  Height:   Ht Readings from Last 1 Encounters:  01/30/18 5\' 4"  (1.626 m)    Weight:   Wt Readings from Last 1 Encounters:  01/31/18 174 lb 1 oz (79 kg)    Ideal Body Weight:  54.5 kg  BMI:  Body mass index is 29.88 kg/m.  Estimated Nutritional Needs:   Kcal:  1600-1800  Protein:  90-100 grams  Fluid:  > 1.6 L/day  Maylon Peppers RD, LDN, CNSC [phone] Pager [phone] After Hours Pager

## 2018-01-31 NOTE — Progress Notes (Addendum)
April Mueller  Assessment/ Plan: Pt is a 72 y.o. yo female history of [name], April Mueller, April Mueller, April Mueller, April Mueller, April Mueller, April Mueller.   Assessment/Plan:  #Acute on chronic kidney disease stage Mueller due to hemodynamically mediated during surgery/hypotension, cardiorenal syndrome. -Patient is on Mueller Lasix 160 mg 3 times daily with urine output of 1280 cc.  On exam she looks euvolemic and her weight is down to her baseline.  I recommend to back off diuretics to home dose.  As per heart Mueller Mueller, repeating labs this afternoon and decide. -Monitor BMP, urine output. -Serum creatinine level high but is stable around 4.12 today. -Patient will need to follow with nephrologist as an outpatient.  Discussed with her.  Information about [place] [facility] provided to the patient.  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  #April heart Mueller, hypervolemia: Cardiology is following.  On diuretics as discussed above.  #History of [name], April on chronic hypoxia.   Subjective: Seen and examined at bedside.  Denied chest pain, shortness of breath, nausea vomiting. Objective Vital signs in last 24 hours: Vitals:   01/31/18 0500 01/31/18 0600 01/31/18 0700 01/31/18 0846  BP: (!) 105/59     Pulse: 71 62 72 83  Resp: (!) 25 (!) 24 (!) 21   Temp:   97.9 F (36.6 C)   TempSrc:   Oral   SpO2: 95% (!) 88% 100% 95%  Weight: 79 kg (174 lb 1 oz)     Height:       Weight change:   Intake/Output Summary (Last 24 hours) at 01/31/2018 1029 Last data filed at 01/31/2018 0700 Gross per 24 hour  Intake 1016.11 ml  Output 1280 ml  Net -263.89 ml       Labs: Basic Metabolic Panel: Recent Labs  Lab 01/30/18 0420 01/30/18 2026 01/31/18 0358  NA 136 136 137  K 4.9  4.8 4.6  CL 101 99 100  CO2 25 27 27   GLUCOSE 187* 187* 159*  BUN 71* 81* 81*  CREATININE 3.90* 4.28* 4.12*  CALCIUM 9.4 9.3 9.2   Liver Function Tests: Recent Labs  Lab 01/27/18 0953  AST 34  ALT 22  ALKPHOS 101  BILITOT 1.4*  PROT 7.8  ALBUMIN 3.4*   No results for input(s): LIPASE, AMYLASE in the last 168 hours. No results for input(s): AMMONIA in the last 168 hours. CBC: Recent Labs  Lab 01/27/18 0953 01/30/18 0420 01/30/18 1202 01/31/18 0358  WBC 7.1 9.6 10.3 10.9*  HGB 10.0* 8.6* 8.7* 8.1*  HCT 32.8* 28.5* 28.7* 27.0*  MCV 97.0 96.6 99.3 97.8  PLT 48* 55* 75* 50*   Cardiac Enzymes: No results for input(s): CKTOTAL, CKMB, CKMBINDEX, TROPONINI in the last 168 hours. CBG: Recent Labs  Lab 01/29/18 2257 01/30/18 0637 01/30/18 1053 01/30/18 1621  GLUCAP 138* 165* 149* 187*    Iron Studies: No results for input(s): IRON, TIBC, TRANSFERRIN, FERRITIN in the last 72 hours. Studies/Results: Dg Chest Portable 1 View  Result Date: 01/30/2018 CLINICAL DATA:  Check endotracheal tube placement EXAM: PORTABLE CHEST 1 VIEW COMPARISON:  01/29/2018 FINDINGS: Cardiac shadow is enlarged. Changes consistent with recent mitral valve Mueller are noted. Endotracheal tube and nasogastric catheter are noted in satisfactory position. Left jugular central line is noted with the tip in the proximal superior vena cava. No pneumothorax is  noted. Left atrial enlargement is again noted. Small right pleural effusion is noted. IMPRESSION: Tubes and lines as described. Stable small right pleural effusion. Electronically Signed   By: April Mueller M.D.   On: 01/30/2018 09:19   Dg Chest Port 1 View  Result Date: 01/29/2018 CLINICAL DATA:  Hypoxemia. Hx of aortic aneurysm, CHF, chronic thoracic aortic dissection, April, heart murmur, Mueller, April HTN, cardiac catheterization, aortic dissection Mueller(2005). Former smoker(2005). EXAM: PORTABLE CHEST 1 VIEW COMPARISON:  01/27/2018 FINDINGS:  Interval intubation with the endotracheal tube 5 cm from carina. LEFT central venous line with tip projecting over the Bibasilar atelectasis and small RIGHT effusion. Mild central venous April congestion. No pneumothorax. Distal brachiocephalic vein. IMPRESSION: 1. Endotracheal tube in good position. 2. LEFT central venous line with tip projecting over the distal brachiocephalic vein. 3. No pneumothorax. 4. Increased RIGHT pleural effusion Electronically Signed   By: April Mueller M.D.   On: 01/29/2018 16:47    Medications: Infusions: . sodium chloride 10 mL/hr at 01/31/18 0700  . sodium chloride    . furosemide    . milrinone 0.125 mcg/kg/min (01/31/18 0700)  . norepinephrine (LEVOPHED) Adult infusion 5 mcg/min (01/31/18 0700)    Scheduled Medications: . aspirin  81 mg Oral Daily  . atorvastatin  10 mg Oral Daily  . polyvinyl alcohol  1 drop Both Eyes QHS  . sodium chloride flush  3 mL Intravenous Q12H    have reviewed scheduled and prn medications.  Physical Exam: General:NAD, comfortable Heart:RRR, s1s2 nl Lungs:clear b/l, no cracjle Abdomen:soft, Non-tender, non-distended Extremities:No edema   April Mueller 01/31/2018,10:29 AM  LOS: 2 days

## 2018-01-31 NOTE — Progress Notes (Addendum)
PULMONARY / CRITICAL CARE MEDICINE   Name: MALIAH PYLES MRN: [id number] DOB: 1945-11-22    ADMISSION DATE:  01/29/2018 CONSULTATION DATE:  6/26  REFERRING MD:  Dr. 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 She is followed by Dr. Burt Knack and presented to [facility] for elective percutaneous mitral valve repair for symptom palliation (she had been deemed not a candidate for open procedure). The procedure was without complication, but the postoperative course was complicated by hypoxia and lethargy. She was started on BiPAP. ABG was drawn and demonstrated profound respiratory acidosis. She was emergently intubated by anesthesia and was transferred to ICU for further care. PCCM consulted.   PAST MEDICAL HISTORY :  She  has a past medical history of Anemia (03/23/15), Aortic aneurysm ([place]), CHF (congestive heart failure) (HCC), Chronic lower back pain, Chronic thoracic aortic dissection (HCC), CKD (chronic kidney disease) stage 3, GFR 30-59 ml/min (HCC), COPD (chronic obstructive pulmonary disease) ([place]), Gout, History of blood transfusion ("several"), Hypertension, Lupus ([place]), On home oxygen therapy, OSA on CPAP, Pulmonary HTN ([place]), Rheumatoid arthritis(714.0) (11/06/2012), S/P aortic dissection repair, and Tuberculosis.   SUBJECTIVE:  No acute events overnight Walked with PT this am, up in chair States she is a little weak, but feels much better than yesterday On 1L [place] (previous Home requirement 3L) Complains of mild sore throat, but tolerable Voice clear  Remains on Levophed & Milrinone infusions  VITAL SIGNS: BP (!) 105/59   Pulse 83   Temp 97.9 F (36.6 C) (Oral)   Resp (!) 21   Ht 5\' 4"  (1.626 m)   Wt 174 lb 1 oz  (79 kg)   SpO2 95%   BMI 29.88 kg/m   HEMODYNAMICS: CVP:  [14 mmHg-18 mmHg] 14 mmHg  INTAKE / OUTPUT: I/O last 3 completed shifts: In: 1760.9 [P.O.:180; I.V.:1373.6; IV Piggyback:207.3] Out: 1675 [Urine:1675]  PHYSICAL EXAMINATION: General:  Chronically ill appearing female, sitting up in chair, on 1L [place], in no acute distress   Neuro:  Awake, A&O x4, follows commands, no focal deficits    HEENT:  Atraumatic, normocephalic, neck supple Cardiovascular: RRR, 3/6 systolic murmur, 2+ BLE edema  Lungs:  Clear breath sounds with crackles to bilateral bases, even, non-labored, no assessory muscle use Abdomen:  Soft, non-tender, non-distended, BS+ x4  Musculoskeletal: No deformities, normal bulk and tone  Skin:  Warm, dry.  No obvious rashes, lesions, or ulcerations   LABS:  BMET Recent Labs  Lab 01/30/18 0420 01/30/18 2026 01/31/18 0358  NA 136 136 137  K 4.9 4.8 4.6  CL 101 99 100  CO2 25 27 27   BUN 71* 81* 81*  CREATININE 3.90* 4.28* 4.12*  GLUCOSE 187* 187* 159*    Electrolytes Recent Labs  Lab 01/30/18 0420 01/30/18 2026 01/31/18 0358  CALCIUM 9.4 9.3 9.2    CBC Recent Labs  Lab 01/30/18 0420 01/30/18 1202 01/31/18 0358  WBC 9.6 10.3 10.9*  HGB 8.6* 8.7* 8.1*  HCT 28.5* 28.7* 27.0*  PLT 55* 75* 50*    Coag's Recent Labs  Lab 01/27/18 0953  APTT 33  INR 1.32    Sepsis Markers No results for input(s): LATICACIDVEN, PROCALCITON, O2SATVEN in the last 168 hours.  ABG Recent Labs  Lab 01/27/18 [phone]  01/29/18 1549 01/29/18 1700  PHART 7.526* 7.163* 7.310*  PCO2ART 37.3 83.9* 53.8*  PO2ART 77.9* 360.0* 387*    Liver Enzymes Recent Labs  Lab 01/27/18 0953  AST 34  ALT 22  ALKPHOS 101  BILITOT 1.4*  ALBUMIN 3.4*    Cardiac Enzymes No results for input(s): TROPONINI, PROBNP in the last 168 hours.  Glucose Recent Labs  Lab 01/29/18 2257 01/30/18 0637 01/30/18 1053 01/30/18 1621  GLUCAP 138* 165* 149* 187*    Imaging No results  found.   STUDIES:  TEE 4/17 > LVEF 55-60% Echo 6/26 >  TEE 6/26 >   CULTURES:   ANTIBIOTICS:   SIGNIFICANT EVENTS: 6/26 mitral valve repair, post op lethargy. Intubated.  6/27 Extubated  LINES/TUBES: ETT 6/26 >6/27 CVL 6/26 >  DISCUSSION: 72 year old female with cardiac history who presented for elective mitral valve repair 6/26. Postoperative course complicated by lethargy, hypoxia, and hypercarbia causing acidosis. She was intubated and transferred to ICU.    ASSESSMENT / PLAN:  PULMONARY A: Acute on chronic mixed respiratory failure (on home O2)>>improved COPD without acute exacerbation OSA on CPAP  P:   Supplemental O2 prn to maintain O2 sats 88-92% Prn Levalbuterol nebs CPAP at night (may use home CPAP)   CARDIOVASCULAR A:  Severe MR now s/p Mitral valve repair 6/78 Chronic diastolic CHF Severe TR Hypotension s/p intubation. At risk cardiogenic shock  P:  Cardiology & Heart failure services following (primary service) Maintain MAP >65 Levophed as needed to maintain MAP goal, wean as tolerated Continue Milrinone per HF service   RENAL A:   CKD: serum creatinine at baseline  P:   Nephrology following, appreciate input Monitor I&O's / urine output Trend BMP Ensure adequate renal perfusion Avoid Nephrotoxic agents as able Replace electrolytes as indicated  GASTROINTESTINAL A:   No acute issues  P:   Diet as tolerated  HEMATOLOGIC A:   Thrombocytopenia Anemia  P:  Monitor for s/sx of bleeding Trend CBC Transfuse for Hgb<7 Transfuse PLT for PLT <50 and bleeding  INFECTIOUS A:   No acute issues  P:   Monitor fever curve Trend WBC  ENDOCRINE A:   No active issues  P:   Follow Glucose on BMP's  NEUROLOGIC A:   No active issues  P:   Provide supportive care Avoid sedating meds as able Lights on during the day OOB as able PT consulted   FAMILY  - Updates: Updated pt at bedside 6/28.  - Inter-disciplinary family  meet or Palliative Care meeting due by:  7/3  Darel Hong, AGACNP-BC [facility] 01/31/2018 9:01 AM  Attending Note:  72 year old female with extensive cardiac history who is O2 dependent at home due to COPD who presents to PCCM with respiratory failure from pulmonary edema post cath.  Patient also has history of CKD with renal following.  On exam, tolerated extubation well and down to 1L [place] (3 at home).  I reviewed CXR myself, improving pulmonary edema.  Discussed with PCCM-NP.  Respiratory failure:  - Monitor for airway protection  - IS   - Flutter valve  Hypoxemia:  - Titrate O2 for sat of 88-92%  COPD:  - BD as ordered  - No need for spiriva  Pulmonary edema:  - Diureses as renal function allows  CKD:   - Renal following  PCCM will sign off, please call back if needed.  Patient seen and examined, agree with above note.  I dictated the  care and orders written for this patient under my direction.  Rush Farmer, [place]

## 2018-01-31 NOTE — Evaluation (Signed)
Physical Therapy Evaluation Patient Details Name: April Mueller MRN: [id number] DOB: Oct 12, 1945 Today's Date: 01/31/2018   History of Present Illness  72 y.o. admitted for mini MVR with acute respiratory failure VDRF 6/26-6/27. PMHx: COPD on home oxygen, rheumatoid arthritis, chronic Afib, chronic diastolic CHF, Type A aortic dissection 2005,  GI bleed, anemia, CKD, OSA, and MR  Clinical Impression  Pt with flat affect and reports since she retired her goal is to do absolutely nothing and not have commitments. Pt required encouragement to participate and mobilize. Pt on 3L at all times at home sating 95% on 1L at rest and dropping to 88% on 2L with limited gait. Pt with decreased mobility, gait, balance and activity tolerance without significant assist at home who will benefit from acute therapy to maximize mobility and safety. Will follow and recommend ambulation with nursing.   BP pre 99/57, post 93/57 HR 68-83     Follow Up Recommendations SNF;Supervision for mobility/OOB    Equipment Recommendations  None recommended by PT    Recommendations for Other Services       Precautions / Restrictions Precautions Precautions: Fall Precaution Comments: watch sats      Mobility  Bed Mobility               General bed mobility comments: in chair on arrival  Transfers Overall transfer level: Needs assistance   Transfers: Sit to/from Stand Sit to Stand: Min guard         General transfer comment: cues for hand placement to reach for armrest, increased time  Ambulation/Gait Ambulation/Gait assistance: Min assist Gait Distance (Feet): 80 Feet Assistive device: Rolling walker (2 wheeled) Gait Pattern/deviations: Step-through pattern;Decreased stride length;Trunk flexed   Gait velocity interpretation: <1.8 ft/sec, indicate of risk for recurrent falls General Gait Details: pt with min assist to steer and direct RW with cues to step into RW, pt maintains flexed posture  and report poor back with inability to be fully erect  Stairs            Wheelchair Mobility    Modified Rankin (Stroke Patients Only)       Balance Overall balance assessment: Needs assistance   Sitting balance-Leahy Scale: Good       Standing balance-Leahy Scale: Poor                               Pertinent Vitals/Pain Pain Assessment: 0-10 Pain Score: 4  Pain Location: sore chest Pain Descriptors / Indicators: Aching;Sore Pain Intervention(s): Limited activity within patient's tolerance;Repositioned    Home Living Family/patient expects to be discharged to:: Private residence Living Arrangements: Alone Available Help at Discharge: Personal care attendant Type of Home: Apartment Home Access: Stairs to enter   [employer] of Steps: 1 Home Layout: One level Home Equipment: [place] - 4 wheels;Shower seat;Grab bars - tub/shower Additional Comments: pt reports 3L at all times with O2    Prior Function Level of Independence: Needs assistance   Gait / Transfers Assistance Needed: pt reports she walks without AD in home and Rollator outside  ADL's / [place] Needed: aide 3 days a week for 8 hrs for homemaking and cleaning. She performs bathing and dressing unassisted        Hand Dominance        Extremity/Trunk Assessment   Upper Extremity Assessment Upper Extremity Assessment: Defer to OT evaluation    Lower Extremity Assessment Lower Extremity Assessment: Generalized weakness  Cervical / Trunk Assessment Cervical / Trunk Assessment: Normal  Communication   Communication: No difficulties  Cognition Arousal/Alertness: Awake/alert Behavior During Therapy: Flat affect Overall Cognitive Status: Within Functional Limits for tasks assessed                                        General Comments      Exercises     Assessment/Plan    PT Assessment Patient needs continued PT services  PT  Problem List Decreased strength;Decreased mobility;Decreased activity tolerance;Decreased knowledge of use of DME       PT Treatment Interventions DME instruction;Therapeutic activities;Gait training;Therapeutic exercise;Patient/family education;Functional mobility training;Balance training    PT Goals (Current goals can be found in the Care Plan section)  Acute Rehab PT Goals Patient Stated Goal: go home and "do nothing" PT Goal Formulation: With patient Time For Goal Achievement: 02/14/18 Potential to Achieve Goals: Fair    Frequency Min 3X/week   Barriers to discharge Decreased caregiver support      Co-evaluation               AM-PAC PT "6 Clicks" Daily Activity  Outcome Measure Difficulty turning over in bed (including adjusting bedclothes, sheets and blankets)?: A Little Difficulty moving from lying on back to sitting on the side of the bed? : A Little Difficulty sitting down on and standing up from a chair with arms (e.g., wheelchair, bedside commode, etc,.)?: A Little Help needed moving to and from a bed to chair (including a wheelchair)?: A Little Help needed walking in hospital room?: A Little Help needed climbing 3-5 steps with a railing? : A Lot 6 Click Score: 17    End of Session Equipment Utilized During Treatment: Gait belt;Oxygen Activity Tolerance: Patient tolerated treatment well;Patient limited by fatigue Patient left: in chair;with call bell/phone within reach Nurse Communication: Mobility status PT Visit Diagnosis: Other abnormalities of gait and mobility (R26.89);Muscle weakness (generalized) (M62.81)    Time: 9179-1505 PT Time Calculation (min) (ACUTE ONLY): 21 min   Charges:   PT Evaluation $PT Eval Moderate Complexity: 1 Mod     PT G Codes:        Elwyn Reach, PT [phone]   [place] B Rubie Ficco 01/31/2018, 8:51 AM

## 2018-01-31 NOTE — Progress Notes (Addendum)
Advanced Heart Failure Rounding Note  PCP-Cardiologist: No primary care provider on file.   Subjective:    Remains on milrinone 0.125 mcg/kg/min and norepinephrine 5 mcg/min. Co-ox 81%.   Urine output has picked up with high-dose IV lasix. Weight down one pound. Creatinine 4.3-> 4.1. CVP 15  Feeling better. No SOB at rest but got SOB walking in room. Throat mildly sore from ETT. No orthopnea or PND. SBP in 80-90s  Objective:   Weight Range: 79 kg (174 lb 1 oz) Body mass index is 29.88 kg/m.   Vital Signs:   Temp:  [97.9 F (36.6 C)-98.8 F (37.1 C)] 97.9 F (36.6 C) (06/28 0700) Pulse Rate:  [50-83] 83 (06/28 0846) Resp:  [18-30] 21 (06/28 0700) BP: (83-115)/(50-75) 105/59 (06/28 0500) SpO2:  [88 %-100 %] 95 % (06/28 0846) Weight:  [79 kg (174 lb 1 oz)-79.7 kg (175 lb 12.8 oz)] 79 kg (174 lb 1 oz) (06/28 0500) Last BM Date: 01/29/18  Weight change: Filed Weights   01/30/18 1623 01/31/18 0500  Weight: 79.7 kg (175 lb 12.8 oz) 79 kg (174 lb 1 oz)    Intake/Output:   Intake/Output Summary (Last 24 hours) at 01/31/2018 1006 Last data filed at 01/31/2018 0700 Gross per 24 hour  Intake 1016.11 ml  Output 1280 ml  Net -263.89 ml      Physical Exam    CVP 15 General:  Elderly woman sitting up in chair. [place] appearing  No resp difficulty HEENT: normal Neck: supple. JVP to jaw. LIJ TLC Carotids 2+ bilat; no bruits. No lymphadenopathy or thryomegaly appreciated. Cor: PMI nondisplaced. IRR 2/6 TR Lungs: clear with decreased BS throughout Abdomen: obese soft, nontender, nondistended. No hepatosplenomegaly. No bruits or masses. Good bowel sounds. Extremities: no cyanosis, clubbing, rash, 1+ edema Neuro: alert & orientedx3, cranial nerves grossly intact. moves all 4 extremities w/o difficulty. Affect pleasant   Telemetry   Afib 70-80s. Personally reviewed.   EKG    No new tracings.   Labs    CBC Recent Labs    01/30/18 1202 01/31/18 0358  WBC 10.3 10.9*   HGB 8.7* 8.1*  HCT 28.7* 27.0*  MCV 99.3 97.8  PLT 75* 50*   Basic Metabolic Panel Recent Labs    01/30/18 2026 01/31/18 0358  NA 136 137  K 4.8 4.6  CL 99 100  CO2 27 27  GLUCOSE 187* 159*  BUN 81* 81*  CREATININE 4.28* 4.12*  CALCIUM 9.3 9.2   Liver Function Tests No results for input(s): AST, ALT, ALKPHOS, BILITOT, PROT, ALBUMIN in the last 72 hours. No results for input(s): LIPASE, AMYLASE in the last 72 hours. Cardiac Enzymes No results for input(s): CKTOTAL, CKMB, CKMBINDEX, TROPONINI in the last 72 hours.  BNP: BNP (last 3 results) Recent Labs    01/27/18 0953  BNP 732.8*    ProBNP (last 3 results) No results for input(s): PROBNP in the last 8760 hours.   D-Dimer No results for input(s): DDIMER in the last 72 hours. Hemoglobin A1C No results for input(s): HGBA1C in the last 72 hours. Fasting Lipid Panel No results for input(s): CHOL, HDL, LDLCALC, TRIG, CHOLHDL, LDLDIRECT in the last 72 hours. Thyroid Function Tests No results for input(s): TSH, T4TOTAL, T3FREE, THYROIDAB in the last 72 hours.  Invalid input(s): FREET3  Other results:   Imaging    No results found.   Medications:     Scheduled Medications: . aspirin  81 mg Oral Daily  . atorvastatin  10 mg  Oral Daily  . polyvinyl alcohol  1 drop Both Eyes QHS  . sodium chloride flush  3 mL Intravenous Q12H    Infusions: . sodium chloride 10 mL/hr at 01/31/18 0700  . sodium chloride    . furosemide Stopped (01/31/18 0501)  . milrinone 0.125 mcg/kg/min (01/31/18 0700)  . norepinephrine (LEVOPHED) Adult infusion 5 mcg/min (01/31/18 0700)    PRN Medications: sodium chloride, acetaminophen, levalbuterol, ondansetron (ZOFRAN) IV, sodium chloride flush    Patient Profile   Ms Horn is a62 y.o.with COPD on home oxygen, rheumatoid arthritis, chronic atrial fibrillation, chronic diastolic CHF, Type A aortic dissection 2005,  GI bleed, anemia, CKD, OSA, and MR.    Admitted for  mitral clip.  Assessment/Plan   1. Severe MR, S/P Mitral Clip Repair 1/61 - Complicated by respiratory failure  - Post-clip echo showed mild MR with mean gradient 5 mmHg. Severe TR with RV failure  2. Acute Hypoxic Respiratory Failure  - Urgent intubation 01/29/2018, extubated 6/27 - Doing well on 1 L [place]  3. Acute/Diastolic Heart Failure - Volume status improving slowly but steadily - Weight down well below baseline currently - Echo 6/27: EF 60-65%, mild AS, mild MR, RA and LA atria severly dilated, severe TR, PA peak pressure 60 mmHg - Diuresing on Lasix 160 IV tid in setting of very advanced renal disease - CVP 15 but with severe RV failure may be getting close to as good as we can get her. Will continue diuresis for one more day with intorope support as renal function tolerates then wean.Will drop lasix to 160 IV bid. Repeat BMET later today and if renal function worse can stop - RHC 3/17 CVP was 13 with PCWP 17 (in setting of severe MR) - Continue milrinone 0.125 and norepinephrine to keep SBP > 95 for now - Co-ox 78%.  4. Chronic A fib  Rate controlled 80s She has not been on anticoagulants with history of GI Bleed.  5. 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 No change.   6.AKI on CKD stage IV  Creatinine baseline ~3  - Creatinine 4.3->4.1 this am.  - Nephrology has seen  7. H/O GI bleed  Extensive GI work up without evidence of bleeding.  - Hemoglobin 8.1 this am.  - Place SCDs.   Length of Stay: 2  Glori Bickers, MD  01/31/2018, 10:06 AM  Advanced Heart Failure Team Pager [phone] (M-F; 7a - 4p)  Please contact [facility] for night-coverage after hours (4p -7a ) and weekends on [email or web address]

## 2018-02-01 LAB — BASIC METABOLIC PANEL
Anion gap: 8 (ref 5–15)
BUN: 90 mg/dL — AB (ref 8–23)
CO2: 28 mmol/L (ref 22–32)
CREATININE: 4.1 mg/dL — AB (ref 0.44–1.00)
Calcium: 9 mg/dL (ref 8.9–10.3)
Chloride: 100 mmol/L (ref 98–111)
GFR calc Af Amer: 12 mL/min — ABNORMAL LOW (ref >60)
GFR calc non Af Amer: 10 mL/min — ABNORMAL LOW (ref >60)
GLUCOSE: 149 mg/dL — AB (ref 70–99)
Potassium: 3.8 mmol/L (ref 3.5–5.1)
Sodium: 136 mmol/L (ref 135–145)

## 2018-02-01 LAB — COOXEMETRY PANEL
Carboxyhemoglobin: 1.7 % — ABNORMAL HIGH (ref 0.5–1.5)
Methemoglobin: 2.1 % — ABNORMAL HIGH (ref 0.0–1.5)
O2 SAT: 84.4 %
TOTAL HEMOGLOBIN: 7.5 g/dL — AB (ref 12.0–16.0)

## 2018-02-01 MED ORDER — SENNOSIDES-DOCUSATE SODIUM 8.6-50 MG PO TABS
1.0000 | ORAL_TABLET | Freq: Every day | ORAL | Status: DC
  Administered 2018-02-03 – 2018-02-06 (×4): 1 via ORAL
  Filled 2018-02-01 (×4): qty 1

## 2018-02-01 MED ORDER — SODIUM CHLORIDE 0.9% FLUSH
10.0000 mL | Freq: Two times a day (BID) | INTRAVENOUS | Status: DC
  Administered 2018-02-01 (×2): 10 mL
  Administered 2018-02-02: 20 mL
  Administered 2018-02-02 – 2018-02-07 (×9): 10 mL

## 2018-02-01 MED ORDER — SORBITOL 70 % SOLN
30.0000 mL | Freq: Once | Status: AC
  Administered 2018-02-01: 30 mL via ORAL
  Filled 2018-02-01: qty 30

## 2018-02-01 MED ORDER — CHLORHEXIDINE GLUCONATE CLOTH 2 % EX PADS
6.0000 | MEDICATED_PAD | Freq: Every day | CUTANEOUS | Status: DC
  Administered 2018-02-01 – 2018-02-03 (×3): 6 via TOPICAL

## 2018-02-01 MED ORDER — SODIUM CHLORIDE 0.9% FLUSH: 10.0000 mL | INTRAVENOUS | Status: DC | PRN

## 2018-02-01 NOTE — Progress Notes (Signed)
Pt c/o abd pain and bloating. Pt reports last BM prior to admission. Md paged and requested bowel regimen. Awaiting new orders at this time. Will continue to monitor.

## 2018-02-01 NOTE — Progress Notes (Addendum)
Advanced Heart Failure Rounding Note  PCP-Cardiologist: No primary care provider on file.   Subjective:    Remains on milrinone 0.125 mcg/kg/min and norepinephrine down to 3. Co-ox 84% SBP 90-100  Lasix stopped yesterday as creatinine was climbing. Weight down to 172. (Baseline weight 183-189)   Feels weaker today. Denies CP, SOB, orthopnea or PND. CVP 12-13  Creatinine 4.3-> 4.1-> 4.2->4.1    Objective:   Weight Range: 78.4 kg (172 lb 13.5 oz) Body mass index is 29.67 kg/m.   Vital Signs:   Temp:  [97.8 F (36.6 C)-98.4 F (36.9 C)] 97.8 F (36.6 C) (06/29 0823) Pulse Rate:  [62-83] 72 (06/29 0900) Resp:  [17-30] 18 (06/29 0900) BP: (81-120)/(46-64) 99/54 (06/29 0800) SpO2:  [91 %-100 %] 99 % (06/29 0900) Weight:  [78.4 kg (172 lb 13.5 oz)] 78.4 kg (172 lb 13.5 oz) (06/29 0600) Last BM Date: 01/29/18  Weight change: Filed Weights   01/30/18 1623 01/31/18 0500 02/01/18 0600  Weight: 79.7 kg (175 lb 12.8 oz) 79 kg (174 lb 1 oz) 78.4 kg (172 lb 13.5 oz)    Intake/Output:   Intake/Output Summary (Last 24 hours) at 02/01/2018 1005 Last data filed at 02/01/2018 0800 Gross per 24 hour  Intake 1304.84 ml  Output 2380 ml  Net -1075.16 ml      Physical Exam    CVP 12-13 General:  Elderly woman lying in bed No resp difficulty HEENT: normal Neck: supple. LIJ TLC. JVP to jaw Carotids 2+ bilat; no bruits. No lymphadenopathy or thryomegaly appreciated. Cor: PMI nondisplaced. IRR. IRR 2/6 TR No rubs, gallops or murmurs. +RV lift Lungs: clear Abdomen: soft, nontender, nondistended. No hepatosplenomegaly. No bruits or masses. Good bowel sounds. Extremities: no cyanosis, clubbing, rash, trace edema TED hose Neuro: alert & orientedx3, cranial nerves grossly intact. moves all 4 extremities w/o difficulty. Affect pleasant   Telemetry   Afib 70-80s. Personally reviewed  EKG    No new tracings.   Labs    CBC Recent Labs    01/30/18 1202 01/31/18 0358  WBC  10.3 10.9*  HGB 8.7* 8.1*  HCT 28.7* 27.0*  MCV 99.3 97.8  PLT 75* 50*   Basic Metabolic Panel Recent Labs    01/31/18 1502 02/01/18 0343  NA 137 136  K 4.3 3.8  CL 99 100  CO2 27 28  GLUCOSE 123* 149*  BUN 86* 90*  CREATININE 4.21* 4.10*  CALCIUM 9.1 9.0   Liver Function Tests No results for input(s): AST, ALT, ALKPHOS, BILITOT, PROT, ALBUMIN in the last 72 hours. No results for input(s): LIPASE, AMYLASE in the last 72 hours. Cardiac Enzymes No results for input(s): CKTOTAL, CKMB, CKMBINDEX, TROPONINI in the last 72 hours.  BNP: BNP (last 3 results) Recent Labs    01/27/18 0953  BNP 732.8*    ProBNP (last 3 results) No results for input(s): PROBNP in the last 8760 hours.   D-Dimer No results for input(s): DDIMER in the last 72 hours. Hemoglobin A1C No results for input(s): HGBA1C in the last 72 hours. Fasting Lipid Panel No results for input(s): CHOL, HDL, LDLCALC, TRIG, CHOLHDL, LDLDIRECT in the last 72 hours. Thyroid Function Tests No results for input(s): TSH, T4TOTAL, T3FREE, THYROIDAB in the last 72 hours.  Invalid input(s): FREET3  Other results:   Imaging    No results found.   Medications:     Scheduled Medications: . ALPRAZolam  0.25 mg Oral QHS  . aspirin  81 mg Oral Daily  .  atorvastatin  10 mg Oral Daily  . Chlorhexidine Gluconate Cloth  6 each Topical Daily  . feeding supplement (ENSURE ENLIVE)  237 mL Oral BID BM  . polyvinyl alcohol  1 drop Both Eyes QHS  . senna-docusate  1 tablet Oral QHS  . sodium chloride flush  10-40 mL Intracatheter Q12H  . sodium chloride flush  3 mL Intravenous Q12H    Infusions: . sodium chloride 10 mL/hr at 02/01/18 0800  . sodium chloride    . milrinone 0.125 mcg/kg/min (02/01/18 0800)  . norepinephrine (LEVOPHED) Adult infusion 3 mcg/min (02/01/18 0800)    PRN Medications: sodium chloride, acetaminophen, levalbuterol, ondansetron (ZOFRAN) IV, sodium chloride flush, sodium chloride  flush    Patient Profile   April Mueller is a34 y.o.with COPD on home oxygen, rheumatoid arthritis, chronic atrial fibrillation, chronic diastolic CHF, Type A aortic dissection 2005,  GI bleed, anemia, CKD, OSA, and MR.    Admitted for mitral clip.  Assessment/Plan   1. Severe MR, S/P Mitral Clip Repair 2/70 - Complicated by respiratory failure  - Post-clip echo showed mild MR with mean gradient 5 mmHg. Severe TR with RV failure  2. Acute Hypoxic Respiratory Failure  - Urgent intubation 01/29/2018, extubated 6/27 - Stable on [place]  3. Acute/Diastolic Heart Failure - Volume status much improved. - Weight down well below baseline currently. Now off lasix. Keep off lasix - Echo 6/27: EF 60-65%, mild AS, mild MR, RA and LA atria severly dilated, severe TR, PA peak pressure 60 mmHg - CVP today 12-13 - RHC 3/17 CVP was 13 with PCWP 17 (in setting of severe MR) - Wean milrinone and NE to off as BP tolerates - Co-ox 84%.  4. Chronic A fib  Rate controlled 70-80s She has not been on anticoagulants with history of GI Bleed.  5. 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 No change.   6.AKI on CKD stage IV  Creatinine baseline ~3  - Creatinine 4.1 this am. Hold diuretics  - Discussed with Dr. Carolin Sicks in Nephrology  7. H/O GI bleed  Extensive GI work up without evidence of bleeding.  - Hemoglobin not drawn this am - Continue SCDs.  8. Dispo - PT/OT consult. Will need SNF  Length of Stay: 3  Glori Bickers, MD  02/01/2018, 10:05 AM  Advanced Heart Failure Team Pager [phone] (M-F; 7a - 4p)  Please contact [facility] for night-coverage after hours (4p -7a ) and weekends on [email or web address]

## 2018-02-01 NOTE — Progress Notes (Signed)
[facility] NEPHROLOGY PROGRESS NOTE  Assessment/ Plan: Pt is a 72 y.o. yo female history of A. fib, congestive heart failure follows with heart failure team, pulmonary hypertension, COPD on home oxygen, severe mitral regurgitation/tricuspid regurgitation status post repair, CKD stage IV, consulted for renal failure.   Assessment/Plan:  #Acute on chronic kidney disease stage IV due to hemodynamically mediated during surgery/hypotension, cardiorenal syndrome. -Urine output of more than 2.5 L in 24 hours, patient looks euvolemic and has hypotension.  Diuretics held by heart failure team and  started on milrinone and Levophed.  Serum creatinine level is stable at 4.1.  Managing diuretics by heart failure team.  Discussed with Dr.Bensimhon. I will sign off at this time.  I recommend patient to follow-up with [facility] to manage her CKD.  Information provided to the patient.  I discussed this with the patient.  -Monitor BMP, urine output.  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  #Congestive heart failure, hypervolemia: Cardiology is following.  Held diuretics.  Euvolemic on exam.  #History of A. fib, COPD on chronic hypoxia.  Please, call back with any question.  Subjective: Examined at bedside.  Has generalized weakness.  No nausea, vomiting, chest pain, shortness of breath. Objective Vital signs in last 24 hours: Vitals:   02/01/18 0700 02/01/18 0800 02/01/18 0823 02/01/18 0900  BP: 99/62 (!) 99/54    Pulse: 73 77  72  Resp: 18 (!) 24  18  Temp:   97.8 F (36.6 C)   TempSrc:   Oral   SpO2: 100% 98%  99%  Weight:      Height:       Weight change: -1.342 kg (-2 lb 15.4 oz)  Intake/Output Summary (Last 24 hours) at 02/01/2018 0941 Last data filed at 02/01/2018 0800 Gross per 24 hour  Intake 1304.84 ml  Output 2380 ml  Net -1075.16 ml       Labs: Basic Metabolic Panel: Recent Labs  Lab 01/31/18 0358 01/31/18 1502  02/01/18 0343  NA 137 137 136  K 4.6 4.3 3.8  CL 100 99 100  CO2 27 27 28   GLUCOSE 159* 123* 149*  BUN 81* 86* 90*  CREATININE 4.12* 4.21* 4.10*  CALCIUM 9.2 9.1 9.0   Liver Function Tests: Recent Labs  Lab 01/27/18 0953  AST 34  ALT 22  ALKPHOS 101  BILITOT 1.4*  PROT 7.8  ALBUMIN 3.4*   No results for input(s): LIPASE, AMYLASE in the last 168 hours. No results for input(s): AMMONIA in the last 168 hours. CBC: Recent Labs  Lab 01/27/18 0953 01/30/18 0420 01/30/18 1202 01/31/18 0358  WBC 7.1 9.6 10.3 10.9*  HGB 10.0* 8.6* 8.7* 8.1*  HCT 32.8* 28.5* 28.7* 27.0*  MCV 97.0 96.6 99.3 97.8  PLT 48* 55* 75* 50*   Cardiac Enzymes: No results for input(s): CKTOTAL, CKMB, CKMBINDEX, TROPONINI in the last 168 hours. CBG: Recent Labs  Lab 01/29/18 2257 01/30/18 0637 01/30/18 1053 01/30/18 1621  GLUCAP 138* 165* 149* 187*    Iron Studies: No results for input(s): IRON, TIBC, TRANSFERRIN, FERRITIN in the last 72 hours. Studies/Results: No results found.  Medications: Infusions: . sodium chloride 10 mL/hr at 02/01/18 0800  . sodium chloride    . milrinone 0.125 mcg/kg/min (02/01/18 0800)  . norepinephrine (LEVOPHED) Adult infusion 3 mcg/min (02/01/18 0800)    Scheduled Medications: . ALPRAZolam  0.25 mg Oral QHS  . aspirin  81 mg Oral Daily  . atorvastatin  10  mg Oral Daily  . Chlorhexidine Gluconate Cloth  6 each Topical Daily  . feeding supplement (ENSURE ENLIVE)  237 mL Oral BID BM  . polyvinyl alcohol  1 drop Both Eyes QHS  . senna-docusate  1 tablet Oral QHS  . sodium chloride flush  10-40 mL Intracatheter Q12H  . sodium chloride flush  3 mL Intravenous Q12H    have reviewed scheduled and prn medications.  Physical Exam: General: Sitting on chair, comfortable Heart: Regular rate rhythm, S1-S2 normal, no rubs Lungs:, No crackle Abdomen:soft, Non-tender, non-distended Extremities:No edema   Zilla Shartzer Prasad Okie Bogacz 02/01/2018,9:41 AM  LOS: 3 days

## 2018-02-01 NOTE — Plan of Care (Signed)
  Problem: Elimination: Goal: Will not experience complications related to bowel motility Outcome: Progressing   Problem: Elimination: Goal: Will not experience complications related to urinary retention Outcome: Progressing   Problem: Pain Managment: Goal: General experience of comfort will improve Outcome: Progressing   Problem: Safety: Goal: Ability to remain free from injury will improve Outcome: Progressing   Problem: Activity: Goal: Ability to tolerate increased activity will improve Outcome: Progressing   Problem: Education: Goal: Knowledge of disease and its progression will improve Outcome: Progressing   Problem: Fluid Volume: Goal: Compliance with measures to maintain balanced fluid volume will improve Outcome: Progressing   Problem: Nutritional: Goal: Ability to make healthy dietary choices will improve Outcome: Progressing   Problem: Activity: Goal: Capacity to carry out activities will improve Outcome: Progressing   Problem: Cardiac: Goal: Ability to achieve and maintain adequate cardiopulmonary perfusion will improve Outcome: Progressing

## 2018-02-02 ENCOUNTER — Inpatient Hospital Stay (HOSPITAL_COMMUNITY): Payer: Medicare Other

## 2018-02-02 LAB — BASIC METABOLIC PANEL
Anion gap: 8 (ref 5–15)
BUN: 91 mg/dL — AB (ref 8–23)
CO2: 30 mmol/L (ref 22–32)
Calcium: 8.9 mg/dL (ref 8.9–10.3)
Chloride: 102 mmol/L (ref 98–111)
Creatinine, Ser: 3.79 mg/dL — ABNORMAL HIGH (ref 0.44–1.00)
GFR calc Af Amer: 13 mL/min — ABNORMAL LOW (ref >60)
GFR, EST NON AFRICAN AMERICAN: 11 mL/min — AB (ref >60)
GLUCOSE: 86 mg/dL (ref 70–99)
Potassium: 3.5 mmol/L (ref 3.5–5.1)
Sodium: 140 mmol/L (ref 135–145)

## 2018-02-02 LAB — COOXEMETRY PANEL
CARBOXYHEMOGLOBIN: 1.6 % — AB (ref 0.5–1.5)
METHEMOGLOBIN: 1.9 % — AB (ref 0.0–1.5)
O2 Saturation: 75.7 %
TOTAL HEMOGLOBIN: 7.6 g/dL — AB (ref 12.0–16.0)

## 2018-02-02 LAB — IRON AND TIBC
Iron: 23 ug/dL — ABNORMAL LOW (ref 28–170)
Saturation Ratios: 9 % — ABNORMAL LOW (ref 10.4–31.8)
TIBC: 266 ug/dL (ref 250–450)
UIBC: 243 ug/dL

## 2018-02-02 MED ORDER — CHLORHEXIDINE GLUCONATE 0.12 % MT SOLN
15.0000 mL | Freq: Two times a day (BID) | OROMUCOSAL | Status: DC
  Administered 2018-02-02 – 2018-02-07 (×10): 15 mL via OROMUCOSAL
  Filled 2018-02-02 (×10): qty 15

## 2018-02-02 MED ORDER — GI COCKTAIL ~~LOC~~
30.0000 mL | Freq: Three times a day (TID) | ORAL | Status: DC | PRN
  Administered 2018-02-02 – 2018-02-03 (×3): 30 mL via ORAL
  Filled 2018-02-02 (×3): qty 30

## 2018-02-02 MED ORDER — POTASSIUM CHLORIDE CRYS ER 20 MEQ PO TBCR
40.0000 meq | EXTENDED_RELEASE_TABLET | Freq: Once | ORAL | Status: AC
  Administered 2018-02-02: 40 meq via ORAL
  Filled 2018-02-02: qty 2

## 2018-02-02 MED ORDER — MIDODRINE HCL 5 MG PO TABS
5.0000 mg | ORAL_TABLET | Freq: Three times a day (TID) | ORAL | Status: DC
  Administered 2018-02-02 – 2018-02-04 (×6): 5 mg via ORAL
  Filled 2018-02-02 (×6): qty 1

## 2018-02-02 MED ORDER — ORAL CARE MOUTH RINSE
15.0000 mL | Freq: Two times a day (BID) | OROMUCOSAL | Status: DC
  Administered 2018-02-03 (×2): 15 mL via OROMUCOSAL

## 2018-02-02 MED ORDER — POTASSIUM CHLORIDE 20 MEQ PO PACK: 40.0000 meq | PACK | Freq: Once | ORAL | Status: DC

## 2018-02-02 NOTE — Progress Notes (Addendum)
Advanced Heart Failure Rounding Note  PCP-Cardiologist: No primary care provider on file.   Subjective:    Milrinone stopped yesterday.  Norepinephrine was off but had to be restarted for low BPs. Now on NE 2.   Co-ox 76% SBP 90s. CVP 11-12  Lasix stopped 6/29.  Weight down to 169. (Baseline weight 183-189). Creatinine 4.3-> 4.1-> 4.2->4.1>3.8  Feels weaker today. No appetite. Feels like something stuck in her chest.  Denies CP, SOB, orthopnea or PND.    Objective:   Weight Range: 77.1 kg (169 lb 15.6 oz) Body mass index is 29.18 kg/m.   Vital Signs:   Temp:  [97.2 F (36.2 C)-98.4 F (36.9 C)] 98.2 F (36.8 C) (06/30 0737) Pulse Rate:  [42-116] 79 (06/30 0900) Resp:  [13-31] 31 (06/30 0900) BP: (83-131)/(50-85) 83/50 (06/30 0900) SpO2:  [89 %-100 %] 98 % (06/30 0900) Weight:  [77.1 kg (169 lb 15.6 oz)] 77.1 kg (169 lb 15.6 oz) (06/30 0500) Last BM Date: 02/01/18  Weight change: Filed Weights   01/31/18 0500 02/01/18 0600 02/02/18 0500  Weight: 79 kg (174 lb 1 oz) 78.4 kg (172 lb 13.5 oz) 77.1 kg (169 lb 15.6 oz)    Intake/Output:   Intake/Output Summary (Last 24 hours) at 02/02/2018 1108 Last data filed at 02/02/2018 0900 Gross per 24 hour  Intake 1012.81 ml  Output 1450 ml  Net -437.19 ml      Physical Exam    CVP 12 General:  Elderly woman. Sitting in chair. Weak NAD HEENT: normal Neck: supple. JVP 12. LIJ TLC Carotids 2+ bilat; no bruits. No lymphadenopathy or thryomegaly appreciated. Cor: PMI nondisplaced. IRR. 2/6 TR prominent RV lift Lungs: clear Abdomen: soft, nontender, nondistended. No hepatosplenomegaly. No bruits or masses. Good bowel sounds. Extremities: no cyanosis, clubbing, rash, edema Neuro: alert & orientedx3, cranial nerves grossly intact. moves all 4 extremities w/o difficulty. Affect pleasant  Telemetry   Afib 70-80s. Personally reviewed  EKG    No new tracings.   Labs    CBC Recent Labs    01/30/18 1202  01/31/18 0358  WBC 10.3 10.9*  HGB 8.7* 8.1*  HCT 28.7* 27.0*  MCV 99.3 97.8  PLT 75* 50*   Basic Metabolic Panel Recent Labs    02/01/18 0343 02/02/18 0441  NA 136 140  K 3.8 3.5  CL 100 102  CO2 28 30  GLUCOSE 149* 86  BUN 90* 91*  CREATININE 4.10* 3.79*  CALCIUM 9.0 8.9   Liver Function Tests No results for input(s): AST, ALT, ALKPHOS, BILITOT, PROT, ALBUMIN in the last 72 hours. No results for input(s): LIPASE, AMYLASE in the last 72 hours. Cardiac Enzymes No results for input(s): CKTOTAL, CKMB, CKMBINDEX, TROPONINI in the last 72 hours.  BNP: BNP (last 3 results) Recent Labs    01/27/18 0953  BNP 732.8*    ProBNP (last 3 results) No results for input(s): PROBNP in the last 8760 hours.   D-Dimer No results for input(s): DDIMER in the last 72 hours. Hemoglobin A1C No results for input(s): HGBA1C in the last 72 hours. Fasting Lipid Panel No results for input(s): CHOL, HDL, LDLCALC, TRIG, CHOLHDL, LDLDIRECT in the last 72 hours. Thyroid Function Tests No results for input(s): TSH, T4TOTAL, T3FREE, THYROIDAB in the last 72 hours.  Invalid input(s): FREET3  Other results:   Imaging    No results found.   Medications:     Scheduled Medications: . ALPRAZolam  0.25 mg Oral QHS  . aspirin  81  mg Oral Daily  . atorvastatin  10 mg Oral Daily  . Chlorhexidine Gluconate Cloth  6 each Topical Daily  . feeding supplement (ENSURE ENLIVE)  237 mL Oral BID BM  . polyvinyl alcohol  1 drop Both Eyes QHS  . senna-docusate  1 tablet Oral QHS  . sodium chloride flush  10-40 mL Intracatheter Q12H    Infusions: . sodium chloride Stopped (02/01/18 1536)  . norepinephrine (LEVOPHED) Adult infusion 2 mcg/min (02/02/18 1011)    PRN Medications: acetaminophen, levalbuterol, ondansetron (ZOFRAN) IV, sodium chloride flush    Patient Profile   April Mueller is a64 y.o.with COPD on home oxygen, rheumatoid arthritis, chronic atrial fibrillation, chronic diastolic  CHF, Type A aortic dissection 2005,  GI bleed, anemia, CKD, OSA, and MR.    Admitted for mitral clip.  Assessment/Plan   1. Severe MR, S/P Mitral Clip Repair 1/61 - Complicated by respiratory failure  - Post-clip echo showed mild MR with mean gradient 5 mmHg. Severe TR with RV failure  2. Acute Hypoxic Respiratory Failure  - Urgent intubation 01/29/2018, extubated 6/27 - Stable on [place]  3. Acute/Diastolic Heart Failure - Volume status much improved. - Weight down well below baseline currently. Now off lasix. Keep off lasix - Echo 6/27: EF 60-65%, mild AS, mild MR, RA and LA atria severly dilated, severe TR, PA peak pressure 60 mmHg - CVP today 12 - RHC 3/17 CVP was 13 with PCWP 17 (in setting of severe MR) - Off milrinone. NE was off. Now back on NE for BP support. Will add midodrine - Co-ox 76%.  4. Chronic A fib  Rate controlled 70-80s She has not been on anticoagulants with history of GI Bleed.  5. 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 Now s/p MitraClip - Based on RHC 10/2015. No change.   6.AKI on CKD stage IV  - Creatinine baseline ~3  - Creatinine 3.8 this am. Continue to hold diuretics   7. H/O GI bleed  Extensive GI work up without evidence of bleeding.  - Hemoglobin not drawn this am but 7.5 on co-ox. Will recehck and get type and screen. Check iron stores.  - Continue SCDs.  8. Dysphagia - will follow. Can consider barium swallow as needed  8. Dispo - PT/OT consult. Will need SNF  Length of Stay: [place], MD  02/02/2018, 11:08 AM  Advanced Heart Failure Team Pager [phone] (M-F; 7a - 4p)  Please contact [facility] for night-coverage after hours (4p -7a ) and weekends on [email or web address]

## 2018-02-02 NOTE — Progress Notes (Signed)
Pt has home CPAP in the room.  RN comfortable with bleeding 3L of 02 when pt is ready for bed.  RT instructed RN to call if she had any questions.

## 2018-02-02 NOTE — Progress Notes (Signed)
Patient ambulated in hall with black rolling walker and maximum steadying assist from RN. Patient could not seem to walk straight, she kept wanting to lean to the right side. Pace was good, but would not have been able to stand up if RN wasn't supporting her. No complaints of SOB or DOE, oxygen at 3L for the walk instead of the 1L at rest. PT has already evaluated patient and will continue to follow and plan for SNF at discharge.

## 2018-02-03 DIAGNOSIS — I5033 Acute on chronic diastolic (congestive) heart failure: Secondary | ICD-10-CM

## 2018-02-03 LAB — BASIC METABOLIC PANEL
Anion gap: 9 (ref 5–15)
BUN: 90 mg/dL — AB (ref 8–23)
CHLORIDE: 102 mmol/L (ref 98–111)
CO2: 30 mmol/L (ref 22–32)
Calcium: 9.3 mg/dL (ref 8.9–10.3)
Creatinine, Ser: 3.28 mg/dL — ABNORMAL HIGH (ref 0.44–1.00)
GFR calc Af Amer: 15 mL/min — ABNORMAL LOW (ref >60)
GFR, EST NON AFRICAN AMERICAN: 13 mL/min — AB (ref >60)
Glucose, Bld: 92 mg/dL (ref 70–99)
POTASSIUM: 3.4 mmol/L — AB (ref 3.5–5.1)
SODIUM: 141 mmol/L (ref 135–145)

## 2018-02-03 LAB — COOXEMETRY PANEL
Carboxyhemoglobin: 2.1 % — ABNORMAL HIGH (ref 0.5–1.5)
METHEMOGLOBIN: 1 % (ref 0.0–1.5)
O2 Saturation: 75.3 %
TOTAL HEMOGLOBIN: 13.9 g/dL (ref 12.0–16.0)

## 2018-02-03 LAB — CBC
HCT: 25.8 % — ABNORMAL LOW (ref 36.0–46.0)
HEMOGLOBIN: 7.8 g/dL — AB (ref 12.0–15.0)
MCH: 29.4 pg (ref 26.0–34.0)
MCHC: 30.2 g/dL (ref 30.0–36.0)
MCV: 97.4 fL (ref 78.0–100.0)
PLATELETS: 52 10*3/uL — AB (ref 150–400)
RBC: 2.65 MIL/uL — ABNORMAL LOW (ref 3.87–5.11)
RDW: 15.1 % (ref 11.5–15.5)
WBC: 7.9 10*3/uL (ref 4.0–10.5)

## 2018-02-03 MED ORDER — SODIUM CHLORIDE 0.9 % IV SOLN
510.0000 mg | Freq: Once | INTRAVENOUS | Status: AC
  Administered 2018-02-03: 510 mg via INTRAVENOUS
  Filled 2018-02-03: qty 17

## 2018-02-03 MED ORDER — POTASSIUM CHLORIDE CRYS ER 20 MEQ PO TBCR
20.0000 meq | EXTENDED_RELEASE_TABLET | Freq: Once | ORAL | Status: AC
  Administered 2018-02-03: 20 meq via ORAL
  Filled 2018-02-03: qty 1

## 2018-02-03 MED ORDER — TORSEMIDE 20 MG PO TABS
80.0000 mg | ORAL_TABLET | Freq: Every day | ORAL | Status: DC
  Administered 2018-02-03 – 2018-02-04 (×2): 80 mg via ORAL
  Filled 2018-02-03 (×3): qty 4

## 2018-02-03 MED ORDER — DARBEPOETIN ALFA 40 MCG/0.4ML IJ SOSY
40.0000 ug | PREFILLED_SYRINGE | INTRAMUSCULAR | Status: DC
  Filled 2018-02-03: qty 0.4

## 2018-02-03 NOTE — NC FL2 (Signed)
[place] LEVEL OF CARE SCREENING TOOL     IDENTIFICATION  Patient Name: April Mueller Birthdate: 1945/09/22 Sex: female Admission Date (Current Location): 01/29/2018  [facility] and [place] Number:  Herbalist and Address:  [facility], [place] [address] [place], [place] [address]      Provider Number: [id number]  Attending Physician Name and Address:  Jolaine Artist, MD  Relative Name and Phone Number:       Current Level of Care: Hospital Recommended Level of Care: [place] Prior Approval Number:    Date Approved/Denied:   PASRR Number: [id number]  Discharge Plan: SNF    Current Diagnoses: Patient Active Problem List   Diagnosis Date Noted  . AKI (acute kidney injury) ([place])   . Chronic atrial fibrillation ([place])   . Hypoxemia   . Acute respiratory failure with hypoxemia ([place])   . Respiratory failure ([place])   . Acute pulmonary edema (HCC)   . Acute on chronic systolic heart failure ([place]) 01/29/2018  . Severe mitral regurgitation 01/29/2018  . S/P aortic dissection repair   . Chronic thoracic aortic dissection (HCC)   . PAF (paroxysmal atrial fibrillation) ([place]) 11/06/2016  . Sepsis due to Escherichia coli ([place])   . Sepsis ([place]) 07/12/2016  . Thrombocytopenia ([place]) 07/12/2016  . Urinary tract infection with hematuria   . Fever   . Hypotension   . Iron deficiency anemia due to chronic blood loss 11/03/2015  . Chronic diastolic CHF (congestive heart failure) ([place]) 09/26/2015  . Persistent atrial fibrillation ([place])   . History of colonic polyps   . Benign neoplasm of cecum   . Benign neoplasm of ascending colon   . Heme positive stool   . Anemia 08/17/2015  . Symptomatic anemia 08/16/2015  . HTN (hypertension) 08/16/2015  . Leukocytosis 08/16/2015  . Acute hyperkalemia 08/16/2015  . Myelodysplastic syndrome ([place]) 08/16/2015  . Anemia of chronic disease 04/26/2015  . CKD (chronic kidney  disease) stage 3, GFR 30-59 ml/min (HCC) 04/22/2015  . Normocytic anemia 04/04/2015  . Morbid obesity ([place]) 01/22/2015  . Mitral regurgitation 12/29/2014  . Dyspnea 08/18/2014  . Chronic respiratory failure with hypoxia ([place]) 08/09/2014  . 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 Rheumatoid arthritis ([place]) 11/06/2012  . COPD with acute exacerbation ([place]) 11/06/2012  . OSA on CPAP 11/06/2012    Orientation RESPIRATION BLADDER Height & Weight     Self, Time, Situation, Place  O2(2L [place]) Incontinent, External catheter Weight: 171 lb 1.2 oz (77.6 kg) Height:  5\' 4"  (162.6 cm)  BEHAVIORAL SYMPTOMS/MOOD NEUROLOGICAL BOWEL NUTRITION STATUS      Continent Diet(cardiac)  AMBULATORY STATUS COMMUNICATION OF NEEDS Skin   Limited Assist Verbally Normal                       Personal Care Assistance Level of Assistance  Bathing, Dressing Bathing Assistance: Limited assistance   Dressing Assistance: Limited assistance     Functional Limitations Info             SPECIAL CARE FACTORS FREQUENCY  PT (By licensed PT), OT (By licensed OT)     PT Frequency: 5/wk OT Frequency: 5/wk            Contractures      Additional Factors Info  Code Status, Allergies Code Status Info: FULL Allergies Info: NKA           Current Medications (02/03/2018):  This is  the current hospital active medication list Current Facility-Administered Medications  Medication Dose Route Frequency Provider Last Rate Last Dose  . 0.9 %  sodium chloride infusion   Intravenous Continuous Sherren Mocha, MD   Stopped at 02/01/18 1536  . acetaminophen (TYLENOL) tablet 1,000 mg  1,000 mg Oral Q6H PRN Sherren Mocha, MD      . ALPRAZolam Duanne Moron) tablet 0.25 mg  0.25 mg Oral QHS Colbert Coyer, MD   0.25 mg at 02/02/18 2205  . aspirin chewable tablet 81 mg  81 mg Oral Daily Sherren Mocha, MD   81 mg at 02/03/18 0954  . atorvastatin (LIPITOR) tablet 10 mg  10 mg Oral Daily  Sherren Mocha, MD   10 mg at 02/03/18 0953  . chlorhexidine (PERIDEX) 0.12 % solution 15 mL  15 mL Mouth Rinse BID Bensimhon, Shaune Pascal, MD   15 mL at 02/03/18 0952  . Chlorhexidine Gluconate Cloth 2 % PADS 6 each  6 each Topical Daily Bensimhon, Shaune Pascal, MD   6 each at 02/02/18 1012  . Darbepoetin Alfa (ARANESP) injection 40 mcg  40 mcg Subcutaneous Q Mon-1800 Shirley Friar, PA-C      . feeding supplement (ENSURE ENLIVE) (ENSURE ENLIVE) liquid 237 mL  237 mL Oral BID BM Bensimhon, Shaune Pascal, MD   237 mL at 02/03/18 1002  . gi cocktail (Maalox,Lidocaine,Donnatal)  30 mL Oral TID PRN Pixie Casino, MD   30 mL at 02/03/18 0901  . levalbuterol (XOPENEX) nebulizer solution 0.63 mg  0.63 mg Nebulization Q3H PRN Corey Harold, NP      . MEDLINE mouth rinse  15 mL Mouth Rinse q12n4p Bensimhon, Shaune Pascal, MD      . midodrine (PROAMATINE) tablet 5 mg  5 mg Oral TID WC Bensimhon, Shaune Pascal, MD   5 mg at 02/03/18 0859  . norepinephrine (LEVOPHED) 4mg  in D5W 245mL premix infusion  0-40 mcg/min Intravenous Titrated Darlina Sicilian A, NP 3.8 mL/hr at 02/02/18 2000 1 mcg/min at 02/02/18 2000  . ondansetron (ZOFRAN) injection 4 mg  4 mg Intravenous Q6H PRN Sherren Mocha, MD      . polyvinyl alcohol (LIQUIFILM TEARS) 1.4 % ophthalmic solution 1 drop  1 drop Both Eyes QHS Sherren Mocha, MD   1 drop at 02/02/18 2205  . senna-docusate (Senokot-S) tablet 1 tablet  1 tablet Oral QHS Bensimhon, Daniel R, MD      . sodium chloride flush (NS) 0.9 % injection 10-40 mL  10-40 mL Intracatheter Q12H Bensimhon, Shaune Pascal, MD   10 mL at 02/03/18 0911  . sodium chloride flush (NS) 0.9 % injection 10-40 mL  10-40 mL Intracatheter PRN Bensimhon, Shaune Pascal, MD      . torsemide (DEMADEX) tablet 80 mg  80 mg Oral Daily Shirley Friar, PA-C   80 mg at 02/03/18 9528     Discharge Medications: Please see discharge summary for a list of discharge medications.  Relevant Imaging Results:  Relevant Lab  Results:   Additional Information SS#: [id number]  Jorge Ny, LCSW

## 2018-02-03 NOTE — Progress Notes (Signed)
CSW met with pt and discussed PT recommendation for SNF.  Pt had originally thought she might go but now that she is feeling better would much rather return home- states she has been to SNF in the past and found it very depressing.  At this time she is choosing to go with home health services- has 2 different aids that come during the day to assist with housekeeping, cooking, transportation to appts etc.  Pt also with neighbor who was at bedside- neighbor states she is available in the evenings when pt would not have aid assistance and could help as needed  Pt is agreeable to home health services and interested in assistive devices- RNCM informed and will follow up regarding agency choices  CSW signing off  Jorge Ny, [place] Social Worker [phone]

## 2018-02-03 NOTE — Progress Notes (Addendum)
Advanced Heart Failure Rounding Note  PCP-Cardiologist: No primary care provider on file.   Subjective:    Milrinone stopped yesterday.  Norepinephrine was off but had to be restarted for low BPs. Now on NE 2.   Co-ox 75.3% this am. Off Norepi. SBP 90-100s CVP 10.  Feeling OK this am. Remains tired and fatigued. Still having palpitations. Denies SOB or pain.  Dysphagia improved with GI cocktail.   Lasix stopped 6/29.  Weight up to 171 this am. Creatinine 4.3 -> 4.1 -> 4.2 -> 4.1-> 3.8 -> 3.2.   Objective:   Weight Range: 171 lb 1.2 oz (77.6 kg) Body mass index is 29.37 kg/m.   Vital Signs:   Temp:  [98.2 F (36.8 C)-98.6 F (37 C)] 98.5 F (36.9 C) (07/01 0000) Pulse Rate:  [65-84] 76 (07/01 0700) Resp:  [20-35] 20 (07/01 0700) BP: (68-139)/(45-80) 104/58 (07/01 0700) SpO2:  [68 %-100 %] 100 % (07/01 0700) Weight:  [171 lb 1.2 oz (77.6 kg)] 171 lb 1.2 oz (77.6 kg) (07/01 0630) Last BM Date: 02/02/18  Weight change: Filed Weights   02/01/18 0600 02/02/18 0500 02/03/18 0630  Weight: 172 lb 13.5 oz (78.4 kg) 169 lb 15.6 oz (77.1 kg) 171 lb 1.2 oz (77.6 kg)    Intake/Output:   Intake/Output Summary (Last 24 hours) at 02/03/2018 0735 Last data filed at 02/03/2018 0700 Gross per 24 hour  Intake 1077.61 ml  Output 1100 ml  Net -22.39 ml      Physical Exam    CVP 10  General: Elderly. Weak. NAD. HEENT: Normal Neck: Supple. JVP 9-10. LIJ TLC. Carotids 2+ bilat; no bruits. No thyromegaly or nodule noted. Cor: PMI nondisplaced. IRR, 2/6 TR. Prominent RV lift.  Lungs: CTAB, normal effort. Abdomen: Soft, non-tender, non-distended, no HSM. No bruits or masses. +BS  Extremities: No cyanosis, clubbing, or rash. R and LLE no edema.  Neuro: Alert & orientedx3, cranial nerves grossly intact. moves all 4 extremities w/o difficulty. Affect pleasant   Telemetry   Afib 70-80s, personally reviewed.   EKG    No new tracings.    Labs    CBC Recent Labs   02/02/18 2356  WBC 7.9  HGB 7.8*  HCT 25.8*  MCV 97.4  PLT 52*   Basic Metabolic Panel Recent Labs    02/02/18 0441 02/03/18 0524  NA 140 141  K 3.5 3.4*  CL 102 102  CO2 30 30  GLUCOSE 86 92  BUN 91* 90*  CREATININE 3.79* 3.28*  CALCIUM 8.9 9.3   Liver Function Tests No results for input(s): AST, ALT, ALKPHOS, BILITOT, PROT, ALBUMIN in the last 72 hours. No results for input(s): LIPASE, AMYLASE in the last 72 hours. Cardiac Enzymes No results for input(s): CKTOTAL, CKMB, CKMBINDEX, TROPONINI in the last 72 hours.  BNP: BNP (last 3 results) Recent Labs    01/27/18 0953  BNP 732.8*    ProBNP (last 3 results) No results for input(s): PROBNP in the last 8760 hours.   D-Dimer No results for input(s): DDIMER in the last 72 hours. Hemoglobin A1C No results for input(s): HGBA1C in the last 72 hours. Fasting Lipid Panel No results for input(s): CHOL, HDL, LDLCALC, TRIG, CHOLHDL, LDLDIRECT in the last 72 hours. Thyroid Function Tests No results for input(s): TSH, T4TOTAL, T3FREE, THYROIDAB in the last 72 hours.  Invalid input(s): FREET3  Other results:   Imaging    Dg Chest Port 1 View  Result Date: 02/02/2018 CLINICAL DATA:  Difficulty swallowing  EXAM: PORTABLE CHEST 1 VIEW COMPARISON:  January 30, 2018 FINDINGS: A left central line is stable. The ET and NG tubes have been removed. Stable cardiomegaly. The hila and mediastinum are unchanged. No pneumothorax. Scar or atelectasis in the right base. Probable tiny right pleural effusion. No suspicious infiltrate. No other acute abnormalities. IMPRESSION: 1. Support apparatus as above. Probable tiny right pleural effusion. No other interval change. Electronically Signed   By: Dorise Bullion III M.D   On: 02/02/2018 16:51     Medications:     Scheduled Medications: . ALPRAZolam  0.25 mg Oral QHS  . aspirin  81 mg Oral Daily  . atorvastatin  10 mg Oral Daily  . chlorhexidine  15 mL Mouth Rinse BID  .  Chlorhexidine Gluconate Cloth  6 each Topical Daily  . feeding supplement (ENSURE ENLIVE)  237 mL Oral BID BM  . mouth rinse  15 mL Mouth Rinse q12n4p  . midodrine  5 mg Oral TID WC  . polyvinyl alcohol  1 drop Both Eyes QHS  . senna-docusate  1 tablet Oral QHS  . sodium chloride flush  10-40 mL Intracatheter Q12H    Infusions: . sodium chloride Stopped (02/01/18 1536)  . norepinephrine (LEVOPHED) Adult infusion 1 mcg/min (02/02/18 2000)    PRN Medications: acetaminophen, gi cocktail, levalbuterol, ondansetron (ZOFRAN) IV, sodium chloride flush    Patient Profile   April Mueller is a89 y.o.with COPD on home oxygen, rheumatoid arthritis, chronic atrial fibrillation, chronic diastolic CHF, Type A aortic dissection 2005,  GI bleed, anemia, CKD, OSA, and MR.    Admitted for mitral clip.  Assessment/Plan   1. Severe MR, S/P Mitral Clip Repair 9/32 - Complicated by respiratory failure  - Post-clip echo showed mild MR with mean gradient 5 mmHg. Severe TR with RV failure - No change to current plan.    2. Acute Hypoxic Respiratory Failure  - Urgent intubation 01/29/2018, extubated 6/27 - Now stable on [place].   3. Acute/Diastolic Heart Failure - Echo 6/27: EF 60-65%, mild AS, mild MR, RA and LA atria severly dilated, severe TR, PA peak pressure 60 mmHg - RHC 3/17 CVP was 13 with PCWP 17 (in setting of severe MR) - Volume status stable to trending up. Resume torsemide 80 mg daily.  - Coox 75.3%. Off milrinone and NE. - Weight below perceived baseline currently.  - CVP 9-10. May be able to resume po diuretics.   4. Chronic A fib  - Rate controlled. Chronic. - She has not been on anticoagulants with history of GI Bleed.  5. 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 Now s/p MitraClip - Based on RHC 10/2015. No change.   6.AKI on CKD stage IV  - Creatinine baseline ~3  - Creatinine down to 3.2 this am. Holding diuretics.   7. H/O GI bleed  Extensive GI work up  without evidence of bleeding.  - Hemoglobin 7.8.  - Iron stores low. Will give IV iron.  - Continue SCDs.  8. Dysphagia - Follow.  Can consider barium swallow as needed - Improved with GI cocktail.  8. Dispo - PT/OT consult. Will need SNF  Length of Stay: 5  Annamaria Helling  02/03/2018, 7:35 AM  Advanced Heart Failure Team Pager [phone] (M-F; 7a - 4p)  Please contact [facility] for night-coverage after hours (4p -7a ) and weekends on [email or web address]   Patient seen and examined with the above-signed Advanced Practice Provider and/or Housestaff. I personally reviewed laboratory data, imaging studies  and relevant notes. I independently examined the patient and formulated the important aspects of the plan. I have edited the note to reflect any of my changes or salient points. I have personally discussed the plan with the patient and/or family.  She looks much better today. Chest pain/dysphagia resolved. CVP down to 10. Creatinine improving. BP stable off inotropes.  Rhythm stable.   On exam  CVP 10 LIJ TLC Cor RRR 2/6 TR with RV lift No MR heard Lungs CTA Ab NT ND Ext no edema  [facility]. Hopefully to SNF tomorrow or Wednesday. Getting iron today. Will give Aranesp as well. D/w PharmD.   Glori Bickers, MD  9:19 AM

## 2018-02-03 NOTE — Progress Notes (Signed)
RT set up patient's home CPAP with 2L O2 bled into circuit. Patient placed herself on and is tolerating well at this time. RT will monitor as needed.

## 2018-02-03 NOTE — Care Management Note (Signed)
Case Management Note Marvetta Gibbons RN,BSN [facility] Case Manager  [phone]  Patient Details  Name: April Mueller MRN: [id number] Date of Birth: September 01, 1945  Subjective/Objective:  Pt admitted with severe MR, s/p Mitral clip repair on 2/35/57 complicated by respiratory failure now stable off vent.  Acute HF               Action/Plan: PTA Pt lived at home alone, is on 3L home 02 at all times. Per PT/OT evals recommendations for STSNF, CSW following for placement needs.   Expected Discharge Date:                  Expected Discharge Plan:  Skilled Nursing Facility  In-House Referral:  Clinical Social Work  Discharge planning Services  CM Consult  Post Acute Care Choice:    Choice offered to:     DME Arranged:    DME Agency:     HH Arranged:    [place] Agency:     Status of Service:  In process, will continue to follow  If discussed at Long Length of Stay Meetings, dates discussed:    Discharge Disposition:   Additional Comments:  Dawayne Patricia, RN 02/03/2018, 10:50 AM

## 2018-02-03 NOTE — Progress Notes (Signed)
Physical Therapy Treatment Patient Details Name: April Mueller MRN: [id number] DOB: 08/18/1945 Today's Date: 02/03/2018    History of Present Illness 72 y.o. admitted for mini MVR with acute respiratory failure VDRF 6/26-6/27. PMHx: COPD on home oxygen, rheumatoid arthritis, chronic Afib, chronic diastolic CHF, Type A aortic dissection 2005,  GI bleed, anemia, CKD, OSA, and MR    PT Comments    Pt very pleasant with increased mobility and demeanor from last week.  PT currently walking the unit, performing transfers without physical assist and maintaining SpO2 on 3L with gait and 2L at rest. HR 79-90 during gait with pt requiring several standing rest breaks but walking further now than pre-operatively. Pt has aide at home and very high bed with recommendation to remove box spring to ease transition onto and off of bed for home. Pt agreeable to change in D/C plan and would benefit from increased aide hours if possible to continue to assist with homemaking.    Follow Up Recommendations  Home health PT;Supervision - Intermittent     Equipment Recommendations  3in1 (PT)    Recommendations for Other Services       Precautions / Restrictions Precautions Precautions: Fall Precaution Comments: watch sats    Mobility  Bed Mobility Overal bed mobility: Needs Assistance Bed Mobility: Supine to Sit;Sit to Supine     Supine to sit: Modified independent (Device/Increase time) Sit to supine: Min assist   General bed mobility comments: HOB flat with assist to bring legs onto surface of high bed simulating home, pt able to exit bed without assist  Transfers Overall transfer level: Modified independent               General transfer comment: pt able to stand from bed without assist  Ambulation/Gait Ambulation/Gait assistance: Supervision Gait Distance (Feet): 400 Feet Assistive device: Rolling walker (2 wheeled) Gait Pattern/deviations: Step-through pattern;Decreased stride  length;Trunk flexed   Gait velocity interpretation: >2.62 ft/sec, indicative of community ambulatory General Gait Details: pt able to control and direct rW throughout gait, pt with grossly 4 standing rest breaks with cues for breathing technique and decreased gait speed to increase energy conservation. Pt required 3L with gait with SpO2 90-95%    Stairs             Wheelchair Mobility    Modified Rankin (Stroke Patients Only)       Balance Overall balance assessment: Needs assistance Sitting-balance support: Feet supported Sitting balance-Leahy Scale: Good     Standing balance support: Bilateral upper extremity supported Standing balance-Leahy Scale: Poor                              Cognition Arousal/Alertness: Awake/alert Behavior During Therapy: WFL for tasks assessed/performed Overall Cognitive Status: Within Functional Limits for tasks assessed                                        Exercises      General Comments        Pertinent Vitals/Pain Pain Assessment: No/denies pain    Home Living                      Prior Function            PT Goals (current goals can now be found in the care plan section) Progress  towards PT goals: Progressing toward goals    Frequency           PT Plan Discharge plan needs to be updated    Co-evaluation              AM-PAC PT "6 Clicks" Daily Activity  Outcome Measure  Difficulty turning over in bed (including adjusting bedclothes, sheets and blankets)?: None Difficulty moving from lying on back to sitting on the side of the bed? : None Difficulty sitting down on and standing up from a chair with arms (e.g., wheelchair, bedside commode, etc,.)?: A Little Help needed moving to and from a bed to chair (including a wheelchair)?: A Little Help needed walking in hospital room?: A Little Help needed climbing 3-5 steps with a railing? : A Little 6 Click Score: 20     End of Session Equipment Utilized During Treatment: Gait belt;Oxygen Activity Tolerance: Patient tolerated treatment well Patient left: in bed;with call bell/phone within reach;with nursing/sitter in room Nurse Communication: Mobility status PT Visit Diagnosis: Other abnormalities of gait and mobility (R26.89);Muscle weakness (generalized) (M62.81)     Time: 1320-1350 PT Time Calculation (min) (ACUTE ONLY): 30 min  Charges:  $Gait Training: 8-22 mins $Therapeutic Activity: 8-22 mins                    G Codes:       Elwyn Reach, PT [phone]    [place] 02/03/2018, 2:09 PM

## 2018-02-03 NOTE — Progress Notes (Signed)
Occupational Therapy Treatment Patient Details Name: April Mueller MRN: [id number] DOB: [birth year] Today's Date: 02/03/2018    History of present illness 72 y.o. admitted for mini MVR with acute respiratory failure VDRF 6/26-6/27. PMHx: COPD on home oxygen, rheumatoid arthritis, chronic Afib, chronic diastolic CHF, Type A aortic dissection 2005,  GI bleed, anemia, CKD, OSA, and MR   OT comments  Pt progressing towards established OT goals. Pt performing toileting, grooming and home distance functional mobility with Min Guard A for safety. SpO2 90s on 2 L O2 throughout session. Pt denies any SOB. Due to pt progress, update dc recommendation to home with [place] [facility] [place] aides. Will continue to follow acutely as admitted to facilitate safe dc.   Follow Up Recommendations  Supervision/Assistance - 24 hour;Home health OT    Equipment Recommendations  Tub/shower seat    Recommendations for Other Services      Precautions / Restrictions Precautions Precautions: Fall Precaution Comments: watch sats Restrictions Weight Bearing Restrictions: No       Mobility Bed Mobility Overal bed mobility: Needs Assistance Bed Mobility: Supine to Sit;Sit to Supine     Supine to sit: Modified independent (Device/Increase time) Sit to supine: Min assist   General bed mobility comments: In recliner upon arrival  Transfers Overall transfer level: Needs assistance Equipment used: Rolling walker (2 wheeled) Transfers: Sit to/from [facility] Sit to Stand: Min guard Stand pivot transfers: Min guard       General transfer comment: Min Guard A for safety    Balance Overall balance assessment: Needs assistance Sitting-balance support: Feet supported Sitting balance-Leahy Scale: Good     Standing balance support: Bilateral upper extremity supported Standing balance-Leahy Scale: Fair Standing balance comment: Able to maintain static standing without UE support at sink                            ADL either performed or assessed with clinical judgement   ADL Overall ADL's : Needs assistance/impaired     Grooming: Wash/dry hands;Min Dispensing optician: Min guard;Ambulation;Regular Toilet;RW Toilet Transfer Details (indicate cue type and reason): MIn GUard for safety during toilet transfer to regular height toilet Toileting- Clothing Manipulation and Hygiene: Min guard;Sit to/from stand Toileting - Clothing Manipulation Details (indicate cue type and reason): Min Guard A for safety during toilet hygiene.     Functional mobility during ADLs: Min guard;Rolling walker General ADL Comments: Pt demonstrating increased activity tolerance. Pt performing toileting, hand hygiene, and hall way distance functional mobility with MIn Guard A for safety. SpO2 staying in 90s on 2L O2.      Vision       Perception     Praxis      Cognition Arousal/Alertness: Awake/alert Behavior During Therapy: WFL for tasks assessed/performed Overall Cognitive Status: Within Functional Limits for tasks assessed                                          Exercises     Shoulder Instructions       General Comments VSS throughout session    Pertinent Vitals/ Pain       Pain Assessment: No/denies pain  Home Living  Prior Functioning/Environment              Frequency  Min 2X/week        Progress Toward Goals  OT Goals(current goals can now be found in the care plan section)  Progress towards OT goals: Progressing toward goals  Acute Rehab OT Goals Patient Stated Goal: To take care of my house  OT Goal Formulation: With patient Time For Goal Achievement: 02/14/18 Potential to Achieve Goals: Good ADL Goals Pt Will Perform Grooming: with modified independence;standing Pt Will Perform Upper Body Bathing: with modified independence;standing Pt  Will Perform Lower Body Bathing: with modified independence;sit to/from stand Pt Will Perform Upper Body Dressing: with modified independence;sitting;standing Pt Will Perform Lower Body Dressing: with modified independence;sit to/from stand Pt Will Transfer to Toilet: with modified independence;ambulating;regular height toilet;bedside commode;grab bars Pt Will Perform Toileting - Clothing Manipulation and hygiene: with modified independence;sit to/from stand Pt Will Perform Tub/Shower Transfer: Tub transfer;Shower transfer;ambulating;shower seat;rolling walker;grab bars  Plan Discharge plan needs to be updated    Co-evaluation                 AM-PAC PT "6 Clicks" Daily Activity     Outcome Measure   Help from another person eating meals?: None Help from another person taking care of personal grooming?: A Little Help from another person toileting, which includes using toliet, bedpan, or urinal?: A Little Help from another person bathing (including washing, rinsing, drying)?: A Little Help from another person to put on and taking off regular upper body clothing?: A Little Help from another person to put on and taking off regular lower body clothing?: A Little 6 Click Score: 19    End of Session Equipment Utilized During Treatment: Rolling walker;Gait belt;Oxygen  OT Visit Diagnosis: Unsteadiness on feet (R26.81)   Activity Tolerance Patient tolerated treatment well   Patient Left in chair;with call bell/phone within reach   Nurse Communication Mobility status        Time: 4403-4742 OT Time Calculation (min): 39 min  Charges: OT General Charges $OT Visit: 1 Visit OT Treatments $Self Care/Home Management : 38-52 mins  [place], OTR/L Acute Rehab Pager: [phone] Office: [place] 02/03/2018, 3:18 PM

## 2018-02-04 LAB — BASIC METABOLIC PANEL
ANION GAP: 6 (ref 5–15)
BUN: 92 mg/dL — ABNORMAL HIGH (ref 8–23)
CO2: 30 mmol/L (ref 22–32)
Calcium: 9.1 mg/dL (ref 8.9–10.3)
Chloride: 103 mmol/L (ref 98–111)
Creatinine, Ser: 3.02 mg/dL — ABNORMAL HIGH (ref 0.44–1.00)
GFR calc non Af Amer: 15 mL/min — ABNORMAL LOW (ref >60)
GFR, EST AFRICAN AMERICAN: 17 mL/min — AB (ref >60)
GLUCOSE: 125 mg/dL — AB (ref 70–99)
Potassium: 3.3 mmol/L — ABNORMAL LOW (ref 3.5–5.1)
SODIUM: 139 mmol/L (ref 135–145)

## 2018-02-04 LAB — COOXEMETRY PANEL
Carboxyhemoglobin: 1.5 % (ref 0.5–1.5)
Methemoglobin: 1.9 % — ABNORMAL HIGH (ref 0.0–1.5)
O2 Saturation: 71.8 %
Total hemoglobin: 12.2 g/dL (ref 12.0–16.0)

## 2018-02-04 MED ORDER — POTASSIUM CHLORIDE CRYS ER 20 MEQ PO TBCR
40.0000 meq | EXTENDED_RELEASE_TABLET | Freq: Once | ORAL | Status: AC
  Administered 2018-02-04: 40 meq via ORAL
  Filled 2018-02-04: qty 2

## 2018-02-04 MED ORDER — CHLORHEXIDINE GLUCONATE CLOTH 2 % EX PADS
6.0000 | MEDICATED_PAD | Freq: Every day | CUTANEOUS | Status: DC
  Administered 2018-02-05 – 2018-02-06 (×2): 6 via TOPICAL

## 2018-02-04 MED ORDER — DARBEPOETIN ALFA 40 MCG/0.4ML IJ SOSY
40.0000 ug | PREFILLED_SYRINGE | INTRAMUSCULAR | Status: DC
  Administered 2018-02-04: 40 ug via SUBCUTANEOUS
  Filled 2018-02-04: qty 0.4

## 2018-02-04 NOTE — Care Management (Addendum)
02/04/2018  Update:  Bedside nurse walked.   PT to reassess pt for safe discharge.  CSW will also meet with pt to determine if SNF is a possibility.  CM explained to pt that if SNF is not deemed necessary/deemed per CSW to not  and pt is unable to pay out of pocket; discharge plan would be for home with [facility].  Pt chose [facility] - agency will be contacted once orders are received.  Pt is interested in PT, RN for home health.  Pt is also interested in RW - orders also needed.  Pt educated on importance of daily weights and low sodium intake.  Para medicine team to follow pt at discharge.  Pt confirmed that family can bring portable tank to hospital for discharge home via private vehicle.  CM requested HH/DME orders  Pt is from home alone with [facility] aide coming into the home 3 days a week for approximately 2 hours a day.  Pt is on 3-6 liters home oxygen supplied by [employer].  Pt changed her mind post speaking with family - pt interested in SNF as she doesn't fill safe at this point to discharge home. CSW made aware

## 2018-02-04 NOTE — Plan of Care (Signed)
  Problem: Education: Goal: Knowledge of General Education information will improve Outcome: Progressing   Problem: Health Behavior/Discharge Planning: Goal: Ability to manage health-related needs will improve Outcome: Progressing   Problem: Clinical Measurements: Goal: Ability to maintain clinical measurements within normal limits will improve Outcome: Progressing Goal: Will remain free from infection Outcome: Progressing Goal: Respiratory complications will improve Outcome: Progressing Goal: Cardiovascular complication will be avoided Outcome: Progressing   Problem: Activity: Goal: Risk for activity intolerance will decrease Outcome: Progressing   Problem: Nutrition: Goal: Adequate nutrition will be maintained Outcome: Progressing   Problem: Coping: Goal: Level of anxiety will decrease Outcome: Progressing   Problem: Elimination: Goal: Will not experience complications related to bowel motility Outcome: Progressing Goal: Will not experience complications related to urinary retention Outcome: Progressing   Problem: Safety: Goal: Ability to remain free from injury will improve Outcome: Progressing   Problem: Skin Integrity: Goal: Risk for impaired skin integrity will decrease Outcome: Progressing   Problem: Activity: Goal: Ability to tolerate increased activity will improve Outcome: Progressing   Problem: Education: Goal: Knowledge of disease and its progression will improve Outcome: Progressing   Problem: Coping: Goal: Development of coping mechanisms to deal with changes in body function or appearance will improve Outcome: Progressing   Problem: Fluid Volume: Goal: Compliance with measures to maintain balanced fluid volume will improve Outcome: Progressing   Problem: Health Behavior/Discharge Planning: Goal: Ability to manage health-related needs will improve Outcome: Progressing   Problem: Nutritional: Goal: Ability to make healthy dietary choices  will improve Outcome: Progressing   Problem: Physical Regulation: Goal: Complications related to the disease process, condition or treatment will be avoided or minimized Outcome: Progressing   Problem: Education: Goal: Ability to demonstrate management of disease process will improve Outcome: Progressing Goal: Ability to verbalize understanding of medication therapies will improve Outcome: Progressing   Problem: Activity: Goal: Capacity to carry out activities will improve Outcome: Progressing   Problem: Cardiac: Goal: Ability to achieve and maintain adequate cardiopulmonary perfusion will improve Outcome: Progressing   Problem: Spiritual Needs Goal: Ability to function at adequate level Outcome: Progressing   Problem: Education: Goal: Knowledge of cardiac device and self-care will improve Outcome: Progressing Goal: Ability to safely manage health related needs after discharge will improve Outcome: Progressing   Problem: Cardiac: Goal: Ability to achieve and maintain adequate cardiopulmonary perfusion will improve Outcome: Progressing

## 2018-02-04 NOTE — Care Management Important Message (Signed)
Important Message  Patient Details  Name: April Mueller MRN: [id number] Date of Birth: 01-07-1946   Medicare Important Message Given:  Yes    Deatra Mcmahen P Yacine Droz 02/04/2018, 2:54 PM

## 2018-02-04 NOTE — Progress Notes (Signed)
Physical Therapy Treatment Patient Details Name: April Mueller MRN: [id number] DOB: [birth year] Today's Date: 02/04/2018    History of Present Illness 72 y.o. admitted for mini MVR with acute respiratory failure VDRF 6/26-6/27. PMHx: COPD on home oxygen, rheumatoid arthritis, chronic Afib, chronic diastolic CHF, Type A aortic dissection 2005,  GI bleed, anemia, CKD, OSA, and MR    PT Comments    Patient seems motivated to improve pushing herself to walk despite fatigue, leaning on walker and increased RR.  However, she is extremely fatigued with ambulation with assist in large hallway with cardiac monitoring and level tile surfaces.  It will be significantly more difficult for her to complete ADL's on her own despite having aide assist intermittently at home.  Even trips to the bathroom on her own will likely wear her out and increase her fall risk tremendously, despite the walker . It is likely basic ADL activities will be overwhelming to her and lead to another admission.  Feel she would benefit from SNF level rehab prior to d/c home to allow for increased amount of therapies (every day rather than twice weekly) and to improve her functional capacity before returning her home where she is alone much of the time.  PT to follow acutely.   Follow Up Recommendations  SNF;Supervision/Assistance - 24 hour     Equipment Recommendations  3in1 (PT)    Recommendations for Other Services       Precautions / Restrictions Precautions Precautions: Fall Precaution Comments: watch sats    Mobility  Bed Mobility Overal bed mobility: Needs Assistance         Sit to supine: Mod assist   General bed mobility comments: assist for legs onto bed  Transfers Overall transfer level: Needs assistance Equipment used: Rolling walker (2 wheeled) Transfers: Sit to/from Stand Sit to Stand: Min guard         General transfer comment: assist for balance   Ambulation/Gait Ambulation/Gait  assistance: Min guard;Min assist Gait Distance (Feet): 300 Feet Assistive device: Rolling walker (2 wheeled) Gait Pattern/deviations: Step-to pattern;Step-through pattern;Decreased stride length;Trunk flexed;Shuffle     General Gait Details: several stops to lean on walker and breathe and assist with back pain, RR up to 38, HR 85, SpO2 88-93% on 2L O2   Stairs             Wheelchair Mobility    Modified Rankin (Stroke Patients Only)       Balance Overall balance assessment: Needs assistance Sitting-balance support: Feet supported Sitting balance-Leahy Scale: Good     Standing balance support: Bilateral upper extremity supported Standing balance-Leahy Scale: Poor Standing balance comment: UE support for balance                            Cognition Arousal/Alertness: Awake/alert Behavior During Therapy: WFL for tasks assessed/performed Overall Cognitive Status: Within Functional Limits for tasks assessed                                        Exercises      General Comments General comments (skin integrity, edema, etc.): SpO2 dropping to 88% and RR up to 38      Pertinent Vitals/Pain Faces Pain Scale: Hurts even more Pain Location: back with upright activity Pain Descriptors / Indicators: Aching;Sharp Pain Intervention(s): Monitored during session;Repositioned;Limited activity within patient's tolerance  Home Living                      Prior Function            PT Goals (current goals can now be found in the care plan section) Progress towards PT goals: Not progressing toward goals - comment(seems weaker this session)    Frequency    Min 3X/week      PT Plan Discharge plan needs to be updated    Co-evaluation              AM-PAC PT "6 Clicks" Daily Activity  Outcome Measure  Difficulty turning over in bed (including adjusting bedclothes, sheets and blankets)?: A Little Difficulty moving from  lying on back to sitting on the side of the bed? : Unable Difficulty sitting down on and standing up from a chair with arms (e.g., wheelchair, bedside commode, etc,.)?: A Little Help needed moving to and from a bed to chair (including a wheelchair)?: A Little Help needed walking in hospital room?: A Little Help needed climbing 3-5 steps with a railing? : A Lot 6 Click Score: 15    End of Session Equipment Utilized During Treatment: Gait belt;Oxygen Activity Tolerance: Patient limited by fatigue Patient left: with call bell/phone within reach;in bed   PT Visit Diagnosis: Other abnormalities of gait and mobility (R26.89);Muscle weakness (generalized) (M62.81)     Time: 7322-0254 PT Time Calculation (min) (ACUTE ONLY): 24 min  Charges:  $Gait Training: 8-22 mins $Therapeutic Activity: 8-22 mins                    G CodesMagda Kiel, [place] [phone] 02/04/2018    Reginia Naas 02/04/2018, 6:40 PM

## 2018-02-04 NOTE — Progress Notes (Signed)
[id number] Pt walked with RN this afternoon. Gave pt low sodium handouts and encouraged 2000 mg restriction. Discussed CRP2 and pt stated she might like to do in a month or two. Will send referral to [facility] program. Will let PT evaluate for home plans. Graylon Good RN BSN 02/04/2018 2:36 PM

## 2018-02-04 NOTE — Progress Notes (Signed)
Patient has been referred to the [facility].  I have sent all appropriate paperwork to the Paramedic team.  [name] will follow with the [facility] after discharge.

## 2018-02-04 NOTE — Clinical Social Work Note (Addendum)
Per RN, PT to re-evaluate for SNF. CSW will follow.  Dayton Scrape, CSW [phone]  3:36 pm CSW met with patient to discuss SNF vs. HHPT. CSW explained that typically at 300 feet is when we tell patients they can go home with home health. Patient still wanted to try authorization and inquired about [facility]. They did not make a bed offer due to two expensive medications: Darbepoetin and Norepinephrine. CSW recognized patient and reviewed old notes. Patient went to [facility] in 2017 on an LOG. Patient has [employer] from [place] and although she has [place] SNF benefits, it could take up to 2 weeks to obtain insurance authorization. Patient notified and is agreeable to return home. Her son and daughter-in-law will be in town tomorrow. She reports having a very supportive neighbor. Her neighbor has her keys. RN and RNCMs aware.  CSW signing off. Consult again if any other social work needs arise.  Dayton Scrape, [place]

## 2018-02-04 NOTE — Discharge Summary (Addendum)
Advanced Heart Failure Discharge Note  Discharge Summary   Patient ID: April Mueller MRN: [id number], DOB/AGE: September 02, 1945 72 y.o. Admit date: 01/29/2018 D/C date:     02/07/2018   Primary Discharge Diagnoses:  1. Severe MR, s/p mitral clip repair 01/28/18 2. Acute hypoxic respiratory failure - Intubated 6/26-6/27 3. A/C diastolic HF 4. Chronic Afib 5. 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 AKI on CKD IV 7. Hx of GI bleed/Anemia 8. Dysphagia 9. Deconditioning  Hospital Course: April Mueller is a 72 y.o. female with a history of COPD on home oxygen, RA, chronic afib (no AC), chronic diastolic HF, Type A aortic dissection 2005, GIB, anemia, CKD, OSA, and severe MR.   She was admitted for mitra-clip with Dr Burt Knack on 6/26. She had very mild/trivial MR post clip. Post operatively she developed acute hypercapneic respiratory failure and required intubation. She was extubated the following day. She was weaned to home dose of 3 L [place].   She required milrinone, norepinephrine, and high dose IV lasix for diuresis. Norepinephrine and milrinone weaned with adequate BP and co-ox's. Her metoprolol was stopped. She was transitioned to torsemide 80 mg daily.   Nephrology was consulted for AKI with creatinine up to 4.3. Diuresis was held for several days, then restarted. Creatinine was monitored and improved back to baseline. Creatinine 2.83 on day of discharge. She will follow up with [facility] outpatient. They will call her for appointment. Their contact info is included on AVS.   Hemoglobin 7.7 on morning of discharge. She received 1 uPRBCs. She required additional diuresis with IV lasix after blood, so discharge was delayed. She also received IV iron and Aranesp injection during admission. She will remain on low dose ASA for mitraclip.   She remained in rate controlled afib with rates 60-70s off metoprolol. She had transient dysphagia that resolved with GI cocktail.   PT and OT consulted and initially  recommended HH, but then reconsulted and recommended SNF. Attempted to get approval for SNF, but initially was refused due to reported patient ability to walk 400 feet.   Unfortunately, pt decompensated with worsening SOB and deconditioning. Given IV lasix with improvement and able to be transitioned back to po torsemide. PT and OT re-consulted and pt unable to walk > 20 feet without stopping for rest. SW re-engaged and pt approved for SNF on 02/07/18.   She will be followed closely in HF clinic with appointment as below.   Discharge Weight Range: 167 lbs Discharge Vitals: Blood pressure 106/72, pulse 72, temperature 98 F (36.7 C), temperature source Oral, resp. rate (!) 22, height 5\' 4"  (1.626 m), weight 167 lb 5.3 oz (75.9 kg), SpO2 100 %.  Labs: Lab Results  Component Value Date   WBC 8.6 02/05/2018   HGB 7.7 (L) 02/05/2018   HCT 25.6 (L) 02/05/2018   MCV 97.3 02/05/2018   PLT 70 (L) 02/05/2018    Recent Labs  Lab 02/07/18 0314  NA 143  K 4.4  CL 99  CO2 34*  BUN 85*  CREATININE 2.79*  CALCIUM 10.1  GLUCOSE 194*   Lab Results  Component Value Date   CHOL 105 11/12/2017   HDL 53 11/12/2017   LDLCALC 43 11/12/2017   TRIG 45 11/12/2017   BNP (last 3 results) Recent Labs    01/27/18 0953  BNP 732.8*    ProBNP (last 3 results) No results for input(s): PROBNP in the last 8760 hours.   Diagnostic Studies/Procedures   Echo 6/27: EF 60-65%,  mild AS, mild MR, RA and LA atria severly dilated, severe TR, PA peak pressure 60 mmHg  Dg Chest Port 1 View  Result Date: 02/06/2018 CLINICAL DATA:  Shortness of breath EXAM: PORTABLE CHEST 1 VIEW COMPARISON:  02/02/2018 FINDINGS: Cardiac shadow is enlarged. Postsurgical changes are noted with mitral valve clips again identified. Left jugular central line is again noted and stable. Lungs are well aerated bilaterally. Some minimal changes are again seen in the right base likely of a more chronic nature. No new focal infiltrate is  seen. IMPRESSION: Stable appearance of the chest when compare with the prior exam. Electronically Signed   By: Inez Catalina M.D.   On: 02/06/2018 07:51    Discharge Medications   Allergies as of 02/07/2018   No Known Allergies     Medication List    STOP taking these medications   metolazone 2.5 MG tablet Commonly known as:  ZAROXOLYN   metoprolol succinate 25 MG 24 hr tablet Commonly known as:  TOPROL-XL     TAKE these medications   acetaminophen 500 MG tablet Commonly known as:  TYLENOL Take 1,000 mg by mouth every 6 (six) hours as needed for moderate pain or headache.   ALPRAZolam 0.25 MG tablet Commonly known as:  XANAX Take 1 tablet (0.25 mg total) by mouth every 12 (twelve) hours as needed for anxiety or sleep. What changed:  when to take this   aspirin 81 MG chewable tablet Chew 1 tablet (81 mg total) by mouth daily. Start taking on:  02/08/2018   atorvastatin 10 MG tablet Commonly known as:  LIPITOR Take 1 tablet (10 mg total) by mouth daily.   Biotin 5000 MCG Caps Take 5,000 mcg by mouth daily.   BLINK TEARS OP Place 1-2 drops into both eyes at bedtime.   feeding supplement (ENSURE ENLIVE) Liqd Take 237 mLs by mouth 3 (three) times daily between meals.   Fish Oil 1200 MG Caps Take 1,200 mg by mouth daily.   hydroxychloroquine 200 MG tablet Commonly known as:  PLAQUENIL Take 200 mg by mouth 2 (two) times daily.   multivitamin with minerals Tabs tablet Take 1 tablet by mouth daily. Start taking on:  02/08/2018   OXYGEN Inhale 3 L into the lungs continuous.   potassium chloride SA 20 MEQ tablet Commonly known as:  K-DUR,KLOR-CON Take 2 tablets (40 mEq total) by mouth daily. Start taking on:  02/08/2018 What changed:    medication strength  how much to take  when to take this  additional instructions  Another medication with the same name was removed. Continue taking this medication, and follow the directions you see here.   Tiotropium  Bromide-Olodaterol 2.5-2.5 MCG/ACT Aers Commonly known as:  STIOLTO RESPIMAT Inhale 2 puffs into the lungs daily.   torsemide 20 MG tablet Commonly known as:  DEMADEX Take 4 tablets (80 mg total) by mouth daily.   ZYLOPRIM 300 MG tablet Generic drug:  allopurinol Take 300 mg by mouth daily.            Durable Medical Equipment  (From admission, onward)        Start     Ordered   02/05/18 1412  Heart failure home health orders  (Heart failure home health orders / Face to face)  Once    Comments:  Heart Failure Follow-up Care:  Verify follow-up appointments per Patient Discharge Instructions. Confirm transportation arranged. Reconcile home medications with discharge medication list. Remove discontinued medications from use. Assist patient/caregiver to  manage medications using pill box. Reinforce low sodium food selection Assessments: Vital signs and oxygen saturation at each visit. Assess home environment for safety concerns, caregiver support and availability of low-sodium foods. Consult Education officer, museum, PT/OT, Dietitian, and CNA based on assessments. Perform comprehensive cardiopulmonary assessment. Notify MD for any change in condition or weight gain of 3 pounds in one day or 5 pounds in one week with symptoms. Daily Weights and Symptom Monitoring: Ensure patient has access to scales. Teach patient/caregiver to weigh daily before breakfast and after voiding using same scale and record.    Teach patient/caregiver to track weight and symptoms and when to notify Provider. Activity: Develop individualized activity plan with patient/caregiver.  Please draw CBC and BMET on 02/12/18 and fax to AHF clinic.  Question Answer Comment  Heart Failure Follow-up Care Advanced Heart Failure (AHF) Clinic at [phone]   Obtain the following labs Basic Metabolic Panel   Lab frequency Other see comments   Fax lab results to AHF Clinic at [phone]   Diet Low Sodium Heart Healthy     Fluid restrictions: 2000 mL Fluid   Consult: Social work   Initiate Heart Failure Clinic Diuretic Protocol to be used by [place] only ( to be ordered by Heart Failure Team Providers Only) Yes      02/05/18 1411   02/05/18 1141  For home use only DME Walker rolling  Once    Question:  Patient needs a walker to treat with the following condition  Answer:  Unsteadiness   02/05/18 1140      Disposition   The patient will be discharged in stable condition to SNF.  Discharge Instructions    (HEART FAILURE PATIENTS) Call MD:  Anytime you have any of the following symptoms: 1) 3 pound weight gain in 24 hours or 5 pounds in 1 week 2) shortness of breath, with or without a dry hacking cough 3) swelling in the hands, feet or stomach 4) if you have to sleep on extra pillows at night in order to breathe.   Complete by:  As directed    Amb Referral to Cardiac Rehabilitation   Complete by:  As directed    Diagnosis:  Valve Repair   Valve:  Mitral Comment - mitra clip   Diet - low sodium heart healthy   Complete by:  As directed    Increase activity slowly   Complete by:  As directed       Contact information for follow-up providers    Larey Dresser, MD Follow up on 02/18/2018.   Specialty:  Cardiology Why:  9:40 am. Garage code [address] information: [place] [address] [phone]        [facility].   Why:  [facility] will call you with follow up appointment. If you do not hear from them in a few days, please call to schedule.  Contact information: [place] [address] [place] Follow up.   Why:  rolling walker Contact information: [address] [place] [address] [phone]        [facility] Follow [facility].   Specialty:  [place] Why:  Heart Failure Orders, Registered Nurse, Physical therapy and social  worker Contact information: [address] [place] [address] [phone]            Contact information for after-discharge  care    [facility] .   Service:  Skilled Nursing Contact information: [address] [place] [phone]                    Duration of Discharge Encounter: Greater than 35 minutes   Signed, Annamaria Helling 02/07/2018, 12:25 PM   Agree with above.   She is stable from a HF perspective after MitraClip but continues to grow increasingly weak and dyspneic on only minimal exertion. CXR has been clear. Weight stable below baseline. Renal function stable.   PT/OT have seen again and now clearly qualifies for SNF. She will be discharged to [facility] facility today. Appreciate SW's assistance with this case.  Glori Bickers, MD  11:48 AM

## 2018-02-04 NOTE — Progress Notes (Signed)
Patient called RN and reported that after discussion with her son, she would like to go to a SNF instead of home with home health as previously decided. Patient is worried that it might be too late and that her previous decision might be irrevocable. RN reassured patient that the LCSW and the physician will be made aware of her new decision.

## 2018-02-04 NOTE — Progress Notes (Signed)
Brad w [facility] notified of [facility] preference. Awaiting orders.

## 2018-02-04 NOTE — Progress Notes (Addendum)
Advanced Heart Failure Rounding Note  PCP-Cardiologist: No primary care provider on file.   Subjective:     Co-ox 72% this am. Off Norepi and milrinone. SBP 100-110s. CVP 13-14  Received feraheme yesterday. Seen by PT who have recommended HHPT.   Feels weak and down today. She wants to go to SNF instead of home with April Mueller. No SOB or orthopnea. Able to ambulate hall.    Lasix stopped 6/29. Torsemide resumed yesterday. Sluggish UOP with only -250 mls, but weight down 2 lbs. Creatinine 4.3 -> 4.1 -> 4.2 -> 4.1-> 3.8 -> 3.2 -> 3.0.  Objective:   Weight Range: 169 lb 14.4 oz (77.1 kg) Body mass index is 29.16 kg/m.   Vital Signs:   Temp:  [97.4 F (36.3 C)-98.3 F (36.8 C)] 97.6 F (36.4 C) (07/02 0709) Pulse Rate:  [67-90] 73 (07/02 0709) Resp:  [22-32] 23 (07/02 0709) BP: (90-117)/(59-74) 113/63 (07/02 0709) SpO2:  [93 %-100 %] 97 % (07/02 0709) Weight:  [169 lb 14.4 oz (77.1 kg)] 169 lb 14.4 oz (77.1 kg) (07/02 0500) Last BM Date: 02/08/18  Weight change: Filed Weights   02/02/18 0500 02/03/18 0630 02/04/18 0500  Weight: 169 lb 15.6 oz (77.1 kg) 171 lb 1.2 oz (77.6 kg) 169 lb 14.4 oz (77.1 kg)    Intake/Output:   Intake/Output Summary (Last 24 hours) at 02/04/2018 0939 Last data filed at 02/04/2018 0927 Gross per 24 hour  Intake 437 ml  Output 1800 ml  Net -1363 ml      Physical Exam    CVP 13-14 General: Elderly. Weak. No resp difficulty. HEENT: Normal anicteric Neck: Supple. JVP 5-6. Carotids 2+ bilat; no bruits. No thyromegaly or nodule noted. Cor: PMI nondisplaced. IRR, 2/6 TR. Prominent RV lift. Left IJ TLC.  Lungs: CTAB, normal effort. Decreased breath sounds No wheeze Abdomen: Soft, non-tender, non-distended, no HSM. No bruits or masses. +BS  Extremities: no cyanosis, clubbing, rash, edema Neuro: alert & oriented x 3, cranial nerves grossly intact. moves all 4 extremities w/o difficulty. Affect pleasant   Telemetry   Aflutter 70s. Personally  reviewed.   EKG    No new tracings.    Labs    CBC Recent Labs    02/02/18 2356  WBC 7.9  HGB 7.8*  HCT 25.8*  MCV 97.4  PLT 52*   Basic Metabolic Panel Recent Labs    02/03/18 0524 02/04/18 0450  NA 141 139  K 3.4* 3.3*  CL 102 103  CO2 30 30  GLUCOSE 92 125*  BUN 90* 92*  CREATININE 3.28* 3.02*  CALCIUM 9.3 9.1   Liver Function Tests No results for input(s): AST, ALT, ALKPHOS, BILITOT, PROT, ALBUMIN in the last 72 hours. No results for input(s): LIPASE, AMYLASE in the last 72 hours. Cardiac Enzymes No results for input(s): CKTOTAL, CKMB, CKMBINDEX, TROPONINI in the last 72 hours.  BNP: BNP (last 3 results) Recent Labs    01/27/18 0953  BNP 732.8*    ProBNP (last 3 results) No results for input(s): PROBNP in the last 8760 hours.   D-Dimer No results for input(s): DDIMER in the last 72 hours. Hemoglobin A1C No results for input(s): HGBA1C in the last 72 hours. Fasting Lipid Panel No results for input(s): CHOL, HDL, LDLCALC, TRIG, CHOLHDL, LDLDIRECT in the last 72 hours. Thyroid Function Tests No results for input(s): TSH, T4TOTAL, T3FREE, THYROIDAB in the last 72 hours.  Invalid input(s): FREET3  Other results:   Imaging    No results  found.   Medications:     Scheduled Medications: . ALPRAZolam  0.25 mg Oral QHS  . aspirin  81 mg Oral Daily  . atorvastatin  10 mg Oral Daily  . chlorhexidine  15 mL Mouth Rinse BID  . [START ON 02/05/2018] Chlorhexidine Gluconate Cloth  6 each Topical Daily  . darbepoetin (ARANESP) injection - NON-DIALYSIS  40 mcg Subcutaneous Q Mon-1800  . feeding supplement (ENSURE ENLIVE)  237 mL Oral BID BM  . mouth rinse  15 mL Mouth Rinse q12n4p  . midodrine  5 mg Oral TID WC  . polyvinyl alcohol  1 drop Both Eyes QHS  . senna-docusate  1 tablet Oral QHS  . sodium chloride flush  10-40 mL Intracatheter Q12H  . torsemide  80 mg Oral Daily    Infusions: . sodium chloride Stopped (02/01/18 1536)  .  norepinephrine (LEVOPHED) Adult infusion 1 mcg/min (02/02/18 2000)    PRN Medications: acetaminophen, gi cocktail, levalbuterol, ondansetron (ZOFRAN) IV, sodium chloride flush    Patient Profile   April Mueller is a34 y.o.with COPD on home oxygen, rheumatoid arthritis, chronic atrial fibrillation, chronic diastolic CHF, Type A aortic dissection 2005,  GI bleed, anemia, CKD, OSA, and MR.    Admitted for mitral clip.  Assessment/Plan   1. Severe MR, S/P Mitral Clip Repair 7/26 - Complicated by respiratory failure  - Post-clip echo showed mild MR with mean gradient 5 mmHg. Severe TR with RV failure - No change.   2. Acute Hypoxic Respiratory Failure  - Urgent intubation 01/29/2018, extubated 6/27 - Now stable on 2 L [place].   3. Acute/Diastolic Heart Failure - Echo 6/27: EF 60-65%, mild AS, mild MR, RA and LA atria severly dilated, severe TR, PA peak pressure 60 mmHg - RHC 3/17 CVP was 13 with PCWP 17 (in setting of severe MR) - Volume status ok. CVP 13-14 - Continue torsemide 80 mg daily.  - Coox 72%. Off milrinone and NE. - Weight below perceived baseline currently.   4. Chronic A fib  - Rate controlled. Chronic. - She has not been on anticoagulants with history of GI Bleed. No change.   5. 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 Now s/p MitraClip - Based on RHC 10/2015. No change.   6.AKI on CKD stage IV  - Creatinine baseline ~3  - Creatinine down to 3.0 this am. Torsemide resumed yesterday.   7. H/O GI bleed  Extensive GI work up without evidence of bleeding.  - Hemoglobin 7.8 on 6/30. Denies bleeding.  - Iron stores low. Received IV iron 7/1.  - Continue SCDs. - Check CBC.  8. Dysphagia - Follow.  Can consider barium swallow as needed - Improved with GI cocktail. No change.   9. Dispo - PT/OT consulted. Recommending HHPT and OT with 24 hour supervision. CSW following. Pt now wants to go to SNF. RN will let CSW know.  - Feels down about how weak  she is. RN is getting PT to come back again today.   Possible discharge today or tomorrow. She has follow up with Dr Aundra Dubin on 7/16.   Length of Stay: [place], NP  02/04/2018, 9:39 AM  Advanced Heart Failure Team Pager [phone] (M-F; 7a - 4p)  Please contact [facility] for night-coverage after hours (4p -7a ) and weekends on [email or web address]  Patient seen and examined with the above-signed Advanced Practice Provider and/or Housestaff. I personally reviewed laboratory data, imaging studies and relevant notes. I independently examined  the patient and formulated the important aspects of the plan. I have edited the note to reflect any of my changes or salient points. I have personally discussed the plan with the patient and/or family.  Overall much improved. Volume status at renal function probably as good as we can get them and certainly better then they have been in a long time. PT has seen and recommend HHPT. She wants to stay in the [facility] because she likes all the care she has been getting throughout the day. Should be ready for d/c in am. D/w Dr. Burt Knack as well.   Glori Bickers, MD  7:11 PM

## 2018-02-05 LAB — BASIC METABOLIC PANEL
Anion gap: 8 (ref 5–15)
BUN: 88 mg/dL — AB (ref 8–23)
CHLORIDE: 102 mmol/L (ref 98–111)
CO2: 32 mmol/L (ref 22–32)
CREATININE: 2.83 mg/dL — AB (ref 0.44–1.00)
Calcium: 9.6 mg/dL (ref 8.9–10.3)
GFR calc non Af Amer: 16 mL/min — ABNORMAL LOW (ref >60)
GFR, EST AFRICAN AMERICAN: 18 mL/min — AB (ref >60)
Glucose, Bld: 124 mg/dL — ABNORMAL HIGH (ref 70–99)
Potassium: 3.7 mmol/L (ref 3.5–5.1)
SODIUM: 142 mmol/L (ref 135–145)

## 2018-02-05 LAB — COOXEMETRY PANEL
Carboxyhemoglobin: 2 % — ABNORMAL HIGH (ref 0.5–1.5)
Methemoglobin: 1.2 % (ref 0.0–1.5)
O2 SAT: 83.1 %
TOTAL HEMOGLOBIN: 9.6 g/dL — AB (ref 12.0–16.0)

## 2018-02-05 LAB — CBC
HCT: 25.6 % — ABNORMAL LOW (ref 36.0–46.0)
Hemoglobin: 7.7 g/dL — ABNORMAL LOW (ref 12.0–15.0)
MCH: 29.3 pg (ref 26.0–34.0)
MCHC: 30.1 g/dL (ref 30.0–36.0)
MCV: 97.3 fL (ref 78.0–100.0)
PLATELETS: 70 10*3/uL — AB (ref 150–400)
RBC: 2.63 MIL/uL — AB (ref 3.87–5.11)
RDW: 15.4 % (ref 11.5–15.5)
WBC: 8.6 10*3/uL (ref 4.0–10.5)

## 2018-02-05 LAB — PREPARE RBC (CROSSMATCH)

## 2018-02-05 MED ORDER — POTASSIUM CHLORIDE CRYS ER 20 MEQ PO TBCR
40.0000 meq | EXTENDED_RELEASE_TABLET | Freq: Every day | ORAL | Status: DC
  Administered 2018-02-05 – 2018-02-07 (×3): 40 meq via ORAL
  Filled 2018-02-05 (×3): qty 2

## 2018-02-05 MED ORDER — ADULT MULTIVITAMIN W/MINERALS CH
1.0000 | ORAL_TABLET | Freq: Every day | ORAL | Status: DC
  Administered 2018-02-05 – 2018-02-07 (×3): 1 via ORAL
  Filled 2018-02-05 (×3): qty 1

## 2018-02-05 MED ORDER — TORSEMIDE 20 MG PO TABS
80.0000 mg | ORAL_TABLET | Freq: Every day | ORAL | Status: DC
  Administered 2018-02-06 – 2018-02-07 (×2): 80 mg via ORAL
  Filled 2018-02-05 (×2): qty 4

## 2018-02-05 MED ORDER — FUROSEMIDE 10 MG/ML IJ SOLN
80.0000 mg | Freq: Once | INTRAMUSCULAR | Status: AC
  Administered 2018-02-05: 80 mg via INTRAVENOUS
  Filled 2018-02-05: qty 8

## 2018-02-05 MED ORDER — ENSURE ENLIVE PO LIQD
237.0000 mL | Freq: Three times a day (TID) | ORAL | Status: DC
  Administered 2018-02-05 – 2018-02-07 (×6): 237 mL via ORAL

## 2018-02-05 MED ORDER — SODIUM CHLORIDE 0.9% IV SOLUTION
Freq: Once | INTRAVENOUS | Status: AC
  Administered 2018-02-05: 11:00:00 via INTRAVENOUS

## 2018-02-05 NOTE — Progress Notes (Signed)
RN asked me to check on April Mueller. She is c/o of increased SOB.   Pt appears uncomfortable. She is pursed lip breathing. RR ~30. O2 sats 100 on 4L. Breath sounds mildly diminished in bases. She says she has been more SOB since yesterday.   She received IV lasix prior to unit of blood this morning and has urinated 1275 mls. CVP is 18 currently, up from 13 this morning.   Will discuss with Dr Haroldine Laws to determine whether she should still be discharged today.   Georgiana Shore, NP

## 2018-02-05 NOTE — Care Management (Addendum)
02/05/2018  1130: Update:  CM discussed case with Physician Advisor - PT note discussed in detail.  Physician Advisor in agreement with CSW AD -pt is not appropriate for LOG as insurance will not cover SNF stay.  Pt will need to discharge home with [facility] and family.  CM had long discussion with pt, pt remains in agreement to discharge home with [facility], PCS aide from [facility] and para medicine program.  Pt informed CM that she already knew her insurance will not cover SNF but she wants to remain with them because they will cover longterm placement should pt need it in the future.  Pt informed CM that her and her children will arrange for 24 hour a day coverage as best as possible, pt denied concerns with returning home at this time. Attending aware and in agreement for pt to discharge home today with [facility]. Pts son will arrive from [place] today.    08:19am  PT evaluated pt - SNF recommended for safety.  24 hour supervision also recommended at discharge - pt does not currently have.  CSW to discuss potential LOG for pt until insurance auth can be gained with CSW AD.  CM will continue to follow for discharge needs  7/2/219 Update:  Bedside nurse walked.   PT to reassess pt for safe discharge.  CSW will also meet with pt to determine if SNF is a possibility.  CM explained to pt that if SNF is not deemed necessary/deemed per CSW to not  and pt is unable to pay out of pocket; discharge plan would be for home with [facility].  Pt chose [facility] - agency will be contacted once orders are received.  Pt is interested in PT, RN for home health.  Pt is also interested in RW - orders also needed.  Pt educated on importance of daily weights and low sodium intake.  Para medicine team to follow pt at discharge.  Pt confirmed that family can bring portable tank to hospital for discharge home via private vehicle.  CM requested HH/DME orders  Pt is from home alone with PCS aide coming into the home 3 days a week for approximately 2 hours a day.  Pt is on 3-6  liters home oxygen supplied by [employer].  Pt changed her mind post speaking with family - pt interested in SNF as she doesn't fill safe at this point to discharge home. CSW made aware

## 2018-02-05 NOTE — Progress Notes (Signed)
Occupational Therapy Treatment Patient Details Name: April Mueller MRN: [id number] DOB: [birth year] Today's Date: 02/05/2018    History of present illness 72 y.o. admitted for mini MVR with acute respiratory failure VDRF 6/26-6/27. PMHx: COPD on home oxygen, rheumatoid arthritis, chronic Afib, chronic diastolic CHF, Type A aortic dissection 2005,  GI bleed, anemia, CKD, OSA, and MR   OT comments  Pt progressing towards established OT goals. Pt performing toileting and grooming at sink with Min Guard A for safety. Pt performing functional mobility with single hand held A. Pt reporting feeling tired and fatigued during activity. SpO2 staying in 90s on 4L O2. Continue to recommend dc home with HHOT and will continue to follow acutely as admitted.    Follow Up Recommendations  Home health OT;Supervision/Assistance - 24 hour    Equipment Recommendations  Tub/shower seat    Recommendations for Other Services      Precautions / Restrictions Precautions Precautions: Fall Precaution Comments: watch sats Restrictions Weight Bearing Restrictions: No       Mobility Bed Mobility               General bed mobility comments: At [facility] with NT upon arrival  Transfers Overall transfer level: Needs assistance Equipment used: Rolling walker (2 wheeled) Transfers: Sit to/from Stand Sit to Stand: Min guard         General transfer comment: MIn Guard A for safety    Balance Overall balance assessment: Needs assistance Sitting-balance support: Feet supported Sitting balance-Leahy Scale: Good     Standing balance support: Bilateral upper extremity supported Standing balance-Leahy Scale: Poor Standing balance comment: UE support for balance                           ADL either performed or assessed with clinical judgement   ADL Overall ADL's : Needs assistance/impaired     Grooming: Wash/dry hands;Min guard;Standing Grooming Details (indicate cue type and reason):  Pt performing hand hygiene after toileting. Min Guard A for Chiropractor: Min guard;Ambulation;BSC Toilet Transfer Details (indicate cue type and reason): Pt requiring MIn guard A for safety during toielt transfer Toileting- Clothing Manipulation and Hygiene: Min guard;Sit to/from stand Toileting - Clothing Manipulation Details (indicate cue type and reason): Min Guard A for safety during toilet hygiene.     Functional mobility during ADLs: Minimal assistance(single hand held A) General ADL Comments: Upon arrival, pt on BSC with NT. Requiring Min Guard A for toileting and hand hygiene at sink. Pt SpO2 staying in low 90s on 4L     Vision       Perception     Praxis      Cognition Arousal/Alertness: Awake/alert Behavior During Therapy: WFL for tasks assessed/performed Overall Cognitive Status: Within Functional Limits for tasks assessed                                 General Comments: Fatigued and tired        Exercises     Shoulder Instructions       General Comments Pt reporting fatigue and feeling tired    Pertinent Vitals/ Pain       Pain Assessment: Faces Faces Pain Scale: Hurts little more Pain Location: back with upright activity Pain Descriptors / Indicators: Aching;Sharp Pain Intervention(s): Monitored during session;Limited  activity within patient's tolerance;Repositioned  Home Living                                          Prior Functioning/Environment              Frequency  Min 2X/week        Progress Toward Goals  OT Goals(current goals can now be found in the care plan section)  Progress towards OT goals: Progressing toward goals  Acute Rehab OT Goals Patient Stated Goal: To take care of my house  OT Goal Formulation: With patient Time For Goal Achievement: 02/14/18 Potential to Achieve Goals: Good ADL Goals Pt Will Perform Grooming: with modified  independence;standing Pt Will Perform Upper Body Bathing: with modified independence;standing Pt Will Perform Lower Body Bathing: with modified independence;sit to/from stand Pt Will Perform Upper Body Dressing: with modified independence;sitting;standing Pt Will Perform Lower Body Dressing: with modified independence;sit to/from stand Pt Will Transfer to Toilet: with modified independence;ambulating;regular height toilet;bedside commode;grab bars Pt Will Perform Toileting - Clothing Manipulation and hygiene: with modified independence;sit to/from stand Pt Will Perform Tub/Shower Transfer: Tub transfer;Shower transfer;ambulating;shower seat;rolling walker;grab bars  Plan Discharge plan remains appropriate    Co-evaluation                 AM-PAC PT "6 Clicks" Daily Activity     Outcome Measure   Help from another person eating meals?: None Help from another person taking care of personal grooming?: A Little Help from another person toileting, which includes using toliet, bedpan, or urinal?: A Little Help from another person bathing (including washing, rinsing, drying)?: A Little Help from another person to put on and taking off regular upper body clothing?: A Little Help from another person to put on and taking off regular lower body clothing?: A Little 6 Click Score: 19    End of Session Equipment Utilized During Treatment: Oxygen  OT Visit Diagnosis: Unsteadiness on feet (R26.81)   Activity Tolerance Patient tolerated treatment well   Patient Left in chair;with call bell/phone within reach   Nurse Communication Mobility status        Time: 6599-3570 OT Time Calculation (min): 14 min  Charges: OT General Charges $OT Visit: 1 Visit OT Treatments $Self Care/Home Management : 8-22 mins  [place], OTR/L Acute Rehab Pager: [phone] Office: [place] 02/05/2018, 5:05 PM

## 2018-02-05 NOTE — Clinical Social Work Note (Addendum)
CSW discussed case with Surveyor, quantity of social work Catering manager. LOG denied. Patient would have to private pay for SNF until authorization obtained, no guarantee if it would be approved or not. Patient told RNCM and CSW yesterday that she could not afford to pay out of pocket. RNCM aware and will discuss with medical director of social work/case management depts.  Dayton Scrape, [place] [phone]  12:00 pm RNCM spoke with medical director over RNCM/CSW depts and he has also denied LOG. Patient agreeable to returning home at discharge. Per RN, patient is a potential discharge for today.  CSW signing off. Consult again if any other social work needs arise.  Dayton Scrape, [place]

## 2018-02-05 NOTE — Care Management Note (Addendum)
Case Management Note  Patient Details  Name: April Mueller MRN: [id number] Date of Birth: 02-08-1946  Subjective/Objective:    Pt admitted post MV clip                Action/Plan:  (Please see previous CM notes) PTA from home alone.  Pt will discharge home with HH/DME provided by [facility] per pts choice (agency accepted HH/DME referral including HF orders with certainty RN PT and SW. Pt is also being followed by [facility] program.   Pt will contact [place] directly to resume 2 hours of aide service 3 days a week.  Pt has home oxygen and is on baseline liter flow - [employer] supplies oxygen, pts family will bring portable tank for transport home via private vehicle.  Pt informed CM that she utilizes [facility] and [employer] for transportation in the community   Expected Discharge Date:                  Expected Discharge Plan:  [place]  In-House Referral:  Clinical Social Work  Discharge planning Services  CM Consult  Post Acute Care Choice:    Choice offered to:  Patient  DME Arranged:  [facility] D[facility] Agency:  [place] Arranged:  RN, PT, Social Work [employer] Agency:  [place]  Status of Service:  In process, will continue to follow  If discussed at Long Length of Stay Meetings, dates discussed:    Additional Comments:  Maryclare Labrador, RN 02/05/2018, 2:06 PM

## 2018-02-05 NOTE — Progress Notes (Addendum)
Advanced Heart Failure Rounding Note  PCP-Cardiologist: No primary care provider on file.   Subjective:     Co-ox 83% this am. Off Norepi and milrinone. SBP 100-110s. CVP 13  PT re-evaluated yesterday evening and is now recommending SNF. CM and CSW working on letter of guarantee because she has out of state insurance that takes 2 weeks for approval.    Back on torsemide 80 mg daily. Modest UOP with -1.2 L. Weight unchanged. Creatinine 4.3 -> 4.1 -> 4.2 -> 4.1-> 3.8 -> 3.2 -> 3.0 -> 2.83.  Received feraheme 7/1. Hemoglobin 7.7, platelets 70.   She is anxious about going home.  She feels more SOB today. No orthopnea or PND.  She has had dark stools, but thought it was r/t iron. No BRBPR. Denies CP.   Objective:   Weight Range: 169 lb 1.6 oz (76.7 kg) Body mass index is 29.03 kg/m.   Vital Signs:   Temp:  [97.3 F (36.3 C)-98.3 F (36.8 C)] 98.1 F (36.7 C) (07/03 0709) Pulse Rate:  [63-78] 75 (07/03 0709) Resp:  [24-29] 27 (07/03 0709) BP: (96-108)/(57-63) 99/57 (07/03 0709) SpO2:  [96 %-99 %] 99 % (07/03 0319) Weight:  [169 lb 1.6 oz (76.7 kg)] 169 lb 1.6 oz (76.7 kg) (07/03 0345) Last BM Date: 02/04/18  Weight change: Filed Weights   02/03/18 0630 02/04/18 0500 02/05/18 0345  Weight: 171 lb 1.2 oz (77.6 kg) 169 lb 14.4 oz (77.1 kg) 169 lb 1.6 oz (76.7 kg)    Intake/Output:   Intake/Output Summary (Last 24 hours) at 02/05/2018 0840 Last data filed at 02/05/2018 0759 Gross per 24 hour  Intake 727 ml  Output 1881 ml  Net -1154 ml      Physical Exam    CVP 13 General: Elderly. Weak. No resp difficulty. HEENT: Normal Anicteric Neck: Supple. JVP 13. Carotids 2+ bilat; no bruits. No thyromegaly or nodule noted. Cor: PMI nondisplaced. IRR, 2/6 TR. Prominent RV lift. Left IJ TLC.  Lungs: clear, diminished throughout. On 3 L [place]. No wheezeAbdomen: Soft, non-tender, non-distended, no HSM. No bruits or masses. +BS  Extremities: No cyanosis, clubbing, or rash. R and  LLE no edema. Ecchymosis to LUE.  Neuro: alert & oriented x 3, cranial nerves grossly intact. moves all 4 extremities w/o difficulty. Affect anxious   Telemetry   Aflutter 70s.  Personally reviewed.   EKG    No new tracings.    Labs    CBC Recent Labs    02/02/18 2356 02/05/18 0416  WBC 7.9 8.6  HGB 7.8* 7.7*  HCT 25.8* 25.6*  MCV 97.4 97.3  PLT 52* 70*   Basic Metabolic Panel Recent Labs    02/04/18 0450 02/05/18 0416  NA 139 142  K 3.3* 3.7  CL 103 102  CO2 30 32  GLUCOSE 125* 124*  BUN 92* 88*  CREATININE 3.02* 2.83*  CALCIUM 9.1 9.6   Liver Function Tests No results for input(s): AST, ALT, ALKPHOS, BILITOT, PROT, ALBUMIN in the last 72 hours. No results for input(s): LIPASE, AMYLASE in the last 72 hours. Cardiac Enzymes No results for input(s): CKTOTAL, CKMB, CKMBINDEX, TROPONINI in the last 72 hours.  BNP: BNP (last 3 results) Recent Labs    01/27/18 0953  BNP 732.8*    ProBNP (last 3 results) No results for input(s): PROBNP in the last 8760 hours.   D-Dimer No results for input(s): DDIMER in the last 72 hours. Hemoglobin A1C No results for input(s): HGBA1C in the  last 72 hours. Fasting Lipid Panel No results for input(s): CHOL, HDL, LDLCALC, TRIG, CHOLHDL, LDLDIRECT in the last 72 hours. Thyroid Function Tests No results for input(s): TSH, T4TOTAL, T3FREE, THYROIDAB in the last 72 hours.  Invalid input(s): FREET3  Other results:   Imaging    No results found.   Medications:     Scheduled Medications: . ALPRAZolam  0.25 mg Oral QHS  . aspirin  81 mg Oral Daily  . atorvastatin  10 mg Oral Daily  . chlorhexidine  15 mL Mouth Rinse BID  . Chlorhexidine Gluconate Cloth  6 each Topical Daily  . darbepoetin (ARANESP) injection - NON-DIALYSIS  40 mcg Subcutaneous Q Tue-1800  . feeding supplement (ENSURE ENLIVE)  237 mL Oral BID BM  . polyvinyl alcohol  1 drop Both Eyes QHS  . senna-docusate  1 tablet Oral QHS  . sodium chloride  flush  10-40 mL Intracatheter Q12H  . torsemide  80 mg Oral Daily    Infusions: . sodium chloride Stopped (02/01/18 1536)  . norepinephrine (LEVOPHED) Adult infusion 1 mcg/min (02/02/18 2000)    PRN Medications: acetaminophen, gi cocktail, levalbuterol, ondansetron (ZOFRAN) IV, sodium chloride flush    Patient Profile   April Mueller is a64 y.o.with COPD on home oxygen, rheumatoid arthritis, chronic atrial fibrillation, chronic diastolic CHF, Type A aortic dissection 2005,  GI bleed, anemia, CKD, OSA, and MR.    Admitted for mitral clip.  Assessment/Plan   1. Severe MR, S/P Mitral Clip Repair 3/30 - Complicated by respiratory failure  - Post-clip echo showed mild MR with mean gradient 5 mmHg. Severe TR with RV failure - No change.   2. Acute Hypoxic Respiratory Failure  - Urgent intubation 01/29/2018, extubated 6/27 - Now stable on 3 L [place] (home oxygen)  3. Acute/Diastolic Heart Failure - Echo 6/27: EF 60-65%, mild AS, mild MR, RA and LA atria severly dilated, severe TR, PA peak pressure 60 mmHg - RHC 3/17 CVP was 13 with PCWP 17 (in setting of severe MR) - Volume status ok. CVP 13 - Give 80 mg IV lasix x 1 today - Coox 83%. Off milrinone and NE. - Weight below perceived baseline currently.   4. Chronic A fib  - Rate controlled. Chronic. - She has not been on anticoagulants with history of GI Bleed. No change.   5. 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 Now s/p MitraClip - Based on RHC 10/2015. No change.   6.AKI on CKD stage IV  - Creatinine baseline ~3  - Creatinine 2.83 this am.   7. H/O GI bleed  Extensive GI work up without evidence of bleeding.  - Hemoglobin 7.8 on 6/30.  - Iron stores low. Received IV iron 7/1. Received aranesp here, but will not go home on this. Follows with hematology outpatient.  - Continue SCDs. - Hemoglobin 7.7 this am. She reports dark stools. No BRBPR. Transfuse 1 unit PRBCs  8. Dysphagia - Follow. Can consider  barium swallow as needed - Improved with GI cocktail. Resolved.   9. Dispo - PT/OT originally recommended HH with 24 hour supervision. Pt reevaluated by PT last night and they are now recommending SNF.  - CM and CSW working on letter of guarantee. She has out of [employer], which takes 2 weeks for approval.   She has follow up with Dr Aundra Dubin on 7/16.   Length of Stay: [place], NP  02/05/2018, 8:40 AM  Advanced Heart Failure Team Pager [phone] (M-F;  7a - 4p)  Please contact [facility] for night-coverage after hours (4p -7a ) and weekends on [email or web address]  Patient seen and examined with the above-signed Advanced Practice Provider and/or Housestaff. I personally reviewed laboratory data, imaging studies and relevant notes. I independently examined the patient and formulated the important aspects of the plan. I have edited the note to reflect any of my changes or salient points. I have personally discussed the plan with the patient and/or family.  Overall much improved after MV clip. Hgb down a bit and CVP up slightly. Will transfuse 1u RBCs and give IV lasix. Long talk about disposition. She is very nervous about going home. PT recommending SNF but insurance will not approve. I d/w CM personally and she was unable even to get a letter of guarantee to permit SNF placement. Thus will plan d/c home tomorrow with [facility].   April Bickers, MD  7:11 PM

## 2018-02-05 NOTE — Progress Notes (Signed)
Nutrition Follow-up  DOCUMENTATION CODES:   Not applicable  INTERVENTION:   -Increase Ensure Enlive po TID, each supplement provides 350 kcal and 20 grams of protein -MVI with minerals daily  NUTRITION DIAGNOSIS:   Inadequate oral intake related to decreased appetite as evidenced by per patient/family report.  Ongoing  GOAL:   Patient will meet greater than or equal to 90% of their needs  Progressing  MONITOR:   PO intake, Supplement acceptance  REASON FOR ASSESSMENT:   Consult Assessment of nutrition requirement/status, Diet education  ASSESSMENT:   Pt with PMH of COPD on home O2, RA, chronic afib, CHF, CKD, OSA, CKD stage IV, and severe MR admitted for severe MR now s/p clip repair 6/25.   Reviewed I/O's: -1.2 L x 24 hours and -2.5 L since admission  Case discussed with RN prior to visit, who confirms pt with poor appetite, but consuming supplements well. She shares that pt may be discharging soon.   Spoke with pt, who had flat affect at time of visit. She reports very poor oral intake related to breathing difficulty. Meal completion 0-50%. Pt with Ensure supplement on tray table, which she consumed 100%. Pt reports she is consuming two Ensure supplements per day. Discussed importance of good meal and supplement intake to promote healing.   Labs reviewed.   Diet Order:   Diet Order           Diet Heart Room service appropriate? Yes; Fluid consistency: Thin  Diet effective now          EDUCATION NEEDS:   Education needs have been addressed  Skin:  Skin Assessment: Reviewed RN Assessment  Last BM:  02/04/18  Height:   Ht Readings from Last 1 Encounters:  02/03/18 5\' 4"  (1.626 m)    Weight:   Wt Readings from Last 1 Encounters:  02/05/18 169 lb 1.6 oz (76.7 kg)    Ideal Body Weight:  54.5 kg  BMI:  Body mass index is 29.03 kg/m.  Estimated Nutritional Needs:   Kcal:  1600-1800  Protein:  90-100 grams  Fluid:  > 1.6 L/day    Tandra Rosado  A. Jimmye Norman, RD, LDN, CDE Pager: [phone] After hours Pager: [phone]

## 2018-02-06 ENCOUNTER — Inpatient Hospital Stay (HOSPITAL_COMMUNITY): Payer: Medicare Other

## 2018-02-06 LAB — BASIC METABOLIC PANEL
ANION GAP: 10 (ref 5–15)
BUN: 85 mg/dL — AB (ref 8–23)
CHLORIDE: 101 mmol/L (ref 98–111)
CO2: 33 mmol/L — AB (ref 22–32)
Calcium: 9.9 mg/dL (ref 8.9–10.3)
Creatinine, Ser: 2.67 mg/dL — ABNORMAL HIGH (ref 0.44–1.00)
GFR calc Af Amer: 20 mL/min — ABNORMAL LOW (ref >60)
GFR, EST NON AFRICAN AMERICAN: 17 mL/min — AB (ref >60)
GLUCOSE: 148 mg/dL — AB (ref 70–99)
POTASSIUM: 4 mmol/L (ref 3.5–5.1)
Sodium: 144 mmol/L (ref 135–145)

## 2018-02-06 LAB — TYPE AND SCREEN
ABO/RH(D): O POS
Antibody Screen: NEGATIVE
UNIT DIVISION: 0

## 2018-02-06 LAB — COOXEMETRY PANEL
Carboxyhemoglobin: 2.4 % — ABNORMAL HIGH (ref 0.5–1.5)
METHEMOGLOBIN: 1.3 % (ref 0.0–1.5)
O2 Saturation: 90.3 %
TOTAL HEMOGLOBIN: 9 g/dL — AB (ref 12.0–16.0)

## 2018-02-06 LAB — GLUCOSE, CAPILLARY: Glucose-Capillary: 144 mg/dL — ABNORMAL HIGH (ref 70–99)

## 2018-02-06 LAB — BPAM RBC
BLOOD PRODUCT EXPIRATION DATE: 201907282359
ISSUE DATE / TIME: 201907031030
UNIT TYPE AND RH: 5100

## 2018-02-06 NOTE — Progress Notes (Signed)
Occupational Therapy Treatment Patient Details Name: April Mueller MRN: [id number] DOB: September 08, 1945 Today's Date: 02/06/2018    History of present illness 72 y.o. admitted for mini MVR with acute respiratory failure VDRF 6/26-6/27. PMHx: COPD on home oxygen, rheumatoid arthritis, chronic Afib, chronic diastolic CHF, Type A aortic dissection 2005,  GI bleed, anemia, CKD, OSA, and MR   OT comments  Pt demonstrates a decrease in functional ability today. States "I don't feel good".  Pt able to ambulate @ 10 ft to [facility], complete toileting task then required extended time to recover due to 3/4 dyspnea; RR 33; SpO2 85 on 4L. Educated on pursed lip breathing technique and O2 slowly rebounded to 94.  Pt then ambulated @ 20 ft before requiring a standing rest break; ambulated another 107ft and required a seated rest break. DOE 3/4. Due to current level of functioning and difficulty with activity tolerance with ADL tasks and the resulting  increased fall risk, recommend short term rehab at SNF to facilitate safe DC home. Encourage ambulation @ rollator level with nursing. Will continue ot follow acutely to address established goals to facilitate safe DC to next venue of care.   Follow Up Recommendations  SNF;Supervision/Assistance - 24 hour    Equipment Recommendations  3 in 1 bedside commode;Tub/shower bench    Recommendations for Other Services      Precautions / Restrictions Precautions Precautions: Fall Precaution Comments: watch sats       Mobility Bed Mobility               General bed mobility comments: OOB in chair  Transfers Overall transfer level: Needs assistance Equipment used: 4-wheeled walker Transfers: Sit to/from Stand Sit to Stand: Supervision              Balance Overall balance assessment: Needs assistance   Sitting balance-Leahy Scale: Good       Standing balance-Leahy Scale: Poor Standing balance comment: reliant on external support                           ADL either performed or assessed with clinical judgement   ADL Overall ADL's : Needs assistance/impaired Eating/Feeding: Independent   Grooming: Supervision/safety;Standing   Upper Body Bathing: Set up;Sitting   Lower Body Bathing: Min guard;Sit to/from stand   Upper Body Dressing : Set up;Supervision/safety;Sitting   Lower Body Dressing: Minimal assistance;Sit to/from stand   Toilet Transfer: Min guard;Ambulation;RW;BSC   Toileting- Water quality scientist and Hygiene: Supervision/safety;Sit to/from stand       Functional mobility during ADLs: Min guard;Rolling walker;Cueing for safety General ADL Comments: able to ambulate to [facility] and complete toileting. After activity pt required extended period to rest prior to ambulating. DOE 3/4.      Vision       Perception     Praxis      Cognition Arousal/Alertness: Awake/alert Behavior During Therapy: WFL for tasks assessed/performed Overall Cognitive Status: Within Functional Limits for tasks assessed                                          Exercises     Shoulder Instructions       General Comments      Pertinent Vitals/ Pain       Pain Assessment: Faces Faces Pain Scale: Hurts little more Pain Location: general discomfort Pain Descriptors / Indicators:  Discomfort;Grimacing Pain Intervention(s): Limited activity within patient's tolerance  Home Living                                          Prior Functioning/Environment              Frequency  Min 2X/week        Progress Toward Goals  OT Goals(current goals can now be found in the care plan section)  Progress towards OT goals: Progressing toward goals  Acute Rehab OT Goals Patient Stated Goal: To get better and be on my own OT Goal Formulation: With patient Time For Goal Achievement: 02/14/18 Potential to Achieve Goals: Good ADL Goals Pt Will Perform Grooming: with modified  independence;standing Pt Will Perform Upper Body Bathing: with modified independence;standing Pt Will Perform Lower Body Bathing: with modified independence;sit to/from stand Pt Will Perform Upper Body Dressing: with modified independence;sitting;standing Pt Will Perform Lower Body Dressing: with modified independence;sit to/from stand Pt Will Transfer to Toilet: with modified independence;ambulating;regular height toilet;bedside commode;grab bars Pt Will Perform Toileting - Clothing Manipulation and hygiene: with modified independence;sit to/from stand Pt Will Perform Tub/Shower Transfer: Tub transfer;Shower transfer;ambulating;shower seat;rolling walker;grab bars  Plan Discharge plan needs to be updated    Co-evaluation                 AM-PAC PT "6 Clicks" Daily Activity     Outcome Measure   Help from another person eating meals?: None Help from another person taking care of personal grooming?: A Little Help from another person toileting, which includes using toliet, bedpan, or urinal?: A Little Help from another person bathing (including washing, rinsing, drying)?: A Little Help from another person to put on and taking off regular upper body clothing?: A Little Help from another person to put on and taking off regular lower body clothing?: A Little 6 Click Score: 19    End of Session Equipment Utilized During Treatment: Oxygen;Gait belt;Other (comment)(4L; rollator)  OT Visit Diagnosis: Unsteadiness on feet (R26.81);Muscle weakness (generalized) (M62.81);Other (comment)(poor endurance/activity tolerance)   Activity Tolerance Patient tolerated treatment well   Patient Left in chair;with call bell/phone within reach   Nurse Communication Mobility status;Other (comment)(need to ambulate)        Time: 1100-1040 OT Time Calculation (min): 1420 min  Charges: OT General Charges $OT Visit: 1 Visit OT Treatments $Self Care/Home Management : 38-52 mins  Maurie Boettcher,  OT/L  OT Clinical Specialist [phone]    [facility] 02/06/2018, 12:23 PM

## 2018-02-06 NOTE — Progress Notes (Signed)
Advanced Heart Failure Rounding Note  PCP-Cardiologist: No primary care provider on file.   Subjective:    Events: - S/p MitraClip 6/25 - Post-procedure had hypercapneic respiratory failure -> intubated - Received feraheme 7/1.  - 1u RBCs 7/3  Got 1u RBCs and 2 doses of IV lasix yesterday.   Last night was c/o SOB. CXR was clear. Still feels weak and SOB. CVP 13. Anxious about going home. Denies orthopnea or PND.  Objective:   Weight Range: 75.9 kg (167 lb 4.8 oz) Body mass index is 28.72 kg/m.   Vital Signs:   Temp:  [97.4 F (36.3 C)-98.7 F (37.1 C)] 98.4 F (36.9 C) (07/04 1025) Pulse Rate:  [70-86] 86 (07/04 1025) Resp:  [20-30] 22 (07/04 1025) BP: (105-118)/(65-81) 109/66 (07/04 1025) SpO2:  [96 %-100 %] 100 % (07/04 1025) Weight:  [75.9 kg (167 lb 4.8 oz)] 75.9 kg (167 lb 4.8 oz) (07/04 0413) Last BM Date: 02/06/18  Weight change: Filed Weights   02/04/18 0500 02/05/18 0345 02/06/18 0413  Weight: 77.1 kg (169 lb 14.4 oz) 76.7 kg (169 lb 1.6 oz) 75.9 kg (167 lb 4.8 oz)    Intake/Output:   Intake/Output Summary (Last 24 hours) at 02/06/2018 1101 Last data filed at 02/06/2018 1025 Gross per 24 hour  Intake 904 ml  Output 2452 ml  Net -1548 ml      Physical Exam    CVP 13-14 General:  Sitting in chair weak appearing  HEENT: normal Neck: supple. JVP to jaw Carotids 2+ bilat; no bruits. No lymphadenopathy or thryomegaly appreciated. Cor: PMI nondisplaced. Irregular rate & rhythm. 2/6 TR +RV lift  Lungs: clear Abdomen: soft, nontender, nondistended. No hepatosplenomegaly. No bruits or masses. Good bowel sounds. Extremities: no cyanosis, clubbing, rash, edema Neuro: alert & orientedx3, cranial nerves grossly intact. moves all 4 extremities w/o difficulty. Affect pleasant   Telemetry   Aflutter 70-80s.  Personally reviewed.   EKG    No new tracings.    Labs    CBC Recent Labs    02/05/18 0416  WBC 8.6  HGB 7.7*  HCT 25.6*  MCV 97.3    PLT 70*   Basic Metabolic Panel Recent Labs    02/05/18 0416 02/06/18 0500  NA 142 144  K 3.7 4.0  CL 102 101  CO2 32 33*  GLUCOSE 124* 148*  BUN 88* 85*  CREATININE 2.83* 2.67*  CALCIUM 9.6 9.9   Liver Function Tests No results for input(s): AST, ALT, ALKPHOS, BILITOT, PROT, ALBUMIN in the last 72 hours. No results for input(s): LIPASE, AMYLASE in the last 72 hours. Cardiac Enzymes No results for input(s): CKTOTAL, CKMB, CKMBINDEX, TROPONINI in the last 72 hours.  BNP: BNP (last 3 results) Recent Labs    01/27/18 0953  BNP 732.8*    ProBNP (last 3 results) No results for input(s): PROBNP in the last 8760 hours.   D-Dimer No results for input(s): DDIMER in the last 72 hours. Hemoglobin A1C No results for input(s): HGBA1C in the last 72 hours. Fasting Lipid Panel No results for input(s): CHOL, HDL, LDLCALC, TRIG, CHOLHDL, LDLDIRECT in the last 72 hours. Thyroid Function Tests No results for input(s): TSH, T4TOTAL, T3FREE, THYROIDAB in the last 72 hours.  Invalid input(s): FREET3  Other results:   Imaging    Dg Chest Port 1 View  Result Date: 02/06/2018 CLINICAL DATA:  Shortness of breath EXAM: PORTABLE CHEST 1 VIEW COMPARISON:  02/02/2018 FINDINGS: Cardiac shadow is enlarged. Postsurgical changes are noted  with mitral valve clips again identified. Left jugular central line is again noted and stable. Lungs are well aerated bilaterally. Some minimal changes are again seen in the right base likely of a more chronic nature. No new focal infiltrate is seen. IMPRESSION: Stable appearance of the chest when compare with the prior exam. Electronically Signed   By: Inez Catalina M.D.   On: 02/06/2018 07:51     Medications:     Scheduled Medications: . ALPRAZolam  0.25 mg Oral QHS  . aspirin  81 mg Oral Daily  . atorvastatin  10 mg Oral Daily  . chlorhexidine  15 mL Mouth Rinse BID  . Chlorhexidine Gluconate Cloth  6 each Topical Daily  . feeding supplement  (ENSURE ENLIVE)  237 mL Oral TID BM  . multivitamin with minerals  1 tablet Oral Daily  . polyvinyl alcohol  1 drop Both Eyes QHS  . potassium chloride  40 mEq Oral Daily  . senna-docusate  1 tablet Oral QHS  . sodium chloride flush  10-40 mL Intracatheter Q12H  . torsemide  80 mg Oral Daily    Infusions: . sodium chloride Stopped (02/01/18 1536)  . norepinephrine (LEVOPHED) Adult infusion 1 mcg/min (02/02/18 2000)    PRN Medications: acetaminophen, gi cocktail, levalbuterol, ondansetron (ZOFRAN) IV, sodium chloride flush    Patient Profile   April Mueller is a22 y.o.with COPD on home oxygen, rheumatoid arthritis, chronic atrial fibrillation, chronic diastolic CHF, Type A aortic dissection 2005,  GI bleed, anemia, CKD, OSA, and MR.    Admitted for mitral clip.  Assessment/Plan   1. Severe MR, S/P Mitral Clip Repair 0/62 - Complicated by respiratory failure  - Post-clip echo showed mild MR with mean gradient 5 mmHg. Severe TR with RV failure - No change.   2. Acute Hypoxic Respiratory Failure  - Urgent intubation 01/29/2018, extubated 6/27 - Now stable on 3 L [place] (home oxygen) - Remains SOB but CXR clear (Personally reviewed). CVP at baseline. Weight well below baseline  3. Acute/Diastolic Heart Failure - Echo 6/27: EF 60-65%, mild AS, mild MR, RA and LA atria severly dilated, severe TR, PA peak pressure 60 mmHg - RHC 3/17 CVP was 13 with PCWP 17 (in setting of severe MR) - Volume status ok. CVP 13 - Continue torsemide - Coox 90%. Off milrinone and NE. - Weight below baseline.   4. Chronic A fib  - Rate controlled. Chronic. - She has not been on anticoagulants with history of GI Bleed. No change.   5. 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 Now s/p MitraClip - Based on RHC 10/2015. No change.   6.AKI on CKD stage IV  - Creatinine baseline ~3  - Creatinine 2.67 this am.   7. H/O GI bleed  Extensive GI work up without evidence of bleeding.  -  Hemoglobin 7.7 on 7/3. Received 1u on 7/3. No overt bleeding - Iron stores low. Received IV iron 7/1. Received aranesp here, but will not go home on this. Follows with hematology outpatient.  - Continue SCDs.  8. Dysphagia - Resolved.   9. Dispo - PT/OT originally recommended HH with 24 hour supervision. Pt reevaluated and are now recommending SNF.  - However insurance company refused. Unable to get a letter of guarantee. - She is very anxious about going home - Will ask OT to evaluate today and reassess appropriateness for d/c home - If ok can d/c later today with very close f/u with HHPT and HHRN as we have  no other option for her  She has follow up with Dr Aundra Dubin on 7/16.   Length of Stay: [place], MD  02/06/2018, 11:01 AM  Advanced Heart Failure Team Pager [phone] (M-F; [place])  Please contact [facility] for night-coverage after hours (4p -7a ) and weekends on [email or web address]

## 2018-02-07 ENCOUNTER — Telehealth (HOSPITAL_COMMUNITY): Payer: Self-pay

## 2018-02-07 DIAGNOSIS — J441 Chronic obstructive pulmonary disease with (acute) exacerbation: Secondary | ICD-10-CM

## 2018-02-07 DIAGNOSIS — J9601 Acute respiratory failure with hypoxia: Secondary | ICD-10-CM

## 2018-02-07 LAB — BASIC METABOLIC PANEL
ANION GAP: 10 (ref 5–15)
BUN: 85 mg/dL — ABNORMAL HIGH (ref 8–23)
CO2: 34 mmol/L — AB (ref 22–32)
CREATININE: 2.79 mg/dL — AB (ref 0.44–1.00)
Calcium: 10.1 mg/dL (ref 8.9–10.3)
Chloride: 99 mmol/L (ref 98–111)
GFR calc Af Amer: 19 mL/min — ABNORMAL LOW (ref >60)
GFR calc non Af Amer: 16 mL/min — ABNORMAL LOW (ref >60)
GLUCOSE: 194 mg/dL — AB (ref 70–99)
POTASSIUM: 4.4 mmol/L (ref 3.5–5.1)
Sodium: 143 mmol/L (ref 135–145)

## 2018-02-07 MED ORDER — POTASSIUM CHLORIDE CRYS ER 20 MEQ PO TBCR: 40.0000 meq | EXTENDED_RELEASE_TABLET | Freq: Every day | ORAL | 6 refills | Status: DC

## 2018-02-07 MED ORDER — ASPIRIN 81 MG PO CHEW: 81.0000 mg | CHEWABLE_TABLET | Freq: Every day | ORAL | 6 refills | Status: AC

## 2018-02-07 MED ORDER — ADULT MULTIVITAMIN W/MINERALS CH: 1.0000 | ORAL_TABLET | Freq: Every day | ORAL | Status: AC

## 2018-02-07 MED ORDER — ENSURE ENLIVE PO LIQD: 237.0000 mL | Freq: Three times a day (TID) | ORAL | 12 refills | Status: DC

## 2018-02-07 NOTE — Clinical Social Work Note (Addendum)
CSW discussed case with Surveyor, quantity of social work Catering manager. Provided updates. LOG still denied. Patient will have to private pay for SNF or discharge home when stable.  Dayton Scrape, CSW [phone]  10:52 am CSW received call from MD. Reviewed updated bed offers with patient. She chose [facility]. CSW left message with admissions coordinator to start [place].  Dayton Scrape, CSW [phone]  11:45 am CSW received call from director of social work department. 5-day LOG approved after case discussion with MD. Only SNF that will take LOG is [facility]. Patient notified and agreeable.   Dayton Scrape, [place]

## 2018-02-07 NOTE — Clinical Social Work Placement (Signed)
   CLINICAL SOCIAL WORK PLACEMENT  NOTE  Date:  02/07/2018  Patient Details  Name: UNIQUE SILLAS MRN: [id number] Date of Birth: 1945/10/04  Clinical Social Work is seeking post-discharge placement for this patient at the [place] level of care (*CSW will initial, date and re-position this form in  chart as items are completed):  Yes   Patient/family provided with [facility] Work Department's list of facilities offering this level of care within the geographic area requested by the patient (or if unable, by the patient's family).  Yes   Patient/family informed of their freedom to choose among providers that offer the needed level of care, that participate in Medicare, Medicaid or managed care program needed by the patient, have an available bed and are willing to accept the patient.  Yes   Patient/family informed of [facility]'s ownership interest in [facility] and [facility], as well as of the fact that they are under no obligation to receive care at these facilities.  PASRR submitted to EDS on 02/03/18     PASRR number received on       Existing PASRR number confirmed on 02/03/18     FL2 transmitted to all facilities in geographic area requested by pt/family on 02/03/18     FL2 transmitted to all facilities within larger geographic area on       Patient informed that his/her managed care company has contracts with or will negotiate with certain facilities, including the following:        Yes   Patient/family informed of bed offers received.  Patient chooses bed at [facility]     Physician recommends and patient chooses bed at      Patient to be transferred to [facility] on 02/07/18.  Patient to be transferred to facility by [facility]     Patient family notified on 02/07/18 of transfer.  Name of family member notified:        PHYSICIAN Please prepare prescriptions     Additional Comment:     _______________________________________________ Candie Chroman, LCSW 02/07/2018, 12:36 PM

## 2018-02-07 NOTE — Progress Notes (Signed)
Physical Therapy Treatment Patient Details Name: April Mueller MRN: [id number] DOB: 1945/09/14 Today's Date: 02/07/2018    History of Present Illness 72 y.o. admitted for mini MVR with acute respiratory failure VDRF 6/26-6/27. PMHx: COPD on home oxygen, rheumatoid arthritis, chronic Afib, chronic diastolic CHF, Type A aortic dissection 2005,  GI bleed, anemia, CKD, OSA, and MR    PT Comments    Patient unable to progress with therapy this visit, upon entry patient struggling to stand up with nursing assistance due to fatigue and weakness. Pt now requiring min A at times to stand from normal height, unable to safely navigate short distances without hands on guarding due to deconditioning and weakness. When asked, pt reports she has felt a decline in her overall strength over last several days.  Pt ambulating with rollator 100' total today in hallway with min guard, requiring rest breaks 10-15', desatting to mid 80's on 4L. Cues for pursed lip breathing and able to return to 90's in 30-60 seconds. At this time, Pt is at increased risk of falling even with household distances as she had x2 instances of premature sitting due to fatigue this session with me. Pt lives alone without support in the area, I strongly believe ST-SNF stay would beneficial for patient to safely progress activity tolerance and functional mobility before returning home. Pt tearful at times and states she feels afraid to mobilize alone.      Follow Up Recommendations  SNF;Supervision/Assistance - 24 hour     Equipment Recommendations  3in1 (PT)    Recommendations for Other Services       Precautions / Restrictions Precautions Precautions: Fall Precaution Comments: watch sats Restrictions Weight Bearing Restrictions: No    Mobility  Bed Mobility               General bed mobility comments: sitting EOB with nursing upon entry  Transfers Overall transfer level: Needs assistance Equipment used: 4-wheeled  walker Transfers: Sit to/from Stand Sit to Stand: Min guard;Min assist Stand pivot transfers: Min guard       General transfer comment: Min A to assist in powering up from normal height bed. Pt reporting more fatigue and weakness today.   Ambulation/Gait Ambulation/Gait assistance: Min guard;Min assist Gait Distance (Feet): 100 Feet Assistive device: Rolling walker (2 wheeled) Gait Pattern/deviations: Step-to pattern;Step-through pattern;Decreased stride length;Trunk flexed;Shuffle Gait velocity: decrease   General Gait Details: Pt ambualting with rollator this visit, requires frequent rest breaks after 10-15'. RR rose to high 30s, SpO2 desats on 4L this session to mid 80s, with rest back to 90's. Patient unsafe to ambulate on her own at this time due to weakness and deconondtioning, requires hands on guard and support.    Stairs             Wheelchair Mobility    Modified Rankin (Stroke Patients Only)       Balance Overall balance assessment: Needs assistance   Sitting balance-Leahy Scale: Good       Standing balance-Leahy Scale: Poor Standing balance comment: reliant on external support                            Cognition Arousal/Alertness: Awake/alert Behavior During Therapy: WFL for tasks assessed/performed Overall Cognitive Status: Within Functional Limits for tasks assessed  General Comments: Fatigued and tired      Exercises      General Comments        Pertinent Vitals/Pain Pain Assessment: Faces Faces Pain Scale: Hurts little more Pain Location: general discomfort Pain Descriptors / Indicators: Discomfort;Grimacing Pain Intervention(s): Limited activity within patient's tolerance;Monitored during session;Premedicated before session    Home Living                      Prior Function            PT Goals (current goals can now be found in the care plan section) Acute  Rehab PT Goals Patient Stated Goal: To get better and be on my own PT Goal Formulation: With patient Time For Goal Achievement: 02/14/18 Potential to Achieve Goals: Fair Progress towards PT goals: Progressing toward goals    Frequency    Min 3X/week      PT Plan Current plan remains appropriate    Co-evaluation              AM-PAC PT "6 Clicks" Daily Activity  Outcome Measure  Difficulty turning over in bed (including adjusting bedclothes, sheets and blankets)?: A Little Difficulty moving from lying on back to sitting on the side of the bed? : Unable Difficulty sitting down on and standing up from a chair with arms (e.g., wheelchair, bedside commode, etc,.)?: A Little Help needed moving to and from a bed to chair (including a wheelchair)?: A Little Help needed walking in hospital room?: A Little Help needed climbing 3-5 steps with a railing? : A Lot 6 Click Score: 15    End of Session Equipment Utilized During Treatment: Gait belt;Oxygen Activity Tolerance: Patient limited by fatigue Patient left: with call bell/phone within reach;in chair Nurse Communication: Mobility status PT Visit Diagnosis: Other abnormalities of gait and mobility (R26.89);Muscle weakness (generalized) (M62.81)     Time: 1761-6073 PT Time Calculation (min) (ACUTE ONLY): 32 min  Charges:  $Gait Training: 8-22 mins $Therapeutic Activity: 8-22 mins                    G Codes:       Reinaldo Berber, PT, DPT Acute Rehab Services Pager: [phone]     Reinaldo Berber 02/07/2018, 10:18 AM

## 2018-02-07 NOTE — Telephone Encounter (Signed)
I called [name] to set up an initial appointment. She stated that she was currently in the back of an ambulance on her was to a rehabilitation facility and was unsure of when she would be able to get out. I will follow up with the clinic next week.

## 2018-02-07 NOTE — Progress Notes (Addendum)
Advanced Heart Failure Rounding Note  PCP-Cardiologist: No primary care provider on file.   Subjective:    Events: - S/p MitraClip 6/25 - Post-procedure had hypercapneic respiratory failure -> intubated - Received feraheme 7/1.  - 1u RBCs 7/3  Creatinine stable at 2.79. On torsemide. Weight unchanged, but below previous baseline.   PT/OT recommended SNF, but LOG still denied for SNF. Updated this am.   She continues to get weaker. Still SOB with minimal exertion. Cannot even ambulate room without stopping to rest. Denies CP or bleeding.  Objective:   Weight Range: 167 lb 5.3 oz (75.9 kg) Body mass index is 28.72 kg/m.   Vital Signs:   Temp:  [97.6 F (36.4 C)-99.3 F (37.4 C)] 98.2 F (36.8 C) (07/05 0731) Pulse Rate:  [71-86] 80 (07/05 0731) Resp:  [19-26] 23 (07/05 0731) BP: (101-109)/(50-68) 108/68 (07/05 0731) SpO2:  [97 %-100 %] 99 % (07/05 0731) Weight:  [167 lb 5.3 oz (75.9 kg)] 167 lb 5.3 oz (75.9 kg) (07/05 0300) Last BM Date: 02/06/18  Weight change: Filed Weights   02/05/18 0345 02/06/18 0413 02/07/18 0300  Weight: 169 lb 1.6 oz (76.7 kg) 167 lb 4.8 oz (75.9 kg) 167 lb 5.3 oz (75.9 kg)    Intake/Output:   Intake/Output Summary (Last 24 hours) at 02/07/2018 0917 Last data filed at 02/07/2018 0500 Gross per 24 hour  Intake 717 ml  Output 850 ml  Net -133 ml      Physical Exam    General: No resp difficulty. HEENT: Normal Neck: Supple. JVP to jaw. Carotids 2+ bilat; no bruits. No thyromegaly or nodule noted. Cor: PMI nondisplaced. IRR, 2/6 TR + RV lift Lungs: CTAB, normal effort. Abdomen: Soft, non-tender, non-distended, no HSM. No bruits or masses. +BS  Extremities: No cyanosis, clubbing, or rash. R and LLE no edema. BLE TED hose on.  Neuro: Alert & orientedx3, cranial nerves grossly intact. moves all 4 extremities w/o difficulty. Affect pleasant   Telemetry   Aflutter 70s. Personally reviewed.   EKG    No new tracings.    Labs    CBC Recent Labs    02/05/18 0416  WBC 8.6  HGB 7.7*  HCT 25.6*  MCV 97.3  PLT 70*   Basic Metabolic Panel Recent Labs    02/06/18 0500 02/07/18 0314  NA 144 143  K 4.0 4.4  CL 101 99  CO2 33* 34*  GLUCOSE 148* 194*  BUN 85* 85*  CREATININE 2.67* 2.79*  CALCIUM 9.9 10.1   Liver Function Tests No results for input(s): AST, ALT, ALKPHOS, BILITOT, PROT, ALBUMIN in the last 72 hours. No results for input(s): LIPASE, AMYLASE in the last 72 hours. Cardiac Enzymes No results for input(s): CKTOTAL, CKMB, CKMBINDEX, TROPONINI in the last 72 hours.  BNP: BNP (last 3 results) Recent Labs    01/27/18 0953  BNP 732.8*    ProBNP (last 3 results) No results for input(s): PROBNP in the last 8760 hours.   D-Dimer No results for input(s): DDIMER in the last 72 hours. Hemoglobin A1C No results for input(s): HGBA1C in the last 72 hours. Fasting Lipid Panel No results for input(s): CHOL, HDL, LDLCALC, TRIG, CHOLHDL, LDLDIRECT in the last 72 hours. Thyroid Function Tests No results for input(s): TSH, T4TOTAL, T3FREE, THYROIDAB in the last 72 hours.  Invalid input(s): FREET3  Other results:   Imaging    No results found.   Medications:     Scheduled Medications: . ALPRAZolam  0.25 mg Oral  QHS  . aspirin  81 mg Oral Daily  . atorvastatin  10 mg Oral Daily  . chlorhexidine  15 mL Mouth Rinse BID  . Chlorhexidine Gluconate Cloth  6 each Topical Daily  . feeding supplement (ENSURE ENLIVE)  237 mL Oral TID BM  . multivitamin with minerals  1 tablet Oral Daily  . polyvinyl alcohol  1 drop Both Eyes QHS  . potassium chloride  40 mEq Oral Daily  . senna-docusate  1 tablet Oral QHS  . sodium chloride flush  10-40 mL Intracatheter Q12H  . torsemide  80 mg Oral Daily    Infusions: . sodium chloride Stopped (02/01/18 1536)  . norepinephrine (LEVOPHED) Adult infusion 1 mcg/min (02/02/18 2000)    PRN Medications: acetaminophen, gi cocktail, levalbuterol, ondansetron  (ZOFRAN) IV, sodium chloride flush    Patient Profile   April Mueller is a32 y.o.with COPD on home oxygen, rheumatoid arthritis, chronic atrial fibrillation, chronic diastolic CHF, Type A aortic dissection 2005,  GI bleed, anemia, CKD, OSA, and MR.    Admitted for mitral clip.  Assessment/Plan   1. Severe MR, S/P Mitral Clip Repair 6/01 - Complicated by respiratory failure  - Post-clip echo showed mild MR with mean gradient 5 mmHg. Severe TR with RV failure - No change.   2. Acute Hypoxic Respiratory Failure  - Urgent intubation 01/29/2018, extubated 6/27 - Now stable on 3 L [place] (home oxygen) - Remains SOB but CXR clear yesterday (Personally reviewed). Weight well below baseline. No change.   3. Acute/Diastolic Heart Failure - Echo 6/27: EF 60-65%, mild AS, mild MR, RA and LA atria severly dilated, severe TR, PA peak pressure 60 mmHg - RHC 3/17 CVP was 13 with PCWP 17 (in setting of severe MR) - Volume status ok. Weight below baseline.  - Continue torsemide - Coox 90%. Off milrinone and NE. - Weight below baseline.   4. Chronic A fib  - Rate controlled. Chronic. - She has not been on anticoagulants with history of GI Bleed. No change.   5. 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 Now s/p MitraClip - Based on RHC 10/2015. No change.   6.AKI on CKD stage IV  - Creatinine baseline ~3  - Creatinine 2.79 this am.   7. H/O GI bleed  Extensive GI work up without evidence of bleeding.  - Hemoglobin 7.7 on 7/3. Received 1u on 7/3. Denies bleeding.  - Iron stores low. Received IV iron 7/1. Received aranesp here, but will not go home on this. Follows with hematology outpatient.  - Continue SCDs.  8. Dysphagia - Resolved. No change.   9. Dispo - PT/OT now recommending SNF, but LOG still denied. Updated 7/5. - However insurance company refused. Unable to get a letter of guarantee.  Attempted to contact CM regarding discharge plan.   She has follow up with Dr  Aundra Dubin on 7/16.   Length of Stay: [place], NP  02/07/2018, 9:17 AM  Advanced Heart Failure Team Pager [phone] (M-F; 7a - 4p)  Please contact [facility] for night-coverage after hours (4p -7a ) and weekends on [email or web address]  Patient seen and examined with the above-signed Advanced Practice Provider and/or Housestaff. I personally reviewed laboratory data, imaging studies and relevant notes. I independently examined the patient and formulated the important aspects of the plan. I have edited the note to reflect any of my changes or salient points. I have personally discussed the plan with the patient and/or family.  She is  stable from a HF perspective but continues to grow increasingly weak and dyspneic on only minimal exertion. CXR has been clear. Weight stable below baseline. Renal function stable.   PT/OT have seen again and now clearly qualifies for SNF. I have d/w SNF. There is just no way we can send her home safely without a SNF stay. Hopefully we can find placement today.   Glori Bickers, MD  11:48 AM

## 2018-02-07 NOTE — Progress Notes (Signed)
Patient was discharge to [facility] after IV  removal and report called. All belongings with patient including phone, clothes, walker and charger and home CPAP with patient. Will transfer care at this time.

## 2018-02-07 NOTE — Progress Notes (Signed)
Patient on home CPAP at this time, tolerating well, RCP will continue to follow.

## 2018-02-07 NOTE — Clinical Social Work Note (Signed)
CSW facilitated patient discharge including contacting patient family and facility to confirm patient discharge plans. Clinical information faxed to facility and family agreeable with plan. CSW arranged ambulance transport via [facility] to [facility]. RN to call report prior to discharge [phone] Room 101B).  CSW will sign off for now as social work intervention is no longer needed. Please consult [place] again if new needs arise.  Dayton Scrape, [place]

## 2018-02-12 ENCOUNTER — Telehealth (HOSPITAL_COMMUNITY): Payer: Self-pay | Admitting: Surgery

## 2018-02-12 ENCOUNTER — Telehealth (HOSPITAL_COMMUNITY): Payer: Self-pay

## 2018-02-12 ENCOUNTER — Encounter: Payer: Self-pay | Admitting: Emergency Medicine

## 2018-02-12 NOTE — Telephone Encounter (Signed)
Pt insurance is active and benefits verified through [place]. Co-pay $10.00, DED $100.00/$100.00 met, out of pocket $1,000.00/$657.25 met, co-insurance 0%. No pre-authorization required. [employer]port, 02/12/18 @ 10:46AM, DUP#[id number]

## 2018-02-12 NOTE — Telephone Encounter (Signed)
Called patient to see if she is interested in the Cardiac Rehab Program. Patient expressed interest but is currently in rehab. Will follow up with pt in 4 to 6 weeks. Placed referral in PT bin.   Explained scheduling process and went over insurance, patient verbalized understanding. [name]

## 2018-02-12 NOTE — Telephone Encounter (Signed)
April Mueller has been discharged to SNF and will have HF Paramedicine program visits placed on hold secondary to this.  HF Community Paramedicine visits can be initiated once patient is discharged from SNF.

## 2018-02-13 ENCOUNTER — Telehealth: Payer: Self-pay | Admitting: Emergency Medicine

## 2018-02-13 ENCOUNTER — Encounter: Payer: Self-pay | Admitting: Emergency Medicine

## 2018-02-13 NOTE — Telephone Encounter (Signed)
Spoke with Designer, fashion/clothing at [employer]. States that the pt is having decreased O2 sats despite being on oxygen. Pt has been scheduled with MW on 02/14/18. Charlane didn't think that the pt could come in for an appointment this afternoon. She will call back if the appointment for tomorrow doesn't work for the pt.

## 2018-02-14 ENCOUNTER — Ambulatory Visit (INDEPENDENT_AMBULATORY_CARE_PROVIDER_SITE_OTHER): Payer: Medicare Other | Admitting: Pulmonary Disease

## 2018-02-14 ENCOUNTER — Ambulatory Visit: Payer: Medicare Other | Admitting: Internal Medicine

## 2018-02-14 ENCOUNTER — Encounter: Payer: Self-pay | Admitting: Pulmonary Disease

## 2018-02-14 VITALS — BP 102/60 | HR 89 | Temp 97.9°F | Ht 64.0 in | Wt 168.0 lb

## 2018-02-14 DIAGNOSIS — J449 Chronic obstructive pulmonary disease, unspecified: Secondary | ICD-10-CM

## 2018-02-14 DIAGNOSIS — I340 Nonrheumatic mitral (valve) insufficiency: Secondary | ICD-10-CM

## 2018-02-14 DIAGNOSIS — Z9989 Dependence on other enabling machines and devices: Secondary | ICD-10-CM | POA: Diagnosis not present

## 2018-02-14 DIAGNOSIS — R0600 Dyspnea, unspecified: Secondary | ICD-10-CM

## 2018-02-14 DIAGNOSIS — G4733 Obstructive sleep apnea (adult) (pediatric): Secondary | ICD-10-CM | POA: Diagnosis not present

## 2018-02-14 NOTE — Progress Notes (Signed)
April Mueller    [id number]    1945-08-09  Primary Care Physician:Ross, Dwyane Luo, MD  Referring Physician: Lawerance Cruel, MD [place], [place] [address]  Chief complaint: Acute visit for dyspnea, abnormal chest x-ray  HPI: 71 year old with history of rheumatoid arthritis on Plaquenil, treated latent TB, hypertension, diastolic heart failure, COPD, OSA, mitral valve disease, pulmonary hypertension. She had recent hospitalization for severe MR status post mitral clip repair on 01/28/2018.  Postoperative course complicated by acute hypoxic respiratory failure secondary to heart failure, intubated from 6/26-6/27 Required milrinone, Norepi, high-dose IV Lasix for diuresis.  Discharged on torsemide 80 mg daily. Hospital course also noted for AKI with creatinine up to 4.3.  Diuresis was held for several days and restarted.  Creatinine 2.8 on discharge.  Discharged on 02/07/18  She is currently in a nursing home.  She had a chest x-ray on 7/11 with mild CHF, moderate cardiomegaly and had an brief episode of dyspnea, chest tightness yesterday and has been sent here for further evaluation.  Outpatient Encounter Medications as of 02/14/2018  Medication Sig  . acetaminophen (TYLENOL) 500 MG tablet Take 1,000 mg by mouth every 6 (six) hours as needed for moderate pain or headache.  . allopurinol (ZYLOPRIM) 300 MG tablet Take 300 mg by mouth daily.  [name] ALPRAZolam (XANAX) 0.25 MG tablet Take 1 tablet (0.25 mg total) by mouth every 12 (twelve) hours as needed for anxiety or sleep. (Patient taking differently: Take 0.25 mg by mouth at bedtime. )  . aspirin 81 MG chewable tablet Chew 1 tablet (81 mg total) by mouth daily.  [name] atorvastatin (LIPITOR) 10 MG tablet Take 1 tablet (10 mg total) by mouth daily.  . Biotin 5000 MCG CAPS Take 5,000 mcg by mouth daily.   [name] gabapentin (NEURONTIN) 100 MG capsule Take 100 mg by mouth daily.  . hydroxychloroquine (PLAQUENIL) 200 MG  tablet Take 200 mg by mouth 2 (two) times daily.  . Multiple Vitamin (MULTIVITAMIN WITH MINERALS) TABS tablet Take 1 tablet by mouth daily.  . Omega-3 Fatty Acids (FISH OIL) 1200 MG CAPS Take 1,200 mg by mouth daily.  . OXYGEN Inhale 3 L into the lungs continuous.   . Polyethylene Glycol 400 (BLINK TEARS OP) Place 1-2 drops into both eyes at bedtime.  . potassium chloride SA (K-DUR,KLOR-CON) 20 MEQ tablet Take 2 tablets (40 mEq total) by mouth daily.  [name] torsemide (DEMADEX) 20 MG tablet Take 4 tablets (80 mg total) by mouth daily.  . [DISCONTINUED] feeding supplement, ENSURE ENLIVE, (ENSURE ENLIVE) LIQD Take 237 mLs by mouth 3 (three) times daily between meals.  . [DISCONTINUED] Tiotropium Bromide-Olodaterol (STIOLTO RESPIMAT) 2.5-2.5 MCG/ACT AERS Inhale 2 puffs into the lungs daily. (Patient not taking: Reported on 01/21/2018)   No facility-administered encounter medications on file as of 02/14/2018.     Allergies as of 02/14/2018  . (No Known Allergies)    Past Medical History:  Diagnosis Date  . Anemia 03/23/15   transfusion  . Aortic aneurysm ([place])   . CHF (congestive heart failure) ([place])   . Chronic lower back pain   . Chronic thoracic aortic dissection (HCC)    S/P repair ascending thoracic aortic dissection with chronic residual dissection involving transverse aortic arch and descending thoracic aorta  . CKD (chronic kidney disease) stage 3, GFR 30-59 ml/min (HCC)    "on dialysis for 3 months after my aneurysm in 2005" (02/20/2016)  . COPD (chronic obstructive pulmonary  disease) ([place])   . Gout   . History of blood transfusion "several"  . Hypertension   . Lupus ([place])   . On home oxygen therapy    "3L; 24/7" (02/20/2016)  . OSA on CPAP   . Pulmonary HTN ([place])   . Rheumatoid arthritis(714.0) 11/06/2012  . S/P aortic dissection repair   . Tuberculosis    dormant carrier   Past Surgical History:  Procedure Laterality Date  . BREAST EXCISIONAL BIOPSY Right    benign  . BREAST  LUMPECTOMY Right   . CARDIAC CATHETERIZATION N/A 10/24/2015   Procedure: Right Heart Cath;  Surgeon: Larey Dresser, MD;  Location: [facility];  Service: Cardiovascular;  Laterality: N/A;  . CATARACT EXTRACTION W/ INTRAOCULAR LENS  IMPLANT, BILATERAL Bilateral 2016  . COLONOSCOPY N/A 08/19/2015   Procedure: COLONOSCOPY;  Surgeon: Jerene Bears, MD;  Location: [facility] ENDOSCOPY;  Service: Endoscopy;  Laterality: N/A;  . ENTEROSCOPY N/A 02/22/2016   Procedure: ENTEROSCOPY;  Surgeon: Milus Banister, MD;  Location: [place];  Service: Endoscopy;  Laterality: N/A;  . ESOPHAGOGASTRODUODENOSCOPY N/A 08/19/2015   Procedure: ESOPHAGOGASTRODUODENOSCOPY (EGD);  Surgeon: Jerene Bears, MD;  Location: [facility] ENDOSCOPY;  Service: Endoscopy;  Laterality: N/A;  patient scheduled, anesthesia aware of 1500 case, per [place].  08/18/15 DP  . GIVENS CAPSULE STUDY N/A 08/21/2015   Procedure: GIVENS CAPSULE STUDY;  Surgeon: Gatha Mayer, MD;  Location: [place];  Service: Endoscopy;  Laterality: N/A;  . HEMORRHOID SURGERY    . MITRAL VALVE REPAIR N/A 01/29/2018   Procedure: MITRAL VALVE REPAIR;  Surgeon: Sherren Mocha, MD;  Location: [place] [facility];  Service: Cardiovascular;  Laterality: N/A;  . REPAIR OF ACUTE ASCENDING THORACIC AORTIC DISSECTION  2005  . RIGHT HEART CATHETERIZATION N/A 08/18/2014   Procedure: RIGHT HEART CATH;  Surgeon: Jolaine Artist, MD;  Location[facility] CATH LAB;  Service: Cardiovascular;  Laterality: N/A;  . TEE WITHOUT CARDIOVERSION N/A 01/05/2015   Procedure: TRANSESOPHAGEAL ECHOCARDIOGRAM (TEE);  Surgeon: Larey Dresser, MD;  Location: [place];  Service: Cardiovascular;  Laterality: N/A;  . TEE WITHOUT CARDIOVERSION N/A 11/20/2017   Procedure: TRANSESOPHAGEAL ECHOCARDIOGRAM (TEE);  Surgeon: Larey Dresser, MD;  Location: [facility] ENDOSCOPY;  Service: Cardiovascular;  Laterality: N/A;  . WISDOM TOOTH EXTRACTION      Family History  Problem Relation Age of Onset  . Heart attack  Mother   . Prostate cancer Father   . Diabetes Brother   . Kidney failure Brother   . Breast cancer Maternal Aunt   . Colon cancer Neg Hx   . Stomach cancer Neg Hx     Social History   Socioeconomic History  . Marital status: Legally Separated    Spouse name: Not on file  . Number of children: 1  . Years of education: Not on file  . Highest education level: Not on file  Occupational History  . Occupation: Disability    Comment: Office Work  Scientific laboratory technician  . Financial resource strain: Not on file  . Food insecurity:    Worry: Not on file    Inability: Not on file  . Transportation needs:    Medical: Not on file    Non-medical: Not on file  Tobacco Use  . Smoking status: Former Smoker    Packs/day: 1.50    Years: 14.00    Pack years: 21.00    Types: Cigarettes    Last attempt to quit: 08/09/2003    Years since quitting:  14.5  . Smokeless tobacco: Never Used  Substance and Sexual Activity  . Alcohol use: Yes    Alcohol/week: 0.0 oz    Comment: 02/20/2016 "drank a little in my teens"  . Drug use: No  . Sexual activity: Never  Lifestyle  . Physical activity:    Days per week: Not on file    Minutes per session: Not on file  . Stress: Not on file  Relationships  . Social connections:    Talks on phone: Not on file    Gets together: Not on file    Attends religious service: Not on file    Active member of club or organization: Not on file    Attends meetings of clubs or organizations: Not on file    Relationship status: Not on file  . Intimate partner violence:    Fear of current or ex partner: Not on file    Emotionally abused: Not on file    Physically abused: Not on file    Forced sexual activity: Not on file  Other Topics Concern  . Not on file  Social History Narrative  . Not on file   Review of systems: Review of Systems  Constitutional: Negative for fever and chills.  HENT: Negative.   Eyes: Negative for blurred vision.  Respiratory: as per HPI    Cardiovascular: Negative for chest pain and palpitations.  Gastrointestinal: Negative for vomiting, diarrhea, blood per rectum. Genitourinary: Negative for dysuria, urgency, frequency and hematuria.  Musculoskeletal: Negative for myalgias, back pain and joint pain.  Skin: Negative for itching and rash.  Neurological: Negative for dizziness, tremors, focal weakness, seizures and loss of consciousness.  Endo/Heme/Allergies: Negative for environmental allergies.  Psychiatric/Behavioral: Negative for depression, suicidal ideas and hallucinations.  All other systems reviewed and are negative.  Physical Exam: Blood pressure 102/60, pulse 89, temperature 97.9 F (36.6 C), temperature source Oral, height 5\' 4"  (1.626 m), weight 168 lb (76.2 kg), SpO2 92 %. Gen:      No acute distress HEENT:  EOMI, sclera anicteric Neck:     No masses; no thyromegaly Lungs:    Clear to auscultation bilaterally; normal respiratory effort CV:         Regular rate and rhythm; no murmurs Abd:      + bowel sounds; soft, non-tender; no palpable masses, no distension Ext:    No edema; adequate peripheral perfusion Skin:      Warm and dry; no rash Neuro: alert and oriented x 3 Psych: normal mood and affect  Data Reviewed: Chest x-ray from home dated 02/13/2018 Mild CHF, moderate cardiomegaly.  I do not have the images to review  Labs 02/12/2018 from nursing home BUN/creatinine 86/3.33 Sodium 144, potassium 4.1, hepatic function panel within normal limits. CBC 8.5, hemoglobin 8.6, platelets 78  PFTs 12/04/2017 FVC 0.92 [38%], FEV1 0.68 [37%], F/F 74, TLC 48%, DLCO 23% Severe obstructive airway disease, severe restriction, severe diffusion defect.  Assessment:  Follow-up for dyspnea, CHF She has a recent admission for mitral valve clipping, ICU stay for CHF requiring pressors, aggressive diuresis Continues on torsemide 80 mg a day. Labs from nursing home reviewed with creatinine slightly higher at 3.3 CXR with  mild edema  We will increase her torsemide dose slightly to 60 mg in the morning and 40 mg in the evening She has follow-up at heart failure clinic next week with Dr. Aundra Dubin when a repeat metabolic panel can be checked to monitor kidney function She is awaiting outpatient follow-up with  nephrology.  COPD No evidence of acute exacerbation Continue Spiriva  Plan/Recommendations: - Increase torsemide to 60 mg a morning, 40 mg in evening - Monitor closely. Repeat BMP next week. - Continue spiriva.  - Follow up in heart failure clinic next week.  Marshell Garfinkel MD [facility] 02/14/2018, 10:55 AM  CC: Lawerance Cruel, MD

## 2018-02-14 NOTE — Patient Instructions (Addendum)
We will increase her torsemide dose to 60 mg in the morning and 40 mg in the evening You will need labs.  If not done at the nursing home then will check CBC and be met Please make sure you have follow-up with your heart failure clinic next week to further manage your heart failure

## 2018-02-18 ENCOUNTER — Ambulatory Visit (HOSPITAL_COMMUNITY)
Admission: RE | Admit: 2018-02-18 | Discharge: 2018-02-18 | Disposition: A | Discharge status: Home / Self Care | Payer: Medicare Other | Source: Ambulatory Visit | Attending: Cardiology | Admitting: Cardiology

## 2018-02-18 VITALS — BP 102/56 | HR 72 | Wt 171.8 lb

## 2018-02-18 DIAGNOSIS — Z79899 Other long term (current) drug therapy: Secondary | ICD-10-CM | POA: Insufficient documentation

## 2018-02-18 DIAGNOSIS — I481 Persistent atrial fibrillation: Secondary | ICD-10-CM | POA: Diagnosis not present

## 2018-02-18 DIAGNOSIS — I272 Pulmonary hypertension, unspecified: Secondary | ICD-10-CM | POA: Diagnosis not present

## 2018-02-18 DIAGNOSIS — Z833 Family history of diabetes mellitus: Secondary | ICD-10-CM | POA: Insufficient documentation

## 2018-02-18 DIAGNOSIS — Z87891 Personal history of nicotine dependence: Secondary | ICD-10-CM | POA: Diagnosis not present

## 2018-02-18 DIAGNOSIS — Z09 Encounter for follow-up examination after completed treatment for conditions other than malignant neoplasm: Secondary | ICD-10-CM | POA: Insufficient documentation

## 2018-02-18 DIAGNOSIS — Z8249 Family history of ischemic heart disease and other diseases of the circulatory system: Secondary | ICD-10-CM | POA: Diagnosis not present

## 2018-02-18 DIAGNOSIS — Z7982 Long term (current) use of aspirin: Secondary | ICD-10-CM | POA: Insufficient documentation

## 2018-02-18 DIAGNOSIS — D649 Anemia, unspecified: Secondary | ICD-10-CM | POA: Insufficient documentation

## 2018-02-18 DIAGNOSIS — I482 Chronic atrial fibrillation, unspecified: Secondary | ICD-10-CM

## 2018-02-18 DIAGNOSIS — I5032 Chronic diastolic (congestive) heart failure: Secondary | ICD-10-CM | POA: Insufficient documentation

## 2018-02-18 DIAGNOSIS — Z9981 Dependence on supplemental oxygen: Secondary | ICD-10-CM | POA: Insufficient documentation

## 2018-02-18 DIAGNOSIS — Z8042 Family history of malignant neoplasm of prostate: Secondary | ICD-10-CM | POA: Insufficient documentation

## 2018-02-18 DIAGNOSIS — Z841 Family history of disorders of kidney and ureter: Secondary | ICD-10-CM | POA: Insufficient documentation

## 2018-02-18 DIAGNOSIS — N184 Chronic kidney disease, stage 4 (severe): Secondary | ICD-10-CM | POA: Insufficient documentation

## 2018-02-18 DIAGNOSIS — I130 Hypertensive heart and chronic kidney disease with heart failure and stage 1 through stage 4 chronic kidney disease, or unspecified chronic kidney disease: Secondary | ICD-10-CM | POA: Insufficient documentation

## 2018-02-18 DIAGNOSIS — Z9889 Other specified postprocedural states: Secondary | ICD-10-CM | POA: Diagnosis not present

## 2018-02-18 DIAGNOSIS — J449 Chronic obstructive pulmonary disease, unspecified: Secondary | ICD-10-CM | POA: Diagnosis not present

## 2018-02-18 DIAGNOSIS — J9610 Chronic respiratory failure, unspecified whether with hypoxia or hypercapnia: Secondary | ICD-10-CM | POA: Diagnosis not present

## 2018-02-18 DIAGNOSIS — G4733 Obstructive sleep apnea (adult) (pediatric): Secondary | ICD-10-CM | POA: Insufficient documentation

## 2018-02-18 DIAGNOSIS — I340 Nonrheumatic mitral (valve) insufficiency: Secondary | ICD-10-CM | POA: Diagnosis not present

## 2018-02-18 DIAGNOSIS — Z803 Family history of malignant neoplasm of breast: Secondary | ICD-10-CM | POA: Diagnosis not present

## 2018-02-18 LAB — BASIC METABOLIC PANEL
Anion gap: 10 (ref 5–15)
BUN: 67 mg/dL — ABNORMAL HIGH (ref 8–23)
CHLORIDE: 104 mmol/L (ref 98–111)
CO2: 29 mmol/L (ref 22–32)
Calcium: 10.3 mg/dL (ref 8.9–10.3)
Creatinine, Ser: 3.43 mg/dL — ABNORMAL HIGH (ref 0.44–1.00)
GFR calc Af Amer: 15 mL/min — ABNORMAL LOW (ref >60)
GFR, EST NON AFRICAN AMERICAN: 13 mL/min — AB (ref >60)
Glucose, Bld: 95 mg/dL (ref 70–99)
POTASSIUM: 4.8 mmol/L (ref 3.5–5.1)
SODIUM: 143 mmol/L (ref 135–145)

## 2018-02-18 LAB — CBC
HEMATOCRIT: 30.1 % — AB (ref 36.0–46.0)
Hemoglobin: 9 g/dL — ABNORMAL LOW (ref 12.0–15.0)
MCH: 30.5 pg (ref 26.0–34.0)
MCHC: 29.9 g/dL — ABNORMAL LOW (ref 30.0–36.0)
MCV: 102 fL — AB (ref 78.0–100.0)
PLATELETS: 77 10*3/uL — AB (ref 150–400)
RBC: 2.95 MIL/uL — AB (ref 3.87–5.11)
RDW: 18.6 % — AB (ref 11.5–15.5)
WBC: 6.1 10*3/uL (ref 4.0–10.5)

## 2018-02-18 MED ORDER — TORSEMIDE 20 MG PO TABS: ORAL_TABLET | ORAL | 3 refills | Status: DC

## 2018-02-18 NOTE — Progress Notes (Signed)
Patient ID: April Mueller, female   DOB: Jul 29, 1946, 72 y.o.   MRN: [id number]   HPI  PCP: Dr Harrington Challenger Pulmonary: Dr Byrum/Dr Melvyn Novas  Cardiology: Dr. Aundra Dubin  April Mueller is a 72 y.o. with COPD on home oxygen, rheumatoid arthritis, chronic atrial fibrillation, chronic diastolic CHF, CKD.    Moved back from [place] and saw Dr. Lamonte Sakai. In [place] was followed by cardiology and pulmonary. Started on Tracleer in 2006. Revatio was added, however was discontinued without any change in her symptoms. Stopped smoking in January 2005 when she had aortic aneurysm repair. Smoked 1.5-2 ppd x 15 - 20 years.  Later on macitentan.  I had her do a TEE in 6/16.  She had a loud MR murmur.  TEE showed very eccentric, posteriorly-directed MR that may be from subtle prolapse of the anterior mitral leaflet.  I am concerned that the MR could be severe but very difficult to fully visualize.  She additionally had PFTs done that were abnormal, but according to her pulmonologist Melvyn Novas), the obstruction was really only minimal and the restriction was primarily related to body habitus.  He does not think that we can explain her hypoxemia and dyspnea primarily from parenchymal lung pathology.  I sent her to [facility] for evaluation for percutaneous mitral valve clip.  They did not think that she was a good candidate.   She has been anemic and received 1 unit PRBCs in 8/16.  She saw GI and was noted to be heme negative, no scopes were done (heme negative and high risk). She has had iron infusions.   Opsumit was stopped in 9/16 due to suspicion for group 2 pulmonary hypertension only (low PVR) and she felt better off it.   She was admitted in 1/17 with increased dyspnea and was found to be profoundly anemic.  She had a total of 5 units PRBCs and an iron infusion in the hospital.  Stool was FOBT+, melena.  EGD, colonoscopy, and capsule endoscopy were all done without clear source of blood loss.  While in the hospital, she was noted to go into atrial  fibrillation with RVR.  She initially required amiodarone for rate control, but was transitioned over to Toprol XL. She was not anticoagulated due to concern for GI blood loss and anemia.  Anticoagulation was attempted later.   RHC in 3/17 showed elevated right heart filling pressures and mildly elevated PCWP.  There was mild pulmonary hypertension, likely pulmonary venous hypertension with PVR 1.9 WU.   She was admitted in 7/17 with symptomatic anemia, received 3 units PRBCs.  Anticoagulation was stopped.  Small bowel enteroscopy did not show a source of bleeding.    She was seen by Dr Rayann Heman, he recommended against Watchman. She is now off anticoagulation.  Has had Fe infusions and getting Aranesp.   Admitted 12/7 through 07/23/16. Treated for UTI, sepsis and volume overload. Discharged to [facility] SNF then later home.   Recently seen with increased dyspnea.  Repeat echo showed severe MR so TEE was done.  TEE in 4/19 showed severe MR with partial flail A2 scallop and moderate-severe TR.   In 6/19, she was admitted for successful Mitraclip placement.  She was treated for CHF in the hospital for several days after this, then discharged to SNF.   She returns for follow up of CHF.  She remains in SNF.  Still feels very weak overall.  Doing PT.  She continues on home oxygen, 3 L by nasal cannula.  She thinks her  breathing is better since Mitraclip.  She is fatigued but not particularly short of breath after walking about 50 feet.  No lightheadedness.  No chest pain.  No orthopnea/PND.  Weight is down 3 lbs compared to prior appt.  Post-procedure echo in 6/19 showed Mitraclip in place with mild mitral stenosis and trivial to mild MR.   Labs 02/05/13 K 3.8 Creatinine 1.37 05/08/13 K 3.8, Creatinine 1.58 11/14 K 4.1, creatinine 1.82 08/09/2014: K 4.0 Creatinine 1.45  5/16: K 3.7, creatinine 1.76, BNP 447 6/16: K 5 => 4.6, creatinine 2.09 => 2.13, BNP 291 8/16: K 4.7, creatinine 2.1, HCT 30.7 9/16: K  4.5, creatinine 2.3, HCT 34.1 1/17: K 4.8, creatinine 1.84 => 1.87, HCT 29.7, plts 136, FOBT+, BNP 403 2/17: K 3.5, creatinine 1.89, BNP 329, hgb 10 3/17: K 3.8, creatinine 2.5 => 2.3, HCT 35.4 => 33.5 5/17: K 3.7, creatinine 2.0, hgb 10.6 7/17: K 3.7, creatinine 2, hgb 9.9 8/17: K 3.8, creatinine 1.9, HCT 31.3 9/17: K 3.9, creatinine 2 07/23/2016: K 4.3 Creatinine 2.76  08/02/2016: K 4.7 Creatinine 2.0  4/18: K 4, creatinine 2.3, hgb 10.7 8/18: K 4, creatinine 2.13, hgb 10.4, plts 83 9/18: LDL 59 11/18: K 4, creatinine 2.2 12/18: hgb 10.7 4/19: K 4.2, creatinine 2.6 6/19: K 3.5, creatnine 3.69 7/19: K 4.4, creatinine 2.79  1) COPD: Former smoker (28 pk-yrs).  On home O2 - 4L/min rest, up to 5L/min exertion.  - spirometry 6/13 in [place] = 0.85L FVC = 1.3L no DLCO measured. FEV1 in 2014 = 0.73  - CT chest 10/14 - moderate centrilobular emphysema. No pulm fibrosis but there was evidence for post-infectious/inflammatory scarring. Stable Asc Ao repair - PFTs (11/14) FVC 46%, FEV1 0.8L (40%), ratio 87%, FEF 25-75% 0.44L DLCO 22%, TLC 50% => PFTs in 06/2013 did NOT suggest significant airflow obstruction according to Dr Gustavus Bryant last note despite "moderate emphysema" on CT.  - PFTs (6/16) were abnormal with severe obstruction, severe restriction, severely decreased DLCO.  Reviewed with Dr Melvyn Novas => he thinks obstruction is actually fairly minimal and that the restriction is due to body habitus.   2) Obesity 3) RA: On plaquenil, sees Dr Lenna Gilford.  4) Type A aortic dissection s/p repair 2005: c/b renal failure requiring HD x 3 months 5) PAH - previously treated in [place].  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  - V/Q scan 11/14 no chronic PE - 2015 Tracleer stopped and started on macitentan. 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  - RHC 08/18/2014: No role for pulmonary vasodilators.  RA = 14 RV = 62/3/15 PA = 63/21 (38) PCW = mean 23 v waves to 40 Fick cardiac  output/index = 6.3/3.2 Thermodilution CO/CI = 6.1/3.1, PVR = 2.0 WU, FA sat = 91% PA sat = 57%, 62% - RHC 3/17: mean RA 13, PA 44/2 mean 29, mean PCWP 17, CI 3.04, PVR 1.9 WU => pulmonary venous hypertension. 6) OSA 7) Diastolic CHF - ECHO 9/62 EF 55-60% RV mildly dilated with normal function. Mild to moderate posterior MR. RVSP 56. Probable restrictive diastolic filling pattern - ECHO 9/15 EF 60-65% RV mildly dilated with normal function. Mild to moderate posterior MR. RVSP 40. Ao Root ok - TEE 6/16 with EF 55-60%, D-shaped interventricular septum suggestive of RV pressure/volume overload, RV mildly dilated with normal systolic function, peak RV-RA gradient 57 mmHg, very eccentric posteriorly-directed mitral regurgitation, possibly severe, etiology may be a very small area of prolapse on the anterior leaflet, s/p  ascending aorta repair with residual dissection flap in the descending thoracic aorta.   - Echo (1/17) with EF 55-60%, mild to moderate MR, PASP 66 - Echo (12/17) with EF 55-60%, grade II diastolic dysfunction, mild MR, mildly decreased RV systolic function, PASP 66 mmHg.  8) CKD 9) Mitral regurgitation: Possibly severe by 6/16 TEE, very eccentric.  Not good candidate for percutaneous MV clip (seen at [facility]).  Echo (1/17) with only mild to moderate MR.  - TEE (4/19): EF 55-60%, severe MR with partial flail A2 scallop, mildly decreased RV systolic function, moderate-severe TR, peak RV-RA gradient 43 mmHg.  - Mitraclip placement 6/19.  - Echo (6/19, post-Mitraclip): EF 60-65%, mild AS, IV septal flattening, s/p Mitraclip with mean gradient 5 mmHg and MVA 1.6 cm^2 by PHT, trivial-mild MR, severe TR, PASP 60 mmHg.  10) Anemia: Suspect from chronic disease/renal disease and well as chronic GI bleeding.  EGD/colonoscopy/capsule endoscopy in 1/17 did not show any definite site for blood loss.  Small bowel enteroscopy in 7/17 did not show etiology of bleeding. 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   Getting Fe infusions.  11) Atrial fibrillation: Persistent, 1st noted in 1/17.  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   ROS: All systems reviewed and negative except as per HPI.  Current Outpatient Medications on File Prior to Encounter  Medication Sig Dispense Refill  . acetaminophen (TYLENOL) 500 MG tablet Take 1,000 mg by mouth every 6 (six) hours as needed for moderate pain or headache.    . allopurinol (ZYLOPRIM) 300 MG tablet Take 300 mg by mouth daily.    [name] ALPRAZolam (XANAX) 0.25 MG tablet Take 1 tablet (0.25 mg total) by mouth every 12 (twelve) hours as needed for anxiety or sleep. (Patient taking differently: Take 0.25 mg by mouth at bedtime. ) 60 tablet 5  . aspirin 81 MG chewable tablet Chew 1 tablet (81 mg total) by mouth daily. 30 tablet 6  . atorvastatin (LIPITOR) 10 MG tablet Take 1 tablet (10 mg total) by mouth daily. 90 tablet 2  . Biotin 5000 MCG CAPS Take 5,000 mcg by mouth daily.     [name] gabapentin (NEURONTIN) 100 MG capsule Take 100 mg by mouth daily.    . hydroxychloroquine (PLAQUENIL) 200 MG tablet Take 200 mg by mouth 2 (two) times daily.    . Multiple Vitamin (MULTIVITAMIN WITH MINERALS) TABS tablet Take 1 tablet by mouth daily.    . Omega-3 Fatty Acids (FISH OIL) 1200 MG CAPS Take 1,200 mg by mouth daily.    . OXYGEN Inhale 3 L into the lungs continuous.     . Polyethylene Glycol 400 (BLINK TEARS OP) Place 1-2 drops into both eyes at bedtime.    . potassium chloride SA (K-DUR,KLOR-CON) 20 MEQ tablet Take 2 tablets (40 mEq total) by mouth daily. 60 tablet 6   No current facility-administered medications on file prior to encounter.    Social History   Socioeconomic History  . Marital status: Legally Separated    Spouse name: Not on file  . Number of children: 1  . Years of education: Not on file  . Highest education level: Not on file  Occupational History  . Occupation: Disability    Comment: Office Work  Scientific laboratory technician  . Financial  resource strain: Not on file  . Food insecurity:    Worry: Not on file    Inability: Not on file  . Transportation needs:    Medical: Not on file    Non-medical: Not on  file  Tobacco Use  . Smoking status: Former Smoker    Packs/day: 1.50    Years: 14.00    Pack years: 21.00    Types: Cigarettes    Last attempt to quit: 08/09/2003    Years since quitting: 14.5  . Smokeless tobacco: Never Used  Substance and Sexual Activity  . Alcohol use: Yes    Alcohol/week: 0.0 oz    Comment: 02/20/2016 "drank a little in my teens"  . Drug use: No  . Sexual activity: Never  Lifestyle  . Physical activity:    Days per week: Not on file    Minutes per session: Not on file  . Stress: Not on file  Relationships  . Social connections:    Talks on phone: Not on file    Gets together: Not on file    Attends religious service: Not on file    Active member of club or organization: Not on file    Attends meetings of clubs or organizations: Not on file    Relationship status: Not on file  Other Topics Concern  . Not on file  Social History Narrative  . Not on file   Family History  Problem Relation Age of Onset  . Heart attack Mother   . Prostate cancer Father   . Diabetes Brother   . Kidney failure Brother   . Breast cancer Maternal Aunt   . Colon cancer Neg Hx   . Stomach cancer Neg Hx     Vitals:   02/18/18 0953  BP: (!) 102/56  Pulse: 72  SpO2: 100%  Weight: 171 lb 12.8 oz (77.9 kg)   General: NAD Neck: JVP 9-10 cm, no thyromegaly or thyroid nodule.  Lungs: Clear to auscultation bilaterally with normal respiratory effort. CV: Nondisplaced PMI.  Heart irregular S1/S2, no S3/S4, 3/6 early SEM RUSB with clear S2.  1+ edema to knees.  No carotid bruit.  Normal pedal pulses.  Abdomen: Soft, nontender, no hepatosplenomegaly, no distention.  Skin: Intact without lesions or rashes.  Neurologic: Alert and oriented x 3.  Psych: Normal affect. Extremities: No clubbing or cyanosis.   HEENT: Normal.   Assessment/Plan: 1. Chronic diastolic CHF with pulmonary hypertension/RV failure:  She remains volume overloaded on exam.  This is difficult to control due to elevated creatinine.  NYHA class III-IIIb symptoms.  She is less short of breath since Mitraclip but still gets very fatigued.  - Increase torsemide to 80 qam/40 qpm.  BMET today and again in 10 days. - She should wear graded compression stockings during the day.  2. Chronic respiratory failure:  She is on 3 liters at rest, 4-5 L with exertion home oxygen by [place]. She has COPD and restriction from her body habitus (OHS).  3. OSA: Continue CPAP nightly.  4. CKD: Stage III-IV. BMET today as above.  - Refer to nephrology. 5. Pulmonary hypertension: This is primarily [place] group 2 (pulmonary venous hypertension) based on RHC in 3/17.  She is not a good candidate for pulmonary vasodilators.   6. Mitral regurgitation:  Severe MR on 4/19 TEE due to partial flail of the anterior leaflet (A2 scallop). Now s/p Mitraclip in 6/19.  Post-procedure echo in 6/19 showed mild April, trivial-mild MR.   - She is on ASA 81 post-Mitraclip.  7. Anemia: Suspect anemia of chronic disease/renal disease but also with component of GI blood loss.  She has had GI bleeding with FOBT+, but unable to visualize bleeding source on EGD, colonoscopy, enteroscopy, or  capsule endoscopy (most recently in 7/17).  Seeing hematology (Dr. Irene Limbo), getting Aranesp and Fe infusions with Fe deficiency and possible MDS.  - CBC today.  8. Atrial fibrillation: Persistent.  Rate is controlled.  CHADSVASC = 3.  She was evaluated for Watchman but decided not to be a good candidate. Due to recurrent severe GI bleeding, anticoagulation was stopped altogether. No stroke-like symptoms.  9. 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 CBC today.   Followup with NP/PA in 2 wks and me in 1 month.   [name] MD 02/18/2018

## 2018-02-18 NOTE — Patient Instructions (Signed)
INCREASE Torsemide to 80mg  in the AM and 40mg  in the PM.  Routine lab work today. Will notify you of abnormal results  Repeat labs in 10 days  **Please wear compression stockings during the day**  **Please check and record your weight daily**   Follow up in 2 weeks with APP,  Follow up in 1 month with Dr.McLean

## 2018-02-19 ENCOUNTER — Telehealth (HOSPITAL_COMMUNITY): Payer: Self-pay

## 2018-02-19 DIAGNOSIS — I5032 Chronic diastolic (congestive) heart failure: Secondary | ICD-10-CM

## 2018-02-19 NOTE — Telephone Encounter (Signed)
Notes recorded by Shirley Muscat, RN on 02/19/2018 at 11:25 AM EDT Will get at f/u w/APP ------  Notes recorded by Harvie Junior, CMA on 02/19/2018 at 11:24 AM EDT Lab visit scheduled for 7/26. ------  Notes recorded by Larey Dresser, MD on 02/18/2018 at 9:25 PM EDT Hemoglobin better but creatinine higher. No changes at this point but make sure she has followup with renal. Will need repeat BMET 1 week.

## 2018-02-25 ENCOUNTER — Ambulatory Visit: Payer: Medicare Other | Admitting: Emergency Medicine

## 2018-02-25 ENCOUNTER — Encounter: Payer: Self-pay | Admitting: Emergency Medicine

## 2018-02-25 VITALS — BP 110/66 | HR 71 | Ht 64.0 in | Wt 175.2 lb

## 2018-02-25 DIAGNOSIS — Z9989 Dependence on other enabling machines and devices: Secondary | ICD-10-CM | POA: Diagnosis not present

## 2018-02-25 DIAGNOSIS — G4733 Obstructive sleep apnea (adult) (pediatric): Secondary | ICD-10-CM | POA: Diagnosis not present

## 2018-02-25 NOTE — Assessment & Plan Note (Signed)
Continue Stiolto Albuterol as needed  

## 2018-02-25 NOTE — Assessment & Plan Note (Signed)
No indication for pulmonary vasodilators.

## 2018-02-25 NOTE — Assessment & Plan Note (Signed)
Based on her most recent titration study her CPAP need to be changed to 9 cmH2O +3 L/min oxygen bled in.

## 2018-02-25 NOTE — Assessment & Plan Note (Signed)
She was desaturated with ambulation today.  I believe she needs to be on continuous oxygen.  Of asked her to use 3 L/min continuous at rest, increase to 4 to 6 L/min continuous with exertion.

## 2018-02-25 NOTE — Progress Notes (Signed)
Subjective:    Patient ID: April Mueller, female    DOB: 09-05-1945, 72 y.o.   MRN: [id number]  HPI  ROV 12/17/17 --this follow-up visit for 72 year old woman with a history of rheumatoid arthritis on Plaquenil, remote treated latent TB, hypertension and diastolic CHF, COPD, obstructive sleep apnea and mitral valve disease.  She has secondary pulmonary hypertension in the setting of all the above, is managed with torsemide, being adjusted by Dr Aundra Dubin.  She is been treated remotely with targeted [place] therapy but is not on this currently. She also has a history of iron deficiency anemia. Her most recent Hgb was 10.2 in April. She is under evaluation for progressive mitral regurgitation, ? Whether she would be a candidate for intervention.   She feels that she is having less energy for the last, her breathing is limiting her with almost any activity including walking, ADLs.    She had PFT 12/04/17 that I have reviewed. This shows severe mixed obstruction and restriction, restricted volumes, decreased DLCO. She has tried Anoro a few years ago, was unsure that she benefited.   She is compliant with CPAP, gets some clinical benefit. She sleeps well. She does still have to get up at night to urinate. She naps during the day. Works w [employer]. She is wearing   ROV 02/25/18 --patient has a history of rheumatoid arthritis on Plaquenil, remote treated latent TB, hypertension and diastolic CHF, COPD with obstructive sleep apnea.  She underwent mitraclip at the end of June, course complicated by acute respiratory failure, volume overload with cardiogenic pulmonary edema.  She required aggressive diuresis.  She continues to have significant exertional shortness of breath.  Her most recent echocardiogram showed that her MR was improved.  She is on stable torsemide, most recent S Cr 3.43. She is at [facility], working with rehab, strength building, walking. She still feels quite limited. She remains on Stiolto every day. Does  not use albuterol. She is usually on 3L/min continuous at rest, 6L/min pulsed. She is scheduled for labs on Friday, then f/u with Cardiology. Good compliance with her CPAP. She underwent CPAP titration on 6/8 > needs 9cm H2O + 3L/min.    Review of Systems  Constitutional: Negative for fever and unexpected weight change.  HENT: Negative for congestion, dental problem, ear pain, nosebleeds, postnasal drip, rhinorrhea, sinus pressure, sneezing, sore throat and trouble swallowing.   Eyes: Negative for redness and itching.  Respiratory: Positive for shortness of breath. Negative for cough, chest tightness and wheezing.   Cardiovascular: Positive for leg swelling. Negative for palpitations.  Gastrointestinal: Negative for diarrhea, nausea and vomiting.  Genitourinary: Negative for dysuria.  Musculoskeletal: Negative for joint swelling.  Skin: Negative for rash.  Neurological: Negative for headaches.  Hematological: Does not bruise/bleed easily.  Psychiatric/Behavioral: Negative for dysphoric mood. The patient is not nervous/anxious.     Hepatic Function Latest Ref Rng & Units 01/27/2018 06/11/2017 03/11/2017  Total Protein 6.5 - 8.1 g/dL 7.8 7.7 7.5  Albumin 3.5 - 5.0 g/dL 3.4(L) 3.0(L) 3.1(L)  AST 15 - 41 U/L 34 24 22  ALT 14 - 54 U/L _0 Alk Phosphatase 38 - 126 U/L 101 152(H) 133  Total Bilirubin 0.3 - 1.2 mg/dL 1.4(H) 0.64 0.66  Bilirubin, Direct 0.0 - 0.3 mg/dL - - -    08/18/14 --  Findings:  RA = 14 RV = 62/3/15 PA = 63/21 (38) PCW = mean 23 v waves to 40 Fick cardiac output/index = 6.3/3.2 Thermodilution CO/CI =  6.1/3.1 PVR = 2.0 WU FA sat = 91% PA sat = 57%, 62%  Assessment: 1. Mild to moderate pulmonary HTN with normal PVR which suggests pulmonary venous HTN 2. Moderately elevated left-sided pressures in the setting of her not taking lasix this am 3. Normal cardiac output      Objective:   Physical Exam Vitals:   02/25/18 1034  BP: 110/66  Pulse: 71  SpO2:  99%  Weight: 175 lb 3.2 oz (79.5 kg)  Height: _0  (1.626 m)   Gen: Pleasant, well-nourished, in no distress,  normal affect  ENT: No lesions,  mouth clear,  oropharynx clear, no postnasal drip  Neck: No JVD, no stridor  Lungs: No use of accessory muscles, distant, clear without rales or rhonchi  Cardiovascular: RRR, 2/6 M  Musculoskeletal: No deformities, no cyanosis or clubbing, 2+ LE edema with compression hose on  Neuro: alert, non focal  Skin: Warm, no lesions or rashes   Study Conclusions 12/17/12 --  - Left ventricle: The cavity size was normal. Wall thickness was normal. Systolic function was normal. The estimated ejection fraction was in the range of 55% to 60%. Wall motion was normal; there were no regional wall motion abnormalities. - Mitral valve: MR is eccentric and directed posteriorl somewhat hard to judge extent Mildly calcified annulus. Mildly thickened leaflets . Mild to moderate regurgitation. - Left atrium: The atrium was mildly dilated. - Right ventricle: The cavity size was mildly dilated. - Right atrium: The atrium was mildly dilated. - Atrial septum: The septum bowed from left to right, consistent with increased left atrial pressure. - Pulmonary arteries: PA peak pressure: 56m Hg (S).      Assessment & Plan:  Secondary pulmonary arterial hypertension No indication for pulmonary vasodilators.   OSA on CPAP Based on her most recent titration study her CPAP need to be changed to 9 cmH2O +3 L/min oxygen bled in.  Chronic respiratory failure with hypoxia ([place] She was desaturated with ambulation today.  I believe she needs to be on continuous oxygen.  Of asked her to use 3 L/min continuous at rest, increase to 4 to 6 L/min continuous with exertion.   COPD with chronic bronchitis and emphysema ([place] Continue Stiolto.  Albuterol as needed.  Severe mitral regurgitation Remains debilitated following mitral clip placement.  Hopefully with  rehabilitation her functional capacity will ultimately surpass her preprocedure function.  She has improvement in her mitral regurgitation based on echocardiogram.  She is doing rehab, diuretics as directed by the CHF clinic.  RBaltazar Apo MD, PhD 02/25/2018, 11:01 AM [facility] [phone] if no answer [phone]

## 2018-02-25 NOTE — Patient Instructions (Signed)
Please continue Stiolto once daily as you have been using it. Continue your oxygen at 3 L/min continuously at rest.  Increase to 4 to 6 L/min with exertion. Continue your cardiac medications as managed by Dr. Algernon Huxley in the advanced CHF clinic.  Get your lab work as planned. We will send an order to [place] to change her CPAP pressure to 9 cm water +3 L/min oxygen bled into the system. Continue your rehab as planned.  Follow with Dr Lamonte Sakai in 3 months or sooner if you have any problems.

## 2018-02-25 NOTE — Assessment & Plan Note (Signed)
Remains debilitated following mitral clip placement.  Hopefully with rehabilitation her functional capacity will ultimately surpass her preprocedure function.  She has improvement in her mitral regurgitation based on echocardiogram.  She is doing rehab, diuretics as directed by the CHF clinic.

## 2018-02-27 ENCOUNTER — Telehealth (HOSPITAL_COMMUNITY): Payer: Self-pay

## 2018-02-27 NOTE — Telephone Encounter (Signed)
I called April Mueller to see if she was home from the rehabilitation facility yet. She stated she was still there and did not have an idea as to when she would be discharged. I will contact the clinic and ask what they would like to do with her referral.

## 2018-02-28 ENCOUNTER — Other Ambulatory Visit (HOSPITAL_COMMUNITY): Payer: Medicare Other

## 2018-03-03 ENCOUNTER — Encounter (HOSPITAL_COMMUNITY): Payer: Self-pay

## 2018-03-03 ENCOUNTER — Ambulatory Visit (HOSPITAL_COMMUNITY)
Admission: RE | Admit: 2018-03-03 | Discharge: 2018-03-03 | Disposition: A | Discharge status: Home / Self Care | Payer: Medicare Other | Source: Ambulatory Visit | Attending: Internal Medicine | Admitting: Internal Medicine

## 2018-03-03 VITALS — BP 86/54 | HR 75 | Wt 172.0 lb

## 2018-03-03 DIAGNOSIS — D696 Thrombocytopenia, unspecified: Secondary | ICD-10-CM | POA: Insufficient documentation

## 2018-03-03 DIAGNOSIS — Z833 Family history of diabetes mellitus: Secondary | ICD-10-CM | POA: Diagnosis not present

## 2018-03-03 DIAGNOSIS — G4733 Obstructive sleep apnea (adult) (pediatric): Secondary | ICD-10-CM | POA: Diagnosis not present

## 2018-03-03 DIAGNOSIS — Z79899 Other long term (current) drug therapy: Secondary | ICD-10-CM | POA: Insufficient documentation

## 2018-03-03 DIAGNOSIS — Z95818 Presence of other cardiac implants and grafts: Secondary | ICD-10-CM | POA: Diagnosis not present

## 2018-03-03 DIAGNOSIS — R0602 Shortness of breath: Secondary | ICD-10-CM | POA: Diagnosis present

## 2018-03-03 DIAGNOSIS — I272 Pulmonary hypertension, unspecified: Secondary | ICD-10-CM | POA: Diagnosis not present

## 2018-03-03 DIAGNOSIS — Z87891 Personal history of nicotine dependence: Secondary | ICD-10-CM | POA: Diagnosis not present

## 2018-03-03 DIAGNOSIS — J449 Chronic obstructive pulmonary disease, unspecified: Secondary | ICD-10-CM | POA: Diagnosis not present

## 2018-03-03 DIAGNOSIS — R32 Unspecified urinary incontinence: Secondary | ICD-10-CM | POA: Diagnosis present

## 2018-03-03 DIAGNOSIS — Z841 Family history of disorders of kidney and ureter: Secondary | ICD-10-CM | POA: Diagnosis not present

## 2018-03-03 DIAGNOSIS — D631 Anemia in chronic kidney disease: Secondary | ICD-10-CM | POA: Insufficient documentation

## 2018-03-03 DIAGNOSIS — Z9981 Dependence on supplemental oxygen: Secondary | ICD-10-CM | POA: Diagnosis not present

## 2018-03-03 DIAGNOSIS — J9610 Chronic respiratory failure, unspecified whether with hypoxia or hypercapnia: Secondary | ICD-10-CM | POA: Diagnosis not present

## 2018-03-03 DIAGNOSIS — I340 Nonrheumatic mitral (valve) insufficiency: Secondary | ICD-10-CM | POA: Diagnosis not present

## 2018-03-03 DIAGNOSIS — N184 Chronic kidney disease, stage 4 (severe): Secondary | ICD-10-CM | POA: Diagnosis not present

## 2018-03-03 DIAGNOSIS — J9611 Chronic respiratory failure with hypoxia: Secondary | ICD-10-CM | POA: Diagnosis not present

## 2018-03-03 DIAGNOSIS — I482 Chronic atrial fibrillation, unspecified: Secondary | ICD-10-CM

## 2018-03-03 DIAGNOSIS — Z8249 Family history of ischemic heart disease and other diseases of the circulatory system: Secondary | ICD-10-CM | POA: Diagnosis not present

## 2018-03-03 DIAGNOSIS — I481 Persistent atrial fibrillation: Secondary | ICD-10-CM | POA: Insufficient documentation

## 2018-03-03 DIAGNOSIS — I5032 Chronic diastolic (congestive) heart failure: Secondary | ICD-10-CM

## 2018-03-03 DIAGNOSIS — Z7982 Long term (current) use of aspirin: Secondary | ICD-10-CM | POA: Diagnosis not present

## 2018-03-03 DIAGNOSIS — I130 Hypertensive heart and chronic kidney disease with heart failure and stage 1 through stage 4 chronic kidney disease, or unspecified chronic kidney disease: Secondary | ICD-10-CM | POA: Diagnosis not present

## 2018-03-03 LAB — BASIC METABOLIC PANEL
ANION GAP: 7 (ref 5–15)
BUN: 77 mg/dL — ABNORMAL HIGH (ref 8–23)
CALCIUM: 9.9 mg/dL (ref 8.9–10.3)
CO2: 29 mmol/L (ref 22–32)
Chloride: 108 mmol/L (ref 98–111)
Creatinine, Ser: 3.56 mg/dL — ABNORMAL HIGH (ref 0.44–1.00)
GFR calc Af Amer: 14 mL/min — ABNORMAL LOW (ref >60)
GFR calc non Af Amer: 12 mL/min — ABNORMAL LOW (ref >60)
Glucose, Bld: 108 mg/dL — ABNORMAL HIGH (ref 70–99)
Potassium: 4.2 mmol/L (ref 3.5–5.1)
Sodium: 144 mmol/L (ref 135–145)

## 2018-03-03 MED ORDER — TORSEMIDE 20 MG PO TABS: 60.0000 mg | ORAL_TABLET | Freq: Two times a day (BID) | ORAL | 11 refills | Status: DC

## 2018-03-03 NOTE — Progress Notes (Signed)
Patient ID: April Mueller, female   DOB: Dec 22, 1945, 72 y.o.   MRN: [id number]   HPI  PCP: Dr Harrington Challenger Pulmonary: Dr Byrum/Dr Melvyn Novas  Cardiology: Dr. Aundra Dubin  April Mueller is a 72 y.o. with COPD on home oxygen, rheumatoid arthritis, chronic atrial fibrillation, chronic diastolic CHF, CKD.    Moved back from [place] and saw Dr. Lamonte Sakai. In [place] was followed by cardiology and pulmonary. Started on Tracleer in 2006. Revatio was added, however was discontinued without any change in her symptoms. Stopped smoking in January 2005 when she had aortic aneurysm repair. Smoked 1.5-2 ppd x 15 - 20 years.  Later on macitentan.  I had her do a TEE in 6/16.  She had a loud MR murmur.  TEE showed very eccentric, posteriorly-directed MR that may be from subtle prolapse of the anterior mitral leaflet.  I am concerned that the MR could be severe but very difficult to fully visualize.  She additionally had PFTs done that were abnormal, but according to her pulmonologist Melvyn Novas), the obstruction was really only minimal and the restriction was primarily related to body habitus.  He does not think that we can explain her hypoxemia and dyspnea primarily from parenchymal lung pathology.  I sent her to [facility] for evaluation for percutaneous mitral valve clip.  They did not think that she was a good candidate.   She has been anemic and received 1 unit PRBCs in 8/16.  She saw GI and was noted to be heme negative, no scopes were done (heme negative and high risk). She has had iron infusions.   Opsumit was stopped in 9/16 due to suspicion for group 2 pulmonary hypertension only (low PVR) and she felt better off it.   She was admitted in 1/17 with increased dyspnea and was found to be profoundly anemic.  She had a total of 5 units PRBCs and an iron infusion in the hospital.  Stool was FOBT+, melena.  EGD, colonoscopy, and capsule endoscopy were all done without clear source of blood loss.  While in the hospital, she was noted to go into atrial  fibrillation with RVR.  She initially required amiodarone for rate control, but was transitioned over to Toprol XL. She was not anticoagulated due to concern for GI blood loss and anemia.  Anticoagulation was attempted later.   RHC in 3/17 showed elevated right heart filling pressures and mildly elevated PCWP.  There was mild pulmonary hypertension, likely pulmonary venous hypertension with PVR 1.9 WU.   She was admitted in 7/17 with symptomatic anemia, received 3 units PRBCs.  Anticoagulation was stopped.  Small bowel enteroscopy did not show a source of bleeding.    She was seen by Dr Rayann Heman, he recommended against Watchman. She is now off anticoagulation.  Has had Fe infusions and getting Aranesp.   Admitted 12/7 through 07/23/16. Treated for UTI, sepsis and volume overload. Discharged to [facility] then later home.   Recently seen with increased dyspnea.  Repeat echo showed severe MR so TEE was done.  TEE in 4/19 showed severe MR with partial flail A2 scallop and moderate-severe TR.   In 6/19, she was admitted for successful Mitraclip placement.  Required short term intubation. She was treated for CHF in the hospital for several days after this, then discharged to SNF.   Today she returns for HF follow up.She is current at [facility].  Overall feeling a little stronger. SOB with exertion. Denies PND/Orthopnea. Continues to wear 3 liters oxygen. Incontinent  urine. Appetite ok. No fever or chills. All medications provided at the SNF.    Labs 02/05/13 K 3.8 Creatinine 1.37 05/08/13 K 3.8, Creatinine 1.58 11/14 K 4.1, creatinine 1.82 08/09/2014: K 4.0 Creatinine 1.45  5/16: K 3.7, creatinine 1.76, BNP 447 6/16: K 5 => 4.6, creatinine 2.09 => 2.13, BNP 291 8/16: K 4.7, creatinine 2.1, HCT 30.7 9/16: K 4.5, creatinine 2.3, HCT 34.1 1/17: K 4.8, creatinine 1.84 => 1.87, HCT 29.7, plts 136, FOBT+, BNP 403 2/17: K 3.5, creatinine 1.89, BNP 329, hgb 10 3/17: K 3.8, creatinine 2.5  => 2.3, HCT 35.4 => 33.5 5/17: K 3.7, creatinine 2.0, hgb 10.6 7/17: K 3.7, creatinine 2, hgb 9.9 8/17: K 3.8, creatinine 1.9, HCT 31.3 9/17: K 3.9, creatinine 2 07/23/2016: K 4.3 Creatinine 2.76  08/02/2016: K 4.7 Creatinine 2.0  4/18: K 4, creatinine 2.3, hgb 10.7 8/18: K 4, creatinine 2.13, hgb 10.4, plts 83 9/18: LDL 59 11/18: K 4, creatinine 2.2 12/18: hgb 10.7 4/19: K 4.2, creatinine 2.6 6/19: K 3.5, creatnine 3.69 7/19: K 4.4, creatinine 2.79 02/18/2018 K 4.8 Creatinine 3.43  1) COPD: Former smoker (28 pk-yrs).  On home O2 - 4L/min rest, up to 5L/min exertion.  - spirometry 6/13 in [place] = 0.85L FVC = 1.3L no DLCO measured. FEV1 in 2014 = 0.73  - CT chest 10/14 - moderate centrilobular emphysema. No pulm fibrosis but there was evidence for post-infectious/inflammatory scarring. Stable Asc Ao repair - PFTs (11/14) FVC 46%, FEV1 0.8L (40%), ratio 87%, FEF 25-75% 0.44L DLCO 22%, TLC 50% => PFTs in 06/2013 did NOT suggest significant airflow obstruction according to Dr Gustavus Bryant last note despite "moderate emphysema" on CT.  - PFTs (6/16) were abnormal with severe obstruction, severe restriction, severely decreased DLCO.  Reviewed with Dr Melvyn Novas => he thinks obstruction is actually fairly minimal and that the restriction is due to body habitus.   2) Obesity 3) RA: On plaquenil, sees Dr Lenna Gilford.  4) Type A aortic dissection s/p repair 2005: c/b renal failure requiring HD x 3 months 5) PAH - previously treated in [place].  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  - V/Q scan 11/14 no chronic PE - 2015 Tracleer stopped and started on macitentan. 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  - RHC 08/18/2014: No role for pulmonary vasodilators.  RA = 14 RV = 62/3/15 PA = 63/21 (38) PCW = mean 23 v waves to 40 Fick cardiac output/index = 6.3/3.2 Thermodilution CO/CI = 6.1/3.1, PVR = 2.0 WU, FA sat = 91% PA sat = 57%, 62% - RHC 3/17: mean RA 13, PA 44/2 mean 29, mean PCWP 17, CI  3.04, PVR 1.9 WU => pulmonary venous hypertension. 6) OSA 7) Diastolic CHF - ECHO 9/62 EF 55-60% RV mildly dilated with normal function. Mild to moderate posterior MR. RVSP 56. Probable restrictive diastolic filling pattern - ECHO 9/15 EF 60-65% RV mildly dilated with normal function. Mild to moderate posterior MR. RVSP 40. Ao Root ok - TEE 6/16 with EF 55-60%, D-shaped interventricular septum suggestive of RV pressure/volume overload, RV mildly dilated with normal systolic function, peak RV-RA gradient 57 mmHg, very eccentric posteriorly-directed mitral regurgitation, possibly severe, etiology may be a very small area of prolapse on the anterior leaflet, s/p ascending aorta repair with residual dissection flap in the descending thoracic aorta.   - Echo (1/17) with EF 55-60%, mild to moderate MR, PASP 66 - Echo (12/17) with EF 55-60%, grade II diastolic dysfunction, mild MR, mildly  decreased RV systolic function, PASP 66 mmHg.  8) CKD 9) Mitral regurgitation: Possibly severe by 6/16 TEE, very eccentric.  Not good candidate for percutaneous MV clip (seen at [facility]).  Echo (1/17) with only mild to moderate MR.  - TEE (4/19): EF 55-60%, severe MR with partial flail A2 scallop, mildly decreased RV systolic function, moderate-severe TR, peak RV-RA gradient 43 mmHg.  - Mitraclip placement 6/19.  - Echo (6/19, post-Mitraclip): EF 60-65%, mild AS, IV septal flattening, s/p Mitraclip with mean gradient 5 mmHg and MVA 1.6 cm^2 by PHT, trivial-mild MR, severe TR, PASP 60 mmHg.  10) Anemia: Suspect from chronic disease/renal disease and well as chronic GI bleeding.  EGD/colonoscopy/capsule endoscopy in 1/17 did not show any definite site for blood loss.  Small bowel enteroscopy in 7/17 did not show etiology of bleeding. 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  Getting Fe infusions.  11) Atrial fibrillation: Persistent, 1st noted in 1/17.  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   ROS: All  systems reviewed and negative except as per HPI.  Current Outpatient Medications on File Prior to Encounter  Medication Sig Dispense Refill  . allopurinol (ZYLOPRIM) 300 MG tablet Take 300 mg by mouth daily.    [name] ALPRAZolam (XANAX) 0.25 MG tablet Take 1 tablet (0.25 mg total) by mouth every 12 (twelve) hours as needed for anxiety or sleep. (Patient taking differently: Take 0.25 mg by mouth at bedtime. ) 60 tablet 5  . aspirin 81 MG chewable tablet Chew 1 tablet (81 mg total) by mouth daily. 30 tablet 6  . atorvastatin (LIPITOR) 10 MG tablet Take 1 tablet (10 mg total) by mouth daily. 90 tablet 2  . Biotin 5000 MCG CAPS Take 5,000 mcg by mouth daily.     [name] gabapentin (NEURONTIN) 100 MG capsule Take 100 mg by mouth daily.    . hydroxychloroquine (PLAQUENIL) 200 MG tablet Take 200 mg by mouth 2 (two) times daily.    . metolazone (ZAROXOLYN) 2.5 MG tablet Take 2.5 mg by mouth 3 (three) times a week. Tues,Thur, Sat    . Multiple Vitamin (MULTIVITAMIN WITH MINERALS) TABS tablet Take 1 tablet by mouth daily.    . Omega-3 Fatty Acids (FISH OIL) 1200 MG CAPS Take 1,200 mg by mouth daily.    . OXYGEN Inhale 3 L into the lungs continuous.     . Polyethylene Glycol 400 (BLINK TEARS OP) Place 1-2 drops into both eyes at bedtime.    . potassium chloride SA (K-DUR,KLOR-CON) 20 MEQ tablet Take 2 tablets (40 mEq total) by mouth daily. 60 tablet 6  . Tiotropium Bromide-Olodaterol (STIOLTO RESPIMAT) 2.5-2.5 MCG/ACT AERS Inhale into the lungs.    . torsemide (DEMADEX) 20 MG tablet Take by mouth daily. 60 mg in the AM and 40 mg in the PM    . acetaminophen (TYLENOL) 500 MG tablet Take 1,000 mg by mouth every 6 (six) hours as needed for moderate pain or headache.     No current facility-administered medications on file prior to encounter.    Social History   Socioeconomic History  . Marital status: Legally Separated    Spouse name: Not on file  . Number of children: 1  . Years of education: Not on file  .  Highest education level: Not on file  Occupational History  . Occupation: Disability    Comment: Office Work  Scientific laboratory technician  . Financial resource strain: Not on file  . Food insecurity:    Worry: Not on file  Inability: Not on file  . Transportation needs:    Medical: Not on file    Non-medical: Not on file  Tobacco Use  . Smoking status: Former Smoker    Packs/day: 1.50    Years: 14.00    Pack years: 21.00    Types: Cigarettes    Last attempt to quit: 08/09/2003    Years since quitting: 14.5  . Smokeless tobacco: Never Used  Substance and Sexual Activity  . Alcohol use: Yes    Alcohol/week: 0.0 oz    Comment: 02/20/2016 "drank a little in my teens"  . Drug use: No  . Sexual activity: Never  Lifestyle  . Physical activity:    Days per week: Not on file    Minutes per session: Not on file  . Stress: Not on file  Relationships  . Social connections:    Talks on phone: Not on file    Gets together: Not on file    Attends religious service: Not on file    Active member of club or organization: Not on file    Attends meetings of clubs or organizations: Not on file    Relationship status: Not on file  Other Topics Concern  . Not on file  Social History Narrative  . Not on file   Family History  Problem Relation Age of Onset  . Heart attack Mother   . Prostate cancer Father   . Diabetes Brother   . Kidney failure Brother   . Breast cancer Maternal Aunt   . Colon cancer Neg Hx   . Stomach cancer Neg Hx     Vitals:   03/03/18 1401  BP: (!) 86/54  Pulse: 75  SpO2: 100%  Weight: 172 lb (78 kg)   Wt Readings from Last 3 Encounters:  03/03/18 172 lb (78 kg)  02/25/18 175 lb 3.2 oz (79.5 kg)  02/18/18 171 lb 12.8 oz (77.9 kg)    General:  Arrived in a wheel chair. No resp difficulty HEENT: normal Neck: supple. JVP 10-11. Carotids 2+ bilat; no bruits. No lymphadenopathy or thryomegaly appreciated. Cor: PMI nondisplaced. Irregular rate & rhythm. No rubs, gallops.  3/6 SEM RUSB Lungs: clear on 3 liters oxygen  Abdomen: soft, nontender, nondistended. No hepatosplenomegaly. No bruits or masses. Good bowel sounds. Extremities: no cyanosis, clubbing, rash, R and LLE 2+ edema. Ted hose in place.  Neuro: alert & orientedx3, cranial nerves grossly intact. moves all 4 extremities w/o difficulty. Affect pleasant .   Assessment/Plan: 1. Chronic diastolic CHF with pulmonary hypertension/RV failure:   NYHA II(Ib. Volume status mildly elevated. Increase torsemide 60 mg twice a day. Continue metolazone 3 days a week.  -BMET today and in 7 days. - Continue low salt diet and limit fluid intake to < 2 liters per day.    2. Chronic respiratory failure:   She has COPD and restriction from her body habitus (OHS). Continue 3 liters oxygen continuously.  3. OSA: Continue CPAP nightly.  4. CKD: Stage IV.  -Creatinine baseline 2.8-3 - Refer to nephrology. 5. Pulmonary hypertension: This is primarily [place] group 2 (pulmonary venous hypertension) based on RHC in 3/17.  She is not a good candidate for pulmonary vasodilators.   6. Mitral regurgitation:  Severe MR on 4/19 TEE due to partial flail of the anterior leaflet (A2 scallop). Now s/p Mitraclip in 6/19.  Post-procedure echo in 6/19 showed mild April, trivial-mild MR.   - She is on ASA 81 post-Mitraclip.  7. Anemia: Suspect anemia of  chronic disease/renal disease but also with component of GI blood loss.  She has had GI bleeding with FOBT+, but unable to visualize bleeding source on EGD, colonoscopy, enteroscopy, or capsule endoscopy (most recently in 7/17).  Seeing hematology (Dr. Irene Limbo), getting Aranesp and Fe infusions with Fe deficiency and possible MDS.  8. Atrial fibrillation: Persistent.  Rate is controlled.  CHADSVASC = 3.  She was evaluated for Watchman but decided not to be a good candidate. Due to recurrent severe GI bleeding, anticoagulation was stopped altogether. On asa.  No stroke-like symptoms.  9. Chronic  thrombocytopenia: Suspect related to myelodysplastic syndrome (MDS).   Follow up 2 weeks to reassess volume status. Ultimately she plans to return home. Once discharged from SNF she will need HH.   Yosselin Zoeller NP-C  03/03/2018

## 2018-03-03 NOTE — Patient Instructions (Signed)
Routine lab work today. Will notify you of abnormal results, otherwise no news is good news!  INCREASE Torsmide to 60 mg (3 tabs) twice daily.  Will refer you to nephrology. Address: [address] [place], [place] [address] Phone: [phone] Their office will call you in 1-2 weeks to schedule.  Repeat lab in 1 week at facility.  Follow up 2-3 weeks.  Take all medication as prescribed the day of your appointment. Bring all medications with you to your appointment.  Do the following things EVERYDAY: 1) Weigh yourself in the morning before breakfast. Write it down and keep it in a log. 2) Take your medicines as prescribed 3) Eat low salt foods-Limit salt (sodium) to 2000 mg per day.  4) Stay as active as you can everyday 5) Limit all fluids for the day to less than 2 liters

## 2018-03-11 ENCOUNTER — Encounter: Payer: Self-pay | Admitting: Thoracic Surgery (Cardiothoracic Vascular Surgery)

## 2018-03-12 ENCOUNTER — Other Ambulatory Visit: Payer: Self-pay

## 2018-03-12 ENCOUNTER — Ambulatory Visit (INDEPENDENT_AMBULATORY_CARE_PROVIDER_SITE_OTHER): Payer: Medicare Other | Admitting: Physician Assistant

## 2018-03-12 ENCOUNTER — Ambulatory Visit (HOSPITAL_COMMUNITY): Payer: Medicare Other | Attending: Cardiology

## 2018-03-12 ENCOUNTER — Encounter: Payer: Self-pay | Admitting: Physician Assistant

## 2018-03-12 VITALS — BP 96/46 | HR 63 | Ht 64.0 in | Wt 174.4 lb

## 2018-03-12 DIAGNOSIS — I10 Essential (primary) hypertension: Secondary | ICD-10-CM

## 2018-03-12 DIAGNOSIS — G4733 Obstructive sleep apnea (adult) (pediatric): Secondary | ICD-10-CM | POA: Diagnosis not present

## 2018-03-12 DIAGNOSIS — Z95818 Presence of other cardiac implants and grafts: Secondary | ICD-10-CM | POA: Diagnosis not present

## 2018-03-12 DIAGNOSIS — I088 Other rheumatic multiple valve diseases: Secondary | ICD-10-CM | POA: Diagnosis not present

## 2018-03-12 DIAGNOSIS — D649 Anemia, unspecified: Secondary | ICD-10-CM | POA: Insufficient documentation

## 2018-03-12 DIAGNOSIS — I5032 Chronic diastolic (congestive) heart failure: Secondary | ICD-10-CM | POA: Diagnosis not present

## 2018-03-12 DIAGNOSIS — N183 Chronic kidney disease, stage 3 (moderate): Secondary | ICD-10-CM | POA: Diagnosis not present

## 2018-03-12 DIAGNOSIS — I4891 Unspecified atrial fibrillation: Secondary | ICD-10-CM | POA: Insufficient documentation

## 2018-03-12 DIAGNOSIS — N184 Chronic kidney disease, stage 4 (severe): Secondary | ICD-10-CM

## 2018-03-12 DIAGNOSIS — Z9889 Other specified postprocedural states: Secondary | ICD-10-CM | POA: Diagnosis not present

## 2018-03-12 DIAGNOSIS — Z87891 Personal history of nicotine dependence: Secondary | ICD-10-CM | POA: Diagnosis not present

## 2018-03-12 DIAGNOSIS — I509 Heart failure, unspecified: Secondary | ICD-10-CM | POA: Insufficient documentation

## 2018-03-12 DIAGNOSIS — I253 Aneurysm of heart: Secondary | ICD-10-CM | POA: Insufficient documentation

## 2018-03-12 DIAGNOSIS — I130 Hypertensive heart and chronic kidney disease with heart failure and stage 1 through stage 4 chronic kidney disease, or unspecified chronic kidney disease: Secondary | ICD-10-CM | POA: Diagnosis not present

## 2018-03-12 DIAGNOSIS — Z6829 Body mass index (BMI) 29.0-29.9, adult: Secondary | ICD-10-CM | POA: Diagnosis not present

## 2018-03-12 DIAGNOSIS — I272 Pulmonary hypertension, unspecified: Secondary | ICD-10-CM | POA: Insufficient documentation

## 2018-03-12 NOTE — Progress Notes (Signed)
[facility] [place]                                       Cardiology Office Note    Date:  03/13/2018   ID:  April Mueller, DOB 1945-09-17, MRN [id number]  PCP:  Lawerance Cruel, MD  Cardiologist: Dr. Aundra Dubin   CC: 1 month s/p MitraClip  History of Present Illness:  April Mueller is a 72 y.o. female with a history of COPD on home oxygen, rheumatoid arthritis, anemia, chronic atrial fibrillation (not on [place] 2/2 to hx of GI bleeding), chronic diastolic CHF, CKD and severe MR s/p MitraClip (01/29/18) who presents to clinic for follow up.   TEE in 4/19 showed severe MR with partial flail A2 scallop and moderate-severe TR.   On 01/29/18, she was admitted for successful Mitraclip placement. Required short term intubation. She was treated for CHF in the hospital for several days after this, then discharged to SNF. She was not felt to be a candidate for DAPT given history of GI bleeding on oral anticoagulation. For this reason, she was placed on ASA 81 mg daily only.   She was seen back in the CHF clinic on 03/03/18 and torsemide was increased due to slight volume overload. She was also referred to nephrology.   Today she presents to clinic for follow up. She has been staying at [facility]. Review of med list shows she is not being given Aspirin 81 mg daily. She has been quite weak since discharge. She is working with physical therapy to build this back. She has been mostly in a wheelchair but trying to transition to a walker. She hasn't noticed an improvement in her breathing. She continues to have LE edema and stable orthopnea. No PND. No blood in stool or urine. No dizziness or syncope. She is hopeful that she will continue to improve.    Past Medical History:  Diagnosis Date  . Anemia 03/23/15   transfusion  . Aortic aneurysm ([place])   . CHF (congestive heart failure) ([place])   . Chronic lower back pain   . Chronic thoracic  aortic dissection (HCC)    S/P repair ascending thoracic aortic dissection with chronic residual dissection involving transverse aortic arch and descending thoracic aorta  . CKD (chronic kidney disease) stage 3, GFR 30-59 ml/min (HCC)    "on dialysis for 3 months after my aneurysm in 2005" (02/20/2016)  . COPD (chronic obstructive pulmonary disease) ([place])   . Gout   . History of blood transfusion "several"  . Hypertension   . Lupus ([place])   . On home oxygen therapy    "3L; 24/7" (02/20/2016)  . OSA on CPAP   . Pulmonary HTN ([place])   . Rheumatoid arthritis(714.0) 11/06/2012  . S/P aortic dissection repair   . Tuberculosis    dormant carrier    Past Surgical History:  Procedure Laterality Date  . BREAST EXCISIONAL BIOPSY Right    benign  . BREAST LUMPECTOMY Right   . CARDIAC CATHETERIZATION N/A 10/24/2015   Procedure: Right Heart Cath;  Surgeon: Larey Dresser, MD;  Location: [place] [facility];  Service: Cardiovascular;  Laterality: N/A;  . CATARACT EXTRACTION W/ INTRAOCULAR LENS  IMPLANT, BILATERAL Bilateral 2016  . COLONOSCOPY N/A 08/19/2015   Procedure: COLONOSCOPY;  Surgeon: Jerene Bears, MD;  Location: [place] [facility];  Service: Endoscopy;  Laterality: N/A;  . ENTEROSCOPY N/A 02/22/2016   Procedure: ENTEROSCOPY;  Surgeon: Milus Banister, MD;  Location: [place];  Service: Endoscopy;  Laterality: N/A;  . ESOPHAGOGASTRODUODENOSCOPY N/A 08/19/2015   Procedure: ESOPHAGOGASTRODUODENOSCOPY (EGD);  Surgeon: Jerene Bears, MD;  Location: [facility] ENDOSCOPY;  Service: Endoscopy;  Laterality: N/A;  patient scheduled, anesthesia aware of 1500 case, per [place].  08/18/15 DP  . GIVENS CAPSULE STUDY N/A 08/21/2015   Procedure: GIVENS CAPSULE STUDY;  Surgeon: Gatha Mayer, MD;  Location: [place];  Service: Endoscopy;  Laterality: N/A;  . HEMORRHOID SURGERY    . MITRAL VALVE REPAIR N/A 01/29/2018   Procedure: MITRAL VALVE REPAIR;  Surgeon: Sherren Mocha, MD;  Location: [place] [facility];  Service:  Cardiovascular;  Laterality: N/A;  . REPAIR OF ACUTE ASCENDING THORACIC AORTIC DISSECTION  2005  . RIGHT HEART CATHETERIZATION N/A 08/18/2014   Procedure: RIGHT HEART CATH;  Surgeon: Jolaine Artist, MD;  Location[facility];  Service: Cardiovascular;  Laterality: N/A;  . TEE WITHOUT CARDIOVERSION N/A 01/05/2015   Procedure: TRANSESOPHAGEAL ECHOCARDIOGRAM (TEE);  Surgeon: Larey Dresser, MD;  Location: [place];  Service: Cardiovascular;  Laterality: N/A;  . TEE WITHOUT CARDIOVERSION N/A 11/20/2017   Procedure: TRANSESOPHAGEAL ECHOCARDIOGRAM (TEE);  Surgeon: Larey Dresser, MD;  Location: [facility] ENDOSCOPY;  Service: Cardiovascular;  Laterality: N/A;  . WISDOM TOOTH EXTRACTION      Current Medications: Outpatient Medications Prior to Visit  Medication Sig Dispense Refill  . acetaminophen (TYLENOL) 500 MG tablet Take 1,000 mg by mouth every 6 (six) hours as needed for moderate pain or headache.    . ALPRAZolam (XANAX) 0.25 MG tablet Take 1 tablet (0.25 mg total) by mouth every 12 (twelve) hours as needed for anxiety or sleep. (Patient taking differently: Take 0.25 mg by mouth at bedtime. ) 60 tablet 5  . atorvastatin (LIPITOR) 10 MG tablet Take 1 tablet (10 mg total) by mouth daily. 90 tablet 2  . Biotin 5000 MCG CAPS Take 5,000 mcg by mouth daily.     [name] gabapentin (NEURONTIN) 100 MG capsule Take 100 mg by mouth daily.    . hydroxychloroquine (PLAQUENIL) 200 MG tablet Take 200 mg by mouth 2 (two) times daily.    . midodrine (PROAMATINE) 5 MG tablet Take 5 mg by mouth 2 (two) times daily with a meal.    . Multiple Vitamin (MULTIVITAMIN WITH MINERALS) TABS tablet Take 1 tablet by mouth daily.    . Omega-3 Fatty Acids (FISH OIL) 1200 MG CAPS Take 1,200 mg by mouth daily.    . OXYGEN Inhale 3 L into the lungs continuous.     . Polyethylene Glycol 400 (BLINK TEARS OP) Place 1-2 drops into both eyes at bedtime.    . potassium chloride SA (K-DUR,KLOR-CON) 20 MEQ tablet Take 2 tablets (40 mEq  total) by mouth daily. 60 tablet 6  . Tiotropium Bromide-Olodaterol (STIOLTO RESPIMAT) 2.5-2.5 MCG/ACT AERS Inhale into the lungs.    . torsemide (DEMADEX) 20 MG tablet Take 3 tablets (60 mg total) by mouth 2 (two) times daily. 180 tablet 11  . allopurinol (ZYLOPRIM) 300 MG tablet Take 300 mg by mouth daily.    [name] aspirin 81 MG chewable tablet Chew 1 tablet (81 mg total) by mouth daily. (Patient not taking: Reported on 03/12/2018) 30 tablet 6  . metolazone (ZAROXOLYN) 2.5 MG tablet Take 2.5 mg by mouth 3 (three) times a week. Tues,Thur, Sat     No facility-administered medications prior  to visit.      Allergies:   Patient has no known allergies.   Social History   Socioeconomic History  . Marital status: Legally Separated    Spouse name: Not on file  . Number of children: 1  . Years of education: Not on file  . Highest education level: Not on file  Occupational History  . Occupation: Disability    Comment: Office Work  Scientific laboratory technician  . Financial resource strain: Not on file  . Food insecurity:    Worry: Not on file    Inability: Not on file  . Transportation needs:    Medical: Not on file    Non-medical: Not on file  Tobacco Use  . Smoking status: Former Smoker    Packs/day: 1.50    Years: 14.00    Pack years: 21.00    Types: Cigarettes    Last attempt to quit: 08/09/2003    Years since quitting: 14.6  . Smokeless tobacco: Never Used  Substance and Sexual Activity  . Alcohol use: Yes    Alcohol/week: 0.0 standard drinks    Comment: 02/20/2016 "drank a little in my teens"  . Drug use: No  . Sexual activity: Never  Lifestyle  . Physical activity:    Days per week: Not on file    Minutes per session: Not on file  . Stress: Not on file  Relationships  . Social connections:    Talks on phone: Not on file    Gets together: Not on file    Attends religious service: Not on file    Active member of club or organization: Not on file    Attends meetings of clubs or  organizations: Not on file    Relationship status: Not on file  Other Topics Concern  . Not on file  Social History Narrative  . Not on file     Family History:  The patient's family history includes Breast cancer in her maternal aunt; Diabetes in her brother; Heart attack in her mother; Kidney failure in her brother; Prostate cancer in her father.     ROS:   Please see the history of present illness.    ROS All other systems reviewed and are negative.   PHYSICAL EXAM:   VS:  BP (!) 96/46   Pulse 63   Ht 5\' 4"  (1.626 m)   Wt 174 lb 6.4 oz (79.1 kg)   SpO2 99%   BMI 29.94 kg/m    GEN: Well nourished, well developed, in no acute distress  HEENT: normal  Neck: no JVD, carotid bruits, or masses Cardiac: irreg irreg, + murmur. no rubs, or gallops. 2+ bilateral LE edema  Respiratory:  clear to auscultation bilaterally, normal work of breathing GI: soft, nontender, nondistended, + BS MS: no deformity or atrophy  Skin: warm and dry, no rash Neuro:  Alert and Oriented x 3, Strength and sensation are intact Psych: euthymic mood, full affect    Wt Readings from Last 3 Encounters:  03/12/18 174 lb 6.4 oz (79.1 kg)  03/03/18 172 lb (78 kg)  02/25/18 175 lb 3.2 oz (79.5 kg)      Studies/Labs Reviewed:   EKG:  EKG is NOT ordered today.    Recent Labs: 01/27/2018: ALT 22; B Natriuretic Peptide 732.8 02/18/2018: Hemoglobin 9.0; Platelets 77 03/03/2018: BUN 77; Creatinine, Ser 3.56; Potassium 4.2; Sodium 144   Lipid Panel    Component Value Date/Time   CHOL 105 11/12/2017 1012   CHOL 114 04/02/2016  8242   TRIG 45 11/12/2017 1012   HDL 53 11/12/2017 1012   HDL 61 04/02/2016 0909   CHOLHDL 2.0 11/12/2017 1012   VLDL 9 11/12/2017 1012   LDLCALC 43 11/12/2017 1012   LDLCALC 43 04/02/2016 0909    Additional studies/ records that were reviewed today include:    01/29/18 MITRAL VALVE REPAIR  Conclusion  Successful mitral valve repair with deployment of 2 percutaneous  MitraClip devices under fluoroscopic and TEE guidance  MR Reduction 4+ ---> trace  Both Clips placed A2/P2    _________________   2D ECHO 01/20/18 Study Conclusions - Left ventricle: The cavity size was normal. Wall thickness was   normal. Systolic function was normal. The estimated ejection   fraction was in the range of 60% to 65%. Wall motion was normal;   there were no regional wall motion abnormalities. - Ventricular septum: Septal motion showed abnormal function and   dyssynergy. The contour showed diastolic flattening consistent   with right ventricular pressure/volume overload. - Aortic valve: Trileaflet; mildly thickened, mildly calcified   leaflets. There was mild stenosis. There was trivial   regurgitation. Peak velocity (S): 262 cm/s. Mean gradient (S): 13   mm Hg. Valve area (VTI): 1.78 cm^2. Valve area (Vmax): 1.68 cm^2.   Valve area (Vmean): 1.73 cm^2. - Mitral valve: Post mitra clip (2) Mobility was restricted. There   was trace to mild regurgitation. Mean gradient (D): 5 mm Hg.   Valve area by pressure half-time: 1.62 cm^2. Valve area by   continuity equation (using LVOT flow): 1.51 cm^2. - Left atrium: The atrium was severely dilated. - Right atrium: The atrium was severely dilated. - Tricuspid valve: There was severe regurgitation. - Pulmonary arteries: Systolic pressure was moderately increased.   PA peak pressure: 60 mm Hg (S). Impressions: - When compared to prior, mitral regurgitation is markedly   improved.  _________________   2D ECHO 03/12/18 ( 1 month s/p MitraClip) Study Conclusions - Left ventricle: The cavity size was normal. Systolic function was normal. The estimated ejection fraction was in the range of 60% to 65%. Wall motion was normal; there were no regional wall motion abnormalities. Doppler parameters are consistent with a reversible restrictive pattern, indicative of decreased left ventricular diastolic compliance and/or  increased left atrial pressure (grade 3 diastolic dysfunction). - Ventricular septum: Septal motion showed abnormal function and dyssynergy. The contour showed diastolic flattening. These changes are consistent with RV volume and pressure overload. - Aortic valve: There was mild stenosis. There was mild regurgitation. - Mitral valve: There was mild to moderate regurgitation. Mean gradient (D): 6 mm Hg. Peak gradient (D): 13 mm Hg. Valve area by pressure half-time: 2.1 cm^2. - Left atrium: The atrium was severely dilated. - Right ventricle: The cavity size was severely dilated. Wall thickness was normal. - Right atrium: The atrium was severely dilated. - Atrial septum: A septal defect cannot be excluded. 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 - Tricuspid valve: There was severe regurgitation. Peak RV-RA gradient (S): 59 mm Hg. - Pulmonic valve: There was mild regurgitation. Regurgitant gradient (ED): 8 mm Hg. - Pulmonary arteries: Systolic pressure was severely increased. PA peak pressure: 74 mm Hg (S). - Inferior vena cava: The vessel was severely dilated. The respirophasic diameter changes were blunted (< 50%), consistent with elevated central venous pressures Impressions - Status post Mitraclip. Severe biatrial enlargement, severe dilation of IVC and elevation of right sided filling pressures. Normal LVEF, septal flattening  consistent with RV pressure and volume overload.  ASSESSMENT & PLAN:   Severe MR s/p MitraClip: echo today shows EF 60%, mild to moderate regurgitation: mean gradient (D): 6 mm Hg; peak gradient (D): 13 mm Hg; valve area by pressure half-time: 2.1 cm^2.. She has NHYA II symptoms.  SBE prophylaxis discussed. She has not been getting Aspirin 81 mg daily at her facility. I have asked for this to be resumed.   Chronic diastolic CHF: she appears euvolemic. Continue close  follow up with advanced CHF  CKD: baseline creat ~3.46. She has been referred to nephrology   HTN: BP well controlled.   Medication Adjustments/Labs and Tests Ordered: Current medicines are reviewed at length with the patient today.  Concerns regarding medicines are outlined above.  Medication changes, Labs and Tests ordered today are listed in the Patient Instructions below. Patient Instructions  Medication Instructions:   Resume aspirin 81 mg by mouth daily.   Labwork: none  Testing/Procedures: none  Follow-Up: Follow up in CHF clinic as planned  You will see K. Grandville Silos, PA back in a year. An echocardiogram will be done that day.  We will call you to schedule this appointment  Any Other Special Instructions Will Be Listed Below (If Applicable).     If you need a refill on your cardiac medications before your next appointment, please call your pharmacy.      Signed, Angelena Form, PA-C  03/13/2018 10:10 AM    [facility] [address], [place], [place]  [address] Phone: [phone]; Fax: [phone]

## 2018-03-12 NOTE — Patient Instructions (Signed)
Medication Instructions:   Resume aspirin 81 mg by mouth daily.   Labwork: none  Testing/Procedures: none  Follow-Up: Follow up in CHF clinic as planned  You will see K. Grandville Silos, PA back in a year. An echocardiogram will be done that day.  We will call you to schedule this appointment  Any Other Special Instructions Will Be Listed Below (If Applicable).     If you need a refill on your cardiac medications before your next appointment, please call your pharmacy.

## 2018-03-14 NOTE — Progress Notes (Signed)
Advanced Heart Failure Clinic Note  Patient ID: April Mueller, female   DOB: 12/29/1945, 72 y.o.   MRN: [id number]   HPI  PCP: Dr Harrington Challenger Pulmonary: Dr Byrum/Dr Melvyn Novas  Cardiology: Dr. Aundra Dubin  April Mueller is a 72 y.o. with COPD on home oxygen, rheumatoid arthritis, chronic atrial fibrillation, chronic diastolic CHF, CKD.    Moved back from [place] and saw Dr. Lamonte Sakai. In [place] was followed by cardiology and pulmonary. Started on Tracleer in 2006. Revatio was added, however was discontinued without any change in her symptoms. Stopped smoking in January 2005 when she had aortic aneurysm repair. Smoked 1.5-2 ppd x 15 - 20 years.  Later on macitentan.  I had her do a TEE in 6/16.  She had a loud MR murmur.  TEE showed very eccentric, posteriorly-directed MR that may be from subtle prolapse of the anterior mitral leaflet.  I am concerned that the MR could be severe but very difficult to fully visualize.  She additionally had PFTs done that were abnormal, but according to her pulmonologist Melvyn Novas), the obstruction was really only minimal and the restriction was primarily related to body habitus.  He does not think that we can explain her hypoxemia and dyspnea primarily from parenchymal lung pathology.  I sent her to [facility] for evaluation for percutaneous mitral valve clip.  They did not think that she was a good candidate.   She has been anemic and received 1 unit PRBCs in 8/16.  She saw GI and was noted to be heme negative, no scopes were done (heme negative and high risk). She has had iron infusions.   Opsumit was stopped in 9/16 due to suspicion for group 2 pulmonary hypertension only (low PVR) and she felt better off it.   She was admitted in 1/17 with increased dyspnea and was found to be profoundly anemic.  She had a total of 5 units PRBCs and an iron infusion in the hospital.  Stool was FOBT+, melena.  EGD, colonoscopy, and capsule endoscopy were all done without clear source of blood loss.  While in the  hospital, she was noted to go into atrial fibrillation with RVR.  She initially required amiodarone for rate control, but was transitioned over to Toprol XL. She was not anticoagulated due to concern for GI blood loss and anemia.  Anticoagulation was attempted later.   RHC in 3/17 showed elevated right heart filling pressures and mildly elevated PCWP.  There was mild pulmonary hypertension, likely pulmonary venous hypertension with PVR 1.9 WU.   She was admitted in 7/17 with symptomatic anemia, received 3 units PRBCs.  Anticoagulation was stopped.  Small bowel enteroscopy did not show a source of bleeding.    She was seen by Dr Rayann Heman, he recommended against Watchman. She is now off anticoagulation.  Has had Fe infusions and getting Aranesp.   Admitted 12/7 through 07/23/16. Treated for UTI, sepsis and volume overload. Discharged to [facility] SNF then later home.   Recently seen with increased dyspnea.  Repeat echo showed severe MR so TEE was done.  TEE in 4/19 showed severe MR with partial flail A2 scallop and moderate-severe TR.   In 6/19, she was admitted for successful Mitraclip placement.  Required short term intubation. She was treated for CHF in the hospital for several days after this, then discharged to SNF.   Today she returns for HF follow up. Last visit torsemide was increased for volume overload and she was referred to nephrology. She was discharged from [facility]  health rehab Friday because her insurance would no longer pay for it. Overall not feeling well today. She is disappointed that she is not seeing Dr Aundra Dubin today. She is concerned about a chest cold and is also not sure what medications she is supposed to take since discharge from rehab. She started having a cough with productive white cough on Friday. No fever or chills. She has worsening BLE edema. She is orthopneic. Less UOP. She has been taking torsemide 80 mg am, 40 mg pm. Unsure if she was getting metolazone at SNF. Denies  CP or dizziness. Wearing CPAP qHS. Currently on 6 L [place] with O2 sats 87%. Has not been weighing since she's been at home. Home health is supposed to start this week.   Labs 02/05/13 K 3.8 Creatinine 1.37 05/08/13 K 3.8, Creatinine 1.58 11/14 K 4.1, creatinine 1.82 08/09/2014: K 4.0 Creatinine 1.45  5/16: K 3.7, creatinine 1.76, BNP 447 6/16: K 5 => 4.6, creatinine 2.09 => 2.13, BNP 291 8/16: K 4.7, creatinine 2.1, HCT 30.7 9/16: K 4.5, creatinine 2.3, HCT 34.1 1/17: K 4.8, creatinine 1.84 => 1.87, HCT 29.7, plts 136, FOBT+, BNP 403 2/17: K 3.5, creatinine 1.89, BNP 329, hgb 10 3/17: K 3.8, creatinine 2.5 => 2.3, HCT 35.4 => 33.5 5/17: K 3.7, creatinine 2.0, hgb 10.6 7/17: K 3.7, creatinine 2, hgb 9.9 8/17: K 3.8, creatinine 1.9, HCT 31.3 9/17: K 3.9, creatinine 2 07/23/2016: K 4.3 Creatinine 2.76  08/02/2016: K 4.7 Creatinine 2.0  4/18: K 4, creatinine 2.3, hgb 10.7 8/18: K 4, creatinine 2.13, hgb 10.4, plts 83 9/18: LDL 59 11/18: K 4, creatinine 2.2 12/18: hgb 10.7 4/19: K 4.2, creatinine 2.6 6/19: K 3.5, creatnine 3.69 7/19: K 4.4, creatinine 2.79 02/18/2018 K 4.8 Creatinine 3.43  1) COPD: Former smoker (28 pk-yrs).  On home O2 - 4L/min rest, up to 5L/min exertion.  - spirometry 6/13 in [place] = 0.85L FVC = 1.3L no DLCO measured. FEV1 in 2014 = 0.73  - CT chest 10/14 - moderate centrilobular emphysema. No pulm fibrosis but there was evidence for post-infectious/inflammatory scarring. Stable Asc Ao repair - PFTs (11/14) FVC 46%, FEV1 0.8L (40%), ratio 87%, FEF 25-75% 0.44L DLCO 22%, TLC 50% => PFTs in 06/2013 did NOT suggest significant airflow obstruction according to Dr Gustavus Bryant last note despite "moderate emphysema" on CT.  - PFTs (6/16) were abnormal with severe obstruction, severe restriction, severely decreased DLCO.  Reviewed with Dr Melvyn Novas => he thinks obstruction is actually fairly minimal and that the restriction is due to body habitus.   2) Obesity 3) RA: On plaquenil, sees Dr  Lenna Gilford.  4) Type A aortic dissection s/p repair 2005: c/b renal failure requiring HD x 3 months 5) PAH - previously treated in [place].  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  - V/Q scan 11/14 no chronic PE - 2015 Tracleer stopped and started on macitentan. 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  - RHC 08/18/2014: No role for pulmonary vasodilators.  RA = 14 RV = 62/3/15 PA = 63/21 (38) PCW = mean 23 v waves to 40 Fick cardiac output/index = 6.3/3.2 Thermodilution CO/CI = 6.1/3.1, PVR = 2.0 WU, FA sat = 91% PA sat = 57%, 62% - RHC 3/17: mean RA 13, PA 44/2 mean 29, mean PCWP 17, CI 3.04, PVR 1.9 WU => pulmonary venous hypertension. 6) OSA 7) Diastolic CHF - ECHO 1/85 EF 55-60% RV mildly dilated with normal function. Mild to moderate posterior MR.  RVSP 56. Probable restrictive diastolic filling pattern - ECHO 9/15 EF 60-65% RV mildly dilated with normal function. Mild to moderate posterior MR. RVSP 40. Ao Root ok - TEE 6/16 with EF 55-60%, D-shaped interventricular septum suggestive of RV pressure/volume overload, RV mildly dilated with normal systolic function, peak RV-RA gradient 57 mmHg, very eccentric posteriorly-directed mitral regurgitation, possibly severe, etiology may be a very small area of prolapse on the anterior leaflet, s/p ascending aorta repair with residual dissection flap in the descending thoracic aorta.   - Echo (1/17) with EF 55-60%, mild to moderate MR, PASP 66 - Echo (12/17) with EF 55-60%, grade II diastolic dysfunction, mild MR, mildly decreased RV systolic function, PASP 66 mmHg.  8) CKD 9) Mitral regurgitation: Possibly severe by 6/16 TEE, very eccentric.  Not good candidate for percutaneous MV clip (seen at [facility]).  Echo (1/17) with only mild to moderate MR.  - TEE (4/19): EF 55-60%, severe MR with partial flail A2 scallop, mildly decreased RV systolic function, moderate-severe TR, peak RV-RA gradient 43 mmHg.  - Mitraclip placement 6/19.  - Echo  (6/19, post-Mitraclip): EF 60-65%, mild AS, IV septal flattening, s/p Mitraclip with mean gradient 5 mmHg and MVA 1.6 cm^2 by PHT, trivial-mild MR, severe TR, PASP 60 mmHg.  10) Anemia: Suspect from chronic disease/renal disease and well as chronic GI bleeding.  EGD/colonoscopy/capsule endoscopy in 1/17 did not show any definite site for blood loss.  Small bowel enteroscopy in 7/17 did not show etiology of bleeding. 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  Getting Fe infusions.  11) Atrial fibrillation: Persistent, 1st noted in 1/17.  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   Review of systems complete and found to be negative unless listed in HPI.   Current Outpatient Medications on File Prior to Encounter  Medication Sig Dispense Refill  . acetaminophen (TYLENOL) 500 MG tablet Take 1,000 mg by mouth every 6 (six) hours as needed for moderate pain or headache.    . allopurinol (ZYLOPRIM) 300 MG tablet Take 300 mg by mouth daily.    [name] ALPRAZolam (XANAX) 0.25 MG tablet Take 1 tablet (0.25 mg total) by mouth every 12 (twelve) hours as needed for anxiety or sleep. (Patient taking differently: Take 0.25 mg by mouth at bedtime. ) 60 tablet 5  . aspirin 81 MG chewable tablet Chew 1 tablet (81 mg total) by mouth daily. 30 tablet 6  . atorvastatin (LIPITOR) 10 MG tablet Take 1 tablet (10 mg total) by mouth daily. 90 tablet 2  . Biotin 5000 MCG CAPS Take 5,000 mcg by mouth daily.     [name] gabapentin (NEURONTIN) 100 MG capsule Take 100 mg by mouth daily.    . hydroxychloroquine (PLAQUENIL) 200 MG tablet Take 200 mg by mouth 2 (two) times daily.    . metolazone (ZAROXOLYN) 2.5 MG tablet Take 2.5 mg by mouth 3 (three) times a week. Tues,Thur, Sat    . midodrine (PROAMATINE) 5 MG tablet Take 5 mg by mouth 2 (two) times daily with a meal.    . Multiple Vitamin (MULTIVITAMIN WITH MINERALS) TABS tablet Take 1 tablet by mouth daily.    . Omega-3 Fatty Acids (FISH OIL) 1200 MG CAPS Take 1,200  mg by mouth daily.    . OXYGEN Inhale 3 L into the lungs continuous.     . Polyethylene Glycol 400 (BLINK TEARS OP) Place 1-2 drops into both eyes at bedtime.    . potassium chloride SA (K-DUR,KLOR-CON) 20 MEQ tablet Take 2 tablets (40 mEq total)  by mouth daily. 60 tablet 6  . Tiotropium Bromide-Olodaterol (STIOLTO RESPIMAT) 2.5-2.5 MCG/ACT AERS Inhale into the lungs.    . torsemide (DEMADEX) 20 MG tablet Take 3 tablets (60 mg total) by mouth 2 (two) times daily. 180 tablet 11   No current facility-administered medications on file prior to encounter.    Social History   Socioeconomic History  . Marital status: Legally Separated    Spouse name: Not on file  . Number of children: 1  . Years of education: Not on file  . Highest education level: Not on file  Occupational History  . Occupation: Disability    Comment: Office Work  Scientific laboratory technician  . Financial resource strain: Not on file  . Food insecurity:    Worry: Not on file    Inability: Not on file  . Transportation needs:    Medical: Not on file    Non-medical: Not on file  Tobacco Use  . Smoking status: Former Smoker    Packs/day: 1.50    Years: 14.00    Pack years: 21.00    Types: Cigarettes    Last attempt to quit: 08/09/2003    Years since quitting: 14.6  . Smokeless tobacco: Never Used  Substance and Sexual Activity  . Alcohol use: Yes    Alcohol/week: 0.0 standard drinks    Comment: 02/20/2016 "drank a little in my teens"  . Drug use: No  . Sexual activity: Never  Lifestyle  . Physical activity:    Days per week: Not on file    Minutes per session: Not on file  . Stress: Not on file  Relationships  . Social connections:    Talks on phone: Not on file    Gets together: Not on file    Attends religious service: Not on file    Active member of club or organization: Not on file    Attends meetings of clubs or organizations: Not on file    Relationship status: Not on file  Other Topics Concern  . Not on file    Social History Narrative  . Not on file   Family History  Problem Relation Age of Onset  . Heart attack Mother   . Prostate cancer Father   . Diabetes Brother   . Kidney failure Brother   . Breast cancer Maternal Aunt   . Colon cancer Neg Hx   . Stomach cancer Neg Hx     Vitals:   03/17/18 0935  BP: 100/60  Pulse: 60  SpO2: (!) 87%  Weight: 78.9 kg (174 lb)   Wt Readings from Last 3 Encounters:  03/17/18 78.9 kg (174 lb)  03/12/18 79.1 kg (174 lb 6.4 oz)  03/03/18 78 kg (172 lb)   General: Arrived in wheelchair. No resp difficulty. HEENT: Normal Neck: Supple. JVP ~10. Carotids 2+ bilat; no bruits. No thyromegaly or nodule noted. Cor: PMI nondisplaced. IRR, 3/6 SEM RUSB Lungs: clear, diminished. On 6L O2 Abdomen: Soft, non-tender, non-distended, no HSM. No bruits or masses. +BS  Extremities: No cyanosis, clubbing, or rash. R and LLE 1-2+ edema Neuro: Alert & orientedx3, cranial nerves grossly intact. moves all 4 extremities w/o difficulty. Affect pleasant   Assessment/Plan: 1. Chronic diastolic CHF with pulmonary hypertension/RV failure:   - NYHA IIIb.  - Volume status elevated. Continue torsemide 60 mg twice a day. Confirmed with [employer] that she has not been getting metolazone. Restart metolazone 2x/week. Discussed with Dr Aundra Dubin. BMET today and in 1 week.  - She  is currently taking potassium 40 meq BID (we have 40 meq daily on our list). Check BMET today and in 1 week.  - No spiro with CKD IV - Continue low salt diet and limit fluid intake to < 2 liters per day.   - Continue midodrine 5 mg BID.  - Instructed her to call us if symptoms do not improve.  - [facility] to start [facility] RN and PT this week.   2. Chronic respiratory failure:   She has COPD and restriction from her body habitus (OHS). Continue 3 liters oxygen continuously. Currently on 6 L with O2 87%. Increase diuretics as above. Discussed with Dr Aundra Dubin 3. OSA: Continue CPAP nightly.  4. CKD: Stage IV.   - Creatinine 7/29 3.56 - Creatinine baseline 2.8-3 - Referred to nephrology last visit. Has an appointment 8/23 at [place]. Encouraged her to keep this appointment.  - BMET today and in 1 week.  5. Pulmonary hypertension: This is primarily Dickens group 2 (pulmonary venous hypertension) based on RHC in 3/17.  She is not a good candidate for pulmonary vasodilators.  No change 6. Mitral regurgitation:  Severe MR on 4/19 TEE due to partial flail of the anterior leaflet (A2 scallop). Now s/p Mitraclip in 6/19.  Post-procedure echo in 6/19 showed mild April, trivial-mild MR.   - Continue ASA 81 post-Mitraclip. Saw April Range PA last week.  7. Anemia: Suspect anemia of chronic disease/renal disease but also with component of GI blood loss.  She has had GI bleeding with FOBT+, but unable to visualize bleeding source on EGD, colonoscopy, enteroscopy, or capsule endoscopy (most recently in 7/17).  Seeing hematology (Dr. Irene Limbo), getting Aranesp and Fe infusions with Fe deficiency and possible MDS.  8. Atrial fibrillation: Persistent.  Rate well controlled.  CHADSVASC = 3.  She was evaluated for Watchman but decided not to be a good candidate. Due to recurrent severe GI bleeding, anticoagulation was stopped altogether. No change.  - Continue 81 ASA. No stroke-like symptoms.  9. 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 No change  Restart metolazone 2.5 mg 2x/week on Mon and Thurs Follow up 1 week with BMET. She is aware that she will have to see an APP. [facility] to start [facility] PT/RN this week Keep nephrology appointment 8/23 Set up follow up with Dr Aundra Dubin next available. She would rather follow up with him than on APP side.   Georgiana Shore, NP 03/17/2018  Greater than 50% of the 25 minute visit was spent in counseling/coordination of care regarding disease state education, salt/fluid restriction, sliding scale diuretics, and medication compliance.

## 2018-03-17 ENCOUNTER — Ambulatory Visit (HOSPITAL_COMMUNITY)
Admission: RE | Admit: 2018-03-17 | Discharge: 2018-03-17 | Disposition: A | Discharge status: Home / Self Care | Payer: Medicare Other | Source: Ambulatory Visit | Attending: Cardiology | Admitting: Cardiology

## 2018-03-17 ENCOUNTER — Encounter (HOSPITAL_COMMUNITY): Payer: Self-pay

## 2018-03-17 VITALS — BP 100/60 | HR 60 | Wt 174.0 lb

## 2018-03-17 DIAGNOSIS — I272 Pulmonary hypertension, unspecified: Secondary | ICD-10-CM | POA: Insufficient documentation

## 2018-03-17 DIAGNOSIS — D649 Anemia, unspecified: Secondary | ICD-10-CM | POA: Diagnosis not present

## 2018-03-17 DIAGNOSIS — I480 Paroxysmal atrial fibrillation: Secondary | ICD-10-CM | POA: Diagnosis not present

## 2018-03-17 DIAGNOSIS — I5032 Chronic diastolic (congestive) heart failure: Secondary | ICD-10-CM | POA: Insufficient documentation

## 2018-03-17 DIAGNOSIS — M069 Rheumatoid arthritis, unspecified: Secondary | ICD-10-CM | POA: Diagnosis not present

## 2018-03-17 DIAGNOSIS — Z95818 Presence of other cardiac implants and grafts: Secondary | ICD-10-CM | POA: Diagnosis not present

## 2018-03-17 DIAGNOSIS — J449 Chronic obstructive pulmonary disease, unspecified: Secondary | ICD-10-CM | POA: Insufficient documentation

## 2018-03-17 DIAGNOSIS — I482 Chronic atrial fibrillation: Secondary | ICD-10-CM | POA: Diagnosis not present

## 2018-03-17 DIAGNOSIS — Z87891 Personal history of nicotine dependence: Secondary | ICD-10-CM | POA: Diagnosis not present

## 2018-03-17 DIAGNOSIS — Z9981 Dependence on supplemental oxygen: Secondary | ICD-10-CM | POA: Insufficient documentation

## 2018-03-17 DIAGNOSIS — N184 Chronic kidney disease, stage 4 (severe): Secondary | ICD-10-CM | POA: Insufficient documentation

## 2018-03-17 DIAGNOSIS — G4733 Obstructive sleep apnea (adult) (pediatric): Secondary | ICD-10-CM | POA: Diagnosis not present

## 2018-03-17 DIAGNOSIS — Z9989 Dependence on other enabling machines and devices: Secondary | ICD-10-CM

## 2018-03-17 DIAGNOSIS — I130 Hypertensive heart and chronic kidney disease with heart failure and stage 1 through stage 4 chronic kidney disease, or unspecified chronic kidney disease: Secondary | ICD-10-CM | POA: Insufficient documentation

## 2018-03-17 DIAGNOSIS — Z9889 Other specified postprocedural states: Secondary | ICD-10-CM | POA: Diagnosis not present

## 2018-03-17 DIAGNOSIS — Z79899 Other long term (current) drug therapy: Secondary | ICD-10-CM | POA: Diagnosis not present

## 2018-03-17 DIAGNOSIS — I340 Nonrheumatic mitral (valve) insufficiency: Secondary | ICD-10-CM | POA: Diagnosis not present

## 2018-03-17 DIAGNOSIS — D696 Thrombocytopenia, unspecified: Secondary | ICD-10-CM | POA: Diagnosis not present

## 2018-03-17 DIAGNOSIS — J9610 Chronic respiratory failure, unspecified whether with hypoxia or hypercapnia: Secondary | ICD-10-CM | POA: Insufficient documentation

## 2018-03-17 DIAGNOSIS — Z7982 Long term (current) use of aspirin: Secondary | ICD-10-CM | POA: Diagnosis not present

## 2018-03-17 LAB — BASIC METABOLIC PANEL
Anion gap: 10 (ref 5–15)
BUN: 82 mg/dL — ABNORMAL HIGH (ref 8–23)
CALCIUM: 10.2 mg/dL (ref 8.9–10.3)
CO2: 28 mmol/L (ref 22–32)
Chloride: 106 mmol/L (ref 98–111)
Creatinine, Ser: 3.43 mg/dL — ABNORMAL HIGH (ref 0.44–1.00)
GFR calc Af Amer: 15 mL/min — ABNORMAL LOW (ref >60)
GFR, EST NON AFRICAN AMERICAN: 13 mL/min — AB (ref >60)
GLUCOSE: 102 mg/dL — AB (ref 70–99)
Potassium: 4.4 mmol/L (ref 3.5–5.1)
Sodium: 144 mmol/L (ref 135–145)

## 2018-03-17 MED ORDER — MIDODRINE HCL 5 MG PO TABS: 5.0000 mg | ORAL_TABLET | Freq: Two times a day (BID) | ORAL | 6 refills | Status: DC

## 2018-03-17 MED ORDER — METOLAZONE 2.5 MG PO TABS: 2.5000 mg | ORAL_TABLET | ORAL | 3 refills | Status: DC

## 2018-03-17 MED ORDER — POTASSIUM CHLORIDE CRYS ER 20 MEQ PO TBCR: 40.0000 meq | EXTENDED_RELEASE_TABLET | Freq: Two times a day (BID) | ORAL | 6 refills | Status: AC

## 2018-03-17 NOTE — Progress Notes (Signed)
[employer] [phone]) and spoke w/Angela, RN regarding pt's dosing of Torsemide and Metolazone while she was in the facility.  She states per chart pt was taking Torsemide 60 mg BID and was not on Metolazone at all.  Information given to Lillia Mountain, NP

## 2018-03-17 NOTE — Patient Instructions (Signed)
Continue Torsemide 60 mg (3 tabs) Twice daily  Continue Potassium 40 meq (2 tabs) Twice daily   Start Metolazone 2.5 mg twice a week, TAKE EVERY Monday AND Thursday   Labs today  Your physician recommends that you schedule a follow-up appointment in: 1 week with Darrick Grinder, NP  Your physician recommends that you schedule a follow-up appointment in: 6-8 weeks with Dr Aundra Dubin

## 2018-03-18 ENCOUNTER — Telehealth (HOSPITAL_COMMUNITY): Payer: Self-pay | Admitting: *Deleted

## 2018-03-18 NOTE — Telephone Encounter (Signed)
[facility] received orders from [facility] when pt' was d/c'd, they want to verify Dr Aundra Dubin would sign orders, gave VO that he would, she states they will see pt tomorrow

## 2018-03-20 ENCOUNTER — Encounter: Payer: Self-pay | Admitting: *Deleted

## 2018-03-25 ENCOUNTER — Ambulatory Visit (HOSPITAL_COMMUNITY)
Admission: RE | Admit: 2018-03-25 | Discharge: 2018-03-25 | Disposition: A | Discharge status: Home / Self Care | Payer: Medicare Other | Source: Ambulatory Visit | Attending: Internal Medicine | Admitting: Internal Medicine

## 2018-03-25 ENCOUNTER — Encounter (HOSPITAL_COMMUNITY): Payer: Self-pay

## 2018-03-25 VITALS — BP 95/54 | HR 58 | Wt 171.4 lb

## 2018-03-25 DIAGNOSIS — I272 Pulmonary hypertension, unspecified: Secondary | ICD-10-CM | POA: Insufficient documentation

## 2018-03-25 DIAGNOSIS — I480 Paroxysmal atrial fibrillation: Secondary | ICD-10-CM

## 2018-03-25 DIAGNOSIS — J9610 Chronic respiratory failure, unspecified whether with hypoxia or hypercapnia: Secondary | ICD-10-CM | POA: Diagnosis not present

## 2018-03-25 DIAGNOSIS — R001 Bradycardia, unspecified: Secondary | ICD-10-CM

## 2018-03-25 DIAGNOSIS — J9611 Chronic respiratory failure with hypoxia: Secondary | ICD-10-CM | POA: Diagnosis not present

## 2018-03-25 DIAGNOSIS — D649 Anemia, unspecified: Secondary | ICD-10-CM | POA: Insufficient documentation

## 2018-03-25 DIAGNOSIS — J449 Chronic obstructive pulmonary disease, unspecified: Secondary | ICD-10-CM | POA: Diagnosis not present

## 2018-03-25 DIAGNOSIS — I5032 Chronic diastolic (congestive) heart failure: Secondary | ICD-10-CM | POA: Insufficient documentation

## 2018-03-25 DIAGNOSIS — Z8249 Family history of ischemic heart disease and other diseases of the circulatory system: Secondary | ICD-10-CM | POA: Insufficient documentation

## 2018-03-25 DIAGNOSIS — Z841 Family history of disorders of kidney and ureter: Secondary | ICD-10-CM | POA: Diagnosis not present

## 2018-03-25 DIAGNOSIS — D696 Thrombocytopenia, unspecified: Secondary | ICD-10-CM | POA: Diagnosis not present

## 2018-03-25 DIAGNOSIS — M069 Rheumatoid arthritis, unspecified: Secondary | ICD-10-CM | POA: Diagnosis not present

## 2018-03-25 DIAGNOSIS — I340 Nonrheumatic mitral (valve) insufficiency: Secondary | ICD-10-CM | POA: Diagnosis not present

## 2018-03-25 DIAGNOSIS — Z9981 Dependence on supplemental oxygen: Secondary | ICD-10-CM | POA: Diagnosis not present

## 2018-03-25 DIAGNOSIS — G4733 Obstructive sleep apnea (adult) (pediatric): Secondary | ICD-10-CM | POA: Diagnosis not present

## 2018-03-25 DIAGNOSIS — I481 Persistent atrial fibrillation: Secondary | ICD-10-CM | POA: Diagnosis not present

## 2018-03-25 DIAGNOSIS — Z95818 Presence of other cardiac implants and grafts: Secondary | ICD-10-CM | POA: Diagnosis not present

## 2018-03-25 DIAGNOSIS — Z7982 Long term (current) use of aspirin: Secondary | ICD-10-CM | POA: Insufficient documentation

## 2018-03-25 DIAGNOSIS — Z87891 Personal history of nicotine dependence: Secondary | ICD-10-CM | POA: Insufficient documentation

## 2018-03-25 DIAGNOSIS — Z79899 Other long term (current) drug therapy: Secondary | ICD-10-CM | POA: Diagnosis not present

## 2018-03-25 DIAGNOSIS — N184 Chronic kidney disease, stage 4 (severe): Secondary | ICD-10-CM | POA: Diagnosis not present

## 2018-03-25 DIAGNOSIS — Z833 Family history of diabetes mellitus: Secondary | ICD-10-CM | POA: Diagnosis not present

## 2018-03-25 DIAGNOSIS — Z87440 Personal history of urinary (tract) infections: Secondary | ICD-10-CM | POA: Diagnosis not present

## 2018-03-25 LAB — BASIC METABOLIC PANEL
Anion gap: 10 (ref 5–15)
BUN: 96 mg/dL — AB (ref 8–23)
CALCIUM: 10 mg/dL (ref 8.9–10.3)
CO2: 29 mmol/L (ref 22–32)
CREATININE: 4.03 mg/dL — AB (ref 0.44–1.00)
Chloride: 100 mmol/L (ref 98–111)
GFR calc non Af Amer: 10 mL/min — ABNORMAL LOW (ref >60)
GFR, EST AFRICAN AMERICAN: 12 mL/min — AB (ref >60)
GLUCOSE: 96 mg/dL (ref 70–99)
Potassium: 4.1 mmol/L (ref 3.5–5.1)
Sodium: 139 mmol/L (ref 135–145)

## 2018-03-25 LAB — BRAIN NATRIURETIC PEPTIDE: B Natriuretic Peptide: 529.3 pg/mL — ABNORMAL HIGH (ref 0.0–100.0)

## 2018-03-25 MED ORDER — METOPROLOL SUCCINATE ER 25 MG PO TB24: 25.0000 mg | ORAL_TABLET | Freq: Every day | ORAL | 3 refills | Status: AC

## 2018-03-25 MED ORDER — TORSEMIDE 20 MG PO TABS: 80.0000 mg | ORAL_TABLET | Freq: Two times a day (BID) | ORAL | 11 refills | Status: DC

## 2018-03-25 NOTE — Progress Notes (Signed)
Advanced Heart Failure Clinic Note  Patient ID: April Mueller, female   DOB: 11/08/1945, 72 y.o.   MRN: [id number]   HPI  PCP: Dr Harrington Challenger Pulmonary: Dr Byrum/Dr Melvyn Novas  Cardiology: Dr. Aundra Dubin  April Mueller is a 72 y.o. with COPD on home oxygen, rheumatoid arthritis, chronic atrial fibrillation, PAH, chronic diastolic CHF, anemia, and CKD Stage IV.    Moved back from [place] and saw Dr. Lamonte Sakai. In [place] was followed by cardiology and pulmonary. Started on Tracleer in 2006. Revatio was added, however was discontinued without any change in her symptoms. Stopped smoking in January 2005 when she had aortic aneurysm repair. Smoked 1.5-2 ppd x 15 - 20 years.  Later on macitentan.  She underwent TEE in 6/16.  She had a loud MR murmur.  TEE showed very eccentric, posteriorly-directed MR that may be from subtle prolapse of the anterior mitral leaflet.  There was concern that the MR could be severe but very difficult to fully visualize.  She additionally had PFTs done that were abnormal, but according to her pulmonologist Melvyn Novas), the obstruction was really only minimal and the restriction was primarily related to body habitus.  He does not think that we can explain her hypoxemia and dyspnea primarily from parenchymal lung pathology.  She was sent to [facility] for evaluation for percutaneous mitral valve clip.  They did not think that she was a good candidate.   She has been anemic and received 1 unit PRBCs in 8/16.  She saw GI and was noted to be heme negative, no scopes were done (heme negative and high risk). She has had iron infusions.   Opsumit was stopped in 9/16 due to suspicion for group 2 pulmonary hypertension only (low PVR) and she felt better off it.   She was admitted in 1/17 with increased dyspnea and was found to be profoundly anemic.  She had a total of 5 units PRBCs and an iron infusion in the hospital.  Stool was FOBT+, melena.  EGD, colonoscopy, and capsule endoscopy were all done without clear source of  blood loss.  While in the hospital, she was noted to go into atrial fibrillation with RVR.  She initially required amiodarone for rate control, but was transitioned over to Toprol XL. She was not anticoagulated due to concern for GI blood loss and anemia.  Anticoagulation was attempted later.   RHC in 3/17 showed elevated right heart filling pressures and mildly elevated PCWP.  There was mild pulmonary hypertension, likely pulmonary venous hypertension with PVR 1.9 WU.   She was admitted in 7/17 with symptomatic anemia, received 3 units PRBCs.  Anticoagulation was stopped.  Small bowel enteroscopy did not show a source of bleeding.    She was seen by Dr Rayann Heman, he recommended against Watchman. She is now off anticoagulation.  Has had Fe infusions and getting Aranesp.   Admitted 12/7 through 07/23/16. Treated for UTI, sepsis and volume overload. Discharged to [facility] then later home.   Recently seen with increased dyspnea.  Repeat echo showed severe MR so TEE was done.  TEE in 4/19 showed severe MR with partial flail A2 scallop and moderate-severe TR.   In 6/19, she was admitted for successful Mitraclip placement.  Required short term intubation. She was treated for CHF in the hospital for several days after this, then discharged to SNF.   Today she returns for HF follow up. Last visit metolazone was restarted. Overall feeling fair. Ongoing fatigue. Says it takes a long time for  her to do things around her house. SOB with exertion. Denies PND/Orthopnea. Uses 3 liters oxygen at home and 6 liters with exertion. Appetite fair. No fever or chills. Weight at home has been 169-173 pounds.   Taking all medications. Says she was having palpitations so restarted Toprol xl 25 mg twice a day. No bleeding issues. Followed by [facility]. Plans to start PT next week. Lives alone. Has a hard time completing work around the house.    Labs 02/05/13 K 3.8 Creatinine 1.37 05/08/13 K 3.8, Creatinine 1.58 11/14 K  4.1, creatinine 1.82 08/09/2014: K 4.0 Creatinine 1.45  5/16: K 3.7, creatinine 1.76, BNP 447 6/16: K 5 => 4.6, creatinine 2.09 => 2.13, BNP 291 8/16: K 4.7, creatinine 2.1, HCT 30.7 9/16: K 4.5, creatinine 2.3, HCT 34.1 1/17: K 4.8, creatinine 1.84 => 1.87, HCT 29.7, plts 136, FOBT+, BNP 403 2/17: K 3.5, creatinine 1.89, BNP 329, hgb 10 3/17: K 3.8, creatinine 2.5 => 2.3, HCT 35.4 => 33.5 5/17: K 3.7, creatinine 2.0, hgb 10.6 7/17: K 3.7, creatinine 2, hgb 9.9 8/17: K 3.8, creatinine 1.9, HCT 31.3 9/17: K 3.9, creatinine 2 07/23/2016: K 4.3 Creatinine 2.76  08/02/2016: K 4.7 Creatinine 2.0  4/18: K 4, creatinine 2.3, hgb 10.7 8/18: K 4, creatinine 2.13, hgb 10.4, plts 83 9/18: LDL 59 11/18: K 4, creatinine 2.2 12/18: hgb 10.7 4/19: K 4.2, creatinine 2.6 6/19: K 3.5, creatnine 3.69 7/19: K 4.4, creatinine 2.79 02/18/2018 K 4.8 Creatinine 3.43 03/17/2018: K 4.4 Creatinine 3.43   1) COPD: Former smoker (28 pk-yrs).  On home O2 - 4L/min rest, up to 5L/min exertion.  - spirometry 6/13 in [place] = 0.85L FVC = 1.3L no DLCO measured. FEV1 in 2014 = 0.73  - CT chest 10/14 - moderate centrilobular emphysema. No pulm fibrosis but there was evidence for post-infectious/inflammatory scarring. Stable Asc Ao repair - PFTs (11/14) FVC 46%, FEV1 0.8L (40%), ratio 87%, FEF 25-75% 0.44L DLCO 22%, TLC 50% => PFTs in 06/2013 did NOT suggest significant airflow obstruction according to Dr Gustavus Bryant last note despite "moderate emphysema" on CT.  - PFTs (6/16) were abnormal with severe obstruction, severe restriction, severely decreased DLCO.  Reviewed with Dr Melvyn Novas => he thinks obstruction is actually fairly minimal and that the restriction is due to body habitus.   2) Obesity 3) RA: On plaquenil, sees Dr Lenna Gilford.  4) Type A aortic dissection s/p repair 2005: c/b renal failure requiring HD x 3 months 5) PAH - previously treated in [place].  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  - V/Q scan 11/14 no chronic PE - 2015 Tracleer  stopped and started on macitentan. 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  - RHC 08/18/2014: No role for pulmonary vasodilators.  RA = 14 RV = 62/3/15 PA = 63/21 (38) PCW = mean 23 v waves to 40 Fick cardiac output/index = 6.3/3.2 Thermodilution CO/CI = 6.1/3.1, PVR = 2.0 WU, FA sat = 91% PA sat = 57%, 62% - RHC 3/17: mean RA 13, PA 44/2 mean 29, mean PCWP 17, CI 3.04, PVR 1.9 WU => pulmonary venous hypertension. 6) OSA 7) Diastolic CHF - ECHO 7/02 EF 55-60% RV mildly dilated with normal function. Mild to moderate posterior MR. RVSP 56. Probable restrictive diastolic filling pattern - ECHO 9/15 EF 60-65% RV mildly dilated with normal function. Mild to moderate posterior MR. RVSP 40. Ao Root ok - TEE 6/16 with EF 55-60%, D-shaped interventricular septum suggestive of RV pressure/volume overload,  RV mildly dilated with normal systolic function, peak RV-RA gradient 57 mmHg, very eccentric posteriorly-directed mitral regurgitation, possibly severe, etiology may be a very small area of prolapse on the anterior leaflet, s/p ascending aorta repair with residual dissection flap in the descending thoracic aorta.   - Echo (1/17) with EF 55-60%, mild to moderate MR, PASP 66 - Echo (12/17) with EF 55-60%, grade II diastolic dysfunction, mild MR, mildly decreased RV systolic function, PASP 66 mmHg.  8) CKD 9) Mitral regurgitation: Possibly severe by 6/16 TEE, very eccentric.  Not good candidate for percutaneous MV clip (seen at [facility]).  Echo (1/17) with only mild to moderate MR.  - TEE (4/19): EF 55-60%, severe MR with partial flail A2 scallop, mildly decreased RV systolic function, moderate-severe TR, peak RV-RA gradient 43 mmHg.  - Mitraclip placement 6/19.  - Echo (6/19, post-Mitraclip): EF 60-65%, mild AS, IV septal flattening, s/p Mitraclip with mean gradient 5 mmHg and MVA 1.6 cm^2 by PHT, trivial-mild MR, severe TR, PASP 60 mmHg.  10) Anemia: Suspect from chronic  disease/renal disease and well as chronic GI bleeding.  EGD/colonoscopy/capsule endoscopy in 1/17 did not show any definite site for blood loss.  Small bowel enteroscopy in 7/17 did not show etiology of bleeding. 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  Getting Fe infusions.  11) Atrial fibrillation: Persistent, 1st noted in 1/17.  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   Review of systems complete and found to be negative unless listed in HPI.   Current Outpatient Medications on File Prior to Encounter  Medication Sig Dispense Refill  . acetaminophen (TYLENOL) 500 MG tablet Take 1,000 mg by mouth every 6 (six) hours as needed for moderate pain or headache.    . ALPRAZolam (XANAX) 0.25 MG tablet Take 1 tablet (0.25 mg total) by mouth every 12 (twelve) hours as needed for anxiety or sleep. (Patient taking differently: Take 0.25 mg by mouth at bedtime. ) 60 tablet 5  . atorvastatin (LIPITOR) 10 MG tablet Take 1 tablet (10 mg total) by mouth daily. 90 tablet 2  . Biotin 5000 MCG CAPS Take 5,000 mcg by mouth daily.     [name] gabapentin (NEURONTIN) 100 MG capsule Take 100 mg by mouth daily.    . hydroxychloroquine (PLAQUENIL) 200 MG tablet Take 200 mg by mouth 2 (two) times daily.    . metolazone (ZAROXOLYN) 2.5 MG tablet Take 1 tablet (2.5 mg total) by mouth 2 (two) times a week. Every Monday and Thursday 15 tablet 3  . metoprolol succinate (TOPROL-XL) 25 MG 24 hr tablet Take 25 mg by mouth 2 (two) times daily.    . midodrine (PROAMATINE) 5 MG tablet Take 1 tablet (5 mg total) by mouth 2 (two) times daily with a meal. 60 tablet 6  . potassium chloride SA (K-DUR,KLOR-CON) 20 MEQ tablet Take 2 tablets (40 mEq total) by mouth 2 (two) times daily. 120 tablet 6  . torsemide (DEMADEX) 20 MG tablet Take 3 tablets (60 mg total) by mouth 2 (two) times daily. 180 tablet 11  . allopurinol (ZYLOPRIM) 300 MG tablet Take 300 mg by mouth daily.    [name] aspirin 81 MG chewable tablet Chew 1 tablet  (81 mg total) by mouth daily. (Patient not taking: Reported on 03/25/2018) 30 tablet 6  . Multiple Vitamin (MULTIVITAMIN WITH MINERALS) TABS tablet Take 1 tablet by mouth daily.    . Omega-3 Fatty Acids (FISH OIL) 1200 MG CAPS Take 1,200 mg by mouth daily.    . OXYGEN Inhale 3  L into the lungs continuous.     . Polyethylene Glycol 400 (BLINK TEARS OP) Place 1-2 drops into both eyes at bedtime.    . Tiotropium Bromide-Olodaterol (STIOLTO RESPIMAT) 2.5-2.5 MCG/ACT AERS Inhale into the lungs.     No current facility-administered medications on file prior to encounter.    Social History   Socioeconomic History  . Marital status: Legally Separated    Spouse name: Not on file  . Number of children: 1  . Years of education: Not on file  . Highest education level: Not on file  Occupational History  . Occupation: Disability    Comment: Office Work  Scientific laboratory technician  . Financial resource strain: Not on file  . Food insecurity:    Worry: Not on file    Inability: Not on file  . Transportation needs:    Medical: Not on file    Non-medical: Not on file  Tobacco Use  . Smoking status: Former Smoker    Packs/day: 1.50    Years: 14.00    Pack years: 21.00    Types: Cigarettes    Last attempt to quit: 08/09/2003    Years since quitting: 14.6  . Smokeless tobacco: Never Used  Substance and Sexual Activity  . Alcohol use: Yes    Alcohol/week: 0.0 standard drinks    Comment: 02/20/2016 "drank a little in my teens"  . Drug use: No  . Sexual activity: Never  Lifestyle  . Physical activity:    Days per week: Not on file    Minutes per session: Not on file  . Stress: Not on file  Relationships  . Social connections:    Talks on phone: Not on file    Gets together: Not on file    Attends religious service: Not on file    Active member of club or organization: Not on file    Attends meetings of clubs or organizations: Not on file    Relationship status: Not on file  Other Topics Concern  . Not  on file  Social History Narrative  . Not on file   Family History  Problem Relation Age of Onset  . Heart attack Mother   . Prostate cancer Father   . Diabetes Brother   . Kidney failure Brother   . Breast cancer Maternal Aunt   . Colon cancer Neg Hx   . Stomach cancer Neg Hx     Vitals:   03/25/18 1138  BP: (!) 95/54  Pulse: (!) 58  SpO2: (!) 87%  Weight: 77.7 kg (171 lb 6.4 oz)   Wt Readings from Last 3 Encounters:  03/25/18 77.7 kg (171 lb 6.4 oz)  03/17/18 78.9 kg (174 lb)  03/12/18 79.1 kg (174 lb 6.4 oz)   Reds Reading 33%.   General:  Appears chronically ill. Arrived in a wheel chair. HEENT: normal Neck: supple. JVD ~10 . Carotids 2+ bilat; no bruits. No lymphadenopathy or thryomegaly appreciated. Cor: PMI nondisplaced. Regular rate & rhythm. No rubs, gallops . 3/6 SEM RUSB on 6 liters oxygen.  Lungs: clear Abdomen: soft, nontender, nondistended. No hepatosplenomegaly. No bruits or masses. Good bowel sounds. Extremities: no cyanosis, clubbing, rash, RLE LLE 3+ edema Neuro: alert & orientedx3, cranial nerves grossly intact. moves all 4 extremities w/o difficulty. Affect pleasant  Assessment/Plan: 1. Chronic diastolic CHF with pulmonary hypertension/RV failure:   - NYHA IIIb.  - Volume status elevated. Increase torsemide to 80 mg twice a day and continue metolazone twice weekly.   -  No spiro with CKD IV - Continue midodrine 5 mg BID.    2. Chronic respiratory failure:   She has COPD and restriction from her body habitus (OHS). Continue 3 liters oxygen continuously and 6 liters with exertion. Followed by Dr Melvyn Novas  3. OSA: Continue CPAP nightly.  4. CKD: Stage IV.  - Creatinine baseline 2.8-3 She will see Dr Joelyn Oms later this week. - Check BMET 5. Pulmonary hypertension: This is primarily [place] group 2 (pulmonary venous hypertension) based on RHC in 3/17.  She is not a good candidate for pulmonary vasodilators.  No change 6. Mitral regurgitation:  Severe MR on 4/19  TEE due to partial flail of the anterior leaflet (A2 scallop). Now s/p Mitraclip in 6/19.  Post-procedure echo in 6/19 showed mild April, trivial-mild MR.   - Continue ASA 81 post-Mitraclip. Saw Nell Range PA last week.  7. Anemia: Suspect anemia of chronic disease/renal disease but also with component of GI blood loss.  She has had GI bleeding with FOBT+, but unable to visualize bleeding source on EGD, colonoscopy, enteroscopy, or capsule endoscopy (most recently in 7/17).  Seeing hematology (Dr. Irene Limbo), getting Aranesp and Fe infusions with Fe deficiency and possible MDS.  8. Atrial fibrillation: Persistent.   Bradycardic on Toprol XL. Cut back to 25 mg daily.   CHADSVASC = 3.  She was evaluated for Watchman but decided not to be a good candidate. Due to recurrent severe GI bleeding, anticoagulation was stopped altogether. No change.  - No longer on aspirin with GI bleeds.   9. 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 No change  Follow up next week with Dr Aundra Dubin. The last time I spoke with her wee discussed Palliative Care consultation. She is not sure that she wants to pursue.    Darrick Grinder, NP 03/25/2018

## 2018-03-25 NOTE — Patient Instructions (Addendum)
Labs today (will call for abnormal results, otherwise no news is good news)  DECREASE Toprol 25 mg Once Daily  INCREASE Torsemide to 80 mg Twice Daily  Follow up with Dr. Aundra Dubin as scheduled.

## 2018-04-01 ENCOUNTER — Ambulatory Visit (HOSPITAL_COMMUNITY)
Admission: RE | Admit: 2018-04-01 | Discharge: 2018-04-01 | Disposition: A | Discharge status: Home / Self Care | Payer: Medicare Other | Source: Ambulatory Visit | Attending: Cardiology | Admitting: Cardiology

## 2018-04-01 ENCOUNTER — Telehealth: Payer: Self-pay

## 2018-04-01 VITALS — BP 92/50 | HR 62 | Wt 169.0 lb

## 2018-04-01 DIAGNOSIS — I272 Pulmonary hypertension, unspecified: Secondary | ICD-10-CM | POA: Diagnosis not present

## 2018-04-01 DIAGNOSIS — Z9981 Dependence on supplemental oxygen: Secondary | ICD-10-CM | POA: Diagnosis not present

## 2018-04-01 DIAGNOSIS — I340 Nonrheumatic mitral (valve) insufficiency: Secondary | ICD-10-CM | POA: Insufficient documentation

## 2018-04-01 DIAGNOSIS — Z7982 Long term (current) use of aspirin: Secondary | ICD-10-CM | POA: Insufficient documentation

## 2018-04-01 DIAGNOSIS — G4733 Obstructive sleep apnea (adult) (pediatric): Secondary | ICD-10-CM | POA: Insufficient documentation

## 2018-04-01 DIAGNOSIS — D649 Anemia, unspecified: Secondary | ICD-10-CM | POA: Insufficient documentation

## 2018-04-01 DIAGNOSIS — I482 Chronic atrial fibrillation, unspecified: Secondary | ICD-10-CM

## 2018-04-01 DIAGNOSIS — I481 Persistent atrial fibrillation: Secondary | ICD-10-CM | POA: Insufficient documentation

## 2018-04-01 DIAGNOSIS — J9611 Chronic respiratory failure with hypoxia: Secondary | ICD-10-CM | POA: Diagnosis not present

## 2018-04-01 DIAGNOSIS — Z8249 Family history of ischemic heart disease and other diseases of the circulatory system: Secondary | ICD-10-CM | POA: Insufficient documentation

## 2018-04-01 DIAGNOSIS — J9610 Chronic respiratory failure, unspecified whether with hypoxia or hypercapnia: Secondary | ICD-10-CM | POA: Diagnosis not present

## 2018-04-01 DIAGNOSIS — D696 Thrombocytopenia, unspecified: Secondary | ICD-10-CM | POA: Diagnosis not present

## 2018-04-01 DIAGNOSIS — Z841 Family history of disorders of kidney and ureter: Secondary | ICD-10-CM | POA: Insufficient documentation

## 2018-04-01 DIAGNOSIS — Z79899 Other long term (current) drug therapy: Secondary | ICD-10-CM | POA: Diagnosis not present

## 2018-04-01 DIAGNOSIS — N184 Chronic kidney disease, stage 4 (severe): Secondary | ICD-10-CM | POA: Diagnosis not present

## 2018-04-01 DIAGNOSIS — Z833 Family history of diabetes mellitus: Secondary | ICD-10-CM | POA: Insufficient documentation

## 2018-04-01 DIAGNOSIS — I5032 Chronic diastolic (congestive) heart failure: Secondary | ICD-10-CM | POA: Diagnosis present

## 2018-04-01 DIAGNOSIS — Z87891 Personal history of nicotine dependence: Secondary | ICD-10-CM | POA: Diagnosis not present

## 2018-04-01 DIAGNOSIS — M069 Rheumatoid arthritis, unspecified: Secondary | ICD-10-CM | POA: Insufficient documentation

## 2018-04-01 DIAGNOSIS — J449 Chronic obstructive pulmonary disease, unspecified: Secondary | ICD-10-CM | POA: Diagnosis not present

## 2018-04-01 LAB — BASIC METABOLIC PANEL
Anion gap: 10 (ref 5–15)
BUN: 99 mg/dL — AB (ref 8–23)
CHLORIDE: 100 mmol/L (ref 98–111)
CO2: 32 mmol/L (ref 22–32)
Calcium: 10.5 mg/dL — ABNORMAL HIGH (ref 8.9–10.3)
Creatinine, Ser: 4.45 mg/dL — ABNORMAL HIGH (ref 0.44–1.00)
GFR calc Af Amer: 11 mL/min — ABNORMAL LOW (ref >60)
GFR calc non Af Amer: 9 mL/min — ABNORMAL LOW (ref >60)
GLUCOSE: 110 mg/dL — AB (ref 70–99)
Potassium: 3.5 mmol/L (ref 3.5–5.1)
Sodium: 142 mmol/L (ref 135–145)

## 2018-04-01 LAB — CBC
HEMATOCRIT: 30.5 % — AB (ref 36.0–46.0)
Hemoglobin: 9 g/dL — ABNORMAL LOW (ref 12.0–15.0)
MCH: 29.9 pg (ref 26.0–34.0)
MCHC: 29.5 g/dL — ABNORMAL LOW (ref 30.0–36.0)
MCV: 101.3 fL — AB (ref 78.0–100.0)
Platelets: 56 10*3/uL — ABNORMAL LOW (ref 150–400)
RBC: 3.01 MIL/uL — ABNORMAL LOW (ref 3.87–5.11)
RDW: 16.7 % — AB (ref 11.5–15.5)
WBC: 4.9 10*3/uL (ref 4.0–10.5)

## 2018-04-01 MED ORDER — METOLAZONE 2.5 MG PO TABS: 2.5000 mg | ORAL_TABLET | ORAL | 3 refills | Status: DC

## 2018-04-01 MED ORDER — TORSEMIDE 20 MG PO TABS: 100.0000 mg | ORAL_TABLET | Freq: Two times a day (BID) | ORAL | 11 refills | Status: DC

## 2018-04-01 MED ORDER — MIDODRINE HCL 5 MG PO TABS: 5.0000 mg | ORAL_TABLET | Freq: Two times a day (BID) | ORAL | 6 refills | Status: DC

## 2018-04-01 NOTE — Patient Instructions (Signed)
Increase Torsemide to 100 mg Twice daily   Increase Metolazone to 2.5 mg three times a week, EVERY MON, WED AND Friday  Labs today  Labs in 1 week  Your physician recommends that you schedule a follow-up appointment in: 1 month

## 2018-04-01 NOTE — Progress Notes (Signed)
Patient ID: CHAIA IKARD, female   DOB: 1945/11/21, 72 y.o.   MRN: [id number]   HPI  PCP: Dr Harrington Challenger Pulmonary: Dr Byrum/Dr Melvyn Novas  Cardiology: Dr. Aundra Dubin  Ms Sawka is a 72 y.o. with COPD on home oxygen, rheumatoid arthritis, chronic atrial fibrillation, chronic diastolic CHF, CKD.    Moved back from [place] and saw Dr. Lamonte Sakai. In [place] was followed by cardiology and pulmonary. Started on Tracleer in 2006. Revatio was added, however was discontinued without any change in her symptoms. Stopped smoking in January 2005 when she had aortic aneurysm repair. Smoked 1.5-2 ppd x 15 - 20 years.  Later on macitentan.  I had her do a TEE in 6/16.  She had a loud MR murmur.  TEE showed very eccentric, posteriorly-directed MR that may be from subtle prolapse of the anterior mitral leaflet.  I am concerned that the MR could be severe but very difficult to fully visualize.  She additionally had PFTs done that were abnormal, but according to her pulmonologist Melvyn Novas), the obstruction was really only minimal and the restriction was primarily related to body habitus.  He does not think that we can explain her hypoxemia and dyspnea primarily from parenchymal lung pathology.  I sent her to [facility] for evaluation for percutaneous mitral valve clip.  They did not think that she was a good candidate.   She has been anemic and received 1 unit PRBCs in 8/16.  She saw GI and was noted to be heme negative, no scopes were done (heme negative and high risk). She has had iron infusions.   Opsumit was stopped in 9/16 due to suspicion for group 2 pulmonary hypertension only (low PVR) and she felt better off it.   She was admitted in 1/17 with increased dyspnea and was found to be profoundly anemic.  She had a total of 5 units PRBCs and an iron infusion in the hospital.  Stool was FOBT+, melena.  EGD, colonoscopy, and capsule endoscopy were all done without clear source of blood loss.  While in the hospital, she was noted to go into atrial  fibrillation with RVR.  She initially required amiodarone for rate control, but was transitioned over to Toprol XL. She was not anticoagulated due to concern for GI blood loss and anemia.  Anticoagulation was attempted later.   RHC in 3/17 showed elevated right heart filling pressures and mildly elevated PCWP.  There was mild pulmonary hypertension, likely pulmonary venous hypertension with PVR 1.9 WU.   She was admitted in 7/17 with symptomatic anemia, received 3 units PRBCs.  Anticoagulation was stopped.  Small bowel enteroscopy did not show a source of bleeding.    She was seen by Dr Rayann Heman, he recommended against Watchman. She is now off anticoagulation.  Has had Fe infusions and getting Aranesp.   Admitted 12/7 through 07/23/16. Treated for UTI, sepsis and volume overload. Discharged to [facility] then later home.   Recently seen with increased dyspnea.  Repeat echo showed severe MR so TEE was done.  TEE in 4/19 showed severe MR with partial flail A2 scallop and moderate-severe TR.   In 6/19, she was admitted for successful Mitraclip placement.  She was treated for CHF in the hospital for several days after this, then discharged to SNF.  Most recent echo in 8/19 shows EF 60-65%, Mitraclip with mild-moderate MR, probably no significant stenosis.  There is severe pulmonary hypertension, dilated RV, and severe TR.  Significant RV failure.   She returns for follow  up of CHF.  She is now at home.  She is walking with her walker, less short of breath but still is winded walking more than about 100 feet.  Occasional lightheadedness with standing, she was not orthostatic when checked today but BP remains soft.  She is still on midodrine.  She has uncomfortable lower extremity edema.  Seen recently by nephrology (Dr. Joelyn Oms). Weight down 2 lbs. She says that she feels "shaky" and fatigued.    ECG (personally reviewed): Atrial fibrillation rate 50s  Labs 02/05/13 K 3.8 Creatinine 1.37 05/08/13 K  3.8, Creatinine 1.58 11/14 K 4.1, creatinine 1.82 08/09/2014: K 4.0 Creatinine 1.45  5/16: K 3.7, creatinine 1.76, BNP 447 6/16: K 5 => 4.6, creatinine 2.09 => 2.13, BNP 291 8/16: K 4.7, creatinine 2.1, HCT 30.7 9/16: K 4.5, creatinine 2.3, HCT 34.1 1/17: K 4.8, creatinine 1.84 => 1.87, HCT 29.7, plts 136, FOBT+, BNP 403 2/17: K 3.5, creatinine 1.89, BNP 329, hgb 10 3/17: K 3.8, creatinine 2.5 => 2.3, HCT 35.4 => 33.5 5/17: K 3.7, creatinine 2.0, hgb 10.6 7/17: K 3.7, creatinine 2, hgb 9.9 8/17: K 3.8, creatinine 1.9, HCT 31.3 9/17: K 3.9, creatinine 2 07/23/2016: K 4.3 Creatinine 2.76  08/02/2016: K 4.7 Creatinine 2.0  4/18: K 4, creatinine 2.3, hgb 10.7 8/18: K 4, creatinine 2.13, hgb 10.4, plts 83 9/18: LDL 59 11/18: K 4, creatinine 2.2 12/18: hgb 10.7 4/19: K 4.2, creatinine 2.6 6/19: K 3.5, creatnine 3.69 7/19: K 4.4, creatinine 2.79 8/19: K 4.4, creatinine 4.03  1) COPD: Former smoker (28 pk-yrs).  On home O2 - 4L/min rest, up to 5L/min exertion.  - spirometry 6/13 in [place] = 0.85L FVC = 1.3L no DLCO measured. FEV1 in 2014 = 0.73  - CT chest 10/14 - moderate centrilobular emphysema. No pulm fibrosis but there was evidence for post-infectious/inflammatory scarring. Stable Asc Ao repair - PFTs (11/14) FVC 46%, FEV1 0.8L (40%), ratio 87%, FEF 25-75% 0.44L DLCO 22%, TLC 50% => PFTs in 06/2013 did NOT suggest significant airflow obstruction according to Dr Gustavus Bryant last note despite "moderate emphysema" on CT.  - PFTs (6/16) were abnormal with severe obstruction, severe restriction, severely decreased DLCO.  Reviewed with Dr Melvyn Novas => he thinks obstruction is actually fairly minimal and that the restriction is due to body habitus.   2) Obesity 3) RA: On plaquenil, sees Dr Lenna Gilford.  4) Type A aortic dissection s/p repair 2005: c/b renal failure requiring HD x 3 months 5) PAH - previously treated in [place].  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  - V/Q scan 11/14 no chronic PE - 2015 Tracleer stopped and  started on macitentan. 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  - RHC 08/18/2014: No role for pulmonary vasodilators.  RA = 14 RV = 62/3/15 PA = 63/21 (38) PCW = mean 23 v waves to 40 Fick cardiac output/index = 6.3/3.2 Thermodilution CO/CI = 6.1/3.1, PVR = 2.0 WU, FA sat = 91% PA sat = 57%, 62% - RHC 3/17: mean RA 13, PA 44/2 mean 29, mean PCWP 17, CI 3.04, PVR 1.9 WU => pulmonary venous hypertension. 6) OSA 7) Diastolic CHF - ECHO 2/70 EF 55-60% RV mildly dilated with normal function. Mild to moderate posterior MR. RVSP 56. Probable restrictive diastolic filling pattern - ECHO 9/15 EF 60-65% RV mildly dilated with normal function. Mild to moderate posterior MR. RVSP 40. Ao Root ok - TEE 6/16 with EF 55-60%, D-shaped interventricular septum suggestive of RV pressure/volume  overload, RV mildly dilated with normal systolic function, peak RV-RA gradient 57 mmHg, very eccentric posteriorly-directed mitral regurgitation, possibly severe, etiology may be a very small area of prolapse on the anterior leaflet, s/p ascending aorta repair with residual dissection flap in the descending thoracic aorta.   - Echo (1/17) with EF 55-60%, mild to moderate MR, PASP 66 - Echo (12/17) with EF 55-60%, grade II diastolic dysfunction, mild MR, mildly decreased RV systolic function, PASP 66 mmHg.  - Echo (8/19): EF 60-65%, s/p MItraclip with mild-moderate MR, mean MV gradient 6 mmHg with MVA 2.1 cm^2 by PHT (suspect no significant stenosis), RV severely dilaetd, severe biatrial enlargement, PASP 74 mmHg, IVC dilated, severe TR.  8) CKD stage IV 9) Mitral regurgitation: Possibly severe by 6/16 TEE, very eccentric.  Not good candidate for percutaneous MV clip (seen at [facility]).  Echo (1/17) with only mild to moderate MR.  - TEE (4/19): EF 55-60%, severe MR with partial flail A2 scallop, mildly decreased RV systolic function, moderate-severe TR, peak RV-RA gradient 43 mmHg.  - Mitraclip  placement 6/19.  - Echo (6/19, post-Mitraclip): EF 60-65%, mild AS, IV septal flattening, s/p Mitraclip with mean gradient 5 mmHg and MVA 1.6 cm^2 by PHT, trivial-mild MR, severe TR, PASP 60 mmHg.  - Echo (8/19): EF 60-65%, s/p MItraclip with mild-moderate MR, mean MV gradient 6 mmHg with MVA 2.1 cm^2 by PHT (suspect no significant stenosis), RV severely dilaetd, severe biatrial enlargement, PASP 74 mmHg, IVC dilated, severe TR.  10) Anemia: Suspect from chronic disease/renal disease and well as chronic GI bleeding.  EGD/colonoscopy/capsule endoscopy in 1/17 did not show any definite site for blood loss.  Small bowel enteroscopy in 7/17 did not show etiology of bleeding. 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  Getting Fe infusions.  11) Atrial fibrillation: Persistent, 1st noted in 1/17.  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  13) Tricuspid regurgitation: Severe.   ROS: All systems reviewed and negative except as per HPI.  Current Outpatient Medications on File Prior to Encounter  Medication Sig Dispense Refill  . acetaminophen (TYLENOL) 500 MG tablet Take 1,000 mg by mouth every 6 (six) hours as needed for moderate pain or headache.    . allopurinol (ZYLOPRIM) 300 MG tablet Take 300 mg by mouth daily.    [name] ALPRAZolam (XANAX) 0.25 MG tablet Take 1 tablet (0.25 mg total) by mouth every 12 (twelve) hours as needed for anxiety or sleep. (Patient taking differently: Take 0.25 mg by mouth at bedtime. ) 60 tablet 5  . aspirin 81 MG chewable tablet Chew 1 tablet (81 mg total) by mouth daily. 30 tablet 6  . atorvastatin (LIPITOR) 10 MG tablet Take 1 tablet (10 mg total) by mouth daily. 90 tablet 2  . Biotin 5000 MCG CAPS Take 5,000 mcg by mouth daily.     [name] gabapentin (NEURONTIN) 100 MG capsule Take 100 mg by mouth daily.    . hydroxychloroquine (PLAQUENIL) 200 MG tablet Take 200 mg by mouth 2 (two) times daily.    . metoprolol succinate (TOPROL-XL) 25 MG 24 hr tablet Take 1  tablet (25 mg total) by mouth daily. 30 tablet 3  . Multiple Vitamin (MULTIVITAMIN WITH MINERALS) TABS tablet Take 1 tablet by mouth daily.    . Omega-3 Fatty Acids (FISH OIL) 1200 MG CAPS Take 1,200 mg by mouth daily.    . OXYGEN Inhale 3 L into the lungs continuous.     . Polyethylene Glycol 400 (BLINK TEARS OP) Place 1-2 drops into both  eyes at bedtime.    . potassium chloride SA (K-DUR,KLOR-CON) 20 MEQ tablet Take 2 tablets (40 mEq total) by mouth 2 (two) times daily. 120 tablet 6  . Tiotropium Bromide-Olodaterol (STIOLTO RESPIMAT) 2.5-2.5 MCG/ACT AERS Inhale into the lungs.     No current facility-administered medications on file prior to encounter.    Social History   Socioeconomic History  . Marital status: Legally Separated    Spouse name: Not on file  . Number of children: 1  . Years of education: Not on file  . Highest education level: Not on file  Occupational History  . Occupation: Disability    Comment: Office Work  Scientific laboratory technician  . Financial resource strain: Not on file  . Food insecurity:    Worry: Not on file    Inability: Not on file  . Transportation needs:    Medical: Not on file    Non-medical: Not on file  Tobacco Use  . Smoking status: Former Smoker    Packs/day: 1.50    Years: 14.00    Pack years: 21.00    Types: Cigarettes    Last attempt to quit: 08/09/2003    Years since quitting: 14.6  . Smokeless tobacco: Never Used  Substance and Sexual Activity  . Alcohol use: Yes    Alcohol/week: 0.0 standard drinks    Comment: 02/20/2016 "drank a little in my teens"  . Drug use: No  . Sexual activity: Never  Lifestyle  . Physical activity:    Days per week: Not on file    Minutes per session: Not on file  . Stress: Not on file  Relationships  . Social connections:    Talks on phone: Not on file    Gets together: Not on file    Attends religious service: Not on file    Active member of club or organization: Not on file    Attends meetings of clubs or  organizations: Not on file    Relationship status: Not on file  Other Topics Concern  . Not on file  Social History Narrative  . Not on file   Family History  Problem Relation Age of Onset  . Heart attack Mother   . Prostate cancer Father   . Diabetes Brother   . Kidney failure Brother   . Breast cancer Maternal Aunt   . Colon cancer Neg Hx   . Stomach cancer Neg Hx     Vitals:   04/01/18 0851 04/01/18 0854  BP: (!) 100/52 (!) 92/50  Pulse: 62   SpO2: 95%   Weight: 76.7 kg (169 lb)    General: NAD Neck: JVP 12-14 cm with prominent CV waves, no thyromegaly or thyroid nodule.  Lungs: Clear to auscultation bilaterally with normal respiratory effort. CV: Nondisplaced PMI.  Heart regular S1/S2, no S3/S4, 3/6 HSM LLSB.  2+ edema to knees.  No carotid bruit.  Unable to palpate pedal pulses with significant edema.  Abdomen: Soft, nontender, no hepatosplenomegaly, no distention.  Skin: Intact without lesions or rashes.  Neurologic: Alert and oriented x 3.  Psych: Normal affect. Extremities: No clubbing or cyanosis.  HEENT: Normal.   Assessment/Plan: 1. Chronic diastolic CHF with pulmonary hypertension/prominent RV failure:  She remains very volume overloaded on exam, think this is primarily due to RV failure in setting of severe TR.  This is getting more and more difficult to control due to worsening renal function.  NYHA class III-IIIb symptoms.  She is less short of breath since Mitraclip  but still gets very fatigued.  - Increase torsemide to 100 mg bid and metolazone to 2.5 mg three times a week.  She will get BMET today and again in 1 week. May have to change plan if renal function is significantly worsened.  - She cannot tolerate compression stockings.  - Continue midodrine to maintain BP, would not stop as BP is soft today and she has occasional orthostatic symptoms.  2. Chronic respiratory failure:  She is on 3 liters at rest, 4-5 L with exertion home oxygen by [place]. She has COPD  and restriction from her body habitus (OHS).  3. OSA: Continue CPAP nightly.  4. CKD: Stage IV. Creatinine has been higher than 4 recently.  I am concerned that her "shakiness" could be related to uremia.  She is seeing Dr. Joelyn Oms for nephrology.  I am having a hard time managing her volume in the setting of progressive cardiorenal syndrome.  She may end up needing dialysis, peritoneal dialysis may be best option given RV failure.  We discussed possible need for dialysis today if renal function progressively worsens.  She will need close followup with nephrology.  5. Pulmonary hypertension: This is primarily [place] group 2 (pulmonary venous hypertension) based on RHC in 3/17.  She is not a good candidate for pulmonary vasodilators.   6. Mitral regurgitation:  Severe MR on 4/19 TEE due to partial flail of the anterior leaflet (A2 scallop). Now s/p Mitraclip in 6/19.  Post-procedure echo in 6/19 showed mild MS, trivial-mild MR, echo in 8/19 showed mild-moderate MR, probably no significant MS.   - She is on ASA 81 post-Mitraclip, no overt bleeding.  7. Anemia: Suspect anemia of chronic disease/renal disease but also with component of GI blood loss.  She has had GI bleeding with FOBT+, but unable to visualize bleeding source on EGD, colonoscopy, enteroscopy, or capsule endoscopy (most recently in 7/17).  Seeing hematology (Dr. Irene Limbo), getting Aranesp and Fe infusions with Fe deficiency and possible MDS.  - CBC today.  8. Atrial fibrillation: Persistent.  Rate is controlled.  CHADSVASC = 3.  She was evaluated for Watchman but decided not to be a good candidate. Due to recurrent severe GI bleeding, anticoagulation was stopped altogether. No stroke-like symptoms.  9. 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 CBC today.   See me again in 1 month. Follow labs closely.   [name] MD 04/01/2018

## 2018-04-01 NOTE — Telephone Encounter (Signed)
Phone call placed to patient to schedule visit with Palliative Care. Visit scheduled for 04/11/18

## 2018-04-02 ENCOUNTER — Telehealth (HOSPITAL_COMMUNITY): Payer: Self-pay | Admitting: Cardiology

## 2018-04-02 MED ORDER — MIDODRINE HCL 5 MG PO TABS: 5.0000 mg | ORAL_TABLET | Freq: Two times a day (BID) | ORAL | 2 refills | Status: AC

## 2018-04-02 NOTE — Telephone Encounter (Signed)
Lauren, OTA called during patients home visit today to report abnormal vitals.  BP 80/52 HR 47-60  Patient reports she has not eaten nor had much to drink today, Lauren did have patient drink 2 ensure cans.  Also during phone call lab results were reviewed from 8/27.   Above reviewed with Rebecca Eaton Increase midodrine to 5 mg, TID  Detailed message left for patient with medication changes, advised to return call to confirm message instructions   Will follow up 8/29 to confirm med changes j

## 2018-04-02 NOTE — Addendum Note (Signed)
Encounter addended by: Scarlette Calico, RN on: 04/02/2018 10:00 AM  Actions taken: Pharmacy for encounter modified, Order list changed

## 2018-04-03 NOTE — Telephone Encounter (Signed)
Patient confirmed she did receive message however she is hesitant to increase medication, reports bp reading of 80 was on 8/27 not 8/28 during OT home visit, reports her SBP readings for the past two days have been 106 and 108   Advised she may continue current dose, however if reading drop below 90 and or she becomes symptomatic she should call office for further instructions. Patient voiced understanding

## 2018-04-04 ENCOUNTER — Telehealth (HOSPITAL_COMMUNITY): Payer: Self-pay | Admitting: *Deleted

## 2018-04-04 MED ORDER — METOLAZONE 2.5 MG PO TABS: 2.5000 mg | ORAL_TABLET | ORAL | 3 refills | Status: AC

## 2018-04-04 NOTE — Telephone Encounter (Signed)
-----   Message from Larey Dresser, MD sent at 04/01/2018 11:46 PM EDT ----- Hemoglobin stable, BUN/creatinine remain high.  Platelet chronically low.  As creatinine is even higher than prior, would have her NOT increase metolazone to three times a week (keep at two times a week) but she should keep torsemide at higher dose of 100 mg bid with volume overload.

## 2018-04-08 ENCOUNTER — Ambulatory Visit (HOSPITAL_COMMUNITY)
Admission: RE | Admit: 2018-04-08 | Discharge: 2018-04-08 | Disposition: A | Discharge status: Home / Self Care | Payer: Medicare Other | Source: Ambulatory Visit | Attending: Cardiology | Admitting: Cardiology

## 2018-04-08 DIAGNOSIS — I5032 Chronic diastolic (congestive) heart failure: Secondary | ICD-10-CM | POA: Diagnosis present

## 2018-04-08 LAB — BASIC METABOLIC PANEL
ANION GAP: 11 (ref 5–15)
BUN: 108 mg/dL — AB (ref 8–23)
CO2: 32 mmol/L (ref 22–32)
Calcium: 10.7 mg/dL — ABNORMAL HIGH (ref 8.9–10.3)
Chloride: 99 mmol/L (ref 98–111)
Creatinine, Ser: 4.83 mg/dL — ABNORMAL HIGH (ref 0.44–1.00)
GFR calc Af Amer: 10 mL/min — ABNORMAL LOW (ref >60)
GFR calc non Af Amer: 8 mL/min — ABNORMAL LOW (ref >60)
GLUCOSE: 103 mg/dL — AB (ref 70–99)
POTASSIUM: 4.5 mmol/L (ref 3.5–5.1)
Sodium: 142 mmol/L (ref 135–145)

## 2018-04-10 ENCOUNTER — Telehealth (HOSPITAL_COMMUNITY): Payer: Self-pay | Admitting: *Deleted

## 2018-04-10 NOTE — Telephone Encounter (Signed)
Result Notes for Basic metabolic panel   Notes recorded by Darron Doom, RN on 04/10/2018 at 10:03 AM EDT Called and spoke with patient, she takes metolazone on Monday and Thursdays. She will hold it today and Monday and will plan to restart it next Thursday. No further questions. ------  Notes recorded by Larey Dresser, MD on 04/08/2018 at 10:27 PM EDT Creatinine higher again. Hold 2 doses of metolazone and decrease metolazone to twice weekly.

## 2018-04-11 ENCOUNTER — Other Ambulatory Visit: Payer: Self-pay | Admitting: Nurse Practitioner

## 2018-04-18 ENCOUNTER — Telehealth (HOSPITAL_COMMUNITY): Payer: Self-pay

## 2018-04-18 NOTE — Telephone Encounter (Signed)
Attempted to call patient to see if she is still recv'ing rehab. LM on VM

## 2018-04-18 NOTE — Telephone Encounter (Signed)
Called to follow up with patient in regards to Cardiac Rehab - Patient asked "why do you need to know how much longer I have of HHPT?" I stated to patient she expressed interest in Cardiac Rehab so we were calling to follow up with you to see if you still wanted to participate. Patient stated not right now. Closed referral.

## 2018-04-21 ENCOUNTER — Other Ambulatory Visit (HOSPITAL_COMMUNITY): Payer: Self-pay | Admitting: *Deleted

## 2018-04-21 ENCOUNTER — Telehealth (HOSPITAL_COMMUNITY): Payer: Self-pay | Admitting: *Deleted

## 2018-04-21 ENCOUNTER — Telehealth: Payer: Self-pay | Admitting: Cardiovascular Disease

## 2018-04-21 MED ORDER — TORSEMIDE 20 MG PO TABS: 100.0000 mg | ORAL_TABLET | Freq: Two times a day (BID) | ORAL | 2 refills | Status: AC

## 2018-04-21 NOTE — Telephone Encounter (Signed)
New Message:      FYI:    Danella Sensing  (Education officer, museum) with [facility] called to notify the initial MSW home visit was delayed and will need to be rescheduled.

## 2018-04-21 NOTE — Telephone Encounter (Signed)
Pt called concerned about Metolazone dosing, she isn't sure if she should take it twice a week or once a week.  Advised per chart it should be twice a week, she is concerned about her kidney function as it was high last time.  She has [place] w/[facility], order faxed to them to get BMET end of this week or early next week to check kidney function, pt is agreeable and thankful.

## 2018-05-01 ENCOUNTER — Other Ambulatory Visit (HOSPITAL_COMMUNITY): Payer: Self-pay | Admitting: Cardiology

## 2018-05-02 ENCOUNTER — Telehealth (HOSPITAL_COMMUNITY): Payer: Self-pay

## 2018-05-02 NOTE — Telephone Encounter (Signed)
I called Ms Podgurski to schedule an initial home visit. She stated she was not doing well and she was likely going to have a tube placed in her stomach next week. She added that she is being seen by two home health nurses twice per week and she also has in home physical therapy. I advised that I would be working specifically for the HF team but if she wants to wait until she has fewer people coming, I would communicate that to the clinic. She was understandable and agreeable.

## 2018-05-05 ENCOUNTER — Encounter (HOSPITAL_COMMUNITY): Payer: Medicare Other

## 2018-05-07 ENCOUNTER — Telehealth (HOSPITAL_COMMUNITY): Payer: Self-pay | Admitting: Surgery

## 2018-05-07 NOTE — Telephone Encounter (Signed)
Patient refuses HF Community Paramedicine program at this time secondary to frequent other visits from other agencies.  She will continue to follow in the [facility] and is aware to call the clinic with any concerns.

## 2018-05-12 NOTE — Telephone Encounter (Signed)
Opened in error

## 2018-05-17 ENCOUNTER — Emergency Department (HOSPITAL_COMMUNITY)
Admission: EM | Admit: 2018-05-17 | Discharge: 2018-05-17 | Disposition: A | Discharge status: Home / Self Care | Payer: Medicare Other | Attending: Emergency Medicine | Admitting: Emergency Medicine

## 2018-05-17 ENCOUNTER — Other Ambulatory Visit: Payer: Self-pay

## 2018-05-17 DIAGNOSIS — Z7982 Long term (current) use of aspirin: Secondary | ICD-10-CM | POA: Insufficient documentation

## 2018-05-17 DIAGNOSIS — J449 Chronic obstructive pulmonary disease, unspecified: Secondary | ICD-10-CM | POA: Insufficient documentation

## 2018-05-17 DIAGNOSIS — I5032 Chronic diastolic (congestive) heart failure: Secondary | ICD-10-CM | POA: Insufficient documentation

## 2018-05-17 DIAGNOSIS — N183 Chronic kidney disease, stage 3 (moderate): Secondary | ICD-10-CM | POA: Insufficient documentation

## 2018-05-17 DIAGNOSIS — R1084 Generalized abdominal pain: Secondary | ICD-10-CM | POA: Diagnosis present

## 2018-05-17 DIAGNOSIS — I130 Hypertensive heart and chronic kidney disease with heart failure and stage 1 through stage 4 chronic kidney disease, or unspecified chronic kidney disease: Secondary | ICD-10-CM | POA: Insufficient documentation

## 2018-05-17 DIAGNOSIS — Z87891 Personal history of nicotine dependence: Secondary | ICD-10-CM | POA: Diagnosis not present

## 2018-05-17 DIAGNOSIS — K5900 Constipation, unspecified: Secondary | ICD-10-CM | POA: Diagnosis not present

## 2018-05-17 DIAGNOSIS — Z79899 Other long term (current) drug therapy: Secondary | ICD-10-CM | POA: Diagnosis not present

## 2018-05-17 LAB — COMPREHENSIVE METABOLIC PANEL
ALK PHOS: 64 U/L (ref 38–126)
ALT: 13 U/L (ref 0–44)
AST: 34 U/L (ref 15–41)
Albumin: 2.5 g/dL — ABNORMAL LOW (ref 3.5–5.0)
Anion gap: 10 (ref 5–15)
BUN: 109 mg/dL — AB (ref 8–23)
CALCIUM: 9.5 mg/dL (ref 8.9–10.3)
CHLORIDE: 99 mmol/L (ref 98–111)
CO2: 29 mmol/L (ref 22–32)
CREATININE: 6.56 mg/dL — AB (ref 0.44–1.00)
GFR calc Af Amer: 7 mL/min — ABNORMAL LOW (ref >60)
GFR calc non Af Amer: 6 mL/min — ABNORMAL LOW (ref >60)
GLUCOSE: 104 mg/dL — AB (ref 70–99)
Potassium: 4.1 mmol/L (ref 3.5–5.1)
SODIUM: 138 mmol/L (ref 135–145)
Total Bilirubin: 0.9 mg/dL (ref 0.3–1.2)
Total Protein: 5.7 g/dL — ABNORMAL LOW (ref 6.5–8.1)

## 2018-05-17 LAB — CBC WITH DIFFERENTIAL/PLATELET
ABS IMMATURE GRANULOCYTES: 0.06 10*3/uL (ref 0.00–0.07)
Basophils Absolute: 0 10*3/uL (ref 0.0–0.1)
Basophils Relative: 0 %
Eosinophils Absolute: 0.2 10*3/uL (ref 0.0–0.5)
Eosinophils Relative: 3 %
HEMATOCRIT: 28.2 % — AB (ref 36.0–46.0)
HEMOGLOBIN: 8 g/dL — AB (ref 12.0–15.0)
Immature Granulocytes: 1 %
LYMPHS ABS: 0.8 10*3/uL (ref 0.7–4.0)
LYMPHS PCT: 14 %
MCH: 29 pg (ref 26.0–34.0)
MCHC: 28.4 g/dL — ABNORMAL LOW (ref 30.0–36.0)
MCV: 102.2 fL — ABNORMAL HIGH (ref 80.0–100.0)
MONOS PCT: 10 %
Monocytes Absolute: 0.6 10*3/uL (ref 0.1–1.0)
NEUTROS ABS: 4.2 10*3/uL (ref 1.7–7.7)
Neutrophils Relative %: 72 %
Platelets: 105 10*3/uL — ABNORMAL LOW (ref 150–400)
RBC: 2.76 MIL/uL — AB (ref 3.87–5.11)
RDW: 16.6 % — ABNORMAL HIGH (ref 11.5–15.5)
WBC: 5.9 10*3/uL (ref 4.0–10.5)
nRBC: 0 % (ref 0.0–0.2)

## 2018-05-17 LAB — LIPASE, BLOOD: Lipase: 78 U/L — ABNORMAL HIGH (ref 11–51)

## 2018-05-17 LAB — I-STAT CG4 LACTIC ACID, ED: LACTIC ACID, VENOUS: 1.14 mmol/L (ref 0.5–1.9)

## 2018-05-17 NOTE — ED Notes (Signed)
ED Provider at bedside. 

## 2018-05-17 NOTE — ED Provider Notes (Signed)
April Mueller   CSN: [id number] Arrival date & time: 05/17/18  1905     History   Chief Complaint Chief Complaint  Patient presents with  . Abdominal Pain    HPI Mueller April is a 72 y.o. female.  She has a history of end-stage renal disease and is undergoing peritoneal dialysis for the last 5 days.  She is coming in here with constipation and abdominal pain that is been going on for for 5 days.  She is tried some laxatives which are causing her stomach pain to be worse.  No fever no chest pain or shortness of breath.  EMS found her with a low blood pressure in the 60s and a low heart rate.  Patient states her blood pressure usually runs very low.  The history is provided by the patient.  Abdominal Pain   This is a new problem. The current episode started more than 2 days ago. The problem occurs constantly. The problem has not changed since onset.The pain is associated with an unknown factor. The pain is located in the generalized abdominal region. The quality of the pain is cramping. The pain is severe. Associated symptoms include constipation. Pertinent negatives include fever, diarrhea, hematochezia, vomiting, dysuria and headaches. Nothing aggravates the symptoms. Nothing relieves the symptoms.    Past Medical History:  Diagnosis Date  . Anemia 03/23/15   transfusion  . Aortic aneurysm ([place])   . CHF (congestive heart failure) ([place])   . Chronic lower back pain   . Chronic thoracic aortic dissection (HCC)    S/P repair ascending thoracic aortic dissection with chronic residual dissection involving transverse aortic arch and descending thoracic aorta  . CKD (chronic kidney disease) stage 3, GFR 30-59 ml/min (HCC)    "on dialysis for 3 months after my aneurysm in 2005" (02/20/2016)  . COPD (chronic obstructive pulmonary disease) ([place])   . Gout   . History of blood transfusion "several"  . Hypertension   . Lupus ([place])   . On  home oxygen therapy    "3L; 24/7" (02/20/2016)  . OSA on CPAP   . Pulmonary HTN ([place])   . Rheumatoid arthritis(714.0) 11/06/2012  . S/P aortic dissection repair   . Tuberculosis    dormant carrier    Patient Active Problem List   Diagnosis Date Noted  . AKI (acute kidney injury) ([place])   . Chronic atrial fibrillation   . Hypoxemia   . Acute respiratory failure with hypoxemia ([place])   . Respiratory failure ([place])   . Acute pulmonary edema (HCC)   . Acute on chronic systolic heart failure ([place]) 01/29/2018  . Severe mitral regurgitation 01/29/2018  . S/P aortic dissection repair   . Chronic thoracic aortic dissection (HCC)   . PAF (paroxysmal atrial fibrillation) ([place]) 11/06/2016  . Sepsis due to Escherichia coli ([place])   . Sepsis ([place]) 07/12/2016  . Thrombocytopenia ([place]) 07/12/2016  . Urinary tract infection with hematuria   . Fever   . Hypotension   . Iron deficiency anemia due to chronic blood loss 11/03/2015  . Chronic diastolic CHF (congestive heart failure) ([place]) 09/26/2015  . Persistent atrial fibrillation   . History of colonic polyps   . Benign neoplasm of cecum   . Benign neoplasm of ascending colon   . Heme positive stool   . Anemia 08/17/2015  . Symptomatic anemia 08/16/2015  . HTN (hypertension) 08/16/2015  . Leukocytosis 08/16/2015  . Acute hyperkalemia 08/16/2015  . 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 Anemia of chronic disease 04/26/2015  . CKD (chronic kidney disease) stage 3, GFR 30-59 ml/min (HCC) 04/22/2015  . Normocytic anemia 04/04/2015  . Morbid obesity ([place]) 01/22/2015  . Mitral regurgitation 12/29/2014  . Dyspnea 08/18/2014  . Chronic respiratory failure with hypoxia ([place]) 08/09/2014  . 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 Rheumatoid arthritis ([place]) 11/06/2012  . 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 OSA on CPAP 11/06/2012    Past Surgical History:  Procedure Laterality Date  . BREAST  EXCISIONAL BIOPSY Right    benign  . BREAST LUMPECTOMY Right   . CARDIAC CATHETERIZATION N/A 10/24/2015   Procedure: Right Heart Cath;  Surgeon: Larey Dresser, MD;  Location: [place];  Service: Cardiovascular;  Laterality: N/A;  . CATARACT EXTRACTION W/ INTRAOCULAR LENS  IMPLANT, BILATERAL Bilateral 2016  . COLONOSCOPY N/A 08/19/2015   Procedure: COLONOSCOPY;  Surgeon: Jerene Bears, MD;  Location: [facility] ENDOSCOPY;  Service: Endoscopy;  Laterality: N/A;  . ENTEROSCOPY N/A 02/22/2016   Procedure: ENTEROSCOPY;  Surgeon: Milus Banister, MD;  Location: [facility];  Service: Endoscopy;  Laterality: N/A;  . ESOPHAGOGASTRODUODENOSCOPY N/A 08/19/2015   Procedure: ESOPHAGOGASTRODUODENOSCOPY (EGD);  Surgeon: Jerene Bears, MD;  Location: [facility] ENDOSCOPY;  Service: Endoscopy;  Laterality: N/A;  patient scheduled, anesthesia aware of 1500 case, per [place].  08/18/15 DP  . GIVENS CAPSULE STUDY N/A 08/21/2015   Procedure: GIVENS CAPSULE STUDY;  Surgeon: Gatha Mayer, MD;  Location: [facility];  Service: Endoscopy;  Laterality: N/A;  . HEMORRHOID SURGERY    . MITRAL VALVE REPAIR N/A 01/29/2018   Procedure: MITRAL VALVE REPAIR;  Surgeon: Sherren Mocha, MD;  Location: [place];  Service: Cardiovascular;  Laterality: N/A;  . REPAIR OF ACUTE ASCENDING THORACIC AORTIC DISSECTION  2005  . RIGHT HEART CATHETERIZATION N/A 08/18/2014   Procedure: RIGHT HEART CATH;  Surgeon: Jolaine Artist, MD;  Location[facility] CATH LAB;  Service: Cardiovascular;  Laterality: N/A;  . TEE WITHOUT CARDIOVERSION N/A 01/05/2015   Procedure: TRANSESOPHAGEAL ECHOCARDIOGRAM (TEE);  Surgeon: Larey Dresser, MD;  Location: [place] [facility];  Service: Cardiovascular;  Laterality: N/A;  . TEE WITHOUT CARDIOVERSION N/A 11/20/2017   Procedure: TRANSESOPHAGEAL ECHOCARDIOGRAM (TEE);  Surgeon: Larey Dresser, MD;  Location: [facility] ENDOSCOPY;  Service: Cardiovascular;  Laterality: N/A;  . WISDOM TOOTH EXTRACTION       OB History    None      Home Medications    Prior to Admission medications   Medication Sig Start Date End Date Taking? Authorizing Provider  acetaminophen (TYLENOL) 500 MG tablet Take 1,000 mg by mouth every 6 (six) hours as needed for moderate pain or headache.    [provider]  allopurinol (ZYLOPRIM) 300 MG tablet Take 300 mg by mouth daily.    [provider]  ALPRAZolam Duanne Moron) 0.25 MG tablet Take 1 tablet (0.25 mg total) by mouth every 12 (twelve) hours as needed for anxiety or sleep. Patient taking differently: Take 0.25 mg by mouth at bedtime.  07/24/16   Lauree Chandler, NP  aspirin 81 MG chewable tablet Chew 1 tablet (81 mg total) by mouth daily. 02/08/18   Shirley Friar, PA-C  atorvastatin (LIPITOR) 10 MG tablet Take 1 tablet (10 mg total) by mouth daily. 10/28/17   Larey Dresser, MD  Biotin 5000 MCG CAPS Take 5,000 mcg by mouth daily.     [provider]  gabapentin (NEURONTIN) 100  MG capsule Take 100 mg by mouth daily.    [provider]  hydroxychloroquine (PLAQUENIL) 200 MG tablet Take 200 mg by mouth 2 (two) times daily.    [provider]  metolazone (ZAROXOLYN) 2.5 MG tablet Take 1 tablet (2.5 mg total) by mouth 2 (two) times a week. 04/07/18   Larey Dresser, MD  metoprolol succinate (TOPROL-XL) 25 MG 24 hr tablet Take 1 tablet (25 mg total) by mouth daily. 03/25/18   Clegg, Amy D, NP  midodrine (PROAMATINE) 5 MG tablet Take 1 tablet (5 mg total) by mouth 2 (two) times daily with a meal. 04/02/18   Larey Dresser, MD  Multiple Vitamin (MULTIVITAMIN WITH MINERALS) TABS tablet Take 1 tablet by mouth daily. 02/08/18   Shirley Friar, PA-C  Omega-3 Fatty Acids (FISH OIL) 1200 MG CAPS Take 1,200 mg by mouth daily.    [provider]  OXYGEN Inhale 3 L into the lungs continuous.     [provider]  Polyethylene Glycol 400 (BLINK TEARS OP) Place 1-2 drops into both eyes at bedtime.    [provider]  potassium chloride SA (K-DUR,KLOR-CON) 20 MEQ tablet Take 2 tablets (40 mEq total) by mouth 2 (two) times daily. 03/17/18   Georgiana Shore, NP  Tiotropium Bromide-Olodaterol (STIOLTO RESPIMAT) 2.5-2.5 MCG/ACT AERS Inhale into the lungs.    [provider]  torsemide (DEMADEX) 20 MG tablet Take 5 tablets (100 mg total) by mouth 2 (two) times daily. 04/21/18   Larey Dresser, MD    Family History Family History  Problem Relation Age of Onset  . Heart attack Mother   . Prostate cancer Father   . Diabetes Brother   . Kidney failure Brother   . Breast cancer Maternal Aunt   . Colon cancer Neg Hx   . Stomach cancer Neg Hx     Social History Social History   Tobacco Use  . Smoking status: Former Smoker    Packs/day: 1.50    Years: 14.00    Pack years: 21.00    Types: Cigarettes    Last attempt to quit: 08/09/2003    Years since quitting: 14.7  . Smokeless tobacco: Never Used  Substance Use Topics  . Alcohol use: Yes    Alcohol/week: 0.0 standard drinks    Comment: 02/20/2016 "drank a little in my teens"  . Drug use: No     Allergies   Patient has no known allergies.   Review of Systems Review of Systems  Constitutional: Negative for fever.  HENT: Negative for sore throat.   Eyes: Negative for visual disturbance.  Respiratory: Negative for shortness of breath.   Cardiovascular: Negative for chest pain.  Gastrointestinal: Positive for abdominal pain and constipation. Negative for diarrhea, hematochezia and vomiting.  Genitourinary: Negative for dysuria.  Musculoskeletal: Negative for neck pain.  Skin: Negative for rash.  Neurological: Negative for headaches.     Physical Exam Updated Vital Signs BP (!) 77/51 (BP Location: Right Arm)   Pulse (!) 49   Temp 98.6 F (37 C) (Oral)   Resp (!) 25   Ht 5\' 4"  (1.626 m)   Wt 74.8 kg   SpO2 97%   BMI 28.32 kg/m   Physical Exam  Constitutional: She appears well-developed and well-nourished. No distress.   HENT:  Head: Normocephalic and atraumatic.  Eyes: Conjunctivae are normal.  Neck: Neck supple.  Cardiovascular: Normal rate and regular rhythm.  No murmur heard. Pulmonary/Chest: Effort normal and breath  sounds normal. No respiratory distress.  Abdominal: Soft. There is generalized tenderness.  She is had a peritoneal dialysis catheter intact in her left mid abdomen.  Genitourinary: Rectal exam shows guaiac negative stool.  Genitourinary Comments: She has normal sphincter tone and a lot of hard stool in the rectal vault.  This is manually disimpacted.  Musculoskeletal: She exhibits no edema.  Neurological: She is alert.  Skin: Skin is warm and dry.  Psychiatric: She has a normal mood and affect.  Nursing Mueller and vitals reviewed.    ED Treatments / Results  Labs (all labs ordered are listed, but only abnormal results are displayed) Labs Reviewed  COMPREHENSIVE METABOLIC PANEL - Abnormal; Notable for the following components:      Result Value   Glucose, Bld 104 (*)    BUN 109 (*)    Creatinine, Ser 6.56 (*)    Total Protein 5.7 (*)    Albumin 2.5 (*)    GFR calc non Af Amer 6 (*)    GFR calc Af Amer 7 (*)    All other components within normal limits  LIPASE, BLOOD - Abnormal; Notable for the following components:   Lipase 78 (*)    All other components within normal limits  CBC WITH DIFFERENTIAL/PLATELET - Abnormal; Notable for the following components:   RBC 2.76 (*)    Hemoglobin 8.0 (*)    HCT 28.2 (*)    MCV 102.2 (*)    MCHC 28.4 (*)    RDW 16.6 (*)    Platelets 105 (*)    All other components within normal limits  I-STAT CG4 LACTIC ACID, ED    EKG EKG Interpretation  Date/Time:  Saturday May 17 2018 19:17:06 EDT Ventricular Rate:  48 PR Interval:    QRS Duration: 104 QT Interval:  471 QTC Calculation: 421 R Axis:   -22 Text Interpretation:  Ectopic atrial bradycardia Borderline short PR interval Anterior infarct, old Abnrm T, consider ischemia,  anterolateral lds similar pattern to prior  Confirmed by Aletta Edouard [phone]) on 05/17/2018 7:36:05 PM   Radiology No results found.  Procedures Procedures (including critical care time)  Medications Ordered in ED Medications - No data to display   Initial Impression / Assessment and Plan / ED Course  I have reviewed the triage vital signs and the nursing notes.  Pertinent labs & imaging results that were available during my care of the patient were reviewed by me and considered in my medical decision making (see chart for details).  Clinical Course as of May 18 958  Sat May 17, 2018  1932 Discussed with Dr. Burnett Sheng from nephrology who will see if there is anybody available to take some fluid off the catheter.  As far as enemas he says you should not use a fleets enema out of bed anything else is okay.   [MB]  2116 Since the rectal exam patient has been passing copious amounts of stool.  She says her abdominal pain is improving.   [MB]  2148 Patient's pain is much improved after her moving her bowels.  Her lab work is been fairly unremarkable or at least at baseline for her priors.  Her lipase is slightly elevated.  I do not think she is showing evidence of SBP and she said her fluids been clear and so renal had given me the option if she were improved enough she does not necessarily need fluid taken off.   [MB]    Clinical Course User Index [MB]  Hayden Rasmussen, MD      Final Clinical Impressions(s) / ED Diagnoses   Final diagnoses:  Generalized abdominal pain  Constipation, unspecified constipation type    ED Discharge Orders    None       Hayden Rasmussen, MD 05/18/18 1000

## 2018-05-17 NOTE — Discharge Instructions (Addendum)
You were evaluated in the emergency department for 5 days of abdominal pain.  Your lab work did not show an obvious cause of your symptoms.  You had a rectal exam and it showed you had a lot of stool in your rectum.  We hope to pass some of this and you were able to move your bowels and you had a lot of improvement in your abdominal pain.  Will be important for you to follow-up with your doctor and return if any worsening symptoms.

## 2018-05-17 NOTE — ED Triage Notes (Signed)
Pt to ED from [facility] where she lives for rehab via EMS c/o constipation (last BM 10/7) and bp check because facility got 60/30. EMS got 72/54. A/O x4. Pulse in the 40s. Hx afib. Denies c/p, sob, fever. Started peritoneal dialysis pn 10/7 and sts has had abdominal pain since then and has told the staff but no one has listened. Wears 3L O2 at baseline.

## 2018-05-25 IMAGING — DX DG CHEST 2V
2 series · 2 of 2 positions shown · non-contrast
Comparison: 08/16/2015

CLINICAL DATA: Shortness breath, cough, weakness

EXAM:
CHEST  2 VIEW

[w chest pa]
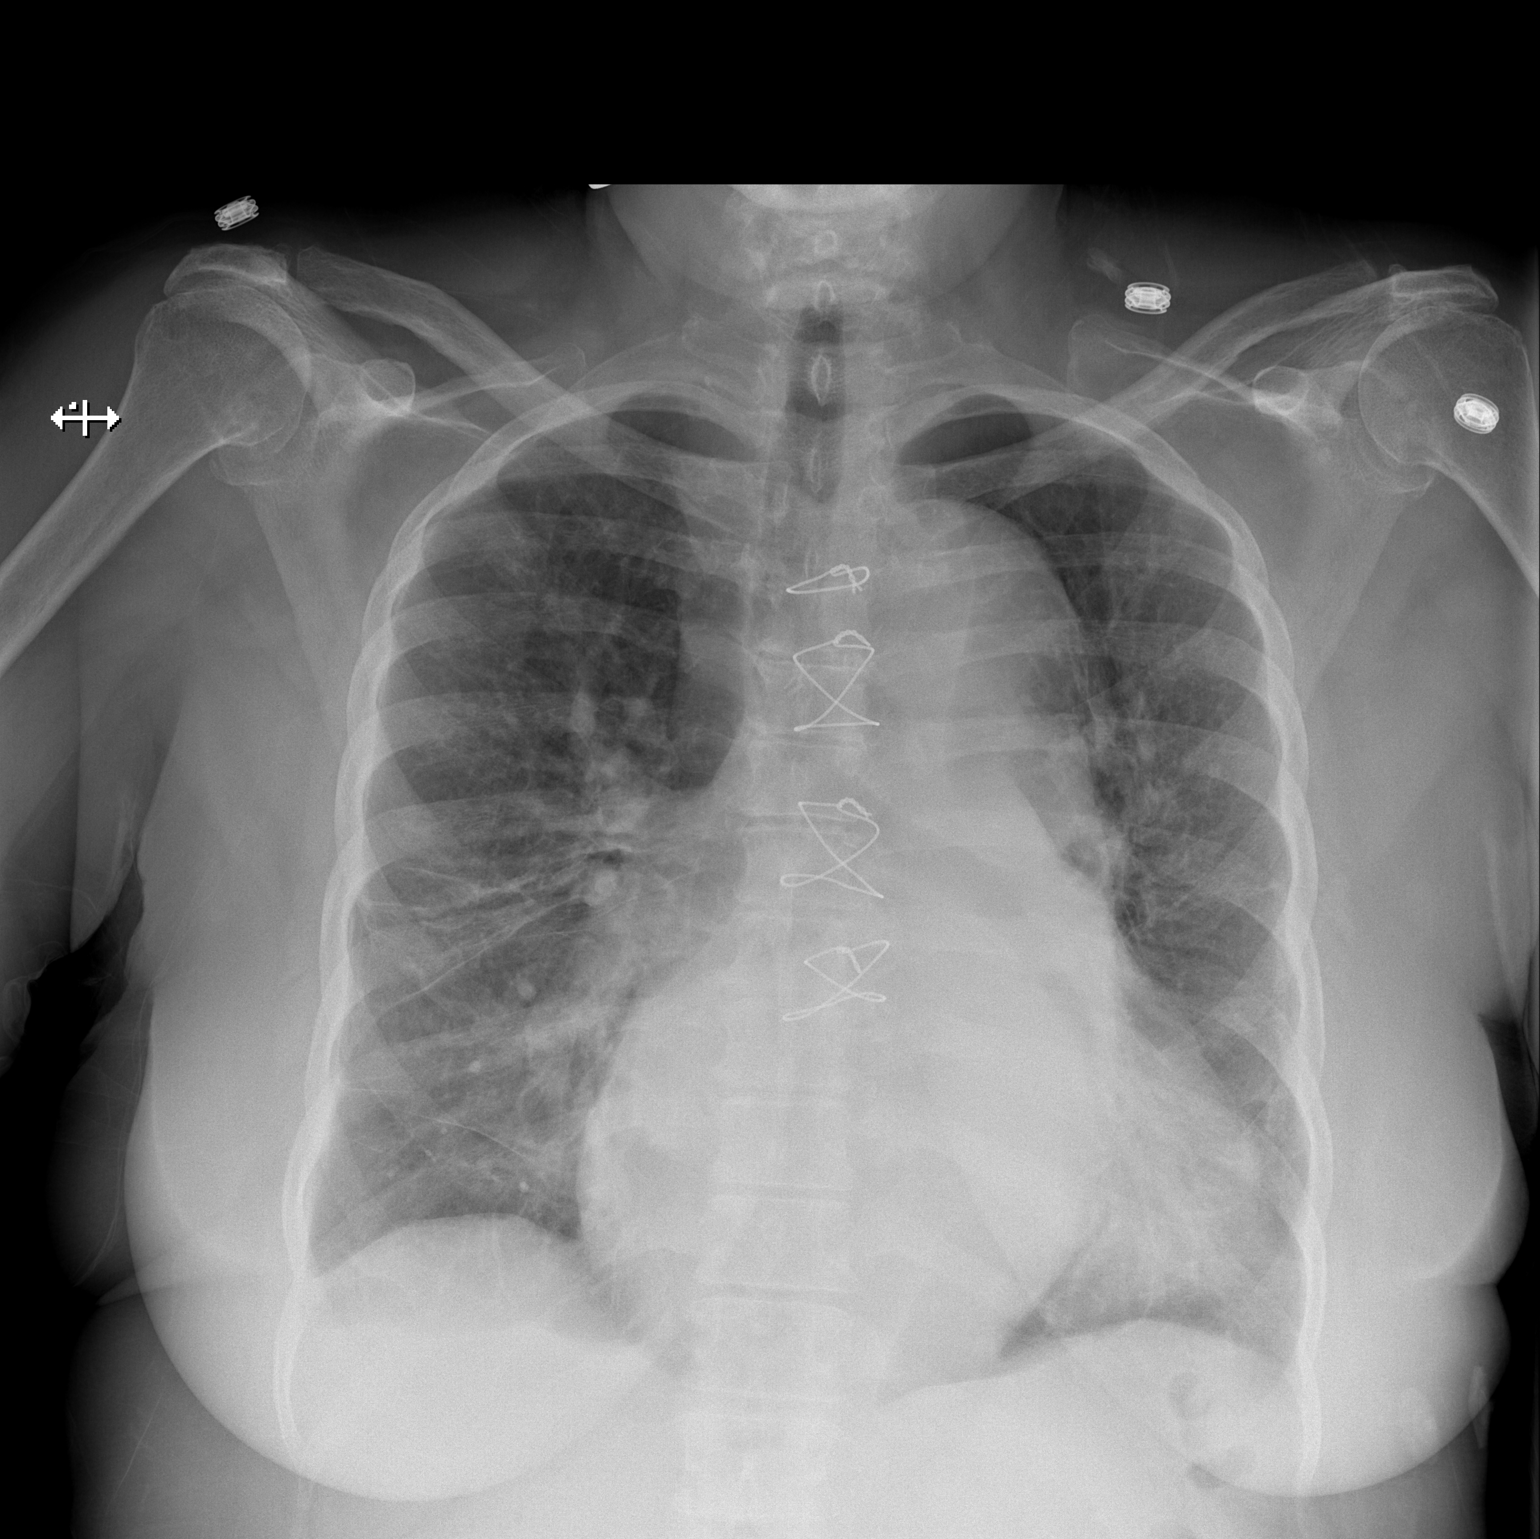

[w chest lat]
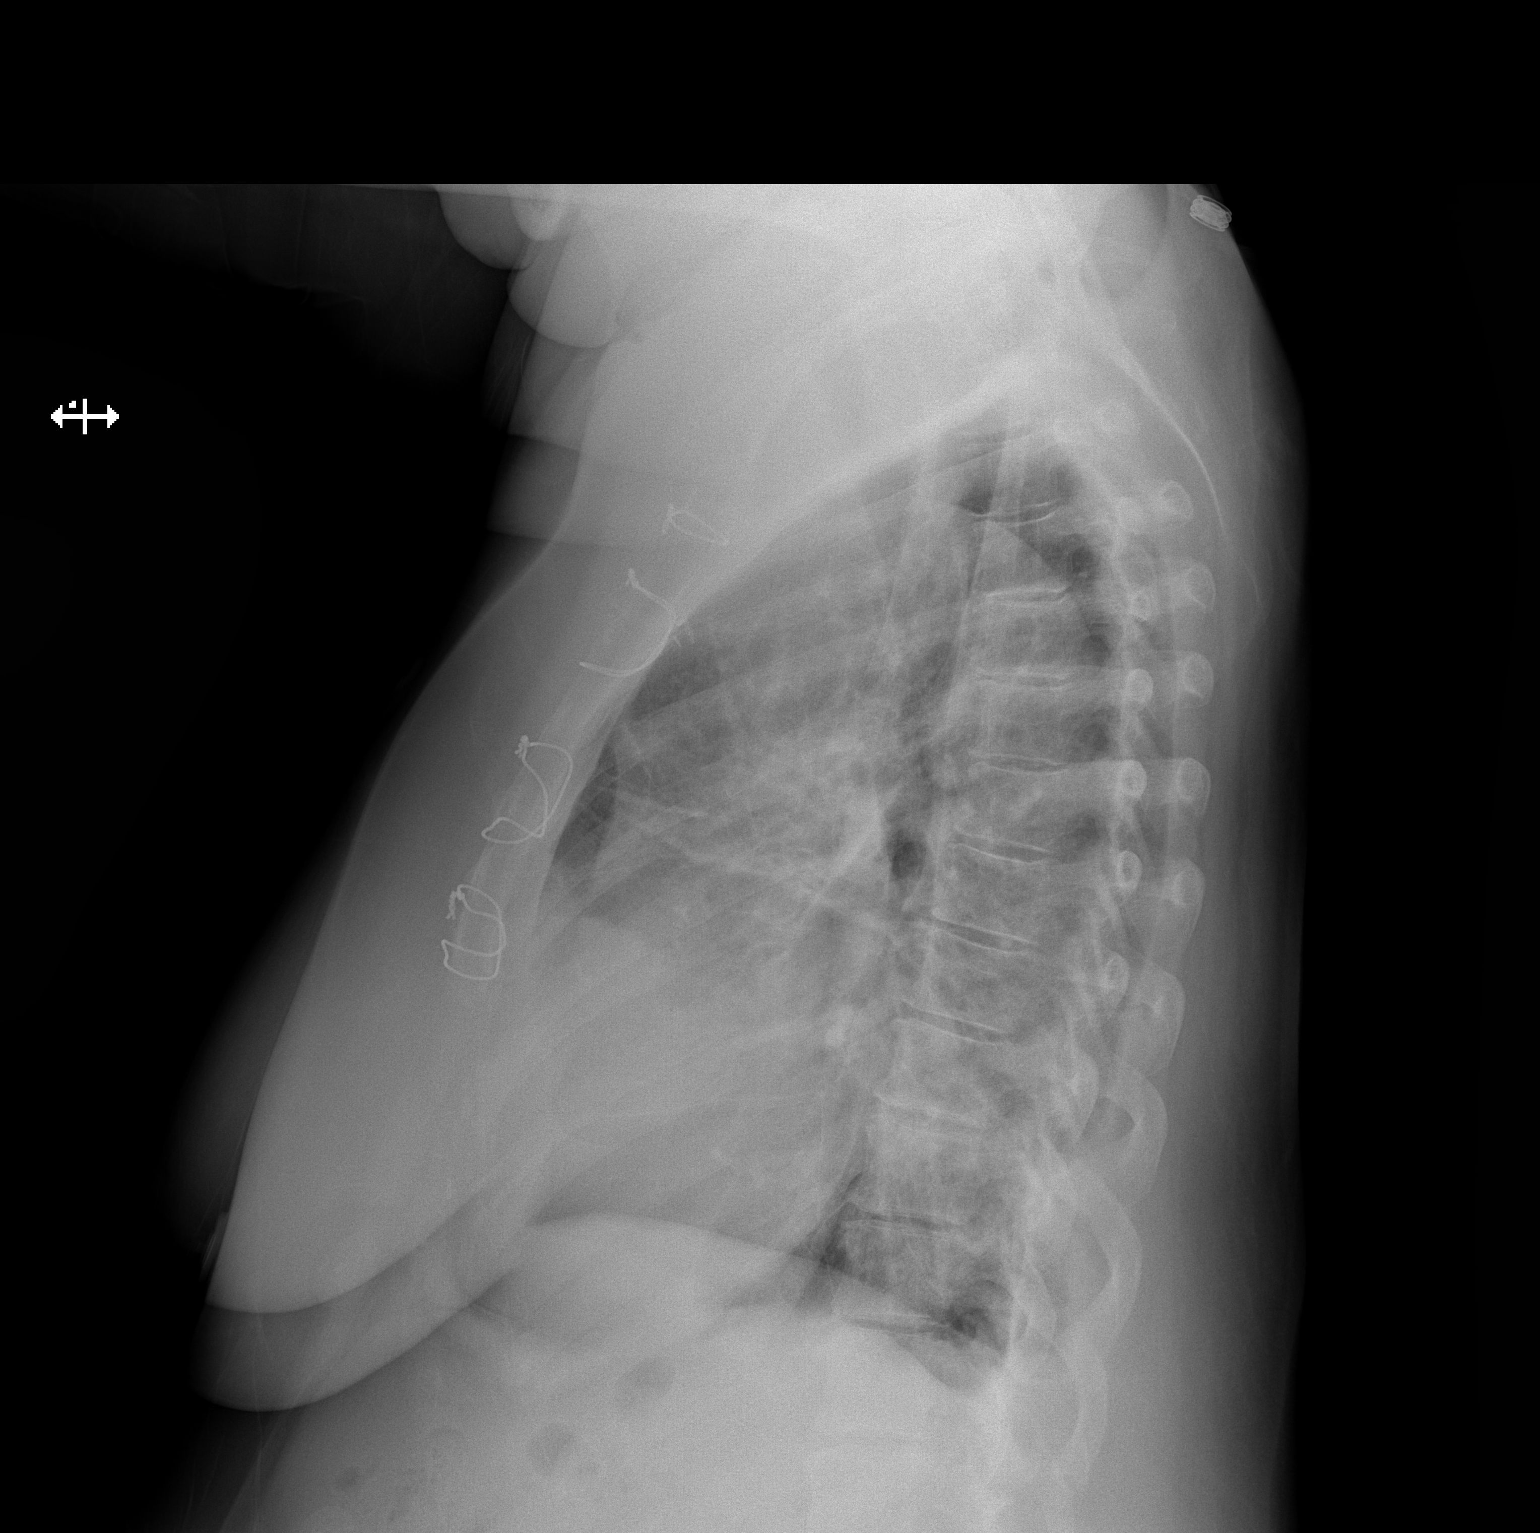

[2 of 2 positions shown; findings below may reference images not displayed]

FINDINGS: Chronic scarring in the right middle lung and bilateral lower lobes.
No focal consolidation. No pleural effusion or pneumothorax.

Cardiomegaly.

Mild degenerative changes of the visualized thoracolumbar spine.
Median sternotomy.
IMPRESSION: No evidence of acute cardiopulmonary disease.

## 2018-06-09 ENCOUNTER — Telehealth (HOSPITAL_COMMUNITY): Payer: Self-pay

## 2018-06-09 NOTE — Telephone Encounter (Signed)
Pt called stating she is on dialysis and her HR is 43. Please advise.

## 2018-06-09 NOTE — Telephone Encounter (Signed)
Stop metoprolol

## 2018-06-10 NOTE — Telephone Encounter (Signed)
Pt notified Verbalizes understanding 

## 2018-06-20 ENCOUNTER — Encounter (HOSPITAL_COMMUNITY): Payer: Self-pay | Admitting: *Deleted

## 2018-06-20 ENCOUNTER — Inpatient Hospital Stay (HOSPITAL_COMMUNITY)
Admission: EM | Admit: 2018-06-20 | Discharge: 2018-07-06 | DRG: 871 | Disposition: E | Payer: Medicare Other | Attending: Internal Medicine | Admitting: Internal Medicine

## 2018-06-20 ENCOUNTER — Emergency Department (HOSPITAL_COMMUNITY): Payer: Medicare Other

## 2018-06-20 ENCOUNTER — Other Ambulatory Visit: Payer: Self-pay

## 2018-06-20 DIAGNOSIS — G8929 Other chronic pain: Secondary | ICD-10-CM | POA: Diagnosis present

## 2018-06-20 DIAGNOSIS — I50810 Right heart failure, unspecified: Secondary | ICD-10-CM

## 2018-06-20 DIAGNOSIS — Z95818 Presence of other cardiac implants and grafts: Secondary | ICD-10-CM

## 2018-06-20 DIAGNOSIS — Z841 Family history of disorders of kidney and ureter: Secondary | ICD-10-CM

## 2018-06-20 DIAGNOSIS — I081 Rheumatic disorders of both mitral and tricuspid valves: Secondary | ICD-10-CM | POA: Diagnosis present

## 2018-06-20 DIAGNOSIS — F419 Anxiety disorder, unspecified: Secondary | ICD-10-CM | POA: Diagnosis present

## 2018-06-20 DIAGNOSIS — I132 Hypertensive heart and chronic kidney disease with heart failure and with stage 5 chronic kidney disease, or end stage renal disease: Secondary | ICD-10-CM | POA: Diagnosis present

## 2018-06-20 DIAGNOSIS — M069 Rheumatoid arthritis, unspecified: Secondary | ICD-10-CM | POA: Diagnosis present

## 2018-06-20 DIAGNOSIS — J449 Chronic obstructive pulmonary disease, unspecified: Secondary | ICD-10-CM | POA: Diagnosis present

## 2018-06-20 DIAGNOSIS — Z9981 Dependence on supplemental oxygen: Secondary | ICD-10-CM | POA: Diagnosis not present

## 2018-06-20 DIAGNOSIS — D6959 Other secondary thrombocytopenia: Secondary | ICD-10-CM | POA: Diagnosis present

## 2018-06-20 DIAGNOSIS — E43 Unspecified severe protein-calorie malnutrition: Secondary | ICD-10-CM | POA: Diagnosis present

## 2018-06-20 DIAGNOSIS — J9621 Acute and chronic respiratory failure with hypoxia: Secondary | ICD-10-CM | POA: Diagnosis present

## 2018-06-20 DIAGNOSIS — A419 Sepsis, unspecified organism: Principal | ICD-10-CM | POA: Diagnosis present

## 2018-06-20 DIAGNOSIS — I5033 Acute on chronic diastolic (congestive) heart failure: Secondary | ICD-10-CM | POA: Diagnosis present

## 2018-06-20 DIAGNOSIS — R6521 Severe sepsis with septic shock: Secondary | ICD-10-CM | POA: Diagnosis not present

## 2018-06-20 DIAGNOSIS — I2729 Other secondary pulmonary hypertension: Secondary | ICD-10-CM | POA: Diagnosis present

## 2018-06-20 DIAGNOSIS — E876 Hypokalemia: Secondary | ICD-10-CM | POA: Diagnosis present

## 2018-06-20 DIAGNOSIS — I509 Heart failure, unspecified: Secondary | ICD-10-CM | POA: Diagnosis not present

## 2018-06-20 DIAGNOSIS — N2581 Secondary hyperparathyroidism of renal origin: Secondary | ICD-10-CM | POA: Diagnosis present

## 2018-06-20 DIAGNOSIS — Z66 Do not resuscitate: Secondary | ICD-10-CM | POA: Diagnosis present

## 2018-06-20 DIAGNOSIS — G4733 Obstructive sleep apnea (adult) (pediatric): Secondary | ICD-10-CM | POA: Diagnosis present

## 2018-06-20 DIAGNOSIS — Z6825 Body mass index (BMI) 25.0-25.9, adult: Secondary | ICD-10-CM

## 2018-06-20 DIAGNOSIS — R0602 Shortness of breath: Secondary | ICD-10-CM | POA: Diagnosis present

## 2018-06-20 DIAGNOSIS — Z992 Dependence on renal dialysis: Secondary | ICD-10-CM

## 2018-06-20 DIAGNOSIS — Q219 Congenital malformation of cardiac septum, unspecified: Secondary | ICD-10-CM

## 2018-06-20 DIAGNOSIS — E611 Iron deficiency: Secondary | ICD-10-CM | POA: Diagnosis present

## 2018-06-20 DIAGNOSIS — L89611 Pressure ulcer of right heel, stage 1: Secondary | ICD-10-CM | POA: Diagnosis present

## 2018-06-20 DIAGNOSIS — D6489 Other specified anemias: Secondary | ICD-10-CM | POA: Diagnosis present

## 2018-06-20 DIAGNOSIS — R570 Cardiogenic shock: Secondary | ICD-10-CM | POA: Diagnosis present

## 2018-06-20 DIAGNOSIS — L8990 Pressure ulcer of unspecified site, unspecified stage: Secondary | ICD-10-CM

## 2018-06-20 DIAGNOSIS — I248 Other forms of acute ischemic heart disease: Secondary | ICD-10-CM | POA: Diagnosis present

## 2018-06-20 DIAGNOSIS — Z79899 Other long term (current) drug therapy: Secondary | ICD-10-CM

## 2018-06-20 DIAGNOSIS — I7101 Dissection of thoracic aorta: Secondary | ICD-10-CM | POA: Diagnosis present

## 2018-06-20 DIAGNOSIS — N186 End stage renal disease: Secondary | ICD-10-CM | POA: Diagnosis present

## 2018-06-20 DIAGNOSIS — E785 Hyperlipidemia, unspecified: Secondary | ICD-10-CM | POA: Diagnosis present

## 2018-06-20 DIAGNOSIS — E872 Acidosis: Secondary | ICD-10-CM | POA: Diagnosis present

## 2018-06-20 DIAGNOSIS — Z961 Presence of intraocular lens: Secondary | ICD-10-CM | POA: Diagnosis present

## 2018-06-20 DIAGNOSIS — Z9842 Cataract extraction status, left eye: Secondary | ICD-10-CM

## 2018-06-20 DIAGNOSIS — N19 Unspecified kidney failure: Secondary | ICD-10-CM

## 2018-06-20 DIAGNOSIS — Z8679 Personal history of other diseases of the circulatory system: Secondary | ICD-10-CM

## 2018-06-20 DIAGNOSIS — D619 Aplastic anemia, unspecified: Secondary | ICD-10-CM | POA: Diagnosis present

## 2018-06-20 DIAGNOSIS — I5082 Biventricular heart failure: Secondary | ICD-10-CM | POA: Diagnosis present

## 2018-06-20 DIAGNOSIS — Z9841 Cataract extraction status, right eye: Secondary | ICD-10-CM

## 2018-06-20 DIAGNOSIS — M109 Gout, unspecified: Secondary | ICD-10-CM | POA: Diagnosis present

## 2018-06-20 DIAGNOSIS — M545 Low back pain: Secondary | ICD-10-CM | POA: Diagnosis present

## 2018-06-20 DIAGNOSIS — I253 Aneurysm of heart: Secondary | ICD-10-CM | POA: Diagnosis present

## 2018-06-20 DIAGNOSIS — L89621 Pressure ulcer of left heel, stage 1: Secondary | ICD-10-CM | POA: Diagnosis present

## 2018-06-20 DIAGNOSIS — D631 Anemia in chronic kidney disease: Secondary | ICD-10-CM | POA: Diagnosis present

## 2018-06-20 DIAGNOSIS — I9589 Other hypotension: Secondary | ICD-10-CM

## 2018-06-20 DIAGNOSIS — I482 Chronic atrial fibrillation, unspecified: Secondary | ICD-10-CM | POA: Diagnosis present

## 2018-06-20 DIAGNOSIS — Z87891 Personal history of nicotine dependence: Secondary | ICD-10-CM

## 2018-06-20 DIAGNOSIS — Z7982 Long term (current) use of aspirin: Secondary | ICD-10-CM

## 2018-06-20 DIAGNOSIS — Z8249 Family history of ischemic heart disease and other diseases of the circulatory system: Secondary | ICD-10-CM

## 2018-06-20 DIAGNOSIS — I1310 Hypertensive heart and chronic kidney disease without heart failure, with stage 1 through stage 4 chronic kidney disease, or unspecified chronic kidney disease: Secondary | ICD-10-CM

## 2018-06-20 DIAGNOSIS — I429 Cardiomyopathy, unspecified: Secondary | ICD-10-CM | POA: Diagnosis present

## 2018-06-20 DIAGNOSIS — Z515 Encounter for palliative care: Secondary | ICD-10-CM | POA: Diagnosis present

## 2018-06-20 LAB — COMPREHENSIVE METABOLIC PANEL
ALT: 22 U/L (ref 0–44)
AST: 37 U/L (ref 15–41)
Albumin: 1.9 g/dL — ABNORMAL LOW (ref 3.5–5.0)
Alkaline Phosphatase: 79 U/L (ref 38–126)
Anion gap: 17 — ABNORMAL HIGH (ref 5–15)
BUN: 47 mg/dL — ABNORMAL HIGH (ref 8–23)
CHLORIDE: 94 mmol/L — AB (ref 98–111)
CO2: 25 mmol/L (ref 22–32)
CREATININE: 6.48 mg/dL — AB (ref 0.44–1.00)
Calcium: 9.4 mg/dL (ref 8.9–10.3)
GFR, EST AFRICAN AMERICAN: 7 mL/min — AB (ref >60)
GFR, EST NON AFRICAN AMERICAN: 6 mL/min — AB (ref >60)
Glucose, Bld: 150 mg/dL — ABNORMAL HIGH (ref 70–99)
POTASSIUM: 2.6 mmol/L — AB (ref 3.5–5.1)
SODIUM: 136 mmol/L (ref 135–145)
Total Bilirubin: 0.8 mg/dL (ref 0.3–1.2)
Total Protein: 5.8 g/dL — ABNORMAL LOW (ref 6.5–8.1)

## 2018-06-20 LAB — I-STAT VENOUS BLOOD GAS, ED
Acid-Base Excess: 4 mmol/L — ABNORMAL HIGH (ref 0.0–2.0)
BICARBONATE: 28.3 mmol/L — AB (ref 20.0–28.0)
O2 Saturation: 86 %
PCO2 VEN: 42 mmHg — AB (ref 44.0–60.0)
PO2 VEN: 50 mmHg — AB (ref 32.0–45.0)
TCO2: 30 mmol/L (ref 22–32)
pH, Ven: 7.436 — ABNORMAL HIGH (ref 7.250–7.430)

## 2018-06-20 LAB — CBC WITH DIFFERENTIAL/PLATELET
ABS IMMATURE GRANULOCYTES: 0.12 10*3/uL — AB (ref 0.00–0.07)
BASOS ABS: 0 10*3/uL (ref 0.0–0.1)
Basophils Relative: 0 %
Eosinophils Absolute: 0 10*3/uL (ref 0.0–0.5)
Eosinophils Relative: 0 %
HCT: 36 % (ref 36.0–46.0)
Hemoglobin: 10.6 g/dL — ABNORMAL LOW (ref 12.0–15.0)
Immature Granulocytes: 1 %
LYMPHS ABS: 0.2 10*3/uL — AB (ref 0.7–4.0)
LYMPHS PCT: 1 %
MCH: 27.6 pg (ref 26.0–34.0)
MCHC: 29.4 g/dL — ABNORMAL LOW (ref 30.0–36.0)
MCV: 93.8 fL (ref 80.0–100.0)
MONO ABS: 0.4 10*3/uL (ref 0.1–1.0)
Monocytes Relative: 3 %
NEUTROS PCT: 95 %
Neutro Abs: 10.8 10*3/uL — ABNORMAL HIGH (ref 1.7–7.7)
PLATELETS: UNDETERMINED 10*3/uL (ref 150–400)
RBC: 3.84 MIL/uL — ABNORMAL LOW (ref 3.87–5.11)
RDW: 14.7 % (ref 11.5–15.5)
WBC: 11.5 10*3/uL — ABNORMAL HIGH (ref 4.0–10.5)
nRBC: 0.3 % — ABNORMAL HIGH (ref 0.0–0.2)

## 2018-06-20 LAB — TYPE AND SCREEN
ABO/RH(D): O POS
ANTIBODY SCREEN: NEGATIVE

## 2018-06-20 LAB — I-STAT TROPONIN, ED: Troponin i, poc: 0.6 ng/mL (ref 0.00–0.08)

## 2018-06-20 LAB — BRAIN NATRIURETIC PEPTIDE: B NATRIURETIC PEPTIDE 5: 761.4 pg/mL — AB (ref 0.0–100.0)

## 2018-06-20 LAB — LIPASE, BLOOD: LIPASE: 22 U/L (ref 11–51)

## 2018-06-20 LAB — GLUCOSE, CAPILLARY: Glucose-Capillary: 105 mg/dL — ABNORMAL HIGH (ref 70–99)

## 2018-06-20 LAB — I-STAT CHEM 8, ED
BUN: 68 mg/dL — ABNORMAL HIGH (ref 8–23)
CALCIUM ION: 1.06 mmol/L — AB (ref 1.15–1.40)
Chloride: 94 mmol/L — ABNORMAL LOW (ref 98–111)
Creatinine, Ser: 6.1 mg/dL — ABNORMAL HIGH (ref 0.44–1.00)
GLUCOSE: 150 mg/dL — AB (ref 70–99)
HEMATOCRIT: 38 % (ref 36.0–46.0)
HEMOGLOBIN: 12.9 g/dL (ref 12.0–15.0)
Potassium: 3 mmol/L — ABNORMAL LOW (ref 3.5–5.1)
Sodium: 133 mmol/L — ABNORMAL LOW (ref 135–145)
TCO2: 30 mmol/L (ref 22–32)

## 2018-06-20 LAB — PROTIME-INR
INR: 1.13
PROTHROMBIN TIME: 14.4 s (ref 11.4–15.2)

## 2018-06-20 LAB — MRSA PCR SCREENING: MRSA by PCR: POSITIVE — AB

## 2018-06-20 LAB — I-STAT CG4 LACTIC ACID, ED
Lactic Acid, Venous: 5 mmol/L (ref 0.5–1.9)
Lactic Acid, Venous: 2.48 mmol/L (ref 0.5–1.9)

## 2018-06-20 MED ORDER — VANCOMYCIN HCL IN DEXTROSE 1-5 GM/200ML-% IV SOLN
1000.0000 mg | Freq: Once | INTRAVENOUS | Status: AC
  Administered 2018-06-20: 1000 mg via INTRAVENOUS
  Filled 2018-06-20: qty 200

## 2018-06-20 MED ORDER — HEPARIN 1000 UNIT/ML FOR PERITONEAL DIALYSIS
INTRAPERITONEAL | Status: DC | PRN
  Filled 2018-06-20: qty 3000

## 2018-06-20 MED ORDER — SODIUM CHLORIDE 0.9 % IV BOLUS
500.0000 mL | Freq: Once | INTRAVENOUS | Status: AC
  Administered 2018-06-20: 500 mL via INTRAVENOUS

## 2018-06-20 MED ORDER — HYDROMORPHONE HCL 1 MG/ML IJ SOLN
1.0000 mg | INTRAMUSCULAR | Status: DC | PRN
  Administered 2018-06-20 – 2018-06-24 (×8): 1 mg via INTRAVENOUS
  Filled 2018-06-20 (×8): qty 1

## 2018-06-20 MED ORDER — HEPARIN 1000 UNIT/ML FOR PERITONEAL DIALYSIS: 500.0000 [IU] | INTRAMUSCULAR | Status: DC | PRN

## 2018-06-20 MED ORDER — DELFLEX-LC/4.25% DEXTROSE 483 MOSM/L IP SOLN: INTRAPERITONEAL | Status: DC

## 2018-06-20 MED ORDER — SODIUM CHLORIDE 0.9 % IV SOLN
250.0000 mL | INTRAVENOUS | Status: DC
  Administered 2018-06-20 (×2): 250 mL via INTRAVENOUS

## 2018-06-20 MED ORDER — HEPARIN SODIUM (PORCINE) 5000 UNIT/ML IJ SOLN: 5000.0000 [IU] | Freq: Three times a day (TID) | INTRAMUSCULAR | Status: DC

## 2018-06-20 MED ORDER — GENTAMICIN SULFATE 0.1 % EX CREA
1.0000 "application " | TOPICAL_CREAM | Freq: Every day | CUTANEOUS | Status: DC
  Filled 2018-06-20: qty 15

## 2018-06-20 MED ORDER — PIPERACILLIN-TAZOBACTAM 3.375 G IVPB
3.3750 g | Freq: Two times a day (BID) | INTRAVENOUS | Status: DC
  Administered 2018-06-21: 3.375 g via INTRAVENOUS
  Filled 2018-06-20: qty 50

## 2018-06-20 MED ORDER — MIDODRINE HCL 5 MG PO TABS
10.0000 mg | ORAL_TABLET | Freq: Three times a day (TID) | ORAL | Status: DC
  Administered 2018-06-20 – 2018-06-21 (×2): 10 mg via ORAL
  Filled 2018-06-20 (×4): qty 2

## 2018-06-20 MED ORDER — NOREPINEPHRINE 4 MG/250ML-% IV SOLN
0.0000 ug/min | INTRAVENOUS | Status: DC
  Administered 2018-06-20: 2 ug/min via INTRAVENOUS
  Administered 2018-06-21: 6 ug/min via INTRAVENOUS
  Filled 2018-06-20: qty 250

## 2018-06-20 MED ORDER — SODIUM CHLORIDE 0.9 % IV BOLUS
500.0000 mL | Freq: Once | INTRAVENOUS | Status: AC
  Administered 2018-06-20: 13:00:00 via INTRAVENOUS

## 2018-06-20 MED ORDER — PIPERACILLIN-TAZOBACTAM 3.375 G IVPB 30 MIN
3.3750 g | Freq: Once | INTRAVENOUS | Status: AC
  Administered 2018-06-20: 3.375 g via INTRAVENOUS
  Filled 2018-06-20: qty 50

## 2018-06-20 NOTE — ED Notes (Signed)
Critical Care NP, Nephrology PA, and Dr. Darrick Penna is at bedside

## 2018-06-20 NOTE — ED Notes (Signed)
[place] NP okayed to start abx before obtaining PD fluid

## 2018-06-20 NOTE — H&P (Signed)
PULMONARY / CRITICAL CARE MEDICINE   NAME:  April Mueller, MRN:  [id number], DOB:  10-27-1945, LOS: 0 ADMISSION DATE:  06/19/2018, CONSULTATION DATE:  06/10/2018 REFERRING MD:  Dr. Vallery Ridge, CHIEF COMPLAINT:  shock  BRIEF HISTORY:    71 yoF with extensive hx on home PD x 1 week presenting with SOB and weakness, found hypotension with suspected septic shock +/- cardiogenic.   HISTORY OF PRESENT ILLNESS   72 year old female with extensive PMH as below, presenting from home with shortness of breath, weakness, and hypotension.  Patient is from home and states she lives alone.  Patient recently transitioned to home PD apparently one week ago and has been doing this herself. Apparently patient not candidate for iHD given her history of ongoing RV failure.  States that her legs have increasing swelling and it is hard for her to get around.  Reports that she puts in fluid but has not been getting the same fluid out. Her weight is up 30lbs.  Home health check came to check on her today and found her hypotensive and therefore sent to ER.  She complains of "feeling full", denies specifically any abd pain, known fever, states she's always cold, denies chest pain, nausea/ vomiting, reports chronic diarrhea but is taking laxative.    In ER, found to be hypotensive with systolic BP in 22-02'R, tachypneic in the 20's, rectal temp 99.2, with O2 saturations stable > 97% on 3L.  CXR negative.  Some labs still pending but noted for WBC 11.5, Hgb 10.6, platelet clumps noted, normal INR, lactic 5 ->2.48, troponin 0.60.  Given 1L NS bolus and started on peripheral levophed.  Patient has maintained her mental status and wants all aggressive measures and states she "not ready to go yet".  Blood cultures sent.  Unable to get PD fluid, but started on empiric vancomycin and cefepime.  HF team and nephrology consulted in ER.  PCCM to admit.  SIGNIFICANT PAST MEDICAL HISTORY   COPD on 3L home O2, Afib not on AC 2/2 GIB bleeding,  severe PAH w/prior PAP 74 mmHg on TTE 03/2018, rheumatoid arthritis, AAA s/p repair 2005, severe MR s/p mitral clip 11/2704, chronic diastolic HF, PAH, anemia, ESRD on home PD  SIGNIFICANT EVENTS:  11/15  Admit  STUDIES:    03/2018 TTE >> LV 60%, s/p mitraclip.  Severe biatrial enlargement, severe dilation of IVC and elevation of right sided filling pressures.  Septal flattening consistent with RV pressure and volume overload.  CULTURES:  11/15 BC x 2 >>  11/15 UC >> 11/15 PD fluid >>  ANTIBIOTICS:  11/15 vancomycin >> 11/15 cefepime x1 11/15 zosyn >>  LINES/TUBES:  PTA PD catheter PIV x 3  CONSULTANTS:  11/15 HF  11/15 Nephrology   SUBJECTIVE:   CONSTITUTIONAL: BP (!) 68/42   Pulse 61   Temp 99.2 F (37.3 C) (Rectal)   Resp (!) 27   SpO2 99%   No intake/output data recorded.      PHYSICAL EXAM: General:  Acutely on chronically ill elderly female in mild distress HEENT: MM pink/ dry, pupils 4/reactive, +JVD Neuro: Anxious, awake, oriented x 3, generalized weakness but MAE CV: rr, +murmur, weak peripheral pulses  PULM: tachypneic, CTA breath sounds  GI: protuberant, tender, extensive bruising to lower abd, distant BS, PD catheter site wnl Extremities: cool/dry, +4 LE edema to hip Skin: no rashes  RESOLVED PROBLEM LIST   ASSESSMENT AND PLAN   Shock- acute on chronic RV failure related to severe  PAH +/- component of septic P:  Admit to ICU  Start levophed for now, goal MAP > 31 Will place CVC now Appreciate HF team assistance  Trend trop and lactate   R/o sepsis P:  Continue vanc, broaden abd to zosyn for abd coverage Will need to send PD fluid for culture- unable to obtain this prior to abx Follow cultures, narrow as able  ESRD on PD  - not candidate for iHD given severe chronic RV failure P:   Appreciate Nephrology assistance Plans to attempt PD for fluid removal for now given hemodynamic instability and no acute indications at this time Trend  renal function   Acute on chronic hypoxic respiratory failure without COPD exacerbation on chronic 3L O2 and with severe PAH P:  continue supplemental O2 Could consider BiPAP prn for dyspnea, but this may decrease her preload and may worsen hemodynamics.     Afib - not on AC given prior GIB, currently rate controlled P:  monitor  Severe PAH, chronic diastolic HF, MR with prior mitral clip  P:  Per HF team  SUMMARY OF TODAY'S PLAN:  Admit to ICU for trial of vasopressors, antibotics, and fluid removal per PD.  If patient continues to decline will need to consider transition to comfort care.  Addendum 1648: Went in to consent patient for central line access. RN Shea Stakes at bedside for conversation.  After discussing procedure, patient decided that she did want any more procedures or life support if her heart and kidney failure are irreversible.  Stated she was "tired" and just wants to be comfortable. Her son is on the way from [place].  We will continue current therapies, abx, and current peripheral levophed at 4 mg with patient understanding risk in attempt to get family to bedside.  In the interval, if she was decompensate further, she states she would not want CPR or intubation, and at that time, we will make her comfortable.  I am attempting to notify Dr. Aundra Dubin.  Dr. Jonnie Finner is aware and with plans to drain PD in attempts to decrease dyspnea.  Will attempt to contact son and up date him on plan of care.    Best Practice / Goals of Care / Disposition.   DVT PROPHYLAXIS: SQ heparin SUP: n/a NUTRITION: npo MOBILITY: BR GOALS OF CARE: Full for now FAMILY DISCUSSIONS: ongoing, Dr. Lynetta Mare updated family  DISPOSITION ICU  LABS  Glucose No results for input(s): GLUCAP in the last 168 hours.  BMET Recent Labs  Lab 06/07/2018 1315  NA 133*  K 3.0*  CL 94*  BUN 68*  CREATININE 6.10*  GLUCOSE 150*    Liver Enzymes No results for input(s): AST, ALT, ALKPHOS, BILITOT,  ALBUMIN in the last 168 hours.  Electrolytes No results for input(s): CALCIUM, MG, PHOS in the last 168 hours.  CBC Recent Labs  Lab 07/02/2018 1307 06/22/2018 1315  WBC 11.5*  --   HGB 10.6* 12.9  HCT 36.0 38.0  PLT PLATELET CLUMPS NOTED ON SMEAR, UNABLE TO ESTIMATE  --     ABG No results for input(s): PHART, PCO2ART, PO2ART in the last 168 hours.  Coag's Recent Labs  Lab 06/15/2018 1307  INR 1.13    Sepsis Markers Recent Labs  Lab 07/03/2018 1316 07/01/2018 1448  LATICACIDVEN 5.00* 2.48*    Cardiac Enzymes No results for input(s): TROPONINI, PROBNP in the last 168 hours.  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  PAST SURGICAL HISTORY:  She  has a past surgical history that includes right heart catheterization (N/A, 08/18/2014); TEE without cardioversion (N/A, 01/05/2015); Wisdom tooth extraction; Givens capsule study (N/A, 08/21/2015); Esophagogastroduodenoscopy (N/A, 08/19/2015); Colonoscopy (N/A, 08/19/2015); Cardiac catheterization (N/A, 10/24/2015); Hemorrhoid surgery; Breast lumpectomy (Right); Cataract extraction w/ intraocular lens  implant, bilateral (Bilateral, 2016); enteroscopy (N/A, 02/22/2016); Breast excisional biopsy (Right); TEE without cardioversion (N/A, 11/20/2017); Repair of acute ascending thoracic aortic dissection (2005); and MITRAL VALVE REPAIR (N/A, 01/29/2018).  No Known Allergies  No current facility-administered medications on file prior to encounter.    Current  Outpatient Medications on File Prior to Encounter  Medication Sig  . acetaminophen (TYLENOL) 500 MG tablet Take 1,000 mg by mouth every 6 (six) hours as needed for moderate pain or headache.  . allopurinol (ZYLOPRIM) 300 MG tablet Take 300 mg by mouth daily.  [name] ALPRAZolam (XANAX) 0.25 MG tablet Take 1 tablet (0.25 mg total) by mouth every 12 (twelve) hours as needed for anxiety or sleep. (Patient taking differently: Take 0.25 mg by mouth at bedtime. )  . aspirin 81 MG chewable tablet Chew 1 tablet (81 mg total) by mouth daily.  [name] atorvastatin (LIPITOR) 10 MG tablet Take 1 tablet (10 mg total) by mouth daily.  . Biotin 5000 MCG CAPS Take 5,000 mcg by mouth daily.   [name] gabapentin (NEURONTIN) 100 MG capsule Take 100 mg by mouth daily.  . hydroxychloroquine (PLAQUENIL) 200 MG tablet Take 200 mg by mouth daily.   . metolazone (ZAROXOLYN) 2.5 MG tablet Take 1 tablet (2.5 mg total) by mouth 2 (two) times a week. (Patient taking differently: Take 2.5 mg by mouth daily. )  . metoprolol succinate (TOPROL-XL) 25 MG 24 hr tablet Take 1 tablet (25 mg total) by mouth daily.  . midodrine (PROAMATINE) 10 MG tablet Take 10 mg by mouth daily.  . midodrine (PROAMATINE) 5 MG tablet Take 1 tablet (5 mg total) by mouth 2 (two) times daily with a meal.  . Multiple Vitamin (MULTIVITAMIN WITH MINERALS) TABS tablet Take 1 tablet by mouth daily.  . Omega-3 Fatty Acids (FISH OIL) 1200 MG CAPS Take 1,200 mg by mouth daily.  . OXYGEN Inhale into the lungs continuous.   . Polyethylene Glycol 400 (BLINK TEARS OP) Place 1-2 drops into both eyes at bedtime.  . potassium chloride SA (K-DUR,KLOR-CON) 20 MEQ tablet Take 2 tablets (40 mEq total) by mouth 2 (two) times daily.  . Tiotropium Bromide-Olodaterol (STIOLTO RESPIMAT) 2.5-2.5 MCG/ACT AERS Inhale into the lungs.  . torsemide (DEMADEX) 20 MG tablet Take 5 tablets (100 mg total) by mouth 2 (two) times daily.    FAMILY HISTORY:   Her family history includes Breast cancer in  her maternal aunt; Diabetes in her brother; Heart attack in her mother; Kidney failure in her brother; Prostate cancer in her father. There is no history of Colon cancer or Stomach cancer.  SOCIAL HISTORY:  She  reports that she quit smoking about 14 years ago. Her smoking use included cigarettes. She has a 21.00 pack-year smoking history. She has never used smokeless tobacco. She reports that she drinks alcohol. She reports that she does not  use drugs.  REVIEW OF SYSTEMS:    Very limited due to dyspnea.  See HPI for further   Kennieth Rad, AGACNP-BC [facility] Pgr: [phone] or if no answer [phone] 07/03/2018, 4:22 PM

## 2018-06-20 NOTE — ED Triage Notes (Signed)
Per EMS, pt from home, has dialysis at home, pt [facility] nurse reports pt might not be doing it right.  Pt is A&Ox 4.  Pt is crying states "they said I'm gonna die if this doesn't work.  I don't wanna die yet!"  She is A&Ox 4.

## 2018-06-20 NOTE — ED Notes (Signed)
Elevated troponin of 0.60 reported to Dr. Colvin Caroli

## 2018-06-20 NOTE — Progress Notes (Signed)
RT assessed pt for possible Bipap.  RN at bedside prepping pt for transport to 2M04.  Pt on [place] 3 L, sat 99% and has no increased WOB.  RT did not place pt on Bipap at this time but will continue to monitor.

## 2018-06-20 NOTE — Progress Notes (Signed)
Pharmacy Antibiotic Note  April Mueller is a 72 y.o. female admitted on 06/15/2018 with sepsis.  Pharmacy has been consulted for Vancomycin / Zosyn  Dosing.  CCPD patient Vancomycin and Zosyn doses received in the ED   Plan: Vancomycin 1 gram iv X 1 dose now ( to make total loading dose of 2 grams) Zosyn 3.375 grams iv Q 12 hours Future vancomycin doses based on levels every 3 to 4 days Follow progress, cultures, antibiotic LOT     Temp (24hrs), Avg:99.2 F (37.3 C), Min:99.2 F (37.3 C), Max:99.2 F (37.3 C)  Recent Labs  Lab 06/18/2018 1307 06/14/2018 1315 06/27/2018 1316 06/28/2018 1448  WBC 11.5*  --   --   --   CREATININE 6.48* 6.10*  --   --   LATICACIDVEN  --   --  5.00* 2.48*    CrCl cannot be calculated (Unknown ideal weight.).    No Known Allergies   Thank you  Anette Guarneri, PharmD [phone]  06/15/2018 4:52 PM

## 2018-06-20 NOTE — ED Provider Notes (Signed)
[facility] EMERGENCY DEPARTMENT Provider Note   CSN: [id number] Arrival date & time: 06/09/2018  1236     History   Chief Complaint Chief Complaint  Patient presents with  . Hypotension    HPI April Mueller is a 72 y.o. female.  HPI Patient has complex medical history.  She does peritoneal dialysis at home.  Patient has severe cardiomyopathy.  She reports that she was told that her heart is not strong enough to do renal dialysis.  Patient reports that over the past week or more, despite trying to do the peritoneal dialysis, her legs are continuing to get more and more swollen.  She reports that getting harder for her to get around.  She reports that she is very short of breath with any activity.  She denies she has any focal areas of pain.  She denies headache, chest pain or abdominal pain.  She has not had a fever that she is aware of.  Her dialysis nurse came today and became concerned due to her persisting low blood pressure.  The patient reports that she has had very chronically low blood pressures typically ranging around the 80s to the 90s.  She reports sometimes they are down in the 60s but they do not usually persist in that range.  Today with the nurse checked the blood pressures were remaining closer to 60s.  Patient reports she generally feels very badly but does not have localizing symptoms except for the increasingly uncomfortable swelling in her legs. Past Medical History:  Diagnosis Date  . Anemia 03/23/15   transfusion  . Aortic aneurysm ([place])   . CHF (congestive heart failure) ([place])   . Chronic lower back pain   . Chronic thoracic aortic dissection (HCC)    S/P repair ascending thoracic aortic dissection with chronic residual dissection involving transverse aortic arch and descending thoracic aorta  . CKD (chronic kidney disease) stage 3, GFR 30-59 ml/min (HCC)    "on dialysis for 3 months after my aneurysm in 2005" (02/20/2016)  . COPD (chronic  obstructive pulmonary disease) ([place])   . Gout   . History of blood transfusion "several"  . Hypertension   . Lupus ([place])   . On home oxygen therapy    "3L; 24/7" (02/20/2016)  . OSA on CPAP   . Pulmonary HTN ([place])   . Rheumatoid arthritis(714.0) 11/06/2012  . S/P aortic dissection repair   . Tuberculosis    dormant carrier    Patient Active Problem List   Diagnosis Date Noted  . Septic shock ([place]) 06/19/2018  . AKI (acute kidney injury) ([place])   . Chronic atrial fibrillation   . Hypoxemia   . Acute respiratory failure with hypoxemia ([place])   . Respiratory failure ([place])   . Acute pulmonary edema (HCC)   . Acute on chronic systolic heart failure ([place]) 01/29/2018  . Severe mitral regurgitation 01/29/2018  . S/P aortic dissection repair   . Chronic thoracic aortic dissection (HCC)   . PAF (paroxysmal atrial fibrillation) ([place]) 11/06/2016  . Sepsis due to Escherichia coli ([place])   . Sepsis ([place]) 07/12/2016  . Thrombocytopenia ([place]) 07/12/2016  . Urinary tract infection with hematuria   . Fever   . Hypotension   . Iron deficiency anemia due to chronic blood loss 11/03/2015  . Chronic diastolic CHF (congestive heart failure) ([place]) 09/26/2015  . Persistent atrial fibrillation   . History of colonic polyps   . Benign neoplasm of cecum   . Benign neoplasm  of ascending colon   . Heme positive stool   . Anemia 08/17/2015  . Symptomatic anemia 08/16/2015  . HTN (hypertension) 08/16/2015  . Leukocytosis 08/16/2015  . Acute hyperkalemia 08/16/2015  . 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 Anemia of chronic disease 04/26/2015  . CKD (chronic kidney disease) stage 3, GFR 30-59 ml/min (HCC) 04/22/2015  . Normocytic anemia 04/04/2015  . Morbid obesity ([place]) 01/22/2015  . Mitral regurgitation 12/29/2014  . Dyspnea 08/18/2014  . Chronic respiratory failure with hypoxia ([place]) 08/09/2014  . 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 Rheumatoid arthritis ([place])  11/06/2012  . 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 OSA on CPAP 11/06/2012    Past Surgical History:  Procedure Laterality Date  . BREAST EXCISIONAL BIOPSY Right    benign  . BREAST LUMPECTOMY Right   . CARDIAC CATHETERIZATION N/A 10/24/2015   Procedure: Right Heart Cath;  Surgeon: Larey Dresser, MD;  Location: [facility] CV LAB;  Service: Cardiovascular;  Laterality: N/A;  . CATARACT EXTRACTION W/ INTRAOCULAR LENS  IMPLANT, BILATERAL Bilateral 2016  . COLONOSCOPY N/A 08/19/2015   Procedure: COLONOSCOPY;  Surgeon: Jerene Bears, MD;  Location: [facility] ENDOSCOPY;  Service: Endoscopy;  Laterality: N/A;  . ENTEROSCOPY N/A 02/22/2016   Procedure: ENTEROSCOPY;  Surgeon: Milus Banister, MD;  Location: [facility];  Service: Endoscopy;  Laterality: N/A;  . ESOPHAGOGASTRODUODENOSCOPY N/A 08/19/2015   Procedure: ESOPHAGOGASTRODUODENOSCOPY (EGD);  Surgeon: Jerene Bears, MD;  Location: [facility] ENDOSCOPY;  Service: Endoscopy;  Laterality: N/A;  patient scheduled, anesthesia aware of 1500 case, per [place].  08/18/15 DP  . GIVENS CAPSULE STUDY N/A 08/21/2015   Procedure: GIVENS CAPSULE STUDY;  Surgeon: Gatha Mayer, MD;  Location: [facility];  Service: Endoscopy;  Laterality: N/A;  . HEMORRHOID SURGERY    . MITRAL VALVE REPAIR N/A 01/29/2018   Procedure: MITRAL VALVE REPAIR;  Surgeon: Sherren Mocha, MD;  Location: [place] [facility] CV LAB;  Service: Cardiovascular;  Laterality: N/A;  . REPAIR OF ACUTE ASCENDING THORACIC AORTIC DISSECTION  2005  . RIGHT HEART CATHETERIZATION N/A 08/18/2014   Procedure: RIGHT HEART CATH;  Surgeon: Jolaine Artist, MD;  Location: [facility] CATH LAB;  Service: Cardiovascular;  Laterality: N/A;  . TEE WITHOUT CARDIOVERSION N/A 01/05/2015   Procedure: TRANSESOPHAGEAL ECHOCARDIOGRAM (TEE);  Surgeon: Larey Dresser, MD;  Location: [facility];  Service: Cardiovascular;  Laterality: N/A;  . TEE WITHOUT CARDIOVERSION N/A 11/20/2017   Procedure: TRANSESOPHAGEAL  ECHOCARDIOGRAM (TEE);  Surgeon: Larey Dresser, MD;  Location[facility];  Service: Cardiovascular;  Laterality: N/A;  . WISDOM TOOTH EXTRACTION       OB History   None      Home Medications    Prior to Admission medications   Medication Sig Start Date End Date Taking? Authorizing Provider  acetaminophen (TYLENOL) 500 MG tablet Take 1,000 mg by mouth every 6 (six) hours as needed for moderate pain or headache.    [provider]  allopurinol (ZYLOPRIM) 300 MG tablet Take 300 mg by mouth daily.    [provider]  ALPRAZolam Duanne Moron) 0.25 MG tablet Take 1 tablet (0.25 mg total) by mouth every 12 (twelve) hours as needed for anxiety or sleep. Patient taking differently: Take 0.25 mg by mouth at bedtime.  07/24/16   Lauree Chandler, NP  aspirin 81 MG chewable tablet Chew 1 tablet (81 mg total) by mouth daily. 02/08/18   Shirley Friar, PA-C  atorvastatin (LIPITOR) 10 MG tablet Take 1  tablet (10 mg total) by mouth daily. 10/28/17   Larey Dresser, MD  Biotin 5000 MCG CAPS Take 5,000 mcg by mouth daily.     [provider]  gabapentin (NEURONTIN) 100 MG capsule Take 100 mg by mouth daily.    [provider]  hydroxychloroquine (PLAQUENIL) 200 MG tablet Take 200 mg by mouth daily.     [provider]  metolazone (ZAROXOLYN) 2.5 MG tablet Take 1 tablet (2.5 mg total) by mouth 2 (two) times a week. Patient taking differently: Take 2.5 mg by mouth daily.  04/07/18   Larey Dresser, MD  metoprolol succinate (TOPROL-XL) 25 MG 24 hr tablet Take 1 tablet (25 mg total) by mouth daily. 03/25/18   Clegg, Amy D, NP  midodrine (PROAMATINE) 10 MG tablet Take 10 mg by mouth daily.    [provider]  midodrine (PROAMATINE) 5 MG tablet Take 1 tablet (5 mg total) by mouth 2 (two) times daily with a meal. 04/02/18   Larey Dresser, MD  Multiple Vitamin (MULTIVITAMIN WITH MINERALS) TABS tablet Take 1 tablet by mouth daily. 02/08/18   Shirley Friar, PA-C  Omega-3 Fatty Acids (FISH OIL) 1200 MG CAPS Take 1,200 mg by mouth daily.    [provider]  OXYGEN Inhale into the lungs continuous.     [provider]  Polyethylene Glycol 400 (BLINK TEARS OP) Place 1-2 drops into both eyes at bedtime.    [provider]  potassium chloride SA (K-DUR,KLOR-CON) 20 MEQ tablet Take 2 tablets (40 mEq total) by mouth 2 (two) times daily. 03/17/18   Georgiana Shore, NP  Tiotropium Bromide-Olodaterol (STIOLTO RESPIMAT) 2.5-2.5 MCG/ACT AERS Inhale into the lungs.    [provider]  torsemide (DEMADEX) 20 MG tablet Take 5 tablets (100 mg total) by mouth 2 (two) times daily. 04/21/18   Larey Dresser, MD    Family History Family History  Problem Relation Age of Onset  . Heart attack Mother   . Prostate cancer Father   . Diabetes Brother   . Kidney failure Brother   . Breast cancer Maternal Aunt   . Colon cancer Neg Hx   . Stomach cancer Neg Hx     Social History Social History   Tobacco Use  . Smoking status: Former Smoker    Packs/day: 1.50    Years: 14.00    Pack years: 21.00    Types: Cigarettes    Last attempt to quit: 08/09/2003    Years since quitting: 14.8  . Smokeless tobacco: Never Used  Substance Use Topics  . Alcohol use: Yes    Alcohol/week: 0.0 standard drinks    Comment: 02/20/2016 "drank a little in my teens"  . Drug use: No     Allergies   Patient has no known allergies.   Review of Systems Review of Systems 10 Systems reviewed and are negative for acute change except as noted in the HPI.   Physical Exam Updated Vital Signs BP (!) 60/40   Pulse 65   Temp 99.2 F (37.3 C) (Rectal)   Resp (!) 31   SpO2 97%   Physical Exam  Constitutional: She is oriented to person, place, and time.  Patient is pale in appearance.  She does not have shortness of breath at rest.  She appears very deconditioned and fatigued.  HENT:  Head: Normocephalic and atraumatic.    Mouth/Throat: Oropharynx is clear and moist.  Eyes: Pupils are equal, round, and reactive to  light. EOM are normal.  Neck: Neck supple. JVD present.  Cardiovascular:  Heart is irregularly irregular.  Rate is from 60s to 70s.  Near holosystolic murmur.  2-3+.  Pulmonary/Chest:  Patient does not have significant increased work of breathing at rest.  She is speaking in full sentences.  Breath sounds are very soft at the bases.  Occasional fine crackle.  No wheezing or coarse rales.  Abdominal:  Abdomen is soft and nondistended.  She has a PD catheter inserted through the left abdominal wall.  Musculoskeletal:  Both legs have 3+ pitting edema.  There appear to be a large bruises on the left medial upper leg.  No apparent deformity.  Feet are very edematous.  Feet feel slightly cool.  Neurological: She is alert and oriented to person, place, and time.  Patient is generally very weak.  She however does not appear to have localizing neurologic deficit.  She can use both upper extremities.  She is weak moving her legs but can assist somewhat to roll to her side for examination.  Skin: Skin is warm and dry. There is pallor.  Patient has some mild breakdown on the buttocks.  See attached images.           ED Treatments / Results  Labs (all labs ordered are listed, but only abnormal results are displayed) Labs Reviewed  CBC WITH DIFFERENTIAL/PLATELET - Abnormal; Notable for the following components:      Result Value   WBC 11.5 (*)    RBC 3.84 (*)    Hemoglobin 10.6 (*)    MCHC 29.4 (*)    nRBC 0.3 (*)    Neutro Abs 10.8 (*)    Lymphs Abs 0.2 (*)    Abs Immature Granulocytes 0.12 (*)    All other components within normal limits  I-STAT CHEM 8, ED - Abnormal; Notable for the following components:   Sodium 133 (*)    Potassium 3.0 (*)    Chloride 94 (*)    BUN 68 (*)    Creatinine, Ser 6.10 (*)    Glucose, Bld 150 (*)    Calcium, Ion 1.06 (*)    All other components within normal  limits  I-STAT CG4 LACTIC ACID, ED - Abnormal; Notable for the following components:   Lactic Acid, Venous 5.00 (*)    All other components within normal limits  I-STAT TROPONIN, ED - Abnormal; Notable for the following components:   Troponin i, poc 0.60 (*)    All other components within normal limits  I-STAT VENOUS BLOOD GAS, ED - Abnormal; Notable for the following components:   pH, Ven 7.436 (*)    pCO2, Ven 42.0 (*)    pO2, Ven 50.0 (*)    Bicarbonate 28.3 (*)    Acid-Base Excess 4.0 (*)    All other components within normal limits  I-STAT CG4 LACTIC ACID, ED - Abnormal; Notable for the following components:   Lactic Acid, Venous 2.48 (*)    All other components within normal limits  URINE CULTURE  CULTURE, BLOOD (ROUTINE X 2)  CULTURE, BLOOD (ROUTINE X 2)  BODY FLUID CULTURE  PROTIME-INR  COMPREHENSIVE METABOLIC PANEL  LIPASE, BLOOD  BRAIN NATRIURETIC PEPTIDE  URINALYSIS, ROUTINE W REFLEX MICROSCOPIC  BLOOD GAS, VENOUS  HIV ANTIBODY (ROUTINE TESTING W REFLEX)  TROPONIN I  TROPONIN I  LACTIC ACID, PLASMA  LACTIC ACID, PLASMA  PROCALCITONIN  TYPE AND SCREEN    EKG EKG Interpretation  Date/Time:  Friday June 20 2018 12:57:16  EST Ventricular Rate:  60 PR Interval:    QRS Duration: 227 QT Interval:  383 QTC Calculation: 386 R Axis:   66 Text Interpretation:  Atrial fibrillation artifact no appaent change from previous Confirmed by Charlesetta Shanks [phone]) on 06/10/2018 3:20:28 PM   Radiology Dg Chest Port 1 View  Result Date: 06/07/2018 CLINICAL DATA:  Shortness of breath. EXAM: PORTABLE CHEST 1 VIEW COMPARISON:  02/06/2018 and 02/02/2018 and 01/27/2018 FINDINGS: There is chronic marked cardiomegaly. Pulmonary vascularity is within normal limits. Slight chronic thickening of the fissures on the right. No discrete infiltrates or effusions. No acute bone abnormality. Chronic degenerative changes of both shoulders. IMPRESSION: No acute abnormality.  Chronic  cardiomegaly. Electronically Signed   By: Lorriane Shire M.D.   On: 06/29/2018 13:26    Procedures Procedures (including critical care time) CRITICAL CARE Performed by: Charlesetta Shanks   Total critical care time:60 minutes  Critical care time was exclusive of separately billable procedures and treating other patients.  Critical care was necessary to treat or prevent imminent or life-threatening deterioration.  Critical care was time spent personally by me on the following activities: development of treatment plan with patient and/or surrogate as well as nursing, discussions with consultants, evaluation of patient's response to treatment, examination of patient, obtaining history from patient or surrogate, ordering and performing treatments and interventions, ordering and review of laboratory studies, ordering and review of radiographic studies, pulse oximetry and re-evaluation of patient's condition. Medications Ordered in ED Medications  norepinephrine (LEVOPHED) 4mg  in D5W 248mL premix infusion (2 mcg/min Intravenous New Bag/Given 07/01/2018 1505)  piperacillin-tazobactam (ZOSYN) IVPB 3.375 g (3.375 g Intravenous New Bag/Given 06/19/2018 1501)  vancomycin (VANCOCIN) IVPB 1000 mg/200 mL premix (1,000 mg Intravenous New Bag/Given 07/04/2018 1459)  heparin injection 5,000 Units (has no administration in time range)  midodrine (PROAMATINE) tablet 10 mg (has no administration in time range)  sodium chloride 0.9 % bolus 500 mL (0 mLs Intravenous Stopped 06/19/2018 1420)  sodium chloride 0.9 % bolus 500 mL (0 mLs Intravenous Stopped 06/19/2018 1421)     Initial Impression / Assessment and Plan / ED Course  I have reviewed the triage vital signs and the nursing notes.  Pertinent labs & imaging results that were available during my care of the patient were reviewed by me and considered in my medical decision making (see chart for details).  Clinical Course as of Jun 20 1520  Fri Jun 20, 2018  1346  Consult to nephrology and cardiology ordered.   [MP]  1410 Consult: Reviewed with Dr. Landry Mellow did not of nephrology.  Agrees with initiating Levophed after 1 L of fluids.  No additional fluids at this time.  Will see the patient in consultation in the emergency department.Consult: Reviewed with cardiology.  Will do consultation in the emergency department.   [MP]  1411 .   [MP]  1412 Consult: Reviewed with intensivist for admission.  Advises we need peritoneal fluid dialysate culture.  Try to obtain before initiating empiric antibiotics.  Will do consult in emergency department.   [MP]    Clinical Course User Index [MP] Charlesetta Shanks, MD   Patient presents with severe chronic illness.  Patient has been declining while trying to do home peritoneal dialysis in the setting of severe cardiac dysfunction due to cardiomyopathy.  She has not had fever or apparent source of infection.  She certainly has risk of intercurrent infection.  Have reviewed with intensivist and plan to get PD specimen before starting empiric antibiotics.  Clinically,  patient has worsening heart failure with a combination of intravascular depletion but overall volume overload.  Pressures were treated initially with volume resuscitation to 1 L.  At that point ordered Levophed.  EMR review shows Levophed has been used previously for this patient with severe recurrent hypotension in the setting of volume overload.  Reviewed this with nephrology who will also evaluate the patient for additional management suggestions.  Cardiology has been consulted to try to assist with severe, chronic heart failure.  Patient will be admitted to critical care team.  Although the patient is severely and critically ill, her mental status remains alert and appropriate.  She is not showing signs of confusion.  Up to this point, her respiratory status has remained stable but, given her severe underlying disease patient is at high risk of respiratory failure which  may be precipitous.   Final Clinical Impressions(s) / ED Diagnoses   Final diagnoses:  Acute on chronic congestive heart failure, unspecified heart failure type ([place])  Renal failure treated with peritoneal dialysis ([place])  Other specified hypotension  Cardiorenal syndrome with renal failure, stage 5 chronic kidney disease or end stage renal disease, with heart failure [facility])    ED Discharge Orders    None       Charlesetta Shanks, MD 06/27/2018 1525

## 2018-06-20 NOTE — ED Notes (Addendum)
Neph PA is at bedside

## 2018-06-20 NOTE — ED Notes (Signed)
CG-4  Of 5.00 reported to Dr. Colvin Caroli

## 2018-06-20 NOTE — Consult Note (Addendum)
Advanced Heart Failure Team Consult Note   Primary Physician: Lawerance Cruel, MD PCP-Cardiologist:  No primary care provider on file.  Reason for Consultation: A/C diastolic HF  HPI:    April Mueller is seen today for evaluation of A/C diastolic HF patient at the request of Dr Johnney Killian.   April Mueller is a 72 y.o. with COPD on home oxygen, rheumatoid arthritis, chronic atrial fibrillation off AC due to GIB, AAA repair 2005, severe MR s/p mitra clip 11/971, chronic diastolic CHF, PAH, anemia, and ESRD on PD.    Please see Dr Claris Gladden note from 04/01/18 for more detailed history.   In 6/19, she was admitted for successful Mitraclip placement.  She was treated for CHF in the hospital for several days after this, then discharged to SNF.  Most recent echo in 8/19 shows EF 60-65%, Mitraclip with mild-moderate MR, probably no significant stenosis.  There is severe pulmonary hypertension, dilated RV, and severe TR.  Significant RV failure.   She was last seen in HF clinic 04/01/18. She was having occasional lightheadedness, but orthostatics were negative.   She started peritoneal dialysis at the beginning of October.  She was seen in ED on 05/17/18 with abdominal pain. SBP 60s. Symptoms resolved with BM.   On 11/4, she called HF clinic with HR 43 and metoprolol was stopped.   She presented to [facility] today with progressive SOB. She has felt badly since starting PD. She was unable to try iHD due to soft BPs. Weights have slowly trended up, now up 30 lbs. She is anuric and is not getting much off fluid off with PD. She has indwelling PD fluid currently. Denies any fever or cough. She is always cold. No CP. Some lightheadedness. She feels like she can't think clearly. She is tearful and says that she "needs more time." She is dyspneic at rest, so ROS limited. Her son should be coming soon.  Pertinent admission labs include: Na 133, K 3.0, creatinine 6.10, troponin 0.60, hemoglobin 12.9,  lactic acid 5.0, WBC 11.5 CXR: No acute abnormality.  Chronic cardiomegaly.  She has been started on Vanc and Cefepime. Received 500 ml NS bolus x2. Norepi drip ordered. SBP remains 60s.  Echo 03/2018: - Left ventricle: The cavity size was normal. Systolic function was   normal. The estimated ejection fraction was in the range of 60%   to 65%. Wall motion was normal; there were no regional wall   motion abnormalities. Doppler parameters are consistent with a   reversible restrictive pattern, indicative of decreased left   ventricular diastolic compliance and/or increased left atrial   pressure (grade 3 diastolic dysfunction). - Ventricular septum: Septal motion showed abnormal function and   dyssynergy. The contour showed diastolic flattening. These   changes are consistent with RV volume and pressure overload. - Aortic valve: There was mild stenosis. There was mild   regurgitation. - Mitral valve: There was mild to moderate regurgitation. Mean   gradient (D): 6 mm Hg. Peak gradient (D): 13 mm Hg. Valve area by   pressure half-time: 2.1 cm^2. - Left atrium: The atrium was severely dilated. - Right ventricle: The cavity size was severely dilated. Wall   thickness was normal. - Right atrium: The atrium was severely dilated. - Atrial septum: A septal defect cannot be excluded. 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 - Tricuspid valve: There was severe regurgitation. Peak RV-RA   gradient (S):  59 mm Hg. - Pulmonic valve: There was mild regurgitation. Regurgitant   gradient (ED): 8 mm Hg. - Pulmonary arteries: Systolic pressure was severely increased. PA   peak pressure: 74 mm Hg (S). - Inferior vena cava: The vessel was severely dilated. The   respirophasic diameter changes were blunted (< 50%), consistent   with elevated central venous pressure.  [place] 10/2015: [facility] Procedural Findings: Hemodynamics (mmHg) RA mean 13 RV  49/12 PA 44/22, mean 29 PCWP mean 17 Oxygen saturations: PA 66% AO 96% Cardiac Output (Fick) 6.18  Cardiac Index (Fick) 3.04  PVR 1.9 WU  Review of systems complete and found to be negative unless listed in HPI.   Home Medications Prior to Admission medications   Medication Sig Start Date End Date Taking? Authorizing Provider  acetaminophen (TYLENOL) 500 MG tablet Take 1,000 mg by mouth every 6 (six) hours as needed for moderate pain or headache.    [provider]  allopurinol (ZYLOPRIM) 300 MG tablet Take 300 mg by mouth daily.    [provider]  ALPRAZolam Duanne Moron) 0.25 MG tablet Take 1 tablet (0.25 mg total) by mouth every 12 (twelve) hours as needed for anxiety or sleep. Patient taking differently: Take 0.25 mg by mouth at bedtime.  07/24/16   Lauree Chandler, NP  aspirin 81 MG chewable tablet Chew 1 tablet (81 mg total) by mouth daily. 02/08/18   Shirley Friar, PA-C  atorvastatin (LIPITOR) 10 MG tablet Take 1 tablet (10 mg total) by mouth daily. 10/28/17   Larey Dresser, MD  Biotin 5000 MCG CAPS Take 5,000 mcg by mouth daily.     [provider]  gabapentin (NEURONTIN) 100 MG capsule Take 100 mg by mouth daily.    [provider]  hydroxychloroquine (PLAQUENIL) 200 MG tablet Take 200 mg by mouth daily.     [provider]  metolazone (ZAROXOLYN) 2.5 MG tablet Take 1 tablet (2.5 mg total) by mouth 2 (two) times a week. Patient taking differently: Take 2.5 mg by mouth daily.  04/07/18   Larey Dresser, MD  metoprolol succinate (TOPROL-XL) 25 MG 24 hr tablet Take 1 tablet (25 mg total) by mouth daily. 03/25/18   Clegg, Amy D, NP  midodrine (PROAMATINE) 10 MG tablet Take 10 mg by mouth daily.    [provider]  midodrine (PROAMATINE) 5 MG tablet Take 1 tablet (5 mg total) by mouth 2 (two) times daily with a meal. 04/02/18   Larey Dresser, MD  Multiple Vitamin (MULTIVITAMIN WITH MINERALS) TABS tablet Take 1 tablet by  mouth daily. 02/08/18   Shirley Friar, PA-C  Omega-3 Fatty Acids (FISH OIL) 1200 MG CAPS Take 1,200 mg by mouth daily.    [provider]  OXYGEN Inhale into the lungs continuous.     [provider]  Polyethylene Glycol 400 (BLINK TEARS OP) Place 1-2 drops into both eyes at bedtime.    [provider]  potassium chloride SA (K-DUR,KLOR-CON) 20 MEQ tablet Take 2 tablets (40 mEq total) by mouth 2 (two) times daily. 03/17/18   Georgiana Shore, NP  Tiotropium Bromide-Olodaterol (STIOLTO RESPIMAT) 2.5-2.5 MCG/ACT AERS Inhale into the lungs.    [provider]  torsemide (DEMADEX) 20 MG tablet Take 5 tablets (100 mg total) by mouth 2 (two) times daily. 04/21/18   Larey Dresser, MD    Past Medical History: Past Medical History:  Diagnosis Date  . Anemia 03/23/15   transfusion  . Aortic aneurysm (  [place])   . CHF (congestive heart failure) ([place])   . Chronic lower back pain   . Chronic thoracic aortic dissection (HCC)    S/P repair ascending thoracic aortic dissection with chronic residual dissection involving transverse aortic arch and descending thoracic aorta  . CKD (chronic kidney disease) stage 3, GFR 30-59 ml/min (HCC)    "on dialysis for 3 months after my aneurysm in 2005" (02/20/2016)  . COPD (chronic obstructive pulmonary disease) ([place])   . Gout   . History of blood transfusion "several"  . Hypertension   . Lupus ([place])   . On home oxygen therapy    "3L; 24/7" (02/20/2016)  . OSA on CPAP   . Pulmonary HTN ([place])   . Rheumatoid arthritis(714.0) 11/06/2012  . S/P aortic dissection repair   . Tuberculosis    dormant carrier    Past Surgical History: Past Surgical History:  Procedure Laterality Date  . BREAST EXCISIONAL BIOPSY Right    benign  . BREAST LUMPECTOMY Right   . CARDIAC CATHETERIZATION N/A 10/24/2015   Procedure: Right Heart Cath;  Surgeon: Larey Dresser, MD;  Location: [place] [facility];  Service: Cardiovascular;  Laterality:  N/A;  . CATARACT EXTRACTION W/ INTRAOCULAR LENS  IMPLANT, BILATERAL Bilateral 2016  . COLONOSCOPY N/A 08/19/2015   Procedure: COLONOSCOPY;  Surgeon: Jerene Bears, MD;  Location: [facility] ENDOSCOPY;  Service: Endoscopy;  Laterality: N/A;  . ENTEROSCOPY N/A 02/22/2016   Procedure: ENTEROSCOPY;  Surgeon: Milus Banister, MD;  Location: [place];  Service: Endoscopy;  Laterality: N/A;  . ESOPHAGOGASTRODUODENOSCOPY N/A 08/19/2015   Procedure: ESOPHAGOGASTRODUODENOSCOPY (EGD);  Surgeon: Jerene Bears, MD;  Location: [facility] ENDOSCOPY;  Service: Endoscopy;  Laterality: N/A;  patient scheduled, anesthesia aware of 1500 case, per [place].  08/18/15 DP  . GIVENS CAPSULE STUDY N/A 08/21/2015   Procedure: GIVENS CAPSULE STUDY;  Surgeon: Gatha Mayer, MD;  Location: [place];  Service: Endoscopy;  Laterality: N/A;  . HEMORRHOID SURGERY    . MITRAL VALVE REPAIR N/A 01/29/2018   Procedure: MITRAL VALVE REPAIR;  Surgeon: Sherren Mocha, MD;  Location: [place] [facility];  Service: Cardiovascular;  Laterality: N/A;  . REPAIR OF ACUTE ASCENDING THORACIC AORTIC DISSECTION  2005  . RIGHT HEART CATHETERIZATION N/A 08/18/2014   Procedure: RIGHT HEART CATH;  Surgeon: Jolaine Artist, MD;  Location: [facility] CATH LAB;  Service: Cardiovascular;  Laterality: N/A;  . TEE WITHOUT CARDIOVERSION N/A 01/05/2015   Procedure: TRANSESOPHAGEAL ECHOCARDIOGRAM (TEE);  Surgeon: Larey Dresser, MD;  Location: [place];  Service: Cardiovascular;  Laterality: N/A;  . TEE WITHOUT CARDIOVERSION N/A 11/20/2017   Procedure: TRANSESOPHAGEAL ECHOCARDIOGRAM (TEE);  Surgeon: Larey Dresser, MD;  Location: [facility] ENDOSCOPY;  Service: Cardiovascular;  Laterality: N/A;  . WISDOM TOOTH EXTRACTION      Family History: Family History  Problem Relation Age of Onset  . Heart attack Mother   . Prostate cancer Father   . Diabetes Brother   . Kidney failure Brother   . Breast cancer Maternal Aunt   . Colon cancer Neg Hx   . Stomach cancer Neg Hx      Social History: Social History   Socioeconomic History  . Marital status: Legally Separated    Spouse name: Not on file  . Number of children: 1  . Years of education: Not on file  . Highest education level: Not on file  Occupational History  . Occupation: Disability    Comment: Office Work  Scientific laboratory technician  . Financial  resource strain: Not on file  . Food insecurity:    Worry: Not on file    Inability: Not on file  . Transportation needs:    Medical: Not on file    Non-medical: Not on file  Tobacco Use  . Smoking status: Former Smoker    Packs/day: 1.50    Years: 14.00    Pack years: 21.00    Types: Cigarettes    Last attempt to quit: 08/09/2003    Years since quitting: 14.8  . Smokeless tobacco: Never Used  Substance and Sexual Activity  . Alcohol use: Yes    Alcohol/week: 0.0 standard drinks    Comment: 02/20/2016 "drank a little in my teens"  . Drug use: No  . Sexual activity: Never  Lifestyle  . Physical activity:    Days per week: Not on file    Minutes per session: Not on file  . Stress: Not on file  Relationships  . Social connections:    Talks on phone: Not on file    Gets together: Not on file    Attends religious service: Not on file    Active member of club or organization: Not on file    Attends meetings of clubs or organizations: Not on file    Relationship status: Not on file  Other Topics Concern  . Not on file  Social History Narrative  . Not on file    Allergies:  No Known Allergies  Objective:    Vital Signs:   Temp:  [99.2 F (37.3 C)] 99.2 F (37.3 C) (11/15 1259) Pulse Rate:  [54-65] 65 (11/15 1300) Resp:  [25-30] 25 (11/15 1300) BP: (60-61)/(37-44) 61/44 (11/15 1300) SpO2:  [98 %] 98 % (11/15 1300)    Weight change: There were no vitals filed for this visit.  Intake/Output:  No intake or output data in the 24 hours ending 07/02/2018 1416    Physical Exam    General: Dyspneic at rest. Appears fatigued. Tearful HEENT:  normal Neck: supple. JVP to jaw. Carotids 2+ bilat; no bruits. No lymphadenopathy or thyromegaly appreciated. Cor: PMI nondisplaced. Iregular rhythm, brady. No rubs, gallops or murmurs. Lungs: diminished throughout Abdomen: soft, nontender, +distended. No hepatosplenomegaly. No bruits or masses. Good bowel sounds. +bruising. LLQ PD cath. Dressing CDI. Extremities: no cyanosis, clubbing, rash, 2-3+ edema into thighs. Cool Neuro: alert & orientedx3, cranial nerves grossly intact. moves all 4 extremities w/o difficulty. Affect tearful  Telemetry   Afib 60s. Personally reviewed.   EKG    Afib 60 bpm with ?mild ST elevation in V5 and V6.  Labs   Basic Metabolic Panel: Recent Labs  Lab 06/10/2018 1315  NA 133*  K 3.0*  CL 94*  GLUCOSE 150*  BUN 68*  CREATININE 6.10*    Liver Function Tests: No results for input(s): AST, ALT, ALKPHOS, BILITOT, PROT, ALBUMIN in the last 168 hours. No results for input(s): LIPASE, AMYLASE in the last 168 hours. No results for input(s): AMMONIA in the last 168 hours.  CBC: Recent Labs  Lab 07/03/2018 1315  HGB 12.9  HCT 38.0    Cardiac Enzymes: No results for input(s): CKTOTAL, CKMB, CKMBINDEX, TROPONINI in the last 168 hours.  BNP: BNP (last 3 results) Recent Labs    01/27/18 0953 03/25/18 1217  BNP 732.8* 529.3*    ProBNP (last 3 results) No results for input(s): PROBNP in the last 8760 hours.   CBG: No results for input(s): GLUCAP in the last 168 hours.  Coagulation Studies: No  results for input(s): LABPROT, INR in the last 72 hours.   Imaging   Dg Chest Port 1 View  Result Date: 06/30/2018 CLINICAL DATA:  Shortness of breath. EXAM: PORTABLE CHEST 1 VIEW COMPARISON:  02/06/2018 and 02/02/2018 and 01/27/2018 FINDINGS: There is chronic marked cardiomegaly. Pulmonary vascularity is within normal limits. Slight chronic thickening of the fissures on the right. No discrete infiltrates or effusions. No acute bone abnormality.  Chronic degenerative changes of both shoulders. IMPRESSION: No acute abnormality.  Chronic cardiomegaly. Electronically Signed   By: Lorriane Shire M.D.   On: 06/10/2018 13:26      Medications:     Current Medications:   Infusions: . norepinephrine (LEVOPHED) Adult infusion    . piperacillin-tazobactam    . sodium chloride    . vancomycin         Patient Profile   April Mueller is a 72 y.o. with COPD on home oxygen, rheumatoid arthritis, chronic atrial fibrillation off AC due to GIB, AAA repair 2005, severe MR s/p mitra clip 0/9604, chronic diastolic CHF, PAH, anemia, and ESRD on PD.    She presented to [facility] with progressive SOB and 30 lb weight gain since starting PD.  Assessment/Plan   1. Acute on chronic diastolic CHF with pulmonary hypertension/prominent RV failure:  - Echo 03/2018: EF 60-65%, grade 3 DD, RV severely dilated, mild AI, mild to mod MR, RA and LA severely dilated, severe TR, mild PI, PA peak pressure 74 mm Hg.  - Volume markedly overloaded - Will need to manage volume with dialysis. Nephrology has been consulted. Likely needs CRRT.  - She is on midodrine 10/5/5 at home. Will increase to 10 mg TID. Starting NE as below.  2. Hypotension -> ?Sepsis - SBP 60s. Lactic acid 5.0 > 2.48 - Agree with norepinephrine for BP. She will need central access. - Increase midodrine as above - Temp is 99.2. WBC 11.5. Blood culture x1 sent.  - She has received 1 L of fluid so far with little improvement in BP 3. Chronic respiratory failure:  - She is on 3 liters at rest, 4-5 L with exertion home oxygen by [place]. She has COPD and restriction from her body habitus (OHS).  - Continue CPAP qHS 4. ESRD  - Recently started peritoneal dialysis (early October). There is some question whether she is doing this correctly at home. She has felt poorly since starting.  - Nephrology consulted.  5. Pulmonary hypertension: This is primarily [place] group 2 (pulmonary venous hypertension) based on  RHC in 3/17.  She is not a good candidate for pulmonary vasodilators. 6. Mitral regurgitation:  Severe MR on 4/19 TEE due to partial flail of the anterior leaflet (A2 scallop). Now s/p Mitraclip in 6/19.  Post-procedure echo in 6/19 showed mild April, trivial-mild MR, echo in 8/19 showed mild-moderate MR, probably no significant April.   - She is on ASA 81 post-Mitraclip, no overt bleeding. Hemoglobin stable today.  7. Anemia: Suspect anemia of chronic disease/renal disease but also with component of GI blood loss.  She has had GI bleeding with FOBT+, but unable to visualize bleeding source on EGD, colonoscopy, enteroscopy, or capsule endoscopy (most recently in 7/17).  Seeing hematology (Dr. Irene Limbo), getting Aranesp and Fe infusions with Fe deficiency and possible MDS.  - Hemoglobin stable today.  8. Atrial fibrillation: Persistent.  Rate is controlled.  CHADSVASC = 3.  She was evaluated for [place] but decided not to be a good candidate. Due to recurrent severe GI bleeding, anticoagulation  was stopped altogether. No stroke-like symptoms.  9. 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 Platelets pending.  10. Positive troponin - Trop 0.60. Trend. Likely related to demand ischemia in setting of ?sepsis/volume overload - Denies CP.   Medication concerns reviewed with patient and pharmacy team. Barriers identified: none at this time.  Length of Stay: [place], NP  06/27/2018, 2:16 PM  Advanced Heart Failure Team Pager [phone] (M-F; 7a - 4p)  Please contact [facility] for night-coverage after hours (4p -7a ) and weekends on [email or web address]  Patient seen with NP, agree with the above note.   She has presented with marked volume overload and hypotension.  No fever, WBCs not significantly elevated.  Abdomen not tender. I think that this is less likely peritonitis/infection with septic shock and more likely inadequate peritoneal dialysis in the setting of RV  failure. I know her well, she has had a steady downward clinical course recently, not improved by Mitraclip this summer.  Her RV failure at this point is end stage.    I discussed the situation with Drs Lynetta Mare and Jonnie Finner.  I think that her prognosis here is grim.  She would be a poor HD candidate with baseline hypotension and RV dysfunction.  She is awake/alert despite SBP 60s-70s and states that she wants "one more try."  She understands that we are unlikely to be able to turn her around effectively.  She is willing to proceed to comfort care if pressors and PD cannot ameliorate the situation.    Plan at this time will be to use norepinephrine to try to maintain BP and allow for 24 hour peritoneal dialysis in an attempt to remove volume excess.  We will not place trialysis catheter for CVVH.  If this effort fails, she would be willing to move towards comfort care.   [name] 06/18/2018 4:35 PM

## 2018-06-20 NOTE — Consult Note (Addendum)
[facility] Renal Consultation Note    Indication for Consultation:  Management of ESRD/hemodialysis; anemia, hypertension/volume and secondary hyperparathyroidism Outpatient nephrologist: Joelyn Oms   HPI: April Mueller is a 72 y.o. female with ESRD on CCPD, severe MR/TR, Atrial fibrilliation, chronic dCHF, pulm HTN, COPD on chronic O2, hx of aortic dissection with repair.  2005, hx GIB, OSA on CPAP.   Sent to ED from home after home visit with PD nurse. Patient lives alone an having difficulty performing PD as directed. Unable to achieve UF and perform daily hygiene tasks. Noted SBPs 60s during visit today.  Remains hypotensive in ED. Temp 99.82F, BP 65/42.  Getting IVF bolus, Empiric antibiotics, and pressors to start. Labs: Na 133, K 3.0, BUN 68, CR 6.10, Hgb 12.9, Lactic acid 2.48  PCCM to admit to ICU.   Seen in ED, only able to speak few words before she tires, tearful. New to PD says has only been a month. Doesn't endorse any pain but feels abdomen is full. Endorsed worsening weakness, DOE. Last fill 2L this morning.     Past Medical History:  Diagnosis Date  . Anemia 03/23/15   transfusion  . Aortic aneurysm ([place])   . CHF (congestive heart failure) ([place])   . Chronic lower back pain   . Chronic thoracic aortic dissection (HCC)    S/P repair ascending thoracic aortic dissection with chronic residual dissection involving transverse aortic arch and descending thoracic aorta  . CKD (chronic kidney disease) stage 3, GFR 30-59 ml/min (HCC)    "on dialysis for 3 months after my aneurysm in 2005" (02/20/2016)  . COPD (chronic obstructive pulmonary disease) ([place])   . Gout   . History of blood transfusion "several"  . Hypertension   . Lupus ([place])   . On home oxygen therapy    "3L; 24/7" (02/20/2016)  . OSA on CPAP   . Pulmonary HTN ([place])   . Rheumatoid arthritis(714.0) 11/06/2012  . S/P aortic dissection repair   . Tuberculosis    dormant carrier   Past Surgical  History:  Procedure Laterality Date  . BREAST EXCISIONAL BIOPSY Right    benign  . BREAST LUMPECTOMY Right   . CARDIAC CATHETERIZATION N/A 10/24/2015   Procedure: Right Heart Cath;  Surgeon: Larey Dresser, MD;  Location: [place] [facility];  Service: Cardiovascular;  Laterality: N/A;  . CATARACT EXTRACTION W/ INTRAOCULAR LENS  IMPLANT, BILATERAL Bilateral 2016  . COLONOSCOPY N/A 08/19/2015   Procedure: COLONOSCOPY;  Surgeon: Jerene Bears, MD;  Location: [facility] ENDOSCOPY;  Service: Endoscopy;  Laterality: N/A;  . ENTEROSCOPY N/A 02/22/2016   Procedure: ENTEROSCOPY;  Surgeon: Milus Banister, MD;  Location: [place];  Service: Endoscopy;  Laterality: N/A;  . ESOPHAGOGASTRODUODENOSCOPY N/A 08/19/2015   Procedure: ESOPHAGOGASTRODUODENOSCOPY (EGD);  Surgeon: Jerene Bears, MD;  Location: [facility] ENDOSCOPY;  Service: Endoscopy;  Laterality: N/A;  patient scheduled, anesthesia aware of 1500 case, per [place].  08/18/15 DP  . GIVENS CAPSULE STUDY N/A 08/21/2015   Procedure: GIVENS CAPSULE STUDY;  Surgeon: Gatha Mayer, MD;  Location: [place];  Service: Endoscopy;  Laterality: N/A;  . HEMORRHOID SURGERY    . MITRAL VALVE REPAIR N/A 01/29/2018   Procedure: MITRAL VALVE REPAIR;  Surgeon: Sherren Mocha, MD;  Location: [place] [facility];  Service: Cardiovascular;  Laterality: N/A;  . REPAIR OF ACUTE ASCENDING THORACIC AORTIC DISSECTION  2005  . RIGHT HEART CATHETERIZATION N/A 08/18/2014   Procedure: RIGHT HEART CATH;  Surgeon: Jolaine Artist, MD;  Location: [place] CATH LAB;  Service: Cardiovascular;  Laterality: N/A;  . TEE WITHOUT CARDIOVERSION N/A 01/05/2015   Procedure: TRANSESOPHAGEAL ECHOCARDIOGRAM (TEE);  Surgeon: Larey Dresser, MD;  Location: [place];  Service: Cardiovascular;  Laterality: N/A;  . TEE WITHOUT CARDIOVERSION N/A 11/20/2017   Procedure: TRANSESOPHAGEAL ECHOCARDIOGRAM (TEE);  Surgeon: Larey Dresser, MD;  Location: [facility] ENDOSCOPY;  Service: Cardiovascular;  Laterality: N/A;  .  WISDOM TOOTH EXTRACTION     Family History  Problem Relation Age of Onset  . Heart attack Mother   . Prostate cancer Father   . Diabetes Brother   . Kidney failure Brother   . Breast cancer Maternal Aunt   . Colon cancer Neg Hx   . Stomach cancer Neg Hx    Social History:  reports that she quit smoking about 14 years ago. Her smoking use included cigarettes. She has a 21.00 pack-year smoking history. She has never used smokeless tobacco. She reports that she drinks alcohol. She reports that she does not use drugs. No Known Allergies Prior to Admission medications   Medication Sig Start Date End Date Taking? Authorizing Provider  acetaminophen (TYLENOL) 500 MG tablet Take 1,000 mg by mouth every 6 (six) hours as needed for moderate pain or headache.    [provider]  allopurinol (ZYLOPRIM) 300 MG tablet Take 300 mg by mouth daily.    [provider]  ALPRAZolam Duanne Moron) 0.25 MG tablet Take 1 tablet (0.25 mg total) by mouth every 12 (twelve) hours as needed for anxiety or sleep. Patient taking differently: Take 0.25 mg by mouth at bedtime.  07/24/16   Lauree Chandler, NP  aspirin 81 MG chewable tablet Chew 1 tablet (81 mg total) by mouth daily. 02/08/18   Shirley Friar, PA-C  atorvastatin (LIPITOR) 10 MG tablet Take 1 tablet (10 mg total) by mouth daily. 10/28/17   Larey Dresser, MD  Biotin 5000 MCG CAPS Take 5,000 mcg by mouth daily.     [provider]  gabapentin (NEURONTIN) 100 MG capsule Take 100 mg by mouth daily.    [provider]  hydroxychloroquine (PLAQUENIL) 200 MG tablet Take 200 mg by mouth daily.     [provider]  metolazone (ZAROXOLYN) 2.5 MG tablet Take 1 tablet (2.5 mg total) by mouth 2 (two) times a week. Patient taking differently: Take 2.5 mg by mouth daily.  04/07/18   Larey Dresser, MD  metoprolol succinate (TOPROL-XL) 25 MG 24 hr tablet Take 1 tablet (25 mg total) by mouth daily. 03/25/18   Clegg, Amy D,  NP  midodrine (PROAMATINE) 10 MG tablet Take 10 mg by mouth daily.    [provider]  midodrine (PROAMATINE) 5 MG tablet Take 1 tablet (5 mg total) by mouth 2 (two) times daily with a meal. 04/02/18   Larey Dresser, MD  Multiple Vitamin (MULTIVITAMIN WITH MINERALS) TABS tablet Take 1 tablet by mouth daily. 02/08/18   Shirley Friar, PA-C  Omega-3 Fatty Acids (FISH OIL) 1200 MG CAPS Take 1,200 mg by mouth daily.    [provider]  OXYGEN Inhale into the lungs continuous.     [provider]  Polyethylene Glycol 400 (BLINK TEARS OP) Place 1-2 drops into both eyes at bedtime.    [provider]  potassium chloride SA (K-DUR,KLOR-CON) 20 MEQ tablet Take 2 tablets (40 mEq total) by mouth 2 (two) times daily. 03/17/18   Georgiana Shore, NP  Tiotropium Bromide-Olodaterol (STIOLTO RESPIMAT) 2.5-2.5 MCG/ACT  AERS Inhale into the lungs.    [provider]  torsemide (DEMADEX) 20 MG tablet Take 5 tablets (100 mg total) by mouth 2 (two) times daily. 04/21/18   Larey Dresser, MD   Current Facility-Administered Medications  Medication Dose Route Frequency Provider Last Rate Last Dose  . 0.9 %  sodium chloride infusion  250 mL Intravenous Continuous Agarwala, Ravi, MD      . heparin injection 5,000 Units  5,000 Units Subcutaneous Q8H Jennelle Human B, NP      . midodrine (PROAMATINE) tablet 10 mg  10 mg Oral TID WC Georgiana Shore, NP      . norepinephrine (LEVOPHED) 4mg  in D5W 219mL premix infusion  0-40 mcg/min Intravenous Continuous Charlesetta Shanks, MD 7.5 mL/hr at 06/19/2018 1505 2 mcg/min at 07/05/2018 1505  . vancomycin (VANCOCIN) IVPB 1000 mg/200 mL premix  1,000 mg Intravenous Once Charlesetta Shanks, MD 200 mL/hr at 06/11/2018 1459 1,000 mg at 06/30/2018 1459   Current Outpatient Medications  Medication Sig Dispense Refill  . acetaminophen (TYLENOL) 500 MG tablet Take 1,000 mg by mouth every 6 (six) hours as needed for moderate pain or headache.    .  allopurinol (ZYLOPRIM) 300 MG tablet Take 300 mg by mouth daily.    [name] ALPRAZolam (XANAX) 0.25 MG tablet Take 1 tablet (0.25 mg total) by mouth every 12 (twelve) hours as needed for anxiety or sleep. (Patient taking differently: Take 0.25 mg by mouth at bedtime. ) 60 tablet 5  . aspirin 81 MG chewable tablet Chew 1 tablet (81 mg total) by mouth daily. 30 tablet 6  . atorvastatin (LIPITOR) 10 MG tablet Take 1 tablet (10 mg total) by mouth daily. 90 tablet 2  . Biotin 5000 MCG CAPS Take 5,000 mcg by mouth daily.     [name] gabapentin (NEURONTIN) 100 MG capsule Take 100 mg by mouth daily.    . hydroxychloroquine (PLAQUENIL) 200 MG tablet Take 200 mg by mouth daily.     . metolazone (ZAROXOLYN) 2.5 MG tablet Take 1 tablet (2.5 mg total) by mouth 2 (two) times a week. (Patient taking differently: Take 2.5 mg by mouth daily. ) 15 tablet 3  . metoprolol succinate (TOPROL-XL) 25 MG 24 hr tablet Take 1 tablet (25 mg total) by mouth daily. 30 tablet 3  . midodrine (PROAMATINE) 10 MG tablet Take 10 mg by mouth daily.    . midodrine (PROAMATINE) 5 MG tablet Take 1 tablet (5 mg total) by mouth 2 (two) times daily with a meal. 180 tablet 2  . Multiple Vitamin (MULTIVITAMIN WITH MINERALS) TABS tablet Take 1 tablet by mouth daily.    . Omega-3 Fatty Acids (FISH OIL) 1200 MG CAPS Take 1,200 mg by mouth daily.    . OXYGEN Inhale into the lungs continuous.     . Polyethylene Glycol 400 (BLINK TEARS OP) Place 1-2 drops into both eyes at bedtime.    . potassium chloride SA (K-DUR,KLOR-CON) 20 MEQ tablet Take 2 tablets (40 mEq total) by mouth 2 (two) times daily. 120 tablet 6  . Tiotropium Bromide-Olodaterol (STIOLTO RESPIMAT) 2.5-2.5 MCG/ACT AERS Inhale into the lungs.    . torsemide (DEMADEX) 20 MG tablet Take 5 tablets (100 mg total) by mouth 2 (two) times daily. 900 tablet 2     ROS: As per HPI otherwise negative.  Physical Exam: Vitals:   07/03/2018 1430 07/04/2018 1445 06/09/2018 1500 07/05/2018 1515  BP: (!) 62/39 (!)  68/42 (!) 60/40 (!) 73/51  Pulse: 60 61 65 (!)  58  Resp: (!) 0 (!) 27 (!) 31 (!) 34  Temp:      TempSrc:      SpO2: 98% 99% 97% 98%     General: Chronically ill appearing elderly female  Head: NCAT sclera not icteric facial edema  Neck: Supple. JVD to jawline  Lungs: Diminished bilaterally. No rales, wheeze  Heart: Distant, irregular  Abdomen: soft NT distented with PD fluid  Lower extremities: 3-4+ pitting LE edema to thighs  Neuro: A & O  X 3. Moves all extremities spontaneously. Psych:  Fatigued, tearful  Dialysis Access: PD cath in place   Labs: Basic Metabolic Panel: Recent Labs  Lab 06/10/2018 1315  NA 133*  K 3.0*  CL 94*  GLUCOSE 150*  BUN 68*  CREATININE 6.10*   Liver Function Tests: No results for input(s): AST, ALT, ALKPHOS, BILITOT, PROT, ALBUMIN in the last 168 hours. No results for input(s): LIPASE, AMYLASE in the last 168 hours. No results for input(s): AMMONIA in the last 168 hours. CBC: Recent Labs  Lab 06/18/2018 1307 06/27/2018 1315  WBC 11.5*  --   NEUTROABS 10.8*  --   HGB 10.6* 12.9  HCT 36.0 38.0  MCV 93.8  --   PLT PLATELET CLUMPS NOTED ON SMEAR, UNABLE TO ESTIMATE  --    Cardiac Enzymes: No results for input(s): CKTOTAL, CKMB, CKMBINDEX, TROPONINI in the last 168 hours. CBG: No results for input(s): GLUCAP in the last 168 hours. Iron Studies: No results for input(s): IRON, TIBC, TRANSFERRIN, FERRITIN in the last 72 hours. Studies/Results: Dg Chest Port 1 View  Result Date: 07/01/2018 CLINICAL DATA:  Shortness of breath. EXAM: PORTABLE CHEST 1 VIEW COMPARISON:  02/06/2018 and 02/02/2018 and 01/27/2018 FINDINGS: There is chronic marked cardiomegaly. Pulmonary vascularity is within normal limits. Slight chronic thickening of the fissures on the right. No discrete infiltrates or effusions. No acute bone abnormality. Chronic degenerative changes of both shoulders. IMPRESSION: No acute abnormality.  Chronic cardiomegaly. Electronically Signed   By:  Lorriane Shire M.D.   On: 06/08/2018 13:26    Dialysis Orders:  7X Week, EDW 60 (kg) Ca 2.5 (mEq/L) Mg 0.5 (mEq/L) Dextrose 1.5; 2.5; 4.25 %, # Day Exchanges 3, daytime fill vol 2000 mL Day Dwell Time 4 hrs 30 min, Night Exchanges 1, nighttime fill vol 2000 mL, Night Dwell Time 8 hrs 59 min,   Assessment/Plan: 1.  Shock - Etiology unclear. Empiric abx/pressors per PCCM Will send PD fluid sample tonight to assess for infection 2.  ESRD -  CCPD. Recent PD start. Per outpatient dialysis nurse not doing well with treatments by herself and not a candidate for HD. Recommend palliative involvement.  Will try continuous PD for now with all 4.25% and try to improve her resp status if we can get some fluid off.  Grossly fluid overloaded from ineffective PD and/or inability for her to successfully do the procedure at home. Will follow. Poor candidate for CRRT/ hemodialysis as previously decided due to heart disease.  3.  Hypotension/volume  - Pressors per PCCM. On midodrine at home. Hypervolemic on exam. UF as tolerated.  4.  Anemia  - 12.9. No ESA needs  5.  Metabolic bone disease -  No binder/VDRA. Follow labs here.  6.  Nutrition - Last outpatient albumin 3.1. Continue 7. AoC CHF- UF as tolerated  8. Severe MR/TR 9. Afib - not on anticoagulation d/t prior GI bleeding  10. Pulm HTN  Ogechi Larina Earthly [facility] Pager [phone] 06/13/2018,  3:37 PM   Pt seen, examined, agree w assess/plan as above with additions as indicated.  Kelly Splinter MD [facility] pager [phone]    cell [phone] 06/16/2018, 4:26 PM

## 2018-06-21 ENCOUNTER — Encounter (HOSPITAL_COMMUNITY): Payer: Self-pay | Admitting: Pulmonary Disease

## 2018-06-21 DIAGNOSIS — R6521 Severe sepsis with septic shock: Secondary | ICD-10-CM

## 2018-06-21 DIAGNOSIS — A419 Sepsis, unspecified organism: Principal | ICD-10-CM

## 2018-06-21 LAB — CBC
HCT: 34.4 % — ABNORMAL LOW (ref 36.0–46.0)
HEMOGLOBIN: 10 g/dL — AB (ref 12.0–15.0)
MCH: 26.7 pg (ref 26.0–34.0)
MCHC: 29.1 g/dL — ABNORMAL LOW (ref 30.0–36.0)
MCV: 92 fL (ref 80.0–100.0)
Platelets: 85 10*3/uL — ABNORMAL LOW (ref 150–400)
RBC: 3.74 MIL/uL — AB (ref 3.87–5.11)
RDW: 14.6 % (ref 11.5–15.5)
WBC: 12.7 10*3/uL — ABNORMAL HIGH (ref 4.0–10.5)
nRBC: 0.2 % (ref 0.0–0.2)

## 2018-06-21 LAB — COMPREHENSIVE METABOLIC PANEL
ALT: 19 U/L (ref 0–44)
AST: 27 U/L (ref 15–41)
Albumin: 1.7 g/dL — ABNORMAL LOW (ref 3.5–5.0)
Alkaline Phosphatase: 77 U/L (ref 38–126)
Anion gap: 13 (ref 5–15)
BUN: 51 mg/dL — ABNORMAL HIGH (ref 8–23)
CALCIUM: 9.1 mg/dL (ref 8.9–10.3)
CO2: 25 mmol/L (ref 22–32)
CREATININE: 6.59 mg/dL — AB (ref 0.44–1.00)
Chloride: 95 mmol/L — ABNORMAL LOW (ref 98–111)
GFR calc non Af Amer: 6 mL/min — ABNORMAL LOW (ref >60)
GFR, EST AFRICAN AMERICAN: 7 mL/min — AB (ref >60)
Glucose, Bld: 72 mg/dL (ref 70–99)
Potassium: 2.8 mmol/L — ABNORMAL LOW (ref 3.5–5.1)
SODIUM: 133 mmol/L — AB (ref 135–145)
Total Bilirubin: 0.7 mg/dL (ref 0.3–1.2)
Total Protein: 5.3 g/dL — ABNORMAL LOW (ref 6.5–8.1)

## 2018-06-21 MED ORDER — ACETAMINOPHEN 325 MG PO TABS: 650.0000 mg | ORAL_TABLET | Freq: Four times a day (QID) | ORAL | Status: DC | PRN

## 2018-06-21 MED ORDER — SIMETHICONE 80 MG PO CHEW
80.0000 mg | CHEWABLE_TABLET | Freq: Four times a day (QID) | ORAL | Status: DC | PRN
  Administered 2018-06-21: 80 mg via ORAL
  Filled 2018-06-21 (×2): qty 1

## 2018-06-21 MED ORDER — POTASSIUM CHLORIDE CRYS ER 20 MEQ PO TBCR: 40.0000 meq | EXTENDED_RELEASE_TABLET | Freq: Once | ORAL | Status: DC

## 2018-06-21 MED ORDER — LORAZEPAM 2 MG/ML IJ SOLN
1.0000 mg | INTRAMUSCULAR | Status: DC | PRN
  Administered 2018-06-23 – 2018-06-26 (×3): 1 mg via INTRAVENOUS
  Filled 2018-06-21 (×3): qty 1

## 2018-06-21 NOTE — Progress Notes (Signed)
I responded to a [place] to provide Advance Directive information for the patient. I visited the patient's room with her son, daughter-in-law, nephew, and grandson present. I shared an overview of the AD and answered their questions. I left the AD for the patient to complete at a later time. I provided spiritual support by sharing words of encouragement and by leading in prayer. I shared that the Chaplain is available for additional support as needed or requested.    06/21/18 1300  Clinical Encounter Type  Visited With Patient and family together  Visit Type Spiritual support  Referral From Nurse  Consult/Referral To Chaplain  Spiritual Encounters  Spiritual Needs Literature;Prayer  Stress Factors  Patient Stress Factors Exhausted  Family Stress Factors None identified    Chaplain Dr Redgie Grayer

## 2018-06-21 NOTE — Progress Notes (Signed)
Pt transferred to 6N by bed. Selinda Orion, charge RN 73M called report to receiving RN

## 2018-06-21 NOTE — Progress Notes (Signed)
Pt requesting to speak w/ chaplain re:Advanced Directive. Spoke w/ on-call chaplain who states able to come by before pt transfers to [facility]

## 2018-06-21 NOTE — Progress Notes (Addendum)
[facility] Progress Note  Subjective: late afternoon patient decided to forego aggressive Rx and was switched to no escalation , partial DNR per CCM  Vitals:   06/21/18 0410 06/21/18 0500 06/21/18 0600 06/21/18 0715  BP:  (!) 66/45 (!) 68/52   Pulse:  (!) 54 (!) 56   Resp:  16 17   Temp: 97.6 F (36.4 C)   98 F (36.7 C)  TempSrc: Oral   Oral  SpO2:  99% 99%   Weight:  68.1 kg      Inpatient medications: . heparin  5,000 Units Subcutaneous Q8H  . midodrine  10 mg Oral TID WC   . sodium chloride Stopped (06/21/18 0542)  . norepinephrine (LEVOPHED) Adult infusion 6 mcg/min (06/21/18 0836)  . piperacillin-tazobactam (ZOSYN)  IV 12.5 mL/hr at 06/21/18 0600   dianeal solution for CAPD/CCPD with heparin, HYDROmorphone (DILAUDID) injection  Iron/TIBC/Ferritin/ %Sat    Component Value Date/Time   IRON 23 (L) 02/02/2018 1100   IRON 36 (L) 06/11/2017 0810   TIBC 266 02/02/2018 1100   TIBC 263 06/11/2017 0810   FERRITIN 185 06/11/2017 0810   IRONPCTSAT 9 (L) 02/02/2018 1100   IRONPCTSAT 14 (L) 06/11/2017 0810    Exam: General: Chronically ill appearing elderly female  Head: NCAT sclera not icteric facial edema  Neck: Supple. JVD to jawline  Lungs: Diminished bilaterally. No rales, wheeze  Heart: Distant, irregular  Abdomen: soft NT distented with PD fluid  Lower extremities: 3-4+ pitting LE edema to thighs  Neuro: A & O  X 3. Moves all extremities spontaneously. Psych:  Fatigued, tearful  Dialysis Access: PD cath in place   Dialysis Orders:  7X Week, EDW 60 (kg) Ca 2.5 (mEq/L) Mg 0.5 (mEq/L) Dextrose 1.5; 2.5; 4.25 %, # Day Exchanges 3, daytime fill vol 2000 mL Day Dwell Time 4 hrs 30 min, Night Exchanges 1, nighttime fill vol 2000 mL, Night Dwell Time 8 hrs 59 min,   Assessment/Plan: 1. Shock - prob cardiogenic, hx severe pulm HTN/ RHF 2. ESRD - pt recent start to PD, not HD candidate due to severe HF. Pt declared she didn't want aggressive Rx late  yesterday, changed to DNR.  No plans for further dialysis at this time.  Will follow from a distance.   3. Anemia  - 12.9. No ESA needs 4. Severe MR/TR/ pulm HTN 5. Afib - not on anticoagulation d/t prior GI bleeding 6. DNR - comfort care as per discussions yesterday   Kelly Splinter MD [facility] pager [phone]   06/21/2018, 8:41 AM   Recent Labs  Lab 06/15/2018 1307 06/22/2018 1315 06/21/18 0230  NA 136 133* 133*  K 2.6* 3.0* 2.8*  CL 94* 94* 95*  CO2 25  --  25  GLUCOSE 150* 150* 72  BUN 47* 68* 51*  CREATININE 6.48* 6.10* 6.59*  CALCIUM 9.4  --  9.1  ALBUMIN 1.9*  --  1.7*  INR 1.13  --   --    Recent Labs  Lab 06/29/2018 1307 06/21/18 0230  AST 37 27  ALT 22 19  ALKPHOS 79 77  BILITOT 0.8 0.7  PROT 5.8* 5.3*   Recent Labs  Lab 06/23/2018 1307 06/16/2018 1315 06/21/18 0230  WBC 11.5*  --  12.7*  NEUTROABS 10.8*  --   --   HGB 10.6* 12.9 10.0*  HCT 36.0 38.0 34.4*  MCV 93.8  --  92.0  PLT PLATELET CLUMPS NOTED ON SMEAR, UNABLE TO ESTIMATE  --  85*

## 2018-06-21 NOTE — Progress Notes (Signed)
PULMONARY / CRITICAL CARE MEDICINE   NAME:  April Mueller, MRN:  [id number], DOB:  04-13-1946, LOS: 1 ADMISSION DATE:  06/14/2018, CONSULTATION DATE:  06/25/2018 REFERRING MD:  Dr. Vallery Ridge, CHIEF COMPLAINT:  shock  BRIEF HISTORY:    72 yo female with hx of ESRD on peritoneal dialysis presented with dyspnea, weakness, shock with concern for sepsis and cardiac causes. SIGNIFICANT PAST MEDICAL HISTORY   COPD on 3L home O2, Afib not on AC 2/2 GIB bleeding, severe PAH w/prior PAP 74 mmHg on TTE 03/2018, rheumatoid arthritis, AAA s/p repair 2005, severe MR s/p mitral clip 12/8848, chronic diastolic HF, PAH, anemia, ESRD on home PD  SIGNIFICANT EVENTS:  11/15 Admit 11/16 DNR, comfort care, transfer to floor bed  STUDIES:   03/2018 TTE >> LV 60%, s/p mitraclip.  Severe biatrial enlargement, severe dilation of IVC and elevation of right sided filling pressures.  Septal flattening consistent with RV pressure and volume overload.  CULTURES:  11/15 BC x 2 >>  11/15 UC >> 11/15 PD fluid >>  ANTIBIOTICS:  11/15 vancomycin >> 11/16 11/15 cefepime x1 11/15 zosyn >> 11/16  LINES/TUBES:  PTA PD catheter  CONSULTANTS:  11/15 HF  11/15 Nephrology   SUBJECTIVE:  She understands what is going to happen and is comfortable with her decision.  OBJECTIVE:   BP (!) 68/52   Pulse (!) 56   Temp 98.3 F (36.8 C) (Oral)   Resp 17   Wt 68.1 kg   SpO2 99%   BMI 25.77 kg/m   I/O last 3 completed shifts: In: 1754.8 [I.V.:327.4; IV Piggyback:1427.4] Out: 0   PHYSICAL EXAM:  General - alert Eyes - pupils reactive ENT - no sinus tenderness, no stridor Cardiac - irregular, 2/6 SM Chest - decreased BS Abdomen - soft, non tender Extremities - decreased muscle bulk Skin - no rashes Neuro - follows commands  ASSESSMENT  Septic shock Cardiogenic shock Pulmonary hypertension with right heart failure ESRD on peritoneal dialysis Chronic hypoxic respiratory failure COPD Atrial  fibrillation Chronic diastolic CHF Mitral regurgitation Hypokalemia Severe protein calorie malnutrition Anemia of critical illness and chronic disease Thrombocytopenia in setting of sepsis Lactic acidosis  PLAN:  DNR/DNI No further dialysis D/c ABx, pressors Comfort measures Transfer to floor bed 11/16 Will ask [facility] to assume care from 11/17  Chesley Mires, MD [place] 06/21/2018, 11:34 AM

## 2018-06-22 DIAGNOSIS — Z992 Dependence on renal dialysis: Secondary | ICD-10-CM

## 2018-06-22 DIAGNOSIS — N19 Unspecified kidney failure: Secondary | ICD-10-CM

## 2018-06-22 NOTE — Progress Notes (Signed)
PROGRESS NOTE    April Mueller  ATF:[id number] DOB: 11/11/1945 DOA: 06/27/2018 PCP: Lawerance Cruel, MD \  Brief Narrative:  72 year old with past medical history relevant for COPD with chronic hypoxic respiratory failure on 2 L, ESRD on PD started 1 week ago (not a candidate for intermittent hemodialysis due to severe RV failure and hypertension) 6, severe mitral regurgitation status post mitral clip on 02/01/2018, obstructive sleep apnea, chronic grade 3 diastolic dysfunction, hypoproliferative anemia, gout, recurrent GI bleeding, hyperlipidemia admitted with mixed cardiogenic and septic shock admitted to the ICU and now transition to comfort care.   Assessment & Plan:   Active Problems:   Renal failure treated with peritoneal dialysis ([place])   Septic shock (HCC)   RVF (right ventricular failure) ([place])   Cardiogenic shock (HCC)   Pressure injury of skin   #) Comfort care: Due to the terminal nature of patient's multiple organ failure she is not a candidate for intermittent hemodialysis as she would not tolerate it. -Palliative care consult - Continue hydromorphone for pain and lorazepam for anxiety  #) COPD/chronic hypoxic respiratory failure: -Continue home oxygen -Discontinue home bronchodilators  #)  ESRD on PD: Apparently patient failed PD.  She is not a candidate for intermittent hemodialysis due to her hypotension. - Per above  #) Status post mitral clip/RV failure/grade 3 diastolic dysfunction: Currently not being treated  #) Hypertension/hyperlipidemia/anemia: Home medications discontinued  Fluids: Tolerating p.o. Electrodes: Stop lab checks Nutrition: P.o. ad lib.  Prophylaxis: None  Disposition: Pending residential hospice versus death in the hospital  DO NOT RESUSCITATE   Consultants:   PCCM  Palliative care  Procedures:   None  Antimicrobials: (  IV vancomycin and Zosyn discontinued   Subjective: That she is doing fairly well.  She does  have some pain but she is otherwise not short of breath.  She denies any cough, congestion, rhinorrhea, nausea, vomiting, diarrhea.  Objective: Vitals:   06/21/18 1400 06/21/18 1415 06/21/18 1430 06/21/18 1500  BP: (!) 56/28   (!) 65/39  Pulse: (!) 53 (!) 58 (!) 55 (!) 49  Resp: (!) 23 18 (!) 25 (!) 24  Temp:    98.2 F (36.8 C)  TempSrc:    Oral  SpO2: 99% 99% 100% 100%  Weight:        Intake/Output Summary (Last 24 hours) at 06/22/2018 1250 Last data filed at 06/22/2018 1030 Gross per 24 hour  Intake 566.94 ml  Output -  Net 566.94 ml   Filed Weights   06/18/2018 1848 07/01/2018 1918 06/21/18 0500  Weight: 69.7 kg 67.2 kg 68.1 kg    Examination:  General exam: Appears calm and comfortable  Respiratory system: Clear to auscultation. Respiratory effort normal. Cardiovascular system: S1 & S2 heard, RRR. No JVD, murmurs, rubs, gallops or clicks. No pedal edema. Gastrointestinal system: Abdomen is nondistended, soft and nontender. No organomegaly or masses felt. Normal bowel sounds heard. Central nervous system: Alert and oriented.  Grossly intact, moving all extremities Extremities: 1+ lower extremity edema Skin: No rashes over visible skin Psychiatry: Judgement and insight appear normal. Mood & affect appropriate.     Data Reviewed: I have personally reviewed following labs and imaging studies  CBC: Recent Labs  Lab 06/28/2018 1307 06/14/2018 1315 06/21/18 0230  WBC 11.5*  --  12.7*  NEUTROABS 10.8*  --   --   HGB 10.6* 12.9 10.0*  HCT 36.0 38.0 34.4*  MCV 93.8  --  92.0  PLT PLATELET CLUMPS NOTED ON  SMEAR, UNABLE TO ESTIMATE  --  85*   Basic Metabolic Panel: Recent Labs  Lab 06/08/2018 1307 06/25/2018 1315 06/21/18 0230  NA 136 133* 133*  K 2.6* 3.0* 2.8*  CL 94* 94* 95*  CO2 25  --  25  GLUCOSE 150* 150* 72  BUN 47* 68* 51*  CREATININE 6.48* 6.10* 6.59*  CALCIUM 9.4  --  9.1   GFR: Estimated Creatinine Clearance: 7.5 mL/min (A) (by C-G formula based on SCr  of 6.59 mg/dL (H)). Liver Function Tests: Recent Labs  Lab 06/19/2018 1307 06/21/18 0230  AST 37 27  ALT 22 19  ALKPHOS 79 77  BILITOT 0.8 0.7  PROT 5.8* 5.3*  ALBUMIN 1.9* 1.7*   Recent Labs  Lab 06/13/2018 1307  LIPASE 22   No results for input(s): AMMONIA in the last 168 hours. Coagulation Profile: Recent Labs  Lab 06/27/2018 1307  INR 1.13   Cardiac Enzymes: No results for input(s): CKTOTAL, CKMB, CKMBINDEX, TROPONINI in the last 168 hours. BNP (last 3 results) No results for input(s): PROBNP in the last 8760 hours. HbA1C: No results for input(s): HGBA1C in the last 72 hours. CBG: Recent Labs  Lab 06/21/2018 1659  GLUCAP 105*   Lipid Profile: No results for input(s): CHOL, HDL, LDLCALC, TRIG, CHOLHDL, LDLDIRECT in the last 72 hours. Thyroid Function Tests: No results for input(s): TSH, T4TOTAL, FREET4, T3FREE, THYROIDAB in the last 72 hours. Anemia Panel: No results for input(s): VITAMINB12, FOLATE, FERRITIN, TIBC, IRON, RETICCTPCT in the last 72 hours. Sepsis Labs: Recent Labs  Lab 06/23/2018 1316 07/03/2018 1448  LATICACIDVEN 5.00* 2.48*    Recent Results (from the past 240 hour(s))  Culture, blood (routine x 2)     Status: None (Preliminary result)   Collection Time: 06/28/2018  1:07 PM  Result Value Ref Range Status   Specimen Description BLOOD BLOOD LEFT FOREARM  Final   Special Requests   Final    BOTTLES DRAWN AEROBIC AND ANAEROBIC Blood Culture adequate volume   Culture   Final    NO GROWTH 1 DAY Performed at [facility], [place] [address] [place], [place] [address]    Report Status PENDING  Incomplete  Culture, blood (routine x 2)     Status: None (Preliminary result)   Collection Time: 06/08/2018  1:07 PM  Result Value Ref Range Status   Specimen Description BLOOD BLOOD RIGHT FOREARM  Final   Special Requests   Final    BOTTLES DRAWN AEROBIC AND ANAEROBIC Blood Culture adequate volume   Culture   Final    NO GROWTH 1 DAY Performed at [facility], [place] [address], [place], [place] [address]    Report Status PENDING  Incomplete  MRSA PCR Screening     Status: Abnormal   Collection Time: 06/18/2018  4:03 PM  Result Value Ref Range Status   MRSA by PCR POSITIVE (A) NEGATIVE Final    Comment:        The GeneXpert MRSA Assay (FDA approved for NASAL specimens only), is one component of a comprehensive MRSA colonization surveillance program. It is not intended to diagnose MRSA infection nor to guide or monitor treatment for MRSA infections. RESULT CALLED TO, READ BACK BY AND VERIFIED WITH: Shelbie Hutching RN [phone] [id number] [phone] Performed at [facility], [place] [address], [place], [place] [address]          Radiology Studies: Dg Chest Port 1 View  Result Date: 06/12/2018 CLINICAL DATA:  Shortness of breath. EXAM: PORTABLE CHEST 1 VIEW COMPARISON:  02/06/2018 and 02/02/2018 and 01/27/2018 FINDINGS: There is chronic marked cardiomegaly. Pulmonary vascularity is within normal limits. Slight chronic thickening of the fissures on the right. No discrete infiltrates or effusions. No acute bone abnormality. Chronic degenerative changes of both shoulders. IMPRESSION: No acute abnormality.  Chronic cardiomegaly. Electronically Signed   By: Lorriane Shire M.D.   On: 06/30/2018 13:26        Scheduled Meds: Continuous Infusions:   LOS: 2 days    Time spent: [place], MD [facility] If 7PM-7AM, please contact night-coverage [email or web address] Password [email or web address] 06/22/2018, 12:50 PM

## 2018-06-22 NOTE — Progress Notes (Signed)
  Patient with progressive renal failure and failing PD. Now on full comfort care.   AHF team will sign off. Please call if we can help in any way.   Glori Bickers, MD  11:50 AM

## 2018-06-23 MED ORDER — IPRATROPIUM-ALBUTEROL 0.5-2.5 (3) MG/3ML IN SOLN
3.0000 mL | Freq: Three times a day (TID) | RESPIRATORY_TRACT | Status: DC
  Filled 2018-06-23: qty 3

## 2018-06-23 MED ORDER — CHLORHEXIDINE GLUCONATE CLOTH 2 % EX PADS: 6.0000 | MEDICATED_PAD | Freq: Every day | CUTANEOUS | Status: DC

## 2018-06-23 MED ORDER — ALBUTEROL SULFATE (2.5 MG/3ML) 0.083% IN NEBU: 2.5000 mg | INHALATION_SOLUTION | RESPIRATORY_TRACT | Status: DC | PRN

## 2018-06-23 MED ORDER — MUPIROCIN 2 % EX OINT
1.0000 "application " | TOPICAL_OINTMENT | Freq: Two times a day (BID) | CUTANEOUS | Status: DC
  Administered 2018-06-23 – 2018-06-25 (×6): 1 via NASAL
  Filled 2018-06-23: qty 22

## 2018-06-23 MED ORDER — IPRATROPIUM-ALBUTEROL 0.5-2.5 (3) MG/3ML IN SOLN
3.0000 mL | Freq: Four times a day (QID) | RESPIRATORY_TRACT | Status: DC
  Administered 2018-06-23 (×2): 3 mL via RESPIRATORY_TRACT
  Filled 2018-06-23 (×2): qty 3

## 2018-06-23 NOTE — Progress Notes (Signed)
   06/23/18 1100  Clinical Encounter Type  Visited With Patient;Health care provider  Visit Type Initial;Spiritual support  Spiritual Encounters  Spiritual Needs Prayer;Emotional  Stress Factors  Patient Stress Factors Exhausted   Introductory visit, pt desired prayer.  She reports that she has had family come visit over the weekend.  Pt appeared exhausted, so left to allow her to rest w/ hope of visiting again later this week.  Chaplain remains available for add'l support.  [name] resident, [phone]

## 2018-06-23 NOTE — Progress Notes (Addendum)
PROGRESS NOTE    April Mueller  GEZ:[id number] DOB: January 08, 1946 DOA: 06/25/2018 PCP: April Cruel, MD   Brief Narrative:   72 year old with past medical history relevant for COPD with chronic hypoxic respiratory failure on 2 L, ESRD on PD started 1 week ago (not a candidate for intermittent hemodialysis due to severe RV failure and hypertension) 6, severe mitral regurgitation status post mitral clip on 02/01/2018, obstructive sleep apnea, chronic grade 3 diastolic dysfunction, hypoproliferative anemia, gout, recurrent GI bleeding, hyperlipidemia initially admitted with mixed cardiogenic and septic shock admitted to the ICU and now transition to comfort care.  Assessment & Plan:   Active Problems:   Renal failure treated with peritoneal dialysis ([place])   Septic shock (HCC)   RVF (right ventricular failure) ([place])   Cardiogenic shock (HCC)   Pressure injury of skin   #) Comfort care: Due to the terminal nature of patient's multiple organ failure she is not a candidate for intermittent hemodialysis as she would not tolerate it. -Palliative care consult pending - Continue hydromorphone for pain and lorazepam for anxiety  #) COPD/chronic hypoxic respiratory failure: -Continue home oxygen -Discontinue home bronchodilators yesterday but patient complaining of dyspnea and therefore will restart PRN -- cool room down  --fan offered but pt declines  #)  ESRD on PD: Apparently patient failed PD.  She is not a candidate for intermittent hemodialysis due to her hypotension. - Per above  #) Status post mitral clip/RV failure/grade 3 diastolic dysfunction: Currently not being treated  #) Hypertension/hyperlipidemia/anemia: Home medications discontinued #) unable to determine etiology of septic shock #) pressure ulcer of right and left heel: present on admission  Fluids: Tolerating p.o. Electrodes: Stop lab checks Nutrition: P.o. ad lib.  Prophylaxis: None  Disposition:  Pending residential hospice versus death in the hospital  DO NOT RESUSCITATE   Consultants:   PCCM  Palliative care  Procedures:   None  Antimicrobials:   IV vancomycin and Zosyn discontinued  Subjective: Patient is feeling short of breath today.  Nursing has increased her oxygen.  I have turned the temperature down a little bit in the room today.  I will restart bronchodilators for comfort.  Objective: Vitals:   06/21/18 1415 06/21/18 1430 06/21/18 1500 06/23/18 0614  BP:   (!) 65/39 (!) 69/46  Pulse: (!) 58 (!) 55 (!) 49 66  Resp: 18 (!) 25 (!) 24 (!) 22  Temp:   98.2 F (36.8 C) 98.8 F (37.1 C)  TempSrc:   Oral Oral  SpO2: 99% 100% 100% 98%  Weight:        Intake/Output Summary (Last 24 hours) at 06/23/2018 1319 Last data filed at 06/23/2018 0519 Gross per 24 hour  Intake 155 ml  Output 0 ml  Net 155 ml   Filed Weights   06/18/2018 1848 06/28/2018 1918 06/21/18 0500  Weight: 69.7 kg 67.2 kg 68.1 kg    Examination:  General exam: Appears calm and comfortable  Respiratory system: Clear to auscultation. Respiratory effort normal. Cardiovascular system: S1 & S2 heard, RRR. No JVD, murmurs, rubs, gallops or clicks. No pedal edema. Gastrointestinal system: Abdomen is nondistended, soft and nontender. No organomegaly or masses felt. Normal bowel sounds heard. Central nervous system: Alert and oriented. No focal neurological deficits. Extremities: Symmetric 5 x 5 power. Skin: No rashes, lesions or ulcers Psychiatry: Judgement and insight appear normal. Mood & affect appropriate.     Data Reviewed: I have personally reviewed following labs and imaging studies  CBC: Recent Labs  Lab 06/17/2018 1307 06/14/2018 1315 06/21/18 0230  WBC 11.5*  --  12.7*  NEUTROABS 10.8*  --   --   HGB 10.6* 12.9 10.0*  HCT 36.0 38.0 34.4*  MCV 93.8  --  92.0  PLT PLATELET CLUMPS NOTED ON SMEAR, UNABLE TO ESTIMATE  --  85*   Basic Metabolic Panel: Recent Labs  Lab  06/28/2018 1307 06/08/2018 1315 06/21/18 0230  NA 136 133* 133*  K 2.6* 3.0* 2.8*  CL 94* 94* 95*  CO2 25  --  25  GLUCOSE 150* 150* 72  BUN 47* 68* 51*  CREATININE 6.48* 6.10* 6.59*  CALCIUM 9.4  --  9.1   GFR: Estimated Creatinine Clearance: 7.5 mL/min (A) (by C-G formula based on SCr of 6.59 mg/dL (H)). Liver Function Tests: Recent Labs  Lab 06/23/2018 1307 06/21/18 0230  AST 37 27  ALT 22 19  ALKPHOS 79 77  BILITOT 0.8 0.7  PROT 5.8* 5.3*  ALBUMIN 1.9* 1.7*   Recent Labs  Lab 06/17/2018 1307  LIPASE 22   No results for input(s): AMMONIA in the last 168 hours. Coagulation Profile: Recent Labs  Lab 06/25/2018 1307  INR 1.13   CBG: Recent Labs  Lab 07/02/2018 1659  GLUCAP 105*   Sepsis Labs: Recent Labs  Lab 06/30/2018 1316 06/06/2018 1448  LATICACIDVEN 5.00* 2.48*    Recent Results (from the past 240 hour(s))  Culture, blood (routine x 2)     Status: None (Preliminary result)   Collection Time: 06/11/2018  1:07 PM  Result Value Ref Range Status   Specimen Description BLOOD BLOOD LEFT FOREARM  Final   Special Requests   Final    BOTTLES DRAWN AEROBIC AND ANAEROBIC Blood Culture adequate volume   Culture   Final    NO GROWTH 3 DAYS Performed at [facility], [place] [address], [place], [place] [address]    Report Status PENDING  Incomplete  Culture, blood (routine x 2)     Status: None (Preliminary result)   Collection Time: 06/21/2018  1:07 PM  Result Value Ref Range Status   Specimen Description BLOOD BLOOD RIGHT FOREARM  Final   Special Requests   Final    BOTTLES DRAWN AEROBIC AND ANAEROBIC Blood Culture adequate volume   Culture   Final    NO GROWTH 3 DAYS Performed at [facility], [place] [address] [place], [place] [address]    Report Status PENDING  Incomplete  MRSA PCR Screening     Status: Abnormal   Collection Time: 06/07/2018  4:03 PM  Result Value Ref Range Status   MRSA by PCR POSITIVE (A) NEGATIVE Final    Comment:        The GeneXpert  MRSA Assay (FDA approved for NASAL specimens only), is one component of a comprehensive MRSA colonization surveillance program. It is not intended to diagnose MRSA infection nor to guide or monitor treatment for MRSA infections. RESULT CALLED TO, READ BACK BY AND VERIFIED WITH: Shelbie Hutching RN [phone] [id number] [phone] Performed at [facility], [place] [address], [place], [place] [address]          Radiology Studies: No results found.      Scheduled Meds: . Chlorhexidine Gluconate Cloth  6 each Topical Q0600  . mupirocin ointment  1 application Nasal BID   Continuous Infusions:   LOS: 3 days    Time spent:40 minutes    Lady Deutscher, MD FACP [facility] Pager [phone]  If  7PM-7AM, please contact night-coverage [email or web address] Password [email or web address] 06/23/2018, 1:19 PM

## 2018-06-24 ENCOUNTER — Other Ambulatory Visit: Payer: Self-pay

## 2018-06-24 DIAGNOSIS — I132 Hypertensive heart and chronic kidney disease with heart failure and with stage 5 chronic kidney disease, or end stage renal disease: Secondary | ICD-10-CM

## 2018-06-24 DIAGNOSIS — I509 Heart failure, unspecified: Secondary | ICD-10-CM

## 2018-06-24 DIAGNOSIS — Z515 Encounter for palliative care: Secondary | ICD-10-CM

## 2018-06-24 DIAGNOSIS — I1310 Hypertensive heart and chronic kidney disease without heart failure, with stage 1 through stage 4 chronic kidney disease, or unspecified chronic kidney disease: Secondary | ICD-10-CM

## 2018-06-24 MED ORDER — HALOPERIDOL LACTATE 5 MG/ML IJ SOLN: 0.5000 mg | INTRAMUSCULAR | Status: DC | PRN

## 2018-06-24 MED ORDER — HYDROMORPHONE HCL 1 MG/ML IJ SOLN
1.0000 mg | INTRAMUSCULAR | Status: DC | PRN
  Administered 2018-06-24 (×2): 2 mg via INTRAVENOUS
  Administered 2018-06-25 – 2018-06-26 (×4): 1 mg via INTRAVENOUS
  Filled 2018-06-24 (×2): qty 2
  Filled 2018-06-24 (×4): qty 1

## 2018-06-24 MED ORDER — GLYCOPYRROLATE 1 MG PO TABS
1.0000 mg | ORAL_TABLET | ORAL | Status: DC | PRN
  Filled 2018-06-24: qty 1

## 2018-06-24 MED ORDER — HALOPERIDOL LACTATE 2 MG/ML PO CONC
0.5000 mg | ORAL | Status: DC | PRN
  Filled 2018-06-24: qty 0.3

## 2018-06-24 MED ORDER — GLYCOPYRROLATE 0.2 MG/ML IJ SOLN: 0.2000 mg | INTRAMUSCULAR | Status: DC | PRN

## 2018-06-24 MED ORDER — POLYVINYL ALCOHOL 1.4 % OP SOLN
1.0000 [drp] | Freq: Four times a day (QID) | OPHTHALMIC | Status: DC | PRN
  Filled 2018-06-24: qty 15

## 2018-06-24 MED ORDER — BIOTENE DRY MOUTH MT LIQD: 15.0000 mL | OROMUCOSAL | Status: DC | PRN

## 2018-06-24 MED ORDER — HALOPERIDOL 0.5 MG PO TABS
0.5000 mg | ORAL_TABLET | ORAL | Status: DC | PRN
  Filled 2018-06-24: qty 1

## 2018-06-24 NOTE — Progress Notes (Signed)
   06/24/18 0900  Clinical Encounter Type  Visited With Patient  Visit Type Follow-up  Referral From Chaplain  Chaplain rec'd call from unit 445 PM on Monday. Chaplain also rec'd a referral from Director of [place] that pt wished to complete Advanced Directive. Son had provided fax number wishing to receive copy of AD when signed.  Chaplain arranged for AD to be signed and witnessed with notary, and faxed copy to son and had it placed in chart.  Will refer patient to unit chaplain for any needed follow-up Tamsen Snider, Dept of [place] Pager [phone]

## 2018-06-24 NOTE — Plan of Care (Signed)
  Problem: Education: Goal: Knowledge of General Education information will improve Description Including pain rating scale, medication(s)/side effects and non-pharmacologic comfort measures Outcome: Progressing   Problem: Coping: Goal: Level of anxiety will decrease Outcome: Progressing   Problem: Pain Managment: Goal: General experience of comfort will improve Outcome: Progressing   Problem: Pain Management: Goal: Satisfaction with pain management regimen will improve Outcome: Progressing  Forestine Chute, RN 06/24/2018

## 2018-06-24 NOTE — Consult Note (Signed)
Consultation Note Date: 06/24/2018   Patient Name: April Mueller  DOB: 05/28/1946  MRN: [id number]  Age / Sex: 72 y.o., female  PCP: April Cruel, MD Referring Physician: Lady Deutscher, MD  Reason for Consultation: Non pain symptom management, Pain control, Psychosocial/spiritual support and Terminal Care  HPI/Patient Profile: 72 y.o. female  with past medical history of advanced diastolic heart failure, valvular disease, end stage renal disease who was admitted on 06/18/2018 with shortness of breath, 30 lb weight gain, and increased edema.  Critical care admitted her and determined her goal was comfort care rather than aggressive intervention.  PMT was called to assist with symptom management and care and end of life.   Clinical Assessment and Goals of Care:  I have reviewed medical records including EPIC notes, labs and imaging, received report from April Mueller, assessed the patient and then spoke on the phone with her son April Mueller  to discuss diagnosis prognosis, Mono, EOL wishes, disposition and options.  Spoke with Chaplain service outside of the room.  Her HCPOA had just been completed and was going to be faxed to [facility].  We discussed her current illness and what it means in the larger context of their on-going co-morbidities.  Natural disease trajectory and expectations at EOL were discussed.  I updated April Mueller about his mother's condition.  She is not speaking with me this morning and has no interest in eating.  She does appear comfortable.  April Mueller was surprised.  He stated when he left at 9 pm last night she was asking for things and eating.  We discussed the expected prognosis of a person who stops dialysis and that this prognosis is shorter in someone with multiple other co morbidities such as advanced heart failure.  April Mueller is clear that his mother is dying.  He accepted the information  that she could pass at any time between now and potentially up to two weeks from now.  Hospice and Palliative Care services outpatient were explained and offered.  April Mueller informed me that his mother had talked with the doctor and chosen to live out the end of her life here at [facility].    Questions and concerns were addressed.   The family was encouraged to call with questions or concerns.    Primary Decision Maker:  HCPOA Son April Mueller     SUMMARY OF RECOMMENDATIONS     Discussed with April Mueller  Full comfort measures.  Wean oxygen using dilaudid boluses.  Increased frequency and dose range of dilaudid boluses available.  April Mueller is an excellent candidate for [facility] and would likely received more specialized care and emotional support there.   Orders adjusted for comfort.   Code Status/Advance Care Planning:  DNR   Additional Recommendations (Limitations, Scope, Preferences):  Full Comfort Care  Palliative Prophylaxis:   Aspiration   Prognosis:  Days given advanced diastolic heart failure and ESRD unable to tolerate dialysis.    Discharge Planning: To Be Determined.  Patient and family are assuming hospital death.  Patient appears to be an excellent Hospice House candidate and would receive excellent care there.      Primary Diagnoses: Present on Admission: . Septic shock ([place])   I have reviewed the medical record, interviewed the patient and family, and examined the patient. The following aspects are pertinent.  Past Medical History:  Diagnosis Date  . Anemia 03/23/15   transfusion  . Aortic aneurysm ([place])   . CHF (congestive heart failure) ([place])   . Chronic lower back pain   . Chronic thoracic aortic dissection (HCC)    S/P repair ascending thoracic aortic dissection with chronic residual dissection involving transverse aortic arch and descending thoracic aorta  . CKD (chronic kidney disease) stage 3, GFR 30-59 ml/min (HCC)    "on  dialysis for 3 months after my aneurysm in 2005" (02/20/2016)  . COPD (chronic obstructive pulmonary disease) ([place])   . Gout   . History of blood transfusion "several"  . Hypertension   . Lupus ([place])   . On home oxygen therapy    "3L; 24/7" (02/20/2016)  . OSA on CPAP   . Pulmonary HTN ([place])   . Rheumatoid arthritis(714.0) 11/06/2012  . S/P aortic dissection repair   . 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 Marital status: Legally Separated    Spouse name: Not on file  . Number of children: 1  . Years of education: Not on file  . Highest education level: Not on file  Occupational History  . Occupation: Disability    Comment: Office Work  Scientific laboratory technician  . Financial resource strain: Not on file  . Food insecurity:    Worry: Not on file    Inability: Not on file  . Transportation needs:    Medical: Not on file    Non-medical: Not on file  Tobacco Use  . Smoking status: Former Smoker    Packs/day: 1.50    Years: 14.00    Pack years: 21.00    Types: Cigarettes    Last attempt to quit: 08/09/2003    Years since quitting: 14.8  . Smokeless tobacco: Never Used  Substance and Sexual Activity  . Alcohol use: Yes    Alcohol/week: 0.0 standard drinks    Comment: 02/20/2016 "drank a little in my teens"  . Drug use: No  . Sexual activity: Never  Lifestyle  . Physical activity:    Days per week: Not on file    Minutes per session: Not on file  . Stress: Not on file  Relationships  . Social connections:    Talks on phone: Not on file    Gets together: Not on file    Attends religious service: Not on file    Active member of club or organization: Not on file    Attends meetings of clubs or organizations: Not on file    Relationship status: Not on file  Other Topics Concern  . Not on file  Social History Narrative  . Not on file   Family History  Problem Relation Age of Onset  . Heart attack Mother   . Prostate cancer Father   .  Diabetes Brother   . Kidney failure Brother   . Breast cancer Maternal Aunt   . Colon cancer Neg Hx   . Stomach cancer Neg Hx    Scheduled Meds: . Chlorhexidine Gluconate Cloth  6 each Topical Q0600  . mupirocin ointment  1 application Nasal BID   Continuous Infusions:  PRN Meds:.acetaminophen, albuterol, HYDROmorphone (DILAUDID) injection, LORazepam, simethicone No Known Allergies Review of Systems patient unable to discuss  Physical Exam awake, but keeps eyes shut, nods or shakes her head to answer questions. CV loud and irregular Resp no distress on 3.5 L abdomen soft Lower ext 2-3+ edema, soft  Vital Signs: BP (!) 78/51 (BP Location: Right Arm)   Pulse 85   Temp 98.6 F (37 C) (Oral)   Resp (!) 24   Wt 68.1 kg   SpO2 100%   BMI 25.77 kg/m  Pain Scale: 0-10   Pain Score: 10-Worst pain ever   SpO2: SpO2: 100 % O2 Device:SpO2: 100 % O2 Flow Rate: .O2 Flow Rate (L/min): 4 L/min  IO: Intake/output summary:   Intake/Output Summary (Last 24 hours) at 06/24/2018 0926 Last data filed at 06/24/2018 0552 Gross per 24 hour  Intake 300 ml  Output 0 ml  Net 300 ml    LBM: Last BM Date: 06/23/18 Baseline Weight: Weight: 69.7 kg Most recent weight: Weight: 68.1 kg     Palliative Assessment/Data: 10%     Time In: 9:00 Time Out: 9:50 Time Total: 50 min. Greater than 50%  of this time was spent counseling and coordinating care related to the above assessment and plan.  Signed by: Florentina Jenny, PA-C Palliative Medicine Pager: [phone]  Please contact Palliative Medicine Team phone at [phone] for questions and concerns.  For individual provider: See Shea Evans

## 2018-06-24 NOTE — Progress Notes (Signed)
   06/24/18 0900  Clinical Encounter Type  Visited With Patient  Visit Type Follow-up  Referral From [place] (For Healthcare)  Does Patient Have a Medical Advance Directive? Yes  Chaplain rec'd both call from unit and referral from Director of [place] that pt desired to complete an Advanced Directive.  Son, who had been present, gave the Director his fax number to have a copy of  AD sent directly to him. Chaplain completed AD and had it placed in chart and sent to son.  Will refer pt to unit chaplain for any needed followup [place] Pager [phone]

## 2018-06-24 NOTE — Progress Notes (Signed)
PROGRESS NOTE    April Mueller  XFG:[id number] DOB: Feb 14, 1946 DOA: 06/12/2018 PCP: Lawerance Cruel, MD   Brief Narrative:  72 year old with past medical history relevant for COPD with chronic hypoxic respiratory failure on 2 L, ESRD on PD started 1 week ago (not a candidate for intermittent hemodialysis due to severe RV failure and hypertension) 6, severe mitral regurgitation status post mitral clip on 02/01/2018, obstructive sleep apnea, chronic grade 3 diastolic dysfunction, hypoproliferative anemia, gout, recurrent GI bleeding, hyperlipidemia initially admitted with mixed cardiogenic and septic shockadmitted to the ICU and has transitioned to comfort care.   Assessment & Plan:   Active Problems:   Renal failure treated with peritoneal dialysis ([place])   Septic shock (HCC)   RVF (right ventricular failure) ([place])   Cardiogenic shock (HCC)   Pressure injury of skin   Acute on chronic congestive heart failure ([place])   Cardiorenal syndrome   Palliative care encounter   #) Comfort care: Due to the terminal nature of patient's multiple organ failure she is not a candidate for intermittent hemodialysis as she would not tolerate it. -Palliative care consult pending - Continue hydromorphone for pain and lorazepam for anxiety  #) COPD/chronic hypoxic respiratory failure: -Continue home oxygen -Discontinue home bronchodilators yesterday but patient complaining of dyspnea and therefore will restart PRN -- cool room down  --fan offered but pt declines  #) ESRD on PD: Apparently patient failed PD. She is not a candidate for intermittent hemodialysis due to her hypotension. - Per above  #) Status post mitral clip/RV failure/grade 3 diastolic dysfunction: Currently not being treated  #) Hypertension/hyperlipidemia/anemia: Home medications discontinued #) unable to determine etiology of septic shock #) pressure ulcer of right and left heel: present on admission  Fluids:  Tolerating p.o. Electrodes: Stop lab checks Nutrition: P.o. ad lib.  Prophylaxis: None  Disposition: Pending residential hospice versus death in the hospital  DNR  Subjective: Patient completely different today from yesterday.  She is lying in bed she has no energy to eat.  She does not open her eyes but she does answer questions with one-word responses.  Objective: Vitals:   06/23/18 1336 06/23/18 1910 06/24/18 0549 06/24/18 0800  BP: (!) 70/47  (!) 78/55 (!) 78/51  Pulse: (!) 54  84 85  Resp: (!) 24  (!) 21 (!) 24  Temp: 98.2 F (36.8 C)  98.2 F (36.8 C) 98.6 F (37 C)  TempSrc: Oral  Oral Oral  SpO2: 100% 98% 99% 100%  Weight:        Intake/Output Summary (Last 24 hours) at 06/24/2018 1414 Last data filed at 06/24/2018 0931 Gross per 24 hour  Intake 200 ml  Output 0 ml  Net 200 ml   Filed Weights   06/24/2018 1848 06/11/2018 1918 06/21/18 0500  Weight: 69.7 kg 67.2 kg 68.1 kg    Examination:  General exam: Appears calm and comfortable  Respiratory system: Clear to auscultation. Respiratory effort normal. Cardiovascular system: S1 & S2 heard, RRR. No JVD, murmurs, rubs, gallops or clicks. No pedal edema. Gastrointestinal system: Abdomen is distended, firm and nontender. No organomegaly or masses felt. Normal bowel sounds heard. Central nervous system: Alert and oriented. No focal neurological deficits. Extremities: Dependent edema Skin: No rashes, lesions or ulcers Psychiatry: Judgement and insight appear normal. Mood & affect appropriate.     Data Reviewed: I have personally reviewed following labs and imaging studies  CBC: Recent Labs  Lab 06/21/2018 1307 06/17/2018 1315 06/21/18 0230  WBC 11.5*  --  12.7*  NEUTROABS 10.8*  --   --   HGB 10.6* 12.9 10.0*  HCT 36.0 38.0 34.4*  MCV 93.8  --  92.0  PLT PLATELET CLUMPS NOTED ON SMEAR, UNABLE TO ESTIMATE  --  85*   Basic Metabolic Panel: Recent Labs  Lab 06/24/2018 1307 07/04/2018 1315 06/21/18 0230  NA  136 133* 133*  K 2.6* 3.0* 2.8*  CL 94* 94* 95*  CO2 25  --  25  GLUCOSE 150* 150* 72  BUN 47* 68* 51*  CREATININE 6.48* 6.10* 6.59*  CALCIUM 9.4  --  9.1   GFR: Estimated Creatinine Clearance: 7.5 mL/min (A) (by C-G formula based on SCr of 6.59 mg/dL (H)). Liver Function Tests: Recent Labs  Lab 06/08/2018 1307 06/21/18 0230  AST 37 27  ALT 22 19  ALKPHOS 79 77  BILITOT 0.8 0.7  PROT 5.8* 5.3*  ALBUMIN 1.9* 1.7*   Recent Labs  Lab 06/14/2018 1307  LIPASE 22   No results for input(s): AMMONIA in the last 168 hours. Coagulation Profile: Recent Labs  Lab 06/21/2018 1307  INR 1.13   CBG: Recent Labs  Lab 06/19/2018 1659  GLUCAP 105*  Sepsis Labs: Recent Labs  Lab 07/04/2018 1316 06/18/2018 1448  LATICACIDVEN 5.00* 2.48*    Recent Results (from the past 240 hour(s))  Culture, blood (routine x 2)     Status: None (Preliminary result)   Collection Time: 07/03/2018  1:07 PM  Result Value Ref Range Status   Specimen Description BLOOD BLOOD LEFT FOREARM  Final   Special Requests   Final    BOTTLES DRAWN AEROBIC AND ANAEROBIC Blood Culture adequate volume   Culture   Final    NO GROWTH 3 DAYS Performed at [facility], [place] [address], [place], [place] [address]    Report Status PENDING  Incomplete  Culture, blood (routine x 2)     Status: None (Preliminary result)   Collection Time: 06/08/2018  1:07 PM  Result Value Ref Range Status   Specimen Description BLOOD BLOOD RIGHT FOREARM  Final   Special Requests   Final    BOTTLES DRAWN AEROBIC AND ANAEROBIC Blood Culture adequate volume   Culture   Final    NO GROWTH 3 DAYS Performed at [facility], [place] [address], [place], [place] [address]    Report Status PENDING  Incomplete  MRSA PCR Screening     Status: Abnormal   Collection Time: 07/03/2018  4:03 PM  Result Value Ref Range Status   MRSA by PCR POSITIVE (A) NEGATIVE Final    Comment:        The GeneXpert MRSA Assay (FDA approved for NASAL  specimens only), is one component of a comprehensive MRSA colonization surveillance program. It is not intended to diagnose MRSA infection nor to guide or monitor treatment for MRSA infections. RESULT CALLED TO, READ BACK BY AND VERIFIED WITH: Shelbie Hutching RN [phone] GF Performed at [facility], [place] [address] [place], [place] [address]      Radiology Studies: No results found.  Scheduled Meds: . Chlorhexidine Gluconate Cloth  6 each Topical Q0600  . mupirocin ointment  1 application Nasal BID   Continuous Infusions:   LOS: 4 days    Time spent: 25 min    Lady Deutscher, MD FACP [facility] Pager [phone]  If 7PM-7AM, please contact night-coverage [email or web address] Password [email or web address] 06/24/2018, 2:14 PM

## 2018-06-25 DIAGNOSIS — Z515 Encounter for palliative care: Secondary | ICD-10-CM

## 2018-06-25 LAB — CULTURE, BLOOD (ROUTINE X 2)
Culture: NO GROWTH
Culture: NO GROWTH
SPECIAL REQUESTS: ADEQUATE
Special Requests: ADEQUATE

## 2018-06-25 MED ORDER — LORAZEPAM 2 MG/ML IJ SOLN
0.2500 mg | Freq: Two times a day (BID) | INTRAMUSCULAR | Status: DC
  Administered 2018-06-25 (×2): 0.25 mg via INTRAVENOUS
  Filled 2018-06-25 (×2): qty 1

## 2018-06-25 NOTE — Progress Notes (Signed)
Family member called to get an update on the Pt's status, however did not have the pass code for the Pt, it was explained to the family member that we could not give information out to them due HIPPA protection, with out the pass code.  Family member was upset and side they were going to come to hospital and get the information.  I phoned the Pt's son and spoke to him about this.  He said he set the Pass code up and would get a hold of the family member and discuss with them and advise that all updates will come through him.

## 2018-06-25 NOTE — Progress Notes (Signed)
Daily Progress Note   Patient Name: April Mueller       Date: 06/25/2018 DOB: 16-Jan-1946  Age: 72 y.o. MRN#: [id number] Attending Physician: Kayleen Memos, DO Primary Care Physician: Lawerance Cruel, MD Admit Date: 06/13/2018  Reason for Consultation/Follow-up: Psychosocial/spiritual support and Terminal Care  Subjective: Visited with patient at bedside.  She will answer questions in a weak whisper.  She is difficult to understand but seems alert/appropriate.  I asked her about going to hospice for a less stressful environment and more support.  She grimaced and told me "no".  The question seemed to cause her some distress.  I sat with her a while longer then discussed her case with the bedside RN.  RN felt as I did that the patient seemed anxious.  Pt denies pain and discomfort.   Assessment: Patient with ESRD and Advanced HF who can no longer tolerate dialysis.  PMT was consulted after goals were established and the decision had been made with family for her to remain here.  Patient has anxiety associated with current circumstances.     Patient Profile/HPI:  72 y.o. female  with past medical history of advanced diastolic heart failure, valvular disease, end stage renal disease who was admitted on 06/19/2018 with shortness of breath, 30 lb weight gain, and increased edema.  Critical care admitted her and determined her goal was comfort care rather than aggressive intervention.  PMT was called to assist with symptom management and care and end of life.      Length of Stay: 5  Current Medications: Scheduled Meds:  . Chlorhexidine Gluconate Cloth  6 each Topical Q0600  . LORazepam  0.25 mg Intravenous BID  . mupirocin ointment  1 application Nasal BID    Continuous  Infusions:   PRN Meds: acetaminophen, albuterol, antiseptic oral rinse, glycopyrrolate **OR** glycopyrrolate **OR** glycopyrrolate, haloperidol **OR** haloperidol **OR** haloperidol lactate, HYDROmorphone (DILAUDID) injection, LORazepam, polyvinyl alcohol, simethicone  Physical Exam        Well developed female, appears weak, speaks in a soft whisper.  Eyes closed. CV irreg irreg Resp no distress on N/C at 1L Abdomen soft, nt, nd   Vital Signs: BP (!) 71/41 (BP Location: Right Arm) Comment: nurse notified  Pulse 71   Temp 97.7 F (36.5 C) (Oral)   Resp (!) 22  Wt 68.1 kg   SpO2 91%   BMI 25.77 kg/m  SpO2: SpO2: 91 % O2 Device: O2 Device: Nasal Cannula O2 Flow Rate: O2 Flow Rate (L/min): 1 L/min  Intake/output summary:   Intake/Output Summary (Last 24 hours) at 06/25/2018 1505 Last data filed at 06/25/2018 1330 Gross per 24 hour  Intake 180 ml  Output 0 ml  Net 180 ml   LBM: Last BM Date: 06/23/18 Baseline Weight: Weight: 69.7 kg Most recent weight: Weight: 68.1 kg       Palliative Assessment/Data: 10%      Patient Active Problem List   Diagnosis Date Noted  . Acute on chronic congestive heart failure ([place])   . Cardiorenal syndrome   . Palliative care encounter   . Septic shock ([place]) 06/08/2018  . Pressure injury of skin 06/07/2018  . RVF (right ventricular failure) ([place])   . Cardiogenic shock ([place])   . AKI (acute kidney injury) ([place])   . Chronic atrial fibrillation   . Hypoxemia   . Acute respiratory failure with hypoxemia ([place])   . Respiratory failure ([place])   . Acute pulmonary edema (HCC)   . Acute on chronic systolic heart failure ([place]) 01/29/2018  . Severe mitral regurgitation 01/29/2018  . S/P aortic dissection repair   . Chronic thoracic aortic dissection (HCC)   . PAF (paroxysmal atrial fibrillation) ([place]) 11/06/2016  . Sepsis due to Escherichia coli ([place])   . Sepsis ([place]) 07/12/2016  . Thrombocytopenia ([place]) 07/12/2016  . Urinary tract  infection with hematuria   . Fever   . Hypotension   . Iron deficiency anemia due to chronic blood loss 11/03/2015  . Chronic diastolic CHF (congestive heart failure) ([place]) 09/26/2015  . Persistent atrial fibrillation   . History of colonic polyps   . Benign neoplasm of cecum   . Benign neoplasm of ascending colon   . Heme positive stool   . Anemia 08/17/2015  . Symptomatic anemia 08/16/2015  . HTN (hypertension) 08/16/2015  . Leukocytosis 08/16/2015  . Acute hyperkalemia 08/16/2015  . 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 Anemia of chronic disease 04/26/2015  . CKD (chronic kidney disease) stage 3, GFR 30-59 ml/min (HCC) 04/22/2015  . Normocytic anemia 04/04/2015  . Morbid obesity ([place]) 01/22/2015  . Mitral regurgitation 12/29/2014  . Dyspnea 08/18/2014  . Chronic respiratory failure with hypoxia ([place]) 08/09/2014  . 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 Rheumatoid arthritis ([place]) 11/06/2012  . COPD with chronic bronchitis and emphysema ([place]) 11/06/2012  . OSA on CPAP 11/06/2012  . Renal failure treated with peritoneal dialysis ([place]) 11/06/2012    Palliative Care Plan    Recommendations/Plan:  Patient on comfort measures only.    On dilaudid PRN for pain/dyspnea.  She has received a reasonable amount of PRNs - does not appear to need an increase in opioid medication at this point.  Will add ativan 0.25 bid scheduled.  PMT will follow with you for on-going symptom management.  Goals of Care and Additional Recommendations:  Limitations on Scope of Treatment: Full Comfort Care  Code Status:  DNR  Prognosis:  days  Discharge Planning:  Anticipated Hospital Death  Care plan was discussed with RN  Thank you for allowing the Palliative Medicine Team to assist in the care of this patient.  Total time spent:  15 min     Greater than 50%  of this time was spent counseling and coordinating care related to the above assessment and  plan.  Florentina Jenny, PA-C  Palliative Medicine  Please contact Palliative MedicineTeam phone at [phone] for questions and concerns between 7 am - 7 pm.   Please see AMION for individual provider pager numbers.

## 2018-06-25 NOTE — Care Management Note (Signed)
Case Management Note  Patient Details  Name: April Mueller MRN: [id number] Date of Birth: [birth year]  Subjective/Objective:                    Action/Plan:  Continuing to follow for discharge needs  Expected Discharge Date:                  Expected Discharge Plan:     In-House Referral:  Clinical Social Work, Hospice / Palliative Care  Discharge planning Services  CM Consult  Post Acute Care Choice:    Choice offered to:     DME Arranged:    DME Agency:     HH Arranged:    [place] Agency:     Status of Service:  In process, will continue to follow  If discussed at Long Length of Stay Meetings, dates discussed:    Additional Comments:  Marilu Favre, RN 06/25/2018, 10:32 AM

## 2018-06-25 NOTE — Progress Notes (Signed)
PROGRESS NOTE  NARDOS PUTNAM ZYS:[id number] DOB: May 05, 1946 DOA: 06/29/2018 PCP: Lawerance Cruel, MD  HPI/Recap of past 43 hours: 72 year old with past medical history relevant for COPD with chronic hypoxic respiratory failure on 2 L, ESRD on PD started 1 week ago (not a candidate for intermittent hemodialysis due to severe RV failure and hypertension) 6, severe mitral regurgitation status post mitral clip on 02/01/2018, obstructive sleep apnea, chronic grade 3 diastolic dysfunction, hypoproliferative anemia, gout, recurrent GI bleeding, hyperlipidemiainitiallyadmitted with mixed cardiogenic and septic shockadmitted to the ICU and has transitioned to comfort care.  06/25/2018: Patient seen and examined at her bedside.  Appears comfortable.   Assessment/Plan: Active Problems:   Renal failure treated with peritoneal dialysis ([place])   Septic shock (HCC)   RVF (right ventricular failure) ([place])   Cardiogenic shock (HCC)   Pressure injury of skin   Acute on chronic congestive heart failure ([place])   Cardiorenal syndrome   Palliative care encounter  #) Comfort care: Due to the terminal nature of patient's multiple organ failure she is not a candidate for intermittent hemodialysis as she would not tolerate it. -Palliative care consultpending -Continue hydromorphone for pain and lorazepam for anxiety  #) COPD/chronic hypoxic respiratory failure: -Continue home oxygen -Discontinue home bronchodilatorsyesterday but patient complaining of dyspnea and therefore will restart PRN -- cool room down  --fan offered but pt declines  #) ESRD on PD: Apparently patient failed PD. She is not a candidate for intermittent hemodialysis due to her hypotension. -Per above  #) Status post mitral clip/RV failure/grade 3 diastolic dysfunction: Currently not being treated  #) Hypertension/hyperlipidemia/anemia: Home medications discontinued #) unable to determine etiology of septic shock #)  pressure ulcer of right and left heel: present on admission  Fluids: Tolerating p.o. Electrodes: Stop lab checks Nutrition: P.o. ad lib.  Prophylaxis: None  Disposition: Pending residential hospice versus death in the hospital  DNR    Objective: Vitals:   06/23/18 1910 06/24/18 0549 06/24/18 0800 06/25/18 1416  BP:  (!) 78/55 (!) 78/51 (!) 71/41  Pulse:  84 85 71  Resp:  (!) 21 (!) 24 (!) 22  Temp:  98.2 F (36.8 C) 98.6 F (37 C) 97.7 F (36.5 C)  TempSrc:  Oral Oral Oral  SpO2: 98% 99% 100% 91%  Weight:        Intake/Output Summary (Last 24 hours) at 06/25/2018 1516 Last data filed at 06/25/2018 1330 Gross per 24 hour  Intake 180 ml  Output 0 ml  Net 180 ml   Filed Weights   06/07/2018 1848 06/06/2018 1918 06/21/18 0500  Weight: 69.7 kg 67.2 kg 68.1 kg    Exam:  . General: 72 y.o. year-old female well developed well nourished in no acute distress.  Somnolent without interaction.  Appears comfortable.   . Cardiovascular: Regular rate and rhythm with no rubs or gallops.  No thyromegaly or JVD noted.   [name] Respiratory: Mild rales anteriorly.  Poor inspiratory effort. . Abdomen: Soft nontender nondistended with hypoactive bowel sounds x4 quadrants.  Left upper quadrant surgical drain in place. [name] Psychiatry: Mood is appropriate for condition and setting   Data Reviewed: CBC: Recent Labs  Lab 06/28/2018 1307 06/09/2018 1315 06/21/18 0230  WBC 11.5*  --  12.7*  NEUTROABS 10.8*  --   --   HGB 10.6* 12.9 10.0*  HCT 36.0 38.0 34.4*  MCV 93.8  --  92.0  PLT PLATELET CLUMPS NOTED ON SMEAR, UNABLE TO ESTIMATE  --  85*  Basic Metabolic Panel: Recent Labs  Lab 06/08/2018 1307 06/19/2018 1315 06/21/18 0230  NA 136 133* 133*  K 2.6* 3.0* 2.8*  CL 94* 94* 95*  CO2 25  --  25  GLUCOSE 150* 150* 72  BUN 47* 68* 51*  CREATININE 6.48* 6.10* 6.59*  CALCIUM 9.4  --  9.1   GFR: Estimated Creatinine Clearance: 7.5 mL/min (A) (by C-G formula based on SCr of 6.59 mg/dL  (H)). Liver Function Tests: Recent Labs  Lab 06/22/2018 1307 06/21/18 0230  AST 37 27  ALT 22 19  ALKPHOS 79 77  BILITOT 0.8 0.7  PROT 5.8* 5.3*  ALBUMIN 1.9* 1.7*   Recent Labs  Lab 06/29/2018 1307  LIPASE 22   No results for input(s): AMMONIA in the last 168 hours. Coagulation Profile: Recent Labs  Lab 07/02/2018 1307  INR 1.13   Cardiac Enzymes: No results for input(s): CKTOTAL, CKMB, CKMBINDEX, TROPONINI in the last 168 hours. BNP (last 3 results) No results for input(s): PROBNP in the last 8760 hours. HbA1C: No results for input(s): HGBA1C in the last 72 hours. CBG: Recent Labs  Lab 06/11/2018 1659  GLUCAP 105*   Lipid Profile: No results for input(s): CHOL, HDL, LDLCALC, TRIG, CHOLHDL, LDLDIRECT in the last 72 hours. Thyroid Function Tests: No results for input(s): TSH, T4TOTAL, FREET4, T3FREE, THYROIDAB in the last 72 hours. Anemia Panel: No results for input(s): VITAMINB12, FOLATE, FERRITIN, TIBC, IRON, RETICCTPCT in the last 72 hours. Urine analysis:    Component Value Date/Time   COLORURINE YELLOW 01/27/2018 [place] 01/27/2018 0954   LABSPEC 1.008 01/27/2018 0954   PHURINE 8.0 01/27/2018 0954   GLUCOSEU NEGATIVE 01/27/2018 0954   HGBUR NEGATIVE 01/27/2018 0954   BILIRUBINUR NEGATIVE 01/27/2018 0954   KETONESUR NEGATIVE 01/27/2018 0954   PROTEINUR NEGATIVE 01/27/2018 0954   NITRITE NEGATIVE 01/27/2018 0954   LEUKOCYTESUR MODERATE (A) 01/27/2018 0954   Sepsis Labs: @LABRCNTIP (procalcitonin:4,lacticidven:4)  ) Recent Results (from the past 240 hour(s))  Culture, blood (routine x 2)     Status: None   Collection Time: 06/13/2018  1:07 PM  Result Value Ref Range Status   Specimen Description BLOOD BLOOD LEFT FOREARM  Final   Special Requests   Final    BOTTLES DRAWN AEROBIC AND ANAEROBIC Blood Culture adequate volume   Culture   Final    NO GROWTH 5 DAYS Performed at [facility], [place] [address], [place], [place] [address]     Report Status 06/25/2018 FINAL  Final  Culture, blood (routine x 2)     Status: None   Collection Time: 06/06/2018  1:07 PM  Result Value Ref Range Status   Specimen Description BLOOD BLOOD RIGHT FOREARM  Final   Special Requests   Final    BOTTLES DRAWN AEROBIC AND ANAEROBIC Blood Culture adequate volume   Culture   Final    NO GROWTH 5 DAYS Performed at [facility], [place] [address], [place], [place] [address]    Report Status 06/25/2018 FINAL  Final  MRSA PCR Screening     Status: Abnormal   Collection Time: 06/17/2018  4:03 PM  Result Value Ref Range Status   MRSA by PCR POSITIVE (A) NEGATIVE Final    Comment:        The GeneXpert MRSA Assay (FDA approved for NASAL specimens only), is one component of a comprehensive MRSA colonization surveillance program. It is not intended to diagnose MRSA infection nor to guide or monitor treatment for  MRSA infections. RESULT CALLED TO, READ BACK BY AND VERIFIED WITH: Shelbie Hutching RN [phone] Performed at [facility], [place] [address], [place], [place] [address]       Studies: No results found.  Scheduled Meds: . Chlorhexidine Gluconate Cloth  6 each Topical Q0600  . LORazepam  0.25 mg Intravenous BID  . mupirocin ointment  1 application Nasal BID    Continuous Infusions:   LOS: 5 days     Kayleen Memos, MD [facility] Pager [phone]  If 7PM-7AM, please contact night-coverage [email or web address] Password [facility] 06/25/2018, 3:16 PM

## 2018-06-25 NOTE — Social Work (Signed)
CSW acknowledging consult for comfort care. Will follow for disposition should hospice services or residential hospice become appropriate.   Shala Baumbach H Faron Tudisco, LCSWA [place] Clinical Social Work [phone]   

## 2018-06-26 ENCOUNTER — Telehealth: Payer: Self-pay | Admitting: Physician Assistant

## 2018-07-06 NOTE — Death Summary Note (Signed)
Death Summary  April Mueller XNA:[id number] DOB: 18-Oct-1945 DOA: 2018/07/03  PCP: Lawerance Cruel, MD  Admit date: 07-03-18 Date of Death: 07-09-2018 Time of Death: 0800 Notification: Lawerance Cruel, MD notified of death of July 09, 2018   History of present illness:  April Mueller is a 72 y.o. female with past medical history for COPD with chronic hypoxic respiratory failure on 2 L, ESRD on PD started 1 week ago (not a candidate for intermittent hemodialysis due to severe RV failure and hypertension), severe mitral regurgitation status post mitral clip on 02/01/2018, obstructive sleep apnea, chronic grade 3 diastolic dysfunction, hypoproliferative anemia, gout, recurrent GI bleeding, hyperlipidemiainitiallyadmitted for cardiogenic and septic shock to the ICU andhas transitionedto comfort care.   2018-07-09: Patient was pronounced at 0800. Her son April Mueller was called and made aware of his mom passing.   April Mueller did not improve after failed PD. Had multiple organ failure. Was made comfort care.  Final Diagnoses:  1.   Cardiopulmonary arrest 2.    ESRD    The results of significant diagnostics from this hospitalization (including imaging, microbiology, ancillary and laboratory) are listed below for reference.    Significant Diagnostic Studies: Dg Chest Port 1 View  Result Date: 2018/07/03 CLINICAL DATA:  Shortness of breath. EXAM: PORTABLE CHEST 1 VIEW COMPARISON:  02/06/2018 and 02/02/2018 and 01/27/2018 FINDINGS: There is chronic marked cardiomegaly. Pulmonary vascularity is within normal limits. Slight chronic thickening of the fissures on the right. No discrete infiltrates or effusions. No acute bone abnormality. Chronic degenerative changes of both shoulders. IMPRESSION: No acute abnormality.  Chronic cardiomegaly. Electronically Signed   By: Lorriane Shire M.D.   On: 03-Jul-2018 13:26    Microbiology: Recent Results (from the past 240 hour(s))    Culture, blood (routine x 2)     Status: None   Collection Time: 07/03/18  1:07 PM  Result Value Ref Range Status   Specimen Description BLOOD BLOOD LEFT FOREARM  Final   Special Requests   Final    BOTTLES DRAWN AEROBIC AND ANAEROBIC Blood Culture adequate volume   Culture   Final    NO GROWTH 5 DAYS Performed at [facility], [address] [place], [place] [address]    Report Status 06/25/2018 FINAL  Final  Culture, blood (routine x 2)     Status: None   Collection Time: 2018-07-03  1:07 PM  Result Value Ref Range Status   Specimen Description BLOOD BLOOD RIGHT FOREARM  Final   Special Requests   Final    BOTTLES DRAWN AEROBIC AND ANAEROBIC Blood Culture adequate volume   Culture   Final    NO GROWTH 5 DAYS Performed at [facility], [place] [address], [place], [place] [address]    Report Status 06/25/2018 FINAL  Final  MRSA PCR Screening     Status: Abnormal   Collection Time: 03-Jul-2018  4:03 PM  Result Value Ref Range Status   MRSA by PCR POSITIVE (A) NEGATIVE Final    Comment:        The GeneXpert MRSA Assay (FDA approved for NASAL specimens only), is one component of a comprehensive MRSA colonization surveillance program. It is not intended to diagnose MRSA infection nor to guide or monitor treatment for MRSA infections. RESULT CALLED TO, READ BACK BY AND VERIFIED WITH: Shelbie Hutching RN [id number] Performed at [facility], [place] [address], [place], [place] [address]      Labs: Basic Metabolic  Panel: Recent Labs  Lab 07/04/2018 1307 06/19/2018 1315 06/21/18 0230  NA 136 133* 133*  K 2.6* 3.0* 2.8*  CL 94* 94* 95*  CO2 25  --  25  GLUCOSE 150* 150* 72  BUN 47* 68* 51*  CREATININE 6.48* 6.10* 6.59*  CALCIUM 9.4  --  9.1   Liver Function Tests: Recent Labs  Lab 06/10/2018 1307 06/21/18 0230  AST 37 27  ALT 22 19  ALKPHOS 79 77  BILITOT 0.8 0.7  PROT 5.8* 5.3*  ALBUMIN 1.9* 1.7*   Recent Labs  Lab 06/23/2018 1307  LIPASE 22   No  results for input(s): AMMONIA in the last 168 hours. CBC: Recent Labs  Lab 06/09/2018 1307 06/12/2018 1315 06/21/18 0230  WBC 11.5*  --  12.7*  NEUTROABS 10.8*  --   --   HGB 10.6* 12.9 10.0*  HCT 36.0 38.0 34.4*  MCV 93.8  --  92.0  PLT PLATELET CLUMPS NOTED ON SMEAR, UNABLE TO ESTIMATE  --  85*   Cardiac Enzymes: No results for input(s): CKTOTAL, CKMB, CKMBINDEX, TROPONINI in the last 168 hours. D-Dimer No results for input(s): DDIMER in the last 72 hours. BNP: Invalid input(s): POCBNP CBG: Recent Labs  Lab 06/25/2018 1659  GLUCAP 105*   Anemia work up No results for input(s): VITAMINB12, FOLATE, FERRITIN, TIBC, IRON, RETICCTPCT in the last 72 hours. Urinalysis    Component Value Date/Time   COLORURINE YELLOW 01/27/2018 0954   APPEARANCEUR CLEAR 01/27/2018 0954   LABSPEC 1.008 01/27/2018 0954   PHURINE 8.0 01/27/2018 0954   GLUCOSEU NEGATIVE 01/27/2018 0954   HGBUR NEGATIVE 01/27/2018 0954   BILIRUBINUR NEGATIVE 01/27/2018 0954   KETONESUR NEGATIVE 01/27/2018 0954   PROTEINUR NEGATIVE 01/27/2018 0954   NITRITE NEGATIVE 01/27/2018 0954   LEUKOCYTESUR MODERATE (A) 01/27/2018 0954   Sepsis Labs Invalid input(s): PROCALCITONIN,  WBC,  LACTICIDVEN     SIGNED:  Kayleen Memos, MD  [facility] Jul 16, 2018, 10:52 AM Pager   If 7PM-7AM, please contact night-coverage [email or web address] Password [email or web address]

## 2018-07-06 NOTE — Telephone Encounter (Signed)
entered in error   

## 2018-07-06 NOTE — Progress Notes (Signed)
Notified at 0830 AM that the Pt has expired by MD Notified [facility] Son was notified of Pt's passing Son also notified of picking up the Pt's personal items at security next to the ED when he arrives in [place].

## 2018-07-06 DEATH — deceased

## 2018-10-17 IMAGING — CR DG CHEST 1V PORT SAME DAY
1 series · 1 of 1 positions shown · non-contrast
Comparison: 07/13/2016

CLINICAL DATA: Respiratory failure with hypoxia

EXAM:
PORTABLE CHEST 1 VIEW

[AP]
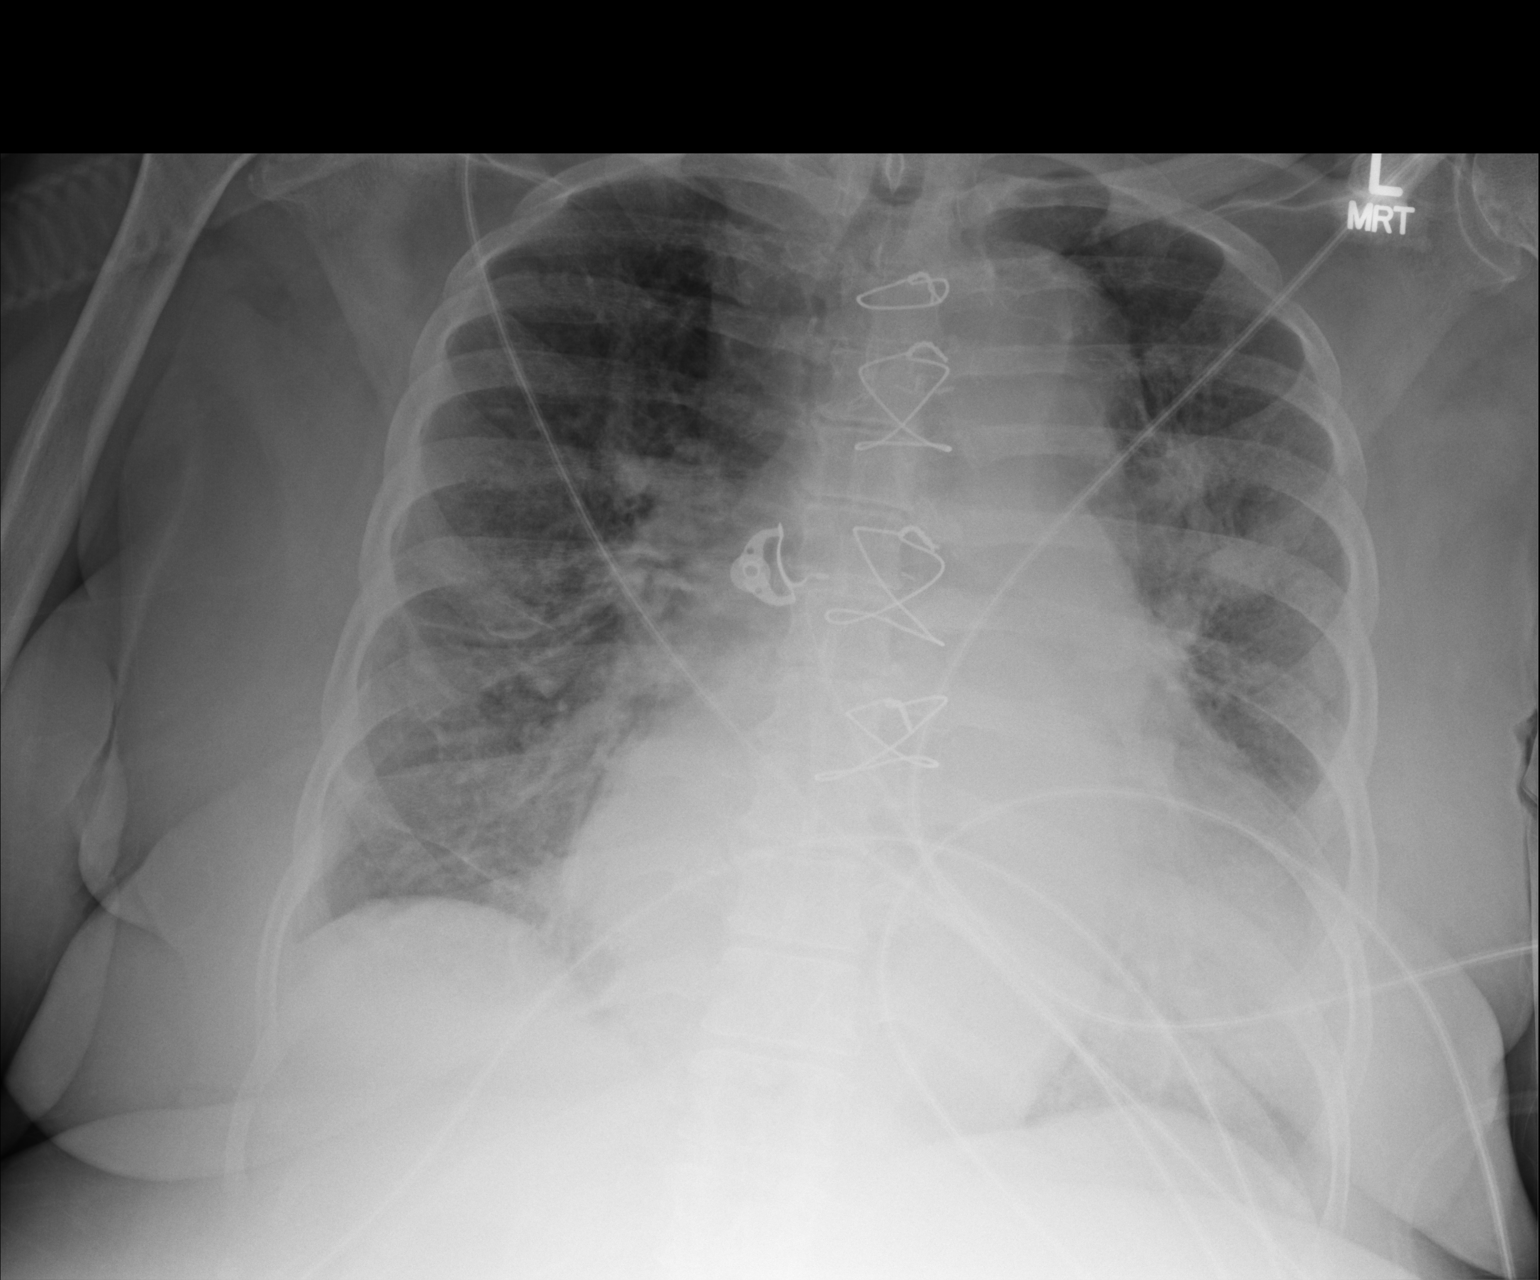

[1 of 1 positions shown; findings below may reference images not displayed]

FINDINGS: Sternotomy wires unchanged. Lungs are adequately inflated
demonstrate patchy bilateral hilar opacification which may be due to
mild interstitial edema, although cannot exclude infection. No
evidence of effusion. Stable cardiomegaly. Remainder of the exam is
unchanged.
IMPRESSION: Patchy perihilar opacification which may be due to mild interstitial
edema versus infection.

Stable cardiomegaly.
